# Patient Record
Sex: Male | Born: 1935 | Race: White | Hispanic: No | Smoking: Former smoker
Health system: Southern US, Community
[De-identification: ages and names within clinical notes are randomized; demographics above are authoritative.]

## PROBLEM LIST (undated history)

## (undated) DIAGNOSIS — I429 Cardiomyopathy, unspecified: Secondary | ICD-10-CM

## (undated) DIAGNOSIS — C3490 Malignant neoplasm of unspecified part of unspecified bronchus or lung: Secondary | ICD-10-CM

## (undated) DIAGNOSIS — E7800 Pure hypercholesterolemia, unspecified: Secondary | ICD-10-CM

## (undated) DIAGNOSIS — Z95810 Presence of automatic (implantable) cardiac defibrillator: Secondary | ICD-10-CM

## (undated) DIAGNOSIS — K219 Gastro-esophageal reflux disease without esophagitis: Secondary | ICD-10-CM

## (undated) DIAGNOSIS — N400 Enlarged prostate without lower urinary tract symptoms: Secondary | ICD-10-CM

## (undated) DIAGNOSIS — I509 Heart failure, unspecified: Secondary | ICD-10-CM

## (undated) DIAGNOSIS — J449 Chronic obstructive pulmonary disease, unspecified: Secondary | ICD-10-CM

## (undated) DIAGNOSIS — I38 Endocarditis, valve unspecified: Secondary | ICD-10-CM

## (undated) DIAGNOSIS — Z8679 Personal history of other diseases of the circulatory system: Secondary | ICD-10-CM

## (undated) DIAGNOSIS — G629 Polyneuropathy, unspecified: Secondary | ICD-10-CM

## (undated) DIAGNOSIS — E039 Hypothyroidism, unspecified: Secondary | ICD-10-CM

## (undated) DIAGNOSIS — I5032 Chronic diastolic (congestive) heart failure: Secondary | ICD-10-CM

## (undated) DIAGNOSIS — Z859 Personal history of malignant neoplasm, unspecified: Secondary | ICD-10-CM

## (undated) DIAGNOSIS — I951 Orthostatic hypotension: Secondary | ICD-10-CM

## (undated) HISTORY — DX: Chronic diastolic (congestive) heart failure: I50.32

## (undated) HISTORY — DX: Hypothyroidism, unspecified: E03.9

## (undated) HISTORY — DX: Heart failure, unspecified: I50.9

## (undated) HISTORY — DX: Cardiomyopathy, unspecified: I42.9

## (undated) HISTORY — DX: Endocarditis, valve unspecified: I38

## (undated) HISTORY — DX: Malignant neoplasm of unspecified part of unspecified bronchus or lung: C34.90

## (undated) HISTORY — DX: Benign prostatic hyperplasia without lower urinary tract symptoms: N40.0

## (undated) HISTORY — DX: Orthostatic hypotension: I95.1

## (undated) HISTORY — DX: Personal history of malignant neoplasm, unspecified: Z85.9

## (undated) HISTORY — DX: Pure hypercholesterolemia, unspecified: E78.00

## (undated) HISTORY — DX: Polyneuropathy, unspecified: G62.9

## (undated) HISTORY — DX: Presence of automatic (implantable) cardiac defibrillator: Z95.810

## (undated) HISTORY — PX: APPENDECTOMY: SHX54

## (undated) HISTORY — DX: Gastro-esophageal reflux disease without esophagitis: K21.9

## (undated) HISTORY — DX: Personal history of other diseases of the circulatory system: Z86.79

## (undated) HISTORY — PX: CARDIAC VALVE REPLACEMENT: SHX585

## (undated) HISTORY — PX: OTHER SURGICAL HISTORY: SHX169

## (undated) HISTORY — PX: PORT-A-CATH REMOVAL: SHX5289

---

## 2014-11-02 DIAGNOSIS — K219 Gastro-esophageal reflux disease without esophagitis: Secondary | ICD-10-CM

## 2014-11-02 DIAGNOSIS — Z95810 Presence of automatic (implantable) cardiac defibrillator: Secondary | ICD-10-CM

## 2014-11-02 DIAGNOSIS — I951 Orthostatic hypotension: Secondary | ICD-10-CM | POA: Insufficient documentation

## 2014-11-02 DIAGNOSIS — I5033 Acute on chronic diastolic (congestive) heart failure: Secondary | ICD-10-CM | POA: Insufficient documentation

## 2014-11-02 DIAGNOSIS — I38 Endocarditis, valve unspecified: Secondary | ICD-10-CM | POA: Insufficient documentation

## 2014-11-02 DIAGNOSIS — E7800 Pure hypercholesterolemia, unspecified: Secondary | ICD-10-CM | POA: Insufficient documentation

## 2014-11-02 DIAGNOSIS — I429 Cardiomyopathy, unspecified: Secondary | ICD-10-CM | POA: Insufficient documentation

## 2014-11-02 DIAGNOSIS — G629 Polyneuropathy, unspecified: Secondary | ICD-10-CM | POA: Insufficient documentation

## 2014-11-02 DIAGNOSIS — I10 Essential (primary) hypertension: Secondary | ICD-10-CM | POA: Insufficient documentation

## 2014-11-02 DIAGNOSIS — Z8679 Personal history of other diseases of the circulatory system: Secondary | ICD-10-CM

## 2014-11-02 DIAGNOSIS — E039 Hypothyroidism, unspecified: Secondary | ICD-10-CM

## 2014-11-02 DIAGNOSIS — E119 Type 2 diabetes mellitus without complications: Secondary | ICD-10-CM | POA: Insufficient documentation

## 2014-11-02 DIAGNOSIS — I5032 Chronic diastolic (congestive) heart failure: Secondary | ICD-10-CM

## 2014-11-02 HISTORY — DX: Pure hypercholesterolemia, unspecified: E78.00

## 2014-11-02 HISTORY — DX: Presence of automatic (implantable) cardiac defibrillator: Z95.810

## 2014-11-02 HISTORY — DX: Chronic diastolic (congestive) heart failure: I50.32

## 2014-11-02 HISTORY — DX: Orthostatic hypotension: I95.1

## 2014-11-02 HISTORY — DX: Personal history of other diseases of the circulatory system: Z86.79

## 2014-11-02 HISTORY — DX: Gastro-esophageal reflux disease without esophagitis: K21.9

## 2014-11-02 HISTORY — DX: Polyneuropathy, unspecified: G62.9

## 2014-11-02 HISTORY — DX: Hypothyroidism, unspecified: E03.9

## 2014-11-02 HISTORY — DX: Endocarditis, valve unspecified: I38

## 2014-11-09 ENCOUNTER — Other Ambulatory Visit: Payer: Self-pay | Admitting: Internal Medicine

## 2014-11-09 DIAGNOSIS — Z859 Personal history of malignant neoplasm, unspecified: Secondary | ICD-10-CM

## 2014-11-09 DIAGNOSIS — Z85118 Personal history of other malignant neoplasm of bronchus and lung: Secondary | ICD-10-CM

## 2014-11-09 DIAGNOSIS — R9389 Abnormal findings on diagnostic imaging of other specified body structures: Secondary | ICD-10-CM

## 2014-11-09 HISTORY — DX: Personal history of malignant neoplasm, unspecified: Z85.9

## 2014-11-14 ENCOUNTER — Ambulatory Visit: Admission: RE | Admit: 2014-11-14 | Payer: Medicare Other | Source: Ambulatory Visit

## 2014-11-22 ENCOUNTER — Encounter: Payer: Self-pay | Admitting: *Deleted

## 2014-11-22 ENCOUNTER — Ambulatory Visit: Payer: Self-pay

## 2014-11-22 ENCOUNTER — Ambulatory Visit (INDEPENDENT_AMBULATORY_CARE_PROVIDER_SITE_OTHER): Payer: Medicare Other | Admitting: Obstetrics and Gynecology

## 2014-11-22 VITALS — BP 112/68 | HR 70 | Temp 97.4°F | Resp 16 | Ht 70.0 in | Wt 154.0 lb

## 2014-11-22 DIAGNOSIS — N400 Enlarged prostate without lower urinary tract symptoms: Secondary | ICD-10-CM | POA: Diagnosis not present

## 2014-11-22 DIAGNOSIS — N35119 Postinfective urethral stricture, not elsewhere classified, male, unspecified: Secondary | ICD-10-CM | POA: Diagnosis not present

## 2014-11-22 LAB — URINALYSIS, COMPLETE
BILIRUBIN UA: NEGATIVE
Glucose, UA: NEGATIVE
KETONES UA: NEGATIVE
LEUKOCYTES UA: NEGATIVE
Nitrite, UA: NEGATIVE
PROTEIN UA: NEGATIVE
RBC UA: NEGATIVE
Specific Gravity, UA: 1.015 (ref 1.005–1.030)
Urobilinogen, Ur: 0.2 mg/dL (ref 0.2–1.0)
pH, UA: 5.5 (ref 5.0–7.5)

## 2014-11-22 LAB — MICROSCOPIC EXAMINATION
Bacteria, UA: NONE SEEN
RBC, UA: NONE SEEN /hpf (ref 0–?)
RENAL EPITHEL UA: NONE SEEN /HPF
WBC, UA: NONE SEEN /hpf (ref 0–?)

## 2014-11-22 LAB — BLADDER SCAN AMB NON-IMAGING

## 2014-11-22 MED ORDER — LIDOCAINE HCL 2 % EX GEL
1.0000 "application " | Freq: Once | CUTANEOUS | Status: AC
  Administered 2014-11-22: 1 via URETHRAL

## 2014-11-22 MED ORDER — CIPROFLOXACIN HCL 500 MG PO TABS
500.0000 mg | ORAL_TABLET | Freq: Once | ORAL | Status: AC
  Administered 2014-11-22: 500 mg via ORAL

## 2014-11-22 MED ORDER — SILODOSIN 8 MG PO CAPS: 8.0000 mg | ORAL_CAPSULE | Freq: Every day | ORAL | Status: DC

## 2014-11-22 MED ORDER — CIPROFLOXACIN HCL 500 MG PO TABS: 500.0000 mg | ORAL_TABLET | Freq: Two times a day (BID) | ORAL | Status: DC

## 2014-11-22 NOTE — Progress Notes (Signed)
    Cystoscopy Procedure Note  Patient identification was confirmed, informed consent was obtained, and patient was prepped using Betadine solution.  Lidocaine jelly was administered per urethral meatus.    Preoperative abx where received prior to procedure.     Pre-Procedure: - Inspection reveals a normal caliber ureteral meatus.  Procedure: The flexible cystoscope was introduced without difficulty - Multiple large caliber urethral strictures are present with 1 very small caliber bulbous urethral stricture./lesions are present. I could not reach the prostate because of the stricture. Utilizing filiforms and followers I dilate the stricture to 20 almost 16 [place] and put a 50 [place] coud catheter and with 10 mL in the balloon. We drain well over thousand  milliliters of-  urine -  bladder neck was not seen - : - Patient tolerated the procedure well we sent the patient home with a Foley catheter and will remove it tomorrow and do a fill and spill. Discussed this with her nurse practitioner.

## 2014-11-22 NOTE — Progress Notes (Signed)
11/22/2014 8:42 PM   Russell Howard 18-Apr-1934 [id number]  Referring provider: No referring provider defined for this encounter.  Chief Complaint  Patient presents with  . Benign Prostatic Hypertrophy  . Establish Care    HPI: Patient is a 80 year old male with a history of CHF, lung cancer, diabetes, BPH and urethra stricture. Since today as a referral from his primary care provider to establish care.  He reports significant LUTS symptoms his IPSS score is 24 QOL-mostly dissatisfied. He has a history of recurrent UTIs. His daughter states that they have been becoming increasingly more frequent and he was hospitalized while in [place] for severe urinary tract infections.   PMH: Past Medical History  Diagnosis Date  . CHF (congestive heart failure) ([place])   . Lung cancer ([place])   . Valvular heart disease   . Cardiomyopathy ([place])   . Acquired hypothyroidism 11/02/2014  . Chronic diastolic heart failure ([place]) 11/02/2014  . Diabetes mellitus without complication ([place]) 0/17/5102  . Cardiac defibrillator in place 11/02/2014  . Gastro-esophageal reflux disease without esophagitis 11/02/2014  . History of atrial fibrillation 11/02/2014  . H/O malignant neoplasm 11/09/2014  . Neuropathy ([place]) 11/02/2014  . Orthostasis 11/02/2014  . Pure hypercholesterolemia 11/02/2014  . Heart valve disease 11/02/2014    Surgical History: Past Surgical History  Procedure Laterality Date  . Appendectomy    . Dual icd implant      implantable cardiac defibrillator    Home Medications:    Medication List       This list is accurate as of: 11/22/14 11:59 PM.  Always use your most recent med list.               aspirin EC 81 MG tablet  Take by mouth.     azelastine 0.1 % nasal spray  Commonly known as:  ASTELIN  Place into the nose.     benzonatate 200 MG capsule  Commonly known as:  TESSALON  Take by mouth.     ciprofloxacin 500 MG tablet  Commonly known as:  CIPRO  Take 1 tablet (500 mg  total) by mouth every 12 (twelve) hours.     digoxin 0.125 MG tablet  Commonly known as:  LANOXIN  Take by mouth.     furosemide 20 MG tablet  Commonly known as:  LASIX  Take by mouth.     gabapentin 100 MG capsule  Commonly known as:  NEURONTIN  Take by mouth.     Glucosamine-Chondroitin Caps  Take by mouth.     hydrocortisone 10 MG tablet  Commonly known as:  CORTEF  Take by mouth.     levothyroxine 50 MCG tablet  Commonly known as:  SYNTHROID, LEVOTHROID  Take by mouth.     Lutein 6 MG Caps  Take by mouth.     pantoprazole 40 MG tablet  Commonly known as:  PROTONIX  Take by mouth.     potassium chloride 10 MEQ CR capsule  Commonly known as:  MICRO-K  Take by mouth.     pyridostigmine 60 MG tablet  Commonly known as:  MESTINON  Take by mouth.     SAW PALMETTO-PUMPKIN SEED OIL PO  Take by mouth.     silodosin 8 MG Caps capsule  Commonly known as:  RAPAFLO  Take 1 capsule (8 mg total) by mouth daily with breakfast.     simvastatin 20 MG tablet  Commonly known as:  ZOCOR  Take by mouth.     SM CALCIUM  500/VITAMIN D3 500-400 MG-UNIT tablet  Generic drug:  calcium-vitamin D  Take by mouth.     TH CO Q-10 100 MG capsule  Generic drug:  Coenzyme Q10  Take by mouth.     tiotropium 18 MCG inhalation capsule  Commonly known as:  [place] into inhaler and inhale.     vitamin B-12 500 MCG tablet  Commonly known as:  CYANOCOBALAMIN  Take by mouth.     vitamin C 1000 MG tablet  Take by mouth.     vitamin E 400 UNIT capsule  Take by mouth.        Allergies:  Allergies  Allergen Reactions  . Penicillin G Hives  . Sulfa Antibiotics Rash    Family History: No family history on file.  Social History:  reports that he quit smoking about 33 years ago. His smoking use included Cigarettes. He does not have any smokeless tobacco history on file. He reports that he does not drink alcohol or use illicit drugs.  ROS: UROLOGY Frequent Urination?:  Yes Hard to postpone urination?: Yes Burning/pain with urination?: Yes Get up at night to urinate?: Yes Leakage of urine?: No Urine stream starts and stops?: Yes Trouble starting stream?: Yes Do you have to strain to urinate?: Yes Blood in urine?: No Urinary tract infection?: Yes Sexually transmitted disease?: No Injury to kidneys or bladder?: No Painful intercourse?: No Weak stream?: Yes Erection problems?: No Penile pain?: No  Gastrointestinal Nausea?: No Vomiting?: No Indigestion/heartburn?: Yes Diarrhea?: No Constipation?: No  Constitutional Fever: No Night sweats?: No Weight loss?: No Fatigue?: No  Skin Skin rash/lesions?: No Itching?: No  Eyes Blurred vision?: No Double vision?: No  Ears/Nose/Throat Sore throat?: No Sinus problems?: Yes  Hematologic/Lymphatic Swollen glands?: No Easy bruising?: No  Cardiovascular Leg swelling?: Yes Chest pain?: Yes  Respiratory Cough?: No Shortness of breath?: No  Endocrine Excessive thirst?: No  Musculoskeletal Back pain?: No Joint pain?: Yes  Neurological Headaches?: No Dizziness?: No  Psychologic Depression?: No Anxiety?: No  Physical Exam: BP 112/68 mmHg  Pulse 70  Temp(Src) 97.4 F (36.3 C)  Resp 16  Ht '5\' 10"'$  (1.778 m)  Wt 154 lb (69.854 kg)  BMI 22.10 kg/m2  Constitutional:  Alert and oriented, No acute distress. HEENT:  AT, moist mucus membranes.  Trachea midline, no masses. Cardiovascular: No clubbing, cyanosis, or edema. Respiratory: Normal respiratory effort, no increased work of breathing. GI: Abdomen is soft, nontender, nondistended, no abdominal masses GU: No CVA tenderness.  DRE: +2-3 prostate, smooth, no nodules Skin: No rashes, bruises or suspicious lesions. Lymph: No cervical or inguinal adenopathy. Neurologic: Grossly intact, no focal deficits, moving all 4 extremities. Psychiatric: Normal mood and affect.  Laboratory Data:   Urinalysis    Component Value  Date/Time   GLUCOSEU Negative 11/22/2014 1334   BILIRUBINUR Negative 11/22/2014 1334   NITRITE Negative 11/22/2014 1334   LEUKOCYTESUR Negative 11/22/2014 1334    Pertinent Imaging:  Assessment & Plan:    1. BPH (benign prostatic hyperplasia)- Prostate enlarged on DRE today. We will hold off on prescribing any medications for his BPH until his urethral stricture is further evaluated and resolved. - Urinalysis, Complete - BLADDER SCAN AMB NON-IMAGING  2. Urinary retention- Patient was noted to have a PVR of 800 mL on bladder scan. Foley catheter placement was attempted and ultimately unsuccessful by myself and Dr. Elnoria Howard. Patient was taken to the procedure room by Dr. Elnoria Howard for dilation of stricture and Foley catheter placement. He was placed  on antibiotics and instructed to follow up in 24 hours for 14 trial.  Return for f/u 24hrs for voiding trial nurse visit; f/u 1 week with me for PVR.  These notes generated with voice recognition software. I apologize for typographical errors.  Herbert Moors, [facility] [address] [place], [place] [address] [phone]

## 2014-11-23 ENCOUNTER — Ambulatory Visit (INDEPENDENT_AMBULATORY_CARE_PROVIDER_SITE_OTHER): Payer: Medicare Other

## 2014-11-23 DIAGNOSIS — N35119 Postinfective urethral stricture, not elsewhere classified, male, unspecified: Secondary | ICD-10-CM

## 2014-11-23 DIAGNOSIS — R339 Retention of urine, unspecified: Secondary | ICD-10-CM

## 2014-11-23 NOTE — Progress Notes (Signed)
Fill and Pull Catheter Removal  Patient is present today for a catheter removal.  Patient was cleaned and prepped in a sterile fashion 369m of sterile water/ saline was instilled into the bladder when the patient felt the urge to urinate. 162mof water was then drained from the balloon.  A 16FR coude foley cath was removed from the bladder no complications were noted .  Patient as then given some time to void on their own.  Patient cannot void.  Preformed by: ChToniann FailLPN   Simple Catheter Placement  Due to failing voiding trial patient is present today for a foley cath placement.  Patient was cleaned and prepped in a sterile fashion with betadine and lidocaine jelly 2% was instilled into the urethra.  A 14 FR foley catheter was inserted, urine return was noted  105murine was clear in color.  The balloon was filled with 10cc of sterile water.  A leg bag was attached for drainage. Patient was also given a night bag to take home and was given instruction on how to change from one bag to another.  Patient was given instruction on proper catheter care.  Patient tolerated well, no complications were noted   Preformed by: CheToniann FailPN  Additional notes/ Follow up: Per Dr. BudPilar Jarvis will f/u in 2 weeks with him.

## 2014-11-28 ENCOUNTER — Telehealth: Payer: Self-pay

## 2014-11-28 NOTE — Telephone Encounter (Signed)
Pt daughter in-law, [name], called the after hours triage nurse stating the pt is having extreme lower abd pain and the foley is not draining. Per [name] triage nurse advised them to go to the ER. [name] stated she took pt to [facility] ER where they were seen rather quickly and pt foley was flushed. [name] stated ER physicians showed her how to flush foley incase pain and foley not draining occurred again. Nurse inquired about pt current well being and [name] stated pt is doing much better and comfortable since episode. Nurse reinforced with [name] to give [place] a call should pt develop pain again prior to next office visit or any other concerns. [name] voiced understanding.

## 2014-11-30 ENCOUNTER — Telehealth: Payer: Self-pay | Admitting: Obstetrics and Gynecology

## 2014-11-30 NOTE — Telephone Encounter (Signed)
Pt's daughter-in-law, [name], returned Chelsea's call.  Stated that the urine culture Mr. Bun had a negative growth.  Please call the medical records office if you need to request a copy of the report at [phone].

## 2014-11-30 NOTE — Telephone Encounter (Signed)
Spoke [name] in reference to pt Russell Howard. Nurse advised [name] to call [facility] ER and requested Russell Howard results and then have them fax them to [facility]. [name] voiced understanding. Nurse also made [name] aware if she is not able to get the results, nurse would attempt to get in touch with [facility].

## 2014-11-30 NOTE — Telephone Encounter (Signed)
Mr. Marchio was seen in the ER Saturday morning & they said he had the startings of a UTI, since he is already on antibiotics to continue those.  Chelsea told daughter-in-law, [name], to call if they have not heard from [facility] with results of the culture which was done Saturday morning & they have not.  Please call [name] at [phone] or [phone].

## 2014-12-07 ENCOUNTER — Ambulatory Visit (INDEPENDENT_AMBULATORY_CARE_PROVIDER_SITE_OTHER): Payer: Medicare Other | Admitting: Urology

## 2014-12-07 VITALS — BP 115/66 | HR 70 | Ht 70.0 in | Wt 149.6 lb

## 2014-12-07 DIAGNOSIS — R339 Retention of urine, unspecified: Secondary | ICD-10-CM

## 2014-12-07 DIAGNOSIS — N359 Urethral stricture, unspecified: Secondary | ICD-10-CM

## 2014-12-07 DIAGNOSIS — N390 Urinary tract infection, site not specified: Secondary | ICD-10-CM

## 2014-12-07 NOTE — Progress Notes (Signed)
Fill and Pull Catheter Removal  Patient is present today for a catheter removal.  Patient was cleaned and prepped in a sterile fashion 310m of sterile water/ saline was instilled into the bladder when the patient felt the urge to urinate. 123mof water was then drained from the balloon.  A 14FR foley cath was removed from the bladder no complications were noted .  Patient as then given some time to void on their own.  Patient can void  12536mn their own after some time.  Patient tolerated well.  Preformed by: SarFonnie JarvisMA  Follow up/ Additional notes: Per Dr. BudPilar Jarvistient to be taught CIC  Continuous Intermittent Catheterization  Due to urethral stricture patient is present today for a teaching of self I & O Catheterization. Patient was given detailed verbal and printed instructions of self catheterization. Patient was cleaned and prepped in a sterile fashion.  With instruction and assistance patient inserted a 14FR and urine return was noted 200 ml, urine was clear yellow in color. Patient tolerated well, no complications were noted Patient was given a sample bag with supplies to take home.  Instructions were given per Dr. BudPilar Jarvisr patient to cath 2 times daily. Patient is to keep a bladder diary and record residuals.  An order was placed with [facility] for catheters to be sent to the patient's home. Patient is to follow up in one month.  Preformed by: SarFonnie JarvisMA  Additional Notes: patient's daughter was also present and instructed on how to cath

## 2014-12-07 NOTE — Progress Notes (Signed)
12/07/2014 10:47 AM   Russell Howard 07-12-1934 [id number]  Referring provider: [name], MD [facility] [place], [place] [address]  Chief Complaint  Patient presents with  . Urinary Retention    voiding trial    HPI:  He reports significant LUTS symptoms his IPSS score is 24 QOL-mostly dissatisfied. He has a history of recurrent UTIs. His daughter states that they have been becoming increasingly more frequent and he was hospitalized while in [place] for severe urinary tract infections.  Interval history: The patient at his last appointment was dilated with filiforms and followers and 18 French Foley catheter placed. He returns today for trial of void.   PMH: Past Medical History  Diagnosis Date  . CHF (congestive heart failure) ([place])   . Lung cancer ([place])   . Valvular heart disease   . Cardiomyopathy ([place])   . Acquired hypothyroidism 11/02/2014  . Chronic diastolic heart failure ([place]) 11/02/2014  . Diabetes mellitus without complication ([place]) 0/73/7106  . Cardiac defibrillator in place 11/02/2014  . Gastro-esophageal reflux disease without esophagitis 11/02/2014  . History of atrial fibrillation 11/02/2014  . H/O malignant neoplasm 11/09/2014  . Neuropathy ([place]) 11/02/2014  . Orthostasis 11/02/2014  . Pure hypercholesterolemia 11/02/2014  . Heart valve disease 11/02/2014    Surgical History: Past Surgical History  Procedure Laterality Date  . Appendectomy    . Dual icd implant      implantable cardiac defibrillator    Home Medications:    Medication List       This list is accurate as of: 12/07/14 10:47 AM.  Always use your most recent med list.               aspirin EC 81 MG tablet  Take by mouth.     azelastine 0.1 % nasal spray  Commonly known as:  ASTELIN  Place into the nose.     benzonatate 200 MG capsule  Commonly known as:  TESSALON  Take by mouth.     cephALEXin 500 MG capsule  Commonly known as:  KEFLEX    Take by mouth.     digoxin 0.125 MG tablet  Commonly known as:  LANOXIN  Take by mouth.     furosemide 20 MG tablet  Commonly known as:  LASIX  Take by mouth.     gabapentin 100 MG capsule  Commonly known as:  NEURONTIN  Take by mouth.     Glucosamine-Chondroitin Caps  Take by mouth.     hydrocortisone 10 MG tablet  Commonly known as:  CORTEF  Take by mouth.     levothyroxine 50 MCG tablet  Commonly known as:  SYNTHROID, LEVOTHROID  Take by mouth.     Lutein 6 MG Caps  Take by mouth.     pantoprazole 40 MG tablet  Commonly known as:  PROTONIX  Take by mouth.     potassium chloride 10 MEQ CR capsule  Commonly known as:  MICRO-K  Take by mouth.     pyridostigmine 60 MG tablet  Commonly known as:  MESTINON  Take by mouth.     SAW PALMETTO-PUMPKIN SEED OIL PO  Take by mouth.     silodosin 8 MG Caps capsule  Commonly known as:  RAPAFLO  Take 1 capsule (8 mg total) by mouth daily with breakfast.     simvastatin 20 MG tablet  Commonly known as:  ZOCOR  Take by mouth.     SM CALCIUM 500/VITAMIN D3  500-400 MG-UNIT tablet  Generic drug:  calcium-vitamin D  Take by mouth.     TH CO Q-10 100 MG capsule  Generic drug:  Coenzyme Q10  Take by mouth.     tiotropium 18 MCG inhalation capsule  Commonly known as:  [place] into inhaler and inhale.     vitamin B-12 500 MCG tablet  Commonly known as:  CYANOCOBALAMIN  Take by mouth.     vitamin C 1000 MG tablet  Take by mouth.     vitamin E 400 UNIT capsule  Take by mouth.        Allergies:  Allergies  Allergen Reactions  . Penicillin G Hives  . Sulfa Antibiotics Rash    Family History: No family history on file.  Social History:  reports that he quit smoking about 33 years ago. His smoking use included Cigarettes. He does not have any smokeless tobacco history on file. He reports that he does not drink alcohol or use illicit drugs.  ROS:                                         Physical Exam: BP 115/66 mmHg  Pulse 70  Ht '5\' 10"'$  (1.778 m)  Wt 149 lb 9.6 oz (67.858 kg)  BMI 21.47 kg/m2  Constitutional:  Alert and oriented, No acute distress. HEENT: [place] AT, moist mucus membranes.  Trachea midline, no masses. Cardiovascular: No clubbing, cyanosis, or edema. Respiratory: Normal respiratory effort, no increased work of breathing. GI: Abdomen is soft, nontender, nondistended, no abdominal masses GU: No CVA tenderness.  Skin: No rashes, bruises or suspicious lesions. Lymph: No cervical or inguinal adenopathy. Neurologic: Grossly intact, no focal deficits, moving all 4 extremities. Psychiatric: Normal mood and affect.  Laboratory Data: No results found for: WBC, HGB, HCT, MCV, PLT  No results found for: CREATININE  No results found for: PSA  No results found for: TESTOSTERONE  No results found for: HGBA1C  Urinalysis    Component Value Date/Time   GLUCOSEU Negative 11/22/2014 1334   BILIRUBINUR Negative 11/22/2014 1334   NITRITE Negative 11/22/2014 1334   LEUKOCYTESUR Negative 11/22/2014 1334      Assessment & Plan:   1. Urinary retention The patient continues to fail his trial of void. He will be taught clean intermittent catheterization. He was instructed to start at twice a day and void in between. He will also try to avoid prior to catheterization so that the numbers and a voiding diary will be PVRs. He was instructed to increase the frequency of catheterization if the vomiting get a greater than 500 cc. He'll follow-up in one month's time to assess his progress. He'll keep a voiding diary.   2. Recurrent UTI As above  3. BPH Continue Rapaflo. As above.  There are no diagnoses linked to this encounter.  Return in about 4 weeks (around 01/04/2015).  Nickie Retort, MD  [facility] [address], [place], [place] [address] [phone]

## 2015-01-04 ENCOUNTER — Ambulatory Visit (INDEPENDENT_AMBULATORY_CARE_PROVIDER_SITE_OTHER): Payer: Medicare Other | Admitting: Urology

## 2015-01-04 ENCOUNTER — Encounter: Payer: Self-pay | Admitting: Urology

## 2015-01-04 VITALS — BP 114/73 | HR 71 | Ht 70.0 in | Wt 151.0 lb

## 2015-01-04 DIAGNOSIS — R339 Retention of urine, unspecified: Secondary | ICD-10-CM

## 2015-01-04 NOTE — Progress Notes (Signed)
Catheter Form filled out and faxed to [facility] [phone] w/ necessary changes.

## 2015-01-04 NOTE — Progress Notes (Signed)
01/04/2015 12:03 PM   Russell Howard [birth year] [id number]  Referring provider: [name], MD [facility] [place], [place] [address]  Chief Complaint  Patient presents with  . Follow-up    urethral stricture,     HPI: He reports significant LUTS symptoms his IPSS score is 24 QOL-mostly dissatisfied. He has a history of recurrent UTIs. His daughter states that they have been becoming increasingly more frequent and he was hospitalized while in [place] for severe urinary tract infections.  The patient ended up having urethral dilations with filiforms and followers, and an 14 French Foley catheter was placed. He failed his trial of void. He was taught how to perform CIC.  He returns today so we can check his progress with CSC as well as go over the volumes that he was draining from his bladder.  The patient has been performing CIC twice a day. He voids prior to CIC and is able to evacuate 100 to 300 cc. However he is constantly getting post void catheterizations ranging between 300 to 400 cc. He is not having difficulty with the CIC.  He has not had any further urinary tract infections since he began CIC.  PMH: Past Medical History  Diagnosis Date  . CHF (congestive heart failure) ([place])   . Lung cancer ([place])   . Valvular heart disease   . Cardiomyopathy ([place])   . Acquired hypothyroidism 11/02/2014  . Chronic diastolic heart failure ([place]) 11/02/2014  . Diabetes mellitus without complication ([place]) 08/06/5007  . Cardiac defibrillator in place 11/02/2014  . Gastro-esophageal reflux disease without esophagitis 11/02/2014  . History of atrial fibrillation 11/02/2014  . H/O malignant neoplasm 11/09/2014  . Neuropathy ([place]) 11/02/2014  . Orthostasis 11/02/2014  . Pure hypercholesterolemia 11/02/2014  . Heart valve disease 11/02/2014    Surgical History: Past Surgical History  Procedure Laterality Date  . Appendectomy    . Dual icd implant      implantable  cardiac defibrillator    Home Medications:    Medication List       This list is accurate as of: 01/04/15 12:03 PM.  Always use your most recent med list.               aspirin EC 81 MG tablet  Take by mouth.     azelastine 0.1 % nasal spray  Commonly known as:  ASTELIN  Place into the nose.     benzonatate 200 MG capsule  Commonly known as:  TESSALON  Take by mouth.     digoxin 0.125 MG tablet  Commonly known as:  LANOXIN  Take by mouth.     furosemide 20 MG tablet  Commonly known as:  LASIX  Take by mouth.     gabapentin 100 MG capsule  Commonly known as:  NEURONTIN     gabapentin 300 MG capsule  Commonly known as:  NEURONTIN     Glucosamine-Chondroitin Caps  Take by mouth.     hydrocortisone 10 MG tablet  Commonly known as:  CORTEF  Take by mouth.     levothyroxine 50 MCG tablet  Commonly known as:  SYNTHROID, LEVOTHROID  Take by mouth.     Lutein 6 MG Caps  Take by mouth.     pantoprazole 40 MG tablet  Commonly known as:  PROTONIX  Take by mouth.     potassium chloride 10 MEQ CR capsule  Commonly known as:  MICRO-K  Take by mouth.  pyridostigmine 60 MG tablet  Commonly known as:  MESTINON  Take by mouth.     SAW PALMETTO-PUMPKIN SEED OIL PO  Take by mouth.     silodosin 8 MG Caps capsule  Commonly known as:  RAPAFLO  Take 1 capsule (8 mg total) by mouth daily with breakfast.     simvastatin 20 MG tablet  Commonly known as:  ZOCOR  Take by mouth.     SM CALCIUM 500/VITAMIN D3 500-400 MG-UNIT tablet  Generic drug:  calcium-vitamin D  Take by mouth.     TH CO Q-10 100 MG capsule  Generic drug:  Coenzyme Q10  Take by mouth.     tiotropium 18 MCG inhalation capsule  Commonly known as:  Ione into inhaler and inhale.     vitamin B-12 500 MCG tablet  Commonly known as:  CYANOCOBALAMIN  Take by mouth.     vitamin C 1000 MG tablet  Take by mouth.     vitamin E 400 UNIT capsule  Take by mouth.        Allergies:    Allergies  Allergen Reactions  . Penicillin G Hives  . Sulfa Antibiotics Rash    Family History: No family history on file.  Social History:  reports that he quit smoking about 33 years ago. His smoking use included Cigarettes. He does not have any smokeless tobacco history on file. He reports that he does not drink alcohol or use illicit drugs.  ROS: UROLOGY Frequent Urination?: No Hard to postpone urination?: No Burning/pain with urination?: No Get up at night to urinate?: No Leakage of urine?: No Urine stream starts and stops?: No Trouble starting stream?: No Do you have to strain to urinate?: No Blood in urine?: No Urinary tract infection?: No Sexually transmitted disease?: No Injury to kidneys or bladder?: No Painful intercourse?: No Weak stream?: No Erection problems?: No Penile pain?: No  Gastrointestinal Nausea?: No Vomiting?: No Indigestion/heartburn?: No Diarrhea?: No Constipation?: No  Constitutional Fever: No Night sweats?: No Weight loss?: No Fatigue?: Yes  Skin Skin rash/lesions?: No Itching?: No  Eyes Blurred vision?: No Double vision?: No  Ears/Nose/Throat Sore throat?: No Sinus problems?: No  Hematologic/Lymphatic Swollen glands?: No Easy bruising?: No  Cardiovascular Leg swelling?: No Chest pain?: No  Respiratory Cough?: Yes Shortness of breath?: Yes  Endocrine Excessive thirst?: No  Musculoskeletal Back pain?: No Joint pain?: No  Neurological Headaches?: No Dizziness?: No  Psychologic Depression?: No Anxiety?: No  Physical Exam: BP 114/73 mmHg  Pulse 71  Ht '5\' 10"'$  (1.778 m)  Wt 151 lb (68.493 kg)  BMI 21.67 kg/m2  Constitutional:  Alert and oriented, No acute distress. HEENT: [place] AT, moist mucus membranes.  Trachea midline, no masses. Cardiovascular: No clubbing, cyanosis, or edema. Respiratory: Normal respiratory effort, no increased work of breathing. GI: Abdomen is soft, nontender, nondistended, no  abdominal masses GU: No CVA tenderness.  Skin: No rashes, bruises or suspicious lesions. Lymph: No cervical or inguinal adenopathy. Neurologic: Grossly intact, no focal deficits, moving all 4 extremities. Psychiatric: Normal mood and affect.  Laboratory Data: No results found for: WBC, HGB, HCT, MCV, PLT  No results found for: CREATININE  No results found for: PSA  No results found for: TESTOSTERONE  No results found for: HGBA1C  Urinalysis    Component Value Date/Time   GLUCOSEU Negative 11/22/2014 1334   BILIRUBINUR Negative 11/22/2014 1334   NITRITE Negative 11/22/2014 1334   LEUKOCYTESUR Negative 11/22/2014 1334     Assessment &  Plan:    1. Urinary retention The patient will continue clean intermittent catheterization twice per day for lifetime. He no longer needs to keep a voiding log as we have a months worth the data. He will follow-up in 6 months to check on his progress.  2. Recurrent UTI As above. He will alert the office and he gets a symptomatic urinary tract infection prior to his follow-up.   Return in about 6 months (around 07/05/2015).  Nickie Retort, MD  [facility] [address] [place], [place] [address] [phone]

## 2015-01-19 ENCOUNTER — Ambulatory Visit
Admission: RE | Admit: 2015-01-19 | Discharge: 2015-01-19 | Disposition: A | Discharge status: Home / Self Care | Payer: Medicare Other | Source: Ambulatory Visit | Attending: Internal Medicine | Admitting: Internal Medicine

## 2015-01-19 DIAGNOSIS — J90 Pleural effusion, not elsewhere classified: Secondary | ICD-10-CM | POA: Diagnosis not present

## 2015-01-19 DIAGNOSIS — R938 Abnormal findings on diagnostic imaging of other specified body structures: Secondary | ICD-10-CM | POA: Diagnosis present

## 2015-01-19 DIAGNOSIS — Z85118 Personal history of other malignant neoplasm of bronchus and lung: Secondary | ICD-10-CM | POA: Insufficient documentation

## 2015-01-19 DIAGNOSIS — R9389 Abnormal findings on diagnostic imaging of other specified body structures: Secondary | ICD-10-CM

## 2015-01-19 DIAGNOSIS — I2510 Atherosclerotic heart disease of native coronary artery without angina pectoris: Secondary | ICD-10-CM | POA: Diagnosis not present

## 2015-01-19 DIAGNOSIS — K8020 Calculus of gallbladder without cholecystitis without obstruction: Secondary | ICD-10-CM | POA: Insufficient documentation

## 2015-01-19 DIAGNOSIS — R918 Other nonspecific abnormal finding of lung field: Secondary | ICD-10-CM | POA: Diagnosis not present

## 2015-02-14 ENCOUNTER — Ambulatory Visit
Admission: RE | Admit: 2015-02-14 | Discharge: 2015-02-14 | Disposition: A | Discharge status: Home / Self Care | Payer: Medicare Other | Source: Ambulatory Visit | Attending: Internal Medicine | Admitting: Internal Medicine

## 2015-02-14 ENCOUNTER — Other Ambulatory Visit: Payer: Self-pay | Admitting: Oncology

## 2015-02-14 ENCOUNTER — Inpatient Hospital Stay: Payer: Medicare Other

## 2015-02-14 ENCOUNTER — Inpatient Hospital Stay: Payer: Medicare Other | Attending: Internal Medicine | Admitting: Internal Medicine

## 2015-02-14 ENCOUNTER — Encounter: Payer: Self-pay | Admitting: Internal Medicine

## 2015-02-14 ENCOUNTER — Other Ambulatory Visit: Payer: Self-pay | Admitting: *Deleted

## 2015-02-14 DIAGNOSIS — E119 Type 2 diabetes mellitus without complications: Secondary | ICD-10-CM | POA: Insufficient documentation

## 2015-02-14 DIAGNOSIS — J90 Pleural effusion, not elsewhere classified: Secondary | ICD-10-CM

## 2015-02-14 DIAGNOSIS — E039 Hypothyroidism, unspecified: Secondary | ICD-10-CM | POA: Diagnosis not present

## 2015-02-14 DIAGNOSIS — Z79899 Other long term (current) drug therapy: Secondary | ICD-10-CM | POA: Diagnosis not present

## 2015-02-14 DIAGNOSIS — Z87891 Personal history of nicotine dependence: Secondary | ICD-10-CM | POA: Diagnosis not present

## 2015-02-14 DIAGNOSIS — J940 Chylous effusion: Secondary | ICD-10-CM | POA: Diagnosis not present

## 2015-02-14 DIAGNOSIS — Z7982 Long term (current) use of aspirin: Secondary | ICD-10-CM | POA: Insufficient documentation

## 2015-02-14 DIAGNOSIS — I2510 Atherosclerotic heart disease of native coronary artery without angina pectoris: Secondary | ICD-10-CM | POA: Diagnosis not present

## 2015-02-14 DIAGNOSIS — I5032 Chronic diastolic (congestive) heart failure: Secondary | ICD-10-CM | POA: Insufficient documentation

## 2015-02-14 DIAGNOSIS — Z85118 Personal history of other malignant neoplasm of bronchus and lung: Secondary | ICD-10-CM | POA: Insufficient documentation

## 2015-02-14 DIAGNOSIS — Z923 Personal history of irradiation: Secondary | ICD-10-CM | POA: Insufficient documentation

## 2015-02-14 DIAGNOSIS — I4891 Unspecified atrial fibrillation: Secondary | ICD-10-CM | POA: Diagnosis not present

## 2015-02-14 DIAGNOSIS — N400 Enlarged prostate without lower urinary tract symptoms: Secondary | ICD-10-CM | POA: Insufficient documentation

## 2015-02-14 DIAGNOSIS — E7800 Pure hypercholesterolemia, unspecified: Secondary | ICD-10-CM | POA: Diagnosis not present

## 2015-02-14 DIAGNOSIS — K219 Gastro-esophageal reflux disease without esophagitis: Secondary | ICD-10-CM | POA: Insufficient documentation

## 2015-02-14 DIAGNOSIS — Z95810 Presence of automatic (implantable) cardiac defibrillator: Secondary | ICD-10-CM | POA: Diagnosis not present

## 2015-02-14 DIAGNOSIS — R0602 Shortness of breath: Secondary | ICD-10-CM | POA: Diagnosis not present

## 2015-02-14 DIAGNOSIS — Z9221 Personal history of antineoplastic chemotherapy: Secondary | ICD-10-CM | POA: Insufficient documentation

## 2015-02-14 DIAGNOSIS — I429 Cardiomyopathy, unspecified: Secondary | ICD-10-CM | POA: Diagnosis not present

## 2015-02-14 LAB — CBC WITH DIFFERENTIAL/PLATELET
Basophils Absolute: 0.1 10*3/uL (ref 0–0.1)
Basophils Relative: 1 %
EOS ABS: 0.4 10*3/uL (ref 0–0.7)
Eosinophils Relative: 3 %
HEMATOCRIT: 44.1 % (ref 40.0–52.0)
HEMOGLOBIN: 14.2 g/dL (ref 13.0–18.0)
LYMPHS ABS: 0.5 10*3/uL — AB (ref 1.0–3.6)
Lymphocytes Relative: 4 %
MCH: 27.8 pg (ref 26.0–34.0)
MCHC: 32.2 g/dL (ref 32.0–36.0)
MCV: 86.3 fL (ref 80.0–100.0)
MONOS PCT: 5 %
Monocytes Absolute: 0.6 10*3/uL (ref 0.2–1.0)
NEUTROS ABS: 11.8 10*3/uL — AB (ref 1.4–6.5)
NEUTROS PCT: 87 %
Platelets: 257 10*3/uL (ref 150–440)
RBC: 5.11 MIL/uL (ref 4.40–5.90)
RDW: 16.4 % — ABNORMAL HIGH (ref 11.5–14.5)
WBC: 13.4 10*3/uL — ABNORMAL HIGH (ref 3.8–10.6)

## 2015-02-14 LAB — LACTATE DEHYDROGENASE, PLEURAL OR PERITONEAL FLUID: LD FL: 146 U/L — AB (ref 3–23)

## 2015-02-14 LAB — BODY FLUID CELL COUNT WITH DIFFERENTIAL
EOS FL: 0 %
LYMPHS FL: 94 %
MONOCYTE-MACROPHAGE-SEROUS FLUID: 5 %
NEUTROPHIL FLUID: 1 %
OTHER CELLS FL: 0 %
Total Nucleated Cell Count, Fluid: 103 cu mm

## 2015-02-14 LAB — COMPREHENSIVE METABOLIC PANEL
ALT: 17 U/L (ref 17–63)
ANION GAP: 7 (ref 5–15)
AST: 22 U/L (ref 15–41)
Albumin: 4 g/dL (ref 3.5–5.0)
Alkaline Phosphatase: 100 U/L (ref 38–126)
BUN: 19 mg/dL (ref 6–20)
CALCIUM: 9.5 mg/dL (ref 8.9–10.3)
CHLORIDE: 101 mmol/L (ref 101–111)
CO2: 27 mmol/L (ref 22–32)
CREATININE: 0.86 mg/dL (ref 0.61–1.24)
Glucose, Bld: 100 mg/dL — ABNORMAL HIGH (ref 65–99)
Potassium: 3.8 mmol/L (ref 3.5–5.1)
SODIUM: 135 mmol/L (ref 135–145)
Total Bilirubin: 0.4 mg/dL (ref 0.3–1.2)
Total Protein: 8.6 g/dL — ABNORMAL HIGH (ref 6.5–8.1)

## 2015-02-14 LAB — PROTEIN, BODY FLUID: TOTAL PROTEIN, FLUID: 4.4 g/dL

## 2015-02-14 LAB — APTT: APTT: 29 s (ref 24–36)

## 2015-02-14 LAB — GLUCOSE, SEROUS FLUID: GLUCOSE FL: 81 mg/dL

## 2015-02-14 LAB — PROTIME-INR
INR: 1.03
PROTHROMBIN TIME: 13.7 s (ref 11.4–15.0)

## 2015-02-14 NOTE — Procedures (Signed)
Interventional Radiology Procedure Note  Procedure: US guided right thoracentesis .  Complications: None Recommendations:  - Ok to shower tomorrow - Do not submerge for 7 days - Routine care - Follow up studies for potential infection vs chylous effusion   Signed,  Dulcy Fanny. Earleen Newport, DO

## 2015-02-14 NOTE — Progress Notes (Signed)
[facility] NOTE  Patient Care Team: [name], MD as PCP - General (Internal Medicine)  CHIEF COMPLAINTS/PURPOSE OF CONSULTATION: recurrent Lung cancer  # MAY 2015 LUNG Cancer [[place], [place]; Dr.Gandhok]- awaiting records  # Right sided Partially loculated complex pleural effusion [? Chylous]  # Bronchiectasis; bil lung fibrosis [R>L]; Bioprosthetic valve CHF/CAD; s/p ppm   HISTORY OF PRESENTING ILLNESS:  Russell Howard 81 y.o.  male with prior history of lung cancer diagnosed in May 2015 in [place] has recently moved to [place] to live with his daughter-in-law/Son. He was noted to have worsening shortness of breath; hence had a CT scan December 2016- that unfortunately showed pleural effusion on the right side/ and also possible recurrence. He has been referred to [place] for further evaluation.  Patient is unaware of stage; or the histology of lung cancer. He states to have received chemoradiation- which made him sicker and hence stopped chemotherapy. He finished radiation September 2015. Patient was felt not to be a candidate for any further therapies; hence treatment were discontinued.  Patient more recently was having worsening shortness of breath especially with exertion; chronic cough again getting worse. No hemoptysis. Poor appetite. No headaches. Feeling weak all over. No nausea no vomiting. No pain.    ROS: A complete 10 point review of system is done which is negative except mentioned above in history of present illness  MEDICAL HISTORY:  Past Medical History  Diagnosis Date  . CHF (congestive heart failure) ([place])   . Lung cancer ([place])   . Valvular heart disease   . Cardiomyopathy ([place])   . Acquired hypothyroidism 11/02/2014  . Chronic diastolic heart failure ([place]) 11/02/2014  . Diabetes mellitus without complication ([place]) 5/42/7062  . Cardiac defibrillator in place 11/02/2014  . Gastro-esophageal reflux disease without esophagitis 11/02/2014  .  History of atrial fibrillation 11/02/2014  . H/O malignant neoplasm 11/09/2014  . Neuropathy ([place]) 11/02/2014  . Orthostasis 11/02/2014  . Pure hypercholesterolemia 11/02/2014  . Heart valve disease 11/02/2014  . BPH (benign prostatic hyperplasia)     SURGICAL HISTORY: Past Surgical History  Procedure Laterality Date  . Appendectomy    . Dual icd implant  2007/2009    implantable cardiac defibrillator  . Port-a-cath removal      SOCIAL HISTORY: Social History   Social History  . Marital Status: Widowed    Spouse Name: N/A  . Number of Children: N/A  . Years of Education: N/A   Occupational History  . Not on file.   Social History Main Topics  . Smoking status: Former Smoker    Types: Cigarettes    Quit date: 11/21/1981  . Smokeless tobacco: Never Used  . Alcohol Use: No  . Drug Use: No  . Sexual Activity: Not on file   Other Topics Concern  . Not on file   Social History Narrative    FAMILY HISTORY: Family History  Problem Relation Age of Onset  . Hypertension    . Heart disease      ALLERGIES:  is allergic to penicillin g and sulfa antibiotics.  MEDICATIONS:  Current Outpatient Prescriptions  Medication Sig Dispense Refill  . Ascorbic Acid (VITAMIN C) 1000 MG tablet Take by mouth.    Marland Kitchen aspirin EC 81 MG tablet Take by mouth.    . calcium-vitamin D (SM CALCIUM 500/VITAMIN D3) 500-400 MG-UNIT tablet Take by mouth.    . Coenzyme Q10 (TH CO Q-10) 100 MG capsule Take by mouth.    Marland Kitchen  digoxin (LANOXIN) 0.125 MG tablet Take 0.125 mg by mouth daily.     . furosemide (LASIX) 20 MG tablet Take 20 mg by mouth daily.     Marland Kitchen gabapentin (NEURONTIN) 300 MG capsule Take 300 mg by mouth 3 (three) times daily.     . Glucosamine-Chondroit-Vit C-Mn (GLUCOSAMINE-CHONDROITIN) CAPS Take 1 capsule by mouth 3 (three) times daily.     . hydrocortisone (CORTEF) 10 MG tablet Take 10 mg by mouth daily.     Marland Kitchen levothyroxine (SYNTHROID, LEVOTHROID) 50 MCG tablet Take 50 mcg by mouth daily  before breakfast.     . Lutein 6 MG CAPS Take 1 capsule by mouth daily.     . nitrofurantoin, macrocrystal-monohydrate, (MACROBID) 100 MG capsule Take 1 capsule by mouth daily.    . pantoprazole (PROTONIX) 40 MG tablet Take 40 mg by mouth daily.     . potassium chloride (MICRO-K) 10 MEQ CR capsule Take 10 mEq by mouth daily.     Marland Kitchen pyridostigmine (MESTINON) 60 MG tablet Take 60 mg by mouth 2 (two) times daily.     . ranitidine (ZANTAC) 300 MG tablet Take 1 tablet by mouth at bedtime.    . SAW PALMETTO-PUMPKIN SEED OIL PO Take 1 capsule by mouth daily.     . silodosin (RAPAFLO) 8 MG CAPS capsule Take 1 capsule (8 mg total) by mouth daily with breakfast. 30 capsule 3  . simvastatin (ZOCOR) 20 MG tablet Take 20 mg by mouth daily at 6 PM.     . vitamin B-12 (CYANOCOBALAMIN) 500 MCG tablet Take 500 mcg by mouth daily.     . vitamin E 400 UNIT capsule Take 400 Units by mouth daily.      No current facility-administered medications for this visit.      Marland Kitchen  PHYSICAL EXAMINATION: ECOG PERFORMANCE STATUS: 3 - Symptomatic, >50% confined to bed  There were no vitals filed for this visit. There were no vitals filed for this visit.  GENERAL: Well-nourished well-developed; Alert, no distress and comfortable.   Accompanied by his daughter-in-law. He is in a wheelchair.  EYES: no pallor or icterus OROPHARYNX: no thrush or ulceration; good dentition  NECK: supple, no masses felt LYMPH:  no palpable lymphadenopathy in the cervical, axillary or inguinal regions LUNGDecreased breath sounds on the right side. No wheeze or crackles HEART/CVS: regular rate & rhythm and no murmurs; No lower extremity edema ABDOMEN: abdomen soft, non-tender and normal bowel sounds Musculoskeletal:no cyanosis of digits and no clubbing  PSYCH: alert & oriented x 3 with fluent speech NEURO: no focal motor/sensory deficits SKIN:  no rashes or significant lesions  LABORATORY DATA:  I have reviewed the data as listed No  results found for: WBC, HGB, HCT, MCV, PLT No results for input(s): NA, K, CL, CO2, GLUCOSE, BUN, CREATININE, CALCIUM, GFRNONAA, GFRAA, PROT, ALBUMIN, AST, ALT, ALKPHOS, BILITOT, BILIDIR, IBILI in the last 8760 hours.  RADIOGRAPHIC STUDIES: I have personally reviewed the radiological images as listed and agreed with the findings in the report. Ct Chest Wo Contrast  01/19/2015  CLINICAL DATA:  81 year old male with shortness breath and severe weakness. History of lung cancer diagnosed in October 2016. EXAM: CT CHEST WITHOUT CONTRAST TECHNIQUE: Multidetector CT imaging of the chest was performed following the standard protocol without IV contrast. COMPARISON:  No priors. FINDINGS: Mediastinum/Lymph Nodes: Heart size is normal. There is no significant pericardial fluid, thickening or pericardial calcification. There is atherosclerosis of the thoracic aorta, the great vessels of the mediastinum and the  coronary arteries, including calcified atherosclerotic plaque in the left main, left anterior descending, left circumflex and right coronary arteries. Status post aortic valve replacement with what appears to be a stented bioprosthesis. Multiple borderline enlarged and mildly enlarged right hilar and mediastinal lymph nodes measuring up to 13 mm in short axis in the right paratracheal nodal station. Moderate-sized hiatal hernia. No axillary lymphadenopathy. Left-sided biventricular pacemaker/AICD with lead tips terminating in the right atrial appendage, right ventricular apex, and overlying the lateral wall the left ventricle via the coronary sinus and coronary veins. Lungs/Pleura: Complex right-sided pleural effusion which appears partially loculated. This pleural fluid is both near water density, and very low-attenuation (-57 HU), compatible with a chylous pleural effusion. No discrete pulmonary mass is confidently identified on today's noncontrast chest CT. There does appear to be potential pleural nodularity,  most evident in the inferior aspect of the right hemithorax where there are nodular and mass-like areas measuring up to 3.6 x 3.3 cm (image 38 of series 2). Extensive bronchiectasis is noted throughout the entire right lung, most severe in the right upper lobe where there are areas of moderate varicose bronchiectasis and there is extensive volume loss and what is likely some chronic areas of consolidation/scarring near the apex. Widespread septal thickening is also noted throughout the lungs bilaterally (right greater than left). In the left lung, findings are much less pronounced with predominantly areas of peripheral subpleural reticulation and septal thickening, with areas of architectural distortion and some peripheral areas of bronchiolectasis. No left pleural effusion. Upper Abdomen: 9 mm calcified gallstone in the gallbladder. Extensive atherosclerosis. Musculoskeletal/Soft Tissues: There are no aggressive appearing lytic or blastic lesions noted in the visualized portions of the skeleton. IMPRESSION: 1. 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 2. Areas of nodular and mass-like soft tissue in the inferior aspect of the right hemithorax may reflect malignant pleural involvement. No discrete pulmonary nodules or masses are identified on today's examination. Sampling of right-sided pleural fluid is recommended for further diagnostic purposes. 3. Extensive areas of fibrosis throughout the lungs bilaterally (right greater than left). The asymmetric appearance is highly unusual, but could be seen in the setting of prior right-sided radiation therapy. Clinical correlation is recommended. 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 Usual interstitial pneumonia (UIP) is less likely, but not excluded. Repeat high-resolution chest CT could be considered in 12 months  to assess for temporal changes in the appearance of the lung parenchyma if clinically appropriate. 4. Cholelithiasis. 5. Atherosclerosis, including left main and 3 vessel coronary artery disease. Assessment for potential risk factor modification, dietary therapy or pharmacologic therapy may be warranted, if clinically indicated. 6. Additional postoperative findings and support apparatus, as above. Electronically Signed   By: Vinnie Langton M.D.   On: 01/19/2015 13:55    ASSESSMENT & PLAN:   # LUNG CANCER- as per patient history/suspicious based on imaging. Patient is a poor candidate for any further therapy. He is agreeable to hold off any therapies.Will also await records.  # Pleural effusion right-sided- likely malignant; need to rule out other causes. Recommend thoracentesis. Spoke radiology- we'll also get labs to evaluate the etiology. Patient might need a Pleurx catheter if pleural effusion repeats.Check CBC CMP PT PTT.  # Discussed regarding hospice philosophy in detail- they have not made any decisions yet. Also discussed DNR/DNI- agreeable to DNR/DNI; patient/family Looking forward to quality of life.  All questions were answered. The patient knows to call the clinic with any problems, questions or concerns.I reviewed the images of the scans myself.  Patient will follow-up with me in approximately  10-14 days to review the above results/in the next plan of care.   # 60 minutes face-to-face with the patient/her daughter-in-law discussing the above plan of care; more than 50% of time spent on prognosis/ natural history; counseling and coordination.  Thank you Dr.Sparks for allowing me to participate in the care of your pleasant patient. Please do not hesitate to contact me with questions or concerns in the interim.       Cammie Sickle, MD 02/14/2015 12:59 PM

## 2015-02-14 NOTE — Progress Notes (Signed)
Patient is accompanied by daughter in law (POA), who is a key historian. Health Care POA did not bring documentation verification. Patient is alert and oriented x 4 and able to make his own health care decisions.  He wishes to be a DNR and a signed DNR paper for his home.  The patient does not have any outside records from [place] with him today. He has a "disc at home with some records or imaging."  Patient diagnosed with Lung cancer in May 2015.  Per POA, Larina Earthly (daughter in law of patient). Rutherford Guys stated, "He did radiation and chemotherapy treatments. He finished radiation therapy in August 2015. They had to quit the chemotherapy because he was too weak and too sick. He was in a nursing home for a while in rehab to build his strength.  He was treated in [place], [place] by Dr. Lenn Sink. In September 2015, his brother and I took turns going to [place] to take care of my father at his home there in [place]. We stopped all of his treatments so that he could have quality of life. His cancer was at a standstill. It never went away. He had a ultrasound guided pleural tapping over a year ago with 2 liters drawn.  I believe he needs another one today. I really wanted him to have one today while the patient is at the cancer center.  We moved my father in October 2016 so that he could be here with [place]. We wanted to take care of him in [place]." Patient states that he "has symptomatic shortness of breath and a productive cough" with "thick to moderate clear/white sputum". He is no longer on the [place]; He feels that a humidifier at home helps with the . He does not use oxygen at home. His baseline is at 97-98% when sitting down per patient. Patient states, "I feel better and my shortness of breath improves when I sit up at a 45 degree angle in a reclining position." Sats today are at 98% sitting down. Patient's last aspirin 81 mg taken this morning. He ate a light breakfast this morning before coming to the  clinic. Daughter states that she is supposed to "go out of town on Saturday. I would like the pleural tapping to be done asap if possible."  Misc. Patient History: Patient self caths daily. He was recently dx with a UTI and started on macrobid 100 mg capsule daily. He started back on lasix 20 mg every day since Friday of this week. He reports neuropathy in hands/ feet, which affects this ambulation. The patient states that he "can not feel the floor very well due to the neuropathy." He has a h/o diabetic neuropathy.  He no longer has a port a cath. The patient's port had to be removed by surgeon due to "displacement." patient denies any history of infections at the port a cath site.  He has a clinical history of a right lower extremity DVT.  In planning his u/s guided thoracentesis, Patient and his daughter in law were instructed to stop his aspirin and be NPO.

## 2015-02-15 LAB — CYTOLOGY - NON PAP

## 2015-02-19 ENCOUNTER — Telehealth: Payer: Self-pay | Admitting: *Deleted

## 2015-02-19 LAB — TRIGLYCERIDES, BODY FLUIDS: TRIGLYCERIDES FL: 1519 mg/dL

## 2015-02-19 LAB — CHOLESTEROL, BODY FLUID: CHOL FL: 70 mg/dL

## 2015-02-19 NOTE — Telephone Encounter (Signed)
Spoke with patient's daughter in law [place] on Thursday 02/15/15. Pt has a u/s guided thoracentesis on 02/14/15. Radiology removed 400 mls was removed at that time. Pt still c/o shortness of breath although mild improvement has been noted. An apt was given to see Dr. Rogue Bussing on 02/26/15 at 215pm. Prelim results provided to daughter but Rn explained that many cytology testing are still pending at this time.

## 2015-02-20 LAB — BODY FLUID CULTURE: CULTURE: NO GROWTH

## 2015-02-26 ENCOUNTER — Inpatient Hospital Stay (HOSPITAL_BASED_OUTPATIENT_CLINIC_OR_DEPARTMENT_OTHER): Payer: Medicare Other | Admitting: Internal Medicine

## 2015-02-26 VITALS — BP 96/62 | HR 70 | Temp 96.6°F | Ht 70.0 in | Wt 151.5 lb

## 2015-02-26 DIAGNOSIS — Z923 Personal history of irradiation: Secondary | ICD-10-CM | POA: Diagnosis not present

## 2015-02-26 DIAGNOSIS — Z79899 Other long term (current) drug therapy: Secondary | ICD-10-CM

## 2015-02-26 DIAGNOSIS — Z85118 Personal history of other malignant neoplasm of bronchus and lung: Secondary | ICD-10-CM

## 2015-02-26 DIAGNOSIS — Z87891 Personal history of nicotine dependence: Secondary | ICD-10-CM

## 2015-02-26 DIAGNOSIS — J940 Chylous effusion: Secondary | ICD-10-CM

## 2015-02-26 DIAGNOSIS — C3491 Malignant neoplasm of unspecified part of right bronchus or lung: Secondary | ICD-10-CM

## 2015-02-26 DIAGNOSIS — R0602 Shortness of breath: Secondary | ICD-10-CM | POA: Diagnosis not present

## 2015-02-26 DIAGNOSIS — Z9221 Personal history of antineoplastic chemotherapy: Secondary | ICD-10-CM

## 2015-02-26 LAB — PH, BODY FLUID: pH, Body Fluid: 7.8

## 2015-02-26 NOTE — Progress Notes (Signed)
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  male with prior history of lung cancer diagnosed in May 2015- also currently noted to have a right-sided loculated effusion is here status post thoracentesis. Patient is accompanied by his daughter-in-law to review the results of his thoracentesis.  Patient feels his shortness of breath is improved since having the thoracentesis done. He had only about 400 mL of fluid taken out. Patient continues to have chronic cough again getting worse. No hemoptysis. Poor appetite.   No headaches. Feeling weak all over. No nausea no vomiting. No pain.    ROS: A complete 10 point review of system is done which is negative except mentioned above in history of present illness  MEDICAL HISTORY:  Past Medical History  Diagnosis Date  . CHF (congestive heart failure) ([place])   . Lung cancer ([place])   . Valvular heart disease   . Cardiomyopathy ([place])   . Acquired hypothyroidism 11/02/2014  . Chronic diastolic heart failure ([place]) 11/02/2014  . Diabetes mellitus without complication ([place]) 08/02/4763  . Cardiac defibrillator in place 11/02/2014  . Gastro-esophageal reflux disease without esophagitis 11/02/2014  . History of atrial fibrillation 11/02/2014  . H/O malignant neoplasm 11/09/2014  . Neuropathy ([place]) 11/02/2014  . Orthostasis 11/02/2014  . Pure hypercholesterolemia 11/02/2014  . Heart valve disease 11/02/2014  . BPH (benign prostatic hyperplasia)     SURGICAL  HISTORY: Past Surgical History  Procedure Laterality Date  . Appendectomy    . Dual icd implant  2007/2009    implantable cardiac defibrillator  . Port-a-cath removal      SOCIAL HISTORY: Social History   Social History  . Marital Status: Widowed    Spouse Name: N/A  . Number of Children: N/A  . Years of Education: N/A   Occupational History  . Not on file.   Social History Main Topics  . Smoking status: Former Smoker    Types: Cigarettes    Quit date: 11/21/1981  . Smokeless tobacco: Never Used  . Alcohol Use: No  . Drug Use: No  . Sexual Activity: Not on file   Other Topics Concern  . Not on file   Social History Narrative    FAMILY HISTORY: Family History  Problem Relation Age of Onset  . Hypertension    . Heart disease      ALLERGIES:  is allergic to penicillin g and sulfa antibiotics.  MEDICATIONS:  Current Outpatient Prescriptions  Medication Sig Dispense Refill  . Ascorbic Acid (VITAMIN C) 1000 MG tablet Take by mouth.    Marland Kitchen aspirin EC 81 MG tablet Take by mouth.    . calcium-vitamin D (SM CALCIUM 500/VITAMIN D3) 500-400 MG-UNIT tablet Take by mouth.    . Coenzyme Q10 (TH CO Q-10) 100 MG capsule Take by mouth.    . digoxin (LANOXIN) 0.125 MG tablet Take 0.125 mg by mouth daily.     . furosemide (LASIX) 20 MG tablet Take 20 mg by mouth daily.     Marland Kitchen gabapentin (NEURONTIN) 300 MG capsule Take 300 mg by mouth  3 (three) times daily.     . Glucosamine-Chondroit-Vit C-Mn (GLUCOSAMINE-CHONDROITIN) CAPS Take 1 capsule by mouth 3 (three) times daily.     . hydrocortisone (CORTEF) 10 MG tablet Take 10 mg by mouth daily.     Marland Kitchen levothyroxine (SYNTHROID, LEVOTHROID) 50 MCG tablet Take 50 mcg by mouth daily before breakfast.     . Lutein 6 MG CAPS Take 1 capsule by mouth daily.     . pantoprazole (PROTONIX) 40 MG tablet Take 40 mg by mouth daily.     . potassium chloride (MICRO-K) 10 MEQ CR capsule Take 10 mEq by mouth daily.     Marland Kitchen pyridostigmine (MESTINON) 60 MG  tablet Take 60 mg by mouth 2 (two) times daily.     . ranitidine (ZANTAC) 300 MG tablet Take 1 tablet by mouth at bedtime.    . SAW PALMETTO-PUMPKIN SEED OIL PO Take 1 capsule by mouth daily.     . silodosin (RAPAFLO) 8 MG CAPS capsule Take 1 capsule (8 mg total) by mouth daily with breakfast. 30 capsule 3  . simvastatin (ZOCOR) 20 MG tablet Take 20 mg by mouth daily at 6 PM.     . vitamin B-12 (CYANOCOBALAMIN) 500 MCG tablet Take 500 mcg by mouth daily.     . vitamin E 400 UNIT capsule Take 400 Units by mouth daily.      No current facility-administered medications for this visit.      Marland Kitchen  PHYSICAL EXAMINATION: ECOG PERFORMANCE STATUS: 3 - Symptomatic, >50% confined to bed  Filed Vitals:   02/26/15 1451  BP: 96/62  Pulse: 70  Temp: 96.6 F (35.9 C)   Filed Weights   02/26/15 1451  Weight: 151 lb 8 oz (68.72 kg)    GENERAL: Well-nourished well-developed; Alert, no distress and comfortable.   Accompanied by his daughter-in-law. He is in a wheelchair.  EYES: no pallor or icterus OROPHARYNX: no thrush or ulceration; good dentition  NECK: supple, no masses felt LYMPH:  no palpable lymphadenopathy in the cervical, axillary or inguinal regions LUNG: Decreased breath sounds on the right side. No wheeze or crackles HEART/CVS: regular rate & rhythm and no murmurs; No lower extremity edema ABDOMEN: abdomen soft, non-tender and normal bowel sounds Musculoskeletal:no cyanosis of digits and no clubbing  PSYCH: alert & oriented x 3 with fluent speech NEURO: no focal motor/sensory deficits SKIN:  no rashes or significant lesions  LABORATORY DATA:  I have reviewed the data as listed Lab Results  Component Value Date   WBC 13.4* 02/14/2015   HGB 14.2 02/14/2015   HCT 44.1 02/14/2015   MCV 86.3 02/14/2015   PLT 257 02/14/2015    Recent Labs  02/14/15 1249  NA 135  K 3.8  CL 101  CO2 27  GLUCOSE 100*  BUN 19  CREATININE 0.86  CALCIUM 9.5  GFRNONAA >60  GFRAA >60  PROT  8.6*  ALBUMIN 4.0  AST 22  ALT 17  ALKPHOS 100  BILITOT 0.4    RADIOGRAPHIC STUDIES: I have personally reviewed the radiological images as listed and agreed with the findings in the report. Dg Chest 2 View  02/14/2015  CLINICAL DATA:  81 year old male with a history of right-sided pleural effusion diagnosed on CT 01/19/2015. Concern for chylous effusion. Patient has a history of right-sided lung carcinoma EXAM: CHEST - 2 VIEW COMPARISON:  CT 01/19/2015 FINDINGS: Cardiomediastinal silhouette projects within normal limits in size and contour. Dense opacity of the right chest, with left to right shift of  mediastinum. Minimal aeration of the mid and upper lung. Overall, opacity appears worse than the comparison. No evidence of right pneumothorax. Right-sided heart borders obscured. AICD on the left chest wall with 3 leads in place. Left lung relatively well aerated. No displaced fracture. IMPRESSION: Persisting opacity on the right, likely a combination of pleural fluid, atelectasis, and/ or consolidation. No hydro pneumothorax status post right thoracentesis. Unchanged position of left-sided cardiac AICD. Signed, Dulcy Fanny. Earleen Newport, DO [facility] Radiology Electronically Signed   By: Corrie Mckusick D.O.   On: 02/14/2015 14:59   US Thoracentesis Asp Pleural Space W/img Guide  02/14/2015  CLINICAL DATA:  81 year old male with a history of right-sided lung carcinoma and CT imaging diagnosis of right-sided pleural effusion, potentially chylous, referred for thoracentesis. EXAM: ULTRASOUND GUIDED RIGHT THORACENTESIS COMPARISON:  None. PROCEDURE: An ultrasound guided thoracentesis was thoroughly discussed with the patient and questions answered. The benefits, risks, alternatives and complications were also discussed. The patient understands and wishes to proceed with the procedure. Written consent was obtained. Ultrasound was performed to localize and mark an  adequate pocket of fluid in the right chest. The area was then prepped and draped in the normal sterile fashion. 1% Lidocaine was used for local anesthesia. Under ultrasound guidance Safe-T-Centesis catheter was introduced. Thoracentesis was performed. The catheter was removed when the patient experienced some discomfort and a dressing applied. Patient remained hemodynamically stable throughout. COMPLICATIONS: None FINDINGS: A total of approximately 400 cc of milky fluid was removed. A fluid sample wassent for laboratory and pathology analysis. IMPRESSION: Status post ultrasound-guided right-sided thoracentesis with 400 cc of milky fluid removed, concerning for chylous effusion. Sample was sent for pathology and laboratory analysis. Signed, Dulcy Fanny. Earleen Newport, DO [facility] Radiology Electronically Signed   By: Corrie Mckusick D.O.   On: 02/14/2015 14:55    ASSESSMENT & PLAN:   # LUNG CANCER- as per patient history/suspicious based on imaging. Patient is a poor candidate for any further therapy. He is agreeable to hold off any therapies. Still awaiting to get records.   # At this time I would recommend evaluation with a PET scan to review the status of his disease/if any extent of his disease. This is mainly for prognostication/ especially if patient wants to enroll in hospice. For now patient has not made decisions.  # With regards to chylous effusion as noted on the thoracentesis which is again partially loculated. I would recommend evaluation with thoracic surgery for recommendations. Cytology negative for malignancy.  # 25 minutes face-to-face with the patient/her daughter-in-law discussing the above plan of care; more than 50% of time spent on prognosis/ natural history; counseling and coordination.  # Patient follow-up with me in approximately few days after his PET scan.   Cammie Sickle, MD 02/27/2015 3:27 PM

## 2015-03-02 ENCOUNTER — Ambulatory Visit
Admission: RE | Admit: 2015-03-02 | Discharge: 2015-03-02 | Disposition: A | Discharge status: Home / Self Care | Payer: Medicare Other | Source: Ambulatory Visit | Attending: Internal Medicine | Admitting: Internal Medicine

## 2015-03-02 ENCOUNTER — Encounter: Payer: Self-pay | Admitting: Internal Medicine

## 2015-03-02 DIAGNOSIS — N3289 Other specified disorders of bladder: Secondary | ICD-10-CM | POA: Diagnosis not present

## 2015-03-02 DIAGNOSIS — C3491 Malignant neoplasm of unspecified part of right bronchus or lung: Secondary | ICD-10-CM

## 2015-03-02 DIAGNOSIS — K8020 Calculus of gallbladder without cholecystitis without obstruction: Secondary | ICD-10-CM | POA: Diagnosis not present

## 2015-03-02 DIAGNOSIS — J940 Chylous effusion: Secondary | ICD-10-CM | POA: Insufficient documentation

## 2015-03-02 DIAGNOSIS — J479 Bronchiectasis, uncomplicated: Secondary | ICD-10-CM | POA: Diagnosis not present

## 2015-03-02 DIAGNOSIS — Z0189 Encounter for other specified special examinations: Secondary | ICD-10-CM | POA: Insufficient documentation

## 2015-03-02 LAB — GLUCOSE, CAPILLARY: Glucose-Capillary: 79 mg/dL (ref 65–99)

## 2015-03-02 MED ORDER — FLUDEOXYGLUCOSE F - 18 (FDG) INJECTION
12.0400 | Freq: Once | INTRAVENOUS | Status: AC | PRN
  Administered 2015-03-02: 12.04 via INTRAVENOUS

## 2015-03-02 NOTE — Progress Notes (Signed)
#   June 2015- CT guided Bx- Right lung Bx- squamous cell carcinoma of the lung  # February 2016- prolonged hospitalization/orthostatic hypotension- ? Adrenal insuff vs? Amitriptyline/pneumomediastinum-conservative management  # history of MRSA wound [left nose abscess-April 2016 s/p drainage]  # NICMP/ EF ~30%; biVCD; s/p AV ablation

## 2015-03-08 ENCOUNTER — Inpatient Hospital Stay: Payer: Medicare Other | Admitting: Cardiothoracic Surgery

## 2015-03-08 ENCOUNTER — Inpatient Hospital Stay: Payer: Medicare Other | Admitting: Internal Medicine

## 2015-03-09 ENCOUNTER — Encounter: Payer: Self-pay | Admitting: Internal Medicine

## 2015-03-09 ENCOUNTER — Inpatient Hospital Stay: Payer: Medicare Other | Attending: Cardiothoracic Surgery | Admitting: Cardiothoracic Surgery

## 2015-03-09 ENCOUNTER — Inpatient Hospital Stay (HOSPITAL_BASED_OUTPATIENT_CLINIC_OR_DEPARTMENT_OTHER): Payer: Medicare Other | Admitting: Internal Medicine

## 2015-03-09 VITALS — BP 104/57 | HR 70 | Temp 98.9°F | Resp 18 | Ht 70.0 in | Wt 151.0 lb

## 2015-03-09 DIAGNOSIS — Z87891 Personal history of nicotine dependence: Secondary | ICD-10-CM | POA: Diagnosis not present

## 2015-03-09 DIAGNOSIS — Z95810 Presence of automatic (implantable) cardiac defibrillator: Secondary | ICD-10-CM | POA: Diagnosis not present

## 2015-03-09 DIAGNOSIS — C3491 Malignant neoplasm of unspecified part of right bronchus or lung: Secondary | ICD-10-CM

## 2015-03-09 DIAGNOSIS — J940 Chylous effusion: Secondary | ICD-10-CM | POA: Diagnosis present

## 2015-03-09 DIAGNOSIS — E7800 Pure hypercholesterolemia, unspecified: Secondary | ICD-10-CM | POA: Insufficient documentation

## 2015-03-09 DIAGNOSIS — Z9221 Personal history of antineoplastic chemotherapy: Secondary | ICD-10-CM | POA: Diagnosis not present

## 2015-03-09 DIAGNOSIS — Z923 Personal history of irradiation: Secondary | ICD-10-CM | POA: Insufficient documentation

## 2015-03-09 DIAGNOSIS — E119 Type 2 diabetes mellitus without complications: Secondary | ICD-10-CM | POA: Insufficient documentation

## 2015-03-09 DIAGNOSIS — Z85118 Personal history of other malignant neoplasm of bronchus and lung: Secondary | ICD-10-CM | POA: Insufficient documentation

## 2015-03-09 DIAGNOSIS — E039 Hypothyroidism, unspecified: Secondary | ICD-10-CM | POA: Insufficient documentation

## 2015-03-09 DIAGNOSIS — I5032 Chronic diastolic (congestive) heart failure: Secondary | ICD-10-CM | POA: Insufficient documentation

## 2015-03-09 DIAGNOSIS — I429 Cardiomyopathy, unspecified: Secondary | ICD-10-CM | POA: Diagnosis not present

## 2015-03-09 DIAGNOSIS — Z79899 Other long term (current) drug therapy: Secondary | ICD-10-CM | POA: Diagnosis not present

## 2015-03-09 DIAGNOSIS — I4891 Unspecified atrial fibrillation: Secondary | ICD-10-CM | POA: Insufficient documentation

## 2015-03-09 DIAGNOSIS — Z7982 Long term (current) use of aspirin: Secondary | ICD-10-CM | POA: Insufficient documentation

## 2015-03-09 DIAGNOSIS — N400 Enlarged prostate without lower urinary tract symptoms: Secondary | ICD-10-CM | POA: Insufficient documentation

## 2015-03-09 DIAGNOSIS — K219 Gastro-esophageal reflux disease without esophagitis: Secondary | ICD-10-CM | POA: Diagnosis not present

## 2015-03-09 DIAGNOSIS — J90 Pleural effusion, not elsewhere classified: Secondary | ICD-10-CM

## 2015-03-09 NOTE — Progress Notes (Signed)
Patient ID: Russell Howard, male   DOB: Sep 05, 1934, 81 y.o.   MRN: [id number]  Chief Complaint  Patient presents with  . Lung Cancer    surgical consult with Dr. Genevive Bi for recommendations for pleural effusion    Referred By Dr. Chevis Pretty Reason for Referral right pleural effusion  HPI Location, Quality, Duration, Severity, Timing, Context, Modifying Factors, Associated Signs and Symptoms.  Russell Howard is a 81 y.o. male.  His problems began several years ago when he was diagnosed with right-sided lung cancer in [place]. He was treated with a combination of radiation therapy and chemotherapy which completed about a year to a year and a half ago. He moved back to the [place] area to be closer to family. Over the last several months he's noticed an increasing shortness of breath and diminished exercise tolerance. He had a CT scan done and was found to have a right pleural effusion. A thoracentesis was performed which revealed a chylous effusion. He is referred for further discussion regarding that. Of note is that he's had an aortic valve replacement and a subsequent redo aortic valve replacement. It sounds as if this may have been a valve in valve type of procedure with than transaortic valve replacement.  In addition he has a history of pneumomediastinum about a year and a half ago while in [place]. In any event he's had increasing shortness of breath and after his thoracentesis his daughter believes that he did have some improvement. The patient states that his improvement was somewhat minimal. He is unable to do most of the activities of daily living. He's unable to take a shower because of shortness of breath. This did not change after his thoracentesis. He is also unable to get dressed by himself although he is able to move in a limited capacity on his own. He does not smoke. He quit smoking many years ago. He does have a history of an AICD. The battery has been changed once. I was asked to see him  today to comment regarding the potential for a Pleurx catheter or thoracoscopy.   Past Medical History  Diagnosis Date  . CHF (congestive heart failure) ([place])   . Lung cancer ([place])   . Valvular heart disease   . Cardiomyopathy ([place])   . Acquired hypothyroidism 11/02/2014  . Chronic diastolic heart failure ([place]) 11/02/2014  . Diabetes mellitus without complication ([place]) 7/82/9562  . Cardiac defibrillator in place 11/02/2014  . Gastro-esophageal reflux disease without esophagitis 11/02/2014  . History of atrial fibrillation 11/02/2014  . H/O malignant neoplasm 11/09/2014  . Neuropathy ([place]) 11/02/2014  . Orthostasis 11/02/2014  . Pure hypercholesterolemia 11/02/2014  . Heart valve disease 11/02/2014  . BPH (benign prostatic hyperplasia)     Past Surgical History  Procedure Laterality Date  . Appendectomy    . Dual icd implant  2007/2009    implantable cardiac defibrillator  . Port-a-cath removal      Family History  Problem Relation Age of Onset  . Hypertension    . Heart disease      Social History Social History  Substance Use Topics  . Smoking status: Former Smoker    Types: Cigarettes    Quit date: 11/21/1981  . Smokeless tobacco: Never Used  . Alcohol Use: No    Allergies  Allergen Reactions  . Penicillin G Hives  . Sulfa Antibiotics Rash    Current Outpatient Prescriptions  Medication Sig Dispense Refill  . Ascorbic Acid (VITAMIN C) 1000 MG tablet Take by mouth.    Marland Kitchen  aspirin EC 81 MG tablet Take by mouth.    . calcium-vitamin D (SM CALCIUM 500/VITAMIN D3) 500-400 MG-UNIT tablet Take by mouth.    . Coenzyme Q10 (TH CO Q-10) 100 MG capsule Take by mouth.    . digoxin (LANOXIN) 0.125 MG tablet Take 0.125 mg by mouth daily.     . furosemide (LASIX) 20 MG tablet Take 20 mg by mouth daily.     Marland Kitchen gabapentin (NEURONTIN) 300 MG capsule Take 300 mg by mouth 3 (three) times daily.     . Glucosamine-Chondroit-Vit C-Mn (GLUCOSAMINE-CHONDROITIN) CAPS Take 1 capsule by mouth  3 (three) times daily.     . hydrocortisone (CORTEF) 10 MG tablet Take 10 mg by mouth daily.     Marland Kitchen levothyroxine (SYNTHROID, LEVOTHROID) 50 MCG tablet Take 50 mcg by mouth daily before breakfast.     . Lutein 6 MG CAPS Take 1 capsule by mouth daily.     . pantoprazole (PROTONIX) 40 MG tablet Take 40 mg by mouth daily.     . potassium chloride (MICRO-K) 10 MEQ CR capsule Take 10 mEq by mouth daily.     Marland Kitchen pyridostigmine (MESTINON) 60 MG tablet Take 60 mg by mouth 2 (two) times daily.     . ranitidine (ZANTAC) 300 MG tablet Take 1 tablet by mouth at bedtime.    . SAW PALMETTO-PUMPKIN SEED OIL PO Take 1 capsule by mouth daily.     . silodosin (RAPAFLO) 8 MG CAPS capsule Take 1 capsule (8 mg total) by mouth daily with breakfast. 30 capsule 3  . simvastatin (ZOCOR) 20 MG tablet Take 20 mg by mouth daily at 6 PM.     . vitamin B-12 (CYANOCOBALAMIN) 500 MCG tablet Take 500 mcg by mouth daily.     . vitamin E 400 UNIT capsule Take 400 Units by mouth daily.      No current facility-administered medications for this visit.      Review of Systems A complete review of systems was asked and was negative except for the following positive findings loss of appetite, shortness of breath, occasional heartburn, occasional trouble swallowing, joint pain,  There were no vitals taken for this visit.  Physical Exam CONSTITUTIONAL:  Pleasant, well-developed, well-nourished, and in no acute distress. EYES: Pupils equal and reactive to light, Sclera non-icteric EARS, NOSE, MOUTH AND THROAT:  The oropharynx was clear.  Dentition is good repair.  Oral mucosa pink and moist. LYMPH NODES:  Lymph nodes in the neck and axillae were normal RESPIRATORY:  Lungs were clear on the left and markedly diminished with tubular breath sounds on the right.  Normal respiratory effort without pathologic use of accessory muscles of respiration CARDIOVASCULAR: Heart was regular with a systolic murmur.  There were no carotid  bruits. GI: The abdomen was soft, nontender, and nondistended. There were no palpable masses. There was no hepatosplenomegaly. There were normal bowel sounds in all quadrants. GU:  Rectal deferred.   MUSCULOSKELETAL:  Normal muscle strength and tone.  No clubbing or cyanosis.   SKIN:  There were no pathologic skin lesions.  There were no nodules on palpation. NEUROLOGIC:  Sensation is normal.  Cranial nerves are grossly intact. PSYCH:  Oriented to person, place and time.  Mood and affect are normal.  Data Reviewed  I have personally reviewed the patient's imaging, laboratory findings and medical records.    Assessment  I did review his films with the family today. I told him that the fluid has recurred based upon  the CT and PET scan images. I do not believe that he is a candidate for thoracoscopy and therefore I devoted my time to discussions regarding Pleurx catheter. He was adamant that he did not want that. Therefore I only suggested to him to have a thoracentesis performed on a symptomatic basis. He understood. He did not wish to pursue any surgical intervention.        Plan    he will follow-up with Dr. Ephraim Hamburger as needed.    Nestor Lewandowsky, MD 03/09/2015, 11:36 AM

## 2015-03-09 NOTE — Progress Notes (Signed)
Patient seen in clinic by Dr. Rogue Bussing and Dr. Genevive Bi. Nursing notes entered under Dr. Aletha Halim encounter visit.

## 2015-03-09 NOTE — Progress Notes (Signed)
[facility] CONSULT NOTE  Patient Care Team: Jeffrey D Sparks, MD as PCP - General (Internal Medicine)  CHIEF COMPLAINTS/PURPOSE OF CONSULTATION: recurrent Lung cancer  # MAY 2015 SQUAMOUS CELL CA ? STAGE III s/p Chemo-RT  [sep 2015]; [place], [place]; Dr.Gandhok]; Poor tol to Chemo; Jan 2017- PET- right sided consolidation/ effusion; no uptake s/o recurrence.   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  male with prior history of lung cancer diagnosed in May 2015- also currently noted to have a right-sided loculated chylous effusion is here to review the results of his PET scan.  Patient feels his shortness of breath is improved since having the thoracentesis done. He had only about 400 mL of fluid taken out-procedure was interrupted because of pain. He has intermittent cough which is not any worse. No hemoptysis. Poor appetite. No headaches. Feeling weak all over. No nausea no vomiting. No pain.  He continues to be in a wheelchair most of the time.  He continues to deny any pain.  ROS: A complete 10 point review of system is done which is negative except mentioned above in history of present illness  MEDICAL HISTORY:  Past Medical History  Diagnosis Date  . CHF (congestive heart failure) (HCC)   . Lung cancer (HCC)   . Valvular heart disease   . Cardiomyopathy (HCC)   . Acquired hypothyroidism 11/02/2014  . Chronic diastolic heart failure (HCC) 11/02/2014  . Diabetes mellitus without complication (HCC) 11/02/2014  . Cardiac defibrillator in place 11/02/2014  . Gastro-esophageal reflux disease without esophagitis 11/02/2014  . History of atrial fibrillation 11/02/2014  . H/O malignant neoplasm 11/09/2014  . Neuropathy (HCC) 11/02/2014  .  Orthostasis 11/02/2014  . Pure hypercholesterolemia 11/02/2014  . Heart valve disease 11/02/2014  . BPH (benign prostatic hyperplasia)     SURGICAL HISTORY: Past Surgical History  Procedure Laterality Date  . Appendectomy    . Dual icd implant  2007/2009    implantable cardiac defibrillator  . Port-a-cath removal      SOCIAL HISTORY: Social History   Social History  . Marital Status: Widowed    Spouse Name: N/A  . Number of Children: N/A  . Years of Education: N/A   Occupational History  . Not on file.   Social History Main Topics  . Smoking status: Former Smoker    Types: Cigarettes    Quit date: 11/21/1981  . Smokeless tobacco: Never Used  . Alcohol Use: No  . Drug Use: No  . Sexual Activity: Not on file   Other Topics Concern  . Not on file   Social History Narrative    FAMILY HISTORY: Family History  Problem Relation Age of Onset  . Hypertension    . Heart disease      ALLERGIES:  is allergic to penicillin g and sulfa antibiotics.  MEDICATIONS:  Current Outpatient Prescriptions  Medication Sig Dispense Refill  . Ascorbic Acid (VITAMIN C) 1000 MG tablet Take by mouth.    . aspirin EC 81 MG tablet Take by mouth.    . calcium-vitamin D (SM CALCIUM 500/VITAMIN D3) 500-400 MG-UNIT tablet Take by mouth.    . Coenzyme Q10 (TH CO Q-10) 100 MG capsule Take by mouth.    . digoxin (LANOXIN) 0.125 MG tablet Take 0.125 mg by mouth daily.     .   furosemide (LASIX) 20 MG tablet Take 20 mg by mouth daily.     Marland Kitchen gabapentin (NEURONTIN) 300 MG capsule Take 300 mg by mouth 3 (three) times daily.     . Glucosamine-Chondroit-Vit C-Mn (GLUCOSAMINE-CHONDROITIN) CAPS Take 1 capsule by mouth 3 (three) times daily.     . hydrocortisone (CORTEF) 10 MG tablet Take 10 mg by mouth daily.     Marland Kitchen levothyroxine (SYNTHROID, LEVOTHROID) 50 MCG tablet Take 50 mcg by mouth daily before breakfast.     . Lutein 6 MG CAPS Take 1 capsule by mouth daily.     . pantoprazole (PROTONIX) 40 MG  tablet Take 40 mg by mouth daily.     . potassium chloride (MICRO-K) 10 MEQ CR capsule Take 10 mEq by mouth daily.     Marland Kitchen pyridostigmine (MESTINON) 60 MG tablet Take 60 mg by mouth 2 (two) times daily.     . ranitidine (ZANTAC) 300 MG tablet Take 1 tablet by mouth at bedtime.    . SAW PALMETTO-PUMPKIN SEED OIL PO Take 1 capsule by mouth daily.     . silodosin (RAPAFLO) 8 MG CAPS capsule Take 1 capsule (8 mg total) by mouth daily with breakfast. 30 capsule 3  . simvastatin (ZOCOR) 20 MG tablet Take 20 mg by mouth daily at 6 PM.     . vitamin B-12 (CYANOCOBALAMIN) 500 MCG tablet Take 500 mcg by mouth daily.     . vitamin E 400 UNIT capsule Take 400 Units by mouth daily.      No current facility-administered medications for this visit.      Marland Kitchen  PHYSICAL EXAMINATION: ECOG PERFORMANCE STATUS: 3 - Symptomatic, >50% confined to bed  Filed Vitals:   03/09/15 1102  BP: 104/57  Pulse: 70  Temp: 98.9 F (37.2 C)  Resp: 18   Filed Weights   03/09/15 1102  Weight: 151 lb (68.493 kg)    GENERAL: Well-nourished well-developed; Alert, no distress and comfortable.   Accompanied by his daughter-in-law. He is in a wheelchair.    LABORATORY DATA:  I have reviewed the data as listed Lab Results  Component Value Date   WBC 13.4* 02/14/2015   HGB 14.2 02/14/2015   HCT 44.1 02/14/2015   MCV 86.3 02/14/2015   PLT 257 02/14/2015    Recent Labs  02/14/15 1249  NA 135  K 3.8  CL 101  CO2 27  GLUCOSE 100*  BUN 19  CREATININE 0.86  CALCIUM 9.5  GFRNONAA >60  GFRAA >60  PROT 8.6*  ALBUMIN 4.0  AST 22  ALT 17  ALKPHOS 100  BILITOT 0.4    RADIOGRAPHIC STUDIES: I have personally reviewed the radiological images as listed and agreed with the findings in the report. Dg Chest 2 View  02/14/2015  CLINICAL DATA:  81 year old male with a history of right-sided pleural effusion diagnosed on CT 01/19/2015. Concern for chylous effusion. Patient has a history of right-sided lung carcinoma  EXAM: CHEST - 2 VIEW COMPARISON:  CT 01/19/2015 FINDINGS: Cardiomediastinal silhouette projects within normal limits in size and contour. Dense opacity of the right chest, with left to right shift of mediastinum. Minimal aeration of the mid and upper lung. Overall, opacity appears worse than the comparison. No evidence of right pneumothorax. Right-sided heart borders obscured. AICD on the left chest wall with 3 leads in place. Left lung relatively well aerated. No displaced fracture. IMPRESSION: Persisting opacity on the right, likely a combination of pleural fluid, atelectasis, and/ or consolidation. No hydro pneumothorax  status post right thoracentesis. Unchanged position of left-sided cardiac AICD. Signed, Dulcy Fanny. Earleen Newport, DO [facility] Electronically Signed   By: Corrie Mckusick D.O.   On: 02/14/2015 14:59   Nm Pet Image Restag (ps) Skull Base To Thigh  03/02/2015  CLINICAL DATA:  Subsequent Treatment strategy for lung cancer. Last chemotherapy and radiation therapy administered in 2015. EXAM: NUCLEAR MEDICINE PET SKULL BASE TO THIGH TECHNIQUE: 12.04 mCi F-18 FDG was injected intravenously. Full-ring PET imaging was performed from the skull base to thigh after the radiotracer. CT data was obtained and used for attenuation correction and anatomic localization. FASTING BLOOD GLUCOSE:  Value: 79 mg/dl COMPARISON:  Chest CT 01/19/2015. FINDINGS: NECK No hypermetabolic cervical lymph nodes are identified.There are no lesions of the pharyngeal mucosal space. There is physiologic activity associated with the muscles of phonation and within the upper posterior neck musculature. CHEST There are no hypermetabolic mediastinal, hilar or axillary lymph nodes. Multiple prominent right hilar and paratracheal lymph nodes are stable, with metabolic activity similar to blood pool. The complex right pleural effusion appears unchanged without associated hypermetabolic  activity. Again, this has very low-density components consistent with a chylous effusion. There is no suspicious pulmonary activity. Extensive bronchiectasis throughout the right lung and extensive subpleural reticulation and septal thickening throughout both lungs appears unchanged. There is no evidence of focal lung mass. Diffuse atherosclerosis and a pacemaker are again noted. ABDOMEN/PELVIS There is no hypermetabolic activity within the liver, spleen, adrenal glands or pancreas. There is no hypermetabolic nodal activity. Incidental findings include the presence of a calcified gallstone, diffuse atherosclerosis and diffuse trabeculation of the urinary bladder. SKELETON There are no highly suspicious osseous findings. There is a small focus of mildly increased metabolic activity in the right aspect of the T2 vertebral body. This has no corresponding abnormality on the CT images and has an SUV max of 5.4. There is scattered low level degenerative activity. IMPRESSION: 1. No findings suspicious for local recurrence or definite metastatic disease. 2. Indeterminate activity in the right aspect of the T2 vertebral body without clear corresponding abnormality on the CT images. 3. Stable post therapy changes in the chest with chronic complex right chylous effusion, diffuse right lung bronchiectasis and extensive subpleural reticulation and septal thickening in both lungs. 4. Incidental findings including diffuse atherosclerosis, cholelithiasis and bladder trabeculation. Electronically Signed   By: Richardean Sale M.D.   On: 03/02/2015 12:05   US Thoracentesis Asp Pleural Space W/img Guide  02/14/2015  CLINICAL DATA:  81 year old male with a history of right-sided lung carcinoma and CT imaging diagnosis of right-sided pleural effusion, potentially chylous, referred for thoracentesis. EXAM: ULTRASOUND GUIDED RIGHT THORACENTESIS COMPARISON:  None. PROCEDURE: An ultrasound guided thoracentesis was thoroughly discussed  with the patient and questions answered. The benefits, risks, alternatives and complications were also discussed. The patient understands and wishes to proceed with the procedure. Written consent was obtained. Ultrasound was performed to localize and mark an adequate pocket of fluid in the right chest. The area was then prepped and draped in the normal sterile fashion. 1% Lidocaine was used for local anesthesia. Under ultrasound guidance Safe-T-Centesis catheter was introduced. Thoracentesis was performed. The catheter was removed when the patient experienced some discomfort and a dressing applied. Patient remained hemodynamically stable throughout. COMPLICATIONS: None FINDINGS: A total of approximately 400 cc of milky fluid was removed. A fluid sample wassent for laboratory and pathology analysis. IMPRESSION: Status post ultrasound-guided right-sided thoracentesis with 400 cc of milky fluid  removed, concerning for chylous effusion. Sample was sent for pathology and laboratory analysis. Signed, Jaime S. Wagner, DO [facility] Electronically Signed   By: Jaime  Wagner D.O.   On: 02/14/2015 14:55    ASSESSMENT & PLAN:   # SQUAMOUS CELL LUNG CANCER- at least stage III/locally advanced status post chemoradiation [finished June 2015 [place]]. I reviewed the PET scan today- no obvious evidence of recurrence noted on the PET scan. I reviewed the images myself. Discussed the possibility of further evaluation- with a biopsy of the mass in the right lower lobe/pleural. This would need invasive procedure; patient declines. I think is reasonable.  # chylous effusion of the right lung/complex- the etiology is unclear. But I suspect a combination of previous pneumomediastinum [2016 unclear etiology]; prior open-heart surgery; also previous radiation. Patient met with thoracic surgery this morning. No good options available. For now I recommend checking a chest  x-ray in 4 weeks/or sooner if needed patient gets symptomatic with shortness of breath. Patient will need thoracentesis again based on symptoms/chest x-ray. I also spoke to Dr. Oakes.  # patient will follow-up with me in 3 months with labs.   # 15  minutes face-to-face with the patient/her daughter-in-law discussing the above plan of care; more than 50% of time spent on prognosis/ natural history; counseling and coordination.   Govinda R Brahmanday, MD 03/09/2015 11:21 AM   

## 2015-03-30 ENCOUNTER — Encounter: Payer: Self-pay | Admitting: Emergency Medicine

## 2015-03-30 ENCOUNTER — Emergency Department
Admission: EM | Admit: 2015-03-30 | Discharge: 2015-03-30 | Disposition: A | Discharge status: Home / Self Care | Payer: Medicare Other | Attending: Emergency Medicine | Admitting: Emergency Medicine

## 2015-03-30 DIAGNOSIS — R309 Painful micturition, unspecified: Secondary | ICD-10-CM | POA: Diagnosis present

## 2015-03-30 DIAGNOSIS — E119 Type 2 diabetes mellitus without complications: Secondary | ICD-10-CM | POA: Insufficient documentation

## 2015-03-30 DIAGNOSIS — N390 Urinary tract infection, site not specified: Secondary | ICD-10-CM

## 2015-03-30 DIAGNOSIS — Z7982 Long term (current) use of aspirin: Secondary | ICD-10-CM | POA: Insufficient documentation

## 2015-03-30 DIAGNOSIS — Z87891 Personal history of nicotine dependence: Secondary | ICD-10-CM | POA: Diagnosis not present

## 2015-03-30 DIAGNOSIS — Z79899 Other long term (current) drug therapy: Secondary | ICD-10-CM | POA: Diagnosis not present

## 2015-03-30 DIAGNOSIS — Z880 Allergy status to penicillin: Secondary | ICD-10-CM | POA: Insufficient documentation

## 2015-03-30 LAB — URINALYSIS COMPLETE WITH MICROSCOPIC (ARMC ONLY)
BILIRUBIN URINE: NEGATIVE
Bacteria, UA: NONE SEEN
GLUCOSE, UA: NEGATIVE mg/dL
KETONES UR: NEGATIVE mg/dL
NITRITE: NEGATIVE
PROTEIN: 100 mg/dL — AB
SPECIFIC GRAVITY, URINE: 1.011 (ref 1.005–1.030)
Squamous Epithelial / LPF: NONE SEEN
pH: 6 (ref 5.0–8.0)

## 2015-03-30 MED ORDER — LEVOFLOXACIN 500 MG PO TABS: 500.0000 mg | ORAL_TABLET | Freq: Every day | ORAL | Status: AC

## 2015-03-30 MED ORDER — LEVOFLOXACIN 500 MG PO TABS
500.0000 mg | ORAL_TABLET | Freq: Once | ORAL | Status: AC
  Administered 2015-03-30: 500 mg via ORAL
  Filled 2015-03-30: qty 1

## 2015-03-30 NOTE — ED Provider Notes (Signed)
CSN: [id number]     Arrival date & time 03/30/15  2139 History   First MD Initiated Contact with Patient 03/30/15 2251     Chief Complaint  Patient presents with  . Urinary Tract Infection     (Consider location/radiation/quality/duration/timing/severity/associated sxs/prior Treatment) HPI  81 -year-old male presents to the emergency department for evaluation of urinary tract infection. Patient has lateral reflux and prostate issues, has to self catheter himself twice daily. Over the last 2 days patient has had increased burning with urination and increase in frequency. He denies any pain, pressure, lower back pain, fevers. Patient has had frequent urinary tract infections, last infection was 7 weeks ago and responded well to Levaquin.  Past Medical History  Diagnosis Date  . CHF (congestive heart failure) ([place])   . Lung cancer ([place])   . Valvular heart disease   . Cardiomyopathy ([place])   . Acquired hypothyroidism 11/02/2014  . Chronic diastolic heart failure ([place]) 11/02/2014  . Diabetes mellitus without complication ([place]) 09/27/537  . Cardiac defibrillator in place 11/02/2014  . Gastro-esophageal reflux disease without esophagitis 11/02/2014  . History of atrial fibrillation 11/02/2014  . H/O malignant neoplasm 11/09/2014  . Neuropathy ([place]) 11/02/2014  . Orthostasis 11/02/2014  . Pure hypercholesterolemia 11/02/2014  . Heart valve disease 11/02/2014  . BPH (benign prostatic hyperplasia)    Past Surgical History  Procedure Laterality Date  . Appendectomy    . Dual icd implant  2007/2009    implantable cardiac defibrillator  . Port-a-cath removal     Family History  Problem Relation Age of Onset  . Hypertension    . Heart disease     Social History  Substance Use Topics  . Smoking status: Former Smoker    Types: Cigarettes    Quit date: 11/21/1981  . Smokeless tobacco: Never Used  . Alcohol Use: No    Review of Systems  Constitutional: Negative.  Negative for fever, chills,  activity change and appetite change.  HENT: Negative for congestion, ear pain, mouth sores, rhinorrhea, sinus pressure, sore throat and trouble swallowing.   Eyes: Negative for photophobia, pain and discharge.  Respiratory: Negative for cough, chest tightness and shortness of breath.   Cardiovascular: Negative for chest pain and leg swelling.  Gastrointestinal: Negative for nausea, vomiting, abdominal pain, diarrhea and abdominal distention.  Genitourinary: Positive for dysuria, frequency and difficulty urinating. Negative for penile swelling, penile pain and testicular pain.  Musculoskeletal: Negative for back pain, arthralgias and gait problem.  Skin: Negative for color change and rash.  Neurological: Negative for dizziness and headaches.  Hematological: Negative for adenopathy.  Psychiatric/Behavioral: Negative for behavioral problems and agitation.      Allergies  Penicillin g and Sulfa antibiotics  Home Medications   Prior to Admission medications   Medication Sig Start Date End Date Taking? Authorizing Provider  Ascorbic Acid (VITAMIN C) 1000 MG tablet Take by mouth.    Historical Provider, MD  aspirin EC 81 MG tablet Take by mouth.    Historical Provider, MD  calcium-vitamin D (SM CALCIUM 500/VITAMIN D3) 500-400 MG-UNIT tablet Take by mouth.    Historical Provider, MD  Coenzyme Q10 (TH CO Q-10) 100 MG capsule Take by mouth.    Historical Provider, MD  digoxin (LANOXIN) 0.125 MG tablet Take 0.125 mg by mouth daily.     Historical Provider, MD  furosemide (LASIX) 20 MG tablet Take 20 mg by mouth daily.     Historical Provider, MD  gabapentin (NEURONTIN) 300 MG capsule Take 300  mg by mouth 3 (three) times daily.  12/20/14   Historical Provider, MD  Glucosamine-Chondroit-Vit C-Mn (GLUCOSAMINE-CHONDROITIN) CAPS Take 1 capsule by mouth 3 (three) times daily.     Historical Provider, MD  hydrocortisone (CORTEF) 10 MG tablet Take 10 mg by mouth daily.  11/02/14   Historical Provider, MD   levofloxacin (LEVAQUIN) 500 MG tablet Take 1 tablet (500 mg total) by mouth daily. X 7 days 03/30/15 04/09/15  Duanne Guess, PA-C  levothyroxine (SYNTHROID, LEVOTHROID) 50 MCG tablet Take 50 mcg by mouth daily before breakfast.  11/03/14 05/02/15  Historical Provider, MD  Lutein 6 MG CAPS Take 1 capsule by mouth daily.     Historical Provider, MD  pantoprazole (PROTONIX) 40 MG tablet Take 40 mg by mouth daily.     Historical Provider, MD  potassium chloride (MICRO-K) 10 MEQ CR capsule Take 10 mEq by mouth daily.     Historical Provider, MD  pyridostigmine (MESTINON) 60 MG tablet Take 60 mg by mouth 2 (two) times daily.     Historical Provider, MD  ranitidine (ZANTAC) 300 MG tablet Take 1 tablet by mouth at bedtime. 02/08/15   Historical Provider, MD  SAW PALMETTO-PUMPKIN SEED OIL PO Take 1 capsule by mouth daily.     Historical Provider, MD  silodosin (RAPAFLO) 8 MG CAPS capsule Take 1 capsule (8 mg total) by mouth daily with breakfast. 11/22/14   Roda Shutters, FNP  simvastatin (ZOCOR) 20 MG tablet Take 20 mg by mouth daily at 6 PM.     Historical Provider, MD  vitamin B-12 (CYANOCOBALAMIN) 500 MCG tablet Take 500 mcg by mouth daily.     Historical Provider, MD  vitamin E 400 UNIT capsule Take 400 Units by mouth daily.     Historical Provider, MD   BP 121/62 mmHg  Pulse 71  Temp(Src) 97.9 F (36.6 C) (Oral)  Resp 18  Ht '5\' 10"'$  (1.778 m)  Wt 68.947 kg  BMI 21.81 kg/m2  SpO2 93% Physical Exam  Constitutional: He is oriented to person, place, and time. He appears well-developed and well-nourished.  HENT:  Head: Normocephalic and atraumatic.  Eyes: Conjunctivae and EOM are normal. Pupils are equal, round, and reactive to light.  Neck: Normal range of motion. Neck supple.  Cardiovascular: Normal rate, regular rhythm and intact distal pulses.   Pulmonary/Chest: Effort normal and breath sounds normal. No respiratory distress. He exhibits no tenderness.  Abdominal: Soft. Bowel sounds are  normal. He exhibits no distension and no mass. There is no tenderness. There is no rebound and no guarding.  Musculoskeletal: Normal range of motion. He exhibits no edema or tenderness.  Neurological: He is alert and oriented to person, place, and time.  Skin: Skin is warm and dry.  Psychiatric: He has a normal mood and affect. His behavior is normal. Judgment and thought content normal.    ED Course  Procedures (including critical care time) Labs Review Labs Reviewed  URINALYSIS COMPLETEWITH MICROSCOPIC ([place]) - Abnormal; Notable for the following:    Color, Urine YELLOW (*)    APPearance TURBID (*)    Hgb urine dipstick 2+ (*)    Protein, ur 100 (*)    Leukocytes, UA 3+ (*)    All other components within normal limits  URINE CULTURE    Imaging Review No results found. I have personally reviewed and evaluated these images and lab results as part of my medical decision-making.   EKG Interpretation None      MDM  Final diagnoses:  UTI (lower urinary tract infection)    81 year old male with bladder reflux, presents with dysuria. Has had frequent urinary tract infections. Urinalysis today shows significant infection. Patient started on Levaquin 500 milligrams daily for 7 days. Cultures were ordered. Patient is able to actively make urine on his own. At baseline he self caths twice daily. She'll follow-up with urologist in 2-3 days if no improvement. Return to the ER for any worsening symptoms urgent changes in health.    Duanne Guess, PA-C 03/30/15 2304  Carrie Mew, MD 03/30/15 [phone]

## 2015-03-30 NOTE — ED Notes (Signed)
Patient with complaint of pain with urination and urinary frequency that started this morning. Patient reports he has frequent urinary tract infections.

## 2015-03-30 NOTE — Discharge Instructions (Signed)

## 2015-04-01 LAB — URINE CULTURE

## 2015-04-02 ENCOUNTER — Other Ambulatory Visit: Payer: Self-pay | Admitting: Obstetrics and Gynecology

## 2015-04-04 ENCOUNTER — Ambulatory Visit
Admission: RE | Admit: 2015-04-04 | Discharge: 2015-04-04 | Disposition: A | Discharge status: Home / Self Care | Payer: Medicare Other | Source: Ambulatory Visit | Attending: Internal Medicine | Admitting: Internal Medicine

## 2015-04-04 ENCOUNTER — Ambulatory Visit
Admission: RE | Admit: 2015-04-04 | Discharge: 2015-04-04 | Disposition: A | Discharge status: Home / Self Care | Payer: Medicare Other | Source: Ambulatory Visit | Attending: Cardiothoracic Surgery | Admitting: Cardiothoracic Surgery

## 2015-04-04 DIAGNOSIS — Z950 Presence of cardiac pacemaker: Secondary | ICD-10-CM | POA: Diagnosis not present

## 2015-04-04 DIAGNOSIS — J90 Pleural effusion, not elsewhere classified: Secondary | ICD-10-CM | POA: Diagnosis not present

## 2015-04-04 DIAGNOSIS — J479 Bronchiectasis, uncomplicated: Secondary | ICD-10-CM | POA: Diagnosis not present

## 2015-04-04 DIAGNOSIS — Z85118 Personal history of other malignant neoplasm of bronchus and lung: Secondary | ICD-10-CM

## 2015-04-05 ENCOUNTER — Inpatient Hospital Stay: Payer: Medicare Other | Attending: Cardiothoracic Surgery | Admitting: Cardiothoracic Surgery

## 2015-04-05 ENCOUNTER — Inpatient Hospital Stay: Payer: Medicare Other | Admitting: Cardiothoracic Surgery

## 2015-04-05 VITALS — BP 105/66 | HR 69 | Temp 96.0°F | Resp 16

## 2015-04-05 DIAGNOSIS — C3491 Malignant neoplasm of unspecified part of right bronchus or lung: Secondary | ICD-10-CM

## 2015-04-05 DIAGNOSIS — J90 Pleural effusion, not elsewhere classified: Secondary | ICD-10-CM | POA: Insufficient documentation

## 2015-04-05 NOTE — Progress Notes (Signed)
Patient comes alone today due to children being restricted in facility at this time.  He is currently being treated for UTI and unsure by whom.  Had an increase in SOBr this morning so he took 1 Furosemide '20mg'$  and he feels like that was much help.

## 2015-04-05 NOTE — Progress Notes (Signed)
[facility] Inpatient Post-Op Note  Patient ID: Russell Howard, male   DOB: Dec 16, 1934, 81 y.o.   MRN: [id number]  HISTORY: This patient is a 81 year old gentleman who presents today in follow-up. He was seen about a month ago where he had a chest x-ray and CT scan suggesting a possible right-sided pleural effusion. He did have a chest x-ray made yesterday and he comes in for those results. He states he has continued shortness of breath with minimal activity. He spends almost all of his day sitting in a chair.   Filed Vitals:   04/05/15 1354  BP: 105/66  Pulse: 69  Temp: 96 F (35.6 C)  Resp: 16     EXAM: Resp: Lungs are clear On the left and markedly diminished on the right.  No respiratory distress, normal effort. Heart:  Regular without murmurs Neurological: Alert and oriented to person, place, and time. Coordination normal.  Skin: Skin is warm and dry. No rash noted. No diaphoretic. No erythema. No pallor.  Psychiatric: Normal mood and affect. Normal behavior. Judgment and thought content normal.    ASSESSMENT: I have independently reviewed his chest x-ray. I do not see any significant change in the last several weeks. There is no real significant pleural effusion present.   PLAN:   At the present time I did not have anything further to offer this patient. I did not make a return visit form. He will follow-up with Dr. Ephraim Hamburger as per his routine.    Nestor Lewandowsky, MD

## 2015-04-06 ENCOUNTER — Telehealth: Payer: Self-pay | Admitting: *Deleted

## 2015-04-06 NOTE — Telephone Encounter (Signed)
Called and spoke to pt and DIL to tell them that Dr. B said that cxr is stable.  He would like to repeat the xray in 6 weeks.  He also has appt to see dr B o n 4/7 so I spoke to md and he states that the pt can come a little earlier than his appt time and go to hosp. And have chest xray.  I have printed pt schedule of appt and wrote note to remind him to have chest xray since he does not need appt to have it done.  Family and pt agreeable and I put the appt with special note in mail

## 2015-05-07 ENCOUNTER — Other Ambulatory Visit: Payer: Self-pay | Admitting: Urology

## 2015-05-11 ENCOUNTER — Ambulatory Visit
Admission: RE | Admit: 2015-05-11 | Discharge: 2015-05-11 | Disposition: A | Discharge status: Home / Self Care | Payer: Medicare Other | Source: Ambulatory Visit | Attending: Internal Medicine | Admitting: Internal Medicine

## 2015-05-11 ENCOUNTER — Inpatient Hospital Stay (HOSPITAL_BASED_OUTPATIENT_CLINIC_OR_DEPARTMENT_OTHER): Payer: Medicare Other | Admitting: Internal Medicine

## 2015-05-11 ENCOUNTER — Inpatient Hospital Stay: Payer: Medicare Other | Admitting: Internal Medicine

## 2015-05-11 ENCOUNTER — Inpatient Hospital Stay: Payer: Medicare Other | Attending: Internal Medicine

## 2015-05-11 ENCOUNTER — Other Ambulatory Visit: Payer: Self-pay | Admitting: *Deleted

## 2015-05-11 ENCOUNTER — Other Ambulatory Visit: Payer: Medicare Other

## 2015-05-11 VITALS — BP 107/69 | HR 67 | Temp 97.2°F | Wt 154.0 lb

## 2015-05-11 DIAGNOSIS — E7800 Pure hypercholesterolemia, unspecified: Secondary | ICD-10-CM | POA: Diagnosis not present

## 2015-05-11 DIAGNOSIS — G629 Polyneuropathy, unspecified: Secondary | ICD-10-CM

## 2015-05-11 DIAGNOSIS — J90 Pleural effusion, not elsewhere classified: Secondary | ICD-10-CM

## 2015-05-11 DIAGNOSIS — K219 Gastro-esophageal reflux disease without esophagitis: Secondary | ICD-10-CM

## 2015-05-11 DIAGNOSIS — Z7982 Long term (current) use of aspirin: Secondary | ICD-10-CM | POA: Insufficient documentation

## 2015-05-11 DIAGNOSIS — I38 Endocarditis, valve unspecified: Secondary | ICD-10-CM | POA: Insufficient documentation

## 2015-05-11 DIAGNOSIS — E119 Type 2 diabetes mellitus without complications: Secondary | ICD-10-CM

## 2015-05-11 DIAGNOSIS — E039 Hypothyroidism, unspecified: Secondary | ICD-10-CM | POA: Diagnosis not present

## 2015-05-11 DIAGNOSIS — C3491 Malignant neoplasm of unspecified part of right bronchus or lung: Secondary | ICD-10-CM | POA: Diagnosis present

## 2015-05-11 DIAGNOSIS — Z79899 Other long term (current) drug therapy: Secondary | ICD-10-CM | POA: Diagnosis not present

## 2015-05-11 DIAGNOSIS — I4891 Unspecified atrial fibrillation: Secondary | ICD-10-CM

## 2015-05-11 DIAGNOSIS — R0602 Shortness of breath: Secondary | ICD-10-CM | POA: Diagnosis not present

## 2015-05-11 DIAGNOSIS — I429 Cardiomyopathy, unspecified: Secondary | ICD-10-CM | POA: Insufficient documentation

## 2015-05-11 DIAGNOSIS — R918 Other nonspecific abnormal finding of lung field: Secondary | ICD-10-CM | POA: Insufficient documentation

## 2015-05-11 DIAGNOSIS — L03113 Cellulitis of right upper limb: Secondary | ICD-10-CM

## 2015-05-11 DIAGNOSIS — Z87891 Personal history of nicotine dependence: Secondary | ICD-10-CM | POA: Diagnosis not present

## 2015-05-11 DIAGNOSIS — J940 Chylous effusion: Secondary | ICD-10-CM | POA: Diagnosis not present

## 2015-05-11 DIAGNOSIS — Z95810 Presence of automatic (implantable) cardiac defibrillator: Secondary | ICD-10-CM | POA: Diagnosis not present

## 2015-05-11 DIAGNOSIS — N400 Enlarged prostate without lower urinary tract symptoms: Secondary | ICD-10-CM

## 2015-05-11 DIAGNOSIS — I509 Heart failure, unspecified: Secondary | ICD-10-CM

## 2015-05-11 DIAGNOSIS — Z923 Personal history of irradiation: Secondary | ICD-10-CM | POA: Insufficient documentation

## 2015-05-11 DIAGNOSIS — I5032 Chronic diastolic (congestive) heart failure: Secondary | ICD-10-CM

## 2015-05-11 DIAGNOSIS — Z9221 Personal history of antineoplastic chemotherapy: Secondary | ICD-10-CM | POA: Insufficient documentation

## 2015-05-11 DIAGNOSIS — Z85118 Personal history of other malignant neoplasm of bronchus and lung: Secondary | ICD-10-CM

## 2015-05-11 LAB — CBC WITH DIFFERENTIAL/PLATELET
BASOS PCT: 0 %
Basophils Absolute: 0 10*3/uL (ref 0–0.1)
Eosinophils Absolute: 0.2 10*3/uL (ref 0–0.7)
Eosinophils Relative: 2 %
HCT: 44.3 % (ref 40.0–52.0)
Hemoglobin: 14.8 g/dL (ref 13.0–18.0)
Lymphocytes Relative: 5 %
Lymphs Abs: 0.6 10*3/uL — ABNORMAL LOW (ref 1.0–3.6)
MCH: 29.2 pg (ref 26.0–34.0)
MCHC: 33.5 g/dL (ref 32.0–36.0)
MCV: 87.4 fL (ref 80.0–100.0)
MONO ABS: 0.9 10*3/uL (ref 0.2–1.0)
MONOS PCT: 8 %
Neutro Abs: 10.6 10*3/uL — ABNORMAL HIGH (ref 1.4–6.5)
Neutrophils Relative %: 85 %
Platelets: 217 10*3/uL (ref 150–440)
RBC: 5.07 MIL/uL (ref 4.40–5.90)
RDW: 17.6 % — AB (ref 11.5–14.5)
WBC: 12.3 10*3/uL — ABNORMAL HIGH (ref 3.8–10.6)

## 2015-05-11 LAB — COMPREHENSIVE METABOLIC PANEL
ALBUMIN: 3.8 g/dL (ref 3.5–5.0)
ALT: 14 U/L — ABNORMAL LOW (ref 17–63)
ANION GAP: 6 (ref 5–15)
AST: 20 U/L (ref 15–41)
Alkaline Phosphatase: 92 U/L (ref 38–126)
BILIRUBIN TOTAL: 0.6 mg/dL (ref 0.3–1.2)
BUN: 23 mg/dL — ABNORMAL HIGH (ref 6–20)
CO2: 27 mmol/L (ref 22–32)
Calcium: 9.4 mg/dL (ref 8.9–10.3)
Chloride: 104 mmol/L (ref 101–111)
Creatinine, Ser: 1.01 mg/dL (ref 0.61–1.24)
GFR calc Af Amer: >60 mL/min (ref >60)
GFR calc non Af Amer: >60 mL/min (ref >60)
Glucose, Bld: 87 mg/dL (ref 65–99)
POTASSIUM: 4.2 mmol/L (ref 3.5–5.1)
SODIUM: 137 mmol/L (ref 135–145)
TOTAL PROTEIN: 8.6 g/dL — AB (ref 6.5–8.1)

## 2015-05-11 MED ORDER — CEPHALEXIN 500 MG PO CAPS: 500.0000 mg | ORAL_CAPSULE | Freq: Three times a day (TID) | ORAL | Status: DC

## 2015-05-11 NOTE — Progress Notes (Signed)
RN rcvd call from Admitting/Reg. Desk. Pt at medical mall. Needing orders for CXR. Orders placed. Md will sign off.

## 2015-05-11 NOTE — Progress Notes (Signed)
[facility] CONSULT NOTE  Patient Care Team: [name], MD as PCP - General (Internal Medicine)  CHIEF COMPLAINTS/PURPOSE OF CONSULTATION: recurrent Lung cancer  # MAY 2015 SQUAMOUS CELL CA ? STAGE III s/p Chemo-RT  [sep 2015]; [place], [place]; [place]; Poor tol to Chemo; Jan 2017- PET- right sided consolidation/ effusion; no uptake s/o recurrence.   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  male with prior history of lung cancer diagnosed in May 2015- also currently noted to have a right-sided loculated chylous effusion is here for a follow-up/to review the results of the chest x-ray.  Patient denies any worsening shortness of breath. He has not needed any thoracentesis. Denies any worsening cough or hemoptysis. He continues to be in wheelchair because of general debility/ multiple medical problems.  He continues to deny any pain. Appetite is good. Not losing any weight. No swelling in the legs. Notes to have a dog scratch his right fore-arm appx 10 days ago.   ROS: A complete 10 point review of system is done which is negative except mentioned above in history of present illness  MEDICAL HISTORY:  Past Medical History  Diagnosis Date  . CHF (congestive heart failure) ([place])   . Lung cancer ([place])   . Valvular heart disease   . Cardiomyopathy ([place])   . Acquired hypothyroidism 11/02/2014  . Chronic diastolic heart failure ([place]) 11/02/2014  . Diabetes mellitus without complication ([place]) 05/12/8117  . Cardiac defibrillator in place 11/02/2014  . Gastro-esophageal reflux disease without esophagitis 11/02/2014  . History of atrial fibrillation 11/02/2014  . H/O malignant neoplasm 11/09/2014  . Neuropathy ([place]) 11/02/2014  . Orthostasis  11/02/2014  . Pure hypercholesterolemia 11/02/2014  . Heart valve disease 11/02/2014  . BPH (benign prostatic hyperplasia)     SURGICAL HISTORY: Past Surgical History  Procedure Laterality Date  . Appendectomy    . Dual icd implant  2007/2009    implantable cardiac defibrillator  . Port-a-cath removal      SOCIAL HISTORY: Social History   Social History  . Marital Status: Widowed    Spouse Name: N/A  . Number of Children: N/A  . Years of Education: N/A   Occupational History  . Not on file.   Social History Main Topics  . Smoking status: Former Smoker    Types: Cigarettes    Quit date: 11/21/1981  . Smokeless tobacco: Never Used  . Alcohol Use: No  . Drug Use: No  . Sexual Activity: Not on file   Other Topics Concern  . Not on file   Social History Narrative    FAMILY HISTORY: Family History  Problem Relation Age of Onset  . Hypertension    . Heart disease      ALLERGIES:  is allergic to penicillin g and sulfa antibiotics.  MEDICATIONS:  Current Outpatient Prescriptions  Medication Sig Dispense Refill  . Ascorbic Acid (VITAMIN C) 1000 MG tablet Take by mouth.    Marland Kitchen aspirin EC 81 MG tablet Take by mouth.    . calcium-vitamin D (SM CALCIUM 500/VITAMIN D3) 500-400 MG-UNIT tablet Take by mouth.    . Coenzyme Q10 (TH CO Q-10) 100 MG capsule Take by mouth.    . digoxin (LANOXIN) 0.125 MG tablet Take by mouth.    . furosemide (LASIX) 20 MG  tablet Take by mouth.    . gabapentin (NEURONTIN) 300 MG capsule Take 300 mg by mouth 3 (three) times daily.     . Glucosamine-Chondroit-Vit C-Mn (GLUCOSAMINE-CHONDROITIN) CAPS Take 1 capsule by mouth 3 (three) times daily.     . hydrocortisone (CORTEF) 10 MG tablet Take by mouth 2 (two) times daily.     Marland Kitchen levothyroxine (SYNTHROID, LEVOTHROID) 50 MCG tablet Take by mouth.    . Lutein 6 MG CAPS Take 1 capsule by mouth daily.     Marland Kitchen pyridostigmine (MESTINON) 60 MG tablet     . ranitidine (ZANTAC) 300 MG tablet Take 1 tablet by  mouth at bedtime.    Marland Kitchen RAPAFLO 8 MG CAPS capsule TAKE ONE CAPSULE BY MOUTH ONCE DAILY WITH BREAKFAST 30 capsule 0  . SAW PALMETTO-PUMPKIN SEED OIL PO Take 1 capsule by mouth daily.     . simvastatin (ZOCOR) 20 MG tablet Take 20 mg by mouth daily at 6 PM.     . vitamin B-12 (CYANOCOBALAMIN) 500 MCG tablet Take 500 mcg by mouth daily.     . vitamin E 400 UNIT capsule Take 400 Units by mouth daily.     . cephALEXin (KEFLEX) 500 MG capsule Take 1 capsule (500 mg total) by mouth 3 (three) times daily. 21 capsule 0  . levothyroxine (SYNTHROID, LEVOTHROID) 50 MCG tablet Take 50 mcg by mouth daily before breakfast.     . pantoprazole (PROTONIX) 40 MG tablet Take 40 mg by mouth daily.     . potassium chloride (MICRO-K) 10 MEQ CR capsule Take 10 mEq by mouth daily.      No current facility-administered medications for this visit.      Marland Kitchen  PHYSICAL EXAMINATION: ECOG PERFORMANCE STATUS: 3 - Symptomatic, >50% confined to bed  Filed Vitals:   05/11/15 1132  BP: 107/69  Pulse: 67  Temp: 97.2 F (36.2 C)   Filed Weights   05/11/15 1132  Weight: 154 lb (69.854 kg)     GENERAL: Well-nourished well-developed; Alert, no distress and comfortable. Accompanied by his daughter-in-law. He is in a wheelchair.  EYES: no pallor or icterus OROPHARYNX: no thrush or ulceration; good dentition  NECK: supple, no masses felt LYMPH: no palpable lymphadenopathy in the cervical, axillary or inguinal regions LUNGDecreased breath sounds on the right side. No wheeze or crackles HEART/CVS: regular rate & rhythm and no murmurs; No lower extremity edema ABDOMEN: abdomen soft, non-tender and normal bowel sounds Musculoskeletal:no cyanosis of digits and no clubbing  PSYCH: alert & oriented x 3 with fluent speech NEURO: no focal motor/sensory deficits SKIN: Mild erythema of the right forearm. 2-3 cm in size.   LABORATORY DATA:  I have reviewed the data as listed Lab Results  Component Value Date   WBC  12.3* 05/11/2015   HGB 14.8 05/11/2015   HCT 44.3 05/11/2015   MCV 87.4 05/11/2015   PLT 217 05/11/2015    Recent Labs  02/14/15 1249 05/11/15 1045  NA 135 137  K 3.8 4.2  CL 101 104  CO2 27 27  GLUCOSE 100* 87  BUN 19 23*  CREATININE 0.86 1.01  CALCIUM 9.5 9.4  GFRNONAA >60 >60  GFRAA >60 >60  PROT 8.6* 8.6*  ALBUMIN 4.0 3.8  AST 22 20  ALT 17 14*  ALKPHOS 100 92  BILITOT 0.4 0.6    RADIOGRAPHIC STUDIES: I have personally reviewed the radiological images as listed and agreed with the findings in the report. Dg Chest 2 View  05/11/2015  CLINICAL DATA:  Severe shortness of breath. History of lung cancer. History of CHF. EXAM: CHEST  2 VIEW COMPARISON:  04/04/2015, 02/14/2015. PET-CT 03/02/2015. Chest CT 01/19/2015 FINDINGS: Cardiac pacer stable position. Prior cardiac valve replacement. Heart size stable. Stable bilateral chronic interstitial changes particularly on the right with stable changes of pleural parenchymal scarring and retraction on the right. No acute infiltrate noted. No pneumothorax. Degenerative changes thoracic spine. IMPRESSION: 1. Stable chronic interstitial changes particularly on the right with stable right sided dense pleural parenchymal thickening and retraction consistent with scarring. 2. Cardiac pacer stable position. Prior cardiac valve replacement. Heart size stable . Electronically Signed   By: Marcello Moores  Register   On: 05/11/2015 10:52    ASSESSMENT & PLAN:   # SQUAMOUS CELL LUNG CANCER- at least stage III/locally advanced status post chemoradiation [finished June 2015 [place]]. PET- January 2017- no obvious evidence of recurrence; however patient declined biopsy of the mass in the right lower lobe. Clinically I suspect that radiation fibrosis changes rather than active malignancy.  # Chylous effusion of the right lung/complex- the etiology is unclear. But I suspect a combination of previous pneumomediastinum [2016 unclear etiology]; prior open-heart  surgery; also previous radiation. Stable/ chest x-ray from today reviewed- no worsening. Clinically patient is stable. Monitor for now.  # Question cellulitis of the right upper extremity- patient given a prescription for Keflex in case he needs to take antibiotics at the [facility] this has not improved.  # patient will follow-up with me in 6 months with labs/chest x-ray. Discussed with the patient that if he is symptomatic with worsening shortness of breath he needs to call us/ will likely need further evaluation with a chest x-ray and possible thoracentesis if the fluid builds up again.    Cammie Sickle, MD 05/11/2015 2:32 PM

## 2015-05-18 ENCOUNTER — Emergency Department: Payer: Medicare Other

## 2015-05-18 ENCOUNTER — Emergency Department
Admission: EM | Admit: 2015-05-18 | Discharge: 2015-05-18 | Disposition: A | Discharge status: Home / Self Care | Payer: Medicare Other | Attending: Emergency Medicine | Admitting: Emergency Medicine

## 2015-05-18 ENCOUNTER — Encounter: Payer: Self-pay | Admitting: Emergency Medicine

## 2015-05-18 DIAGNOSIS — Z880 Allergy status to penicillin: Secondary | ICD-10-CM | POA: Insufficient documentation

## 2015-05-18 DIAGNOSIS — Z881 Allergy status to other antibiotic agents status: Secondary | ICD-10-CM | POA: Insufficient documentation

## 2015-05-18 DIAGNOSIS — N390 Urinary tract infection, site not specified: Secondary | ICD-10-CM | POA: Insufficient documentation

## 2015-05-18 DIAGNOSIS — E7800 Pure hypercholesterolemia, unspecified: Secondary | ICD-10-CM | POA: Insufficient documentation

## 2015-05-18 DIAGNOSIS — Z79899 Other long term (current) drug therapy: Secondary | ICD-10-CM | POA: Diagnosis not present

## 2015-05-18 DIAGNOSIS — Z87891 Personal history of nicotine dependence: Secondary | ICD-10-CM | POA: Insufficient documentation

## 2015-05-18 DIAGNOSIS — I5032 Chronic diastolic (congestive) heart failure: Secondary | ICD-10-CM | POA: Diagnosis not present

## 2015-05-18 DIAGNOSIS — Z7982 Long term (current) use of aspirin: Secondary | ICD-10-CM | POA: Diagnosis not present

## 2015-05-18 DIAGNOSIS — Z95810 Presence of automatic (implantable) cardiac defibrillator: Secondary | ICD-10-CM | POA: Insufficient documentation

## 2015-05-18 DIAGNOSIS — E1140 Type 2 diabetes mellitus with diabetic neuropathy, unspecified: Secondary | ICD-10-CM | POA: Diagnosis not present

## 2015-05-18 DIAGNOSIS — Z85118 Personal history of other malignant neoplasm of bronchus and lung: Secondary | ICD-10-CM | POA: Diagnosis not present

## 2015-05-18 DIAGNOSIS — E039 Hypothyroidism, unspecified: Secondary | ICD-10-CM | POA: Insufficient documentation

## 2015-05-18 DIAGNOSIS — R509 Fever, unspecified: Secondary | ICD-10-CM | POA: Diagnosis present

## 2015-05-18 LAB — COMPREHENSIVE METABOLIC PANEL
ALT: 13 U/L — ABNORMAL LOW (ref 17–63)
ANION GAP: 8 (ref 5–15)
AST: 26 U/L (ref 15–41)
Albumin: 3.5 g/dL (ref 3.5–5.0)
Alkaline Phosphatase: 77 U/L (ref 38–126)
BUN: 24 mg/dL — ABNORMAL HIGH (ref 6–20)
CHLORIDE: 100 mmol/L — AB (ref 101–111)
CO2: 25 mmol/L (ref 22–32)
Calcium: 9.1 mg/dL (ref 8.9–10.3)
Creatinine, Ser: 0.91 mg/dL (ref 0.61–1.24)
GFR calc non Af Amer: >60 mL/min (ref >60)
Glucose, Bld: 94 mg/dL (ref 65–99)
POTASSIUM: 4.2 mmol/L (ref 3.5–5.1)
SODIUM: 133 mmol/L — AB (ref 135–145)
Total Bilirubin: 0.7 mg/dL (ref 0.3–1.2)
Total Protein: 7.9 g/dL (ref 6.5–8.1)

## 2015-05-18 LAB — CBC
HEMATOCRIT: 41 % (ref 40.0–52.0)
HEMOGLOBIN: 13.9 g/dL (ref 13.0–18.0)
MCH: 29.9 pg (ref 26.0–34.0)
MCHC: 33.9 g/dL (ref 32.0–36.0)
MCV: 88.1 fL (ref 80.0–100.0)
Platelets: 163 10*3/uL (ref 150–440)
RBC: 4.66 MIL/uL (ref 4.40–5.90)
RDW: 17.1 % — ABNORMAL HIGH (ref 11.5–14.5)
WBC: 5.9 10*3/uL (ref 3.8–10.6)

## 2015-05-18 LAB — URINALYSIS COMPLETE WITH MICROSCOPIC (ARMC ONLY)
Bilirubin Urine: NEGATIVE
Glucose, UA: NEGATIVE mg/dL
KETONES UR: NEGATIVE mg/dL
Nitrite: POSITIVE — AB
PH: 6 (ref 5.0–8.0)
PROTEIN: NEGATIVE mg/dL
Specific Gravity, Urine: 1.01 (ref 1.005–1.030)

## 2015-05-18 LAB — LIPASE, BLOOD: LIPASE: 15 U/L (ref 11–51)

## 2015-05-18 LAB — TROPONIN I: Troponin I: <0.03 ng/mL (ref <0.031)

## 2015-05-18 MED ORDER — ONDANSETRON HCL 4 MG/2ML IJ SOLN
INTRAMUSCULAR | Status: AC
  Administered 2015-05-18: 4 mg via INTRAVENOUS
  Filled 2015-05-18: qty 2

## 2015-05-18 MED ORDER — CEPHALEXIN 500 MG PO CAPS: 500.0000 mg | ORAL_CAPSULE | Freq: Three times a day (TID) | ORAL | Status: AC

## 2015-05-18 MED ORDER — DEXTROSE 5 % IV SOLN
1.0000 g | Freq: Once | INTRAVENOUS | Status: AC
  Administered 2015-05-18: 1 g via INTRAVENOUS
  Filled 2015-05-18: qty 10

## 2015-05-18 MED ORDER — ONDANSETRON HCL 4 MG/2ML IJ SOLN
4.0000 mg | Freq: Once | INTRAMUSCULAR | Status: AC
  Administered 2015-05-18: 4 mg via INTRAVENOUS

## 2015-05-18 NOTE — ED Provider Notes (Addendum)
[facility] Emergency Department Provider Note  ____________________________________________  Time seen: Approximately 277 PM  I have reviewed the triage vital signs and the nursing notes.   HISTORY  Chief Complaint Fever and Nausea    HPI Russell Howard is a 81 y.o. male with a history of urethral stricture and multiple UTIs who is presenting today with lower abdominal cramping as well as nausea. He says he is also experienced an increased cough over the past week and recently had yellow sputum. He has a history of lung cancer and says that he has multiple pneumonias in the past. He also says that he has had fever at home. He says that this feels exactly like past urinary tract infections and has had multiple urinary tract infections over the past several months. He says that Levaquin and Keflex have resolved as urinary tract infections in the past.Patient says "I think it's a UTI."   Past Medical History  Diagnosis Date  . CHF (congestive heart failure) ([place])   . Lung cancer ([place])   . Valvular heart disease   . Cardiomyopathy ([place])   . Acquired hypothyroidism 11/02/2014  . Chronic diastolic heart failure ([place]) 11/02/2014  . Diabetes mellitus without complication ([place]) 05/15/8784  . Cardiac defibrillator in place 11/02/2014  . Gastro-esophageal reflux disease without esophagitis 11/02/2014  . History of atrial fibrillation 11/02/2014  . H/O malignant neoplasm 11/09/2014  . Neuropathy ([place]) 11/02/2014  . Orthostasis 11/02/2014  . Pure hypercholesterolemia 11/02/2014  . Heart valve disease 11/02/2014  . BPH (benign prostatic hyperplasia)     Patient Active Problem List   Diagnosis Date Noted  . H/O malignant neoplasm 11/09/2014  . Acquired hypothyroidism 11/02/2014  . Cardiomyopathy ([place]) 11/02/2014  . Chronic diastolic heart failure ([place]) 11/02/2014  . Diabetes mellitus without complication ([place]) 76/72/0947  . Cardiac defibrillator in place 11/02/2014  .  Gastro-esophageal reflux disease without esophagitis 11/02/2014  . History of atrial fibrillation 11/02/2014  . BP (high blood pressure) 11/02/2014  . Neuropathy ([place]) 11/02/2014  . Orthostasis 11/02/2014  . Pure hypercholesterolemia 11/02/2014  . Heart valve disease 11/02/2014    Past Surgical History  Procedure Laterality Date  . Appendectomy    . Dual icd implant  2007/2009    implantable cardiac defibrillator  . Port-a-cath removal      Current Outpatient Rx  Name  Route  Sig  Dispense  Refill  . Ascorbic Acid (VITAMIN C) 1000 MG tablet   Oral   Take by mouth.         Marland Kitchen aspirin EC 81 MG tablet   Oral   Take by mouth.         . calcium-vitamin D (SM CALCIUM 500/VITAMIN D3) 500-400 MG-UNIT tablet   Oral   Take by mouth.         . cephALEXin (KEFLEX) 500 MG capsule   Oral   Take 1 capsule (500 mg total) by mouth 3 (three) times daily.   21 capsule   0   . Coenzyme Q10 (TH CO Q-10) 100 MG capsule   Oral   Take by mouth.         . digoxin (LANOXIN) 0.125 MG tablet   Oral   Take by mouth.         . furosemide (LASIX) 20 MG tablet   Oral   Take by mouth.         . gabapentin (NEURONTIN) 300 MG capsule   Oral   Take 300 mg by  mouth 3 (three) times daily.          . Glucosamine-Chondroit-Vit C-Mn (GLUCOSAMINE-CHONDROITIN) CAPS   Oral   Take 1 capsule by mouth 3 (three) times daily.          . hydrocortisone (CORTEF) 10 MG tablet   Oral   Take by mouth 2 (two) times daily.          Marland Kitchen EXPIRED: levothyroxine (SYNTHROID, LEVOTHROID) 50 MCG tablet   Oral   Take 50 mcg by mouth daily before breakfast.          . levothyroxine (SYNTHROID, LEVOTHROID) 50 MCG tablet   Oral   Take by mouth.         . Lutein 6 MG CAPS   Oral   Take 1 capsule by mouth daily.          . pantoprazole (PROTONIX) 40 MG tablet   Oral   Take 40 mg by mouth daily.          . potassium chloride (MICRO-K) 10 MEQ CR capsule   Oral   Take 10 mEq by mouth  daily.          Marland Kitchen pyridostigmine (MESTINON) 60 MG tablet               . ranitidine (ZANTAC) 300 MG tablet   Oral   Take 1 tablet by mouth at bedtime.         Marland Kitchen RAPAFLO 8 MG CAPS capsule      TAKE ONE CAPSULE BY MOUTH ONCE DAILY WITH BREAKFAST   30 capsule   0   . SAW PALMETTO-PUMPKIN SEED OIL PO   Oral   Take 1 capsule by mouth daily.          . simvastatin (ZOCOR) 20 MG tablet   Oral   Take 20 mg by mouth daily at 6 PM.          . vitamin B-12 (CYANOCOBALAMIN) 500 MCG tablet   Oral   Take 500 mcg by mouth daily.          . vitamin E 400 UNIT capsule   Oral   Take 400 Units by mouth daily.            Allergies Penicillin g and Sulfa antibiotics  Family History  Problem Relation Age of Onset  . Hypertension    . Heart disease      Social History Social History  Substance Use Topics  . Smoking status: Former Smoker    Types: Cigarettes    Quit date: 11/21/1981  . Smokeless tobacco: Never Used  . Alcohol Use: No    Review of Systems Constitutional: The objective fever at home Eyes: No visual changes. ENT: No sore throat. Cardiovascular: Denies chest pain. Respiratory: Denies shortness of breath. Gastrointestinal: N no vomiting.  No diarrhea.  No constipation. Genitourinary: Negative for dysuria.  Denies burning with urination. Musculoskeletal: Negative for back pain. Skin: Negative for rash. Neurological: Negative for headaches, focal weakness or numbness.  10-point ROS otherwise negative.  ____________________________________________   PHYSICAL EXAM:  VITAL SIGNS: ED Triage Vitals  Enc Vitals Group     BP 05/18/15 1810 114/60 mmHg     Pulse Rate 05/18/15 1810 70     Resp 05/18/15 1810 18     Temp 05/18/15 1810 99.6 F (37.6 C)     Temp Source 05/18/15 1810 Oral     SpO2 05/18/15 1810 98 %     Weight 05/18/15 1810  154 lb (69.854 kg)     Height 05/18/15 1810 '5\' 10"'$  (1.778 m)     Head Cir --      Peak Flow --      Pain  Score 05/18/15 1812 0     Pain Loc --      Pain Edu? --      Excl. in [place]? --     Constitutional: Alert and oriented. Well appearing and in no acute distress. Eyes: Conjunctivae are normal. PERRL. EOMI. Head: Atraumatic. Nose: Clear rhinorrhea to the bilateral nares Mouth/Throat: Mucous membranes are moist.   Neck: No stridor.   Cardiovascular: Normal rate, regular rhythm. Grossly normal heart sounds.  Good peripheral circulation. Respiratory: Normal respiratory effort.  No retractions. Decreased lung sounds to the right lung field with rales to the bases. The patient says that he always has abnormal lung sounds on the side secondary to cancer, radiation multiple pneumonias. Gastrointestinal: Soft and nontender. No distention. No CVA tenderness. Musculoskeletal: No lower extremity tenderness nor edema.  No joint effusions. Neurologic:  Normal speech and language. No gross focal neurologic deficits are appreciated.  Skin:  Skin is warm, dry and intact. No rash noted. Psychiatric: Mood and affect are normal. Speech and behavior are normal.  ____________________________________________   LABS (all labs ordered are listed, but only abnormal results are displayed)  Labs Reviewed  COMPREHENSIVE METABOLIC PANEL - Abnormal; Notable for the following:    Sodium 133 (*)    Chloride 100 (*)    BUN 24 (*)    ALT 13 (*)    All other components within normal limits  CBC - Abnormal; Notable for the following:    RDW 17.1 (*)    All other components within normal limits  URINALYSIS COMPLETEWITH MICROSCOPIC ([facility] ONLY) - Abnormal; Notable for the following:    Color, Urine YELLOW (*)    APPearance HAZY (*)    Hgb urine dipstick 1+ (*)    Nitrite POSITIVE (*)    Leukocytes, UA 3+ (*)    Bacteria, UA FEW (*)    Squamous Epithelial / LPF 0-5 (*)    All other components within normal limits  LIPASE, BLOOD  TROPONIN I   ____________________________________________  EKG  ED ECG REPORT I,  Doran Stabler, the attending physician, personally viewed and interpreted this ECG.   Date: 05/18/2015  EKG Time: 1818  Rate: 70  Rhythm: Biventricular pacing  Axis: Normal axis  Intervals:Wide-complex QRS consistent with ventricular pacing  ST&T Change: No ST elevations or depressions. T-wave inversions in aVL as well as V2.  ____________________________________________  RADIOLOGY   IMPRESSION: Stable chronic changes RIGHT lung with volume loss, pleural thickening, interstitial prominence and underlying consolidation.   Electronically Signed By: Elon Alas M.D. On: 05/18/2015 22:54 ____________________________________________   PROCEDURES    ____________________________________________   INITIAL IMPRESSION / ASSESSMENT AND PLAN / ED COURSE  Pertinent labs & imaging results that were available during my care of the patient were reviewed by me and considered in my medical decision making (see chart for details).  ----------------------------------------- 11:25 PM on 05/18/2015 -----------------------------------------  Patient is resting comfortably at this time. Discussed the lab as well as imaging results with the patient and his family who is his daughter-in-law at the bedside and who is also power of attorney. I'll be discharging the patient on Keflex. He has chronic changes on his lungs with no acute findings. Will be treated for UTI. We'll send culture. ____________________________________________   FINAL CLINICAL IMPRESSION(S) /  ED DIAGNOSES  Urinary tract infection.    Orbie Pyo, MD 05/18/15 2326  Patient nontoxic in appearance. Normal blood cell count and afebrile here in the emergency department. Given a dose of ceftriaxone prior to discharge.  Orbie Pyo, MD 05/18/15 [phone]

## 2015-05-18 NOTE — ED Notes (Signed)
C/O body aches, nausea, fever x 1 day.

## 2015-05-18 NOTE — ED Notes (Signed)
Pt to xray

## 2015-05-18 NOTE — ED Notes (Signed)
MD at bedside. 

## 2015-05-23 NOTE — Progress Notes (Signed)
Emergency Department Discharge Culture Review  81 yo male with ED visit on 05/18/15 for c/o urinary symptoms. Hx recurrent UTIs. Patient self-catheterizes twice daily. Last UTI ~ 7weeks ago- given Levaquin.  05/18/15 Urine cx: -80,000 Staph Aureus(MRSA)-resistant to                                           Oxacillin, cipro       -10,000 Enterococcus faecalis-resistant to                                           Levaquin Patient discharged on Cephalexin 05/18/15.  Paged Dr. Ola Spurr, [place] MD for recommendation. Per MD will change to Doxycycline '100mg'$  po bid x 10 days. MD suggested f/u with urologist.  Called patient concerning pharmacy preference [phone]). Patient was actually at Primary MD office when I called for f/u b/c patient stated he was not getting better. Told patient Dr. Ola Spurr suggested f/u with urologist.  Prescription for Doxycyline as above called into [facility] on [address] [phone]). ~1600 05/23/15.  Chinita Greenland PharmD Clinical Pharmacist 05/23/2015 4:18 PM

## 2015-05-31 ENCOUNTER — Encounter: Payer: Self-pay | Admitting: Urology

## 2015-05-31 ENCOUNTER — Other Ambulatory Visit: Payer: Self-pay | Admitting: Internal Medicine

## 2015-05-31 ENCOUNTER — Ambulatory Visit
Admission: RE | Admit: 2015-05-31 | Discharge: 2015-05-31 | Disposition: A | Discharge status: Home / Self Care | Payer: Medicare Other | Source: Ambulatory Visit | Attending: Urology | Admitting: Urology

## 2015-05-31 ENCOUNTER — Ambulatory Visit (INDEPENDENT_AMBULATORY_CARE_PROVIDER_SITE_OTHER): Payer: Medicare Other | Admitting: Urology

## 2015-05-31 ENCOUNTER — Ambulatory Visit
Admission: RE | Admit: 2015-05-31 | Discharge: 2015-05-31 | Disposition: A | Discharge status: Home / Self Care | Payer: Medicare Other | Source: Ambulatory Visit | Attending: Internal Medicine | Admitting: Internal Medicine

## 2015-05-31 VITALS — BP 100/61 | HR 71 | Temp 97.3°F | Resp 16

## 2015-05-31 DIAGNOSIS — Z87448 Personal history of other diseases of urinary system: Secondary | ICD-10-CM

## 2015-05-31 DIAGNOSIS — N390 Urinary tract infection, site not specified: Secondary | ICD-10-CM | POA: Insufficient documentation

## 2015-05-31 DIAGNOSIS — C3491 Malignant neoplasm of unspecified part of right bronchus or lung: Secondary | ICD-10-CM

## 2015-05-31 DIAGNOSIS — J90 Pleural effusion, not elsewhere classified: Secondary | ICD-10-CM

## 2015-05-31 DIAGNOSIS — R339 Retention of urine, unspecified: Secondary | ICD-10-CM

## 2015-05-31 MED ORDER — METHENAMINE HIPPURATE 1 G PO TABS: 1.0000 g | ORAL_TABLET | Freq: Two times a day (BID) | ORAL | Status: DC

## 2015-05-31 NOTE — Progress Notes (Signed)
05/31/2015 4:36 PM   Tytus Dorman 1934/12/11 [id number]  Referring provider: [name], MD [facility] [place], [place] [address]  Chief Complaint  Patient presents with  . Recurrent UTI    HPI: 81 year old male with stage III lung cancer, urinary retention on clean intermittent catheterization, and recurrent urinary tract infections.  Urinary retention Patient has been successful clean intermittent catheterization since his failed void trial He has been successful at catheterizing himself twice daily with excellent technique Void intermittently throughout the day but has fairly significant post void residual with each catheterization Overall, no significant urinary complaints today. Nocturia 1.  History of urethral stricture S/p in office urethral dilation by Dr. Elnoria Howard 11/2014 with filiform and followers  Recurrent UTIs Reports being treated proximally once per month over the past 6 months for urinary tract infection. With each infection, he has generalized malaise, fatigue, and feels just "crappy".  Generally, he is had no significant associated urinary symptoms with the infection other than on 1 single occasion (urinary frequency/lower abdominal pressure). Review of  most recent urine culture obtained in the ER on 05/18/2015 shows MRSA and enterococcus.  On this occasion, he did also have an associated fever.  No other culture data for review  His feel that his symptoms do improve with antibiotics.  Today, he denies any UTI symptoms.   PMH: Past Medical History  Diagnosis Date  . CHF (congestive heart failure) ([place])   . Lung cancer ([place])   . Valvular heart disease   . Cardiomyopathy ([place])   . Acquired hypothyroidism 11/02/2014  . Chronic diastolic heart failure ([place]) 11/02/2014  . Diabetes mellitus without complication ([place]) 5/63/1497  . Cardiac defibrillator in place 11/02/2014  . Gastro-esophageal reflux disease without esophagitis  11/02/2014  . History of atrial fibrillation 11/02/2014  . H/O malignant neoplasm 11/09/2014  . Neuropathy ([place]) 11/02/2014  . Orthostasis 11/02/2014  . Pure hypercholesterolemia 11/02/2014  . Heart valve disease 11/02/2014  . BPH (benign prostatic hyperplasia)     Surgical History: Past Surgical History  Procedure Laterality Date  . Appendectomy    . Dual icd implant  2007/2009    implantable cardiac defibrillator  . Port-a-cath removal      Home Medications:    Medication List       This list is accurate as of: 05/31/15  4:36 PM.  Always use your most recent med list.               aspirin EC 81 MG tablet  Take by mouth.     digoxin 0.125 MG tablet  Commonly known as:  LANOXIN  Take by mouth.     furosemide 20 MG tablet  Commonly known as:  LASIX  Take by mouth every other day.     gabapentin 300 MG capsule  Commonly known as:  NEURONTIN  Take 300 mg by mouth 3 (three) times daily.     Glucosamine-Chondroitin Caps  Take 1 capsule by mouth 3 (three) times daily.     hydrocortisone 10 MG tablet  Commonly known as:  CORTEF  Take by mouth 2 (two) times daily.     levothyroxine 50 MCG tablet  Commonly known as:  SYNTHROID, LEVOTHROID  Take 50 mcg by mouth daily before breakfast.     Lutein 6 MG Caps  Take 1 capsule by mouth daily.     methenamine 1 g tablet  Commonly known as:  HIPREX  Take 1 tablet (1 g total) by  mouth 2 (two) times daily with a meal.     pantoprazole 40 MG tablet  Commonly known as:  PROTONIX  Take 40 mg by mouth daily.     potassium chloride 10 MEQ CR capsule  Commonly known as:  MICRO-K  Take 10 mEq by mouth daily.     pyridostigmine 60 MG tablet  Commonly known as:  MESTINON     ranitidine 300 MG tablet  Commonly known as:  ZANTAC  Take 1 tablet by mouth at bedtime.     RAPAFLO 8 MG Caps capsule  Generic drug:  silodosin  TAKE ONE CAPSULE BY MOUTH ONCE DAILY WITH BREAKFAST     SAW PALMETTO-PUMPKIN SEED OIL PO  Take 1  capsule by mouth daily.     simvastatin 20 MG tablet  Commonly known as:  ZOCOR  Take 20 mg by mouth daily at 6 PM.     SM CALCIUM 500/VITAMIN D3 500-400 MG-UNIT tablet  Generic drug:  calcium-vitamin D  Take by mouth.     TH CO Q-10 100 MG capsule  Generic drug:  Coenzyme Q10  Take by mouth.     vitamin B-12 500 MCG tablet  Commonly known as:  CYANOCOBALAMIN  Take 500 mcg by mouth daily.     vitamin C 1000 MG tablet  Take by mouth.     vitamin E 400 UNIT capsule  Take 400 Units by mouth daily.        Allergies:  Allergies  Allergen Reactions  . Penicillin G Hives  . Sulfa Antibiotics Rash    Family History: Family History  Problem Relation Age of Onset  . Hypertension    . Heart disease      Social History:  reports that he quit smoking about 33 years ago. His smoking use included Cigarettes. He has never used smokeless tobacco. He reports that he does not drink alcohol or use illicit drugs.  ROS: UROLOGY Frequent Urination?: No Hard to postpone urination?: No Burning/pain with urination?: No Get up at night to urinate?: No Leakage of urine?: No Urine stream starts and stops?: No Trouble starting stream?: Yes Do you have to strain to urinate?: Yes Blood in urine?: No Urinary tract infection?: Yes Sexually transmitted disease?: No Injury to kidneys or bladder?: No Painful intercourse?: No Weak stream?: No Erection problems?: No Penile pain?: No  Gastrointestinal Nausea?: No Vomiting?: No Indigestion/heartburn?: No Diarrhea?: No Constipation?: No  Constitutional Fever: No Night sweats?: No Weight loss?: No Fatigue?: Yes  Skin Skin rash/lesions?: No Itching?: No  Eyes Blurred vision?: No Double vision?: No  Ears/Nose/Throat Sore throat?: No Sinus problems?: No  Hematologic/Lymphatic Swollen glands?: No Easy bruising?: No  Cardiovascular Leg swelling?: No Chest pain?: No  Respiratory Cough?: No Shortness of breath?:  No  Endocrine Excessive thirst?: No  Musculoskeletal Back pain?: No Joint pain?: No  Neurological Headaches?: No Dizziness?: No  Psychologic Depression?: No Anxiety?: No  Physical Exam: BP 100/61 mmHg  Pulse 71  Temp(Src) 97.3 F (36.3 C) (Oral)  Resp 16  Constitutional:  Alert and oriented, No acute distress.  Presents with daughter.  In wheelchair. HEENT: [place] AT, moist mucus membranes.  Trachea midline, no masses. Cardiovascular: No clubbing, cyanosis, or edema. Respiratory: Normal respiratory effort, no increased work of breathing.  Skin: No rashes, bruises or suspicious lesions. Neurologic: Grossly intact, no focal deficits, moving all 4 extremities. Psychiatric: Normal mood and affect.  Laboratory Data: Lab Results  Component Value Date   WBC 5.9 05/18/2015  HGB 13.9 05/18/2015   HCT 41.0 05/18/2015   MCV 88.1 05/18/2015   PLT 163 05/18/2015    Lab Results  Component Value Date   CREATININE 0.91 05/18/2015   Urinalysis Results for orders placed or performed during the hospital encounter of 05/18/15  Urine culture  Result Value Ref Range   Specimen Description URINE, RANDOM    Special Requests NONE    Culture (A)     80,000 COLONIES/mL STAPHYLOCOCCUS AUREUS 10,000 COLONIES/mL ENTEROCOCCUS FAECALIS    Report Status 05/22/2015 FINAL    Organism ID, Bacteria STAPHYLOCOCCUS AUREUS (A)    Organism ID, Bacteria ENTEROCOCCUS FAECALIS (A)       Susceptibility   Staphylococcus aureus - MIC*    CIPROFLOXACIN >=8 RESISTANT Resistant     GENTAMICIN <=0.5 SENSITIVE Sensitive     NITROFURANTOIN <=16 SENSITIVE Sensitive     OXACILLIN >=4 RESISTANT Resistant     TETRACYCLINE <=1 SENSITIVE Sensitive     VANCOMYCIN 1 SENSITIVE Sensitive     TRIMETH/SULFA <=10 SENSITIVE Sensitive     CLINDAMYCIN <=0.25 SENSITIVE Sensitive     RIFAMPIN <=0.5 SENSITIVE Sensitive     Inducible Clindamycin NEGATIVE Sensitive     * 80,000 COLONIES/mL STAPHYLOCOCCUS AUREUS    Enterococcus faecalis - MIC*    AMPICILLIN <=2 SENSITIVE Sensitive     LEVOFLOXACIN >=8 RESISTANT Resistant     NITROFURANTOIN <=16 SENSITIVE Sensitive     VANCOMYCIN 1 SENSITIVE Sensitive     LINEZOLID 2 SENSITIVE Sensitive     * 10,000 COLONIES/mL ENTEROCOCCUS FAECALIS  Lipase, blood  Result Value Ref Range   Lipase 15 11 - 51 U/L  Comprehensive metabolic panel  Result Value Ref Range   Sodium 133 (L) 135 - 145 mmol/L   Potassium 4.2 3.5 - 5.1 mmol/L   Chloride 100 (L) 101 - 111 mmol/L   CO2 25 22 - 32 mmol/L   Glucose, Bld 94 65 - 99 mg/dL   BUN 24 (H) 6 - 20 mg/dL   Creatinine, Ser 0.91 0.61 - 1.24 mg/dL   Calcium 9.1 8.9 - 10.3 mg/dL   Total Protein 7.9 6.5 - 8.1 g/dL   Albumin 3.5 3.5 - 5.0 g/dL   AST 26 15 - 41 U/L   ALT 13 (L) 17 - 63 U/L   Alkaline Phosphatase 77 38 - 126 U/L   Total Bilirubin 0.7 0.3 - 1.2 mg/dL   GFR calc non Af Amer >60 >60 mL/min   GFR calc Af Amer >60 >60 mL/min   Anion gap 8 5 - 15  CBC  Result Value Ref Range   WBC 5.9 3.8 - 10.6 K/uL   RBC 4.66 4.40 - 5.90 MIL/uL   Hemoglobin 13.9 13.0 - 18.0 g/dL   HCT 41.0 40.0 - 52.0 %   MCV 88.1 80.0 - 100.0 fL   MCH 29.9 26.0 - 34.0 pg   MCHC 33.9 32.0 - 36.0 g/dL   RDW 17.1 (H) 11.5 - 14.5 %   Platelets 163 150 - 440 K/uL  Urinalysis complete, with microscopic ([facility] only)  Result Value Ref Range   Color, Urine YELLOW (A) YELLOW   APPearance HAZY (A) CLEAR   Glucose, UA NEGATIVE NEGATIVE mg/dL   Bilirubin Urine NEGATIVE NEGATIVE   Ketones, ur NEGATIVE NEGATIVE mg/dL   Specific Gravity, Urine 1.010 1.005 - 1.030   Hgb urine dipstick 1+ (A) NEGATIVE   pH 6.0 5.0 - 8.0   Protein, ur NEGATIVE NEGATIVE mg/dL   Nitrite POSITIVE (A) NEGATIVE  Leukocytes, UA 3+ (A) NEGATIVE   RBC / HPF 6-30 0 - 5 RBC/hpf   WBC, UA TOO NUMEROUS TO COUNT 0 - 5 WBC/hpf   Bacteria, UA FEW (A) NONE SEEN   Squamous Epithelial / LPF 0-5 (A) NONE SEEN   Mucous PRESENT   Troponin I  Result Value Ref Range   Troponin I  <0.03 <0.031 ng/mL    Pertinent Imaging: KUB ordered today  Assessment & Plan:    1. Recurrent UTI Lengthy discussion today reviewing clean intermittent catheterization technique  Discussed with the patient and his daughter about bacterial colonization in the setting of CIC, advised that each time we check a urine, he will likely have what appears to be a urinary tract infection which is in reality simply colonization.  As such, it will be very difficult to assess whether or not he is a true infection and should only be treated if symptomatic. Additionally, other sources of infections/ etiology of nonspecific symptoms should be investigated.  Agreed to pursue KUB today to rule out kidney stones and bladder stone especially given his history of BPH  Past antibioticstewardship, concern for developing antibiotic resistance (histoy of MRSA), development of bacterial overgrowth/C. difficile, and other complications from long term antibiotics. As such, I would like to defer this as a last resort.  Plan for for initiation of methenamine for urinary acidification. In addition, recommend continuation of probiotic.  - Urinalysis, Complete - methenamine (HIPREX) 1 g tablet; Take 1 tablet (1 g total) by mouth 2 (two) times daily with a meal.  Dispense: 60 tablet; Refill: 3 - DG Abd 1 View  2. Urinary retention Continue CIC bid  3. History of urethral stricture As above, CICI to keep urethral patient   Return in about 3 months (around 08/30/2015) for recheck UTIs.  Hollice Espy, MD  [facility] [address] [place], [place] [address] [phone]

## 2015-06-04 ENCOUNTER — Telehealth: Payer: Self-pay

## 2015-06-04 NOTE — Telephone Encounter (Signed)
Spoke with pt in reference to no kidney stones or bladder stones. Pt voiced understanding.

## 2015-06-04 NOTE — Telephone Encounter (Signed)
-----   Message from Hollice Espy, MD sent at 05/31/2015  4:09 PM EDT ----- Please let this patient know there is no evidence of kidney or bladder stones contributing to his infections.  This is good news.

## 2015-06-14 ENCOUNTER — Encounter: Payer: Self-pay | Admitting: Emergency Medicine

## 2015-06-14 ENCOUNTER — Inpatient Hospital Stay
Admission: EM | Admit: 2015-06-14 | Discharge: 2015-06-18 | DRG: 871 | Disposition: A | Discharge status: Home Health | Payer: Medicare Other | Attending: Internal Medicine | Admitting: Internal Medicine

## 2015-06-14 ENCOUNTER — Inpatient Hospital Stay: Payer: Medicare Other

## 2015-06-14 ENCOUNTER — Emergency Department: Payer: Medicare Other

## 2015-06-14 DIAGNOSIS — I2699 Other pulmonary embolism without acute cor pulmonale: Secondary | ICD-10-CM

## 2015-06-14 DIAGNOSIS — Z880 Allergy status to penicillin: Secondary | ICD-10-CM

## 2015-06-14 DIAGNOSIS — Z8614 Personal history of Methicillin resistant Staphylococcus aureus infection: Secondary | ICD-10-CM

## 2015-06-14 DIAGNOSIS — J189 Pneumonia, unspecified organism: Secondary | ICD-10-CM | POA: Diagnosis present

## 2015-06-14 DIAGNOSIS — Z923 Personal history of irradiation: Secondary | ICD-10-CM

## 2015-06-14 DIAGNOSIS — R339 Retention of urine, unspecified: Secondary | ICD-10-CM | POA: Diagnosis present

## 2015-06-14 DIAGNOSIS — K219 Gastro-esophageal reflux disease without esophagitis: Secondary | ICD-10-CM | POA: Diagnosis present

## 2015-06-14 DIAGNOSIS — R197 Diarrhea, unspecified: Secondary | ICD-10-CM | POA: Diagnosis present

## 2015-06-14 DIAGNOSIS — Z9221 Personal history of antineoplastic chemotherapy: Secondary | ICD-10-CM | POA: Diagnosis not present

## 2015-06-14 DIAGNOSIS — Z882 Allergy status to sulfonamides status: Secondary | ICD-10-CM

## 2015-06-14 DIAGNOSIS — I482 Chronic atrial fibrillation: Secondary | ICD-10-CM | POA: Diagnosis present

## 2015-06-14 DIAGNOSIS — A419 Sepsis, unspecified organism: Secondary | ICD-10-CM | POA: Diagnosis not present

## 2015-06-14 DIAGNOSIS — Z7982 Long term (current) use of aspirin: Secondary | ICD-10-CM

## 2015-06-14 DIAGNOSIS — Z8249 Family history of ischemic heart disease and other diseases of the circulatory system: Secondary | ICD-10-CM

## 2015-06-14 DIAGNOSIS — Z85118 Personal history of other malignant neoplasm of bronchus and lung: Secondary | ICD-10-CM | POA: Diagnosis not present

## 2015-06-14 DIAGNOSIS — Z79899 Other long term (current) drug therapy: Secondary | ICD-10-CM

## 2015-06-14 DIAGNOSIS — N400 Enlarged prostate without lower urinary tract symptoms: Secondary | ICD-10-CM | POA: Diagnosis present

## 2015-06-14 DIAGNOSIS — I110 Hypertensive heart disease with heart failure: Secondary | ICD-10-CM | POA: Diagnosis present

## 2015-06-14 DIAGNOSIS — E860 Dehydration: Secondary | ICD-10-CM | POA: Diagnosis present

## 2015-06-14 DIAGNOSIS — N390 Urinary tract infection, site not specified: Secondary | ICD-10-CM | POA: Diagnosis present

## 2015-06-14 DIAGNOSIS — Z87440 Personal history of urinary (tract) infections: Secondary | ICD-10-CM | POA: Diagnosis not present

## 2015-06-14 DIAGNOSIS — R9389 Abnormal findings on diagnostic imaging of other specified body structures: Secondary | ICD-10-CM

## 2015-06-14 DIAGNOSIS — E7800 Pure hypercholesterolemia, unspecified: Secondary | ICD-10-CM | POA: Diagnosis present

## 2015-06-14 DIAGNOSIS — I429 Cardiomyopathy, unspecified: Secondary | ICD-10-CM | POA: Diagnosis present

## 2015-06-14 DIAGNOSIS — E039 Hypothyroidism, unspecified: Secondary | ICD-10-CM | POA: Diagnosis present

## 2015-06-14 DIAGNOSIS — Z87891 Personal history of nicotine dependence: Secondary | ICD-10-CM | POA: Diagnosis not present

## 2015-06-14 DIAGNOSIS — Z95810 Presence of automatic (implantable) cardiac defibrillator: Secondary | ICD-10-CM | POA: Diagnosis not present

## 2015-06-14 DIAGNOSIS — I959 Hypotension, unspecified: Secondary | ICD-10-CM | POA: Diagnosis present

## 2015-06-14 DIAGNOSIS — I5022 Chronic systolic (congestive) heart failure: Secondary | ICD-10-CM | POA: Diagnosis present

## 2015-06-14 DIAGNOSIS — E1142 Type 2 diabetes mellitus with diabetic polyneuropathy: Secondary | ICD-10-CM | POA: Diagnosis present

## 2015-06-14 LAB — CBC
HEMATOCRIT: 39.7 % — AB (ref 40.0–52.0)
HEMOGLOBIN: 13.1 g/dL (ref 13.0–18.0)
MCH: 28.8 pg (ref 26.0–34.0)
MCHC: 32.9 g/dL (ref 32.0–36.0)
MCV: 87.4 fL (ref 80.0–100.0)
Platelets: 182 10*3/uL (ref 150–440)
RBC: 4.54 MIL/uL (ref 4.40–5.90)
RDW: 17.3 % — ABNORMAL HIGH (ref 11.5–14.5)
WBC: 17.2 10*3/uL — ABNORMAL HIGH (ref 3.8–10.6)

## 2015-06-14 LAB — URINALYSIS COMPLETE WITH MICROSCOPIC (ARMC ONLY)
BACTERIA UA: NONE SEEN
Bilirubin Urine: NEGATIVE
Glucose, UA: NEGATIVE mg/dL
Ketones, ur: NEGATIVE mg/dL
Nitrite: NEGATIVE
PH: 5 (ref 5.0–8.0)
PROTEIN: 30 mg/dL — AB
Specific Gravity, Urine: 1.014 (ref 1.005–1.030)

## 2015-06-14 LAB — COMPREHENSIVE METABOLIC PANEL
ALBUMIN: 3.3 g/dL — AB (ref 3.5–5.0)
ALK PHOS: 75 U/L (ref 38–126)
ALT: 15 U/L — AB (ref 17–63)
ANION GAP: 10 (ref 5–15)
AST: 27 U/L (ref 15–41)
BUN: 32 mg/dL — ABNORMAL HIGH (ref 6–20)
CALCIUM: 9.3 mg/dL (ref 8.9–10.3)
CO2: 22 mmol/L (ref 22–32)
Chloride: 103 mmol/L (ref 101–111)
Creatinine, Ser: 0.96 mg/dL (ref 0.61–1.24)
GFR calc Af Amer: >60 mL/min (ref >60)
GFR calc non Af Amer: >60 mL/min (ref >60)
GLUCOSE: 115 mg/dL — AB (ref 65–99)
Potassium: 4.2 mmol/L (ref 3.5–5.1)
SODIUM: 135 mmol/L (ref 135–145)
Total Bilirubin: 1.5 mg/dL — ABNORMAL HIGH (ref 0.3–1.2)
Total Protein: 8.1 g/dL (ref 6.5–8.1)

## 2015-06-14 LAB — GLUCOSE, CAPILLARY
GLUCOSE-CAPILLARY: 85 mg/dL (ref 65–99)
Glucose-Capillary: 84 mg/dL (ref 65–99)

## 2015-06-14 LAB — PROCALCITONIN: PROCALCITONIN: 0.12 ng/mL

## 2015-06-14 LAB — MAGNESIUM: MAGNESIUM: 1.7 mg/dL (ref 1.7–2.4)

## 2015-06-14 LAB — LIPASE, BLOOD: LIPASE: 15 U/L (ref 11–51)

## 2015-06-14 LAB — LACTIC ACID, PLASMA: LACTIC ACID, VENOUS: 1.2 mmol/L (ref 0.5–2.0)

## 2015-06-14 MED ORDER — SIMVASTATIN 20 MG PO TABS
20.0000 mg | ORAL_TABLET | Freq: Every day | ORAL | Status: DC
  Administered 2015-06-14 – 2015-06-17 (×4): 20 mg via ORAL
  Filled 2015-06-14 (×4): qty 1

## 2015-06-14 MED ORDER — INSULIN ASPART 100 UNIT/ML ~~LOC~~ SOLN: 0.0000 [IU] | Freq: Every day | SUBCUTANEOUS | Status: DC

## 2015-06-14 MED ORDER — DEXTROSE 5 % IV SOLN
2.0000 g | INTRAVENOUS | Status: DC
  Administered 2015-06-15 – 2015-06-17 (×3): 2 g via INTRAVENOUS
  Filled 2015-06-14 (×4): qty 2

## 2015-06-14 MED ORDER — VITAMIN C 500 MG PO TABS
500.0000 mg | ORAL_TABLET | Freq: Three times a day (TID) | ORAL | Status: DC
  Administered 2015-06-14 – 2015-06-18 (×12): 500 mg via ORAL
  Filled 2015-06-14 (×12): qty 1

## 2015-06-14 MED ORDER — DIATRIZOATE MEGLUMINE & SODIUM 66-10 % PO SOLN
15.0000 mL | Freq: Once | ORAL | Status: DC
Start: 2015-06-14 — End: 2015-06-18

## 2015-06-14 MED ORDER — HYDROCORTISONE 10 MG PO TABS
10.0000 mg | ORAL_TABLET | Freq: Two times a day (BID) | ORAL | Status: DC
  Administered 2015-06-14 – 2015-06-18 (×8): 10 mg via ORAL
  Filled 2015-06-14 (×8): qty 1

## 2015-06-14 MED ORDER — LEVOFLOXACIN IN D5W 750 MG/150ML IV SOLN
750.0000 mg | INTRAVENOUS | Status: DC
  Filled 2015-06-14: qty 150

## 2015-06-14 MED ORDER — IOPAMIDOL (ISOVUE-300) INJECTION 61%
100.0000 mL | Freq: Once | INTRAVENOUS | Status: AC | PRN
  Administered 2015-06-14: 100 mL via INTRAVENOUS

## 2015-06-14 MED ORDER — DIGOXIN 125 MCG PO TABS
0.1250 mg | ORAL_TABLET | Freq: Every morning | ORAL | Status: DC
  Administered 2015-06-15 – 2015-06-18 (×4): 0.125 mg via ORAL
  Filled 2015-06-14 (×4): qty 1

## 2015-06-14 MED ORDER — ONDANSETRON HCL 4 MG/2ML IJ SOLN: 4.0000 mg | Freq: Four times a day (QID) | INTRAMUSCULAR | Status: DC | PRN

## 2015-06-14 MED ORDER — VANCOMYCIN HCL IN DEXTROSE 1-5 GM/200ML-% IV SOLN
1000.0000 mg | Freq: Once | INTRAVENOUS | Status: AC
  Administered 2015-06-14: 1000 mg via INTRAVENOUS
  Filled 2015-06-14: qty 200

## 2015-06-14 MED ORDER — PYRIDOSTIGMINE BROMIDE 60 MG PO TABS
60.0000 mg | ORAL_TABLET | Freq: Two times a day (BID) | ORAL | Status: DC
  Administered 2015-06-14 – 2015-06-18 (×8): 60 mg via ORAL
  Filled 2015-06-14 (×9): qty 1

## 2015-06-14 MED ORDER — ONDANSETRON HCL 4 MG PO TABS: 4.0000 mg | ORAL_TABLET | Freq: Four times a day (QID) | ORAL | Status: DC | PRN

## 2015-06-14 MED ORDER — GABAPENTIN 300 MG PO CAPS
300.0000 mg | ORAL_CAPSULE | Freq: Three times a day (TID) | ORAL | Status: DC
  Administered 2015-06-14 – 2015-06-18 (×12): 300 mg via ORAL
  Filled 2015-06-14 (×12): qty 1

## 2015-06-14 MED ORDER — LEVOTHYROXINE SODIUM 50 MCG PO TABS
50.0000 ug | ORAL_TABLET | Freq: Every day | ORAL | Status: DC
  Administered 2015-06-15 – 2015-06-18 (×4): 50 ug via ORAL
  Filled 2015-06-14 (×4): qty 1

## 2015-06-14 MED ORDER — SODIUM CHLORIDE 0.9 % IV BOLUS (SEPSIS)
500.0000 mL | Freq: Once | INTRAVENOUS | Status: AC
  Administered 2015-06-14: 500 mL via INTRAVENOUS

## 2015-06-14 MED ORDER — ACETAMINOPHEN 325 MG PO TABS: 650.0000 mg | ORAL_TABLET | Freq: Four times a day (QID) | ORAL | Status: DC | PRN

## 2015-06-14 MED ORDER — SODIUM CHLORIDE 0.9 % IV SOLN
INTRAVENOUS | Status: DC
  Administered 2015-06-14 – 2015-06-15 (×2): via INTRAVENOUS

## 2015-06-14 MED ORDER — DIPHENHYDRAMINE HCL 50 MG/ML IJ SOLN
INTRAMUSCULAR | Status: AC
  Administered 2015-06-14: 25 mg via INTRAVENOUS
  Filled 2015-06-14: qty 1

## 2015-06-14 MED ORDER — ENOXAPARIN SODIUM 40 MG/0.4ML ~~LOC~~ SOLN
40.0000 mg | SUBCUTANEOUS | Status: DC
  Administered 2015-06-14: 40 mg via SUBCUTANEOUS
  Filled 2015-06-14 (×2): qty 0.4

## 2015-06-14 MED ORDER — ASPIRIN EC 81 MG PO TBEC
81.0000 mg | DELAYED_RELEASE_TABLET | Freq: Every day | ORAL | Status: DC
  Administered 2015-06-14 – 2015-06-18 (×5): 81 mg via ORAL
  Filled 2015-06-14 (×5): qty 1

## 2015-06-14 MED ORDER — RISAQUAD PO CAPS
1.0000 | ORAL_CAPSULE | Freq: Every day | ORAL | Status: DC
  Administered 2015-06-14 – 2015-06-18 (×5): 1 via ORAL
  Filled 2015-06-14 (×5): qty 1

## 2015-06-14 MED ORDER — PANTOPRAZOLE SODIUM 40 MG PO TBEC
40.0000 mg | DELAYED_RELEASE_TABLET | Freq: Every day | ORAL | Status: DC
  Administered 2015-06-15 – 2015-06-18 (×4): 40 mg via ORAL
  Filled 2015-06-14 (×4): qty 1

## 2015-06-14 MED ORDER — SODIUM CHLORIDE 0.9 % IV SOLN
Freq: Once | INTRAVENOUS | Status: AC
  Administered 2015-06-14: 09:00:00 via INTRAVENOUS

## 2015-06-14 MED ORDER — DIPHENHYDRAMINE HCL 50 MG/ML IJ SOLN
25.0000 mg | Freq: Once | INTRAMUSCULAR | Status: AC
  Administered 2015-06-14: 25 mg via INTRAVENOUS

## 2015-06-14 MED ORDER — ALBUTEROL SULFATE (2.5 MG/3ML) 0.083% IN NEBU: 2.5000 mg | INHALATION_SOLUTION | RESPIRATORY_TRACT | Status: DC | PRN

## 2015-06-14 MED ORDER — METHENAMINE HIPPURATE 1 G PO TABS
1.0000 g | ORAL_TABLET | Freq: Two times a day (BID) | ORAL | Status: DC
  Administered 2015-06-14 – 2015-06-16 (×5): 1 g via ORAL
  Filled 2015-06-14 (×8): qty 1

## 2015-06-14 MED ORDER — DEXTROSE 5 % IV SOLN
2.0000 g | Freq: Once | INTRAVENOUS | Status: AC
  Administered 2015-06-14: 21:00:00 2 g via INTRAVENOUS
  Filled 2015-06-14: qty 2

## 2015-06-14 MED ORDER — INSULIN ASPART 100 UNIT/ML ~~LOC~~ SOLN
0.0000 [IU] | Freq: Three times a day (TID) | SUBCUTANEOUS | Status: DC
  Filled 2015-06-14: qty 2

## 2015-06-14 MED ORDER — ACETAMINOPHEN 650 MG RE SUPP: 650.0000 mg | Freq: Four times a day (QID) | RECTAL | Status: DC | PRN

## 2015-06-14 MED ORDER — ONDANSETRON HCL 4 MG/2ML IJ SOLN
4.0000 mg | Freq: Once | INTRAMUSCULAR | Status: AC | PRN
  Administered 2015-06-14: 4 mg via INTRAVENOUS
  Filled 2015-06-14: qty 2

## 2015-06-14 MED ORDER — GUAIFENESIN-CODEINE 100-10 MG/5ML PO SOLN
5.0000 mL | Freq: Four times a day (QID) | ORAL | Status: DC | PRN
  Administered 2015-06-14 – 2015-06-15 (×2): 5 mL via ORAL
  Filled 2015-06-14 (×2): qty 5

## 2015-06-14 MED ORDER — CEFEPIME HCL 2 G IJ SOLR
2.0000 g | Freq: Once | INTRAMUSCULAR | Status: AC
  Administered 2015-06-14: 2 g via INTRAVENOUS
  Filled 2015-06-14: qty 2

## 2015-06-14 NOTE — ED Notes (Signed)
Pt repositioned in bed by tech and pt daughter.

## 2015-06-14 NOTE — H&P (Signed)
[facility] NAME: Russell Howard    MR#:  [id number]  DATE OF BIRTH:  1934-02-23  DATE OF ADMISSION:  06/14/2015  PRIMARY CARE PHYSICIAN: [name], MD   REQUESTING/REFERRING PHYSICIAN: Lisa Roca, MD  CHIEF COMPLAINT:   Chief Complaint  Patient presents with  . Nausea  . Emesis  . Diarrhea   Nausea, vomiting and diarrhea for 2 days. HISTORY OF PRESENT ILLNESS:  Russell Howard  is a 81 y.o. male with a known history ofChronic systolic CHF, lung cancer, and chronic A. fib. The patient to present to the ED visits nausea, vomiting, diarrhea and generalized weakness for the past 2 days. The patient also complains of fever yesterday. But he denies any melena or bloody stool. He was found low side blood pressure, leukocytosis and UTI, possible early sepsis. He was treated with IV fluid, vancomycin and Zosyn in the ED.  PAST MEDICAL HISTORY:   Past Medical History  Diagnosis Date  . CHF (congestive heart failure) ([place])   . Lung cancer ([place])   . Valvular heart disease   . Cardiomyopathy ([place])   . Acquired hypothyroidism 11/02/2014  . Chronic diastolic heart failure ([place]) 11/02/2014  . Diabetes mellitus without complication ([place]) 03/15/4707  . Cardiac defibrillator in place 11/02/2014  . Gastro-esophageal reflux disease without esophagitis 11/02/2014  . History of atrial fibrillation 11/02/2014  . H/O malignant neoplasm 11/09/2014  . Neuropathy ([place]) 11/02/2014  . Orthostasis 11/02/2014  . Pure hypercholesterolemia 11/02/2014  . Heart valve disease 11/02/2014  . BPH (benign prostatic hyperplasia)     PAST SURGICAL HISTORY:   Past Surgical History  Procedure Laterality Date  . Appendectomy    . Dual icd implant  2007/2009    implantable cardiac defibrillator  . Port-a-cath removal      SOCIAL HISTORY:   Social History  Substance Use Topics  . Smoking status: Former Smoker    Types: Cigarettes    Quit date:  11/21/1981  . Smokeless tobacco: Never Used  . Alcohol Use: No    FAMILY HISTORY:   Family History  Problem Relation Age of Onset  . Hypertension    . Heart disease      DRUG ALLERGIES:   Allergies  Allergen Reactions  . Penicillin G Hives  . Sulfa Antibiotics Rash    REVIEW OF SYSTEMS:  CONSTITUTIONAL: Has fever and chills, has generalized weakness.  EYES: No blurred or double vision.  EARS, NOSE, AND THROAT: No tinnitus or ear pain.  RESPIRATORY: No cough, shortness of breath, wheezing or hemoptysis.  CARDIOVASCULAR: No chest pain, orthopnea, edema.  GASTROINTESTINAL: Has nausea, vomiting, diarrhea and lower abdominal cramps. GENITOURINARY: No dysuria, hematuria.  ENDOCRINE: No polyuria, nocturia,  HEMATOLOGY: No anemia, easy bruising or bleeding SKIN: No rash or lesion. MUSCULOSKELETAL: No joint pain or arthritis.   NEUROLOGIC: No tingling, numbness, weakness.  PSYCHIATRY: No anxiety or depression.   MEDICATIONS AT HOME:   Prior to Admission medications   Medication Sig Start Date End Date Taking? Authorizing Provider  acidophilus (RISAQUAD) CAPS capsule Take 1 capsule by mouth daily.   Yes Historical Provider, MD  Ascorbic Acid (VITAMIN C) 1000 MG tablet Take 500 mg by mouth 3 (three) times daily. Reported on 06/14/2015   Yes Historical Provider, MD  aspirin EC 81 MG tablet Take 81 mg by mouth daily.    Yes Historical Provider, MD  Cholecalciferol 5000 units TABS Take 5,000 Units by mouth every morning.  Yes Historical Provider, MD  Coenzyme Q10 (TH CO Q-10) 100 MG capsule Take 100 mg by mouth daily.    Yes Historical Provider, MD  digoxin (LANOXIN) 0.125 MG tablet Take 0.125 mg by mouth every morning.  05/10/15  Yes Historical Provider, MD  furosemide (LASIX) 20 MG tablet Take 20 mg by mouth every other day as needed (as needed for water retention).  05/10/15  Yes Historical Provider, MD  gabapentin (NEURONTIN) 300 MG capsule Take 300 mg by mouth 3 (three) times daily.   12/20/14  Yes Historical Provider, MD  Glucosamine-Chondroit-Vit C-Mn (GLUCOSAMINE-CHONDROITIN) CAPS Take 1 capsule by mouth 2 (two) times daily.    Yes Historical Provider, MD  hydrocortisone (CORTEF) 10 MG tablet Take by mouth 2 (two) times daily.  04/10/15  Yes Historical Provider, MD  levothyroxine (SYNTHROID, LEVOTHROID) 50 MCG tablet Take 50 mcg by mouth daily before breakfast.   Yes Historical Provider, MD  Lutein 6 MG CAPS Take 1 capsule by mouth daily.    Yes Historical Provider, MD  methenamine (HIPREX) 1 g tablet Take 1 tablet (1 g total) by mouth 2 (two) times daily with a meal. 05/31/15  Yes Hollice Espy, MD  pantoprazole (PROTONIX) 40 MG tablet Take 40 mg by mouth daily before breakfast.    Yes Historical Provider, MD  potassium chloride (MICRO-K) 10 MEQ CR capsule Take 10 mEq by mouth every other day.    Yes Historical Provider, MD  pyridostigmine (MESTINON) 60 MG tablet Take 60 mg by mouth 2 (two) times daily.  05/07/15  Yes Historical Provider, MD  SAW PALMETTO-PUMPKIN SEED OIL PO Take 1 capsule by mouth daily.    Yes Historical Provider, MD  silodosin (RAPAFLO) 8 MG CAPS capsule Take 8 mg by mouth daily with breakfast.   Yes Historical Provider, MD  simvastatin (ZOCOR) 20 MG tablet Take 20 mg by mouth daily at 6 PM.    Yes Historical Provider, MD  vitamin B-12 (CYANOCOBALAMIN) 500 MCG tablet Take 500 mcg by mouth daily.    Yes Historical Provider, MD  vitamin E 400 UNIT capsule Take 400 Units by mouth daily.    Yes Historical Provider, MD  levothyroxine (SYNTHROID, LEVOTHROID) 50 MCG tablet Take 50 mcg by mouth daily before breakfast.  11/03/14 05/02/15  Historical Provider, MD      VITAL SIGNS:  Blood pressure 115/50, pulse 70, temperature 97.8 F (36.6 C), temperature source Oral, resp. rate 26, height '5\' 10"'$  (1.778 m), weight 73.029 kg (161 lb), SpO2 97 %.  PHYSICAL EXAMINATION:  GENERAL:  81 y.o.-year-old patient lying in the bed with no acute distress.  EYES: Pupils equal,  round, reactive to light and accommodation. No scleral icterus. Extraocular muscles intact.  HEENT: Head atraumatic, normocephalic. Oropharynx and nasopharynx clear. Moist oral mucosa. NECK:  Supple, no jugular venous distention. No thyroid enlargement, no tenderness.  LUNGS: Normal breath sounds bilaterally, no wheezing, rales,rhonchi or crepitation. No use of accessory muscles of respiration.  CARDIOVASCULAR: S1, S2 normal. No murmurs, rubs, or gallops.  ABDOMEN: Soft, nontender, nondistended. Bowel sounds present. No organomegaly or mass.  EXTREMITIES: No pedal edema, cyanosis, or clubbing.  NEUROLOGIC: Cranial nerves II through XII are intact. Muscle strength 4/5 in all extremities. Sensation intact. Gait not checked.  PSYCHIATRIC: The patient is alert and oriented x 3.  SKIN: No obvious rash, lesion, or ulcer.   LABORATORY PANEL:   CBC  Recent Labs Lab 06/14/15 0854  WBC 17.2*  HGB 13.1  HCT 39.7*  PLT 182   ------------------------------------------------------------------------------------------------------------------  Chemistries   Recent Labs Lab 06/14/15 0854  NA 135  K 4.2  CL 103  CO2 22  GLUCOSE 115*  BUN 32*  CREATININE 0.96  CALCIUM 9.3  AST 27  ALT 15*  ALKPHOS 75  BILITOT 1.5*   ------------------------------------------------------------------------------------------------------------------  Cardiac Enzymes No results for input(s): TROPONINI in the last 168 hours. ------------------------------------------------------------------------------------------------------------------  RADIOLOGY:  Ct Abdomen Pelvis W Contrast  06/14/2015  CLINICAL DATA:  Nausea and vomiting for 2 days with abdominal pain, history of lung carcinoma EXAM: CT ABDOMEN AND PELVIS WITH CONTRAST TECHNIQUE: Multidetector CT imaging of the abdomen and pelvis was performed using the standard protocol following bolus administration of intravenous contrast. CONTRAST:  133m  ISOVUE-300 IOPAMIDOL (ISOVUE-300) INJECTION 61% COMPARISON:  03/02/2015 FINDINGS: Lung bases demonstrate bilateral scarring without focal confluent infiltrate. A large chronic pleural effusion on the right is noted stable from the previous exam. Liver is homogeneous in attenuation without focal infiltrate. The gallbladder demonstrates a single dependent gallstone without complicating factors. The spleen, adrenal glands and pancreas are within normal limits. The kidneys are well visualized bilaterally. No renal calculi or obstructive changes are seen. A few tiny hypodensities are noted within the kidneys consistent with small cysts. Prostate is enlarged calcifications. The bladder is well distended without filling defect. The appendix has been surgically removed. Scattered diverticular change is noted without evidence of diverticulitis. Mild ectasia of the abdominal aorta is noted to 2.5 cm. Heavy calcifications are noted. A small hiatal hernia is noted. Degenerative changes of lumbar spine are seen. IMPRESSION: Cholelithiasis without complicating factors. Chronic right-sided pleural effusion. No acute abnormality noted. Electronically Signed   By: MInez CatalinaM.D.   On: 06/14/2015 12:40    EKG:   Orders placed or performed during the hospital encounter of 06/14/15  . EKG 12-Lead  . EKG 12-Lead    IMPRESSION AND PLAN:   Sepsis with UTI The patient will be admitted to medical floor. Start sepsis protocol, continue Rocephin, follow-up CBC and blood culture and urine culture.  Dehydration. Start IV normal saline 50 cc per hour and follow-up BMP.  Chronic systolic CHF with ejection fraction 20%. Stable. Hold lasix at this time. Chronic A. Fib. Rate controlled. Continue aspirin and digoxin. Hypertension. Hold lasix due to low side blood pressure. Diabetes. Start sliding scale.   All the records are reviewed and case discussed with ED provider. Management plans discussed with the patient, family  and they are in agreement.  CODE STATUS: DNR  TOTAL TIME TAKING CARE OF THIS PATIENT: 56 minutes.    CDemetrios LollM.D on 06/14/2015 at 4:50 PM  Between 7am to 6pm - Pager - [phone]  After 6pm go to [email or web address] - password [email or web address]  Office  [phone] CC: Primary care physician; SIdelle Crouch MD

## 2015-06-14 NOTE — Progress Notes (Signed)
Pharmacy Antibiotic Note  Russell Howard is a 81 y.o. male admitted on 06/14/2015 with sepsis and UTI.  Pharmacy has been consulted for Levaquin dosing. Patient has PCN allergy but apparently tolerated cefepime.   Plan:  After discussion with Dr. Bridgett Larsson, patient tolerated cefepime in ED so will change abx to ceftriaxone.  Patient received cefepime x 1 in ED. Will order ceftriaxone 2 g iv once at 2200 then q 24 hours from tomorrow at 1800.   Height: '5\' 10"'$  (177.8 cm) Weight: 161 lb (73.029 kg) IBW/kg (Calculated) : 73  Temp (24hrs), Avg:98.4 F (36.9 C), Min:97.8 F (36.6 C), Max:98.9 F (37.2 C)   Recent Labs Lab 06/14/15 0854 06/14/15 1120  WBC 17.2*  --   CREATININE 0.96  --   LATICACIDVEN  --  1.2    Estimated Creatinine Clearance: 65.5 mL/min (by C-G formula based on Cr of 0.96).    Allergies  Allergen Reactions  . Penicillin G Hives  . Sulfa Antibiotics Rash    Antimicrobials this admission: cefepime 5/11 >> x 1 vancomycin 5/11 >> x 1 Ceftriaxone 5/11 >>  Dose adjustments this admission:   Microbiology results: 5/11 BCx: pending   Thank you for allowing pharmacy to be a part of this patient's care.  Napoleon Form 06/14/2015 4:48 PM

## 2015-06-14 NOTE — ED Notes (Signed)
Resumed care from tricia rn now. Pt alert.  Pt waiting to be admitted  No family with pt.

## 2015-06-14 NOTE — ED Notes (Signed)
Pt to ed from home with reports of nausea, vomiting and diarrhea for two days. Also c/o abd pain in umbilical region that is constant 6/10. Ems reports all vss. Pt a/ox3 on arrival to ed.

## 2015-06-14 NOTE — ED Provider Notes (Signed)
[facility] Emergency Department Provider Note   ____________________________________________  Time seen: Approximately 9:15 AM I have reviewed the triage vital signs and the triage nursing note.  HISTORY  Chief Complaint Nausea; Emesis; and Diarrhea   Historian Patient, and daughter-in-law  HPI Russell Howard is a 81 y.o. male presents for nausea, vomiting, diarrhea, and fatigue. Symptoms started 2 days ago, and the last food he had was Tuesday morning. He did have a fever yesterday of which resolved today. No black or bloody stools. Numerous episodes of watery diarrhea. No bloody or bilious emesis. Mild to moderate diffuse abdominal cramping. History of diverticulitis in the remote past. He does have what sounds like urinary stricture that causes him to need to self catheter. He has a history of UTIs, but states this does not feel similar to prior UTIs.  No chest pain or coughing or trouble breathing.    Past Medical History  Diagnosis Date  . CHF (congestive heart failure) ([place])   . Lung cancer ([place])   . Valvular heart disease   . Cardiomyopathy ([place])   . Acquired hypothyroidism 11/02/2014  . Chronic diastolic heart failure ([place]) 11/02/2014  . Diabetes mellitus without complication ([place]) 1/61/0960  . Cardiac defibrillator in place 11/02/2014  . Gastro-esophageal reflux disease without esophagitis 11/02/2014  . History of atrial fibrillation 11/02/2014  . H/O malignant neoplasm 11/09/2014  . Neuropathy ([place]) 11/02/2014  . Orthostasis 11/02/2014  . Pure hypercholesterolemia 11/02/2014  . Heart valve disease 11/02/2014  . BPH (benign prostatic hyperplasia)     Patient Active Problem List   Diagnosis Date Noted  . H/O malignant neoplasm 11/09/2014  . Acquired hypothyroidism 11/02/2014  . Cardiomyopathy ([place]) 11/02/2014  . Chronic diastolic heart failure ([place]) 11/02/2014  . Diabetes mellitus without complication ([place]) 45/40/9811  . Cardiac defibrillator in  place 11/02/2014  . Gastro-esophageal reflux disease without esophagitis 11/02/2014  . History of atrial fibrillation 11/02/2014  . BP (high blood pressure) 11/02/2014  . Neuropathy ([place]) 11/02/2014  . Orthostasis 11/02/2014  . Pure hypercholesterolemia 11/02/2014  . Heart valve disease 11/02/2014    Past Surgical History  Procedure Laterality Date  . Appendectomy    . Dual icd implant  2007/2009    implantable cardiac defibrillator  . Port-a-cath removal      Current Outpatient Rx  Name  Route  Sig  Dispense  Refill  . acidophilus (RISAQUAD) CAPS capsule   Oral   Take 1 capsule by mouth daily.         . Ascorbic Acid (VITAMIN C) 1000 MG tablet   Oral   Take 500 mg by mouth 3 (three) times daily. Reported on 06/14/2015         . aspirin EC 81 MG tablet   Oral   Take 81 mg by mouth daily.          . Cholecalciferol 5000 units TABS   Oral   Take 5,000 Units by mouth every morning.         . Coenzyme Q10 (TH CO Q-10) 100 MG capsule   Oral   Take 100 mg by mouth daily.          . digoxin (LANOXIN) 0.125 MG tablet   Oral   Take 0.125 mg by mouth every morning.          . furosemide (LASIX) 20 MG tablet   Oral   Take 20 mg by mouth every other day as needed (as needed for water retention).          Marland Kitchen  gabapentin (NEURONTIN) 300 MG capsule   Oral   Take 300 mg by mouth 3 (three) times daily.          . Glucosamine-Chondroit-Vit C-Mn (GLUCOSAMINE-CHONDROITIN) CAPS   Oral   Take 1 capsule by mouth 2 (two) times daily.          . hydrocortisone (CORTEF) 10 MG tablet   Oral   Take by mouth 2 (two) times daily.          Marland Kitchen levothyroxine (SYNTHROID, LEVOTHROID) 50 MCG tablet   Oral   Take 50 mcg by mouth daily before breakfast.         . Lutein 6 MG CAPS   Oral   Take 1 capsule by mouth daily.          . methenamine (HIPREX) 1 g tablet   Oral   Take 1 tablet (1 g total) by mouth 2 (two) times daily with a meal.   60 tablet   3   .  pantoprazole (PROTONIX) 40 MG tablet   Oral   Take 40 mg by mouth daily before breakfast.          . potassium chloride (MICRO-K) 10 MEQ CR capsule   Oral   Take 10 mEq by mouth every other day.          . pyridostigmine (MESTINON) 60 MG tablet   Oral   Take 60 mg by mouth 2 (two) times daily.          . SAW PALMETTO-PUMPKIN SEED OIL PO   Oral   Take 1 capsule by mouth daily.          . silodosin (RAPAFLO) 8 MG CAPS capsule   Oral   Take 8 mg by mouth daily with breakfast.         . simvastatin (ZOCOR) 20 MG tablet   Oral   Take 20 mg by mouth daily at 6 PM.          . vitamin B-12 (CYANOCOBALAMIN) 500 MCG tablet   Oral   Take 500 mcg by mouth daily.          . vitamin E 400 UNIT capsule   Oral   Take 400 Units by mouth daily.          Marland Kitchen EXPIRED: levothyroxine (SYNTHROID, LEVOTHROID) 50 MCG tablet   Oral   Take 50 mcg by mouth daily before breakfast.            Allergies Penicillin g and Sulfa antibiotics  Family History  Problem Relation Age of Onset  . Hypertension    . Heart disease      Social History Social History  Substance Use Topics  . Smoking status: Former Smoker    Types: Cigarettes    Quit date: 11/21/1981  . Smokeless tobacco: Never Used  . Alcohol Use: No    Review of Systems  Constitutional: Negative for fever. Eyes: Negative for visual changes. ENT: Negative for sore throat. Cardiovascular: Negative for chest pain. Respiratory: Negative for shortness of breath. Gastrointestinal: As per history of present illness Genitourinary: Negative for dysuria, but chronically catheterizes for urine twice daily. Musculoskeletal: Negative for back pain. Skin: Negative for rash. Neurological: Negative for headache. 10 point Review of Systems otherwise negative ____________________________________________   PHYSICAL EXAM:  VITAL SIGNS: ED Triage Vitals  Enc Vitals Group     BP 06/14/15 0851 117/63 mmHg     Pulse Rate  06/14/15 0851 70  Resp 06/14/15 0851 32     Temp 06/14/15 0851 98.9 F (37.2 C)     Temp Source 06/14/15 0851 Oral     SpO2 06/14/15 0851 97 %     Weight 06/14/15 0851 161 lb (73.029 kg)     Height 06/14/15 0851 '5\' 10"'$  (1.778 m)     Head Cir --      Peak Flow --      Pain Score 06/14/15 0852 6     Pain Loc --      Pain Edu? --      Excl. in [place]? --      Constitutional: Alert and oriented. Well appearing and in no distress. HEENT   Head: Normocephalic and atraumatic.      Eyes: Conjunctivae are normal. PERRL. Normal extraocular movements.      Ears:         Nose: No congestion/rhinnorhea.   Mouth/Throat: Mucous membranes are Moderately dry.   Neck: No stridor. Cardiovascular/Chest: Normal rate, regular rhythm.  No murmurs, rubs, or gallops. Respiratory: Normal respiratory effort without tachypnea nor retractions. Breath sounds are clear and equal bilaterally. No wheezes/rales/rhonchi. Gastrointestinal: Soft. No distention, no guarding, no rebound. Mild diffuse discomfort across the abdomen.  Genitourinary/rectal:Deferred Musculoskeletal: Nontender with normal range of motion in all extremities. No joint effusions.  No lower extremity tenderness.  No edema. Neurologic:  Normal speech and language. No gross or focal neurologic deficits are appreciated. Skin:  Skin is warm, dry and intact. No rash noted. Psychiatric: Mood and affect are normal. Speech and behavior are normal. Patient exhibits appropriate insight and judgment.  ____________________________________________   EKG I, Lisa Roca, MD, the attending physician have personally viewed and interpreted all ECGs.  70 bpm. Ventricularly paced. ____________________________________________  LABS (pertinent positives/negatives)  White blood count 17.2, hemoglobin 13.1 and platelet count 118 Comprehensive metabolic panel significant for BUN 32 otherwise without significant abnormality Urinalysis 6-30 red blood  cells, too numerous to count white blood cells and 3+ leukocytes with negative bacteria and nitrites Lactate 1.2  ____________________________________________  RADIOLOGY All Xrays were viewed by me. Imaging interpreted by Radiologist.  CT abdomen pelvis with contrast:  IMPRESSION: Cholelithiasis without complicating factors.  Chronic right-sided pleural effusion.  No acute abnormality noted.  Chest one view portable: Pending __________________________________________  PROCEDURES  Procedure(s) performed: None  Critical Care performed: CRITICAL CARE Performed by: Lisa Roca   Total critical care time: 30 minutes  Critical care time was exclusive of separately billable procedures and treating other patients.  Critical care was necessary to treat or prevent imminent or life-threatening deterioration.  Critical care was time spent personally by me on the following activities: development of treatment plan with patient and/or surrogate as well as nursing, discussions with consultants, evaluation of patient's response to treatment, examination of patient, obtaining history from patient or surrogate, ordering and performing treatments and interventions, ordering and review of laboratory studies, ordering and review of radiographic studies, pulse oximetry and re-evaluation of patient's condition.   ____________________________________________   ED COURSE / ASSESSMENT AND PLAN  Pertinent labs & imaging results that were available during my care of the patient were reviewed by me and considered in my medical decision making (see chart for details).   Although his symptoms seem like they could be due to gastroenteritis which is common and the continued right now, with his abdominal discomfort, history of fever, and feeling of fatigue, with elevated white blood cell count I am going to CT his abdomen. He is  given IV fluid bolus as well as symptomatic nausea medication.   Patient  continued to feel very poorly. When he returned from CT scan he was having a lot of coughing. The CT tech was questioning whether or not this could've been a reaction to the CT dye I did give the patient does appear Benadryl, however on additional questioning by the patient and his daughter they think that this is similar to his chronic coughing, and he does not feel that this was initiated by the CT scan or the dye injection. He does not have any wheezing. He does not have any hives.  He's had poor light blood pressures systolic in the 48N after 1 L fluid. He has this chronic pleural effusion, and sign a little hesitant to over hydrate. I am however concerned for possible early sepsis and I did initiate the code sepsis protocol. I am not however going to give protocol 30 cc/kg out of concern for volume overload/CHF exacerbation.  I'm unclear what is source I be, whether or not it could in fact be viral gastroenteritis, versus cough with pleural effusion being superinfected, versus some urinary symptoms with a history of UTIs, I am a cover with vancomycin and Zosyn because he still feels extremely poorly.     CONSULTATIONS:   Dr. Margaretmary Eddy, hospitalist for admission.   Patient / Family / Caregiver informed of clinical course, medical decision-making process, and agree with plan.    ___________________________________________   FINAL CLINICAL IMPRESSION(S) / ED DIAGNOSES   Final diagnoses:  Diarrhea, unspecified type  Hypotension, unspecified hypotension type  Sepsis, due to unspecified organism [facility])              Note: This dictation was prepared with Dragon dictation. Any transcriptional errors that result from this process are unintentional   Lisa Roca, MD 06/14/15 1428

## 2015-06-15 ENCOUNTER — Encounter: Payer: Self-pay | Admitting: Radiology

## 2015-06-15 ENCOUNTER — Inpatient Hospital Stay: Payer: Medicare Other

## 2015-06-15 LAB — GLUCOSE, CAPILLARY
GLUCOSE-CAPILLARY: 123 mg/dL — AB (ref 65–99)
Glucose-Capillary: 68 mg/dL (ref 65–99)
Glucose-Capillary: 120 mg/dL — ABNORMAL HIGH (ref 65–99)
Glucose-Capillary: 124 mg/dL — ABNORMAL HIGH (ref 65–99)

## 2015-06-15 LAB — URINE CULTURE

## 2015-06-15 LAB — CBC
HCT: 36.6 % — ABNORMAL LOW (ref 40.0–52.0)
Hemoglobin: 12.2 g/dL — ABNORMAL LOW (ref 13.0–18.0)
MCH: 29.4 pg (ref 26.0–34.0)
MCHC: 33.4 g/dL (ref 32.0–36.0)
MCV: 88 fL (ref 80.0–100.0)
PLATELETS: 150 10*3/uL (ref 150–440)
RBC: 4.16 MIL/uL — ABNORMAL LOW (ref 4.40–5.90)
RDW: 17.1 % — AB (ref 11.5–14.5)
WBC: 10.3 10*3/uL (ref 3.8–10.6)

## 2015-06-15 LAB — BASIC METABOLIC PANEL
Anion gap: 6 (ref 5–15)
BUN: 27 mg/dL — AB (ref 6–20)
CHLORIDE: 105 mmol/L (ref 101–111)
CO2: 25 mmol/L (ref 22–32)
Calcium: 8.9 mg/dL (ref 8.9–10.3)
Creatinine, Ser: 0.81 mg/dL (ref 0.61–1.24)
GFR calc Af Amer: >60 mL/min (ref >60)
GFR calc non Af Amer: >60 mL/min (ref >60)
GLUCOSE: 78 mg/dL (ref 65–99)
Potassium: 3.8 mmol/L (ref 3.5–5.1)
SODIUM: 136 mmol/L (ref 135–145)

## 2015-06-15 LAB — MRSA PCR SCREENING: MRSA by PCR: POSITIVE — AB

## 2015-06-15 MED ORDER — IOPAMIDOL (ISOVUE-300) INJECTION 61%
75.0000 mL | Freq: Once | INTRAVENOUS | Status: AC | PRN
  Administered 2015-06-15: 75 mL via INTRAVENOUS

## 2015-06-15 MED ORDER — CHLORHEXIDINE GLUCONATE CLOTH 2 % EX PADS
6.0000 | MEDICATED_PAD | Freq: Every day | CUTANEOUS | Status: DC
  Administered 2015-06-16 – 2015-06-18 (×3): 6 via TOPICAL

## 2015-06-15 MED ORDER — MUPIROCIN 2 % EX OINT
1.0000 "application " | TOPICAL_OINTMENT | Freq: Two times a day (BID) | CUTANEOUS | Status: DC
  Administered 2015-06-15 – 2015-06-18 (×6): 1 via NASAL
  Filled 2015-06-15: qty 22

## 2015-06-15 MED ORDER — ENOXAPARIN SODIUM 120 MG/0.8ML ~~LOC~~ SOLN
1.5000 mg/kg | SUBCUTANEOUS | Status: DC
  Administered 2015-06-15 – 2015-06-16 (×2): 110 mg via SUBCUTANEOUS
  Filled 2015-06-15 (×3): qty 0.8

## 2015-06-15 NOTE — Progress Notes (Signed)
Urine culture of 4/14 was previously reported as MSSA, but was actually MRSA.  Pt needs MRSA pcr screen this stay based on history of MRSA as well as to be on contact precautions.  If MRSA positive, please implement MRSA positive order set.  If MRSA negative, patient should remain on contact precautions for this stay due to the timing of recent MRSA in urine.  If decolonization is indicated/done, it could help reduce likelihood of need for contact precautions in future hospitalizations.  Hubert Azure, MSN, RN, CIC  06/15/15 10:43AM

## 2015-06-15 NOTE — Progress Notes (Signed)
[place] at [facility]                                                                                                                                                                                            Patient Demographics   Russell Howard, is a 81 y.o. male, DOB - October 31, 1934, [id number]  Admit date - 06/14/2015   Admitting Physician Demetrios Loll, MD  Outpatient Primary MD for the patient is SPARKS,JEFFREY D, MD   LOS - 1  Subjective: pt feels very weak, c/o cough, His diarrhea has resolved. No further nausea or vomiting.     Review of Systems:   CONSTITUTIONAL: No documented fever. Positive fatigue, and weakness. No weight gain, no weight loss.  EYES: No blurry or double vision.  ENT: No tinnitus. No postnasal drip. No redness of the oropharynx.  RESPIRATORY: Positive cough, no wheeze, no hemoptysis. No dyspnea.  CARDIOVASCULAR: No chest pain. No orthopnea. No palpitations. No syncope.  GASTROINTESTINAL: Resolved nausea, no vomiting or diarrhea. No abdominal pain. No melena or hematochezia.  GENITOURINARY: Chronic urinary difficulties does in and out catheter  ENDOCRINE: No polyuria or nocturia. No heat or cold intolerance.  HEMATOLOGY: No anemia. No bruising. No bleeding.  INTEGUMENTARY: No rashes. No lesions.  MUSCULOSKELETAL: No arthritis. No swelling. No gout.  NEUROLOGIC: No numbness, tingling, or ataxia. No seizure-type activity.  PSYCHIATRIC: No anxiety. No insomnia. No ADD.    Vitals:   Filed Vitals:   06/14/15 1640 06/14/15 2055 06/15/15 0440 06/15/15 1042  BP: 115/50 104/60 100/52   Pulse: 70 71 70 70  Temp: 97.8 F (36.6 C) 98 F (36.7 C) 97.7 F (36.5 C)   TempSrc: Oral Oral Oral   Resp: '21 20 18   '$ Height: '5\' 10"'$  (1.778 m)     Weight: 72.122 kg (159 lb)     SpO2: 97% 98% 96%     Wt Readings from Last 3 Encounters:  06/14/15 72.122 kg (159 lb)  05/18/15 69.854 kg (154 lb)  05/11/15 69.854 kg (154 lb)      Intake/Output Summary (Last 24 hours) at 06/15/15 1106 Last data filed at 06/15/15 0250  Gross per 24 hour  Intake    250 ml  Output    850 ml  Net   -600 ml    Physical Exam:   GENERAL: Pleasant-appearing in no apparent distress.  HEAD, EYES, EARS, NOSE AND THROAT: Atraumatic, normocephalic. Extraocular muscles are intact. Pupils equal and reactive to light. Sclerae anicteric. No conjunctival injection. No oro-pharyngeal erythema.  NECK: Supple. There is no jugular venous distention. No bruits, no lymphadenopathy,  no thyromegaly.  HEART: Regular rate and rhythm,. No murmurs, no rubs, no clicks.  LUNGS:  Diminished breath sounds at the bases bilaterally no sensory muscle usage  ABDOMEN: Soft, flat, nontender, nondistended. Has good bowel sounds. No hepatosplenomegaly appreciated.  EXTREMITIES: No evidence of any cyanosis, clubbing, or peripheral edema.  +2 pedal and radial pulses bilaterally.  NEUROLOGIC: The patient is alert, awake, and oriented x3 with no focal motor or sensory deficits appreciated bilaterally.  SKIN: Moist and warm with no rashes appreciated.  Psych: Not anxious, depressed LN: No inguinal LN enlargement    Antibiotics   Anti-infectives    Start     Dose/Rate Route Frequency Ordered Stop   06/15/15 1800  cefTRIAXone (ROCEPHIN) 2 g in dextrose 5 % 50 mL IVPB     2 g 100 mL/hr over 30 Minutes Intravenous Every 24 hours 06/14/15 1656     06/14/15 2200  cefTRIAXone (ROCEPHIN) 2 g in dextrose 5 % 50 mL IVPB     2 g 100 mL/hr over 30 Minutes Intravenous  Once 06/14/15 1656 06/14/15 2149   06/14/15 1800  levofloxacin (LEVAQUIN) IVPB 750 mg  Status:  Discontinued     750 mg 100 mL/hr over 90 Minutes Intravenous Every 24 hours 06/14/15 1647 06/14/15 1654   06/14/15 1415  vancomycin (VANCOCIN) IVPB 1000 mg/200 mL premix     1,000 mg 200 mL/hr over 60 Minutes Intravenous  Once 06/14/15 1405 06/14/15 1636   06/14/15 1415  ceFEPIme (MAXIPIME) 2 g in dextrose 5 %  50 mL IVPB    Comments:  Concern for sepsis   2 g 100 mL/hr over 30 Minutes Intravenous  Once 06/14/15 1405 06/14/15 1612      Medications   Scheduled Meds: . acidophilus  1 capsule Oral Daily  . aspirin EC  81 mg Oral Daily  . cefTRIAXone (ROCEPHIN)  IV  2 g Intravenous Q24H  . diatrizoate meglumine-sodium  15 mL Oral Once  . digoxin  0.125 mg Oral q morning - 10a  . enoxaparin (LOVENOX) injection  40 mg Subcutaneous Q24H  . gabapentin  300 mg Oral TID  . hydrocortisone  10 mg Oral BID  . insulin aspart  0-5 Units Subcutaneous QHS  . insulin aspart  0-9 Units Subcutaneous TID WC  . levothyroxine  50 mcg Oral QAC breakfast  . methenamine  1 g Oral BID WC  . pantoprazole  40 mg Oral QAC breakfast  . pyridostigmine  60 mg Oral BID  . simvastatin  20 mg Oral q1800  . vitamin C  500 mg Oral TID   Continuous Infusions: . sodium chloride 50 mL/hr at 06/14/15 1650   PRN Meds:.acetaminophen **OR** acetaminophen, albuterol, guaiFENesin-codeine, ondansetron **OR** ondansetron (ZOFRAN) IV   Data Review:   Micro Results No results found for this or any previous visit (from the past 240 hour(s)).  Radiology Reports Dg Chest 2 View  05/18/2015  CLINICAL DATA:  Body ache, nausea and fever for 1 day. Chronic cough. History of lung cancer and atrial fibrillation. EXAM: CHEST  2 VIEW COMPARISON:  Chest radiograph May 11, 2015 and PET-CT March 02, 2015 FINDINGS: Stable chronic changes RIGHT lung with volume loss, pleural thickening, prominent interstitial densities and central consolidation. Hyperaerated LEFT lung with chronic interstitial changes. LEFT cardiac defibrillator in situ. Status post cardiac valve replacement. No pneumothorax. Osteopenia. Moderate degenerative change of the thoracic spine with broad dextroscoliosis. IMPRESSION: Stable chronic changes RIGHT lung with volume loss, pleural thickening, interstitial prominence and underlying  consolidation. Electronically Signed   By:  Elon Alas M.D.   On: 05/18/2015 22:54   Dg Abd 1 View  05/31/2015  CLINICAL DATA:  Recurrent UTIs EXAM: ABDOMEN - 1 VIEW COMPARISON:  None. FINDINGS: Scattered large and small bowel gas is noted. Scoliosis concave to the right is noted in the lumbar spine. No compression deformities are seen. No acute bony abnormality is noted. Diffuse vascular calcifications are seen IMPRESSION: No acute abnormality noted. Electronically Signed   By: Inez Catalina M.D.   On: 05/31/2015 14:35   Ct Abdomen Pelvis W Contrast  06/14/2015  CLINICAL DATA:  Nausea and vomiting for 2 days with abdominal pain, history of lung carcinoma EXAM: CT ABDOMEN AND PELVIS WITH CONTRAST TECHNIQUE: Multidetector CT imaging of the abdomen and pelvis was performed using the standard protocol following bolus administration of intravenous contrast. CONTRAST:  172m ISOVUE-300 IOPAMIDOL (ISOVUE-300) INJECTION 61% COMPARISON:  03/02/2015 FINDINGS: Lung bases demonstrate bilateral scarring without focal confluent infiltrate. A large chronic pleural effusion on the right is noted stable from the previous exam. Liver is homogeneous in attenuation without focal infiltrate. The gallbladder demonstrates a single dependent gallstone without complicating factors. The spleen, adrenal glands and pancreas are within normal limits. The kidneys are well visualized bilaterally. No renal calculi or obstructive changes are seen. A few tiny hypodensities are noted within the kidneys consistent with small cysts. Prostate is enlarged calcifications. The bladder is well distended without filling defect. The appendix has been surgically removed. Scattered diverticular change is noted without evidence of diverticulitis. Mild ectasia of the abdominal aorta is noted to 2.5 cm. Heavy calcifications are noted. A small hiatal hernia is noted. Degenerative changes of lumbar spine are seen. IMPRESSION: Cholelithiasis without complicating factors. Chronic right-sided  pleural effusion. No acute abnormality noted. Electronically Signed   By: MInez CatalinaM.D.   On: 06/14/2015 12:40   Dg Chest Port 1 View  06/14/2015  CLINICAL DATA:  Generalized weakness for 2 days. Fever for 1 day. History of lung carcinoma EXAM: PORTABLE CHEST 1 VIEW COMPARISON:  May 11, 2015 FINDINGS: There is extensive volume loss on the right. There are areas of loculated effusion as well as reticulonodular interstitial disease throughout the right lung. On the left, there is fine reticular interstitial disease diffusely. There is no volume loss left. The heart size is within normal limits. Pacemaker leads are attached to the right atrium, right ventricle, and left ventricle. No pneumothorax. IMPRESSION: Diffuse interstitial disease with superimposed effusion and atelectasis on the right with extensive volume loss the right. Superimposed pneumonia on the right cannot be entirely excluded in this circumstance. The left lung is mildly hyperexpanded with fine reticular interstitial disease diffusely throughout the left lung. Stable cardiac silhouette. Electronically Signed   By: WLowella GripIII M.D.   On: 06/14/2015 17:15     CBC  Recent Labs Lab 06/14/15 0854 06/15/15 0401  WBC 17.2* 10.3  HGB 13.1 12.2*  HCT 39.7* 36.6*  PLT 182 150  MCV 87.4 88.0  MCH 28.8 29.4  MCHC 32.9 33.4  RDW 17.3* 17.1*    Chemistries   Recent Labs Lab 06/14/15 0854 06/14/15 1659 06/15/15 0401  NA 135  --  136  K 4.2  --  3.8  CL 103  --  105  CO2 22  --  25  GLUCOSE 115*  --  78  BUN 32*  --  27*  CREATININE 0.96  --  0.81  CALCIUM 9.3  --  8.9  MG  --  1.7  --   AST 27  --   --   ALT 15*  --   --   ALKPHOS 75  --   --   BILITOT 1.5*  --   --    ------------------------------------------------------------------------------------------------------------------ estimated creatinine clearance is 76.6 mL/min (by C-G formula based on Cr of  0.81). ------------------------------------------------------------------------------------------------------------------ No results for input(s): HGBA1C in the last 72 hours. ------------------------------------------------------------------------------------------------------------------ No results for input(s): CHOL, HDL, LDLCALC, TRIG, CHOLHDL, LDLDIRECT in the last 72 hours. ------------------------------------------------------------------------------------------------------------------ No results for input(s): TSH, T4TOTAL, T3FREE, THYROIDAB in the last 72 hours.  Invalid input(s): FREET3 ------------------------------------------------------------------------------------------------------------------ No results for input(s): VITAMINB12, FOLATE, FERRITIN, TIBC, IRON, RETICCTPCT in the last 72 hours.  Coagulation profile No results for input(s): INR, PROTIME in the last 168 hours.  No results for input(s): DDIMER in the last 72 hours.  Cardiac Enzymes No results for input(s): CKMB, TROPONINI, MYOGLOBIN in the last 168 hours.  Invalid input(s): CK ------------------------------------------------------------------------------------------------------------------ Invalid input(s): [place]  Patient is a 81 year old white male admitted with sepsis  1. Sepsis with UTI  Urine cultures currently pending Continue ceftriaxone  2.Dehydration. Continue IV fluids for now  3. Nausea vomiting diarrhea now resolved. C. difficile precautions  4. Abnormal chest x-ray will obtain a CT scan of the lungs  5.Chronic systolic CHF with ejection fraction 20%. Stable. Lasix on hold monitor respiratory status currently CHF compensated.  6. Chronic A. Fib. Rate controlled. Continue aspirin and digoxin.  7. Hypertension. Lasix on hold due to low blood pressure blood pressure improved but still little low.  8. Diabetes type 2 continue sliding scale insulin blood sugars  low  9. Miscellaneous Lovenox for DVT prophylaxis      Code Status Orders        Start     Ordered   06/14/15 1640  Do not attempt resuscitation (DNR)   Continuous    Question Answer Comment  In the event of cardiac or respiratory ARREST Do not call a "code blue"   In the event of cardiac or respiratory ARREST Do not perform Intubation, CPR, defibrillation or ACLS   In the event of cardiac or respiratory ARREST Use medication by any route, position, wound care, and other measures to relive pain and suffering. May use oxygen, suction and manual treatment of airway obstruction as needed for comfort.      06/14/15 1639    Code Status History    Date Active Date Inactive Code Status Order ID Comments User Context   This patient has a current code status but no historical code status.    Advance Directive Documentation        Most Recent Value   Type of Advance Directive  Living will   Pre-existing out of facility DNR order (yellow form or pink MOST form)     "MOST" Form in Place?             Consults  None DVT Prophylaxis  Lovenox Lab Results  Component Value Date   PLT 150 06/15/2015     Time Spent in minutes   28mn  Greater than 50% of time spent in care coordination and counseling patient regarding the condition and plan of care.   PDustin FlockM.D on 06/15/2015 at 11:06 AM  Between 7am to 6pm - Pager - [phone]  After 6pm go to [email or web address] - password [email or web address]   Office  [phone]

## 2015-06-15 NOTE — Progress Notes (Signed)
Called by radiology patient with right main pulm a. Emboli, I notified pt daughter and patient, he will be started on full dose lovenox

## 2015-06-15 NOTE — Progress Notes (Signed)
Pharmacy Antibiotic Note  Russell Howard is a 81 y.o. male admitted on 06/14/2015 with sepsis and UTI.  Pharmacy has been consulted for Levaquin dosing. Patient has PCN allergy but apparently tolerated cefepime.   Plan:  Continue Rocephin 1 g IV q24 hours.   Height: '5\' 10"'$  (177.8 cm) Weight: 159 lb (72.122 kg) IBW/kg (Calculated) : 73  Temp (24hrs), Avg:97.8 F (36.6 C), Min:97.7 F (36.5 C), Max:98 F (36.7 C)   Recent Labs Lab 06/14/15 0854 06/14/15 1120 06/15/15 0401  WBC 17.2*  --  10.3  CREATININE 0.96  --  0.81  LATICACIDVEN  --  1.2  --     Estimated Creatinine Clearance: 76.6 mL/min (by C-G formula based on Cr of 0.81).    Allergies  Allergen Reactions  . Penicillin G Hives  . Sulfa Antibiotics Rash    Antimicrobials this admission: cefepime 5/11 >> x 1 vancomycin 5/11 >> x 1 Ceftriaxone 5/11 >>  Dose adjustments this admission:   Microbiology results: 5/11 BCx: pending   Thank you for allowing pharmacy to be a part of this patient's care.  Levina Boyack D 06/15/2015 9:57 AM

## 2015-06-16 ENCOUNTER — Inpatient Hospital Stay: Payer: Medicare Other

## 2015-06-16 LAB — C DIFFICILE QUICK SCREEN W PCR REFLEX
C DIFFICLE (CDIFF) ANTIGEN: NEGATIVE
C Diff interpretation: NEGATIVE
C Diff toxin: NEGATIVE

## 2015-06-16 LAB — BASIC METABOLIC PANEL
Anion gap: 6 (ref 5–15)
BUN: 21 mg/dL — AB (ref 6–20)
CALCIUM: 8.7 mg/dL — AB (ref 8.9–10.3)
CHLORIDE: 106 mmol/L (ref 101–111)
CO2: 26 mmol/L (ref 22–32)
CREATININE: 0.79 mg/dL (ref 0.61–1.24)
Glucose, Bld: 112 mg/dL — ABNORMAL HIGH (ref 65–99)
Potassium: 4.1 mmol/L (ref 3.5–5.1)
Sodium: 138 mmol/L (ref 135–145)

## 2015-06-16 LAB — GLUCOSE, CAPILLARY
GLUCOSE-CAPILLARY: 115 mg/dL — AB (ref 65–99)
GLUCOSE-CAPILLARY: 96 mg/dL (ref 65–99)
Glucose-Capillary: 101 mg/dL — ABNORMAL HIGH (ref 65–99)
Glucose-Capillary: 120 mg/dL — ABNORMAL HIGH (ref 65–99)

## 2015-06-16 LAB — CBC
HCT: 35.6 % — ABNORMAL LOW (ref 40.0–52.0)
Hemoglobin: 11.6 g/dL — ABNORMAL LOW (ref 13.0–18.0)
MCH: 29.6 pg (ref 26.0–34.0)
MCHC: 32.7 g/dL (ref 32.0–36.0)
MCV: 90.6 fL (ref 80.0–100.0)
PLATELETS: 171 10*3/uL (ref 150–440)
RBC: 3.93 MIL/uL — AB (ref 4.40–5.90)
RDW: 17.2 % — AB (ref 11.5–14.5)
WBC: 9.2 10*3/uL (ref 3.8–10.6)

## 2015-06-16 MED ORDER — LEVOTHYROXINE SODIUM 50 MCG PO TABS: 50.0000 ug | ORAL_TABLET | Freq: Every day | ORAL | Status: DC

## 2015-06-16 MED ORDER — FUROSEMIDE 20 MG PO TABS: 20.0000 mg | ORAL_TABLET | ORAL | Status: DC | PRN

## 2015-06-16 NOTE — Progress Notes (Signed)
Patient has been alert and oriented. No complaints of pain. IV antibiotics continued. Urine culture sent today, pending results. Patient has off and on had diarrhea with liquid to runny stools. Cdiff was never tested. Dr. Vianne Bulls notified after patient had two today, so cdiff sample was sent. Test came back negative. Patient still on contact for MRSA in the nares.

## 2015-06-16 NOTE — Progress Notes (Signed)
[place] [facility]                                                                                                                                                                                            Patient Demographics   Russell Howard, is a 81 y.o. male, DOB - 01-Oct-1934, ACZ:[id number]  Admit date - 06/14/2015   Admitting Physician Demetrios Loll, MD  Outpatient Primary MD for the patient is SPARKS,JEFFREY D, MD   LOS - 2  Subjective: pt feels very weak, No cough, no shortness of breath. No diarrhea. Tolerating the diet.   Review of systems ; EYES: No blurry or double vision.  ENT: No tinnitus. No postnasal drip. No redness of the oropharynx.  RESPIRATORY: Positive cough, no wheeze, no hemoptysis. No dyspnea.  CARDIOVASCULAR: No chest pain. No orthopnea. No palpitations. No syncope.  GASTROINTESTINAL: Resolved nausea, no vomiting or diarrhea. No abdominal pain. No melena or hematochezia.  GENITOURINARY: Chronic urinary difficulties does in and out catheter  ENDOCRINE: No polyuria or nocturia. No heat or cold intolerance.  HEMATOLOGY: No anemia. No bruising. No bleeding.  INTEGUMENTARY: No rashes. No lesions.  MUSCULOSKELETAL: No arthritis. No swelling. No gout.  NEUROLOGIC: No numbness, tingling, or ataxia. No seizure-type activity.  PSYCHIATRIC: No anxiety. No insomnia. No ADD.    Vitals:   Filed Vitals:   06/15/15 1042 06/15/15 1343 06/15/15 2134 06/16/15 0523  BP:  103/46 115/53 101/37  Pulse: 70 69 70 70  Temp:  98.5 F (36.9 C) 98.7 F (37.1 C) 97.9 F (36.6 C)  TempSrc:  Oral Oral Oral  Resp:   20 20  Height:      Weight:      SpO2:  98% 93% 99%    Wt Readings from Last 3 Encounters:  06/14/15 72.122 kg (159 lb)  05/18/15 69.854 kg (154 lb)  05/11/15 69.854 kg (154 lb)     Intake/Output Summary (Last 24 hours) at 06/16/15 1023 Last data filed at 06/16/15 0932  Gross per 24 hour  Intake      0 ml  Output   1403  ml  Net  -1403 ml    Physical Exam:   GENERAL: Pleasant-appearing in no apparent distress.  HEAD, EYES, EARS, NOSE AND THROAT: Atraumatic, normocephalic. Extraocular muscles are intact. Pupils equal and reactive to light. Sclerae anicteric. No conjunctival injection. No oro-pharyngeal erythema.  NECK: Supple. There is no jugular venous distention. No bruits, no lymphadenopathy, no thyromegaly.  HEART: Regular rate and rhythm,. No murmurs, no rubs, no clicks.  LUNGS: clear  auscultation bilaterally bilaterally , not using  accessory muscles of respiration.  ABDOMEN: Soft, flat, nontender, nondistended. Has good bowel sounds. No hepatosplenomegaly appreciated.  EXTREMITIES: No evidence of any cyanosis, clubbing, or peripheral edema.  +2 pedal and radial pulses bilaterally.  NEUROLOGIC: The patient is alert, awake, and oriented x3 with no focal motor or sensory deficits appreciated bilaterally.  SKIN: Moist and warm with no rashes appreciated.  Psych: Not anxious, depressed LN: No inguinal LN enlargement    Antibiotics   Anti-infectives    Start     Dose/Rate Route Frequency Ordered Stop   06/15/15 1800  cefTRIAXone (ROCEPHIN) 2 g in dextrose 5 % 50 mL IVPB     2 g 100 mL/hr over 30 Minutes Intravenous Every 24 hours 06/14/15 1656     06/14/15 2200  cefTRIAXone (ROCEPHIN) 2 g in dextrose 5 % 50 mL IVPB     2 g 100 mL/hr over 30 Minutes Intravenous  Once 06/14/15 1656 06/14/15 2149   06/14/15 1800  levofloxacin (LEVAQUIN) IVPB 750 mg  Status:  Discontinued     750 mg 100 mL/hr over 90 Minutes Intravenous Every 24 hours 06/14/15 1647 06/14/15 1654   06/14/15 1415  vancomycin (VANCOCIN) IVPB 1000 mg/200 mL premix     1,000 mg 200 mL/hr over 60 Minutes Intravenous  Once 06/14/15 1405 06/14/15 1636   06/14/15 1415  ceFEPIme (MAXIPIME) 2 g in dextrose 5 % 50 mL IVPB    Comments:  Concern for sepsis   2 g 100 mL/hr over 30 Minutes Intravenous  Once 06/14/15 1405 06/14/15 1612       Medications   Scheduled Meds: . acidophilus  1 capsule Oral Daily  . aspirin EC  81 mg Oral Daily  . cefTRIAXone (ROCEPHIN)  IV  2 g Intravenous Q24H  . Chlorhexidine Gluconate Cloth  6 each Topical Q0600  . diatrizoate meglumine-sodium  15 mL Oral Once  . digoxin  0.125 mg Oral q morning - 10a  . enoxaparin (LOVENOX) injection  1.5 mg/kg Subcutaneous Q24H  . gabapentin  300 mg Oral TID  . hydrocortisone  10 mg Oral BID  . insulin aspart  0-5 Units Subcutaneous QHS  . insulin aspart  0-9 Units Subcutaneous TID WC  . levothyroxine  50 mcg Oral QAC breakfast  . [START ON 06/17/2015] levothyroxine  50 mcg Oral QAC breakfast  . methenamine  1 g Oral BID WC  . mupirocin ointment  1 application Nasal BID  . pantoprazole  40 mg Oral QAC breakfast  . pyridostigmine  60 mg Oral BID  . simvastatin  20 mg Oral q1800  . vitamin C  500 mg Oral TID   Continuous Infusions:   PRN Meds:.acetaminophen **OR** acetaminophen, albuterol, furosemide, guaiFENesin-codeine, ondansetron **OR** ondansetron (ZOFRAN) IV   Data Review:   Micro Results Recent Results (from the past 240 hour(s))  Culture, blood (routine x 2)     Status: None (Preliminary result)   Collection Time: 06/14/15  8:54 AM  Result Value Ref Range Status   Specimen Description BLOOD LEFT WRIST  Final   Special Requests BOTTLES DRAWN AEROBIC AND ANAEROBIC [place]  Final   Culture NO GROWTH 2 DAYS  Final   Report Status PENDING  Incomplete  Culture, blood (routine x 2)     Status: None (Preliminary result)   Collection Time: 06/14/15 10:34 AM  Result Value Ref Range Status   Specimen Description BLOOD LEFT ASSIST CONTROL  Final   Special Requests BOTTLES DRAWN AEROBIC AND ANAEROBIC [place]  Final  Culture NO GROWTH 2 DAYS  Final   Report Status PENDING  Incomplete  MRSA PCR Screening     Status: Abnormal   Collection Time: 06/15/15 11:02 AM  Result Value Ref Range Status   MRSA by PCR POSITIVE (A) NEGATIVE  Final    Comment:        The GeneXpert MRSA Assay (FDA approved for NASAL specimens only), is one component of a comprehensive MRSA colonization surveillance program. It is not intended to diagnose MRSA infection nor to guide or monitor treatment for MRSA infections. CRITICAL RESULT CALLED TO, READ BACK BY AND VERIFIED WITH: Isaiah Serge RN AT [id number] 06/15/15 MSS.     Radiology Reports Dg Chest 2 View  05/18/2015  CLINICAL DATA:  Body ache, nausea and fever for 1 day. Chronic cough. History of lung cancer and atrial fibrillation. EXAM: CHEST  2 VIEW COMPARISON:  Chest radiograph May 11, 2015 and PET-CT March 02, 2015 FINDINGS: Stable chronic changes RIGHT lung with volume loss, pleural thickening, prominent interstitial densities and central consolidation. Hyperaerated LEFT lung with chronic interstitial changes. LEFT cardiac defibrillator in situ. Status post cardiac valve replacement. No pneumothorax. Osteopenia. Moderate degenerative change of the thoracic spine with broad dextroscoliosis. IMPRESSION: Stable chronic changes RIGHT lung with volume loss, pleural thickening, interstitial prominence and underlying consolidation. Electronically Signed   By: Elon Alas M.D.   On: 05/18/2015 22:54   Dg Abd 1 View  05/31/2015  CLINICAL DATA:  Recurrent UTIs EXAM: ABDOMEN - 1 VIEW COMPARISON:  None. FINDINGS: Scattered large and small bowel gas is noted. Scoliosis concave to the right is noted in the lumbar spine. No compression deformities are seen. No acute bony abnormality is noted. Diffuse vascular calcifications are seen IMPRESSION: No acute abnormality noted. Electronically Signed   By: Inez Catalina M.D.   On: 05/31/2015 14:35   Ct Chest W Contrast  06/15/2015  CLINICAL DATA:  Sepsis. Dehydration. Nausea, vomiting. Abnormal chest x-ray. Increased volume loss on the right. History of lung cancer. EXAM: CT CHEST WITH CONTRAST TECHNIQUE: Multidetector CT imaging of the chest was  performed during intravenous contrast administration. CONTRAST:  39m ISOVUE-300 IOPAMIDOL (ISOVUE-300) INJECTION 61% COMPARISON:  Chest x-ray 06/14/2015, PET-CT 03/02/2015 FINDINGS: Heart: Left-sided AICD with leads to the coronary sinus, right atrium, right ventricle. Heart is mildly enlarged. Extensive coronary artery calcification. Normal RV: LV configuration. Vascular structures: Large amount of thrombus identified within the right main pulmonary artery. Thrombus extends into the right hilar region, attenuating the right upper lobe pulmonary branch. As the low density within the vessel is contiguous with the surrounding hilar mass, tumor thrombus or contiguous invasion of the pulmonary artery is a consideration. Study was not performed for dedicated evaluation of the smaller pulmonary arteries. There is extensive atherosclerotic calcification of the thoracic aorta, not associated with aneurysm. Aorta is tortuous. Mediastinum/thyroid: Mediastinal adenopathy similar to prior study. Right paratracheal lymph node is 1.6 cm on image 22 of series 2. Right hilar lymphadenopathy is contiguous with pulmonary parenchymal opacity. Left hilar adenopathy measures up to 1.3 cm in diameter. Smaller more confluent bilateral hilar lymph nodes also noted. The esophagus is filled with fluid.  Hiatal hernia is present. Lungs/Airways: Large right pleural effusion is identified, next in attenuation and including chylous components. Effusion is similar in size to the previous exam in January. There is enhancement of the visceral and parietal pleura, consistent with inflammation/ empyema. No fluid identified within the pleural effusion. Extensive bronchiectasis noted within the right lung. Subpleural reticular thickening  noted bilaterally, similar in appearance to the previous exam accounting for technique differences. Small cystic spaces is identified at the right lung apex, stable in appearance and measuring 1.8 cm. Upper abdomen:  Gallstone. Suspect gallbladder wall thickening. Tablets identified within the stomach. Visualized portions of the adrenal glands are normal. Chest wall/osseous structures: Unremarkable. IMPRESSION: 1. Thrombus within the right main and right upper lobe pulmonary artery. This could represent direct extension of tumor into the right pulmonary artery vs embolized venous thrombus. Correlation with lower extremity Doppler study is recommended. 2. No CT evidence for right heart strain. 3. Moderate mixed attenuation right pleural effusion trauma with chylous components. 4. Pleural enhancement on the right consistent with inflammation/empyema. 5. Persistent significant bronchiectasis and reticular changes in the lungs bilaterally, right greater than left. Overall appearance is unchanged since January. 6. Cholelithiasis. 7. Coronary artery disease. Critical Value/emergent results were called by telephone at the time of interpretation on 06/15/2015 at 12:41 pm to Dr. Dustin Flock , who verbally acknowledged these results. Electronically Signed   By: Nolon Nations M.D.   On: 06/15/2015 12:42   Ct Abdomen Pelvis W Contrast  06/14/2015  CLINICAL DATA:  Nausea and vomiting for 2 days with abdominal pain, history of lung carcinoma EXAM: CT ABDOMEN AND PELVIS WITH CONTRAST TECHNIQUE: Multidetector CT imaging of the abdomen and pelvis was performed using the standard protocol following bolus administration of intravenous contrast. CONTRAST:  126m ISOVUE-300 IOPAMIDOL (ISOVUE-300) INJECTION 61% COMPARISON:  03/02/2015 FINDINGS: Lung bases demonstrate bilateral scarring without focal confluent infiltrate. A large chronic pleural effusion on the right is noted stable from the previous exam. Liver is homogeneous in attenuation without focal infiltrate. The gallbladder demonstrates a single dependent gallstone without complicating factors. The spleen, adrenal glands and pancreas are within normal limits. The kidneys are well  visualized bilaterally. No renal calculi or obstructive changes are seen. A few tiny hypodensities are noted within the kidneys consistent with small cysts. Prostate is enlarged calcifications. The bladder is well distended without filling defect. The appendix has been surgically removed. Scattered diverticular change is noted without evidence of diverticulitis. Mild ectasia of the abdominal aorta is noted to 2.5 cm. Heavy calcifications are noted. A small hiatal hernia is noted. Degenerative changes of lumbar spine are seen. IMPRESSION: Cholelithiasis without complicating factors. Chronic right-sided pleural effusion. No acute abnormality noted. Electronically Signed   By: MInez CatalinaM.D.   On: 06/14/2015 12:40   Dg Chest Port 1 View  06/14/2015  CLINICAL DATA:  Generalized weakness for 2 days. Fever for 1 day. History of lung carcinoma EXAM: PORTABLE CHEST 1 VIEW COMPARISON:  May 11, 2015 FINDINGS: There is extensive volume loss on the right. There are areas of loculated effusion as well as reticulonodular interstitial disease throughout the right lung. On the left, there is fine reticular interstitial disease diffusely. There is no volume loss left. The heart size is within normal limits. Pacemaker leads are attached to the right atrium, right ventricle, and left ventricle. No pneumothorax. IMPRESSION: Diffuse interstitial disease with superimposed effusion and atelectasis on the right with extensive volume loss the right. Superimposed pneumonia on the right cannot be entirely excluded in this circumstance. The left lung is mildly hyperexpanded with fine reticular interstitial disease diffusely throughout the left lung. Stable cardiac silhouette. Electronically Signed   By: WLowella GripIII M.D.   On: 06/14/2015 17:15     CBC  Recent Labs Lab 06/14/15 0854 06/15/15 0401 06/16/15 0413  WBC 17.2* 10.3 9.2  HGB 13.1 12.2* 11.6*  HCT 39.7* 36.6* 35.6*  PLT 182 150 171  MCV 87.4 88.0 90.6   MCH 28.8 29.4 29.6  MCHC 32.9 33.4 32.7  RDW 17.3* 17.1* 17.2*    Chemistries   Recent Labs Lab 06/14/15 0854 06/14/15 1659 06/15/15 0401 06/16/15 0413  NA 135  --  136 138  K 4.2  --  3.8 4.1  CL 103  --  105 106  CO2 22  --  25 26  GLUCOSE 115*  --  78 112*  BUN 32*  --  27* 21*  CREATININE 0.96  --  0.81 0.79  CALCIUM 9.3  --  8.9 8.7*  MG  --  1.7  --   --   AST 27  --   --   --   ALT 15*  --   --   --   ALKPHOS 75  --   --   --   BILITOT 1.5*  --   --   --    ------------------------------------------------------------------------------------------------------------------ estimated creatinine clearance is 77.6 mL/min (by C-G formula based on Cr of 0.79). ------------------------------------------------------------------------------------------------------------------ No results for input(s): HGBA1C in the last 72 hours. ------------------------------------------------------------------------------------------------------------------ No results for input(s): CHOL, HDL, LDLCALC, TRIG, CHOLHDL, LDLDIRECT in the last 72 hours. ------------------------------------------------------------------------------------------------------------------ No results for input(s): TSH, T4TOTAL, T3FREE, THYROIDAB in the last 72 hours.  Invalid input(s): FREET3 ------------------------------------------------------------------------------------------------------------------ No results for input(s): VITAMINB12, FOLATE, FERRITIN, TIBC, IRON, RETICCTPCT in the last 72 hours.  Coagulation profile No results for input(s): INR, PROTIME in the last 168 hours.  No results for input(s): DDIMER in the last 72 hours.  Cardiac Enzymes No results for input(s): CKMB, TROPONINI, MYOGLOBIN in the last 168 hours.  Invalid input(s): CK ------------------------------------------------------------------------------------------------------------------ Invalid input(s): [place]   Patient is a 81 year old white male admitted with sepsis  1. Sepsis with UTI  Urine  cultures are not sent to the emergency room, reordered them patient had history of MRSA, Escherichia coli UTI  Continue ceftriaxone  2.Dehydration. Improved;d/c iv fluids  3. Nausea vomiting diarrhea now resolved.   4. Abnormal chest x-ray;. Thrombus within the right main and right upper lobe pulmonary artery. This could represent direct extension of tumor into the right pulmonary artery vs embolized venous thrombus started on Lovenox. ; echo ultrasound of the legs, start the patient on eliquis or   Xarelto at the time of discharge   Vision has history of lung cancer before. 5.Chronic systolic CHF with ejection fraction 20%. Stablehe started the home medication. Has history of ICD placement before; continue new Lasix, digoxin. 6. Chronic A. Fib. Rate controlled. Continue aspirin and digoxin.  7. Hypertension. Started lasix,bp better   9. Miscellaneous Lovenox for DVT prophylaxis  at 10 .history of recurrent UTIs: Patient the has urologist as an outpatient also does self catheter. Urine cultures are not done unfortunately during this admission. Ordered them.  D/w daughter      Code Status Orders        Start     Ordered   06/14/15 1640  Do not attempt resuscitation (DNR)   Continuous    Question Answer Comment  In the event of cardiac or respiratory ARREST Do not call a "code blue"   In the event of cardiac or respiratory ARREST Do not perform Intubation, CPR, defibrillation or ACLS   In the event of cardiac or respiratory ARREST Use medication by any route, position, wound care, and other measures to relive pain  and suffering. May use oxygen, suction and manual treatment of airway obstruction as needed for comfort.      06/14/15 1639    Code Status History    Date Active Date Inactive Code Status Order ID Comments User Context   This patient has a current code status but no historical code  status.    Advance Directive Documentation        Most Recent Value   Type of Advance Directive  Living will   Pre-existing out of facility DNR order (yellow form or pink MOST form)     "MOST" Form in Place?             Consults  None DVT Prophylaxis  Lovenox Lab Results  Component Value Date   PLT 171 06/16/2015     Time Spent in minutes   31mn  Greater than 50% of time spent in care coordination and counseling patient regarding the condition and plan of care.   KEpifanio LeschesM.D on 06/16/2015 at 10:23 AM  Between 7am to 6pm - Pager - [phone]  After 6pm go to [email or web address] - password [email or web address]   Office  [phone]

## 2015-06-17 LAB — GLUCOSE, CAPILLARY
GLUCOSE-CAPILLARY: 88 mg/dL (ref 65–99)
Glucose-Capillary: 169 mg/dL — ABNORMAL HIGH (ref 65–99)
Glucose-Capillary: 118 mg/dL — ABNORMAL HIGH (ref 65–99)
Glucose-Capillary: 139 mg/dL — ABNORMAL HIGH (ref 65–99)

## 2015-06-17 MED ORDER — APIXABAN 5 MG PO TABS
10.0000 mg | ORAL_TABLET | Freq: Two times a day (BID) | ORAL | Status: DC
  Administered 2015-06-17 – 2015-06-18 (×3): 10 mg via ORAL
  Filled 2015-06-17 (×3): qty 2

## 2015-06-17 MED ORDER — APIXABAN 5 MG PO TABS: 5.0000 mg | ORAL_TABLET | Freq: Two times a day (BID) | ORAL | Status: DC

## 2015-06-17 NOTE — Evaluation (Addendum)
Physical Therapy Evaluation Patient Details Name: Russell Howard MRN: [id number] DOB: [birth year] Today's Date: 06/17/2015   History of Present Illness  81yo white male comes [facility] after N/V/D x2 days, with weakness. Pt found to be septic. PMH: CHF, afib, Lung CA, platypnea, and EF20%. At baseline pt lives wth Son and DIL, performin gmostly room-to-room AMB with RW, and using a WC for mobilty outside of the home (doe snot self propel much). Pt requires 50% assistance for all ADL, except for feeding.   Clinical Impression  At evaluation, pt is received semirecumbent in chair (platypnea) upon entry, alert and oriented, and agreeable to participate with PT evaluation. No loss of balance noted in session, pt reports no falls history, and generally good balance. Pt's global strength as screened during functional mobility assessment presents with mild impairment in BLE noted during transfers, however the patient performs near or at baseline. Pt reports he feels as though his overall ambulation is impaired by ~10%. Most limiting is DOE with gait trial, which is a chronic problem which patient relates to his low EF. Patient presenting with impairment of strength, balance, and activity tolerance, with mild acute impairment. No additional PT services are needed at this time. PT signing off. Pt is motivated to ambulate the room, and I have encouraged him to do so ad lib with the supervision of nursing. Recommend HHPT at DC to recover deconditioned state and to progress balance training.          Follow Up Recommendations Home health PT    Equipment Recommendations       Recommendations for Other Services Other (comment) (Continue to mobilize with nursing ad lib. )     Precautions / Restrictions Precautions Precautions: Fall      Mobility  Bed Mobility               General bed mobility comments: received up in chair.   Transfers Overall transfer level: Needs assistance Equipment used:  Rolling walker (2 wheeled) Transfers: Sit to/from Stand Sit to Stand: Supervision            Ambulation/Gait Ambulation/Gait assistance: Supervision Ambulation Distance (Feet): 80 Feet Assistive device: Rolling walker (2 wheeled) Gait Pattern/deviations: Step-through pattern   Gait velocity interpretation: <1.8 ft/sec, indicative of risk for recurrent falls General Gait Details: some difficulty controlling the walker during turns. walks on slightly bent knees which he says is a chronic issue, and c/o arthitic knee pain, which he says is worse since he has limited his mobility this week.   Stairs            Wheelchair Mobility    Modified Rankin (Stroke Patients Only)       Balance Overall balance assessment: No apparent balance deficits (not formally assessed)                                           Pertinent Vitals/Pain Pain Assessment: No/denies pain    Home Living Family/patient expects to be discharged to:: Private residence Living Arrangements: Children Available Help at Discharge: Family Type of Home: House Home Access: Ramped entrance     [place]: One [place]: Environmental consultant - 2 wheels;Wheelchair - manual      Prior Function Level of Independence: Needs assistance   Gait / Transfers Assistance Needed: room to room AMB with RW only due to chronic CM.  ADL's /  Homemaking Assistance Needed: Req 50% assistance with bathing, dressing, and toiletting.         Hand Dominance        Extremity/Trunk Assessment   Upper Extremity Assessment: Overall WFL for tasks assessed           Lower Extremity Assessment: Generalized weakness         Communication   Communication: No difficulties  Cognition Arousal/Alertness: Awake/alert Behavior During Therapy: WFL for tasks assessed/performed Overall Cognitive Status: Within Functional Limits for tasks assessed                      General Comments       Exercises General Exercises - Lower Extremity Long Arc Quad: AROM;Seated;Both;20 reps      Assessment/Plan    PT Assessment All further PT needs can be met in the next venue of care  PT Diagnosis Difficulty walking;Generalized weakness   PT Problem List Decreased activity tolerance;Decreased strength;Decreased balance;Decreased coordination;Decreased cognition;Cardiopulmonary status limiting activity  PT Treatment Interventions     PT Goals (Current goals can be found in the Care Plan section) Acute Rehab PT Goals PT Goal Formulation: All assessment and education complete, DC therapy    Frequency     Barriers to discharge        Co-evaluation               End of Session Equipment Utilized During Treatment: Gait belt Activity Tolerance: Patient tolerated treatment well;Patient limited by fatigue;Patient limited by pain Patient left: in chair Nurse Communication: Mobility status;Other (comment);Precautions         Time: 1450-1513 PT Time Calculation (min) (ACUTE ONLY): 23 min   Charges:   PT Evaluation $PT Eval Moderate Complexity: 1 Procedure PT Treatments $Therapeutic Activity: 8-22 mins   PT G Codes:        3:31 PM, 24-Jun-2015 Etta Grandchild, PT, DPT PRN Physical Therapist - [place] License # [id number] [phone] (mobile)

## 2015-06-17 NOTE — Progress Notes (Signed)
[place] at [facility]                                                                                                                                                                                            Patient Demographics   Russell Howard, is a 81 y.o. male, DOB - 03-30-1934, WNU:[id number]  Admit date - 06/14/2015   Admitting Physician Demetrios Loll, MD  Outpatient Primary MD for the patient is SPARKS,JEFFREY D, MD   LOS - 3  Subjective:still feels very weak and wants physical therapy evaluation. Does have some cough. No further diarrhea.   Review of systems ; EYES: No blurry or double vision.  ENT: No tinnitus. No postnasal drip. No redness of the oropharynx.  RESPIRATORY: Positive cough, no wheeze, no hemoptysis. No dyspnea.  CARDIOVASCULAR: No chest pain. No orthopnea. No palpitations. No syncope.  GASTROINTESTINAL: Resolved nausea, no vomiting or diarrhea. No abdominal pain. No melena or hematochezia.  GENITOURINARY: Chronic urinary difficulties does in and out catheter  ENDOCRINE: No polyuria or nocturia. No heat or cold intolerance.  HEMATOLOGY: No anemia. No bruising. No bleeding.  INTEGUMENTARY: No rashes. No lesions.  MUSCULOSKELETAL: No arthritis. No swelling. No gout.  NEUROLOGIC: No numbness, tingling, or ataxia. No seizure-type activity.  PSYCHIATRIC: No anxiety. No insomnia. No ADD.    Vitals:   Filed Vitals:   06/16/15 1306 06/16/15 1611 06/16/15 2048 06/17/15 0528  BP: 126/68  104/54 122/61  Pulse: 70  70 70  Temp:  98.1 F (36.7 C) 97.7 F (36.5 C) 98.1 F (36.7 C)  TempSrc:  Oral Oral Oral  Resp: 20  18   Height:      Weight:      SpO2: 98%  95% 96%    Wt Readings from Last 3 Encounters:  06/14/15 72.122 kg (159 lb)  05/18/15 69.854 kg (154 lb)  05/11/15 69.854 kg (154 lb)     Intake/Output Summary (Last 24 hours) at 06/17/15 0926 Last data filed at 06/17/15 0347  Gross per 24 hour  Intake     50 ml   Output   2025 ml  Net  -1975 ml    Physical Exam:   GENERAL: Pleasant-appearing in no apparent distress.  HEAD, EYES, EARS, NOSE AND THROAT: Atraumatic, normocephalic. Extraocular muscles are intact. Pupils equal and reactive to light. Sclerae anicteric. No conjunctival injection. No oro-pharyngeal erythema.  NECK: Supple. There is no jugular venous distention. No bruits, no lymphadenopathy, no thyromegaly.  HEART: Regular rate and rhythm,. No murmurs, no rubs, no clicks.  LUNGS: clear  auscultation bilaterally bilaterally , not using accessory  muscles of respiration.  ABDOMEN: Soft, flat, nontender, nondistended. Has good bowel sounds. No hepatosplenomegaly appreciated.  EXTREMITIES: No evidence of any cyanosis, clubbing, or peripheral edema.  +2 pedal and radial pulses bilaterally.  NEUROLOGIC: The patient is alert, awake, and oriented x3 with no focal motor or sensory deficits appreciated bilaterally.  SKIN: Moist and warm with no rashes appreciated.  Psych: Not anxious, depressed LN: No inguinal LN enlargement    Antibiotics   Anti-infectives    Start     Dose/Rate Route Frequency Ordered Stop   06/15/15 1800  cefTRIAXone (ROCEPHIN) 2 g in dextrose 5 % 50 mL IVPB     2 g 100 mL/hr over 30 Minutes Intravenous Every 24 hours 06/14/15 1656     06/14/15 2200  cefTRIAXone (ROCEPHIN) 2 g in dextrose 5 % 50 mL IVPB     2 g 100 mL/hr over 30 Minutes Intravenous  Once 06/14/15 1656 06/14/15 2149   06/14/15 1800  levofloxacin (LEVAQUIN) IVPB 750 mg  Status:  Discontinued     750 mg 100 mL/hr over 90 Minutes Intravenous Every 24 hours 06/14/15 1647 06/14/15 1654   06/14/15 1415  vancomycin (VANCOCIN) IVPB 1000 mg/200 mL premix     1,000 mg 200 mL/hr over 60 Minutes Intravenous  Once 06/14/15 1405 06/14/15 1636   06/14/15 1415  ceFEPIme (MAXIPIME) 2 g in dextrose 5 % 50 mL IVPB    Comments:  Concern for sepsis   2 g 100 mL/hr over 30 Minutes Intravenous  Once 06/14/15 1405 06/14/15  1612      Medications   Scheduled Meds: . acidophilus  1 capsule Oral Daily  . aspirin EC  81 mg Oral Daily  . cefTRIAXone (ROCEPHIN)  IV  2 g Intravenous Q24H  . Chlorhexidine Gluconate Cloth  6 each Topical Q0600  . diatrizoate meglumine-sodium  15 mL Oral Once  . digoxin  0.125 mg Oral q morning - 10a  . gabapentin  300 mg Oral TID  . hydrocortisone  10 mg Oral BID  . insulin aspart  0-5 Units Subcutaneous QHS  . insulin aspart  0-9 Units Subcutaneous TID WC  . levothyroxine  50 mcg Oral QAC breakfast  . mupirocin ointment  1 application Nasal BID  . pantoprazole  40 mg Oral QAC breakfast  . pyridostigmine  60 mg Oral BID  . simvastatin  20 mg Oral q1800  . vitamin C  500 mg Oral TID   Continuous Infusions:   PRN Meds:.acetaminophen **OR** acetaminophen, albuterol, furosemide, guaiFENesin-codeine, ondansetron **OR** ondansetron (ZOFRAN) IV   Data Review:   Micro Results Recent Results (from the past 240 hour(s))  Culture, blood (routine x 2)     Status: None (Preliminary result)   Collection Time: 06/14/15  8:54 AM  Result Value Ref Range Status   Specimen Description BLOOD LEFT WRIST  Final   Special Requests BOTTLES DRAWN AEROBIC AND ANAEROBIC [place]  Final   Culture NO GROWTH 2 DAYS  Final   Report Status PENDING  Incomplete  Culture, blood (routine x 2)     Status: None (Preliminary result)   Collection Time: 06/14/15 10:34 AM  Result Value Ref Range Status   Specimen Description BLOOD LEFT ASSIST CONTROL  Final   Special Requests BOTTLES DRAWN AEROBIC AND ANAEROBIC [place]  Final   Culture NO GROWTH 2 DAYS  Final   Report Status PENDING  Incomplete  MRSA PCR Screening     Status: Abnormal   Collection Time: 06/15/15 11:02 AM  Result Value Ref Range Status   MRSA by PCR POSITIVE (A) NEGATIVE Final    Comment:        The GeneXpert MRSA Assay (FDA approved for NASAL specimens only), is one component of a comprehensive MRSA  colonization surveillance program. It is not intended to diagnose MRSA infection nor to guide or monitor treatment for MRSA infections. CRITICAL RESULT CALLED TO, READ BACK BY AND VERIFIED WITH: Isaiah Serge RN AT [id number] 06/15/15 MSS.   Urine culture     Status: None (Preliminary result)   Collection Time: 06/16/15  1:06 PM  Result Value Ref Range Status   Specimen Description URINE, RANDOM  Final   Special Requests Normal  Final   Culture NO GROWTH < 24 HOURS  Final   Report Status PENDING  Incomplete  C difficile quick scan w PCR reflex     Status: None   Collection Time: 06/16/15  5:26 PM  Result Value Ref Range Status   C Diff antigen NEGATIVE NEGATIVE Final   C Diff toxin NEGATIVE NEGATIVE Final   C Diff interpretation Negative for C. difficile  Final    Radiology Reports Dg Chest 2 View  05/18/2015  CLINICAL DATA:  Body ache, nausea and fever for 1 day. Chronic cough. History of lung cancer and atrial fibrillation. EXAM: CHEST  2 VIEW COMPARISON:  Chest radiograph May 11, 2015 and PET-CT March 02, 2015 FINDINGS: Stable chronic changes RIGHT lung with volume loss, pleural thickening, prominent interstitial densities and central consolidation. Hyperaerated LEFT lung with chronic interstitial changes. LEFT cardiac defibrillator in situ. Status post cardiac valve replacement. No pneumothorax. Osteopenia. Moderate degenerative change of the thoracic spine with broad dextroscoliosis. IMPRESSION: Stable chronic changes RIGHT lung with volume loss, pleural thickening, interstitial prominence and underlying consolidation. Electronically Signed   By: Elon Alas M.D.   On: 05/18/2015 22:54   Dg Abd 1 View  05/31/2015  CLINICAL DATA:  Recurrent UTIs EXAM: ABDOMEN - 1 VIEW COMPARISON:  None. FINDINGS: Scattered large and small bowel gas is noted. Scoliosis concave to the right is noted in the lumbar spine. No compression deformities are seen. No acute bony abnormality is noted. Diffuse  vascular calcifications are seen IMPRESSION: No acute abnormality noted. Electronically Signed   By: Inez Catalina M.D.   On: 05/31/2015 14:35   Ct Chest W Contrast  06/15/2015  CLINICAL DATA:  Sepsis. Dehydration. Nausea, vomiting. Abnormal chest x-ray. Increased volume loss on the right. History of lung cancer. EXAM: CT CHEST WITH CONTRAST TECHNIQUE: Multidetector CT imaging of the chest was performed during intravenous contrast administration. CONTRAST:  91m ISOVUE-300 IOPAMIDOL (ISOVUE-300) INJECTION 61% COMPARISON:  Chest x-ray 06/14/2015, PET-CT 03/02/2015 FINDINGS: Heart: Left-sided AICD with leads to the coronary sinus, right atrium, right ventricle. Heart is mildly enlarged. Extensive coronary artery calcification. Normal RV: LV configuration. Vascular structures: Large amount of thrombus identified within the right main pulmonary artery. Thrombus extends into the right hilar region, attenuating the right upper lobe pulmonary branch. As the low density within the vessel is contiguous with the surrounding hilar mass, tumor thrombus or contiguous invasion of the pulmonary artery is a consideration. Study was not performed for dedicated evaluation of the smaller pulmonary arteries. There is extensive atherosclerotic calcification of the thoracic aorta, not associated with aneurysm. Aorta is tortuous. Mediastinum/thyroid: Mediastinal adenopathy similar to prior study. Right paratracheal lymph node is 1.6 cm on image 22 of series 2. Right hilar lymphadenopathy is contiguous with pulmonary parenchymal opacity. Left hilar adenopathy measures up  to 1.3 cm in diameter. Smaller more confluent bilateral hilar lymph nodes also noted. The esophagus is filled with fluid.  Hiatal hernia is present. Lungs/Airways: Large right pleural effusion is identified, next in attenuation and including chylous components. Effusion is similar in size to the previous exam in January. There is enhancement of the visceral and parietal  pleura, consistent with inflammation/ empyema. No fluid identified within the pleural effusion. Extensive bronchiectasis noted within the right lung. Subpleural reticular thickening noted bilaterally, similar in appearance to the previous exam accounting for technique differences. Small cystic spaces is identified at the right lung apex, stable in appearance and measuring 1.8 cm. Upper abdomen: Gallstone. Suspect gallbladder wall thickening. Tablets identified within the stomach. Visualized portions of the adrenal glands are normal. Chest wall/osseous structures: Unremarkable. IMPRESSION: 1. Thrombus within the right main and right upper lobe pulmonary artery. This could represent direct extension of tumor into the right pulmonary artery vs embolized venous thrombus. Correlation with lower extremity Doppler study is recommended. 2. No CT evidence for right heart strain. 3. Moderate mixed attenuation right pleural effusion trauma with chylous components. 4. Pleural enhancement on the right consistent with inflammation/empyema. 5. Persistent significant bronchiectasis and reticular changes in the lungs bilaterally, right greater than left. Overall appearance is unchanged since January. 6. Cholelithiasis. 7. Coronary artery disease. Critical Value/emergent results were called by telephone at the time of interpretation on 06/15/2015 at 12:41 pm to Dr. Dustin Flock , who verbally acknowledged these results. Electronically Signed   By: Nolon Nations M.D.   On: 06/15/2015 12:42   Ct Abdomen Pelvis W Contrast  06/14/2015  CLINICAL DATA:  Nausea and vomiting for 2 days with abdominal pain, history of lung carcinoma EXAM: CT ABDOMEN AND PELVIS WITH CONTRAST TECHNIQUE: Multidetector CT imaging of the abdomen and pelvis was performed using the standard protocol following bolus administration of intravenous contrast. CONTRAST:  135m ISOVUE-300 IOPAMIDOL (ISOVUE-300) INJECTION 61% COMPARISON:  03/02/2015 FINDINGS: Lung  bases demonstrate bilateral scarring without focal confluent infiltrate. A large chronic pleural effusion on the right is noted stable from the previous exam. Liver is homogeneous in attenuation without focal infiltrate. The gallbladder demonstrates a single dependent gallstone without complicating factors. The spleen, adrenal glands and pancreas are within normal limits. The kidneys are well visualized bilaterally. No renal calculi or obstructive changes are seen. A few tiny hypodensities are noted within the kidneys consistent with small cysts. Prostate is enlarged calcifications. The bladder is well distended without filling defect. The appendix has been surgically removed. Scattered diverticular change is noted without evidence of diverticulitis. Mild ectasia of the abdominal aorta is noted to 2.5 cm. Heavy calcifications are noted. A small hiatal hernia is noted. Degenerative changes of lumbar spine are seen. IMPRESSION: Cholelithiasis without complicating factors. Chronic right-sided pleural effusion. No acute abnormality noted. Electronically Signed   By: MInez CatalinaM.D.   On: 06/14/2015 12:40   [place]Venous Img Lower Bilateral  06/16/2015  CLINICAL DATA:  Right sided PE in the setting of lung carcinoma EXAM: BILATERAL LOWER EXTREMITY VENOUS DOPPLER ULTRASOUND TECHNIQUE: Gray-scale sonography with compression, as well as color and duplex ultrasound, were performed to evaluate the deep venous system from the level of the common femoral vein through the popliteal and proximal calf veins. COMPARISON:  None FINDINGS: Normal compressibility of the common femoral, superficial femoral, and popliteal veins, as well as the proximal calf veins. No filling defects to suggest DVT on grayscale or color Doppler imaging. Doppler waveforms show normal direction of  venous flow, normal respiratory phasicity and response to augmentation. IMPRESSION: 1. No evidence of lower extremity deep vein thrombosis, bilaterally.  Electronically Signed   By: Lucrezia Europe M.D.   On: 06/16/2015 12:41   Dg Chest Port 1 View  06/14/2015  CLINICAL DATA:  Generalized weakness for 2 days. Fever for 1 day. History of lung carcinoma EXAM: PORTABLE CHEST 1 VIEW COMPARISON:  May 11, 2015 FINDINGS: There is extensive volume loss on the right. There are areas of loculated effusion as well as reticulonodular interstitial disease throughout the right lung. On the left, there is fine reticular interstitial disease diffusely. There is no volume loss left. The heart size is within normal limits. Pacemaker leads are attached to the right atrium, right ventricle, and left ventricle. No pneumothorax. IMPRESSION: Diffuse interstitial disease with superimposed effusion and atelectasis on the right with extensive volume loss the right. Superimposed pneumonia on the right cannot be entirely excluded in this circumstance. The left lung is mildly hyperexpanded with fine reticular interstitial disease diffusely throughout the left lung. Stable cardiac silhouette. Electronically Signed   By: Lowella Grip III M.D.   On: 06/14/2015 17:15     CBC  Recent Labs Lab 06/14/15 0854 06/15/15 0401 06/16/15 0413  WBC 17.2* 10.3 9.2  HGB 13.1 12.2* 11.6*  HCT 39.7* 36.6* 35.6*  PLT 182 150 171  MCV 87.4 88.0 90.6  MCH 28.8 29.4 29.6  MCHC 32.9 33.4 32.7  RDW 17.3* 17.1* 17.2*    Chemistries   Recent Labs Lab 06/14/15 0854 06/14/15 1659 06/15/15 0401 06/16/15 0413  NA 135  --  136 138  K 4.2  --  3.8 4.1  CL 103  --  105 106  CO2 22  --  25 26  GLUCOSE 115*  --  78 112*  BUN 32*  --  27* 21*  CREATININE 0.96  --  0.81 0.79  CALCIUM 9.3  --  8.9 8.7*  MG  --  1.7  --   --   AST 27  --   --   --   ALT 15*  --   --   --   ALKPHOS 75  --   --   --   BILITOT 1.5*  --   --   --    ------------------------------------------------------------------------------------------------------------------ estimated creatinine clearance is 77.6 mL/min  (by C-G formula based on Cr of 0.79). ------------------------------------------------------------------------------------------------------------------ No results for input(s): HGBA1C in the last 72 hours. ------------------------------------------------------------------------------------------------------------------ No results for input(s): CHOL, HDL, LDLCALC, TRIG, CHOLHDL, LDLDIRECT in the last 72 hours. ------------------------------------------------------------------------------------------------------------------ No results for input(s): TSH, T4TOTAL, T3FREE, THYROIDAB in the last 72 hours.  Invalid input(s): FREET3 ------------------------------------------------------------------------------------------------------------------ No results for input(s): VITAMINB12, FOLATE, FERRITIN, TIBC, IRON, RETICCTPCT in the last 72 hours.  Coagulation profile No results for input(s): INR, PROTIME in the last 168 hours.  No results for input(s): DDIMER in the last 72 hours.  Cardiac Enzymes No results for input(s): CKMB, TROPONINI, MYOGLOBIN in the last 168 hours.  Invalid input(s): CK ------------------------------------------------------------------------------------------------------------------ Invalid input(s): [place]  Patient is a 81 year old white male admitted with sepsis  1. Sepsis with UTI  Urine  cultures are not sent to the emergency room, reordered them patient had history of MRSA, Escherichia coli UTI  Continue ceftriaxone The urine cultures from 5/13, no growth.  2.Dehydration. Improved;d/c iv fluids  3. Nausea vomiting diarrhea now resolved.   4. Abnormal chest x-ray;. Thrombus within the right main and right upper lobe  pulmonary artery. This could represent direct extension of tumor into theright pulmonary artery vs embolized venous thrombus started on Lovenox. ;  ultra sound of the legs did not show any DVT. Discussed the risks and benefits  of anticoagulations with the patient. Start the patient on [place].  has history of lung cancer before. 5.Chronic systolic CHF with ejection fraction 20%. Stablehe started the home medication. Has history of ICD placement before; continue new Lasix, digoxin. 6. Chronic A. Fib. Rate controlled. Continue aspirin and digoxin.  7. Hypertension. Started lasix,bp better   9. Miscellaneous Lovenox for DVT prophylaxis   10 .history of recurrent UTIs: Patient the has urologist as an outpatient also does self catheter. Urine cultures are not done unfortunately during this admission. Ordered them.  #11. deconditioning physical therapy  Consult today. Likely discharge tomorrow.     Code Status Orders        Start     Ordered   06/14/15 1640  Do not attempt resuscitation (DNR)   Continuous    Question Answer Comment  In the event of cardiac or respiratory ARREST Do not call a "code blue"   In the event of cardiac or respiratory ARREST Do not perform Intubation, CPR, defibrillation or ACLS   In the event of cardiac or respiratory ARREST Use medication by any route, position, wound care, and other measures to relive pain and suffering. May use oxygen, suction and manual treatment of airway obstruction as needed for comfort.      06/14/15 1639    Code Status History    Date Active Date Inactive Code Status Order ID Comments User Context   This patient has a current code status but no historical code status.    Advance Directive Documentation        Most Recent Value   Type of Advance Directive  Living will   Pre-existing out of facility DNR order (yellow form or pink MOST form)     "MOST" Form in Place?             Consults  None DVT Prophylaxis  Lovenox Lab Results  Component Value Date   PLT 171 06/16/2015     Time Spent in minutes   10mn  Greater than 50% of time spent in care coordination and counseling patient regarding the condition and plan of  care.   KEpifanio LeschesM.D on 06/17/2015 at 9:26 AM  Between 7am to 6pm - Pager - [phone]  After 6pm go to [email or web address] - password [email or web address]   Office  [phone]

## 2015-06-17 NOTE — Consult Note (Signed)
ANTICOAGULATION CONSULT NOTE - Initial Consult  Pharmacy Consult for apixaban Indication: pulmonary embolus  Allergies  Allergen Reactions  . Penicillin G Hives  . Sulfa Antibiotics Rash    Patient Measurements: Height: '5\' 10"'$  (177.8 cm) Weight: 159 lb (72.122 kg) IBW/kg (Calculated) : 73 Heparin Dosing Weight:   Vital Signs: Temp: 98.1 F (36.7 C) (05/14 0528) Temp Source: Oral (05/14 0528) BP: 122/61 mmHg (05/14 0528) Pulse Rate: 70 (05/14 0528)  Labs:  Recent Labs  06/15/15 0401 06/16/15 0413  HGB 12.2* 11.6*  HCT 36.6* 35.6*  PLT 150 171  CREATININE 0.81 0.79    Estimated Creatinine Clearance: 77.6 mL/min (by C-G formula based on Cr of 0.79).   Medical History: Past Medical History  Diagnosis Date  . CHF (congestive heart failure) ([place])   . Lung cancer ([place])   . Valvular heart disease   . Cardiomyopathy ([place])   . Acquired hypothyroidism 11/02/2014  . Chronic diastolic heart failure ([place]) 11/02/2014  . Cardiac defibrillator in place 11/02/2014  . Gastro-esophageal reflux disease without esophagitis 11/02/2014  . History of atrial fibrillation 11/02/2014  . H/O malignant neoplasm 11/09/2014  . Neuropathy ([place]) 11/02/2014  . Orthostasis 11/02/2014  . Pure hypercholesterolemia 11/02/2014  . Heart valve disease 11/02/2014  . BPH (benign prostatic hyperplasia)     Medications:  Scheduled:  . acidophilus  1 capsule Oral Daily  . apixaban  10 mg Oral BID   Followed by  . [START ON 06/24/2015] apixaban  5 mg Oral BID  . aspirin EC  81 mg Oral Daily  . cefTRIAXone (ROCEPHIN)  IV  2 g Intravenous Q24H  . Chlorhexidine Gluconate Cloth  6 each Topical Q0600  . diatrizoate meglumine-sodium  15 mL Oral Once  . digoxin  0.125 mg Oral q morning - 10a  . gabapentin  300 mg Oral TID  . hydrocortisone  10 mg Oral BID  . insulin aspart  0-5 Units Subcutaneous QHS  . insulin aspart  0-9 Units Subcutaneous TID WC  . levothyroxine  50 mcg Oral QAC breakfast  . mupirocin  ointment  1 application Nasal BID  . pantoprazole  40 mg Oral QAC breakfast  . pyridostigmine  60 mg Oral BID  . simvastatin  20 mg Oral q1800  . vitamin C  500 mg Oral TID    Assessment: Pt is a 81 year old male with a thrombus in the right main and right upper pulmonary lobe. Patient is currently on 1.'5mg'$ /kg q 24 hours of enoxaparin. Last dose at 1329 yesterday.   Goal of Therapy:   Monitor platelets by anticoagulation protocol: Yes   Plan:  apixaban '10mg'$  BID for 7 days then '5mg'$  BID. Will start first dose around 1100 today.  Ramond Dial, Pharm.D Clinical Pharmacist   06/17/2015,9:53 AM

## 2015-06-18 ENCOUNTER — Telehealth: Payer: Self-pay | Admitting: Urology

## 2015-06-18 LAB — URINE CULTURE
CULTURE: NO GROWTH
Special Requests: NORMAL

## 2015-06-18 LAB — GLUCOSE, CAPILLARY: Glucose-Capillary: 89 mg/dL (ref 65–99)

## 2015-06-18 MED ORDER — APIXABAN 5 MG PO TABS: 5.0000 mg | ORAL_TABLET | Freq: Two times a day (BID) | ORAL | Status: DC

## 2015-06-18 NOTE — Telephone Encounter (Signed)
Pt was discharged from [facility] 5/15.  Family took him for diarrhea, vomitting, and fever.  He was dehydrated, septic and had UTI.  They found blood clot to his lung.  His POA, [name], called to let us know.  She wants to know if he needs to be seen before his return date with you in August.  Please advise.  [phone]

## 2015-06-18 NOTE — Care Management Important Message (Signed)
Important Message  Patient Details  Name: Russell Howard MRN: [id number] Date of Birth: 08-11-1934   Medicare Important Message Given:  Yes    Marshell Garfinkel, RN 06/18/2015, 11:22 AM

## 2015-06-18 NOTE — Progress Notes (Signed)
Pt d/c home via wheelchair escorted by staff and auxillary

## 2015-06-18 NOTE — Discharge Summary (Signed)
Russell Howard, is a 81 y.o. male  DOB 12/14/1934  MRN [id number].  Admission date:  06/14/2015  Admitting Physician  Demetrios Loll, MD  Discharge Date:  06/18/2015   Primary MD  [name], MD  Recommendations for primary care physician for things to follow:   Follow-up with primary doctor in 1 week   Admission Diagnosis  Sepsis, due to unspecified organism ([place]) [A41.9] Hypotension, unspecified hypotension type [I95.9] Diarrhea, unspecified type [R19.7]   Discharge Diagnosis  Sepsis, due to unspecified organism ([place]) [A41.9] Hypotension, unspecified hypotension type [I95.9] Diarrhea, unspecified type [R19.7]    Principal Problem:   Sepsis ([place]) Active Problems:   UTI (lower urinary tract infection)      Past Medical History  Diagnosis Date  . CHF (congestive heart failure) ([place])   . Lung cancer ([place])   . Valvular heart disease   . Cardiomyopathy ([place])   . Acquired hypothyroidism 11/02/2014  . Chronic diastolic heart failure ([place]) 11/02/2014  . Cardiac defibrillator in place 11/02/2014  . Gastro-esophageal reflux disease without esophagitis 11/02/2014  . History of atrial fibrillation 11/02/2014  . H/O malignant neoplasm 11/09/2014  . Neuropathy ([place]) 11/02/2014  . Orthostasis 11/02/2014  . Pure hypercholesterolemia 11/02/2014  . Heart valve disease 11/02/2014  . BPH (benign prostatic hyperplasia)     Past Surgical History  Procedure Laterality Date  . Appendectomy    . Dual icd implant  2007/2009    implantable cardiac defibrillator  . Port-a-cath removal         History of present illness and  Hospital Course:     Kindly see H&P for history of present illness and admission details, please review complete Labs, Consult reports and Test reports for all details in brief  HPI  from the history and  physical done on the day of admission Admitted on May 11 for nausea vomiting diarrhea. Admitted for sepsis present on admission likely due to UTI. Started on IV Rocephin. Patient urinalysis on admission showed hazy urine with too numerous WBCs. But urine cultures were not sent from emergency room. Urine has no bacteria on admission.   Hospital Course  Sepsis present on admission; likely secondary to pneumonia: started on IV hydration, IV Rocephin, patient lactic acid 1.2 on admission. White count improved from 17.2 to 9.2.. Patient blood cultures did not show any growth. Urine cultures are not obtained in the emergency room. Patient follows up with Dr. Hollice Espy. From urology.and the has history of urinary retention and does self-catheterization twice a day. And patient has history of chronic colonization of bacteria without evidence of active infection. So patient has no bacteria on urinalysis I discontinued Rocephin and patient does not need any antibiotics at discharge, he can follow up with urology Dr. Erlene Quan as an out pt.utpatient. #2. Pulmonary emboli: Patient had shortness of breath, had h/o of lung cancer with chemotherapy and radiation. Chest x-ray on admission showed volume loss on the right side with possible superimposed pneumonia. CT chest showed thrombus in the right main and right upper lobe pulmonary arteries: Started on the Lovenox initially full dose. And ultrasound of the legs did not show any DVT. Patient CT chest was concerning for some pleural effusion or empyema but he had no evidence of persistent fevers or hypoxia. And WBC also is normal. Patient can follow up with Dr. Doy Hutching as an outpatient. He is in remission for history of lung cancer. Discussed the risk and benefits of anticoagulations with the patient. And he will be  discharged home with Eliquis 10 mg twice a day for 7 days after that continue 5 mg by mouth twice a day for at least 6 months and follow-up with primary  doctor in 1 week. #3 deconditioning: Physical therapy recommended home health. #4 history of chronic urinary retention: Patient does self-catheterization twice a day, follows up with Dr. Hollice Espy, takes hiprex,, saw palmetto, #5 chronic systolic heart failure with EF 20%: Status post ICD, Lasix, digoxin. #6. Diarrhea: Stool for C. difficile has been negative.  diarrhea resolved. #7 CODE STATUS is DO NOT RESUSCITATE.  Discharge Condition:stable   Follow UP      Discharge Instructions  and  Discharge Medications        Medication List    STOP taking these medications        Lutein 6 MG Caps      TAKE these medications        acidophilus Caps capsule  Take 1 capsule by mouth daily.     apixaban 5 MG Tabs tablet  Commonly known as:  ELIQUIS  Take 1 tablet (5 mg total) by mouth 2 (two) times daily.  Start taking on:  06/24/2015     aspirin EC 81 MG tablet  Take 81 mg by mouth daily.     Cholecalciferol 5000 units Tabs  Take 5,000 Units by mouth every morning.     digoxin 0.125 MG tablet  Commonly known as:  LANOXIN  Take 0.125 mg by mouth every morning.     furosemide 20 MG tablet  Commonly known as:  LASIX  Take 20 mg by mouth every other day as needed (as needed for water retention).     gabapentin 300 MG capsule  Commonly known as:  NEURONTIN  Take 300 mg by mouth 3 (three) times daily.     Glucosamine-Chondroitin Caps  Take 1 capsule by mouth 2 (two) times daily.     hydrocortisone 10 MG tablet  Commonly known as:  CORTEF  Take by mouth 2 (two) times daily.     levothyroxine 50 MCG tablet  Commonly known as:  SYNTHROID, LEVOTHROID  Take 50 mcg by mouth daily before breakfast.     methenamine 1 g tablet  Commonly known as:  HIPREX  Take 1 tablet (1 g total) by mouth 2 (two) times daily with a meal.     pantoprazole 40 MG tablet  Commonly known as:  PROTONIX  Take 40 mg by mouth daily before breakfast.     potassium chloride 10 MEQ CR  capsule  Commonly known as:  MICRO-K  Take 10 mEq by mouth every other day.     pyridostigmine 60 MG tablet  Commonly known as:  MESTINON  Take 60 mg by mouth 2 (two) times daily.     SAW PALMETTO-PUMPKIN SEED OIL PO  Take 1 capsule by mouth daily.     silodosin 8 MG Caps capsule  Commonly known as:  RAPAFLO  Take 8 mg by mouth daily with breakfast.     simvastatin 20 MG tablet  Commonly known as:  ZOCOR  Take 20 mg by mouth daily at 6 PM.     TH CO Q-10 100 MG capsule  Generic drug:  Coenzyme Q10  Take 100 mg by mouth daily.     vitamin B-12 500 MCG tablet  Commonly known as:  CYANOCOBALAMIN  Take 500 mcg by mouth daily.     vitamin C 1000 MG tablet  Take 500 mg by mouth 3 (  three) times daily. Reported on 06/14/2015     vitamin E 400 UNIT capsule  Take 400 Units by mouth daily.          Diet and Activity recommendation: See Discharge Instructions above   Consults obtained - PT   Major procedures and Radiology Reports - PLEASE review detailed and final reports for all details, in brief -      Dg Abd 1 View  05/31/2015  CLINICAL DATA:  Recurrent UTIs EXAM: ABDOMEN - 1 VIEW COMPARISON:  None. FINDINGS: Scattered large and small bowel gas is noted. Scoliosis concave to the right is noted in the lumbar spine. No compression deformities are seen. No acute bony abnormality is noted. Diffuse vascular calcifications are seen IMPRESSION: No acute abnormality noted. Electronically Signed   By: Inez Catalina M.D.   On: 05/31/2015 14:35   Ct Chest W Contrast  06/15/2015  CLINICAL DATA:  Sepsis. Dehydration. Nausea, vomiting. Abnormal chest x-ray. Increased volume loss on the right. History of lung cancer. EXAM: CT CHEST WITH CONTRAST TECHNIQUE: Multidetector CT imaging of the chest was performed during intravenous contrast administration. CONTRAST:  1m ISOVUE-300 IOPAMIDOL (ISOVUE-300) INJECTION 61% COMPARISON:  Chest x-ray 06/14/2015, PET-CT 03/02/2015 FINDINGS: Heart:  Left-sided AICD with leads to the coronary sinus, right atrium, right ventricle. Heart is mildly enlarged. Extensive coronary artery calcification. Normal RV: LV configuration. Vascular structures: Large amount of thrombus identified within the right main pulmonary artery. Thrombus extends into the right hilar region, attenuating the right upper lobe pulmonary branch. As the low density within the vessel is contiguous with the surrounding hilar mass, tumor thrombus or contiguous invasion of the pulmonary artery is a consideration. Study was not performed for dedicated evaluation of the smaller pulmonary arteries. There is extensive atherosclerotic calcification of the thoracic aorta, not associated with aneurysm. Aorta is tortuous. Mediastinum/thyroid: Mediastinal adenopathy similar to prior study. Right paratracheal lymph node is 1.6 cm on image 22 of series 2. Right hilar lymphadenopathy is contiguous with pulmonary parenchymal opacity. Left hilar adenopathy measures up to 1.3 cm in diameter. Smaller more confluent bilateral hilar lymph nodes also noted. The esophagus is filled with fluid.  Hiatal hernia is present. Lungs/Airways: Large right pleural effusion is identified, next in attenuation and including chylous components. Effusion is similar in size to the previous exam in January. There is enhancement of the visceral and parietal pleura, consistent with inflammation/ empyema. No fluid identified within the pleural effusion. Extensive bronchiectasis noted within the right lung. Subpleural reticular thickening noted bilaterally, similar in appearance to the previous exam accounting for technique differences. Small cystic spaces is identified at the right lung apex, stable in appearance and measuring 1.8 cm. Upper abdomen: Gallstone. Suspect gallbladder wall thickening. Tablets identified within the stomach. Visualized portions of the adrenal glands are normal. Chest wall/osseous structures: Unremarkable.  IMPRESSION: 1. Thrombus within the right main and right upper lobe pulmonary artery. This could represent direct extension of tumor into the right pulmonary artery vs embolized venous thrombus. Correlation with lower extremity Doppler study is recommended. 2. No CT evidence for right heart strain. 3. Moderate mixed attenuation right pleural effusion trauma with chylous components. 4. Pleural enhancement on the right consistent with inflammation/empyema. 5. Persistent significant bronchiectasis and reticular changes in the lungs bilaterally, right greater than left. Overall appearance is unchanged since January. 6. Cholelithiasis. 7. Coronary artery disease. Critical Value/emergent results were called by telephone at the time of interpretation on 06/15/2015 at 12:41 pm to Dr. SDustin Flock, who verbally  acknowledged these results. Electronically Signed   By: Nolon Nations M.D.   On: 06/15/2015 12:42   Ct Abdomen Pelvis W Contrast  06/14/2015  CLINICAL DATA:  Nausea and vomiting for 2 days with abdominal pain, history of lung carcinoma EXAM: CT ABDOMEN AND PELVIS WITH CONTRAST TECHNIQUE: Multidetector CT imaging of the abdomen and pelvis was performed using the standard protocol following bolus administration of intravenous contrast. CONTRAST:  122m ISOVUE-300 IOPAMIDOL (ISOVUE-300) INJECTION 61% COMPARISON:  03/02/2015 FINDINGS: Lung bases demonstrate bilateral scarring without focal confluent infiltrate. A large chronic pleural effusion on the right is noted stable from the previous exam. Liver is homogeneous in attenuation without focal infiltrate. The gallbladder demonstrates a single dependent gallstone without complicating factors. The spleen, adrenal glands and pancreas are within normal limits. The kidneys are well visualized bilaterally. No renal calculi or obstructive changes are seen. A few tiny hypodensities are noted within the kidneys consistent with small cysts. Prostate is enlarged  calcifications. The bladder is well distended without filling defect. The appendix has been surgically removed. Scattered diverticular change is noted without evidence of diverticulitis. Mild ectasia of the abdominal aorta is noted to 2.5 cm. Heavy calcifications are noted. A small hiatal hernia is noted. Degenerative changes of lumbar spine are seen. IMPRESSION: Cholelithiasis without complicating factors. Chronic right-sided pleural effusion. No acute abnormality noted. Electronically Signed   By: MInez CatalinaM.D.   On: 06/14/2015 12:40   UKoreaVenous Img Lower Bilateral  06/16/2015  CLINICAL DATA:  Right sided PE in the setting of lung carcinoma EXAM: BILATERAL LOWER EXTREMITY VENOUS DOPPLER ULTRASOUND TECHNIQUE: Gray-scale sonography with compression, as well as color and duplex ultrasound, were performed to evaluate the deep venous system from the level of the common femoral vein through the popliteal and proximal calf veins. COMPARISON:  None FINDINGS: Normal compressibility of the common femoral, superficial femoral, and popliteal veins, as well as the proximal calf veins. No filling defects to suggest DVT on grayscale or color Doppler imaging. Doppler waveforms show normal direction of venous flow, normal respiratory phasicity and response to augmentation. IMPRESSION: 1. No evidence of lower extremity deep vein thrombosis, bilaterally. Electronically Signed   By: DLucrezia EuropeM.D.   On: 06/16/2015 12:41   Dg Chest Port 1 View  06/14/2015  CLINICAL DATA:  Generalized weakness for 2 days. Fever for 1 day. History of lung carcinoma EXAM: PORTABLE CHEST 1 VIEW COMPARISON:  May 11, 2015 FINDINGS: There is extensive volume loss on the right. There are areas of loculated effusion as well as reticulonodular interstitial disease throughout the right lung. On the left, there is fine reticular interstitial disease diffusely. There is no volume loss left. The heart size is within normal limits. Pacemaker leads are  attached to the right atrium, right ventricle, and left ventricle. No pneumothorax. IMPRESSION: Diffuse interstitial disease with superimposed effusion and atelectasis on the right with extensive volume loss the right. Superimposed pneumonia on the right cannot be entirely excluded in this circumstance. The left lung is mildly hyperexpanded with fine reticular interstitial disease diffusely throughout the left lung. Stable cardiac silhouette. Electronically Signed   By: WLowella GripIII M.D.   On: 06/14/2015 17:15    Micro Results     Recent Results (from the past 240 hour(s))  Culture, blood (routine x 2)     Status: None (Preliminary result)   Collection Time: 06/14/15  8:54 AM  Result Value Ref Range Status   Specimen Description BLOOD LEFT WRIST  Final  Special Requests BOTTLES DRAWN AEROBIC AND ANAEROBIC [place]  Final   Culture NO GROWTH 4 DAYS  Final   Report Status PENDING  Incomplete  Culture, blood (routine x 2)     Status: None (Preliminary result)   Collection Time: 06/14/15 10:34 AM  Result Value Ref Range Status   Specimen Description BLOOD LEFT ASSIST CONTROL  Final   Special Requests BOTTLES DRAWN AEROBIC AND ANAEROBIC [place]  Final   Culture NO GROWTH 4 DAYS  Final   Report Status PENDING  Incomplete  MRSA PCR Screening     Status: Abnormal   Collection Time: 06/15/15 11:02 AM  Result Value Ref Range Status   MRSA by PCR POSITIVE (A) NEGATIVE Final    Comment:        The GeneXpert MRSA Assay (FDA approved for NASAL specimens only), is one component of a comprehensive MRSA colonization surveillance program. It is not intended to diagnose MRSA infection nor to guide or monitor treatment for MRSA infections. CRITICAL RESULT CALLED TO, READ BACK BY AND VERIFIED WITH: Isaiah Serge RN AT [id number] 06/15/15 MSS.   Urine culture     Status: None (Preliminary result)   Collection Time: 06/16/15  1:06 PM  Result Value Ref Range Status   Specimen  Description URINE, RANDOM  Final   Special Requests Normal  Final   Culture NO GROWTH < 24 HOURS  Final   Report Status PENDING  Incomplete  C difficile quick scan w PCR reflex     Status: None   Collection Time: 06/16/15  5:26 PM  Result Value Ref Range Status   C Diff antigen NEGATIVE NEGATIVE Final   C Diff toxin NEGATIVE NEGATIVE Final   C Diff interpretation Negative for C. difficile  Final       Today   Subjective:   Russell Howard today has no headache,no chest abdominal pain,no new weakness tingling or numbness, feels much better wants to go home today.   Objective:   Blood pressure 119/62, pulse 71, temperature 98.2 F (36.8 C), temperature source Oral, resp. rate 20, height '5\' 10"'$  (1.778 m), weight 72.122 kg (159 lb), SpO2 96 %.   Intake/Output Summary (Last 24 hours) at 06/18/15 0842 Last data filed at 06/18/15 0330  Gross per 24 hour  Intake      0 ml  Output   1850 ml  Net  -1850 ml    Exam Awake Alert, Oriented x 3, No new F.N deficits, Normal affect [place].AT,PERRAL Supple Neck,No JVD, No cervical lymphadenopathy appriciated.  Symmetrical Chest wall movement, Good air movement bilaterally, CTAB RRR,No Gallops,Rubs or new Murmurs, No Parasternal Heave +ve B.Sounds, Abd Soft, Non tender, No organomegaly appriciated, No rebound -guarding or rigidity. No Cyanosis, Clubbing or edema, No new Rash or bruise  Data Review   CBC w Diff: Lab Results  Component Value Date   WBC 9.2 06/16/2015   HGB 11.6* 06/16/2015   HCT 35.6* 06/16/2015   PLT 171 06/16/2015   LYMPHOPCT 5 05/11/2015   MONOPCT 8 05/11/2015   EOSPCT 2 05/11/2015   BASOPCT 0 05/11/2015    CMP: Lab Results  Component Value Date   NA 138 06/16/2015   K 4.1 06/16/2015   CL 106 06/16/2015   CO2 26 06/16/2015   BUN 21* 06/16/2015   CREATININE 0.79 06/16/2015   PROT 8.1 06/14/2015   ALBUMIN 3.3* 06/14/2015   BILITOT 1.5* 06/14/2015   ALKPHOS 75 06/14/2015   AST 27 06/14/2015   ALT 15*  06/14/2015  .   Total Time in preparing paper work, data evaluation and todays exam - 59 minutes  Russell Howard M.D on 06/18/2015 at 8:42 AM    Note: This dictation was prepared with Dragon dictation along with smaller phrase technology. Any transcriptional errors that result from this process are unintentional.

## 2015-06-18 NOTE — Progress Notes (Signed)
[facility] referral received from [place], requesting physical therapy and nursing services. Russell Howard is a 81 year old man admitted to [facility] on 5/11 with abdominal pain, he was found to have a UTI. He has been treated with IV antibiotics. Of note patient had a Chest Ct on 5/12 revealed a thrombus within the right main and right upper lobe pulmonary artery, he is being discharged on Eliquis. Writer met in the room with patient and his daughter in law [name], [place] services outlined,  Information and contact number given to [place]. Patient to discharge today via [place]. Patient information faxed to referral. Thank you for the opportunity to be involved in the care of this patient and his family. Flo Shanks RN, BSN, [facility] Liaison [phone] c

## 2015-06-18 NOTE — Care Management (Addendum)
Spoke with DIL regarding home health and self cath company stating that patient cannot have home health and receive cath supplies. This doesn't make sense. DIL considering hospice however she wants to talk to [facility] first. Eliquis is pending certification [phone] ID [id number]. RNCM working on [employer] and home health with Santiago Glad through [facility]. Patient may discharge when ready as I can take care of these things and follow up with DIL by phone. I understand he has an appointment around 1030AM.  [facility]  : [phone] has Rx on file waiting for prior authorization. Approved by [facility]- [id number] Exp. 12/20/2015.[facility] updated about authorization; $35. DIL updated

## 2015-06-18 NOTE — Plan of Care (Signed)
Did discharge education for nurse.  Reviewed f/u appts w/pt and daughter.  Also reviewed new med, apixaban.  Reinforced education re: not to lift excessive weight or perform any activity which causes SOB, chest pain, dizziness.  Pt stated he doesn't do anything.  Also suggested that he weigh 1st thing in am to monitor any gain and inform dr of peripheral swelling and SOB.  Pt and daughter currently following protocol.  Care mgmt f/u re: Home PT.  Pt left w/volunteer for home.

## 2015-06-18 NOTE — Progress Notes (Signed)
Pharmacy Antibiotic Note  Russell Howard is a 81 y.o. male admitted on 06/14/2015 with sepsis and UTI. Patient has PCN allergy but apparently tolerated cefepime.   Plan:  Continue Rocephin 1 g IV q24 hours.   Height: '5\' 10"'$  (177.8 cm) Weight: 159 lb (72.122 kg) IBW/kg (Calculated) : 73  Temp (24hrs), Avg:98.1 F (36.7 C), Min:97.9 F (36.6 C), Max:98.2 F (36.8 C)   Recent Labs Lab 06/14/15 0854 06/14/15 1120 06/15/15 0401 06/16/15 0413  WBC 17.2*  --  10.3 9.2  CREATININE 0.96  --  0.81 0.79  LATICACIDVEN  --  1.2  --   --     Estimated Creatinine Clearance: 77.6 mL/min (by C-G formula based on Cr of 0.79).    Allergies  Allergen Reactions  . Penicillin G Hives  . Sulfa Antibiotics Rash    Antimicrobials this admission: cefepime 5/11 >> x 1 vancomycin 5/11 >> x 1 Ceftriaxone 5/11 >>  Dose adjustments this admission:   Microbiology results: 5/11 BCx: NGTD x 4 days Ucx: pending    Thank you for allowing pharmacy to be a part of this patient's care.  Loranzo Desha D 06/18/2015 8:32 AM

## 2015-06-18 NOTE — Care Management (Addendum)
[employer] [phone] and they are stating that patient cannot have home health PT and bill Medicare Part B for urinary cath supplies. Consolidated billing under Medicare-  [facility] is accepting patient under Part A.  No further RNCM needs.

## 2015-06-19 LAB — CULTURE, BLOOD (ROUTINE X 2)
CULTURE: NO GROWTH
Culture: NO GROWTH

## 2015-06-19 NOTE — Telephone Encounter (Signed)
Spoke with [name] in reference to f/u appt in August and [facility]. [name] voiced understanding and requested to call [facility] back.

## 2015-06-19 NOTE — Telephone Encounter (Signed)
They can decide based on how he's doing in August.  I would recommended continuing to straight cath.  Hollice Espy, MD

## 2015-06-25 ENCOUNTER — Other Ambulatory Visit: Payer: Self-pay

## 2015-06-25 DIAGNOSIS — R339 Retention of urine, unspecified: Secondary | ICD-10-CM

## 2015-06-25 MED ORDER — SILODOSIN 8 MG PO CAPS: 8.0000 mg | ORAL_CAPSULE | Freq: Every day | ORAL | Status: DC

## 2015-07-05 ENCOUNTER — Ambulatory Visit: Payer: Medicare Other

## 2015-09-07 ENCOUNTER — Ambulatory Visit (INDEPENDENT_AMBULATORY_CARE_PROVIDER_SITE_OTHER): Payer: Medicare Other | Admitting: Urology

## 2015-09-07 ENCOUNTER — Emergency Department
Admission: EM | Admit: 2015-09-07 | Discharge: 2015-09-07 | Disposition: A | Discharge status: Home / Self Care | Payer: Medicare Other | Attending: Emergency Medicine | Admitting: Emergency Medicine

## 2015-09-07 ENCOUNTER — Encounter: Payer: Self-pay | Admitting: Emergency Medicine

## 2015-09-07 ENCOUNTER — Emergency Department: Payer: Medicare Other

## 2015-09-07 VITALS — BP 106/63 | HR 46 | Ht 70.0 in | Wt 167.0 lb

## 2015-09-07 DIAGNOSIS — N390 Urinary tract infection, site not specified: Secondary | ICD-10-CM | POA: Diagnosis not present

## 2015-09-07 DIAGNOSIS — R000 Tachycardia, unspecified: Secondary | ICD-10-CM

## 2015-09-07 DIAGNOSIS — I5032 Chronic diastolic (congestive) heart failure: Secondary | ICD-10-CM | POA: Insufficient documentation

## 2015-09-07 DIAGNOSIS — E119 Type 2 diabetes mellitus without complications: Secondary | ICD-10-CM | POA: Insufficient documentation

## 2015-09-07 DIAGNOSIS — E039 Hypothyroidism, unspecified: Secondary | ICD-10-CM | POA: Diagnosis not present

## 2015-09-07 DIAGNOSIS — Z7982 Long term (current) use of aspirin: Secondary | ICD-10-CM | POA: Insufficient documentation

## 2015-09-07 DIAGNOSIS — Z85118 Personal history of other malignant neoplasm of bronchus and lung: Secondary | ICD-10-CM | POA: Insufficient documentation

## 2015-09-07 DIAGNOSIS — Z95810 Presence of automatic (implantable) cardiac defibrillator: Secondary | ICD-10-CM | POA: Diagnosis not present

## 2015-09-07 DIAGNOSIS — Z87891 Personal history of nicotine dependence: Secondary | ICD-10-CM | POA: Insufficient documentation

## 2015-09-07 DIAGNOSIS — R0602 Shortness of breath: Secondary | ICD-10-CM | POA: Diagnosis present

## 2015-09-07 LAB — BRAIN NATRIURETIC PEPTIDE: B NATRIURETIC PEPTIDE 5: 91 pg/mL (ref 0.0–100.0)

## 2015-09-07 LAB — BASIC METABOLIC PANEL
ANION GAP: 12 (ref 5–15)
BUN: 30 mg/dL — AB (ref 6–20)
CALCIUM: 9.7 mg/dL (ref 8.9–10.3)
CO2: 25 mmol/L (ref 22–32)
CREATININE: 1.02 mg/dL (ref 0.61–1.24)
Chloride: 103 mmol/L (ref 101–111)
GFR calc Af Amer: >60 mL/min (ref >60)
GFR calc non Af Amer: >60 mL/min (ref >60)
GLUCOSE: 144 mg/dL — AB (ref 65–99)
Potassium: 4.1 mmol/L (ref 3.5–5.1)
Sodium: 140 mmol/L (ref 135–145)

## 2015-09-07 LAB — URINALYSIS COMPLETE WITH MICROSCOPIC (ARMC ONLY)
BILIRUBIN URINE: NEGATIVE
GLUCOSE, UA: NEGATIVE mg/dL
KETONES UR: NEGATIVE mg/dL
NITRITE: NEGATIVE
Protein, ur: NEGATIVE mg/dL
SPECIFIC GRAVITY, URINE: 1.014 (ref 1.005–1.030)
pH: 5 (ref 5.0–8.0)

## 2015-09-07 LAB — TROPONIN I

## 2015-09-07 LAB — CBC
HCT: 43.4 % (ref 40.0–52.0)
HEMOGLOBIN: 14.4 g/dL (ref 13.0–18.0)
MCH: 30.2 pg (ref 26.0–34.0)
MCHC: 33.1 g/dL (ref 32.0–36.0)
MCV: 91.4 fL (ref 80.0–100.0)
Platelets: 203 10*3/uL (ref 150–440)
RBC: 4.75 MIL/uL (ref 4.40–5.90)
RDW: 17 % — AB (ref 11.5–14.5)
WBC: 12.4 10*3/uL — ABNORMAL HIGH (ref 3.8–10.6)

## 2015-09-07 LAB — MAGNESIUM: Magnesium: 1.6 mg/dL — ABNORMAL LOW (ref 1.7–2.4)

## 2015-09-07 MED ORDER — NITROFURANTOIN MONOHYD MACRO 100 MG PO CAPS: 100.0000 mg | ORAL_CAPSULE | Freq: Two times a day (BID) | ORAL | 0 refills | Status: AC

## 2015-09-07 MED ORDER — IOPAMIDOL (ISOVUE-370) INJECTION 76%
75.0000 mL | Freq: Once | INTRAVENOUS | Status: AC | PRN
  Administered 2015-09-07: 75 mL via INTRAVENOUS

## 2015-09-07 MED ORDER — NITROFURANTOIN MONOHYD MACRO 100 MG PO CAPS
100.0000 mg | ORAL_CAPSULE | Freq: Once | ORAL | Status: AC
Start: 2015-09-07 — End: 2015-09-07
  Administered 2015-09-07: 100 mg via ORAL
  Filled 2015-09-07: qty 1

## 2015-09-07 MED ORDER — METOPROLOL SUCCINATE ER 25 MG PO TB24: 25.0000 mg | ORAL_TABLET | Freq: Every day | ORAL | 1 refills | Status: DC

## 2015-09-07 NOTE — ED Notes (Signed)
Pt transported to CT ?

## 2015-09-07 NOTE — ED Triage Notes (Signed)
Pt comes into the ED via POV with family c/o possible atrial fibrillation.  Patient has a Investment banker, corporate.  Patient's HR earlier was jumping around from 40-120's.  Patient was sent her per request of Dr. Ubaldo Glassing.  Denies any N/V, chest pain.  Patient explains that he has more shortness of breath than his normal.  Patient able to communicate effectively.

## 2015-09-07 NOTE — ED Provider Notes (Signed)
[facility] Emergency Department Provider Note  ____________________________________________  Time seen: Approximately 3:58 PM  I have reviewed the triage vital signs and the nursing notes.   HISTORY  Chief Complaint Atrial Fibrillation   HPI Russell Howard is a 81 y.o. male with a history of sick sinus syndrome status post AICD, CHF, PE on ELiquis lung cancer, prostatic cardiac valve, and BPH who presents for evaluation of abnormal heart rhythm. Patient was here seeing his urologist and he was noted to be tachycardic with an abnormal pacing and was sent to the emergency department for evaluation. Patient reports chronic shortness of breath however fused for the last week has been more short of breath and normal. He denies chest pain. He endorses compliance with his blood thinners. He denies palpitations, chest pain, syncope, abdominal pain, nausea, vomiting, changes in his appetite, leg swelling. He is no longer on chemo or radiation for his lung cancer. Pacer was last interrogated 2 months ago when he was working properly. Patient has a possible scientific pacer and batteries were changed in 2014.  Past Medical History:  Diagnosis Date  . Acquired hypothyroidism 11/02/2014  . BPH (benign prostatic hyperplasia)   . Cardiac defibrillator in place 11/02/2014  . Cardiomyopathy ([place])   . CHF (congestive heart failure) ([place])   . Chronic diastolic heart failure ([place]) 11/02/2014  . Gastro-esophageal reflux disease without esophagitis 11/02/2014  . H/O malignant neoplasm 11/09/2014  . Heart valve disease 11/02/2014  . History of atrial fibrillation 11/02/2014  . Lung cancer ([place])   . Neuropathy ([place]) 11/02/2014  . Orthostasis 11/02/2014  . Pure hypercholesterolemia 11/02/2014  . Valvular heart disease     Patient Active Problem List   Diagnosis Date Noted  . Sepsis ([place]) 06/14/2015  . UTI (lower urinary tract infection) 06/14/2015    Class: Acute  . H/O malignant  neoplasm 11/09/2014  . Acquired hypothyroidism 11/02/2014  . Cardiomyopathy ([place]) 11/02/2014  . Chronic diastolic heart failure ([place]) 11/02/2014  . Diabetes mellitus without complication ([place]) 30/16/0109  . Cardiac defibrillator in place 11/02/2014  . Gastro-esophageal reflux disease without esophagitis 11/02/2014  . History of atrial fibrillation 11/02/2014  . BP (high blood pressure) 11/02/2014  . Neuropathy ([place]) 11/02/2014  . Orthostasis 11/02/2014  . Pure hypercholesterolemia 11/02/2014  . Heart valve disease 11/02/2014    Past Surgical History:  Procedure Laterality Date  . APPENDECTOMY    . DUAL ICD IMPLANT  2007/2009   implantable cardiac defibrillator  . PORT-A-CATH REMOVAL      Prior to Admission medications   Medication Sig Start Date End Date Taking? Authorizing Provider  acidophilus (RISAQUAD) CAPS capsule Take 1 capsule by mouth daily.    Historical Provider, MD  apixaban (ELIQUIS) 5 MG TABS tablet Take 1 tablet (5 mg total) by mouth 2 (two) times daily. 06/24/15   Epifanio Lesches, MD  Ascorbic Acid (VITAMIN C) 1000 MG tablet Take 500 mg by mouth 3 (three) times daily. Reported on 06/14/2015    Historical Provider, MD  aspirin EC 81 MG tablet Take 81 mg by mouth daily.     Historical Provider, MD  Cholecalciferol 5000 units TABS Take 5,000 Units by mouth every morning.    Historical Provider, MD  Coenzyme Q10 (TH CO Q-10) 100 MG capsule Take 100 mg by mouth daily.     Historical Provider, MD  digoxin (LANOXIN) 0.125 MG tablet Take 0.125 mg by mouth every morning.  05/10/15   Historical Provider, MD  furosemide (LASIX) 20 MG  tablet Take 20 mg by mouth every other day as needed (as needed for water retention).  05/10/15   Historical Provider, MD  gabapentin (NEURONTIN) 300 MG capsule Take 300 mg by mouth 3 (three) times daily.  12/20/14   Historical Provider, MD  Glucosamine-Chondroit-Vit C-Mn (GLUCOSAMINE-CHONDROITIN) CAPS Take 1 capsule by mouth 2 (two) times daily.      Historical Provider, MD  hydrocortisone (CORTEF) 10 MG tablet Take by mouth 2 (two) times daily.  04/10/15   Historical Provider, MD  levothyroxine (SYNTHROID, LEVOTHROID) 50 MCG tablet Take 50 mcg by mouth daily before breakfast.    Historical Provider, MD  methenamine (HIPREX) 1 g tablet Take 1 tablet (1 g total) by mouth 2 (two) times daily with a meal. 05/31/15   Hollice Espy, MD  pantoprazole (PROTONIX) 40 MG tablet Take 40 mg by mouth daily before breakfast.     Historical Provider, MD  potassium chloride (MICRO-K) 10 MEQ CR capsule Take 10 mEq by mouth every other day.     Historical Provider, MD  pyridostigmine (MESTINON) 60 MG tablet Take 60 mg by mouth 2 (two) times daily.  05/07/15   Historical Provider, MD  SAW PALMETTO-PUMPKIN SEED OIL PO Take 1 capsule by mouth daily.     Historical Provider, MD  silodosin (RAPAFLO) 8 MG CAPS capsule Take 1 capsule (8 mg total) by mouth daily with breakfast. 06/25/15   Nori Riis, PA-C  simvastatin (ZOCOR) 20 MG tablet Take 20 mg by mouth daily at 6 PM.     Historical Provider, MD  vitamin B-12 (CYANOCOBALAMIN) 500 MCG tablet Take 500 mcg by mouth daily.     Historical Provider, MD  vitamin E 400 UNIT capsule Take 400 Units by mouth daily.     Historical Provider, MD    Allergies Penicillin g and Sulfa antibiotics  Family History  Problem Relation Age of Onset  . Hypertension    . Heart disease      Social History Social History  Substance Use Topics  . Smoking status: Former Smoker    Types: Cigarettes    Quit date: 11/21/1981  . Smokeless tobacco: Never Used  . Alcohol use No    Review of Systems Constitutional: Negative for fever. Eyes: Negative for visual changes. ENT: Negative for sore throat. Cardiovascular: Negative for chest pain. Respiratory: + shortness of breath. Gastrointestinal: Negative for abdominal pain, vomiting or diarrhea. Genitourinary: Negative for dysuria. Musculoskeletal: Negative for back pain. Skin:  Negative for rash. Neurological: Negative for headaches, weakness or numbness.  ____________________________________________   PHYSICAL EXAM:  VITAL SIGNS: ED Triage Vitals [09/07/15 1512]  Enc Vitals Group     BP (!) 128/50     Pulse Rate 82     Resp (!) 24     Temp 97.6 F (36.4 C)     Temp Source Oral     SpO2 99 %     Weight 158 lb (71.7 kg)     Height '5\' 10"'$  (1.778 m)     Head Circumference      Peak Flow      Pain Score      Pain Loc      Pain Edu?      Excl. in [place]?     Constitutional: Alert and oriented. Well appearing and in no apparent distress. HEENT:      Head: Normocephalic and atraumatic.         Eyes: Conjunctivae are normal. Sclera is non-icteric. EOMI. PERRL  Mouth/Throat: Mucous membranes are moist.       Neck: Supple with no signs of meningismus. Cardiovascular: Regular rate and rhythm. No murmurs, gallops, or rubs. 2+ symmetrical distal pulses are present in all extremities. No JVD. Respiratory: Normal respiratory effort. Lungs are clear to auscultation bilaterally. Severely diminished breath sounds on the right  Gastrointestinal: Soft, non tender, and non distended with positive bowel sounds. No rebound or guarding. Genitourinary: No CVA tenderness. Musculoskeletal: Nontender with normal range of motion in all extremities. No edema, cyanosis, or erythema of extremities. Neurologic: Normal speech and language. Face is symmetric. Moving all extremities. No gross focal neurologic deficits are appreciated. Skin: Skin is warm, dry and intact. No rash noted. Psychiatric: Mood and affect are normal. Speech and behavior are normal.  ____________________________________________   LABS (all labs ordered are listed, but only abnormal results are displayed)  Labs Reviewed  BASIC METABOLIC PANEL - Abnormal; Notable for the following:       Result Value   Glucose, Bld 144 (*)    BUN 30 (*)    All other components within normal limits  CBC - Abnormal;  Notable for the following:    WBC 12.4 (*)    RDW 17.0 (*)    All other components within normal limits  MAGNESIUM - Abnormal; Notable for the following:    Magnesium 1.6 (*)    All other components within normal limits  URINALYSIS COMPLETEWITH MICROSCOPIC ([facility] ONLY) - Abnormal; Notable for the following:    Color, Urine YELLOW (*)    APPearance HAZY (*)    Hgb urine dipstick 1+ (*)    Leukocytes, UA 3+ (*)    Bacteria, UA RARE (*)    Squamous Epithelial / LPF 0-5 (*)    All other components within normal limits  URINE CULTURE  BRAIN NATRIURETIC PEPTIDE  TROPONIN I  TROPONIN I   ____________________________________________  EKG  ED ECG REPORT I, Rudene Re, the attending physician, personally viewed and interpreted this ECG.  Paced rhythm, with multiple pacer spikes not being transmitted, rate of 99, prolonged QTC, right bundle branch block, no ST elevations or depressions.  ____________________________________________  RADIOLOGY  CXR: Stable extensive airspace consolidation on the right pleural effusion and volume loss on the right, stable. Left lung clear. Stable cardiac silhouette. Pacemaker leads are unchanged and appear grossly intact. The overall appearance is stable compared to most recent prior studies.  CT chest:  ____________________________________________   PROCEDURES  Procedure(s) performed: None Procedures Critical Care performed:  Yes  CRITICAL CARE Performed by: Rudene Re  ?  Total critical care time: 45 min  Critical care time was exclusive of separately billable procedures and treating other patients.  Critical care was necessary to treat or prevent imminent or life-threatening deterioration.  Critical care was time spent personally by me on the following activities: development of treatment plan with patient and/or surrogate as well as nursing, discussions with consultants, evaluation of patient's response to treatment,  examination of patient, obtaining history from patient or surrogate, ordering and performing treatments and interventions, ordering and review of laboratory studies, ordering and review of radiographic studies, pulse oximetry and re-evaluation of patient's condition.  ____________________________________________   INITIAL IMPRESSION / ASSESSMENT AND PLAN / ED COURSE  81 y.o. male with a history of sick sinus syndrome status post AICD, CHF, PE on ELiquis lung cancer, prostatic cardiac valve, and BPH who presents for evaluation of abnormal heart rhythm. Patient is being paced at 120 bpm, normal blood pressure and  mentation. Will interrogate pacer, get labs. I will hold off on repeating a CT of the chest as we have an alternative diagnosis for patient's shortness of breath. Patient does report that his shortness of breath which is slightly more over the course of the last week but nothing that he would have come to the ED for evaluation. He reports that he is only here because his doctor today saw the abnormal HR in the monitor in the office and sent him here. Chest x-ray shows intact pacer leads. Will call [facility] for interrogation.  Clinical Course  Comment By Time  Spoke with Dr. Nehemiah Massed who recommended observing patient as long as his blood pressure mental status are within normal limits into the pacer is interrogated. We have a tach on his way to do that. Pacer pads were placed on the patient. Rudene Re, MD 08/04 [phone]  Pacer working properly. Atrial sensed ventricular paced. Patient is currently on atrial tachycardia. Does have leukocytosis. Troponins are pending. UA urine is pending. BNP is pending. We'll get a CTA to rule out PE. All these tests are being done to try to figure out the cause of patient being in tachycardia at this time. Rudene Re, MD 08/04 1817    _________________________ 7:53 PM on 09/07/2015 -----------------------------------------  Discuss  patient again with Dr. Nehemiah Massed who recommended pursuing other etiologies of sinus tachycardia. CT showing improvement of clot burden with no new clots and no new findings. UA concerning for UTI with mild leukocytosis, no fever, no symptoms. Patient self caths and has no symptoms of UTI and history of colonization. Prior UA grew MRSA and Enterococcus both sensitive to macrobid. Patient's kidney function is normal, will start on macrobid. His HR has normalized with no intervention and is currently between 80s and mid 90s. Patient remains completely asymptomatic and tells me that the only reason why he is here is because his doctor saw his rhythm on the cardiac monitor earlier today and sent him for evaluation. Troponin 2 is negative. I offered admission for IVF, antibiotics for UTI and repeat labs in the morning however patient wishes to go home. His son who lives with patient is comfortable taking him home. He reports that he will bring patient back if he develops any symptoms. I encouraged increase by mouth hydration due to the load of contrast and a UTI for the next few days. They will call patient's primary care doctor on Monday for close follow-up. Son will bring patient back if he develops fever, nausea, vomiting, abdominal pain, chest pain, dizziness, or any other symptoms.  Discussed patient again with Dr. Nehemiah Massed who will provide patient close f/u Monday with Dr. Ubaldo Glassing. He also recommended starting patient on toprol '25mg'$  daily as patient is on no beta blockade. Will provide prescription for that.   Pertinent labs & imaging results that were available during my care of the patient were reviewed by me and considered in my medical decision making (see chart for details).    ____________________________________________   FINAL CLINICAL IMPRESSION(S) / ED DIAGNOSES  Final diagnoses:  Sinus tachycardia ([place])  UTI (lower urinary tract infection)      NEW MEDICATIONS STARTED DURING THIS  VISIT:  New Prescriptions   No medications on file     Note:  This document was prepared using Dragon voice recognition software and may include unintentional dictation errors.    Rudene Re, MD 09/07/15 2004

## 2015-09-07 NOTE — Progress Notes (Signed)
While getting patient's vitals patient's pulse was repeated multiple times, pulse was not consistent an indication for possible a-Fib. Patient states he does not feel well today and is dizzy/ hazy.  Patient states he has a pacemaker in place that should keep his pulse around 70 per the rep that did an adjustment in June and he should contact Cardio if pulse is abnormal. Contacted Dr. Bethanne Ginger office and spoke with his nurse and notified her of the patient's situation and she stated that Dr. Ubaldo Glassing was not in the office and to send the patient to the ER for further evaluation. Patient was given this instruction and left the office to go to the ER.  Plan to reschedule Urology visit for later date.    Hollice Espy, MD

## 2015-09-07 NOTE — ED Notes (Signed)
X-ray at bedside

## 2015-09-07 NOTE — ED Notes (Addendum)
Joey, from [employer] at [employer].

## 2015-09-07 NOTE — ED Notes (Signed)
Called [employer] to send rep to interrogate pts pacemaker.

## 2015-09-07 NOTE — Discharge Instructions (Signed)
Take your antibiotics as prescribed. Start taking metoprolol as prescribed. Call Dr. Bethanne Ginger office on Monday morning for close follow-up. Also call your primary care doctor Monday for follow-up. Return to the emergency department if you have chest pain, if you're like her going to pass out, shortness of breath, abdominal, fever, or any other symptoms concerning to you.

## 2015-09-10 ENCOUNTER — Other Ambulatory Visit: Payer: Self-pay | Admitting: Urology

## 2015-09-10 DIAGNOSIS — N390 Urinary tract infection, site not specified: Secondary | ICD-10-CM

## 2015-09-10 LAB — URINE CULTURE: Culture: >=100000 — AB

## 2015-09-12 ENCOUNTER — Ambulatory Visit (INDEPENDENT_AMBULATORY_CARE_PROVIDER_SITE_OTHER): Payer: Medicare Other | Admitting: Urology

## 2015-09-12 ENCOUNTER — Encounter: Payer: Self-pay | Admitting: Urology

## 2015-09-12 VITALS — BP 99/63 | HR 93 | Ht 70.0 in | Wt 168.0 lb

## 2015-09-12 DIAGNOSIS — N390 Urinary tract infection, site not specified: Secondary | ICD-10-CM | POA: Diagnosis not present

## 2015-09-12 DIAGNOSIS — Z87448 Personal history of other diseases of urinary system: Secondary | ICD-10-CM | POA: Diagnosis not present

## 2015-09-12 DIAGNOSIS — R339 Retention of urine, unspecified: Secondary | ICD-10-CM | POA: Diagnosis not present

## 2015-09-12 NOTE — Progress Notes (Signed)
09/12/2015 2:27 PM   Russell Howard Jun 30, 1934 [id number]  Referring provider: [name], MD [facility] [place], [place] [address]  Chief Complaint  Patient presents with  . Recurrent UTI    HPI: 81 year old male with stage III lung cancer, urinary retention on clean intermittent catheterization, and recurrent urinary tract infections.  Urinary retention Patient has been successful clean intermittent catheterization since his failed void trial He has been successful at catheterizing himself twice daily with excellent technique Void intermittently throughout the day but has fairly significant post void residual with each catheterization Overall, no significant urinary complaints today. Nocturia 1.  History of urethral stricture S/p in office urethral dilation by Dr. Elnoria Howard 11/2014 with filiform and followers  Recurrent UTIs Reports being treated proximally once per month over the past 6 months for urinary tract infection. With each infection, he has generalized malaise, fatigue, and feels just "crappy".  Generally, he is had no significant associated urinary symptoms with the infection other than on 1 single occasion (urinary frequency/lower abdominal pressure).    He was recently he was In the emergency room with palpitations and an irregular heartbeat. At that time, for unclear reasons this urine was checked which grew coag negative staph. Despite being asymptomatic, he was treated for urinary tract infection as they felt that this possibly could be contributing to his cardiac symptoms.  He was started on Macrobid and continues this antibiotic today.  Currently on probiotic and hipprex for UTI prevention..    Today, he denies any UTI symptoms.   PMH: Past Medical History:  Diagnosis Date  . Acquired hypothyroidism 11/02/2014  . BPH (benign prostatic hyperplasia)   . Cardiac defibrillator in place 11/02/2014  . Cardiomyopathy ([place])   . CHF  (congestive heart failure) ([place])   . Chronic diastolic heart failure ([place]) 11/02/2014  . Gastro-esophageal reflux disease without esophagitis 11/02/2014  . H/O malignant neoplasm 11/09/2014  . Heart valve disease 11/02/2014  . History of atrial fibrillation 11/02/2014  . Lung cancer ([place])   . Neuropathy ([place]) 11/02/2014  . Orthostasis 11/02/2014  . Pure hypercholesterolemia 11/02/2014  . Valvular heart disease     Surgical History: Past Surgical History:  Procedure Laterality Date  . APPENDECTOMY    . DUAL ICD IMPLANT  2007/2009   implantable cardiac defibrillator  . PORT-A-CATH REMOVAL      Home Medications:    Medication List       Accurate as of 09/12/15 11:59 PM. Always use your most recent med list.          acidophilus Caps capsule Take 1 capsule by mouth daily.   apixaban 5 MG Tabs tablet Commonly known as:  ELIQUIS Take 1 tablet (5 mg total) by mouth 2 (two) times daily.   aspirin EC 81 MG tablet Take 81 mg by mouth daily.   Cholecalciferol 5000 units Tabs Take 5,000 Units by mouth every morning.   digoxin 0.125 MG tablet Commonly known as:  LANOXIN Take 0.125 mg by mouth every morning.   furosemide 20 MG tablet Commonly known as:  LASIX Take 20 mg by mouth every other day as needed (as needed for water retention).   gabapentin 300 MG capsule Commonly known as:  NEURONTIN Take 300 mg by mouth 3 (three) times daily.   Glucosamine-Chondroitin Caps Take 1 capsule by mouth 2 (two) times daily.   hydrocortisone 10 MG tablet Commonly known as:  CORTEF Take by mouth 2 (two) times daily.   levothyroxine  50 MCG tablet Commonly known as:  SYNTHROID, LEVOTHROID Take 50 mcg by mouth daily before breakfast.   methenamine 1 g tablet Commonly known as:  HIPREX TAKE 1 TABLET(1 GRAM) BY MOUTH TWICE DAILY WITH A MEAL   metoprolol succinate 25 MG 24 hr tablet Commonly known as:  TOPROL XL Take 1 tablet (25 mg total) by mouth daily.   nitrofurantoin  (macrocrystal-monohydrate) 100 MG capsule Commonly known as:  MACROBID Take 1 capsule (100 mg total) by mouth 2 (two) times daily.   pantoprazole 40 MG tablet Commonly known as:  PROTONIX Take 40 mg by mouth daily before breakfast.   potassium chloride 10 MEQ CR capsule Commonly known as:  MICRO-K Take 10 mEq by mouth every other day.   pyridostigmine 60 MG tablet Commonly known as:  MESTINON Take 60 mg by mouth 2 (two) times daily.   SAW PALMETTO-PUMPKIN SEED OIL PO Take 1 capsule by mouth daily.   silodosin 8 MG Caps capsule Commonly known as:  RAPAFLO Take 1 capsule (8 mg total) by mouth daily with breakfast.   simvastatin 20 MG tablet Commonly known as:  ZOCOR Take 20 mg by mouth daily at 6 PM.   TH CO Q-10 100 MG capsule Generic drug:  Coenzyme Q10 Take 100 mg by mouth daily.   vitamin B-12 500 MCG tablet Commonly known as:  CYANOCOBALAMIN Take 500 mcg by mouth daily.   vitamin C 1000 MG tablet Take 500 mg by mouth 3 (three) times daily. Reported on 06/14/2015   vitamin E 400 UNIT capsule Take 400 Units by mouth daily.       Allergies:  Allergies  Allergen Reactions  . Penicillin G Hives  . Sulfa Antibiotics Rash    Family History: Family History  Problem Relation Age of Onset  . Hypertension    . Heart disease      Social History:  reports that he quit smoking about 33 years ago. His smoking use included Cigarettes. He has never used smokeless tobacco. He reports that he does not drink alcohol or use drugs.  ROS: UROLOGY Frequent Urination?: No Hard to postpone urination?: No Burning/pain with urination?: No Get up at night to urinate?: No Leakage of urine?: No Urine stream starts and stops?: No Trouble starting stream?: No Do you have to strain to urinate?: No Blood in urine?: No Urinary tract infection?: No Sexually transmitted disease?: No Injury to kidneys or bladder?: No Painful intercourse?: No Weak stream?: No Erection problems?:  No Penile pain?: No  Gastrointestinal Nausea?: No Vomiting?: No Indigestion/heartburn?: No Diarrhea?: No Constipation?: No  Constitutional Fever: No Night sweats?: No Weight loss?: No Fatigue?: No  Skin Skin rash/lesions?: No Itching?: No  Eyes Blurred vision?: No Double vision?: No  Ears/Nose/Throat Sore throat?: No Sinus problems?: No  Hematologic/Lymphatic Swollen glands?: No Easy bruising?: No  Cardiovascular Leg swelling?: No Chest pain?: No  Respiratory Cough?: No Shortness of breath?: No  Endocrine Excessive thirst?: No  Musculoskeletal Back pain?: No Joint pain?: No  Neurological Headaches?: No Dizziness?: Yes  Psychologic Depression?: No Anxiety?: No  Physical Exam: BP 99/63   Pulse 93   Ht '5\' 10"'$  (1.778 m)   Wt 168 lb (76.2 kg)   BMI 24.11 kg/m   Constitutional:  Alert and oriented, No acute distress.  Presents with daughter.  In wheelchair. HEENT: [place] AT, moist mucus membranes.  Trachea midline, no masses. Cardiovascular: No clubbing, cyanosis, or edema. Respiratory: Normal respiratory effort, no increased work of breathing.  Skin:  No rashes, bruises or suspicious lesions. Neurologic: Grossly intact, no focal deficits, moving all 4 extremities. Psychiatric: Normal mood and affect.  Laboratory Data: Lab Results  Component Value Date   WBC 12.4 (H) 09/07/2015   HGB 14.4 09/07/2015   HCT 43.4 09/07/2015   MCV 91.4 09/07/2015   PLT 203 09/07/2015    Lab Results  Component Value Date   CREATININE 1.02 09/07/2015   Urinalysis Results for orders placed or performed during the hospital encounter of 09/07/15  Urine culture  Result Value Ref Range   Specimen Description URINE, RANDOM    Special Requests NONE    Culture (A)     >=100,000 COLONIES/mL STAPHYLOCOCCUS SPECIES (COAGULASE NEGATIVE)   Report Status 09/10/2015 FINAL    Organism ID, Bacteria STAPHYLOCOCCUS SPECIES (COAGULASE NEGATIVE) (A)       Susceptibility    Staphylococcus species (coagulase negative) - MIC*    CIPROFLOXACIN >=8 RESISTANT Resistant     GENTAMICIN 4 SENSITIVE Sensitive     NITROFURANTOIN <=16 SENSITIVE Sensitive     OXACILLIN Value in next row Resistant      >=4 RESISTANTWARNING: For oxacillin-resistant S.aureus and coagulase-negative staphylococci (MRS), other beta-lactam agents, ie, penicillins, beta-lactam/beta-lactamase inhibitor combinations, cephems (with the exception of the cephalosporins with anti-MRSA activity), and carbapenems, may appear active in vitro, but are not effective clinically.  --CLSI, Vol.32 No.3, January 2012, pg 70.    TETRACYCLINE Value in next row Sensitive      >=4 RESISTANTWARNING: For oxacillin-resistant S.aureus and coagulase-negative staphylococci (MRS), other beta-lactam agents, ie, penicillins, beta-lactam/beta-lactamase inhibitor combinations, cephems (with the exception of the cephalosporins with anti-MRSA activity), and carbapenems, may appear active in vitro, but are not effective clinically.  --CLSI, Vol.32 No.3, January 2012, pg 70.    VANCOMYCIN Value in next row Sensitive      >=4 RESISTANTWARNING: For oxacillin-resistant S.aureus and coagulase-negative staphylococci (MRS), other beta-lactam agents, ie, penicillins, beta-lactam/beta-lactamase inhibitor combinations, cephems (with the exception of the cephalosporins with anti-MRSA activity), and carbapenems, may appear active in vitro, but are not effective clinically.  --CLSI, Vol.32 No.3, January 2012, pg 70.    TRIMETH/SULFA Value in next row Resistant      >=4 RESISTANTWARNING: For oxacillin-resistant S.aureus and coagulase-negative staphylococci (MRS), other beta-lactam agents, ie, penicillins, beta-lactam/beta-lactamase inhibitor combinations, cephems (with the exception of the cephalosporins with anti-MRSA activity), and carbapenems, may appear active in vitro, but are not effective clinically.  --CLSI, Vol.32 No.3, January 2012, pg 70.     CLINDAMYCIN Value in next row Resistant      >=4 RESISTANTWARNING: For oxacillin-resistant S.aureus and coagulase-negative staphylococci (MRS), other beta-lactam agents, ie, penicillins, beta-lactam/beta-lactamase inhibitor combinations, cephems (with the exception of the cephalosporins with anti-MRSA activity), and carbapenems, may appear active in vitro, but are not effective clinically.  --CLSI, Vol.32 No.3, January 2012, pg 70.    RIFAMPIN Value in next row Sensitive      >=4 RESISTANTWARNING: For oxacillin-resistant S.aureus and coagulase-negative staphylococci (MRS), other beta-lactam agents, ie, penicillins, beta-lactam/beta-lactamase inhibitor combinations, cephems (with the exception of the cephalosporins with anti-MRSA activity), and carbapenems, may appear active in vitro, but are not effective clinically.  --CLSI, Vol.32 No.3, January 2012, pg 70.    * >=100,000 COLONIES/mL STAPHYLOCOCCUS SPECIES (COAGULASE NEGATIVE)  Basic metabolic panel  Result Value Ref Range   Sodium 140 135 - 145 mmol/L   Potassium 4.1 3.5 - 5.1 mmol/L   Chloride 103 101 - 111 mmol/L   CO2 25 22 - 32 mmol/L   Glucose, Bld 144 (  H) 65 - 99 mg/dL   BUN 30 (H) 6 - 20 mg/dL   Creatinine, Ser 1.02 0.61 - 1.24 mg/dL   Calcium 9.7 8.9 - 10.3 mg/dL   GFR calc non Af Amer >60 >60 mL/min   GFR calc Af Amer >60 >60 mL/min   Anion gap 12 5 - 15  CBC  Result Value Ref Range   WBC 12.4 (H) 3.8 - 10.6 K/uL   RBC 4.75 4.40 - 5.90 MIL/uL   Hemoglobin 14.4 13.0 - 18.0 g/dL   HCT 43.4 40.0 - 52.0 %   MCV 91.4 80.0 - 100.0 fL   MCH 30.2 26.0 - 34.0 pg   MCHC 33.1 32.0 - 36.0 g/dL   RDW 17.0 (H) 11.5 - 14.5 %   Platelets 203 150 - 440 K/uL  Magnesium  Result Value Ref Range   Magnesium 1.6 (L) 1.7 - 2.4 mg/dL  Brain natriuretic peptide  Result Value Ref Range   B Natriuretic Peptide 91.0 0.0 - 100.0 pg/mL  Troponin I  Result Value Ref Range   Troponin I <0.03 <0.03 ng/mL  Troponin I  Result Value Ref Range    Troponin I <0.03 <0.03 ng/mL  Urinalysis complete, with microscopic ([facility] only)  Result Value Ref Range   Color, Urine YELLOW (A) YELLOW   APPearance HAZY (A) CLEAR   Glucose, UA NEGATIVE NEGATIVE mg/dL   Bilirubin Urine NEGATIVE NEGATIVE   Ketones, ur NEGATIVE NEGATIVE mg/dL   Specific Gravity, Urine 1.014 1.005 - 1.030   Hgb urine dipstick 1+ (A) NEGATIVE   pH 5.0 5.0 - 8.0   Protein, ur NEGATIVE NEGATIVE mg/dL   Nitrite NEGATIVE NEGATIVE   Leukocytes, UA 3+ (A) NEGATIVE   RBC / HPF 0-5 0 - 5 RBC/hpf   WBC, UA TOO NUMEROUS TO COUNT 0 - 5 WBC/hpf   Bacteria, UA RARE (A) NONE SEEN   Squamous Epithelial / LPF 0-5 (A) NONE SEEN    Assessment & Plan:    1. Recurrent UTI Continue methenamine for urinary acidification and probiotic Reviewed importance of antibiotic stewardship and only treating ONLY if symptomatic. Return to our office if has signs or symptoms of infection for UA/ UCx (catheterized)  2. Urinary retention Continue CIC bid  3. History of urethral stricture As above, CICI to keep urethral patient   Return in about 6 months (around 03/14/2016) for recheck recurrent UTIs.  Hollice Espy, MD  [facility] [address], [place], [place] [address] [phone]

## 2015-10-04 ENCOUNTER — Telehealth: Payer: Self-pay

## 2015-10-04 NOTE — Telephone Encounter (Signed)
Totally fine to stop medication.    Please note that he may have more trouble emptying his bladder and need to cath three times daily rather than two if this happens.    Hollice Espy, MD

## 2015-10-04 NOTE — Telephone Encounter (Signed)
Pt daughter inlaw, [name], called stating pt went to see PCP today and pt BP was low. [name] stated PCP requested rapaflo be d/c. Reinforced with [place] Blood can cause low BP. Made [name] aware would get advise from Dr. Erlene Quan if medication can be d/c. Please advise.

## 2015-10-05 NOTE — Telephone Encounter (Signed)
Spoke with [name], pt daughter in law, in reference to rapaflo. Made aware Dr. Erlene Quan stated medication could be stopped but pt may have trouble emptying his bladder. At which point he would need to cath 3x daily instead of 2x. [name] voiced understanding of whole conversation.

## 2015-10-29 ENCOUNTER — Other Ambulatory Visit: Payer: Self-pay | Admitting: Urology

## 2015-10-29 DIAGNOSIS — N390 Urinary tract infection, site not specified: Secondary | ICD-10-CM

## 2015-11-08 ENCOUNTER — Emergency Department: Payer: Medicare Other

## 2015-11-08 ENCOUNTER — Inpatient Hospital Stay
Admission: EM | Admit: 2015-11-08 | Discharge: 2015-11-12 | DRG: 871 | Disposition: A | Discharge status: Home / Self Care | Payer: Medicare Other | Attending: Internal Medicine | Admitting: Internal Medicine

## 2015-11-08 ENCOUNTER — Encounter: Payer: Self-pay | Admitting: Emergency Medicine

## 2015-11-08 DIAGNOSIS — J90 Pleural effusion, not elsewhere classified: Secondary | ICD-10-CM

## 2015-11-08 DIAGNOSIS — Z87891 Personal history of nicotine dependence: Secondary | ICD-10-CM

## 2015-11-08 DIAGNOSIS — N390 Urinary tract infection, site not specified: Secondary | ICD-10-CM | POA: Diagnosis present

## 2015-11-08 DIAGNOSIS — E1140 Type 2 diabetes mellitus with diabetic neuropathy, unspecified: Secondary | ICD-10-CM | POA: Diagnosis present

## 2015-11-08 DIAGNOSIS — A412 Sepsis due to unspecified staphylococcus: Secondary | ICD-10-CM | POA: Diagnosis not present

## 2015-11-08 DIAGNOSIS — I482 Chronic atrial fibrillation: Secondary | ICD-10-CM | POA: Diagnosis present

## 2015-11-08 DIAGNOSIS — I5032 Chronic diastolic (congestive) heart failure: Secondary | ICD-10-CM | POA: Diagnosis present

## 2015-11-08 DIAGNOSIS — Z79899 Other long term (current) drug therapy: Secondary | ICD-10-CM

## 2015-11-08 DIAGNOSIS — K219 Gastro-esophageal reflux disease without esophagitis: Secondary | ICD-10-CM | POA: Diagnosis present

## 2015-11-08 DIAGNOSIS — E7800 Pure hypercholesterolemia, unspecified: Secondary | ICD-10-CM | POA: Diagnosis present

## 2015-11-08 DIAGNOSIS — I110 Hypertensive heart disease with heart failure: Secondary | ICD-10-CM | POA: Diagnosis present

## 2015-11-08 DIAGNOSIS — Z66 Do not resuscitate: Secondary | ICD-10-CM | POA: Diagnosis not present

## 2015-11-08 DIAGNOSIS — E871 Hypo-osmolality and hyponatremia: Secondary | ICD-10-CM | POA: Diagnosis present

## 2015-11-08 DIAGNOSIS — Z95810 Presence of automatic (implantable) cardiac defibrillator: Secondary | ICD-10-CM

## 2015-11-08 DIAGNOSIS — Z85118 Personal history of other malignant neoplasm of bronchus and lung: Secondary | ICD-10-CM

## 2015-11-08 DIAGNOSIS — Z7982 Long term (current) use of aspirin: Secondary | ICD-10-CM

## 2015-11-08 DIAGNOSIS — Z7901 Long term (current) use of anticoagulants: Secondary | ICD-10-CM

## 2015-11-08 DIAGNOSIS — Z8249 Family history of ischemic heart disease and other diseases of the circulatory system: Secondary | ICD-10-CM

## 2015-11-08 DIAGNOSIS — Z23 Encounter for immunization: Secondary | ICD-10-CM

## 2015-11-08 DIAGNOSIS — J189 Pneumonia, unspecified organism: Secondary | ICD-10-CM | POA: Diagnosis present

## 2015-11-08 DIAGNOSIS — G7000 Myasthenia gravis without (acute) exacerbation: Secondary | ICD-10-CM | POA: Diagnosis present

## 2015-11-08 DIAGNOSIS — A419 Sepsis, unspecified organism: Secondary | ICD-10-CM | POA: Diagnosis not present

## 2015-11-08 DIAGNOSIS — R0609 Other forms of dyspnea: Secondary | ICD-10-CM | POA: Diagnosis present

## 2015-11-08 DIAGNOSIS — N400 Enlarged prostate without lower urinary tract symptoms: Secondary | ICD-10-CM | POA: Diagnosis present

## 2015-11-08 DIAGNOSIS — Z882 Allergy status to sulfonamides status: Secondary | ICD-10-CM

## 2015-11-08 DIAGNOSIS — I429 Cardiomyopathy, unspecified: Secondary | ICD-10-CM | POA: Diagnosis present

## 2015-11-08 DIAGNOSIS — E039 Hypothyroidism, unspecified: Secondary | ICD-10-CM | POA: Diagnosis present

## 2015-11-08 DIAGNOSIS — Z880 Allergy status to penicillin: Secondary | ICD-10-CM

## 2015-11-08 LAB — CBC
HEMATOCRIT: 44.7 % (ref 40.0–52.0)
HEMOGLOBIN: 14.4 g/dL (ref 13.0–18.0)
MCH: 29.3 pg (ref 26.0–34.0)
MCHC: 32.2 g/dL (ref 32.0–36.0)
MCV: 91.2 fL (ref 80.0–100.0)
Platelets: 211 10*3/uL (ref 150–440)
RBC: 4.91 MIL/uL (ref 4.40–5.90)
RDW: 15.9 % — ABNORMAL HIGH (ref 11.5–14.5)
WBC: 12.3 10*3/uL — AB (ref 3.8–10.6)

## 2015-11-08 LAB — BASIC METABOLIC PANEL
Anion gap: 7 (ref 5–15)
BUN: 20 mg/dL (ref 6–20)
CHLORIDE: 102 mmol/L (ref 101–111)
CO2: 28 mmol/L (ref 22–32)
Calcium: 9.5 mg/dL (ref 8.9–10.3)
Creatinine, Ser: 1.04 mg/dL (ref 0.61–1.24)
GFR calc Af Amer: >60 mL/min (ref >60)
GFR calc non Af Amer: >60 mL/min (ref >60)
Glucose, Bld: 111 mg/dL — ABNORMAL HIGH (ref 65–99)
POTASSIUM: 4.7 mmol/L (ref 3.5–5.1)
SODIUM: 137 mmol/L (ref 135–145)

## 2015-11-08 LAB — TROPONIN I: Troponin I: <0.03 ng/mL (ref <0.03)

## 2015-11-08 LAB — LACTIC ACID, PLASMA: LACTIC ACID, VENOUS: 2.2 mmol/L — AB (ref 0.5–1.9)

## 2015-11-08 MED ORDER — SODIUM CHLORIDE 0.9 % IV BOLUS (SEPSIS)
1000.0000 mL | Freq: Once | INTRAVENOUS | Status: AC
  Administered 2015-11-08: 1000 mL via INTRAVENOUS

## 2015-11-08 MED ORDER — VANCOMYCIN HCL IN DEXTROSE 1-5 GM/200ML-% IV SOLN
INTRAVENOUS | Status: AC
  Administered 2015-11-08: 1000 mg via INTRAVENOUS
  Filled 2015-11-08: qty 200

## 2015-11-08 MED ORDER — DEXTROSE 5 % IV SOLN
2.0000 g | Freq: Once | INTRAVENOUS | Status: AC
  Administered 2015-11-09: 2 g via INTRAVENOUS
  Filled 2015-11-08: qty 2

## 2015-11-08 MED ORDER — VANCOMYCIN HCL IN DEXTROSE 1-5 GM/200ML-% IV SOLN
1000.0000 mg | Freq: Once | INTRAVENOUS | Status: AC
  Administered 2015-11-08: 1000 mg via INTRAVENOUS
  Filled 2015-11-08: qty 200

## 2015-11-08 NOTE — ED Notes (Signed)
Delay in lactic acid and blood cultures due to difficult stick; third RN in pt room at this time to attempt ultrasound IV.

## 2015-11-08 NOTE — ED Notes (Signed)
Pt alert.  Waiting in wheelchair.  Family with pt.

## 2015-11-08 NOTE — ED Notes (Signed)
Pt in via triage; pt daughter reports being sent over from [facility] due to acute bronchitis and sinusitis, suggesting IV antibiotics and fluids.  Pt with head cold x approximately one week and hemoptysis x 1 day.  Pt w/ hx of lung cancer.  Pt A/Ox4, tachycardic and tachypneic upon arrival into room.  Pt denies any pain at this time.  MD notified of pt condition.

## 2015-11-08 NOTE — ED Triage Notes (Signed)
Had gone to walk in clinic today not feeling well with sinus congestion and coughing.  Today noticed blood when coughing.  At clinic WBC elevated and chest xray looked worse than before, so sent patient to ED to be seen.

## 2015-11-08 NOTE — ED Provider Notes (Signed)
[facility] Emergency Department Provider Note    ____________________________________________   I have reviewed the triage vital signs and the nursing notes.   HISTORY  Chief Complaint Cough and Hemoptysis   History limited by: Not Limited   HPI Russell Howard is a 81 y.o. male who presents to the emergency department today from [place] clinic because of concerns for need for IV antibiotics.The patient went to urgent care today because of concerns for sinusitis and cold with congestion. He has had cough with some blood in it. Patient has a history of lung cancer so is somewhat shortness of breath at baseline. They have not appreciated any recent fevers.   Past Medical History:  Diagnosis Date  . Acquired hypothyroidism 11/02/2014  . BPH (benign prostatic hyperplasia)   . Cardiac defibrillator in place 11/02/2014  . Cardiomyopathy ([place])   . CHF (congestive heart failure) ([place])   . Chronic diastolic heart failure ([place]) 11/02/2014  . Gastro-esophageal reflux disease without esophagitis 11/02/2014  . H/O malignant neoplasm 11/09/2014  . Heart valve disease 11/02/2014  . History of atrial fibrillation 11/02/2014  . Lung cancer ([place])   . Neuropathy ([place]) 11/02/2014  . Orthostasis 11/02/2014  . Pure hypercholesterolemia 11/02/2014  . Valvular heart disease     Patient Active Problem List   Diagnosis Date Noted  . Sepsis ([place]) 06/14/2015  . UTI (lower urinary tract infection) 06/14/2015    Class: Acute  . H/O malignant neoplasm 11/09/2014  . Acquired hypothyroidism 11/02/2014  . Cardiomyopathy ([place]) 11/02/2014  . Chronic diastolic heart failure ([place]) 11/02/2014  . Diabetes mellitus without complication ([place]) 25/95/6387  . Cardiac defibrillator in place 11/02/2014  . Gastro-esophageal reflux disease without esophagitis 11/02/2014  . History of atrial fibrillation 11/02/2014  . BP (high blood pressure) 11/02/2014  . Neuropathy ([place]) 11/02/2014  . Orthostasis  11/02/2014  . Pure hypercholesterolemia 11/02/2014  . Heart valve disease 11/02/2014    Past Surgical History:  Procedure Laterality Date  . APPENDECTOMY    . DUAL ICD IMPLANT  2007/2009   implantable cardiac defibrillator  . PORT-A-CATH REMOVAL      Prior to Admission medications   Medication Sig Start Date End Date Taking? Authorizing Provider  acidophilus (RISAQUAD) CAPS capsule Take 1 capsule by mouth daily.    Historical Provider, MD  apixaban (ELIQUIS) 5 MG TABS tablet Take 1 tablet (5 mg total) by mouth 2 (two) times daily. 06/24/15   Epifanio Lesches, MD  Ascorbic Acid (VITAMIN C) 1000 MG tablet Take 500 mg by mouth 3 (three) times daily. Reported on 06/14/2015    Historical Provider, MD  aspirin EC 81 MG tablet Take 81 mg by mouth daily.     Historical Provider, MD  Cholecalciferol 5000 units TABS Take 5,000 Units by mouth every morning.    Historical Provider, MD  Coenzyme Q10 (TH CO Q-10) 100 MG capsule Take 100 mg by mouth daily.     Historical Provider, MD  digoxin (LANOXIN) 0.125 MG tablet Take 0.125 mg by mouth every morning.  05/10/15   Historical Provider, MD  furosemide (LASIX) 20 MG tablet Take 20 mg by mouth every other day as needed (as needed for water retention).  05/10/15   Historical Provider, MD  gabapentin (NEURONTIN) 300 MG capsule Take 300 mg by mouth 3 (three) times daily.  12/20/14   Historical Provider, MD  Glucosamine-Chondroit-Vit C-Mn (GLUCOSAMINE-CHONDROITIN) CAPS Take 1 capsule by mouth 2 (two) times daily.     Historical Provider, MD  hydrocortisone (CORTEF)  10 MG tablet Take by mouth 2 (two) times daily.  04/10/15   Historical Provider, MD  levothyroxine (SYNTHROID, LEVOTHROID) 50 MCG tablet Take 50 mcg by mouth daily before breakfast.    Historical Provider, MD  methenamine (HIPREX) 1 g tablet TAKE 1 TABLET(1 GRAM) BY MOUTH TWICE DAILY WITH A MEAL 10/29/15   Larene Beach A McGowan, PA-C  metoprolol succinate (TOPROL XL) 25 MG 24 hr tablet Take 1 tablet (25 mg  total) by mouth daily. 09/07/15 09/06/16  Rudene Re, MD  pantoprazole (PROTONIX) 40 MG tablet Take 40 mg by mouth daily before breakfast.     Historical Provider, MD  potassium chloride (MICRO-K) 10 MEQ CR capsule Take 10 mEq by mouth every other day.     Historical Provider, MD  pyridostigmine (MESTINON) 60 MG tablet Take 60 mg by mouth 2 (two) times daily.  05/07/15   Historical Provider, MD  SAW PALMETTO-PUMPKIN SEED OIL PO Take 1 capsule by mouth daily.     Historical Provider, MD  silodosin (RAPAFLO) 8 MG CAPS capsule Take 1 capsule (8 mg total) by mouth daily with breakfast. 06/25/15   Nori Riis, PA-C  simvastatin (ZOCOR) 20 MG tablet Take 20 mg by mouth daily at 6 PM.     Historical Provider, MD  vitamin B-12 (CYANOCOBALAMIN) 500 MCG tablet Take 500 mcg by mouth daily.     Historical Provider, MD  vitamin E 400 UNIT capsule Take 400 Units by mouth daily.     Historical Provider, MD    Allergies Penicillin g and Sulfa antibiotics  Family History  Problem Relation Age of Onset  . Hypertension    . Heart disease      Social History Social History  Substance Use Topics  . Smoking status: Former Smoker    Types: Cigarettes    Quit date: 11/21/1981  . Smokeless tobacco: Never Used  . Alcohol use No    Review of Systems  Constitutional: Negative for fever. Cardiovascular: Negative for chest pain. Respiratory: Positive for shortness of breath. Gastrointestinal: Negative for abdominal pain, vomiting and diarrhea. Neurological: Negative for headaches, focal weakness or numbness.   10-point ROS otherwise negative.  ____________________________________________   PHYSICAL EXAM:  VITAL SIGNS: ED Triage Vitals  Enc Vitals Group     BP 11/08/15 1654 123/66     Pulse Rate 11/08/15 1654 (!) 110     Resp 11/08/15 1654 18     Temp 11/08/15 1654 98.3 F (36.8 C)     Temp Source 11/08/15 1654 Oral     SpO2 11/08/15 1654 95 %     Weight 11/08/15 1656 171 lb (77.6 kg)      Height 11/08/15 1656 '5\' 10"'$  (1.778 m)     Head Circumference --      Peak Flow --      Pain Score 11/08/15 1656 0   Constitutional: Alert and oriented. Well appearing and in no distress. Eyes: Conjunctivae are normal. Normal extraocular movements. ENT   Head: Normocephalic and atraumatic.   Nose: No congestion/rhinnorhea.   Mouth/Throat: Mucous membranes are moist.   Neck: No stridor. Hematological/Lymphatic/Immunilogical: No cervical lymphadenopathy. Cardiovascular: Tachycardic, regular rhythm.  No murmurs, rubs, or gallops. Respiratory: Slightly increased respiratory effort. Diminished breath sounds on the right side. Gastrointestinal: Soft and nontender. No distention.  Genitourinary: Deferred Musculoskeletal: Normal range of motion in all extremities. No lower extremity edema. Neurologic:  Normal speech and language. No gross focal neurologic deficits are appreciated.  Skin:  Skin is warm, dry  and intact. No rash noted. Psychiatric: Mood and affect are normal. Speech and behavior are normal. Patient exhibits appropriate insight and judgment.  ____________________________________________    LABS (pertinent positives/negatives)  Labs Reviewed  BASIC METABOLIC PANEL - Abnormal; Notable for the following:       Result Value   Glucose, Bld 111 (*)    All other components within normal limits  CBC - Abnormal; Notable for the following:    WBC 12.3 (*)    RDW 15.9 (*)    All other components within normal limits  LACTIC ACID, PLASMA - Abnormal; Notable for the following:    Lactic Acid, Venous 2.2 (*)    All other components within normal limits  CULTURE, BLOOD (ROUTINE X 2)  CULTURE, BLOOD (ROUTINE X 2)  TROPONIN I  LACTIC ACID, PLASMA     ____________________________________________   EKG  I, Nance Pear, attending physician, personally viewed and interpreted this EKG  EKG Time: 1710 Rate: 108 Rhythm: Tachycardia, AV dual paced Axis: extreme  rightward axis Intervals: qtc 503 QRS: paced rhythm, IVCD ST changes: no st elevation Impression: abnormal ekg  ____________________________________________    RADIOLOGY  CXR IMPRESSION:  Little interval change compared to 09/07/2015 chest radiograph with  persistent large consolidation within the right lung and  medium-sized a large right pleural effusion.      I, Ewel Lona, personally viewed and evaluated these images (plain radiographs) as part of my medical decision making. ____________________________________________   PROCEDURES  Procedures  ____________________________________________   INITIAL IMPRESSION / ASSESSMENT AND PLAN / ED COURSE  Pertinent labs & imaging results that were available during my care of the patient were reviewed by me and considered in my medical decision making (see chart for details).  Concerning for pneumonia with likely sepsis. Code sepsis was called. Patient will be given broad-spectrum antibiotics. Patient will be admitted to the hospital service.  ____________________________________________   FINAL CLINICAL IMPRESSION(S) / ED DIAGNOSES  Pneumonia Sepsis  Note: This dictation was prepared with Dragon dictation. Any transcriptional errors that result from this process are unintentional    Nance Pear, MD 11/09/15 0000

## 2015-11-08 NOTE — ED Notes (Signed)
Spoke with MD regarding typical fluid bolus in code sepsis patient. MD cites that patient as an EF of 20% and he does not wish for the patient to be given the full 30cc/kg bolus. MD with VORB for NS x 1 liter bolus. Order to be entered and carried by this RN.

## 2015-11-09 ENCOUNTER — Encounter: Payer: Self-pay | Admitting: Internal Medicine

## 2015-11-09 ENCOUNTER — Inpatient Hospital Stay: Payer: Medicare Other | Admitting: Internal Medicine

## 2015-11-09 ENCOUNTER — Inpatient Hospital Stay: Payer: Medicare Other

## 2015-11-09 DIAGNOSIS — I5032 Chronic diastolic (congestive) heart failure: Secondary | ICD-10-CM | POA: Diagnosis present

## 2015-11-09 DIAGNOSIS — E871 Hypo-osmolality and hyponatremia: Secondary | ICD-10-CM | POA: Diagnosis present

## 2015-11-09 DIAGNOSIS — Z66 Do not resuscitate: Secondary | ICD-10-CM | POA: Diagnosis not present

## 2015-11-09 DIAGNOSIS — I110 Hypertensive heart disease with heart failure: Secondary | ICD-10-CM | POA: Diagnosis present

## 2015-11-09 DIAGNOSIS — Z85118 Personal history of other malignant neoplasm of bronchus and lung: Secondary | ICD-10-CM | POA: Diagnosis not present

## 2015-11-09 DIAGNOSIS — I429 Cardiomyopathy, unspecified: Secondary | ICD-10-CM | POA: Diagnosis present

## 2015-11-09 DIAGNOSIS — A419 Sepsis, unspecified organism: Secondary | ICD-10-CM | POA: Diagnosis present

## 2015-11-09 DIAGNOSIS — E1140 Type 2 diabetes mellitus with diabetic neuropathy, unspecified: Secondary | ICD-10-CM | POA: Diagnosis present

## 2015-11-09 DIAGNOSIS — Z23 Encounter for immunization: Secondary | ICD-10-CM | POA: Diagnosis not present

## 2015-11-09 DIAGNOSIS — I482 Chronic atrial fibrillation: Secondary | ICD-10-CM | POA: Diagnosis present

## 2015-11-09 DIAGNOSIS — A412 Sepsis due to unspecified staphylococcus: Secondary | ICD-10-CM | POA: Diagnosis present

## 2015-11-09 DIAGNOSIS — Z882 Allergy status to sulfonamides status: Secondary | ICD-10-CM | POA: Diagnosis not present

## 2015-11-09 DIAGNOSIS — G7000 Myasthenia gravis without (acute) exacerbation: Secondary | ICD-10-CM | POA: Diagnosis present

## 2015-11-09 DIAGNOSIS — N390 Urinary tract infection, site not specified: Secondary | ICD-10-CM | POA: Diagnosis present

## 2015-11-09 DIAGNOSIS — N400 Enlarged prostate without lower urinary tract symptoms: Secondary | ICD-10-CM | POA: Diagnosis present

## 2015-11-09 DIAGNOSIS — E039 Hypothyroidism, unspecified: Secondary | ICD-10-CM | POA: Diagnosis present

## 2015-11-09 DIAGNOSIS — Z87891 Personal history of nicotine dependence: Secondary | ICD-10-CM | POA: Diagnosis not present

## 2015-11-09 DIAGNOSIS — Z95810 Presence of automatic (implantable) cardiac defibrillator: Secondary | ICD-10-CM | POA: Diagnosis not present

## 2015-11-09 DIAGNOSIS — E7800 Pure hypercholesterolemia, unspecified: Secondary | ICD-10-CM | POA: Diagnosis present

## 2015-11-09 DIAGNOSIS — Z880 Allergy status to penicillin: Secondary | ICD-10-CM | POA: Diagnosis not present

## 2015-11-09 DIAGNOSIS — R0609 Other forms of dyspnea: Secondary | ICD-10-CM | POA: Diagnosis present

## 2015-11-09 DIAGNOSIS — Z7982 Long term (current) use of aspirin: Secondary | ICD-10-CM | POA: Diagnosis not present

## 2015-11-09 DIAGNOSIS — J189 Pneumonia, unspecified organism: Secondary | ICD-10-CM | POA: Diagnosis present

## 2015-11-09 DIAGNOSIS — K219 Gastro-esophageal reflux disease without esophagitis: Secondary | ICD-10-CM | POA: Diagnosis present

## 2015-11-09 DIAGNOSIS — Z8249 Family history of ischemic heart disease and other diseases of the circulatory system: Secondary | ICD-10-CM | POA: Diagnosis not present

## 2015-11-09 LAB — LACTIC ACID, PLASMA: LACTIC ACID, VENOUS: 2 mmol/L — AB (ref 0.5–1.9)

## 2015-11-09 LAB — TSH: TSH: 6.005 u[IU]/mL — ABNORMAL HIGH (ref 0.350–4.500)

## 2015-11-09 LAB — MRSA PCR SCREENING: MRSA by PCR: POSITIVE — AB

## 2015-11-09 LAB — MAGNESIUM: Magnesium: 2 mg/dL (ref 1.7–2.4)

## 2015-11-09 MED ORDER — ASPIRIN EC 81 MG PO TBEC
81.0000 mg | DELAYED_RELEASE_TABLET | Freq: Every day | ORAL | Status: DC
  Administered 2015-11-09 – 2015-11-12 (×4): 81 mg via ORAL
  Filled 2015-11-09 (×4): qty 1

## 2015-11-09 MED ORDER — VANCOMYCIN HCL 10 G IV SOLR
1250.0000 mg | INTRAVENOUS | Status: DC
  Administered 2015-11-09 – 2015-11-12 (×5): 1250 mg via INTRAVENOUS
  Filled 2015-11-09 (×6): qty 1250

## 2015-11-09 MED ORDER — DEXTROSE 5 % IV SOLN
2.0000 g | Freq: Three times a day (TID) | INTRAVENOUS | Status: DC
  Administered 2015-11-09 – 2015-11-12 (×10): 2 g via INTRAVENOUS
  Filled 2015-11-09 (×13): qty 2

## 2015-11-09 MED ORDER — MUPIROCIN 2 % EX OINT
TOPICAL_OINTMENT | Freq: Two times a day (BID) | CUTANEOUS | Status: DC
  Administered 2015-11-09 – 2015-11-10 (×4): via NASAL
  Administered 2015-11-11: 1 via NASAL
  Administered 2015-11-12: 10:00:00 via NASAL
  Filled 2015-11-09: qty 22

## 2015-11-09 MED ORDER — ACETAMINOPHEN 325 MG PO TABS: 650.0000 mg | ORAL_TABLET | Freq: Four times a day (QID) | ORAL | Status: DC | PRN

## 2015-11-09 MED ORDER — VITAMIN D 1000 UNITS PO TABS
5000.0000 [IU] | ORAL_TABLET | Freq: Every morning | ORAL | Status: DC
  Administered 2015-11-09 – 2015-11-12 (×4): 5000 [IU] via ORAL
  Filled 2015-11-09 (×4): qty 5

## 2015-11-09 MED ORDER — DOCUSATE SODIUM 100 MG PO CAPS
100.0000 mg | ORAL_CAPSULE | Freq: Two times a day (BID) | ORAL | Status: DC
  Administered 2015-11-09 – 2015-11-11 (×6): 100 mg via ORAL
  Filled 2015-11-09 (×6): qty 1

## 2015-11-09 MED ORDER — DIGOXIN 125 MCG PO TABS
0.1250 mg | ORAL_TABLET | Freq: Every morning | ORAL | Status: DC
  Administered 2015-11-09 – 2015-11-12 (×4): 0.125 mg via ORAL
  Filled 2015-11-09 (×5): qty 1

## 2015-11-09 MED ORDER — POTASSIUM CHLORIDE CRYS ER 10 MEQ PO TBCR
10.0000 meq | EXTENDED_RELEASE_TABLET | Freq: Every day | ORAL | Status: DC
  Administered 2015-11-09 – 2015-11-12 (×4): 10 meq via ORAL
  Filled 2015-11-09 (×4): qty 1

## 2015-11-09 MED ORDER — GLUCOSAMINE-CHONDROITIN PO CAPS: 1.0000 | ORAL_CAPSULE | Freq: Two times a day (BID) | ORAL | Status: DC

## 2015-11-09 MED ORDER — TAMSULOSIN HCL 0.4 MG PO CAPS
0.4000 mg | ORAL_CAPSULE | Freq: Every day | ORAL | Status: DC
  Administered 2015-11-09 – 2015-11-11 (×3): 0.4 mg via ORAL
  Filled 2015-11-09 (×3): qty 1

## 2015-11-09 MED ORDER — COENZYME Q10 100 MG PO CAPS: 100.0000 mg | ORAL_CAPSULE | Freq: Every day | ORAL | Status: DC

## 2015-11-09 MED ORDER — ONDANSETRON HCL 4 MG/2ML IJ SOLN
4.0000 mg | Freq: Once | INTRAMUSCULAR | Status: AC
  Administered 2015-11-09: 4 mg via INTRAVENOUS
  Filled 2015-11-09: qty 2

## 2015-11-09 MED ORDER — PANTOPRAZOLE SODIUM 40 MG PO TBEC: 40.0000 mg | DELAYED_RELEASE_TABLET | Freq: Every day | ORAL | Status: DC

## 2015-11-09 MED ORDER — FUROSEMIDE 20 MG PO TABS
20.0000 mg | ORAL_TABLET | ORAL | Status: DC | PRN
  Administered 2015-11-09: 20 mg via ORAL
  Filled 2015-11-09: qty 1

## 2015-11-09 MED ORDER — ONDANSETRON HCL 4 MG/2ML IJ SOLN
4.0000 mg | Freq: Four times a day (QID) | INTRAMUSCULAR | Status: DC | PRN
  Administered 2015-11-09: 4 mg via INTRAVENOUS
  Filled 2015-11-09: qty 2

## 2015-11-09 MED ORDER — HYDROCORTISONE 10 MG PO TABS
10.0000 mg | ORAL_TABLET | Freq: Two times a day (BID) | ORAL | Status: DC
Start: 2015-11-09 — End: 2015-11-12
  Administered 2015-11-09 – 2015-11-12 (×8): 10 mg via ORAL
  Filled 2015-11-09 (×8): qty 1

## 2015-11-09 MED ORDER — INFLUENZA VAC SPLIT QUAD 0.5 ML IM SUSY
0.5000 mL | PREFILLED_SYRINGE | INTRAMUSCULAR | Status: AC
  Administered 2015-11-10: 11:00:00 0.5 mL via INTRAMUSCULAR
  Filled 2015-11-09: qty 0.5

## 2015-11-09 MED ORDER — GABAPENTIN 300 MG PO CAPS
300.0000 mg | ORAL_CAPSULE | Freq: Three times a day (TID) | ORAL | Status: DC
  Administered 2015-11-09 – 2015-11-12 (×9): 300 mg via ORAL
  Filled 2015-11-09 (×9): qty 1

## 2015-11-09 MED ORDER — HYDROCODONE-ACETAMINOPHEN 5-325 MG PO TABS: 1.0000 | ORAL_TABLET | ORAL | Status: DC | PRN

## 2015-11-09 MED ORDER — SIMVASTATIN 20 MG PO TABS
20.0000 mg | ORAL_TABLET | Freq: Every day | ORAL | Status: DC
  Administered 2015-11-09 – 2015-11-11 (×3): 20 mg via ORAL
  Filled 2015-11-09 (×3): qty 1

## 2015-11-09 MED ORDER — PYRIDOSTIGMINE BROMIDE 60 MG PO TABS
60.0000 mg | ORAL_TABLET | Freq: Two times a day (BID) | ORAL | Status: DC
Start: 2015-11-09 — End: 2015-11-12
  Administered 2015-11-09 – 2015-11-12 (×8): 60 mg via ORAL
  Filled 2015-11-09 (×9): qty 1

## 2015-11-09 MED ORDER — VITAMIN C 500 MG PO TABS
500.0000 mg | ORAL_TABLET | Freq: Three times a day (TID) | ORAL | Status: DC
Start: 2015-11-09 — End: 2015-11-12
  Administered 2015-11-09 – 2015-11-12 (×9): 500 mg via ORAL
  Filled 2015-11-09 (×9): qty 1

## 2015-11-09 MED ORDER — CHLORHEXIDINE GLUCONATE CLOTH 2 % EX PADS
6.0000 | MEDICATED_PAD | Freq: Every day | CUTANEOUS | Status: DC
  Administered 2015-11-09 – 2015-11-10 (×2): 6 via TOPICAL

## 2015-11-09 MED ORDER — METOPROLOL SUCCINATE ER 25 MG PO TB24
25.0000 mg | ORAL_TABLET | Freq: Every day | ORAL | Status: DC
  Administered 2015-11-10: 11:00:00 25 mg via ORAL
  Filled 2015-11-09 (×2): qty 1

## 2015-11-09 MED ORDER — MIDODRINE HCL 5 MG PO TABS
2.5000 mg | ORAL_TABLET | Freq: Three times a day (TID) | ORAL | Status: DC
  Administered 2015-11-09 – 2015-11-12 (×9): 2.5 mg via ORAL
  Filled 2015-11-09: qty 1
  Filled 2015-11-09: qty 0.5
  Filled 2015-11-09 (×4): qty 1
  Filled 2015-11-09 (×2): qty 0.5
  Filled 2015-11-09 (×4): qty 1

## 2015-11-09 MED ORDER — METHENAMINE HIPPURATE 1 G PO TABS: 1.0000 g | ORAL_TABLET | Freq: Two times a day (BID) | ORAL | Status: DC

## 2015-11-09 MED ORDER — LEVOTHYROXINE SODIUM 50 MCG PO TABS
50.0000 ug | ORAL_TABLET | Freq: Every day | ORAL | Status: DC
  Administered 2015-11-09 – 2015-11-10 (×2): 50 ug via ORAL
  Filled 2015-11-09 (×2): qty 1

## 2015-11-09 MED ORDER — SODIUM CHLORIDE 0.9 % IV SOLN
INTRAVENOUS | Status: DC
  Administered 2015-11-09: 04:00:00 via INTRAVENOUS

## 2015-11-09 MED ORDER — VITAMIN E 180 MG (400 UNIT) PO CAPS
400.0000 [IU] | ORAL_CAPSULE | Freq: Every day | ORAL | Status: DC
  Administered 2015-11-09 – 2015-11-12 (×4): 400 [IU] via ORAL
  Filled 2015-11-09 (×6): qty 1

## 2015-11-09 MED ORDER — VITAMIN B-12 1000 MCG PO TABS
500.0000 ug | ORAL_TABLET | Freq: Every day | ORAL | Status: DC
  Administered 2015-11-09 – 2015-11-12 (×4): 500 ug via ORAL
  Filled 2015-11-09 (×4): qty 1

## 2015-11-09 MED ORDER — RISAQUAD PO CAPS
1.0000 | ORAL_CAPSULE | Freq: Every day | ORAL | Status: DC
  Administered 2015-11-09 – 2015-11-12 (×4): 1 via ORAL
  Filled 2015-11-09 (×4): qty 1

## 2015-11-09 MED ORDER — ACETAMINOPHEN 650 MG RE SUPP
650.0000 mg | Freq: Four times a day (QID) | RECTAL | Status: DC | PRN
Start: 2015-11-09 — End: 2015-11-12

## 2015-11-09 MED ORDER — FAMOTIDINE 40 MG PO TABS
40.0000 mg | ORAL_TABLET | Freq: Every day | ORAL | Status: DC
  Administered 2015-11-09 – 2015-11-11 (×3): 40 mg via ORAL
  Filled 2015-11-09 (×3): qty 1

## 2015-11-09 MED ORDER — SAW PALMETTO-PUMPKIN SEED OIL 160-100 MG PO CAPS: 1.0000 | ORAL_CAPSULE | Freq: Every day | ORAL | Status: DC

## 2015-11-09 MED ORDER — APIXABAN 5 MG PO TABS
5.0000 mg | ORAL_TABLET | Freq: Two times a day (BID) | ORAL | Status: DC
  Administered 2015-11-09 – 2015-11-12 (×7): 5 mg via ORAL
  Filled 2015-11-09 (×7): qty 1

## 2015-11-09 MED ORDER — METHENAMINE MANDELATE 1 G PO TABS
1000.0000 mg | ORAL_TABLET | Freq: Two times a day (BID) | ORAL | Status: DC
  Administered 2015-11-09 – 2015-11-12 (×7): 1000 mg via ORAL
  Filled 2015-11-09 (×8): qty 1

## 2015-11-09 MED ORDER — SODIUM CHLORIDE 0.9% FLUSH
3.0000 mL | Freq: Two times a day (BID) | INTRAVENOUS | Status: DC
  Administered 2015-11-09 – 2015-11-12 (×9): 3 mL via INTRAVENOUS

## 2015-11-09 MED ORDER — ONDANSETRON HCL 4 MG PO TABS
4.0000 mg | ORAL_TABLET | Freq: Four times a day (QID) | ORAL | Status: DC | PRN
  Administered 2015-11-11: 12:00:00 4 mg via ORAL
  Filled 2015-11-09: qty 1

## 2015-11-09 NOTE — ED Notes (Signed)
Patient c/o significant nausea. MD aware. Protocol orders for Zofran '4mg'$  IVP utilized at this time.

## 2015-11-09 NOTE — H&P (Signed)
Russell Howard is an 81 y.o. male.   Chief Complaint: Cough HPI: The patient with past medical history of diastolic congestive heart failure as well as lung cancer presents emergency department complaining of cough. The patient states that he is always mildly short of breath at baseline but has become dramatically more so today. He has had a cough since Monday. He also complains of general malaise for the last 4 days. Denies fevers or vomiting but his cough is productive of thickened yellow sputum. On arrival to the emergency department the patient's oxygen saturations were normal on room air. Chest x-ray revealed moderately sized right sided effusion is well as lower lobe consolidation. This is virtually unchanged in appearance from x-ray 2 months ago. Notably, the patient has had a loculated effusion in the past that was serous inflammatory fluid as well as chylous on thoracentesis performed in January 2017. The patient is afebrile and his heart rate is increased and laboratory evaluation revealed leukocytosis which prompted the emergency department staff to admit for further evaluation.  Past Medical History:  Diagnosis Date  . Acquired hypothyroidism 11/02/2014  . BPH (benign prostatic hyperplasia)   . Cardiac defibrillator in place 11/02/2014  . Cardiomyopathy ([place])   . CHF (congestive heart failure) ([place])   . Chronic diastolic heart failure ([place]) 11/02/2014  . Gastro-esophageal reflux disease without esophagitis 11/02/2014  . H/O malignant neoplasm 11/09/2014  . Heart valve disease 11/02/2014  . History of atrial fibrillation 11/02/2014  . Lung cancer ([place])   . Neuropathy ([place]) 11/02/2014  . Orthostasis 11/02/2014  . Pure hypercholesterolemia 11/02/2014  . Valvular heart disease     Past Surgical History:  Procedure Laterality Date  . APPENDECTOMY    . DUAL ICD IMPLANT  2007/2009   implantable cardiac defibrillator  . PORT-A-CATH REMOVAL      Family History  Problem Relation Age of Onset  .  Hypertension    . Heart disease     Social History:  reports that he quit smoking about 33 years ago. His smoking use included Cigarettes. He has never used smokeless tobacco. He reports that he does not drink alcohol or use drugs.  Allergies:  Allergies  Allergen Reactions  . Penicillin G Hives    Has patient had a PCN reaction causing immediate rash, facial/tongue/throat swelling, SOB or lightheadedness with hypotension: Yes Has patient had a PCN reaction causing severe rash involving mucus membranes or skin necrosis: No Has patient had a PCN reaction that required hospitalization No Has patient had a PCN reaction occurring within the last 10 years: No If all of the above answers are "NO", then may proceed with Cephalosporin use.  . Sulfa Antibiotics Rash    Medications Prior to Admission  Medication Sig Dispense Refill  . acidophilus (RISAQUAD) CAPS capsule Take 1 capsule by mouth daily.    Marland Kitchen apixaban (ELIQUIS) 5 MG TABS tablet Take 1 tablet (5 mg total) by mouth 2 (two) times daily. 60 tablet 0  . Ascorbic Acid (VITAMIN C) 1000 MG tablet Take 500 mg by mouth 3 (three) times daily. Reported on 06/14/2015    . aspirin EC 81 MG tablet Take 81 mg by mouth daily.     . Cholecalciferol 5000 units TABS Take 5,000 Units by mouth every morning.    . Coenzyme Q10 (TH CO Q-10) 100 MG capsule Take 100 mg by mouth daily.     . digoxin (LANOXIN) 0.125 MG tablet Take 0.125 mg by mouth every morning.     Marland Kitchen  furosemide (LASIX) 20 MG tablet Take 20 mg by mouth every other day as needed (as needed for water retention).     . gabapentin (NEURONTIN) 300 MG capsule Take 300 mg by mouth 3 (three) times daily.     . Glucosamine-Chondroit-Vit C-Mn (GLUCOSAMINE-CHONDROITIN) CAPS Take 1 capsule by mouth 2 (two) times daily.     . hydrocortisone (CORTEF) 10 MG tablet Take by mouth 2 (two) times daily.     Marland Kitchen levothyroxine (SYNTHROID, LEVOTHROID) 50 MCG tablet Take 50 mcg by mouth daily before breakfast.    .  methenamine (HIPREX) 1 g tablet TAKE 1 TABLET(1 GRAM) BY MOUTH TWICE DAILY WITH A MEAL 60 tablet 0  . metoprolol succinate (TOPROL XL) 25 MG 24 hr tablet Take 1 tablet (25 mg total) by mouth daily. 30 tablet 1  . midodrine (PROAMATINE) 2.5 MG tablet Take 2.5 mg by mouth 3 (three) times daily.    . pantoprazole (PROTONIX) 40 MG tablet Take 40 mg by mouth daily before breakfast.     . potassium chloride (MICRO-K) 10 MEQ CR capsule Take 10 mEq by mouth every other day.     . pyridostigmine (MESTINON) 60 MG tablet Take 60 mg by mouth 2 (two) times daily.     . ranitidine (ZANTAC) 300 MG tablet Take 300 mg by mouth at bedtime.    . SAW PALMETTO-PUMPKIN SEED OIL PO Take 1 capsule by mouth daily.     . silodosin (RAPAFLO) 8 MG CAPS capsule Take 1 capsule (8 mg total) by mouth daily with breakfast. 30 capsule 3  . simvastatin (ZOCOR) 20 MG tablet Take 20 mg by mouth daily at 6 PM.     . vitamin B-12 (CYANOCOBALAMIN) 500 MCG tablet Take 500 mcg by mouth daily.     . vitamin E 400 UNIT capsule Take 400 Units by mouth daily.       Results for orders placed or performed during the hospital encounter of 11/08/15 (from the past 48 hour(s))  Basic metabolic panel     Status: Abnormal   Collection Time: 11/08/15  5:03 PM  Result Value Ref Range   Sodium 137 135 - 145 mmol/L   Potassium 4.7 3.5 - 5.1 mmol/L   Chloride 102 101 - 111 mmol/L   CO2 28 22 - 32 mmol/L   Glucose, Bld 111 (H) 65 - 99 mg/dL   BUN 20 6 - 20 mg/dL   Creatinine, Ser 1.04 0.61 - 1.24 mg/dL   Calcium 9.5 8.9 - 10.3 mg/dL   GFR calc non Af Amer >60 >60 mL/min   GFR calc Af Amer >60 >60 mL/min    Comment: (NOTE) The eGFR has been calculated using the CKD EPI equation. This calculation has not been validated in all clinical situations. eGFR's persistently <60 mL/min signify possible Chronic Kidney Disease.    Anion gap 7 5 - 15  CBC     Status: Abnormal   Collection Time: 11/08/15  5:03 PM  Result Value Ref Range   WBC 12.3 (H)  3.8 - 10.6 K/uL   RBC 4.91 4.40 - 5.90 MIL/uL   Hemoglobin 14.4 13.0 - 18.0 g/dL   HCT 44.7 40.0 - 52.0 %   MCV 91.2 80.0 - 100.0 fL   MCH 29.3 26.0 - 34.0 pg   MCHC 32.2 32.0 - 36.0 g/dL   RDW 15.9 (H) 11.5 - 14.5 %   Platelets 211 150 - 440 K/uL  Troponin I     Status: None  Collection Time: 11/08/15  5:03 PM  Result Value Ref Range   Troponin I <0.03 <0.03 ng/mL  Magnesium     Status: None   Collection Time: 11/08/15  5:03 PM  Result Value Ref Range   Magnesium 2.0 1.7 - 2.4 mg/dL  Lactic acid, plasma     Status: Abnormal   Collection Time: 11/08/15 10:49 PM  Result Value Ref Range   Lactic Acid, Venous 2.2 (HH) 0.5 - 1.9 mmol/L    Comment: CRITICAL RESULT CALLED TO, READ BACK BY AND VERIFIED WITH BRYAN GRAY ON 11/08/15 AT 2336 BY TLB   Blood culture (routine x 2)     Status: None (Preliminary result)   Collection Time: 11/08/15 10:49 PM  Result Value Ref Range   Specimen Description BLOOD LEFT WRIST    Special Requests BOTTLES DRAWN AEROBIC AND ANAEROBIC 3ML    Culture NO GROWTH < 12 HOURS    Report Status PENDING   Blood culture (routine x 2)     Status: None (Preliminary result)   Collection Time: 11/08/15 10:49 PM  Result Value Ref Range   Specimen Description BLOOD LEFT FOREARM    Special Requests BOTTLES DRAWN AEROBIC AND ANAEROBIC 2ML    Culture NO GROWTH < 12 HOURS    Report Status PENDING   Lactic acid, plasma     Status: Abnormal   Collection Time: 11/09/15  1:48 AM  Result Value Ref Range   Lactic Acid, Venous 2.0 (HH) 0.5 - 1.9 mmol/L    Comment: CRITICAL RESULT CALLED TO, READ BACK BY AND VERIFIED WITH BRIAN GRAY @ 0240 ON 11/09/2015 BY CAF    Dg Chest 2 View  Result Date: 11/08/2015 CLINICAL DATA:  Coughing.  Hemoptysis.  Leukocytosis. EXAM: CHEST  2 VIEW COMPARISON:  Chest CT 09/07/2015 and chest radiograph 09/07/2015 FINDINGS: Left chest wall 3 lead pacemaker/ AICD is in unchanged position with leads in the expected location. Prosthetic aortic valve  is unchanged. There is again extensive volume loss of the right hemi thorax with large consolidation and pleural effusion. The left lung remains relatively clear with mild within the inferior left parahilar opacity. No pneumothorax. There is little change compared to the chest radiograph of 09/07/2015. IMPRESSION: Little interval change compared to 09/07/2015 chest radiograph with persistent large consolidation within the right lung and medium-sized a large right pleural effusion. Electronically Signed   By: Ulyses Jarred M.D.   On: 11/08/2015 17:53    Review of Systems  Constitutional: Negative for chills and fever.  HENT: Negative for sore throat and tinnitus.   Eyes: Negative for blurred vision and redness.  Respiratory: Positive for cough, sputum production and shortness of breath (baseline).   Cardiovascular: Negative for chest pain, palpitations, orthopnea and PND.  Gastrointestinal: Negative for abdominal pain, diarrhea, nausea and vomiting.  Genitourinary: Negative for dysuria, frequency and urgency.  Musculoskeletal: Negative for joint pain and myalgias.  Skin: Negative for rash.       No lesions  Neurological: Negative for speech change, focal weakness and weakness.  Endo/Heme/Allergies: Does not bruise/bleed easily.       No temperature intolerance  Psychiatric/Behavioral: Negative for depression and suicidal ideas.    Blood pressure (!) 109/55, pulse (!) 112, temperature 99.3 F (37.4 C), temperature source Oral, resp. rate 17, height 5' 10" (1.778 m), weight 79.7 kg (175 lb 9.6 oz), SpO2 92 %. Physical Exam  Constitutional: He is oriented to person, place, and time. He appears well-developed and well-nourished. No distress.  HENT:  Head: Normocephalic and atraumatic.  Mouth/Throat: Oropharynx is clear and moist.  Eyes: Conjunctivae and EOM are normal. Pupils are equal, round, and reactive to light. No scleral icterus.  Neck: Normal range of motion. Neck supple. No JVD  present. No tracheal deviation present. No thyromegaly present.  Cardiovascular: Normal rate and regular rhythm.  Exam reveals distant heart sounds. Exam reveals no gallop and no friction rub.   No murmur heard. Respiratory: Effort normal and breath sounds normal. No respiratory distress.  GI: Soft. Bowel sounds are normal. He exhibits no distension. There is no tenderness.  Genitourinary:  Genitourinary Comments: Deferred  Musculoskeletal: Normal range of motion. He exhibits no edema.  Lymphadenopathy:    He has no cervical adenopathy.  Neurological: He is alert and oriented to person, place, and time. No cranial nerve deficit.  Skin: Skin is warm and dry. No rash noted. No erythema.  Psychiatric: He has a normal mood and affect. His behavior is normal. Judgment and thought content normal.     Assessment/Plan This is a 81 year old male admitted for sepsis secondary to pneumonia. 1. Sepsis: The patient criteria via tachycardia and leukocytosis. Blood cultures were obtained in the emergency department. He has received 1 L of normal saline limit his early fluid resuscitation due to his poor cardiac output. He is received vancomycin and cefepime. Follow blood cultures for growth and sensitivities. The patient is hemodynamically stable. 2. Pneumonia: Likely postobstructive. I placed a pulmonology consult to assess the need for bronchoscopy. Thoracentesis seems unnecessary at this time as oxygen saturations are normal. 3. Congestive heart failure: Diastolic; ventricular paced with AICD. Continue Lasix and digoxin per home regimen. 4. Hypertension: Controlled. Blood pressures actually fairly low although he is stable. Continue metoprolol for heart rate control. Continue midodrine for hyponatremia/decreased intravascular volume 4. Hypothyroidism: Continue Synthroid 5. Hyperlipidemia: Continue statin therapy 6. Recurrent urinary tract infection: Continue methenamine. I have held his oral antibiotics  as the patient is received antibiotics in the emergency department. 7. Myasthenia gravis: Continue pyridostigmine 8. BPH: Continue Flomax 9. DVT prophylaxis: Eliquis 10. GI prophylaxis: H2 blocker and PPI per home regimen The patient is a DO NOT RESUSCITATE. Time spent on admission orders and patient care approximately 45 minutes  Harrie Foreman, MD 11/09/2015, 7:34 AM

## 2015-11-09 NOTE — ED Notes (Signed)
MD out of room from seeing patient for admission. Discussed trending BPs; aware that SBP in the 80s. MD reports that patient advised that this is "good for him". Will attempt to obtain pressure with patient in high fowler's to see if this will improve; if not, Dr. Marcille Blanco is ok with him going to the assigned unit with the SBP being in the 80s.

## 2015-11-09 NOTE — Progress Notes (Signed)
Pharmacy Antibiotic Note  Russell Howard is a 81 y.o. male admitted on 11/08/2015 with pneumonia.  Pharmacy has been consulted for vancomycin and cefepime dosing.  Plan: DW 77.6kg  Vd 54L kei 0.054 hr-1  T1/2 13 hours Vancomycin 1250 mg q 18 hours ordered with stacked dosing. Level before 5th dose. Goal trough 15-20.  Cefepime 2 grams q 8 hours ordered.  Height: '5\' 10"'$  (177.8 cm) Weight: 171 lb (77.6 kg) IBW/kg (Calculated) : 73  Temp (24hrs), Avg:98.1 F (36.7 C), Min:97.8 F (36.6 C), Max:98.3 F (36.8 C)   Recent Labs Lab 11/08/15 1703 11/08/15 2249  WBC 12.3*  --   CREATININE 1.04  --   LATICACIDVEN  --  2.2*    Estimated Creatinine Clearance: 60.4 mL/min (by C-G formula based on SCr of 1.04 mg/dL).    Allergies  Allergen Reactions  . Penicillin G Hives    Has patient had a PCN reaction causing immediate rash, facial/tongue/throat swelling, SOB or lightheadedness with hypotension: Yes Has patient had a PCN reaction causing severe rash involving mucus membranes or skin necrosis: No Has patient had a PCN reaction that required hospitalization No Has patient had a PCN reaction occurring within the last 10 years: No If all of the above answers are "NO", then may proceed with Cephalosporin use.  . Sulfa Antibiotics Rash    Antimicrobials this admission: vancomycin  >>  cefepime  >>   Dose adjustments this admission:   Microbiology results: 10/4 BCx: pending 5/12 MRSA PCR: (+)    10.4 CXR:  Large right consolidation  Thank you for allowing pharmacy to be a part of this patient's care.  Russell Howard S 11/09/2015 1:46 AM

## 2015-11-09 NOTE — Progress Notes (Signed)
Pt admitted to unit with shortness of breath.  On telemetry with cotinuous pulse ox.  SAO2 running 85-88.  Asked pt if I could place nasal canula on him with oxygen and he states no.  Pt states he doesn't want to "depend on oxygen".  Explained to pt that if he doesn't have enough oxygen, his heart has to beat faster etc and this can be very tiring.  Pt still refuses oxygen.  Dtr in law at bedside and tried to talk to him but pt still refused. Sent text message to [facility] doc to inform. Dorna Bloom RN

## 2015-11-09 NOTE — Progress Notes (Signed)
[facility] [place] NAME: Hilman Kissling    MR#:  [id number]  DATE OF BIRTH:  Jul 02, 1934  SUBJECTIVE: Admitted for community-acquired pneumonia. Patient has history of right pleural effusion, lung cancer. Patient sayshe doesn't feel good and doesn't want to eat.   CHIEF COMPLAINT:   Chief Complaint  Patient presents with  . Cough  . Hemoptysis    REVIEW OF SYSTEMS:    Review of Systems  Constitutional: Positive for fever and malaise/fatigue. Negative for chills.  HENT: Negative for hearing loss.   Eyes: Negative for blurred vision, double vision and photophobia.  Respiratory: Positive for cough and shortness of breath. Negative for hemoptysis.   Cardiovascular: Negative for palpitations, orthopnea and leg swelling.  Gastrointestinal: Negative for abdominal pain, diarrhea and vomiting.  Genitourinary: Negative for dysuria and urgency.  Musculoskeletal: Negative for myalgias and neck pain.  Skin: Negative for rash.  Neurological: Positive for weakness. Negative for dizziness, focal weakness, seizures and headaches.  Psychiatric/Behavioral: Negative for memory loss. The patient does not have insomnia.     Nutrition:  Tolerating Diet:no Tolerating PT:      DRUG ALLERGIES:   Allergies  Allergen Reactions  . Penicillin G Hives    Has patient had a PCN reaction causing immediate rash, facial/tongue/throat swelling, SOB or lightheadedness with hypotension: Yes Has patient had a PCN reaction causing severe rash involving mucus membranes or skin necrosis: No Has patient had a PCN reaction that required hospitalization No Has patient had a PCN reaction occurring within the last 10 years: No If all of the above answers are "NO", then may proceed with Cephalosporin use.  . Sulfa Antibiotics Rash    VITALS:  Blood pressure (!) 109/55, pulse (!) 112, temperature 99.3 F (37.4 C), temperature source Oral, resp. rate 17, height 5'  10" (1.778 m), weight 79.7 kg (175 lb 9.6 oz), SpO2 92 %.  PHYSICAL EXAMINATION:   Physical Exam  GENERAL:  81 y.o.-year-old patient lying in the bed with no acute distress.  EYES: Pupils equal, round, reactive to light and accommodation. No scleral icterus. Extraocular muscles intact.  HEENT: Head atraumatic, normocephalic. Oropharynx and nasopharynx clear.  NECK:  Supple, no jugular venous distention. No thyroid enlargement, no tenderness.  LUNGS: Decreased breath sounds on the right side, no wheeze, no rales.no crepitation. No use of accessory muscles of respiration.  CARDIOVASCULAR: S1, S2 normal. No murmurs, rubs, or gallops.  ABDOMEN: Soft, nontender, nondistended. Bowel sounds present. No organomegaly or mass.  EXTREMITIES: No pedal edema, cyanosis, or clubbing.  NEUROLOGIC: Cranial nerves II through XII are intact. Muscle strength 5/5 in all extremities. Sensation intact. Gait not checked.  PSYCHIATRIC: The patient is alert and oriented x 3.  SKIN: No obvious rash, lesion, or ulcer.    LABORATORY PANEL:   CBC  Recent Labs Lab 11/08/15 1703  WBC 12.3*  HGB 14.4  HCT 44.7  PLT 211   ------------------------------------------------------------------------------------------------------------------  Chemistries   Recent Labs Lab 11/08/15 1703  NA 137  K 4.7  CL 102  CO2 28  GLUCOSE 111*  BUN 20  CREATININE 1.04  CALCIUM 9.5  MG 2.0   ------------------------------------------------------------------------------------------------------------------  Cardiac Enzymes  Recent Labs Lab 11/08/15 1703  TROPONINI <0.03   ------------------------------------------------------------------------------------------------------------------  RADIOLOGY:  Dg Chest 2 View  Result Date: 11/08/2015 CLINICAL DATA:  Coughing.  Hemoptysis.  Leukocytosis. EXAM: CHEST  2 VIEW COMPARISON:  Chest CT 09/07/2015 and chest radiograph 09/07/2015 FINDINGS: Left chest wall 3 lead  pacemaker/ AICD is in unchanged position with leads in the expected location. Prosthetic aortic valve is unchanged. There is again extensive volume loss of the right hemi thorax with large consolidation and pleural effusion. The left lung remains relatively clear with mild within the inferior left parahilar opacity. No pneumothorax. There is little change compared to the chest radiograph of 09/07/2015. IMPRESSION: Little interval change compared to 09/07/2015 chest radiograph with persistent large consolidation within the right lung and medium-sized a large right pleural effusion. Electronically Signed   By: Ulyses Jarred M.D.   On: 11/08/2015 17:53     ASSESSMENT AND PLAN:   Active Problems:   Sepsis ([place])   1.Sepsis secondary to community-acquired pneumonia: Continue antibiotics, continue IV hydration, elevated lactic acid up to 2 on admission. #2. Chronic atrial fibrillation: Rate controlled. Continue full dose anticoagulation. Continue metoprolol succinate 25 mg daily. Also digoxin 0.125 mg every morning. 3.History of lung cancer, right pleural effusion: Pulmonary consult requested  For the same. #4. Hypothyroidism continue Synthroid., Elevated TSH: Adjust the dose of thyroid. #4 history f r chronic diastolic heart failure.. #6 BPH continue Flomax  Chronic faigue,, weakness, dyspnea on exertion     All the records are reviewed and case discussed with Care Management/Social Workerr. Management plans discussed with the patient, family and they are in agreement.  CODE STATUS: dnr  TOTAL TIME TAKING CARE OF THIS PATIENT: 63mnutes.   POSSIBLE D/C IN 1-2 DAYS, DEPENDING ON CLINICAL CONDITION.   KEpifanio LeschesM.D on 11/09/2015 at 11:36 AM  Between 7am to 6pm - Pager - [phone]  After 6pm go to [email or web address] - password [email or web address]  Office  [phone] CC: Primary care physician; SIdelle Crouch MD

## 2015-11-09 NOTE — Progress Notes (Signed)
PHARMACIST - PHYSICIAN ORDER COMMUNICATION  CONCERNING: P&T Medication Policy on Herbal Medications  DESCRIPTION:  This patient's order for:  Coenzyme Q10, Glucosamine/chondroitin, and Saw palmetto/pumpkin seed oil  has been noted.  This product(s) is classified as an "herbal" or natural product. Due to a lack of definitive safety studies or FDA approval, nonstandard manufacturing practices, plus the potential risk of unknown drug-drug interactions while on inpatient medications, the Pharmacy and Therapeutics Committee does not permit the use of "herbal" or natural products of this type within [facility].   ACTION TAKEN: The pharmacy department is unable to verify this order at this time. Please reevaluate patient's clinical condition at discharge and address if the herbal or natural product(s) should be resumed at that time.

## 2015-11-09 NOTE — ED Notes (Signed)
Dr. Marcille Blanco in to see and assess patient for admission to inpatient hospital unit.

## 2015-11-10 ENCOUNTER — Inpatient Hospital Stay: Payer: Medicare Other

## 2015-11-10 LAB — CBC
HCT: 35.3 % — ABNORMAL LOW (ref 40.0–52.0)
HEMOGLOBIN: 12.1 g/dL — AB (ref 13.0–18.0)
MCH: 30.8 pg (ref 26.0–34.0)
MCHC: 34.3 g/dL (ref 32.0–36.0)
MCV: 89.6 fL (ref 80.0–100.0)
PLATELETS: 178 10*3/uL (ref 150–440)
RBC: 3.94 MIL/uL — AB (ref 4.40–5.90)
RDW: 16.1 % — ABNORMAL HIGH (ref 11.5–14.5)
WBC: 9.4 10*3/uL (ref 3.8–10.6)

## 2015-11-10 LAB — BASIC METABOLIC PANEL
ANION GAP: 5 (ref 5–15)
BUN: 15 mg/dL (ref 6–20)
CALCIUM: 8.7 mg/dL — AB (ref 8.9–10.3)
CO2: 26 mmol/L (ref 22–32)
Chloride: 106 mmol/L (ref 101–111)
Creatinine, Ser: 1 mg/dL (ref 0.61–1.24)
GFR calc non Af Amer: >60 mL/min (ref >60)
Glucose, Bld: 140 mg/dL — ABNORMAL HIGH (ref 65–99)
Potassium: 4.4 mmol/L (ref 3.5–5.1)
SODIUM: 137 mmol/L (ref 135–145)

## 2015-11-10 LAB — HEMOGLOBIN A1C
Hgb A1c MFr Bld: 6 % — ABNORMAL HIGH (ref 4.8–5.6)
Mean Plasma Glucose: 126 mg/dL

## 2015-11-10 MED ORDER — LEVOTHYROXINE SODIUM 50 MCG PO TABS
50.0000 ug | ORAL_TABLET | Freq: Once | ORAL | Status: AC
Start: 2015-11-10 — End: 2015-11-10
  Administered 2015-11-10: 50 ug via ORAL
  Filled 2015-11-10: qty 1

## 2015-11-10 MED ORDER — LEVOTHYROXINE SODIUM 100 MCG PO TABS
100.0000 ug | ORAL_TABLET | Freq: Every day | ORAL | Status: DC
  Administered 2015-11-11 – 2015-11-12 (×2): 100 ug via ORAL
  Filled 2015-11-10 (×2): qty 1

## 2015-11-10 NOTE — Plan of Care (Signed)
Problem: Education: Goal: Knowledge of [facility] information/materials will improve Outcome: Progressing Hypotensive during shift, manual BP recheck WDL.  Reports pain on inspiration, no PRN pain meds needed.  IVF infusing.  Bed in low position, bed alarm on.  Call bell within reach, [place].  Problem: Physical Regulation: Goal: Will remain free from infection Outcome: Not Progressing Known PNA

## 2015-11-10 NOTE — Progress Notes (Signed)
Pt did not feel well this morning, but improved during the day. Declined pain med for chronic back pain especially when sitting up. Ambulated in hall to Rm 124 with front wheel walker and tolerated well. Occasional cough producing clear, white with intermittent pink tinge sputum. On eliquis and aspirin therapy. TSH elevated with extra synthroid 50 mcg given. Continued IV antibiotics. Telemetry continued with ventricular pacer rhytmn with HR 90-100's with lanoxin and metoprolol continued. Dynanap does not record accurate HR. SOB on exetion without distress; resolves with rest.

## 2015-11-10 NOTE — Consult Note (Signed)
Pulmonary Critical Care  Initial Consult Note   Russell Howard EPP:[id number] DOB: 1934/06/29 DOA: 11/08/2015  Referring physician: Dr Marcille Blanco PCP: [name], MD   Chief Complaint: Pneumonia  HPI: Russell Howard is a 81 y.o. male with a prior history of Lung Cancer apparently diagnosed in [place]. Patient has received Chemo and RT which was in 2015-2016. In January he was noted to have a pleural effusion which was tapped and cytology was negative. He has noted a gradual deterioration with increased SOB and cough. He was admitted now with a diagnosis of obstructive pneumonia. On his CXR he has significant infiltrate of the right side with possible fluid. I actually reviewed his old films and these show a similar appearance since 2016. He has not had a CT of the chest at this time. Patient does not want aggressive measures at this time.   Review of Systems:  Constitutional:  +weight loss, night sweats, Fevers, chills, +fatigue.  HEENT:  No headaches, nasal congestion, post nasal drip,  Cardio-vascular:  No chest pain, Orthopnea, PND, swelling in lower extremities, anasarca, dizziness, palpitations  GI:  No heartburn, indigestion, abdominal pain, nausea, vomiting, diarrhea  Resp:  +shortness of breath with exertion and at rest. +productive cough, trace blood Skin:  no rash or lesions.  Musculoskeletal:  No joint pain or swelling.   Remainder ROS performed and is unremarkable other than noted in HPI  Past Medical History:  Diagnosis Date  . Acquired hypothyroidism 11/02/2014  . BPH (benign prostatic hyperplasia)   . Cardiac defibrillator in place 11/02/2014  . Cardiomyopathy ([place])   . CHF (congestive heart failure) ([place])   . Chronic diastolic heart failure ([place]) 11/02/2014  . Gastro-esophageal reflux disease without esophagitis 11/02/2014  . H/O malignant neoplasm 11/09/2014  . Heart valve disease 11/02/2014  . History of atrial fibrillation 11/02/2014  . Lung cancer ([place])   .  Neuropathy ([place]) 11/02/2014  . Orthostasis 11/02/2014  . Pure hypercholesterolemia 11/02/2014  . Valvular heart disease    Past Surgical History:  Procedure Laterality Date  . APPENDECTOMY    . DUAL ICD IMPLANT  2007/2009   implantable cardiac defibrillator  . PORT-A-CATH REMOVAL     Social History:  reports that he quit smoking about 33 years ago. His smoking use included Cigarettes. He has never used smokeless tobacco. He reports that he does not drink alcohol or use drugs.  Allergies  Allergen Reactions  . Penicillin G Hives    Has patient had a PCN reaction causing immediate rash, facial/tongue/throat swelling, SOB or lightheadedness with hypotension: Yes Has patient had a PCN reaction causing severe rash involving mucus membranes or skin necrosis: No Has patient had a PCN reaction that required hospitalization No Has patient had a PCN reaction occurring within the last 10 years: No If all of the above answers are "NO", then may proceed with Cephalosporin use.  . Sulfa Antibiotics Rash    Family History  Problem Relation Age of Onset  . Hypertension    . Heart disease      Prior to Admission medications   Medication Sig Start Date End Date Taking? Authorizing Provider  acidophilus (RISAQUAD) CAPS capsule Take 1 capsule by mouth daily.   Yes Historical Provider, MD  apixaban (ELIQUIS) 5 MG TABS tablet Take 1 tablet (5 mg total) by mouth 2 (two) times daily. 06/24/15  Yes Epifanio Lesches, MD  Ascorbic Acid (VITAMIN C) 1000 MG tablet Take 500 mg by mouth 3 (three) times daily. Reported on 06/14/2015  Yes Historical Provider, MD  aspirin EC 81 MG tablet Take 81 mg by mouth daily.    Yes Historical Provider, MD  Cholecalciferol 5000 units TABS Take 5,000 Units by mouth every morning.   Yes Historical Provider, MD  Coenzyme Q10 (TH CO Q-10) 100 MG capsule Take 100 mg by mouth daily.    Yes Historical Provider, MD  digoxin (LANOXIN) 0.125 MG tablet Take 0.125 mg by mouth every  morning.  05/10/15  Yes Historical Provider, MD  furosemide (LASIX) 20 MG tablet Take 20 mg by mouth every other day as needed (as needed for water retention).  05/10/15  Yes Historical Provider, MD  gabapentin (NEURONTIN) 300 MG capsule Take 300 mg by mouth 3 (three) times daily.  12/20/14  Yes Historical Provider, MD  Glucosamine-Chondroit-Vit C-Mn (GLUCOSAMINE-CHONDROITIN) CAPS Take 1 capsule by mouth 2 (two) times daily.    Yes Historical Provider, MD  hydrocortisone (CORTEF) 10 MG tablet Take by mouth 2 (two) times daily.  04/10/15  Yes Historical Provider, MD  levothyroxine (SYNTHROID, LEVOTHROID) 50 MCG tablet Take 50 mcg by mouth daily before breakfast.   Yes Historical Provider, MD  methenamine (HIPREX) 1 g tablet TAKE 1 TABLET(1 GRAM) BY MOUTH TWICE DAILY WITH A MEAL 10/29/15  Yes Shannon A McGowan, PA-C  metoprolol succinate (TOPROL XL) 25 MG 24 hr tablet Take 1 tablet (25 mg total) by mouth daily. 09/07/15 09/06/16 Yes Rudene Re, MD  midodrine (PROAMATINE) 2.5 MG tablet Take 2.5 mg by mouth 3 (three) times daily.   Yes Historical Provider, MD  pantoprazole (PROTONIX) 40 MG tablet Take 40 mg by mouth daily before breakfast.    Yes Historical Provider, MD  potassium chloride (MICRO-K) 10 MEQ CR capsule Take 10 mEq by mouth every other day.    Yes Historical Provider, MD  pyridostigmine (MESTINON) 60 MG tablet Take 60 mg by mouth 2 (two) times daily.  05/07/15  Yes Historical Provider, MD  ranitidine (ZANTAC) 300 MG tablet Take 300 mg by mouth at bedtime.   Yes Historical Provider, MD  SAW PALMETTO-PUMPKIN SEED OIL PO Take 1 capsule by mouth daily.    Yes Historical Provider, MD  silodosin (RAPAFLO) 8 MG CAPS capsule Take 1 capsule (8 mg total) by mouth daily with breakfast. 06/25/15  Yes Shannon A McGowan, PA-C  simvastatin (ZOCOR) 20 MG tablet Take 20 mg by mouth daily at 6 PM.    Yes Historical Provider, MD  vitamin B-12 (CYANOCOBALAMIN) 500 MCG tablet Take 500 mcg by mouth daily.    Yes  Historical Provider, MD  vitamin E 400 UNIT capsule Take 400 Units by mouth daily.    Yes Historical Provider, MD   Physical Exam: Vitals:   11/10/15 0424 11/10/15 1002 11/10/15 1101 11/10/15 1408  BP: 118/64 107/62  (!) 90/53  Pulse:   (!) 101 (!) 109  Resp:  20  18  Temp:  98.1 F (36.7 C)  98 F (36.7 C)  TempSrc:  Oral  Oral  SpO2:    94%  Weight:      Height:        Wt Readings from Last 3 Encounters:  11/09/15 79.7 kg (175 lb 9.6 oz)  09/12/15 76.2 kg (168 lb)  09/07/15 71.7 kg (158 lb)    General:  Appears calm and comfortable Eyes: PERRL, normal lids, irises & conjunctiva ENT: grossly normal hearing, lips & tongue Neck: no LAD, masses or thyromegaly Cardiovascular: RRR, no m/r/g. No LE edema. Respiratory: Normal respiratory effort diminished on  right side no rhonchi left side. Abdomen: soft, nontender Skin: no rash or induration seen on limited exam Musculoskeletal: grossly normal tone BUE/BLE Psychiatric: grossly flat affect Neurologic: grossly non-focal.          Labs on Admission:  Basic Metabolic Panel:  Recent Labs Lab 11/08/15 1703 11/10/15 0419  NA 137 137  K 4.7 4.4  CL 102 106  CO2 28 26  GLUCOSE 111* 140*  BUN 20 15  CREATININE 1.04 1.00  CALCIUM 9.5 8.7*  MG 2.0  --    Liver Function Tests: No results for input(s): AST, ALT, ALKPHOS, BILITOT, PROT, ALBUMIN in the last 168 hours. No results for input(s): LIPASE, AMYLASE in the last 168 hours. No results for input(s): AMMONIA in the last 168 hours. CBC:  Recent Labs Lab 11/08/15 1703 11/10/15 0419  WBC 12.3* 9.4  HGB 14.4 12.1*  HCT 44.7 35.3*  MCV 91.2 89.6  PLT 211 178   Cardiac Enzymes:  Recent Labs Lab 11/08/15 1703  TROPONINI <0.03    BNP (last 3 results)  Recent Labs  09/07/15 1517  BNP 91.0    ProBNP (last 3 results) No results for input(s): PROBNP in the last 8760 hours.  CBG: No results for input(s): GLUCAP in the last 168 hours.  Radiological Exams  on Admission: Dg Chest 2 View  Result Date: 11/08/2015 CLINICAL DATA:  Coughing.  Hemoptysis.  Leukocytosis. EXAM: CHEST  2 VIEW COMPARISON:  Chest CT 09/07/2015 and chest radiograph 09/07/2015 FINDINGS: Left chest wall 3 lead pacemaker/ AICD is in unchanged position with leads in the expected location. Prosthetic aortic valve is unchanged. There is again extensive volume loss of the right hemi thorax with large consolidation and pleural effusion. The left lung remains relatively clear with mild within the inferior left parahilar opacity. No pneumothorax. There is little change compared to the chest radiograph of 09/07/2015. IMPRESSION: Little interval change compared to 09/07/2015 chest radiograph with persistent large consolidation within the right lung and medium-sized a large right pleural effusion. Electronically Signed   By: Ulyses Jarred M.D.   On: 11/08/2015 17:53    EKG: Independently reviewed.  Assessment/Plan Active Problems:   Sepsis ([place])   1. Possible post obstructive pneumonia -on review of the older films there does not appear to be an overall worsening of the appearance -I suspect that there may be a bit of fluid which has recurred and a CT scan may be of help. If there is significant fluid would consider a thoracentesis -I do not think a bronchoscopy would be of advantage at this time. -currently on vancocin would continue -also on maxipeme  2. History of Lung Cancer -s/p treatment would refer to oncology for further suggestions -if there is fluid may need thoracentesis  3. Sepsis -clinically improving on antibiotics -currently hemodynamically stable    Code Status: DNR Family Communication: none Disposition Plan: home ??hospice  Time spent: 71mn    I have personally obtained a history, examined the patient, evaluated laboratory and imaging results, formulated the assessment and plan and placed orders.  The Patient requires high complexity decision making  for assessment and support.    SAllyne Gee MD FMau[facility] Memorial Medical CenterPulmonary Critical Care Medicine Sleep Medicine

## 2015-11-10 NOTE — Progress Notes (Signed)
[place] [facility] [place] NAME: Russell Howard    MR#:  [id number]  DATE OF BIRTH:  1934-12-17  SUBJECTIVE: Admitted for community-acquired pneumonia. Patient has history of right pleural effusion, lung cancer.  CHIEF COMPLAINT:   Chief Complaint  Patient presents with  . Cough  . Hemoptysis    REVIEW OF SYSTEMS:    Review of Systems  Constitutional: Positive for fever and malaise/fatigue. Negative for chills.  HENT: Negative for hearing loss.   Eyes: Negative for blurred vision, double vision and photophobia.  Respiratory: Positive for cough and shortness of breath. Negative for hemoptysis.   Cardiovascular: Negative for palpitations, orthopnea and leg swelling.  Gastrointestinal: Negative for abdominal pain, diarrhea and vomiting.  Genitourinary: Negative for dysuria and urgency.  Musculoskeletal: Negative for myalgias and neck pain.  Skin: Negative for rash.  Neurological: Positive for weakness. Negative for dizziness, focal weakness, seizures and headaches.  Psychiatric/Behavioral: Negative for memory loss. The patient does not have insomnia.     Nutrition:  Tolerating Diet:no Tolerating PT:      DRUG ALLERGIES:   Allergies  Allergen Reactions  . Penicillin G Hives    Has patient had a PCN reaction causing immediate rash, facial/tongue/throat swelling, SOB or lightheadedness with hypotension: Yes Has patient had a PCN reaction causing severe rash involving mucus membranes or skin necrosis: No Has patient had a PCN reaction that required hospitalization No Has patient had a PCN reaction occurring within the last 10 years: No If all of the above answers are "NO", then may proceed with Cephalosporin use.  . Sulfa Antibiotics Rash    VITALS:  Blood pressure 107/62, pulse (!) 101, temperature 98.1 F (36.7 C), temperature source Oral, resp. rate 20, height '5\' 10"'$  (1.778 m), weight 79.7 kg (175 lb 9.6 oz), SpO2 93  %.  PHYSICAL EXAMINATION:   Physical Exam  GENERAL:  81 y.o.-year-old patient lying in the bed with no acute distress.  EYES: Pupils equal, round, reactive to light and accommodation. No scleral icterus. Extraocular muscles intact.  HEENT: Head atraumatic, normocephalic. Oropharynx and nasopharynx clear.  NECK:  Supple, no jugular venous distention. No thyroid enlargement, no tenderness.  LUNGS: Decreased breath sounds on the right side, no wheeze, no rales.no crepitation. No use of accessory muscles of respiration.  CARDIOVASCULAR: S1, S2 normal. No murmurs, rubs, or gallops.  ABDOMEN: Soft, nontender, nondistended. Bowel sounds present. No organomegaly or mass.  EXTREMITIES: No pedal edema, cyanosis, or clubbing.  NEUROLOGIC: Cranial nerves II through XII are intact. Muscle strength 5/5 in all extremities. Sensation intact. Gait not checked.  PSYCHIATRIC: The patient is alert and oriented x 3.  SKIN: No obvious rash, lesion, or ulcer.    LABORATORY PANEL:   CBC  Recent Labs Lab 11/10/15 0419  WBC 9.4  HGB 12.1*  HCT 35.3*  PLT 178   ------------------------------------------------------------------------------------------------------------------  Chemistries   Recent Labs Lab 11/08/15 1703 11/10/15 0419  NA 137 137  K 4.7 4.4  CL 102 106  CO2 28 26  GLUCOSE 111* 140*  BUN 20 15  CREATININE 1.04 1.00  CALCIUM 9.5 8.7*  MG 2.0  --    ------------------------------------------------------------------------------------------------------------------  Cardiac Enzymes  Recent Labs Lab 11/08/15 1703  TROPONINI <0.03   ------------------------------------------------------------------------------------------------------------------  RADIOLOGY:  Dg Chest 2 View  Result Date: 11/08/2015 CLINICAL DATA:  Coughing.  Hemoptysis.  Leukocytosis. EXAM: CHEST  2 VIEW COMPARISON:  Chest CT 09/07/2015 and chest radiograph 09/07/2015 FINDINGS: Left chest wall 3  lead  pacemaker/ AICD is in unchanged position with leads in the expected location. Prosthetic aortic valve is unchanged. There is again extensive volume loss of the right hemi thorax with large consolidation and pleural effusion. The left lung remains relatively clear with mild within the inferior left parahilar opacity. No pneumothorax. There is little change compared to the chest radiograph of 09/07/2015. IMPRESSION: Little interval change compared to 09/07/2015 chest radiograph with persistent large consolidation within the right lung and medium-sized a large right pleural effusion. Electronically Signed   By: Ulyses Jarred M.D.   On: 11/08/2015 17:53     ASSESSMENT AND PLAN:   Active Problems:   Sepsis ([place])   1.Sepsis secondary to community-acquired pneumonia: /staphylococcal UTI:The second resistant UTI. He is also on vancomycin, cefepime. Continue vancomycin, patient says that he feels better than yesterday. No shortness of breath. Nausea.  #2. Chronic atrial fibrillation: Rate controlled. Continue full dose anticoagulation. Continue metoprolol succinate 25 mg daily. Also digoxin 0.125 mg every morning. 3.History of lung cancer, right pleural effusion: Pulmonary consult requested  For the same. #4. Hypothyroidism continue Synthroid., Elevated TSH: Adjust the dose of thyroid. #4 history  Of  chronic diastolic heart failure.. On furosemide 20 mg every 48 hours. #6 BPH continue Flomax neck and  #7 history of myasthenia gravis. Continue pyridostigmine.  Chronic faigue,, weakness, dyspnea on exertion     All the records are reviewed and case discussed with Care Management/Social Workerr. Management plans discussed with the patient, family and they are in agreement.  CODE STATUS: dnr  TOTAL TIME TAKING CARE OF THIS PATIENT: 67mnutes.   POSSIBLE D/C IN 1-2 DAYS, DEPENDING ON CLINICAL CONDITION.   KEpifanio LeschesM.D on 11/10/2015 at 10:19 AM  Between 7am to 6pm - Pager -  [phone]  After 6pm go to [email or web address] - password [email or web address]  Office  [phone] CC: Primary care physician; SIdelle Crouch MD

## 2015-11-10 NOTE — Progress Notes (Signed)
Dr. Marcille Blanco paged re: K 3.0.

## 2015-11-11 LAB — VANCOMYCIN, TROUGH: Vancomycin Tr: 14 ug/mL — ABNORMAL LOW (ref 15–20)

## 2015-11-11 NOTE — Progress Notes (Signed)
General malaise with nausea. Zofran given with decreased nausea. Less active today. MD advised pt he has right pleural effusion. Denies pain. IV antibiotics continued.

## 2015-11-11 NOTE — Progress Notes (Signed)
Pharmacy Antibiotic Note  Russell Howard is a 81 y.o. male admitted on 11/08/2015 with pneumonia and staph UTI.  Pharmacy has been consulted for vancomycin and cefepime dosing.  Plan: 10/8  1130 Vanc trough 14, prior to 4th regimen dose.  Will continue Vancomycin 1250 mg q 18 hours. Plan for trough repeat after 3 more doses.   Continue Cefepime 2 grams q 8 hours ordered.  Height: '5\' 10"'$  (177.8 cm) Weight: 176 lb 6 oz (80 kg) IBW/kg (Calculated) : 73  Temp (24hrs), Avg:97.9 F (36.6 C), Min:97.8 F (36.6 C), Max:98 F (36.7 C)   Recent Labs Lab 11/08/15 1703 11/08/15 2249 11/09/15 0148 11/10/15 0419 11/11/15 1118  WBC 12.3*  --   --  9.4  --   CREATININE 1.04  --   --  1.00  --   LATICACIDVEN  --  2.2* 2.0*  --   --   VANCOTROUGH  --   --   --   --  14*    Estimated Creatinine Clearance: 62.9 mL/min (by C-G formula based on SCr of 1 mg/dL).    Allergies  Allergen Reactions  . Penicillin G Hives    Has patient had a PCN reaction causing immediate rash, facial/tongue/throat swelling, SOB or lightheadedness with hypotension: Yes Has patient had a PCN reaction causing severe rash involving mucus membranes or skin necrosis: No Has patient had a PCN reaction that required hospitalization No Has patient had a PCN reaction occurring within the last 10 years: No If all of the above answers are "NO", then may proceed with Cephalosporin use.  . Sulfa Antibiotics Rash    Antimicrobials this admission: vancomycin  >>  cefepime  >>   Dose adjustments this admission:   Microbiology results: 10/4 BCx: pending 5/12 MRSA PCR: (+)    10.4 CXR:  Large right consolidation  Thank you for allowing pharmacy to be a part of this patient's care.  Amado Andal M Korver Graybeal 11/11/2015 12:20 PM

## 2015-11-11 NOTE — Progress Notes (Signed)
Pt reports " no energy"'; generalized weakness. Denies co's pain; ate well at breakfast. Orthostatic BP with sitting on side of bed. Metoprolol held with Dr. Vianne Bulls in and made aware. MD agreed with hold and will adjust med.

## 2015-11-11 NOTE — Care Management Important Message (Signed)
Important Message  Patient Details  Name: Russell Howard MRN: [id number] Date of Birth: [birth year]   Medicare Important Message Given:  Yes    Ayline Dingus A, RN 11/11/2015, 12:57 PM

## 2015-11-11 NOTE — Progress Notes (Signed)
[facility] [place] NAME: Russell Howard    MR#:  [id number]  DATE OF BIRTH:  15-Oct-1934  SUBJECTIVE: bp low today. Patient  Has fatigue. Chronic cough.   CHIEF COMPLAINT:   Chief Complaint  Patient presents with  . Cough  . Hemoptysis    REVIEW OF SYSTEMS:    Review of Systems  Constitutional: Positive for malaise/fatigue. Negative for chills and fever.  HENT: Negative for hearing loss.   Eyes: Negative for blurred vision, double vision and photophobia.  Respiratory: Positive for cough and shortness of breath. Negative for hemoptysis.   Cardiovascular: Negative for palpitations, orthopnea and leg swelling.  Gastrointestinal: Negative for abdominal pain, diarrhea and vomiting.  Genitourinary: Negative for dysuria and urgency.  Musculoskeletal: Negative for myalgias and neck pain.  Skin: Negative for rash.  Neurological: Positive for weakness. Negative for dizziness, focal weakness, seizures and headaches.  Psychiatric/Behavioral: Negative for memory loss. The patient does not have insomnia.     Nutrition:  Tolerating Diet:no Tolerating PT:      DRUG ALLERGIES:   Allergies  Allergen Reactions  . Penicillin G Hives    Has patient had a PCN reaction causing immediate rash, facial/tongue/throat swelling, SOB or lightheadedness with hypotension: Yes Has patient had a PCN reaction causing severe rash involving mucus membranes or skin necrosis: No Has patient had a PCN reaction that required hospitalization No Has patient had a PCN reaction occurring within the last 10 years: No If all of the above answers are "NO", then may proceed with Cephalosporin use.  . Sulfa Antibiotics Rash    VITALS:  Blood pressure (!) 81/49, pulse (!) 116, temperature 97.8 F (36.6 C), temperature source Oral, resp. rate 20, height '5\' 10"'$  (1.778 m), weight 80 kg (176 lb 6 oz), SpO2 96 %.  PHYSICAL EXAMINATION:   Physical Exam  GENERAL:  81  y.o.-year-old patient lying in the bed with no acute distress.  EYES: Pupils equal, round, reactive to light and accommodation. No scleral icterus. Extraocular muscles intact.  HEENT: Head atraumatic, normocephalic. Oropharynx and nasopharynx clear.  NECK:  Supple, no jugular venous distention. No thyroid enlargement, no tenderness.  LUNGS: Decreased breath sounds on the right side, no wheeze, no rales.no crepitation. No use of accessory muscles of respiration.  CARDIOVASCULAR: S1, S2 normal. No murmurs, rubs, or gallops.  ABDOMEN: Soft, nontender, nondistended. Bowel sounds present. No organomegaly or mass.  EXTREMITIES: No pedal edema, cyanosis, or clubbing.  NEUROLOGIC: Cranial nerves II through XII are intact. Muscle strength 5/5 in all extremities. Sensation intact. Gait not checked.  PSYCHIATRIC: The patient is alert and oriented x 3.  SKIN: No obvious rash, lesion, or ulcer.    LABORATORY PANEL:   CBC  Recent Labs Lab 11/10/15 0419  WBC 9.4  HGB 12.1*  HCT 35.3*  PLT 178   ------------------------------------------------------------------------------------------------------------------  Chemistries   Recent Labs Lab 11/08/15 1703 11/10/15 0419  NA 137 137  K 4.7 4.4  CL 102 106  CO2 28 26  GLUCOSE 111* 140*  BUN 20 15  CREATININE 1.04 1.00  CALCIUM 9.5 8.7*  MG 2.0  --    ------------------------------------------------------------------------------------------------------------------  Cardiac Enzymes  Recent Labs Lab 11/08/15 1703  TROPONINI <0.03   ------------------------------------------------------------------------------------------------------------------  RADIOLOGY:  Ct Chest Wo Contrast  Result Date: 11/10/2015 CLINICAL DATA:  History of lung cancer. Recently diagnosed and treated for pneumonia. EXAM: CT CHEST WITHOUT CONTRAST TECHNIQUE: Multidetector CT imaging of the chest was performed following the  standard protocol without IV contrast.  COMPARISON:  Chest CT 09/07/2015 FINDINGS: Cardiovascular: The heart is stably enlarged. There is no significant pericardial effusion. Dual lead cardiac pacemaker is seen. Calcific atherosclerotic disease of otherwise normal in caliber aorta seen. Mediastinum/Nodes: This are persistent abnormal mediastinal lymph nodes, most prominent in right pretracheal and precarinal stations, relatively stable from the prior study. Lungs/Pleura: Persistent dense peribronchial and interstitial consolidation in the right upper lobe with prominent bronchiectasis. The masslike consolidation in the right lung apex with associated small cystic changes is also stable. Increasing paraseptal and perifissural interstitial opacities in the left lung, particularly in the left upper lobe. Upper Abdomen: Moderate in size hiatal hernia.  Cholelithiasis. Musculoskeletal: No chest wall mass or suspicious bone lesions identified. IMPRESSION: Persistent volume loss, prominent fibrosis, bronchiectasis and interstitial infiltrates in the right upper lobe, with similar but less severe changes in the remaining of the right lung parenchyma. Increasing perifissural and peripleural nodular and linear interstitial thickening in the left lung. These findings may represent infectious consolidation superimposed on pre-existing interstitial fibrosis or advancing lymphangitic spread of malignancy. Persistent mediastinal lymphadenopathy. Persistent moderate in size water density right pleural effusion. Moderate in size hiatal hernia. Cholelithiasis. Electronically Signed   By: Fidela Salisbury M.D.   On: 11/10/2015 18:10     ASSESSMENT AND PLAN:   Active Problems:   Sepsis ([place])   1.Sepsis secondary to community-acquired pneumonia:  And UTI  /coagulase negative staph UTI resistant to all the antibiotics except gentamicin and Macrodantin ,continue vancomycin.   #2. Chronic atrial fibrillation: Rate controlled. Continue full dose  anticoagulation.hypotension today., hold the metoprolol today. Annual digoxin for control of heart rate. Likely discharge home with IV antibiotics for UTI I will discuss with ID to decide on what abx  To discharge him home with.  3.History of lung cancer, right pleural effusion: Seen by pulmonary. CT chest was done yesterday showed persistent mediastinal lymphadenopathy, right-sided pleural effusion, bronchiectasis on the right side, possibly advancing lymphangitic spread of malignancy.  #4. Hypothyroidism continue Synthroid., Elevated TSH: Adjust the dose of thyroid.  #4 history  Of  chronic diastolic heart failure.. On furosemide 20 mg every 48 hours.  #6 BPH continue Flomax   #7 history of myasthenia gravis. Continue pyridostigmine.  Chronic faigue,, weakness, dyspnea on exertion DNR D/w RN   All the records are reviewed and case discussed with Care Management/Social Workerr. Management plans discussed with the patient, family and they are in agreement.  CODE STATUS: dnr  TOTAL TIME TAKING CARE OF THIS PATIENT: 66mnutes.   POSSIBLE D/C IN 1-2 DAYS, DEPENDING ON CLINICAL CONDITION.   KEpifanio LeschesM.D on 11/11/2015 at 10:23 AM  Between 7am to 6pm - Pager - [phone]  After 6pm go to [email or web address] - password [email or web address]  Office  [phone] CC: Primary care physician; SIdelle Crouch MD

## 2015-11-12 MED ORDER — DEXTROSE 5 % IV SOLN
2.0000 g | Freq: Three times a day (TID) | INTRAVENOUS | Status: DC
  Filled 2015-11-12 (×3): qty 2

## 2015-11-12 MED ORDER — LEVOFLOXACIN 750 MG PO TABS: 750.0000 mg | ORAL_TABLET | Freq: Every day | ORAL | 0 refills | Status: DC

## 2015-11-12 NOTE — Discharge Instructions (Signed)
Resumed diet and activity as before

## 2015-11-12 NOTE — Progress Notes (Signed)
Pharmacy Antibiotic Note  Beth Cuffe is a 81 y.o. male admitted on 11/08/2015 with pneumonia and staph UTI.  Pharmacy has been consulted for vancomycin and cefepime dosing.  Plan: 10/8  1130 Vanc trough= 14, prior to 4th regimen dose.  Will continue Vancomycin 1250 mg q 18 hours. Plan for trough repeat after 3 more doses on 10/10.   Continue Cefepime 2 grams q 8 hours ordered.    Height: '5\' 10"'$  (177.8 cm) Weight: 174 lb 2 oz (79 kg) IBW/kg (Calculated) : 73  Temp (24hrs), Avg:97.9 F (36.6 C), Min:97.7 F (36.5 C), Max:98.2 F (36.8 C)   Recent Labs Lab 11/08/15 1703 11/08/15 2249 11/09/15 0148 11/10/15 0419 11/11/15 1118  WBC 12.3*  --   --  9.4  --   CREATININE 1.04  --   --  1.00  --   LATICACIDVEN  --  2.2* 2.0*  --   --   VANCOTROUGH  --   --   --   --  14*    Estimated Creatinine Clearance: 62.9 mL/min (by C-G formula based on SCr of 1 mg/dL).    Allergies  Allergen Reactions  . Penicillin G Hives    Has patient had a PCN reaction causing immediate rash, facial/tongue/throat swelling, SOB or lightheadedness with hypotension: Yes Has patient had a PCN reaction causing severe rash involving mucus membranes or skin necrosis: No Has patient had a PCN reaction that required hospitalization No Has patient had a PCN reaction occurring within the last 10 years: No If all of the above answers are "NO", then may proceed with Cephalosporin use.  . Sulfa Antibiotics Rash    Antimicrobials this admission: vancomycin 10/5 >>  cefepime  10/5 >>   Dose adjustments this admission:   Microbiology results: 10/4 BCx: pending 5/12 MRSA PCR: (+)  10.4 CXR:  Large right consolidation  Thank you for allowing pharmacy to be a part of this patient's care.  Garnetta Fedrick A 11/12/2015 8:23 AM

## 2015-11-13 LAB — CULTURE, BLOOD (ROUTINE X 2)
CULTURE: NO GROWTH
CULTURE: NO GROWTH

## 2015-11-13 NOTE — Discharge Summary (Signed)
[facility] [place] NAME: Russell Howard    MR#:  [id number]  DATE OF BIRTH:  [birth year]  DATE OF ADMISSION:  11/08/2015 ADMITTING PHYSICIAN: Harrie Foreman, MD  DATE OF DISCHARGE: 11/12/2015  2:06 PM  PRIMARY CARE PHYSICIAN: SPARKS,JEFFREY D, MD   ADMISSION DIAGNOSIS:  Sepsis, due to unspecified organism ([place]) [A41.9] Community acquired pneumonia of right lung, unspecified part of lung [J18.9]  DISCHARGE DIAGNOSIS:  Active Problems:   Sepsis ([place])   SECONDARY DIAGNOSIS:   Past Medical History:  Diagnosis Date  . Acquired hypothyroidism 11/02/2014  . BPH (benign prostatic hyperplasia)   . Cardiac defibrillator in place 11/02/2014  . Cardiomyopathy ([place])   . CHF (congestive heart failure) ([place])   . Chronic diastolic heart failure ([place]) 11/02/2014  . Gastro-esophageal reflux disease without esophagitis 11/02/2014  . H/O malignant neoplasm 11/09/2014  . Heart valve disease 11/02/2014  . History of atrial fibrillation 11/02/2014  . Lung cancer ([place])   . Neuropathy ([place]) 11/02/2014  . Orthostasis 11/02/2014  . Pure hypercholesterolemia 11/02/2014  . Valvular heart disease      ADMITTING HISTORY  Chief Complaint: Cough HPI: The patient with past medical history of diastolic congestive heart failure as well as lung cancer presents emergency department complaining of cough. The patient states that he is always mildly short of breath at baseline but has become dramatically more so today. He has had a cough since Monday. He also complains of general malaise for the last 4 days. Denies fevers or vomiting but his cough is productive of thickened yellow sputum. On arrival to the emergency department the patient's oxygen saturations were normal on room air. Chest x-ray revealed moderately sized right sided effusion is well as lower lobe consolidation. This is virtually unchanged in appearance from x-ray 2 months ago. Notably, the patient has  had a loculated effusion in the past that was serous inflammatory fluid as well as chylous on thoracentesis performed in January 2017. The patient is afebrile and his heart rate is increased and laboratory evaluation revealed leukocytosis which prompted the emergency department staff to admit for further evaluation.  HOSPITAL COURSE:   1.Sepsis secondary to community-acquired pneumonia Improved. SOB returned to baseline. Afebrile now. Changed to levaquin PO at discharge for 1 week with oncology and PCP f/u Seen by Dr. Freddie Breech) during hospital stay  #2. Chronic atrial fibrillation: Rate controlled. Continue meds  3.History of lung cancer, right pleural effusion: Seen by pulmonary. CT chest was done yesterday showed persistent mediastinal lymphadenopathy, right-sided pleural effusion, bronchiectasis on the right side, possibly advancing lymphangitic spread of malignancy. He has follow up with Dr. Rogue Bussing with oncology. Will need repeat CT chest for f/u  #4. Hypothyroidism continue Synthroid  #4 history  Of  chronic diastolic heart failure.. On furosemide  #6 BPH continue Flomax   #7 history of myasthenia gravis. Continue pyridostigmine.  Chronic faigue,, weakness, dyspnea on exertion  Stable for discharge home  CONSULTS OBTAINED:  Treatment Team:  Allyne Gee, MD  DRUG ALLERGIES:   Allergies  Allergen Reactions  . Penicillin G Hives    Has patient had a PCN reaction causing immediate rash, facial/tongue/throat swelling, SOB or lightheadedness with hypotension: Yes Has patient had a PCN reaction causing severe rash involving mucus membranes or skin necrosis: No Has patient had a PCN reaction that required hospitalization No Has patient had a PCN reaction occurring within the last 10 years: No If all of the above answers are "  NO", then may proceed with Cephalosporin use.  . Sulfa Antibiotics Rash    DISCHARGE MEDICATIONS:   Discharge Medication List as of  11/12/2015  1:16 PM    START taking these medications   Details  levofloxacin (LEVAQUIN) 750 MG tablet Take 1 tablet (750 mg total) by mouth daily., Starting Mon 11/12/2015, Normal      CONTINUE these medications which have NOT CHANGED   Details  acidophilus (RISAQUAD) CAPS capsule Take 1 capsule by mouth daily., Until Discontinued, Historical Med    apixaban (ELIQUIS) 5 MG TABS tablet Take 1 tablet (5 mg total) by mouth 2 (two) times daily., Starting 06/24/2015, Until Discontinued, Normal    Ascorbic Acid (VITAMIN C) 1000 MG tablet Take 500 mg by mouth 3 (three) times daily. Reported on 06/14/2015, Until Discontinued, Historical Med    aspirin EC 81 MG tablet Take 81 mg by mouth daily. , Until Discontinued, Historical Med    Cholecalciferol 5000 units TABS Take 5,000 Units by mouth every morning., Until Discontinued, Historical Med    Coenzyme Q10 (TH CO Q-10) 100 MG capsule Take 100 mg by mouth daily. , Until Discontinued, Historical Med    digoxin (LANOXIN) 0.125 MG tablet Take 0.125 mg by mouth every morning. , Starting 05/10/2015, Until Discontinued, Historical Med    furosemide (LASIX) 20 MG tablet Take 20 mg by mouth every other day as needed (as needed for water retention). , Starting 05/10/2015, Until Discontinued, Historical Med    gabapentin (NEURONTIN) 300 MG capsule Take 300 mg by mouth 3 (three) times daily. , Starting 12/20/2014, Until Discontinued, Historical Med    Glucosamine-Chondroit-Vit C-Mn (GLUCOSAMINE-CHONDROITIN) CAPS Take 1 capsule by mouth 2 (two) times daily. , Until Discontinued, Historical Med    hydrocortisone (CORTEF) 10 MG tablet Take by mouth 2 (two) times daily. , Starting 04/10/2015, Until Discontinued, Historical Med    levothyroxine (SYNTHROID, LEVOTHROID) 50 MCG tablet Take 50 mcg by mouth daily before breakfast., Until Discontinued, Historical Med    methenamine (HIPREX) 1 g tablet TAKE 1 TABLET(1 GRAM) BY MOUTH TWICE DAILY WITH A MEAL, Normal     metoprolol succinate (TOPROL XL) 25 MG 24 hr tablet Take 1 tablet (25 mg total) by mouth daily., Starting Fri 09/07/2015, Until Sat 09/06/2016, Print    midodrine (PROAMATINE) 2.5 MG tablet Take 2.5 mg by mouth 3 (three) times daily., Historical Med    pantoprazole (PROTONIX) 40 MG tablet Take 40 mg by mouth daily before breakfast. , Until Discontinued, Historical Med    potassium chloride (MICRO-K) 10 MEQ CR capsule Take 10 mEq by mouth every other day. , Until Discontinued, Historical Med    pyridostigmine (MESTINON) 60 MG tablet Take 60 mg by mouth 2 (two) times daily. , Starting 05/07/2015, Until Discontinued, Historical Med    ranitidine (ZANTAC) 300 MG tablet Take 300 mg by mouth at bedtime., Historical Med    SAW PALMETTO-PUMPKIN SEED OIL PO Take 1 capsule by mouth daily. , Until Discontinued, Historical Med    silodosin (RAPAFLO) 8 MG CAPS capsule Take 1 capsule (8 mg total) by mouth daily with breakfast., Starting Mon 06/25/2015, Fax    simvastatin (ZOCOR) 20 MG tablet Take 20 mg by mouth daily at 6 PM. , Until Discontinued, Historical Med    vitamin B-12 (CYANOCOBALAMIN) 500 MCG tablet Take 500 mcg by mouth daily. , Until Discontinued, Historical Med    vitamin E 400 UNIT capsule Take 400 Units by mouth daily. , Until Discontinued, Historical Med  Today   VITAL SIGNS:  Blood pressure (!) 114/48, pulse 95, temperature 97.7 F (36.5 C), temperature source Oral, resp. rate 16, height '5\' 10"'$  (1.778 m), weight 79 kg (174 lb 2 oz), SpO2 97 %.  I/O:  No intake or output data in the 24 hours ending 11/13/15 1611  PHYSICAL EXAMINATION:  Physical Exam  GENERAL:  81 y.o.-year-old patient lying in the bed with no acute distress.  LUNGS: Normal breath sounds bilaterally, no wheezing, rales,rhonchi or crepitation. No use of accessory muscles of respiration.  CARDIOVASCULAR: S1, S2 normal. No murmurs, rubs, or gallops.  ABDOMEN: Soft, non-tender, non-distended. Bowel sounds  present. No organomegaly or mass.  NEUROLOGIC: Moves all 4 extremities. PSYCHIATRIC: The patient is alert and oriented x 3.  SKIN: No obvious rash, lesion, or ulcer.   DATA REVIEW:   CBC  Recent Labs Lab 11/10/15 0419  WBC 9.4  HGB 12.1*  HCT 35.3*  PLT 178    Chemistries   Recent Labs Lab 11/08/15 1703 11/10/15 0419  NA 137 137  K 4.7 4.4  CL 102 106  CO2 28 26  GLUCOSE 111* 140*  BUN 20 15  CREATININE 1.04 1.00  CALCIUM 9.5 8.7*  MG 2.0  --     Cardiac Enzymes  Recent Labs Lab 11/08/15 1703  TROPONINI <0.03    Microbiology Results  Results for orders placed or performed during the hospital encounter of 11/08/15  Blood culture (routine x 2)     Status: None   Collection Time: 11/08/15 10:49 PM  Result Value Ref Range Status   Specimen Description BLOOD LEFT WRIST  Final   Special Requests BOTTLES DRAWN AEROBIC AND ANAEROBIC 3ML  Final   Culture NO GROWTH 5 DAYS  Final   Report Status 11/13/2015 FINAL  Final  Blood culture (routine x 2)     Status: None   Collection Time: 11/08/15 10:49 PM  Result Value Ref Range Status   Specimen Description BLOOD LEFT FOREARM  Final   Special Requests BOTTLES DRAWN AEROBIC AND ANAEROBIC 2ML  Final   Culture NO GROWTH 5 DAYS  Final   Report Status 11/13/2015 FINAL  Final  MRSA PCR Screening     Status: Abnormal   Collection Time: 11/09/15  3:30 AM  Result Value Ref Range Status   MRSA by PCR POSITIVE (A) NEGATIVE Final    Comment:        The GeneXpert MRSA Assay (FDA approved for NASAL specimens only), is one component of a comprehensive MRSA colonization surveillance program. It is not intended to diagnose MRSA infection nor to guide or monitor treatment for MRSA infections. RESULT CALLED TO, READ BACK BY AND VERIFIED WITH: Tomasa Hosteller @ 1191 11/09/15 by [place]:  No results found.  Follow up with PCP in 1 week.  Management plans discussed with the patient, family and they are in  agreement.  CODE STATUS:  Code Status History    Date Active Date Inactive Code Status Order ID Comments User Context   11/09/2015  3:18 AM 11/12/2015  5:13 PM DNR [id number]  Harrie Foreman, MD Inpatient   06/14/2015  4:39 PM 06/18/2015  3:13 PM DNR [id number]  Demetrios Loll, MD Inpatient    Questions for Most Recent Historical Code Status (Order [id number])    Question Answer Comment   In the event of cardiac or respiratory ARREST Do not call a "code blue"    In the event of cardiac or respiratory ARREST  Do not perform Intubation, CPR, defibrillation or ACLS    In the event of cardiac or respiratory ARREST Use medication by any route, position, wound care, and other measures to relive pain and suffering. May use oxygen, suction and manual treatment of airway obstruction as needed for comfort.       TOTAL TIME TAKING CARE OF THIS PATIENT ON DAY OF DISCHARGE: more than 30 minutes.   Hillary Bow R M.D on 11/13/2015 at 4:11 PM  Between 7am to 6pm - Pager - [phone]  After 6pm go to [email or web address] - password [email or web address] [facility]  Office  [phone]  CC: Primary care physician; [name], MD  Note: This dictation was prepared with Dragon dictation along with smaller phrase technology. Any transcriptional errors that result from this process are unintentional.

## 2015-11-19 ENCOUNTER — Inpatient Hospital Stay: Payer: Medicare Other | Admitting: Internal Medicine

## 2015-11-29 ENCOUNTER — Inpatient Hospital Stay: Payer: Medicare Other | Attending: Internal Medicine | Admitting: Internal Medicine

## 2015-11-29 DIAGNOSIS — E7800 Pure hypercholesterolemia, unspecified: Secondary | ICD-10-CM | POA: Insufficient documentation

## 2015-11-29 DIAGNOSIS — Z7982 Long term (current) use of aspirin: Secondary | ICD-10-CM | POA: Insufficient documentation

## 2015-11-29 DIAGNOSIS — E039 Hypothyroidism, unspecified: Secondary | ICD-10-CM | POA: Diagnosis not present

## 2015-11-29 DIAGNOSIS — Z87891 Personal history of nicotine dependence: Secondary | ICD-10-CM | POA: Diagnosis not present

## 2015-11-29 DIAGNOSIS — C3431 Malignant neoplasm of lower lobe, right bronchus or lung: Secondary | ICD-10-CM | POA: Insufficient documentation

## 2015-11-29 DIAGNOSIS — Z923 Personal history of irradiation: Secondary | ICD-10-CM | POA: Diagnosis not present

## 2015-11-29 DIAGNOSIS — I4891 Unspecified atrial fibrillation: Secondary | ICD-10-CM | POA: Diagnosis not present

## 2015-11-29 DIAGNOSIS — I429 Cardiomyopathy, unspecified: Secondary | ICD-10-CM | POA: Insufficient documentation

## 2015-11-29 DIAGNOSIS — N400 Enlarged prostate without lower urinary tract symptoms: Secondary | ICD-10-CM | POA: Insufficient documentation

## 2015-11-29 DIAGNOSIS — I2510 Atherosclerotic heart disease of native coronary artery without angina pectoris: Secondary | ICD-10-CM | POA: Insufficient documentation

## 2015-11-29 DIAGNOSIS — K219 Gastro-esophageal reflux disease without esophagitis: Secondary | ICD-10-CM | POA: Insufficient documentation

## 2015-11-29 DIAGNOSIS — Z7901 Long term (current) use of anticoagulants: Secondary | ICD-10-CM | POA: Insufficient documentation

## 2015-11-29 DIAGNOSIS — Z95810 Presence of automatic (implantable) cardiac defibrillator: Secondary | ICD-10-CM | POA: Diagnosis not present

## 2015-11-29 DIAGNOSIS — I5032 Chronic diastolic (congestive) heart failure: Secondary | ICD-10-CM | POA: Diagnosis not present

## 2015-11-29 DIAGNOSIS — Z9221 Personal history of antineoplastic chemotherapy: Secondary | ICD-10-CM | POA: Diagnosis not present

## 2015-11-29 NOTE — Assessment & Plan Note (Addendum)
#   SQUAMOUS CELL LUNG CANCER- at least stage III/locally advanced status post chemoradiation [finished June 2015 [place]]. PET- January 2017- no obvious evidence of recurrence; however patient declined biopsy of the mass in the right lower lobe. OCT 2017- CT scan stable.  # Long discussion with the patient and his daughter-in-law regarding the overall prognosis is poor in the event of recurrent cancer given his multiple co- morbidities/ poor functional status. Patient has declined further Workups/treatments.   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  # If the atient's clinical status worsens- hospice would be recommended. As such patient is not getting any active recommendations from oncology; he is willing to be discharged from the clinic. He'll follow up with me in the clinic as needed.  # The above plan was discussed with the patient and his daughter. They agree.

## 2015-11-29 NOTE — Progress Notes (Signed)
Patient is here for follow up, no complaints. Here with daughter in law

## 2015-11-29 NOTE — Progress Notes (Signed)
[facility] CONSULT NOTE  Patient Care Team: [name], MD as PCP - General (Internal Medicine)  CHIEF COMPLAINTS/PURPOSE OF CONSULTATION: recurrent Lung cancer  Oncology History   # MAY 2015 SQUAMOUS CELL CA ? STAGE III s/p Chemo-RT  [sep 2015]; [place], [place]; [place]; Poor tol to Chemo; Jan 2017- PET- right sided consolidation/ effusion; no uptake s/o recurrence.   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  male with prior history of lung cancer diagnosed in May 2015- also currently noted to have a right-sided loculated chylous effusion is here for follow-up. Patient was recently admitted the hospital for "pneumonia"; status post IV antibiotics.  Patient continues to chronic difficulty breathing especially on exertion. Denies any worsening cough or hemoptysis. He continues to be in wheelchair because of general debility/ multiple medical problems.  He continues to deny any pain. Appetite is good. Not losing any weight. No swelling in the legs.   ROS: A complete 10 point review of system is done which is negative except mentioned above in history of present illness  MEDICAL HISTORY:  Past Medical History:  Diagnosis Date  . Acquired hypothyroidism 11/02/2014  . BPH (benign prostatic hyperplasia)   . Cardiac defibrillator in place 11/02/2014  . Cardiomyopathy ([place])   . CHF (congestive heart failure) ([place])   . Chronic diastolic heart failure ([place]) 11/02/2014  . Gastro-esophageal reflux disease without esophagitis 11/02/2014  . H/O malignant neoplasm 11/09/2014  . Heart valve disease 11/02/2014  . History of atrial fibrillation 11/02/2014  . Lung cancer ([place])   . Neuropathy  ([place]) 11/02/2014  . Orthostasis 11/02/2014  . Pure hypercholesterolemia 11/02/2014  . Valvular heart disease     SURGICAL HISTORY: Past Surgical History:  Procedure Laterality Date  . APPENDECTOMY    . DUAL ICD IMPLANT  2007/2009   implantable cardiac defibrillator  . PORT-A-CATH REMOVAL      SOCIAL HISTORY: Social History   Social History  . Marital status: Widowed    Spouse name: N/A  . Number of children: N/A  . Years of education: N/A   Occupational History  . Not on file.   Social History Main Topics  . Smoking status: Former Smoker    Types: Cigarettes    Quit date: 11/21/1981  . Smokeless tobacco: Never Used  . Alcohol use No  . Drug use: No  . Sexual activity: Not Currently   Other Topics Concern  . Not on file   Social History Narrative  . No narrative on file    FAMILY HISTORY: Family History  Problem Relation Age of Onset  . Hypertension    . Heart disease      ALLERGIES:  is allergic to penicillin g and sulfa antibiotics.  MEDICATIONS:  Current Outpatient Prescriptions  Medication Sig Dispense Refill  . acidophilus (RISAQUAD) CAPS capsule Take 1 capsule by mouth daily.    Marland Kitchen apixaban (ELIQUIS) 5 MG TABS tablet Take 1 tablet (5 mg total) by mouth 2 (two) times daily. 60 tablet 0  . Ascorbic Acid (VITAMIN C) 1000 MG tablet Take 500 mg by mouth 3 (three) times daily. Reported on 06/14/2015    . aspirin EC 81 MG tablet Take 81 mg by mouth daily.     Marland Kitchen  Cholecalciferol 5000 units TABS Take 5,000 Units by mouth every morning.    . Coenzyme Q10 (TH CO Q-10) 100 MG capsule Take 100 mg by mouth daily.     . digoxin (LANOXIN) 0.125 MG tablet Take 0.125 mg by mouth every morning.     . furosemide (LASIX) 20 MG tablet Take 20 mg by mouth every other day as needed (as needed for water retention).     . gabapentin (NEURONTIN) 300 MG capsule Take 300 mg by mouth 3 (three) times daily.     . Glucosamine-Chondroit-Vit C-Mn (GLUCOSAMINE-CHONDROITIN) CAPS Take 1  capsule by mouth 2 (two) times daily.     . hydrocortisone (CORTEF) 10 MG tablet Take by mouth 3 (three) times daily.     Marland Kitchen levothyroxine (SYNTHROID, LEVOTHROID) 50 MCG tablet Take 50 mcg by mouth daily before breakfast.    . methenamine (HIPREX) 1 g tablet TAKE 1 TABLET(1 GRAM) BY MOUTH TWICE DAILY WITH A MEAL 60 tablet 0  . metoprolol succinate (TOPROL XL) 25 MG 24 hr tablet Take 1 tablet (25 mg total) by mouth daily. 30 tablet 1  . midodrine (PROAMATINE) 2.5 MG tablet Take 2.5 mg by mouth 3 (three) times daily.    . pantoprazole (PROTONIX) 40 MG tablet Take 40 mg by mouth daily before breakfast.     . potassium chloride (MICRO-K) 10 MEQ CR capsule Take 10 mEq by mouth every other day.     . pyridostigmine (MESTINON) 60 MG tablet Take 60 mg by mouth 2 (two) times daily.     . ranitidine (ZANTAC) 300 MG tablet Take 300 mg by mouth at bedtime.    . SAW PALMETTO-PUMPKIN SEED OIL PO Take 1 capsule by mouth daily.     . simvastatin (ZOCOR) 20 MG tablet Take 20 mg by mouth daily at 6 PM.     . vitamin B-12 (CYANOCOBALAMIN) 500 MCG tablet Take 500 mcg by mouth daily.     . vitamin E 400 UNIT capsule Take 400 Units by mouth daily.     Marland Kitchen levofloxacin (LEVAQUIN) 750 MG tablet Take 1 tablet (750 mg total) by mouth daily. (Patient not taking: Reported on 11/29/2015) 7 tablet 0   No current facility-administered medications for this visit.       Marland Kitchen  PHYSICAL EXAMINATION: ECOG PERFORMANCE STATUS: 3 - Symptomatic, >50% confined to bed  Vitals:   11/29/15 1555  BP: 106/68  Pulse: 90  Resp: 18  Temp: 97.6 F (36.4 C)   Filed Weights   11/29/15 1555  Weight: 168 lb (76.2 kg)     GENERAL: Well-nourished well-developed; Alert, no distress and comfortable. Accompanied by his daughter-in-law. He is in a wheelchair.  EYES: no pallor or icterus OROPHARYNX: no thrush or ulceration; good dentition  NECK: supple, no masses felt LYMPH: no palpable lymphadenopathy in the cervical, axillary or  inguinal regions LUNGDecreased breath sounds on the right side. No wheeze or crackles HEART/CVS: regular rate & rhythm and no murmurs; No lower extremity edema ABDOMEN: abdomen soft, non-tender and normal bowel sounds Musculoskeletal:no cyanosis of digits and no clubbing  PSYCH: alert & oriented x 3 with fluent speech NEURO: no focal motor/sensory deficits SKIN: Mild erythema of the right forearm. 2-3 cm in size.   LABORATORY DATA:  I have reviewed the data as listed Lab Results  Component Value Date   WBC 9.4 11/10/2015   HGB 12.1 (L) 11/10/2015   HCT 35.3 (L) 11/10/2015   MCV 89.6 11/10/2015   PLT 178 11/10/2015  Recent Labs  05/11/15 1045 05/18/15 1815 06/14/15 0854  09/07/15 1517 11/08/15 1703 11/10/15 0419  NA 137 133* 135  < > 140 137 137  K 4.2 4.2 4.2  < > 4.1 4.7 4.4  CL 104 100* 103  < > 103 102 106  CO2 '27 25 22  '$ < > '25 28 26  '$ GLUCOSE 87 94 115*  < > 144* 111* 140*  BUN 23* 24* 32*  < > 30* 20 15  CREATININE 1.01 0.91 0.96  < > 1.02 1.04 1.00  CALCIUM 9.4 9.1 9.3  < > 9.7 9.5 8.7*  GFRNONAA >60 >60 >60  < > >60 >60 >60  GFRAA >60 >60 >60  < > >60 >60 >60  PROT 8.6* 7.9 8.1  --   --   --   --   ALBUMIN 3.8 3.5 3.3*  --   --   --   --   AST '20 26 27  '$ --   --   --   --   ALT 14* 13* 15*  --   --   --   --   ALKPHOS 92 77 75  --   --   --   --   BILITOT 0.6 0.7 1.5*  --   --   --   --   < > = values in this interval not displayed.  RADIOGRAPHIC STUDIES: I have personally reviewed the radiological images as listed and agreed with the findings in the report. Dg Chest 2 View  Result Date: 11/08/2015 CLINICAL DATA:  Coughing.  Hemoptysis.  Leukocytosis. EXAM: CHEST  2 VIEW COMPARISON:  Chest CT 09/07/2015 and chest radiograph 09/07/2015 FINDINGS: Left chest wall 3 lead pacemaker/ AICD is in unchanged position with leads in the expected location. Prosthetic aortic valve is unchanged. There is again extensive volume loss of the right hemi thorax with large  consolidation and pleural effusion. The left lung remains relatively clear with mild within the inferior left parahilar opacity. No pneumothorax. There is little change compared to the chest radiograph of 09/07/2015. IMPRESSION: Little interval change compared to 09/07/2015 chest radiograph with persistent large consolidation within the right lung and medium-sized a large right pleural effusion. Electronically Signed   By: Ulyses Jarred M.D.   On: 11/08/2015 17:53   Ct Chest Wo Contrast  Result Date: 11/10/2015 CLINICAL DATA:  History of lung cancer. Recently diagnosed and treated for pneumonia. EXAM: CT CHEST WITHOUT CONTRAST TECHNIQUE: Multidetector CT imaging of the chest was performed following the standard protocol without IV contrast. COMPARISON:  Chest CT 09/07/2015 FINDINGS: Cardiovascular: The heart is stably enlarged. There is no significant pericardial effusion. Dual lead cardiac pacemaker is seen. Calcific atherosclerotic disease of otherwise normal in caliber aorta seen. Mediastinum/Nodes: This are persistent abnormal mediastinal lymph nodes, most prominent in right pretracheal and precarinal stations, relatively stable from the prior study. Lungs/Pleura: Persistent dense peribronchial and interstitial consolidation in the right upper lobe with prominent bronchiectasis. The masslike consolidation in the right lung apex with associated small cystic changes is also stable. Increasing paraseptal and perifissural interstitial opacities in the left lung, particularly in the left upper lobe. Upper Abdomen: Moderate in size hiatal hernia.  Cholelithiasis. Musculoskeletal: No chest wall mass or suspicious bone lesions identified. IMPRESSION: Persistent volume loss, prominent fibrosis, bronchiectasis and interstitial infiltrates in the right upper lobe, with similar but less severe changes in the remaining of the right lung parenchyma. Increasing perifissural and peripleural nodular and linear interstitial  thickening in  the left lung. These findings may represent infectious consolidation superimposed on pre-existing interstitial fibrosis or advancing lymphangitic spread of malignancy. Persistent mediastinal lymphadenopathy. Persistent moderate in size water density right pleural effusion. Moderate in size hiatal hernia. Cholelithiasis. Electronically Signed   By: Fidela Salisbury M.D.   On: 11/10/2015 18:10    ASSESSMENT & PLAN:   Primary cancer of bronchus of right lower lobe ([facility]) # SQUAMOUS CELL LUNG CANCER- at least stage III/locally advanced status post chemoradiation [finished June 2015 [place]]. PET- January 2017- no obvious evidence of recurrence; however patient declined biopsy of the mass in the right lower lobe. OCT 2017- CT scan stable.  # Long discussion with the patient and his daughter-in-law regarding the overall prognosis is poor in the event of recurrent cancer given his multiple co- morbidities/ poor functional status. Patient has declined further Workups/treatments.   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  # If the atient's clinical status worsens- hospice would be recommended. As such patient is not getting any active recommendations from oncology; he is willing to be discharged from the clinic. He'll follow up with me in the clinic as needed.  # The above plan was discussed with the patient and his daughter. They agree.   Cammie Sickle, MD 11/29/2015 6:02 PM

## 2015-12-08 ENCOUNTER — Other Ambulatory Visit: Payer: Self-pay | Admitting: Urology

## 2015-12-08 DIAGNOSIS — N390 Urinary tract infection, site not specified: Secondary | ICD-10-CM

## 2016-01-09 ENCOUNTER — Emergency Department: Payer: Medicare Other

## 2016-01-09 ENCOUNTER — Inpatient Hospital Stay
Admission: EM | Admit: 2016-01-09 | Discharge: 2016-01-14 | DRG: 872 | Disposition: A | Discharge status: Home / Self Care | Payer: Medicare Other | Attending: Internal Medicine | Admitting: Internal Medicine

## 2016-01-09 DIAGNOSIS — E039 Hypothyroidism, unspecified: Secondary | ICD-10-CM | POA: Diagnosis present

## 2016-01-09 DIAGNOSIS — Z87891 Personal history of nicotine dependence: Secondary | ICD-10-CM

## 2016-01-09 DIAGNOSIS — Z515 Encounter for palliative care: Secondary | ICD-10-CM

## 2016-01-09 DIAGNOSIS — R338 Other retention of urine: Secondary | ICD-10-CM | POA: Diagnosis not present

## 2016-01-09 DIAGNOSIS — G7000 Myasthenia gravis without (acute) exacerbation: Secondary | ICD-10-CM | POA: Diagnosis present

## 2016-01-09 DIAGNOSIS — Z7901 Long term (current) use of anticoagulants: Secondary | ICD-10-CM | POA: Diagnosis not present

## 2016-01-09 DIAGNOSIS — R531 Weakness: Secondary | ICD-10-CM

## 2016-01-09 DIAGNOSIS — R05 Cough: Secondary | ICD-10-CM

## 2016-01-09 DIAGNOSIS — J90 Pleural effusion, not elsewhere classified: Secondary | ICD-10-CM | POA: Diagnosis present

## 2016-01-09 DIAGNOSIS — Z882 Allergy status to sulfonamides status: Secondary | ICD-10-CM

## 2016-01-09 DIAGNOSIS — R0682 Tachypnea, not elsewhere classified: Secondary | ICD-10-CM | POA: Diagnosis present

## 2016-01-09 DIAGNOSIS — M6281 Muscle weakness (generalized): Secondary | ICD-10-CM

## 2016-01-09 DIAGNOSIS — G629 Polyneuropathy, unspecified: Secondary | ICD-10-CM | POA: Diagnosis present

## 2016-01-09 DIAGNOSIS — I429 Cardiomyopathy, unspecified: Secondary | ICD-10-CM | POA: Diagnosis present

## 2016-01-09 DIAGNOSIS — B957 Other staphylococcus as the cause of diseases classified elsewhere: Secondary | ICD-10-CM | POA: Diagnosis present

## 2016-01-09 DIAGNOSIS — N131 Hydronephrosis with ureteral stricture, not elsewhere classified: Secondary | ICD-10-CM | POA: Diagnosis not present

## 2016-01-09 DIAGNOSIS — N390 Urinary tract infection, site not specified: Secondary | ICD-10-CM | POA: Diagnosis present

## 2016-01-09 DIAGNOSIS — Z880 Allergy status to penicillin: Secondary | ICD-10-CM

## 2016-01-09 DIAGNOSIS — I110 Hypertensive heart disease with heart failure: Secondary | ICD-10-CM | POA: Diagnosis present

## 2016-01-09 DIAGNOSIS — A419 Sepsis, unspecified organism: Secondary | ICD-10-CM | POA: Diagnosis not present

## 2016-01-09 DIAGNOSIS — Z79899 Other long term (current) drug therapy: Secondary | ICD-10-CM

## 2016-01-09 DIAGNOSIS — R0902 Hypoxemia: Secondary | ICD-10-CM | POA: Diagnosis present

## 2016-01-09 DIAGNOSIS — R000 Tachycardia, unspecified: Secondary | ICD-10-CM | POA: Diagnosis present

## 2016-01-09 DIAGNOSIS — R001 Bradycardia, unspecified: Secondary | ICD-10-CM | POA: Diagnosis present

## 2016-01-09 DIAGNOSIS — Z8249 Family history of ischemic heart disease and other diseases of the circulatory system: Secondary | ICD-10-CM

## 2016-01-09 DIAGNOSIS — Z8679 Personal history of other diseases of the circulatory system: Secondary | ICD-10-CM

## 2016-01-09 DIAGNOSIS — E785 Hyperlipidemia, unspecified: Secondary | ICD-10-CM | POA: Diagnosis present

## 2016-01-09 DIAGNOSIS — Z923 Personal history of irradiation: Secondary | ICD-10-CM

## 2016-01-09 DIAGNOSIS — Z7189 Other specified counseling: Secondary | ICD-10-CM

## 2016-01-09 DIAGNOSIS — R339 Retention of urine, unspecified: Secondary | ICD-10-CM

## 2016-01-09 DIAGNOSIS — I959 Hypotension, unspecified: Secondary | ICD-10-CM | POA: Diagnosis present

## 2016-01-09 DIAGNOSIS — C3431 Malignant neoplasm of lower lobe, right bronchus or lung: Secondary | ICD-10-CM | POA: Diagnosis not present

## 2016-01-09 DIAGNOSIS — I447 Left bundle-branch block, unspecified: Secondary | ICD-10-CM | POA: Diagnosis present

## 2016-01-09 DIAGNOSIS — N1330 Unspecified hydronephrosis: Secondary | ICD-10-CM | POA: Diagnosis present

## 2016-01-09 DIAGNOSIS — Z66 Do not resuscitate: Secondary | ICD-10-CM | POA: Diagnosis present

## 2016-01-09 DIAGNOSIS — Z9981 Dependence on supplemental oxygen: Secondary | ICD-10-CM

## 2016-01-09 DIAGNOSIS — R262 Difficulty in walking, not elsewhere classified: Secondary | ICD-10-CM

## 2016-01-09 DIAGNOSIS — N3289 Other specified disorders of bladder: Secondary | ICD-10-CM | POA: Diagnosis present

## 2016-01-09 DIAGNOSIS — R7881 Bacteremia: Secondary | ICD-10-CM

## 2016-01-09 DIAGNOSIS — R110 Nausea: Secondary | ICD-10-CM

## 2016-01-09 DIAGNOSIS — R1030 Lower abdominal pain, unspecified: Secondary | ICD-10-CM

## 2016-01-09 DIAGNOSIS — K219 Gastro-esophageal reflux disease without esophagitis: Secondary | ICD-10-CM | POA: Diagnosis present

## 2016-01-09 DIAGNOSIS — N359 Urethral stricture, unspecified: Secondary | ICD-10-CM | POA: Diagnosis present

## 2016-01-09 DIAGNOSIS — K449 Diaphragmatic hernia without obstruction or gangrene: Secondary | ICD-10-CM | POA: Diagnosis present

## 2016-01-09 DIAGNOSIS — I5032 Chronic diastolic (congestive) heart failure: Secondary | ICD-10-CM | POA: Diagnosis present

## 2016-01-09 DIAGNOSIS — N401 Enlarged prostate with lower urinary tract symptoms: Secondary | ICD-10-CM | POA: Diagnosis present

## 2016-01-09 DIAGNOSIS — Z85118 Personal history of other malignant neoplasm of bronchus and lung: Secondary | ICD-10-CM

## 2016-01-09 DIAGNOSIS — I4891 Unspecified atrial fibrillation: Secondary | ICD-10-CM | POA: Diagnosis present

## 2016-01-09 DIAGNOSIS — R059 Cough, unspecified: Secondary | ICD-10-CM

## 2016-01-09 DIAGNOSIS — Z7982 Long term (current) use of aspirin: Secondary | ICD-10-CM | POA: Diagnosis not present

## 2016-01-09 DIAGNOSIS — E860 Dehydration: Secondary | ICD-10-CM | POA: Diagnosis present

## 2016-01-09 DIAGNOSIS — Z95810 Presence of automatic (implantable) cardiac defibrillator: Secondary | ICD-10-CM

## 2016-01-09 DIAGNOSIS — Z87440 Personal history of urinary (tract) infections: Secondary | ICD-10-CM | POA: Diagnosis not present

## 2016-01-09 DIAGNOSIS — Z9221 Personal history of antineoplastic chemotherapy: Secondary | ICD-10-CM

## 2016-01-09 DIAGNOSIS — K5900 Constipation, unspecified: Secondary | ICD-10-CM

## 2016-01-09 LAB — URINALYSIS, COMPLETE (UACMP) WITH MICROSCOPIC
BACTERIA UA: NONE SEEN
BILIRUBIN URINE: NEGATIVE
GLUCOSE, UA: NEGATIVE mg/dL
HGB URINE DIPSTICK: NEGATIVE
Ketones, ur: 5 mg/dL — AB
NITRITE: NEGATIVE
PROTEIN: 30 mg/dL — AB
Specific Gravity, Urine: 1.015 (ref 1.005–1.030)
pH: 5 (ref 5.0–8.0)

## 2016-01-09 LAB — COMPREHENSIVE METABOLIC PANEL
ALBUMIN: 3.9 g/dL (ref 3.5–5.0)
ALT: 13 U/L — ABNORMAL LOW (ref 17–63)
ANION GAP: 12 (ref 5–15)
AST: 25 U/L (ref 15–41)
Alkaline Phosphatase: 79 U/L (ref 38–126)
BUN: 38 mg/dL — AB (ref 6–20)
CHLORIDE: 100 mmol/L — AB (ref 101–111)
CO2: 23 mmol/L (ref 22–32)
Calcium: 9.2 mg/dL (ref 8.9–10.3)
Creatinine, Ser: 1.01 mg/dL (ref 0.61–1.24)
GFR calc Af Amer: >60 mL/min (ref >60)
GFR calc non Af Amer: >60 mL/min (ref >60)
GLUCOSE: 166 mg/dL — AB (ref 65–99)
POTASSIUM: 4.1 mmol/L (ref 3.5–5.1)
SODIUM: 135 mmol/L (ref 135–145)
Total Bilirubin: 1.3 mg/dL — ABNORMAL HIGH (ref 0.3–1.2)
Total Protein: 8.3 g/dL — ABNORMAL HIGH (ref 6.5–8.1)

## 2016-01-09 LAB — LACTIC ACID, PLASMA
Lactic Acid, Venous: 1.3 mmol/L (ref 0.5–1.9)
Lactic Acid, Venous: 2.3 mmol/L (ref 0.5–1.9)
Lactic Acid, Venous: 1 mmol/L (ref 0.5–1.9)

## 2016-01-09 LAB — CBC
HEMATOCRIT: 42.4 % (ref 40.0–52.0)
Hemoglobin: 14 g/dL (ref 13.0–18.0)
MCH: 29.3 pg (ref 26.0–34.0)
MCHC: 33.1 g/dL (ref 32.0–36.0)
MCV: 88.4 fL (ref 80.0–100.0)
PLATELETS: 175 10*3/uL (ref 150–440)
RBC: 4.79 MIL/uL (ref 4.40–5.90)
RDW: 16.4 % — AB (ref 11.5–14.5)
WBC: 12.9 10*3/uL — AB (ref 3.8–10.6)

## 2016-01-09 LAB — TSH: TSH: 9.708 u[IU]/mL — ABNORMAL HIGH (ref 0.350–4.500)

## 2016-01-09 LAB — MRSA PCR SCREENING: MRSA by PCR: NEGATIVE

## 2016-01-09 LAB — TROPONIN I: Troponin I: <0.03 ng/mL (ref <0.03)

## 2016-01-09 MED ORDER — LEVOTHYROXINE SODIUM 50 MCG PO TABS
50.0000 ug | ORAL_TABLET | Freq: Every day | ORAL | Status: DC
  Administered 2016-01-11 – 2016-01-14 (×4): 50 ug via ORAL
  Filled 2016-01-09 (×4): qty 1

## 2016-01-09 MED ORDER — SIMVASTATIN 20 MG PO TABS: 20.0000 mg | ORAL_TABLET | Freq: Every day | ORAL | Status: DC

## 2016-01-09 MED ORDER — PYRIDOSTIGMINE BROMIDE 60 MG PO TABS
60.0000 mg | ORAL_TABLET | Freq: Two times a day (BID) | ORAL | Status: DC
  Administered 2016-01-10 – 2016-01-14 (×8): 60 mg via ORAL
  Filled 2016-01-09 (×10): qty 1

## 2016-01-09 MED ORDER — VITAMIN D 1000 UNITS PO TABS
5000.0000 [IU] | ORAL_TABLET | Freq: Every morning | ORAL | Status: DC
  Filled 2016-01-09: qty 5

## 2016-01-09 MED ORDER — LEVOFLOXACIN IN D5W 750 MG/150ML IV SOLN
750.0000 mg | Freq: Once | INTRAVENOUS | Status: AC
  Administered 2016-01-09: 750 mg via INTRAVENOUS
  Filled 2016-01-09: qty 150

## 2016-01-09 MED ORDER — FAMOTIDINE IN NACL 20-0.9 MG/50ML-% IV SOLN
20.0000 mg | INTRAVENOUS | Status: DC
  Administered 2016-01-09 – 2016-01-10 (×2): 20 mg via INTRAVENOUS
  Filled 2016-01-09 (×3): qty 50

## 2016-01-09 MED ORDER — GABAPENTIN 300 MG PO CAPS
300.0000 mg | ORAL_CAPSULE | Freq: Three times a day (TID) | ORAL | Status: DC
  Administered 2016-01-10 – 2016-01-14 (×11): 300 mg via ORAL
  Filled 2016-01-09 (×11): qty 1

## 2016-01-09 MED ORDER — CYANOCOBALAMIN 500 MCG PO TABS
500.0000 ug | ORAL_TABLET | Freq: Every day | ORAL | Status: DC
  Filled 2016-01-09: qty 1

## 2016-01-09 MED ORDER — PROMETHAZINE HCL 25 MG/ML IJ SOLN
12.5000 mg | Freq: Four times a day (QID) | INTRAMUSCULAR | Status: DC | PRN
  Administered 2016-01-09 (×2): 12.5 mg via INTRAVENOUS
  Filled 2016-01-09 (×2): qty 1

## 2016-01-09 MED ORDER — DOCUSATE SODIUM 100 MG PO CAPS
100.0000 mg | ORAL_CAPSULE | Freq: Two times a day (BID) | ORAL | Status: DC
  Filled 2016-01-09 (×2): qty 1

## 2016-01-09 MED ORDER — ASPIRIN EC 81 MG PO TBEC
81.0000 mg | DELAYED_RELEASE_TABLET | Freq: Every day | ORAL | Status: DC
  Administered 2016-01-11 – 2016-01-14 (×4): 81 mg via ORAL
  Filled 2016-01-09 (×4): qty 1

## 2016-01-09 MED ORDER — VANCOMYCIN HCL IN DEXTROSE 1-5 GM/200ML-% IV SOLN
1000.0000 mg | Freq: Once | INTRAVENOUS | Status: AC
  Administered 2016-01-09: 1000 mg via INTRAVENOUS
  Filled 2016-01-09: qty 200

## 2016-01-09 MED ORDER — MAGNESIUM OXIDE 400 (241.3 MG) MG PO TABS
400.0000 mg | ORAL_TABLET | Freq: Every day | ORAL | Status: DC
  Administered 2016-01-11: 09:00:00 400 mg via ORAL
  Filled 2016-01-09: qty 1

## 2016-01-09 MED ORDER — DIGOXIN 125 MCG PO TABS: 250.0000 ug | ORAL_TABLET | Freq: Every day | ORAL | Status: DC

## 2016-01-09 MED ORDER — METHENAMINE HIPPURATE 1 G PO TABS
1.0000 g | ORAL_TABLET | Freq: Every day | ORAL | Status: DC
  Administered 2016-01-11: 1 g via ORAL
  Filled 2016-01-09 (×3): qty 1

## 2016-01-09 MED ORDER — LEVOFLOXACIN IN D5W 750 MG/150ML IV SOLN
750.0000 mg | Freq: Every day | INTRAVENOUS | Status: DC
  Administered 2016-01-10 – 2016-01-12 (×4): 750 mg via INTRAVENOUS
  Filled 2016-01-09 (×4): qty 150

## 2016-01-09 MED ORDER — SODIUM CHLORIDE 0.9 % IV SOLN
INTRAVENOUS | Status: DC
  Administered 2016-01-09 – 2016-01-10 (×3): via INTRAVENOUS

## 2016-01-09 MED ORDER — POTASSIUM CHLORIDE CRYS ER 10 MEQ PO TBCR
10.0000 meq | EXTENDED_RELEASE_TABLET | Freq: Every day | ORAL | Status: DC
  Administered 2016-01-12 – 2016-01-13 (×2): 10 meq via ORAL
  Filled 2016-01-09 (×3): qty 1

## 2016-01-09 MED ORDER — VITAMIN C 500 MG PO TABS
500.0000 mg | ORAL_TABLET | Freq: Three times a day (TID) | ORAL | Status: DC
  Administered 2016-01-10 – 2016-01-11 (×2): 500 mg via ORAL
  Filled 2016-01-09 (×2): qty 1

## 2016-01-09 MED ORDER — MIDODRINE HCL 5 MG PO TABS
5.0000 mg | ORAL_TABLET | Freq: Three times a day (TID) | ORAL | Status: DC
  Administered 2016-01-11 – 2016-01-14 (×11): 5 mg via ORAL
  Filled 2016-01-09 (×11): qty 1

## 2016-01-09 MED ORDER — PANTOPRAZOLE SODIUM 40 MG PO TBEC
40.0000 mg | DELAYED_RELEASE_TABLET | Freq: Every day | ORAL | Status: DC
  Administered 2016-01-11 – 2016-01-14 (×4): 40 mg via ORAL
  Filled 2016-01-09 (×4): qty 1

## 2016-01-09 MED ORDER — ONDANSETRON HCL 4 MG PO TABS: 4.0000 mg | ORAL_TABLET | Freq: Four times a day (QID) | ORAL | Status: DC | PRN

## 2016-01-09 MED ORDER — COENZYME Q10 100 MG PO CAPS: 100.0000 mg | ORAL_CAPSULE | Freq: Every day | ORAL | Status: DC

## 2016-01-09 MED ORDER — ONDANSETRON HCL 4 MG/2ML IJ SOLN
4.0000 mg | Freq: Once | INTRAMUSCULAR | Status: AC
  Administered 2016-01-09: 4 mg via INTRAVENOUS
  Filled 2016-01-09: qty 2

## 2016-01-09 MED ORDER — VITAMIN E 180 MG (400 UNIT) PO CAPS
400.0000 [IU] | ORAL_CAPSULE | Freq: Every day | ORAL | Status: DC
  Filled 2016-01-09 (×2): qty 1

## 2016-01-09 MED ORDER — RISAQUAD PO CAPS
1.0000 | ORAL_CAPSULE | Freq: Every day | ORAL | Status: DC
  Administered 2016-01-11 – 2016-01-14 (×4): 1 via ORAL
  Filled 2016-01-09 (×4): qty 1

## 2016-01-09 MED ORDER — VANCOMYCIN HCL IN DEXTROSE 750-5 MG/150ML-% IV SOLN
750.0000 mg | Freq: Two times a day (BID) | INTRAVENOUS | Status: AC
  Administered 2016-01-10 – 2016-01-11 (×5): 750 mg via INTRAVENOUS
  Filled 2016-01-09 (×6): qty 150

## 2016-01-09 MED ORDER — ADULT MULTIVITAMIN W/MINERALS CH
1.0000 | ORAL_TABLET | Freq: Every day | ORAL | Status: DC
  Filled 2016-01-09: qty 1

## 2016-01-09 MED ORDER — SODIUM CHLORIDE 0.9 % IV BOLUS (SEPSIS)
500.0000 mL | Freq: Once | INTRAVENOUS | Status: AC
  Administered 2016-01-09: 500 mL via INTRAVENOUS

## 2016-01-09 MED ORDER — VANCOMYCIN HCL IN DEXTROSE 750-5 MG/150ML-% IV SOLN
750.0000 mg | Freq: Two times a day (BID) | INTRAVENOUS | Status: DC
  Filled 2016-01-09 (×2): qty 150

## 2016-01-09 MED ORDER — ACETAMINOPHEN 650 MG RE SUPP
650.0000 mg | Freq: Four times a day (QID) | RECTAL | Status: DC | PRN
  Administered 2016-01-09: 650 mg via RECTAL
  Filled 2016-01-09: qty 1

## 2016-01-09 MED ORDER — MONTELUKAST SODIUM 10 MG PO TABS
10.0000 mg | ORAL_TABLET | Freq: Every evening | ORAL | Status: DC
  Administered 2016-01-11 – 2016-01-13 (×3): 10 mg via ORAL
  Filled 2016-01-09 (×3): qty 1

## 2016-01-09 MED ORDER — APIXABAN 5 MG PO TABS
5.0000 mg | ORAL_TABLET | Freq: Two times a day (BID) | ORAL | Status: DC
  Administered 2016-01-10 – 2016-01-13 (×6): 5 mg via ORAL
  Filled 2016-01-09 (×6): qty 1

## 2016-01-09 MED ORDER — HYDROCORTISONE 10 MG PO TABS
10.0000 mg | ORAL_TABLET | Freq: Two times a day (BID) | ORAL | Status: DC
  Administered 2016-01-10 – 2016-01-14 (×8): 10 mg via ORAL
  Filled 2016-01-09 (×10): qty 1

## 2016-01-09 MED ORDER — FAMOTIDINE 20 MG PO TABS: 40.0000 mg | ORAL_TABLET | Freq: Every day | ORAL | Status: DC

## 2016-01-09 MED ORDER — FUROSEMIDE 20 MG PO TABS: 20.0000 mg | ORAL_TABLET | ORAL | Status: DC | PRN

## 2016-01-09 MED ORDER — ACETAMINOPHEN 325 MG PO TABS: 650.0000 mg | ORAL_TABLET | Freq: Four times a day (QID) | ORAL | Status: DC | PRN

## 2016-01-09 MED ORDER — ONDANSETRON HCL 4 MG/2ML IJ SOLN
4.0000 mg | Freq: Four times a day (QID) | INTRAMUSCULAR | Status: DC | PRN
  Administered 2016-01-09 (×2): 4 mg via INTRAVENOUS
  Filled 2016-01-09 (×2): qty 2

## 2016-01-09 NOTE — Progress Notes (Signed)
ID E note Russell Howard is a 81 y.o. male with lung cancer loculated R pleural effusions, prior admission in Oct for obstructive PNA, now with recurrent SOB, hypoxia, fevers and leukocytosis. Allergic to PCN and sulfa.  UA TNTC WBC.  Recommendations COntinue vanco and levo If decompensates add aztreonam

## 2016-01-09 NOTE — Care Management (Signed)
Admitted to this facility with the diagnosis of nausea/vomitting. Relative is listed as [name] [phone]). Last seen Dr. Doy Hutching 01/03/16.  Followed by [facility] when discharged from facility 06/18/15. Goes to Urology office for services. Shelbie Ammons RN MSN CCM Care Management

## 2016-01-09 NOTE — Progress Notes (Signed)
Pt refusing all PR meds.  Cannot tolerate due to vomiting, and nausea.

## 2016-01-09 NOTE — Progress Notes (Signed)
Advanced Care Plan.  Purpose of Encounter: Hospice care. Parties in Attendance: The patient, his daughter-in-law (POA), RN and me. Patient's Decisional Capacity: No. Subjective/Patient Story: The patient with past medical history of lung cancer as well as multiple thoracenteses for pleural effusion presents emergency department complaining of nausea and shortness of breath.  Objective/Medical Story: He is found to have sepsis with UTI, treated with antibiotics. He is hypotensive, given normal saline bolus. In addition he has nausea, vomiting and dehydration. His general condition is declining. I discussed with his daughter-in-law about his critical condition and poor prognosis. The daughter-in-law agreed to get palliative care consult to discuss hospice care.  Goals of Care Determinations: Hospice care or hospice home.  Plan:  Code Status: DO NOT RESUSCITATE.  Time spent discussing advance care planning: 20 minutes.

## 2016-01-09 NOTE — Progress Notes (Signed)
Pharmacy Antibiotic Note  Russell Howard is a 81 y.o. male admitted on 01/09/2016 with sepsis.  Pharmacy has been consulted for vancomycin dosing.  Plan: Vancomycin 1000 mg IV x 1 followed in 6 hours (stacked dosing) by vancomycin 750 mg IV Q12H, predicted trough 15 mcg/mL. Pharmacy will continue to follow and adjust as needed to maintain trough 15 to 20 mcg/mL.   Vd 53.3 L, Ke 0.058, T1/2 11.9 hr  Weight: 168 lb (76.2 kg)  Temp (24hrs), Avg:98.2 F (36.8 C), Min:98 F (36.7 C), Max:98.4 F (36.9 C)   Recent Labs Lab 01/09/16 0038  WBC 12.9*  CREATININE 1.01  LATICACIDVEN 1.3    Estimated Creatinine Clearance: 62.2 mL/min (by C-G formula based on SCr of 1.01 mg/dL).    Allergies  Allergen Reactions  . Penicillin G Hives    Has patient had a PCN reaction causing immediate rash, facial/tongue/throat swelling, SOB or lightheadedness with hypotension: Yes Has patient had a PCN reaction causing severe rash involving mucus membranes or skin necrosis: No Has patient had a PCN reaction that required hospitalization No Has patient had a PCN reaction occurring within the last 10 years: No If all of the above answers are "NO", then may proceed with Cephalosporin use.  . Sulfa Antibiotics Rash    Thank you for allowing pharmacy to be a part of this patient's care.  Laural Benes, Pharm.D., BCPS Clinical Pharmacist 01/09/2016 7:10 AM

## 2016-01-09 NOTE — ED Triage Notes (Signed)
Pt bib EMS from home w/ c/o n/v x 3 days. Denies diarrhea.  Pt alert and oriented to self, place and situation.  Pt denies blood in emesis.  Pt denies CP or incr SOB.  Pt sts that he has been able to keep down PO fluids.  Pt c/o incr weakness, denies pain.

## 2016-01-09 NOTE — Progress Notes (Signed)
Pharmacy Antibiotic Note  Russell Howard is a 81 y.o. male admitted on 01/09/2016 with sepsis.  Pharmacy has been consulted for levofloxacin/vancomycin dosing.  Plan: 1. Levofloxacin 750 mg IV Q24H 2. Vancomycin 750 mg IV Q12H, predicted trough 15 mcg/mL.  ** Levofloxacin was ordered as "once" in ED. Notes from hospitalist and ID were to continue but no additional order was placed. **  ** Vancomycin was discontinued by hospitalist for negative MRSA nasal on 01/09/16. ID then wrote note to continue vanc/levo - need gram positive coverage but multiple drug allergies.**  Height: '5\' 10"'$  (177.8 cm) Weight: 177 lb (80.3 kg) IBW/kg (Calculated) : 73  Temp (24hrs), Avg:99.3 F (37.4 C), Min:98 F (36.7 C), Max:100.7 F (38.2 C)   Recent Labs Lab 01/09/16 0038 01/09/16 1910  WBC 12.9*  --   CREATININE 1.01  --   LATICACIDVEN 1.3 2.3*    Estimated Creatinine Clearance: 62.2 mL/min (by C-G formula based on SCr of 1.01 mg/dL).    Allergies  Allergen Reactions  . Penicillin G Hives    Has patient had a PCN reaction causing immediate rash, facial/tongue/throat swelling, SOB or lightheadedness with hypotension: Yes Has patient had a PCN reaction causing severe rash involving mucus membranes or skin necrosis: No Has patient had a PCN reaction that required hospitalization No Has patient had a PCN reaction occurring within the last 10 years: No If all of the above answers are "NO", then may proceed with Cephalosporin use.  . Sulfa Antibiotics Rash   Thank you for allowing pharmacy to be a part of this patient's care.  Laural Benes, Pharm.D., BCPS Clinical Pharmacist 01/09/2016 11:02 PM

## 2016-01-09 NOTE — Consult Note (Signed)
[facility]     Reason for Consult: Sepsis   Referring Physician: Estanislado Spire Date of Admission:  01/09/2016   Active Problems:   Hypoxia   HPI: Basem Jeanty is a 81 y.o. male admitted with nausea and SOB.  He had elevated wbc and temp to 101, as well as hypoxia. He has known prior lung cancer as well as recurrent pleural effusions and recent admission in Oct for obstructive PNA.  CXR this admission shows continued loculated pleural collections in R hemithorax and consolidation but minimal change.   Treated with vanco and levo and improving with no fevers since 12.6.    Past Medical History:  Diagnosis Date  . Acquired hypothyroidism 11/02/2014  . BPH (benign prostatic hyperplasia)   . Cardiac defibrillator in place 11/02/2014  . Cardiomyopathy ([place])   . CHF (congestive heart failure) ([place])   . Chronic diastolic heart failure ([place]) 11/02/2014  . Gastro-esophageal reflux disease without esophagitis 11/02/2014  . H/O malignant neoplasm 11/09/2014  . Heart valve disease 11/02/2014  . History of atrial fibrillation 11/02/2014  . Lung cancer ([place])   . Neuropathy ([place]) 11/02/2014  . Orthostasis 11/02/2014  . Pure hypercholesterolemia 11/02/2014  . Valvular heart disease    Past Surgical History:  Procedure Laterality Date  . APPENDECTOMY    . DUAL ICD IMPLANT  2007/2009   implantable cardiac defibrillator  . PORT-A-CATH REMOVAL     Social History  Substance Use Topics  . Smoking status: Former Smoker    Types: Cigarettes    Quit date: 11/21/1981  . Smokeless tobacco: Never Used  . Alcohol use No   Family History  Problem Relation Age of Onset  . Hypertension    . Heart disease      Allergies:  Allergies  Allergen Reactions  . Penicillin G Hives    Has patient had a PCN reaction causing immediate rash, facial/tongue/throat swelling, SOB or lightheadedness with hypotension: Yes Has patient had a PCN reaction causing severe rash involving mucus membranes or skin  necrosis: No Has patient had a PCN reaction that required hospitalization No Has patient had a PCN reaction occurring within the last 10 years: No If all of the above answers are "NO", then may proceed with Cephalosporin use.  . Sulfa Antibiotics Rash    Current antibiotics: Antibiotics Given (last 72 hours)    Date/Time Action Medication Dose Rate   01/09/16 0650 Given   vancomycin (VANCOCIN) IVPB 1000 mg/200 mL premix 1,000 mg 200 mL/hr      MEDICATIONS: . acidophilus  1 capsule Oral Daily  . apixaban  5 mg Oral BID  . aspirin EC  81 mg Oral Daily  . cholecalciferol  5,000 Units Oral q morning - 10a  . cyanocobalamin  500 mcg Oral Daily  . docusate sodium  100 mg Oral BID  . famotidine (PEPCID) IV  20 mg Intravenous Q24H  . gabapentin  300 mg Oral TID  . hydrocortisone  10 mg Oral BID  . levothyroxine  50 mcg Oral QAC breakfast  . magnesium oxide  400 mg Oral Daily  . methenamine  1 g Oral Daily  . midodrine  5 mg Oral TID WC  . montelukast  10 mg Oral QPM  . multivitamin with minerals  1 tablet Oral Daily  . pantoprazole  40 mg Oral QAC breakfast  . potassium chloride  10 mEq Oral Daily  . pyridostigmine  60 mg Oral BID  . simvastatin  20 mg Oral q1800  .  sodium chloride  500 mL Intravenous Once  . vitamin C  500 mg Oral TID  . vitamin E  400 Units Oral Daily    Review of Systems - 11 systems reviewed and negative per HPI   OBJECTIVE: Temp:  [98 F (36.7 C)-100.7 F (38.2 C)] 100.7 F (38.2 C) (12/06 1547) Pulse Rate:  [43-118] 46 (12/06 1637) Resp:  [18-44] 18 (12/06 1547) BP: (87-151)/(37-111) 96/37 (12/06 1637) SpO2:  [85 %-100 %] 90 % (12/06 1637) Weight:  [76.2 kg (168 lb)-80.3 kg (177 lb)] 80.3 kg (177 lb) (12/06 8101) Physical Exam  Constitutional: frail, ill appearing, coughing  HENT: anicteric  Mouth/Throat: Oropharynx is clear and dry. No oropharyngeal exudate.  Cardiovascular: tachy Pulmonary/Chest: poor air movement,  Abdominal: Soft. Bowel  sounds are normal. He exhibits no distension. There is no tenderness.  Lymphadenopathy:  He has no cervical adenopathy.  Neurological: He is alert and oriented to person, place, and time.  Skin: Skin is warm and dry. No rash noted. No erythema.  Psychiatric: He has a normal mood and affect. His behavior is normal.     LABS: Results for orders placed or performed during the hospital encounter of 01/09/16 (from the past 48 hour(s))  CBC     Status: Abnormal   Collection Time: 01/09/16 12:38 AM  Result Value Ref Range   WBC 12.9 (H) 3.8 - 10.6 K/uL   RBC 4.79 4.40 - 5.90 MIL/uL   Hemoglobin 14.0 13.0 - 18.0 g/dL   HCT 42.4 40.0 - 52.0 %   MCV 88.4 80.0 - 100.0 fL   MCH 29.3 26.0 - 34.0 pg   MCHC 33.1 32.0 - 36.0 g/dL   RDW 16.4 (H) 11.5 - 14.5 %   Platelets 175 150 - 440 K/uL  Comprehensive metabolic panel     Status: Abnormal   Collection Time: 01/09/16 12:38 AM  Result Value Ref Range   Sodium 135 135 - 145 mmol/L   Potassium 4.1 3.5 - 5.1 mmol/L   Chloride 100 (L) 101 - 111 mmol/L   CO2 23 22 - 32 mmol/L   Glucose, Bld 166 (H) 65 - 99 mg/dL   BUN 38 (H) 6 - 20 mg/dL   Creatinine, Ser 1.01 0.61 - 1.24 mg/dL   Calcium 9.2 8.9 - 10.3 mg/dL   Total Protein 8.3 (H) 6.5 - 8.1 g/dL   Albumin 3.9 3.5 - 5.0 g/dL   AST 25 15 - 41 U/L   ALT 13 (L) 17 - 63 U/L   Alkaline Phosphatase 79 38 - 126 U/L   Total Bilirubin 1.3 (H) 0.3 - 1.2 mg/dL   GFR calc non Af Amer >60 >60 mL/min   GFR calc Af Amer >60 >60 mL/min    Comment: (NOTE) The eGFR has been calculated using the CKD EPI equation. This calculation has not been validated in all clinical situations. eGFR's persistently <60 mL/min signify possible Chronic Kidney Disease.    Anion gap 12 5 - 15  Troponin I     Status: None   Collection Time: 01/09/16 12:38 AM  Result Value Ref Range   Troponin I <0.03 <0.03 ng/mL  Lactic acid, plasma     Status: None   Collection Time: 01/09/16 12:38 AM  Result Value Ref Range   Lactic  Acid, Venous 1.3 0.5 - 1.9 mmol/L  TSH     Status: Abnormal   Collection Time: 01/09/16 12:38 AM  Result Value Ref Range   TSH 9.708 (H) 0.350 - 4.500  uIU/mL    Comment: Performed by a 3rd Generation assay with a functional sensitivity of <=0.01 uIU/mL.  Urinalysis, Complete w Microscopic     Status: Abnormal   Collection Time: 01/09/16  1:41 AM  Result Value Ref Range   Color, Urine YELLOW (A) YELLOW   APPearance HAZY (A) CLEAR   Specific Gravity, Urine 1.015 1.005 - 1.030   pH 5.0 5.0 - 8.0   Glucose, UA NEGATIVE NEGATIVE mg/dL   Hgb urine dipstick NEGATIVE NEGATIVE   Bilirubin Urine NEGATIVE NEGATIVE   Ketones, ur 5 (A) NEGATIVE mg/dL   Protein, ur 30 (A) NEGATIVE mg/dL   Nitrite NEGATIVE NEGATIVE   Leukocytes, UA MODERATE (A) NEGATIVE   RBC / HPF 6-30 0 - 5 RBC/hpf   WBC, UA TOO NUMEROUS TO COUNT 0 - 5 WBC/hpf   Bacteria, UA NONE SEEN NONE SEEN   Squamous Epithelial / LPF 0-5 (A) NONE SEEN   Mucous PRESENT   Blood culture (routine x 2)     Status: None (Preliminary result)   Collection Time: 01/09/16  4:32 AM  Result Value Ref Range   Specimen Description BLOOD  R AC    Special Requests      BOTTLES DRAWN AEROBIC AND ANAEROBIC  AER 14 ML ANA 12 ML    Culture NO GROWTH < 12 HOURS    Report Status PENDING   Blood culture (routine x 2)     Status: None (Preliminary result)   Collection Time: 01/09/16  4:32 AM  Result Value Ref Range   Specimen Description BLOOD  L WRIST    Special Requests      BOTTLES DRAWN AEROBIC AND ANAEROBIC  AER 11 ML ANA 10 ML   Culture NO GROWTH < 12 HOURS    Report Status PENDING   MRSA PCR Screening     Status: None   Collection Time: 01/09/16  8:57 AM  Result Value Ref Range   MRSA by PCR NEGATIVE NEGATIVE    Comment:        The GeneXpert MRSA Assay (FDA approved for NASAL specimens only), is one component of a comprehensive MRSA colonization surveillance program. It is not intended to diagnose MRSA infection nor to guide or monitor  treatment for MRSA infections.    No components found for: ESR, C REACTIVE PROTEIN MICRO: Recent Results (from the past 720 hour(s))  Blood culture (routine x 2)     Status: None (Preliminary result)   Collection Time: 01/09/16  4:32 AM  Result Value Ref Range Status   Specimen Description BLOOD  R AC  Final   Special Requests   Final    BOTTLES DRAWN AEROBIC AND ANAEROBIC  AER 14 ML ANA 12 ML    Culture NO GROWTH < 12 HOURS  Final   Report Status PENDING  Incomplete  Blood culture (routine x 2)     Status: None (Preliminary result)   Collection Time: 01/09/16  4:32 AM  Result Value Ref Range Status   Specimen Description BLOOD  L WRIST  Final   Special Requests   Final    BOTTLES DRAWN AEROBIC AND ANAEROBIC  AER 11 ML ANA 10 ML   Culture NO GROWTH < 12 HOURS  Final   Report Status PENDING  Incomplete  MRSA PCR Screening     Status: None   Collection Time: 01/09/16  8:57 AM  Result Value Ref Range Status   MRSA by PCR NEGATIVE NEGATIVE Final    Comment:  The GeneXpert MRSA Assay (FDA approved for NASAL specimens only), is one component of a comprehensive MRSA colonization surveillance program. It is not intended to diagnose MRSA infection nor to guide or monitor treatment for MRSA infections.     IMAGING: Dg Chest 2 View  Result Date: 01/09/2016 CLINICAL DATA:  Hypoxia and dyspnea. EXAM: CHEST  2 VIEW COMPARISON:  11/08/2015 FINDINGS: Intact appearances of the transvenous cardiac leads. Prosthetic aortic valve. Unchanged loculated right pleural collection and right lung consolidation/volume loss. Left lung is clear. Hiatal hernia. IMPRESSION: Loculated pleural collections in the right hemi thorax, as well as parenchymal lung consolidation and volume loss. This is unchanged. Hiatal hernia. Left lung is clear. Electronically Signed   By: Andreas Newport M.D.   On: 01/09/2016 01:34   Ct Head Wo Contrast  Result Date: 01/09/2016 CLINICAL DATA:  Weakness. Nausea and  vomiting. Hypoxia and shortness of breath. EXAM: CT HEAD WITHOUT CONTRAST TECHNIQUE: Contiguous axial images were obtained from the base of the skull through the vertex without intravenous contrast. COMPARISON:  None. FINDINGS: Brain: Moderate generalized cerebral and cerebellar atrophy. Moderate chronic small vessel ischemia. No CT findings of territorial or acute ischemia. No mass effect or midline shift. No hydrocephalus. Vascular: Atherosclerosis of skullbase vasculature. No hyperdense vessel or abnormal calcification. Skull: Normal. Negative for fracture or focal lesion. Sinuses/Orbits: Mucosal thickening of the maxillary sinuses with chronic cortical thickening on the right. Mucosal thickening of scattered ethmoid air cells. No sinus fluid levels. Probable bilateral cataract resection. Orbits are otherwise unremarkable. Other: None. IMPRESSION: Atrophy and chronic small vessel ischemia. No CT findings of acute intracranial abnormality. Electronically Signed   By: Jeb Levering M.D.   On: 01/09/2016 01:43    Assessment:   Santiel Arshad is a 81 y.o. male with lung cancer loculated R pleural effusions, prior admission in Oct for obstructive PNA, now with recurrent SOB, hypoxia, fevers and leukocytosis. Allergic to PCN and sulfa.  UA TNTC WBC. 1/2 BCX + Coag neg staph  Recommendations COntinue vanco and levo If decompensates add aztreonam  Can hopefully change to oral agent and dc tomorrow if improvign.   Thank you very much for allowing me to participate in the care of this patient. Please call with questions.   Cheral Marker. Ola Spurr, MD

## 2016-01-09 NOTE — H&P (Signed)
Russell Howard is an 81 y.o. male.   Chief Complaint: Nausea HPI: The patient with past medical history of lung cancer as well as multiple thoracenteses for pleural effusion presents emergency department complaining of nausea and shortness of breath. The patient states that he's been feeling unwell since yesterday but became acutely more fatigued and nauseous this afternoon. He admits to feeling short of breath after a few episodes of vomiting. Notably he is unable to bring up much except mucous. He is seen no blood in his emesis or stool. In the emergency department the patient was found to be hypoxic to 85% on room air. He was placed on nasal cannula which improved oxygen saturations but not his air hunger. Chest x-ray showed unchanged loculated pleural effusion on the right. The patient's vital signs were consistent with sepsis which prompted the emergency department staff to initiate fluid resuscitation as well as blood cultures prior to dosing Levaquin for possible pneumonia or UTI. Then hospitalist service was called for further evaluation.  Past Medical History:  Diagnosis Date  . Acquired hypothyroidism 11/02/2014  . BPH (benign prostatic hyperplasia)   . Cardiac defibrillator in place 11/02/2014  . Cardiomyopathy ([place])   . CHF (congestive heart failure) ([place])   . Chronic diastolic heart failure ([place]) 11/02/2014  . Gastro-esophageal reflux disease without esophagitis 11/02/2014  . H/O malignant neoplasm 11/09/2014  . Heart valve disease 11/02/2014  . History of atrial fibrillation 11/02/2014  . Lung cancer ([place])   . Neuropathy ([place]) 11/02/2014  . Orthostasis 11/02/2014  . Pure hypercholesterolemia 11/02/2014  . Valvular heart disease     Past Surgical History:  Procedure Laterality Date  . APPENDECTOMY    . DUAL ICD IMPLANT  2007/2009   implantable cardiac defibrillator  . PORT-A-CATH REMOVAL      Family History  Problem Relation Age of Onset  . Hypertension    . Heart disease      Social History:  reports that he quit smoking about 34 years ago. His smoking use included Cigarettes. He has never used smokeless tobacco. He reports that he does not drink alcohol or use drugs.  Allergies:  Allergies  Allergen Reactions  . Penicillin G Hives    Has patient had a PCN reaction causing immediate rash, facial/tongue/throat swelling, SOB or lightheadedness with hypotension: Yes Has patient had a PCN reaction causing severe rash involving mucus membranes or skin necrosis: No Has patient had a PCN reaction that required hospitalization No Has patient had a PCN reaction occurring within the last 10 years: No If all of the above answers are "NO", then may proceed with Cephalosporin use.  . Sulfa Antibiotics Rash    Medications Prior to Admission  Medication Sig Dispense Refill  . acidophilus (RISAQUAD) CAPS capsule Take 1 capsule by mouth daily.    Marland Kitchen apixaban (ELIQUIS) 5 MG TABS tablet Take 1 tablet (5 mg total) by mouth 2 (two) times daily. 60 tablet 0  . Ascorbic Acid (VITAMIN C) 1000 MG tablet Take 500 mg by mouth 3 (three) times daily. Reported on 06/14/2015    . aspirin EC 81 MG tablet Take 81 mg by mouth daily.     . Cholecalciferol 5000 units TABS Take 5,000 Units by mouth every morning.    . Coenzyme Q10 (TH CO Q-10) 100 MG capsule Take 100 mg by mouth daily.     Marland Kitchen DIGOX 250 MCG tablet Take 250 mcg by mouth daily.    . furosemide (LASIX) 20 MG tablet Take 20  mg by mouth every other day as needed (as needed for water retention).     . gabapentin (NEURONTIN) 300 MG capsule Take 300 mg by mouth 3 (three) times daily.     . hydrocortisone (CORTEF) 10 MG tablet Take 10 mg by mouth 2 (two) times daily.     Marland Kitchen levothyroxine (SYNTHROID, LEVOTHROID) 50 MCG tablet Take 50 mcg by mouth daily before breakfast.    . MAGNESIUM-OXIDE 400 (241.3 Mg) MG tablet Take 400 mg by mouth daily.    . methenamine (HIPREX) 1 g tablet TAKE 1 TABLET(1 GRAM) BY MOUTH TWICE DAILY WITH A MEAL 60  tablet 3  . midodrine (PROAMATINE) 5 MG tablet Take 5 mg by mouth 3 (three) times daily with meals.    . montelukast (SINGULAIR) 10 MG tablet Take 10 mg by mouth every evening.    . Multiple Vitamin (MULTIVITAMIN WITH MINERALS) TABS tablet Take 1 tablet by mouth daily.    . pantoprazole (PROTONIX) 40 MG tablet Take 40 mg by mouth daily before breakfast.     . potassium chloride (MICRO-K) 10 MEQ CR capsule Take 10 mEq by mouth daily.     Marland Kitchen pyridostigmine (MESTINON) 60 MG tablet Take 60 mg by mouth 2 (two) times daily.     . ranitidine (ZANTAC) 300 MG tablet Take 300 mg by mouth at bedtime.    . SAW PALMETTO-PUMPKIN SEED OIL PO Take 1 capsule by mouth daily.     . simvastatin (ZOCOR) 20 MG tablet Take 20 mg by mouth daily at 6 PM.     . vitamin B-12 (CYANOCOBALAMIN) 500 MCG tablet Take 500 mcg by mouth daily.     . vitamin E 400 UNIT capsule Take 400 Units by mouth daily.     Marland Kitchen levofloxacin (LEVAQUIN) 750 MG tablet Take 1 tablet (750 mg total) by mouth daily. (Patient not taking: Reported on 01/09/2016) 7 tablet 0  . metoprolol succinate (TOPROL XL) 25 MG 24 hr tablet Take 1 tablet (25 mg total) by mouth daily. (Patient not taking: Reported on 01/09/2016) 30 tablet 1    Results for orders placed or performed during the hospital encounter of 01/09/16 (from the past 48 hour(s))  CBC     Status: Abnormal   Collection Time: 01/09/16 12:38 AM  Result Value Ref Range   WBC 12.9 (H) 3.8 - 10.6 K/uL   RBC 4.79 4.40 - 5.90 MIL/uL   Hemoglobin 14.0 13.0 - 18.0 g/dL   HCT 42.4 40.0 - 52.0 %   MCV 88.4 80.0 - 100.0 fL   MCH 29.3 26.0 - 34.0 pg   MCHC 33.1 32.0 - 36.0 g/dL   RDW 16.4 (H) 11.5 - 14.5 %   Platelets 175 150 - 440 K/uL  Comprehensive metabolic panel     Status: Abnormal   Collection Time: 01/09/16 12:38 AM  Result Value Ref Range   Sodium 135 135 - 145 mmol/L   Potassium 4.1 3.5 - 5.1 mmol/L   Chloride 100 (L) 101 - 111 mmol/L   CO2 23 22 - 32 mmol/L   Glucose, Bld 166 (H) 65 - 99  mg/dL   BUN 38 (H) 6 - 20 mg/dL   Creatinine, Ser 1.01 0.61 - 1.24 mg/dL   Calcium 9.2 8.9 - 10.3 mg/dL   Total Protein 8.3 (H) 6.5 - 8.1 g/dL   Albumin 3.9 3.5 - 5.0 g/dL   AST 25 15 - 41 U/L   ALT 13 (L) 17 - 63 U/L   Alkaline  Phosphatase 79 38 - 126 U/L   Total Bilirubin 1.3 (H) 0.3 - 1.2 mg/dL   GFR calc non Af Amer >60 >60 mL/min   GFR calc Af Amer >60 >60 mL/min    Comment: (NOTE) The eGFR has been calculated using the CKD EPI equation. This calculation has not been validated in all clinical situations. eGFR's persistently <60 mL/min signify possible Chronic Kidney Disease.    Anion gap 12 5 - 15  Troponin I     Status: None   Collection Time: 01/09/16 12:38 AM  Result Value Ref Range   Troponin I <0.03 <0.03 ng/mL  Lactic acid, plasma     Status: None   Collection Time: 01/09/16 12:38 AM  Result Value Ref Range   Lactic Acid, Venous 1.3 0.5 - 1.9 mmol/L  Urinalysis, Complete w Microscopic     Status: Abnormal   Collection Time: 01/09/16  1:41 AM  Result Value Ref Range   Color, Urine YELLOW (A) YELLOW   APPearance HAZY (A) CLEAR   Specific Gravity, Urine 1.015 1.005 - 1.030   pH 5.0 5.0 - 8.0   Glucose, UA NEGATIVE NEGATIVE mg/dL   Hgb urine dipstick NEGATIVE NEGATIVE   Bilirubin Urine NEGATIVE NEGATIVE   Ketones, ur 5 (A) NEGATIVE mg/dL   Protein, ur 30 (A) NEGATIVE mg/dL   Nitrite NEGATIVE NEGATIVE   Leukocytes, UA MODERATE (A) NEGATIVE   RBC / HPF 6-30 0 - 5 RBC/hpf   WBC, UA TOO NUMEROUS TO COUNT 0 - 5 WBC/hpf   Bacteria, UA NONE SEEN NONE SEEN   Squamous Epithelial / LPF 0-5 (A) NONE SEEN   Mucous PRESENT    Dg Chest 2 View  Result Date: 01/09/2016 CLINICAL DATA:  Hypoxia and dyspnea. EXAM: CHEST  2 VIEW COMPARISON:  11/08/2015 FINDINGS: Intact appearances of the transvenous cardiac leads. Prosthetic aortic valve. Unchanged loculated right pleural collection and right lung consolidation/volume loss. Left lung is clear. Hiatal hernia. IMPRESSION: Loculated  pleural collections in the right hemi thorax, as well as parenchymal lung consolidation and volume loss. This is unchanged. Hiatal hernia. Left lung is clear. Electronically Signed   By: Andreas Newport M.D.   On: 01/09/2016 01:34   Ct Head Wo Contrast  Result Date: 01/09/2016 CLINICAL DATA:  Weakness. Nausea and vomiting. Hypoxia and shortness of breath. EXAM: CT HEAD WITHOUT CONTRAST TECHNIQUE: Contiguous axial images were obtained from the base of the skull through the vertex without intravenous contrast. COMPARISON:  None. FINDINGS: Brain: Moderate generalized cerebral and cerebellar atrophy. Moderate chronic small vessel ischemia. No CT findings of territorial or acute ischemia. No mass effect or midline shift. No hydrocephalus. Vascular: Atherosclerosis of skullbase vasculature. No hyperdense vessel or abnormal calcification. Skull: Normal. Negative for fracture or focal lesion. Sinuses/Orbits: Mucosal thickening of the maxillary sinuses with chronic cortical thickening on the right. Mucosal thickening of scattered ethmoid air cells. No sinus fluid levels. Probable bilateral cataract resection. Orbits are otherwise unremarkable. Other: None. IMPRESSION: Atrophy and chronic small vessel ischemia. No CT findings of acute intracranial abnormality. Electronically Signed   By: Jeb Levering M.D.   On: 01/09/2016 01:43    Review of Systems  Constitutional: Positive for fever (subjective). Negative for chills.  HENT: Negative for sore throat and tinnitus.   Eyes: Negative for blurred vision and redness.  Respiratory: Positive for shortness of breath. Negative for cough.   Cardiovascular: Negative for chest pain, palpitations, orthopnea and PND.  Gastrointestinal: Positive for nausea and vomiting. Negative for abdominal pain  and diarrhea.  Genitourinary: Negative for dysuria, frequency and urgency.  Musculoskeletal: Negative for joint pain and myalgias.  Skin: Negative for rash.       No lesions   Neurological: Positive for weakness. Negative for speech change and focal weakness.  Endo/Heme/Allergies: Does not bruise/bleed easily.       No temperature intolerance  Psychiatric/Behavioral: Negative for depression and suicidal ideas.    Blood pressure (!) 151/111, pulse (!) 113, temperature 98 F (36.7 C), temperature source Oral, resp. rate 18, weight 76.2 kg (168 lb), SpO2 96 %. Physical Exam  Constitutional: He is oriented to person, place, and time. He appears well-developed and well-nourished. No distress.  HENT:  Head: Normocephalic and atraumatic.  Mouth/Throat: Oropharynx is clear and moist.  Eyes: Conjunctivae and EOM are normal. Pupils are equal, round, and reactive to light. No scleral icterus.  Neck: Normal range of motion. Neck supple. No JVD present. No tracheal deviation present. No thyromegaly present.  Cardiovascular: Regular rhythm.  Tachycardia present.  Exam reveals no gallop and no friction rub.   Murmur heard. Respiratory: Breath sounds normal. Tachypnea noted.  GI: Soft. Bowel sounds are normal. He exhibits no distension. There is no tenderness.  Genitourinary:  Genitourinary Comments: Deferred  Musculoskeletal: Normal range of motion. He exhibits no edema.  Lymphadenopathy:    He has no cervical adenopathy.  Neurological: He is alert and oriented to person, place, and time. No cranial nerve deficit.  Skin: Skin is warm and dry. No rash noted. No erythema.  Psychiatric: He has a normal mood and affect. His behavior is normal. Judgment and thought content normal.     Assessment/Plan This is a 81 year old male admitted for sepsis. 1. Sepsis: The patient's criteria via tachycardia, leukocytosis and tachypnea. He is hemodynamically stable at this time although he has episodes of hypotension even when he is relatively well (note Midodrine is part of home regimen). Follow blood cultures for growth and sensitivities. The patient has been given Levaquin in the  emergency department. I have added vancomycin as well as a MRSA screen. If negative may discontinue Vanc. The patient consistently has pyuria. Urinalysis does not show nitrites or bacteria. 2. Hypoxia: Likely secondary to loculated effusions. Unchanged in appearance but the patient's clinical condition has worsened. Supply supplemental oxygen as needed. Consult CT service for possible drainage and/or decortication if necessary. 3. Congestive heart failure: Diastolic; ventricular paced with AICD. Continue Lasix and digoxin per home regimen. 4. Hypertension: Controlled. Continue metoprolol. (Also continue hydrocortisone as the patient has episodes of hypotension). 5. Hypothyroidism: Continue Synthroid 6. Hyperlipidemia: Continue statin therapy 7. Recurrent urinary tract infection: Continue methenamine. 8. 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 BPH: Continue Flomax 10. DVT prophylaxis: Eliquis 11. GI prophylaxis: H2 blocker and PPI per home regimen The patient is a DO NOT RESUSCITATE. Time spent on admission orders and patient care approximately 45 minutes  Harrie Foreman, MD 01/09/2016, 7:24 AM

## 2016-01-09 NOTE — ED Provider Notes (Signed)
[facility] Emergency Department Provider Note   ____________________________________________   First MD Initiated Contact with Patient 01/09/16 1230     (approximate)  I have reviewed the triage vital signs and the nursing notes.   HISTORY  Chief Complaint Nausea    HPI Russell Howard is a 81 y.o. male who comes into the hospital today feeling unwell. The patient states that he feels weak and he cannot do anything. He reports it is been slowly creeping up on him for the last couple of days. He reports that his daughter-in-law knows better and keep track of what going on. The patient denies any chest pain but has had some shortness of breath headache dizziness and nausea and vomiting. He reports that he only vomited once but he is unsure what look like. His abdomen was uncomfortable earlier but he states it is currently fine. He's had no diarrhea and constipation. He denies unilateral weakness and states that he feels weak all over. He's had a cough but denies runny nose and fever. The patient was unsure what to do so he decided to come into the hospital for evaluation today.   Past Medical History:  Diagnosis Date  . Acquired hypothyroidism 11/02/2014  . BPH (benign prostatic hyperplasia)   . Cardiac defibrillator in place 11/02/2014  . Cardiomyopathy ([place])   . CHF (congestive heart failure) ([place])   . Chronic diastolic heart failure ([place]) 11/02/2014  . Gastro-esophageal reflux disease without esophagitis 11/02/2014  . H/O malignant neoplasm 11/09/2014  . Heart valve disease 11/02/2014  . History of atrial fibrillation 11/02/2014  . Lung cancer ([place])   . Neuropathy ([place]) 11/02/2014  . Orthostasis 11/02/2014  . Pure hypercholesterolemia 11/02/2014  . Valvular heart disease     Patient Active Problem List   Diagnosis Date Noted  . Hypoxia 01/09/2016  . Primary cancer of bronchus of right lower lobe ([place]) 11/29/2015  . Sepsis ([place]) 06/14/2015  . UTI (lower  urinary tract infection) 06/14/2015    Class: Acute  . H/O malignant neoplasm 11/09/2014  . Acquired hypothyroidism 11/02/2014  . Cardiomyopathy ([place]) 11/02/2014  . Chronic diastolic heart failure ([place]) 11/02/2014  . Diabetes mellitus without complication ([place]) 99/83/3825  . Cardiac defibrillator in place 11/02/2014  . Gastro-esophageal reflux disease without esophagitis 11/02/2014  . History of atrial fibrillation 11/02/2014  . BP (high blood pressure) 11/02/2014  . Neuropathy ([place]) 11/02/2014  . Orthostasis 11/02/2014  . Pure hypercholesterolemia 11/02/2014  . Heart valve disease 11/02/2014    Past Surgical History:  Procedure Laterality Date  . APPENDECTOMY    . DUAL ICD IMPLANT  2007/2009   implantable cardiac defibrillator  . PORT-A-CATH REMOVAL      Prior to Admission medications   Medication Sig Start Date End Date Taking? Authorizing Provider  acidophilus (RISAQUAD) CAPS capsule Take 1 capsule by mouth daily.   Yes Historical Provider, MD  apixaban (ELIQUIS) 5 MG TABS tablet Take 1 tablet (5 mg total) by mouth 2 (two) times daily. 06/24/15  Yes Epifanio Lesches, MD  Ascorbic Acid (VITAMIN C) 1000 MG tablet Take 500 mg by mouth 3 (three) times daily. Reported on 06/14/2015   Yes Historical Provider, MD  aspirin EC 81 MG tablet Take 81 mg by mouth daily.    Yes Historical Provider, MD  Cholecalciferol 5000 units TABS Take 5,000 Units by mouth every morning.   Yes Historical Provider, MD  Coenzyme Q10 (TH CO Q-10) 100 MG capsule Take 100 mg by mouth daily.  Yes Historical Provider, MD  [place] 250 MCG tablet Take 250 mcg by mouth daily.   Yes Historical Provider, MD  furosemide (LASIX) 20 MG tablet Take 20 mg by mouth every other day as needed (as needed for water retention).  05/10/15  Yes Historical Provider, MD  gabapentin (NEURONTIN) 300 MG capsule Take 300 mg by mouth 3 (three) times daily.  12/20/14  Yes Historical Provider, MD  hydrocortisone (CORTEF) 10 MG tablet Take  10 mg by mouth 2 (two) times daily.    Yes Historical Provider, MD  levothyroxine (SYNTHROID, LEVOTHROID) 50 MCG tablet Take 50 mcg by mouth daily before breakfast.   Yes Historical Provider, MD  MAGNESIUM-OXIDE 400 (241.3 Mg) MG tablet Take 400 mg by mouth daily.   Yes Historical Provider, MD  methenamine (HIPREX) 1 g tablet TAKE 1 TABLET(1 GRAM) BY MOUTH TWICE DAILY WITH A MEAL 12/11/15  Yes Hollice Espy, MD  midodrine (PROAMATINE) 5 MG tablet Take 5 mg by mouth 3 (three) times daily with meals.   Yes Historical Provider, MD  montelukast (SINGULAIR) 10 MG tablet Take 10 mg by mouth every evening.   Yes Historical Provider, MD  Multiple Vitamin (MULTIVITAMIN WITH MINERALS) TABS tablet Take 1 tablet by mouth daily.   Yes Historical Provider, MD  pantoprazole (PROTONIX) 40 MG tablet Take 40 mg by mouth daily before breakfast.    Yes Historical Provider, MD  potassium chloride (MICRO-K) 10 MEQ CR capsule Take 10 mEq by mouth daily.    Yes Historical Provider, MD  pyridostigmine (MESTINON) 60 MG tablet Take 60 mg by mouth 2 (two) times daily.  05/07/15  Yes Historical Provider, MD  ranitidine (ZANTAC) 300 MG tablet Take 300 mg by mouth at bedtime.   Yes Historical Provider, MD  SAW PALMETTO-PUMPKIN SEED OIL PO Take 1 capsule by mouth daily.    Yes Historical Provider, MD  simvastatin (ZOCOR) 20 MG tablet Take 20 mg by mouth daily at 6 PM.    Yes Historical Provider, MD  vitamin B-12 (CYANOCOBALAMIN) 500 MCG tablet Take 500 mcg by mouth daily.    Yes Historical Provider, MD  vitamin E 400 UNIT capsule Take 400 Units by mouth daily.    Yes Historical Provider, MD  levofloxacin (LEVAQUIN) 750 MG tablet Take 1 tablet (750 mg total) by mouth daily. Patient not taking: Reported on 01/09/2016 11/12/15   Hillary Bow, MD  metoprolol succinate (TOPROL XL) 25 MG 24 hr tablet Take 1 tablet (25 mg total) by mouth daily. Patient not taking: Reported on 01/09/2016 09/07/15 09/06/16  Rudene Re, MD     Allergies Penicillin g and Sulfa antibiotics  Family History  Problem Relation Age of Onset  . Hypertension    . Heart disease      Social History Social History  Substance Use Topics  . Smoking status: Former Smoker    Types: Cigarettes    Quit date: 11/21/1981  . Smokeless tobacco: Never Used  . Alcohol use No    Review of Systems Constitutional: No fever/chills Eyes: No visual changes. ENT: No sore throat. Cardiovascular: Denies chest pain. Respiratory: Denies shortness of breath. Gastrointestinal: Nausea and vomiting No abdominal pain. No diarrhea.  No constipation. Genitourinary: Negative for dysuria. Musculoskeletal: Negative for back pain. Skin: Negative for rash. Neurological: Weakness, headache and dizziness  10-point ROS otherwise negative.  ____________________________________________   PHYSICAL EXAM:  VITAL SIGNS: ED Triage Vitals  Enc Vitals Group     BP 01/09/16 0031 109/69     Pulse Rate  01/09/16 0031 (!) 118     Resp 01/09/16 0031 (!) 44     Temp 01/09/16 0031 98.4 F (36.9 C)     Temp Source 01/09/16 0031 Oral     SpO2 01/09/16 0031 (!) 85 %     Weight 01/09/16 0035 168 lb (76.2 kg)     Height --      Head Circumference --      Peak Flow --      Pain Score 01/09/16 0035 0     Pain Loc --      Pain Edu? --      Excl. in [place]? --     Constitutional: Alert and oriented. Well appearing and in moderate distress. Eyes: Conjunctivae are normal. PERRL. EOMI. Head: Atraumatic. Nose: No congestion/rhinnorhea. Mouth/Throat: Mucous membranes are moist.  Oropharynx non-erythematous. Cardiovascular: Tachycardia, regular rhythm. Grossly normal heart sounds.  Good peripheral circulation. Respiratory: Increased respiratory effort.  No retractions. Diminished breath sounds on the right  Gastrointestinal: Soft and nontender. No distention. Positive bowel sounds Musculoskeletal: No lower extremity tenderness nor edema.   Neurologic:  Normal speech  and language.  Skin:  Skin is warm, dry and intact.  Psychiatric: Mood and affect are normal.   ____________________________________________   LABS (all labs ordered are listed, but only abnormal results are displayed)  Labs Reviewed  CBC - Abnormal; Notable for the following:       Result Value   WBC 12.9 (*)    RDW 16.4 (*)    All other components within normal limits  COMPREHENSIVE METABOLIC PANEL - Abnormal; Notable for the following:    Chloride 100 (*)    Glucose, Bld 166 (*)    BUN 38 (*)    Total Protein 8.3 (*)    ALT 13 (*)    Total Bilirubin 1.3 (*)    All other components within normal limits  URINALYSIS, COMPLETE (UACMP) WITH MICROSCOPIC - Abnormal; Notable for the following:    Color, Urine YELLOW (*)    APPearance HAZY (*)    Ketones, ur 5 (*)    Protein, ur 30 (*)    Leukocytes, UA MODERATE (*)    Squamous Epithelial / LPF 0-5 (*)    All other components within normal limits  URINE CULTURE  CULTURE, BLOOD (ROUTINE X 2)  CULTURE, BLOOD (ROUTINE X 2)  TROPONIN I  LACTIC ACID, PLASMA   ____________________________________________  EKG  ED ECG REPORT I, Loney Hering, the attending physician, personally viewed and interpreted this ECG.   Date: 01/09/2016  EKG Time: 0029  Rate: 117  Rhythm: normal sinus rhythm, paced rhythm  Axis: right axis deviation  Intervals:left bundle branch block  ST&T Change: LBBB  ____________________________________________  RADIOLOGY  CT head CXR ____________________________________________   PROCEDURES  Procedure(s) performed: None  Procedures  Critical Care performed: No  ____________________________________________   INITIAL IMPRESSION / ASSESSMENT AND PLAN / ED COURSE  Pertinent labs & imaging results that were available during my care of the patient were reviewed by me and considered in my medical decision making (see chart for details).  This is a 81 year old male who comes into the  hospital today with some weakness. When the patient initially arrived his oxygen saturations were low in the mid 80s. He was placed on some oxygen with some improvement in his oxygenation. The patient does not wear O2 at home. I did a chest x-ray and appears as though he has a loculated effusion. The patient has had an  effusion before and while they state it hasn't changed his previous reads do not describe it as loculated. The patient also has some white blood cells in his urine but his daughter reports that he self catheters and that is not uncommon. Given the patient's weakness, hypoxia and this chest x-ray read. The patient's daughter reports that he has also had some problems with tachycardia and hypotension which his doctors been treated in the past 2 weeks. She reports that he seemed to take the worst decline in the past week and worse today. I will admit the patient to the hospitalist given the symptoms. The patient does have a white blood cell count of 12.9 so in the event that he does have them infection I will treat the patient with levofloxacin. I discussed this with the patient and his daughter and they understand and agree with the plan as stated. He will be admitted to the hospitalist service for hypoxia, loculated pleural effusion and weakness.  Clinical Course as of Jan 08 506  Wed Jan 09, 2016  0144 Loculated pleural collections in the right hemi thorax, as well as parenchymal lung consolidation and volume loss. This is unchanged. Hiatal hernia. Left lung is clear.   DG Chest 2 View [AW]  0253 Atrophy and chronic small vessel ischemia. No CT findings of acute intracranial abnormality.   CT Head Wo Contrast [AW]    Clinical Course User Index [AW] Loney Hering, MD     ____________________________________________   FINAL CLINICAL IMPRESSION(S) / ED DIAGNOSES  Final diagnoses:  Hypoxia  Loculated pleural effusion  Weakness  Nausea      NEW MEDICATIONS STARTED  DURING THIS VISIT:  New Prescriptions   No medications on file     Note:  This document was prepared using Dragon voice recognition software and may include unintentional dictation errors.    Loney Hering, MD 01/09/16 [phone]

## 2016-01-09 NOTE — Progress Notes (Signed)
Pts lactic acid is 2.3.  Manuella Ghazi wants to keep pt on this floor at present time.  BP 102/36, HR 42, T 99.3.  Fluids increased to 189m/hr

## 2016-01-09 NOTE — Care Management Important Message (Signed)
Important Message  Patient Details  Name: Russell Howard MRN: [id number] Date of Birth: 1934/11/30   Medicare Important Message Given:  Yes    Shelbie Ammons, RN 01/09/2016, 9:14 AM

## 2016-01-09 NOTE — Progress Notes (Signed)
[place] [facility] [place] NAME: Damion Kant    MR#:  [id number]  DATE OF BIRTH:  12/13/1934  SUBJECTIVE:  CHIEF COMPLAINT:   Chief Complaint  Patient presents with  . Nausea   The patient complained of nausea and vomiting but no diarrhea. REVIEW OF SYSTEMS:  Review of Systems  Constitutional: Positive for malaise/fatigue. Negative for chills and fever.  Respiratory: Negative for cough and shortness of breath.   Cardiovascular: Negative for chest pain and leg swelling.  Gastrointestinal: Positive for nausea and vomiting. Negative for abdominal pain, blood in stool, diarrhea and melena.  Genitourinary: Negative for dysuria, frequency and hematuria.  Musculoskeletal:       Body pain, but the patient could not say where.  Neurological: Positive for weakness.   limited review of system due to patient's medical condition.  DRUG ALLERGIES:   Allergies  Allergen Reactions  . Penicillin G Hives    Has patient had a PCN reaction causing immediate rash, facial/tongue/throat swelling, SOB or lightheadedness with hypotension: Yes Has patient had a PCN reaction causing severe rash involving mucus membranes or skin necrosis: No Has patient had a PCN reaction that required hospitalization No Has patient had a PCN reaction occurring within the last 10 years: No If all of the above answers are "NO", then may proceed with Cephalosporin use.  . Sulfa Antibiotics Rash   VITALS:  Blood pressure (!) 96/37, pulse (!) 46, temperature (!) 100.7 F (38.2 C), temperature source Oral, resp. rate 18, height '5\' 10"'$  (1.778 m), weight 177 lb (80.3 kg), SpO2 90 %. PHYSICAL EXAMINATION:  Physical Exam  Constitutional: He is oriented to person, place, and time.  Lethargy and drowsy.  HENT:  Dry oral mucosa  Eyes: Conjunctivae and EOM are normal.  Neck: Neck supple. No JVD present. No tracheal deviation present.  Cardiovascular: Normal rate, regular rhythm and  normal heart sounds.  Exam reveals no gallop.   No murmur heard. Pulmonary/Chest: Effort normal. No respiratory distress. He has no wheezes. He has no rales.  Almost no lung sounds and crackles on the right side  Abdominal: Soft. Bowel sounds are normal. He exhibits no distension. There is no tenderness.  Musculoskeletal: Normal range of motion. He exhibits no edema or tenderness.  Neurological: He is oriented to person, place, and time. No cranial nerve deficit.  Skin: No rash noted. No erythema.   LABORATORY PANEL:   CBC  Recent Labs Lab 01/09/16 0038  WBC 12.9*  HGB 14.0  HCT 42.4  PLT 175   ------------------------------------------------------------------------------------------------------------------ Chemistries   Recent Labs Lab 01/09/16 0038  NA 135  K 4.1  CL 100*  CO2 23  GLUCOSE 166*  BUN 38*  CREATININE 1.01  CALCIUM 9.2  AST 25  ALT 13*  ALKPHOS 79  BILITOT 1.3*   RADIOLOGY:  Dg Chest 2 View  Result Date: 01/09/2016 CLINICAL DATA:  Hypoxia and dyspnea. EXAM: CHEST  2 VIEW COMPARISON:  11/08/2015 FINDINGS: Intact appearances of the transvenous cardiac leads. Prosthetic aortic valve. Unchanged loculated right pleural collection and right lung consolidation/volume loss. Left lung is clear. Hiatal hernia. IMPRESSION: Loculated pleural collections in the right hemi thorax, as well as parenchymal lung consolidation and volume loss. This is unchanged. Hiatal hernia. Left lung is clear. Electronically Signed   By: Andreas Newport M.D.   On: 01/09/2016 01:34   Ct Head Wo Contrast  Result Date: 01/09/2016 CLINICAL DATA:  Weakness. Nausea and vomiting. Hypoxia and shortness  of breath. EXAM: CT HEAD WITHOUT CONTRAST TECHNIQUE: Contiguous axial images were obtained from the base of the skull through the vertex without intravenous contrast. COMPARISON:  None. FINDINGS: Brain: Moderate generalized cerebral and cerebellar atrophy. Moderate chronic small vessel ischemia.  No CT findings of territorial or acute ischemia. No mass effect or midline shift. No hydrocephalus. Vascular: Atherosclerosis of skullbase vasculature. No hyperdense vessel or abnormal calcification. Skull: Normal. Negative for fracture or focal lesion. Sinuses/Orbits: Mucosal thickening of the maxillary sinuses with chronic cortical thickening on the right. Mucosal thickening of scattered ethmoid air cells. No sinus fluid levels. Probable bilateral cataract resection. Orbits are otherwise unremarkable. Other: None. IMPRESSION: Atrophy and chronic small vessel ischemia. No CT findings of acute intracranial abnormality. Electronically Signed   By: Jeb Levering M.D.   On: 01/09/2016 01:43   ASSESSMENT AND PLAN:   This is a 81 year old male admitted for sepsis. 1. Sepsis with UTI. I will get ID consult for antibiotics due to limited option since the patient has allergy to many antibiotics. Follow blood cultures for growth and sensitivities. The patient was given Levaquin and vancomycin in the emergency department. Negative MRSA screen. discontinue Vanc.  Urinalysis does not show nitrites or bacteria but too numerous WBC.  Hypotension. BP 96/37. Give normal saline bolus and monitor vital signs closely. Dehydration. Continue IV fluid support and follow-up BMP.  2. Hypoxia: Likely secondary to loculated effusions. Unchanged in appearance but the patient's clinical condition has worsened. Supply supplemental oxygen as needed. Follow up CT service for possible drainage and/or decortication if necessary.  3. Congestive heart failure: Diastolic; ventricular paced with AICD. discontinue Lasix and digoxin due to hypotension and bradycardia.  4. Hypertension: disontinue metoprolol. Continue hydrocortisone.  5. Hypothyroidism: Continue Synthroid 6. Hyperlipidemia: Continue statin therapy 7. Recurrent urinary tract infection: Continue methenamine. 8. 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 BPH: Continue Flomax  The patient has critical condition and the prognosis. Palliative care consult All the records are reviewed and case discussed with RN, Care Management/Social Worker. Management plans discussed with the patient, his daughter-in-law (POA) and they are in agreement.  CODE STATUS: DO NOT RESUSCITATE  TOTAL CRITICAL TIME TAKING CARE OF THIS PATIENT: 38 minutes.   More than 50% of the time was spent in counseling/coordination of care: YES  POSSIBLE D/C IN 3 DAYS, DEPENDING ON CLINICAL CONDITION.   Demetrios Loll M.D on 01/09/2016 at 5:18 PM  Between 7am to 6pm - Pager - [phone]  After 6pm go to [email or web address] - Proofreader  [facility]  Office  [phone]  CC: Primary care physician; [name], MD  Note: This dictation was prepared with Dragon dictation along with smaller phrase technology. Any transcriptional errors that result from this process are unintentional.

## 2016-01-10 ENCOUNTER — Encounter: Payer: Self-pay | Admitting: Family Medicine

## 2016-01-10 DIAGNOSIS — R05 Cough: Secondary | ICD-10-CM

## 2016-01-10 DIAGNOSIS — R0902 Hypoxemia: Secondary | ICD-10-CM

## 2016-01-10 DIAGNOSIS — J90 Pleural effusion, not elsewhere classified: Secondary | ICD-10-CM

## 2016-01-10 DIAGNOSIS — C3431 Malignant neoplasm of lower lobe, right bronchus or lung: Secondary | ICD-10-CM

## 2016-01-10 DIAGNOSIS — Z7189 Other specified counseling: Secondary | ICD-10-CM

## 2016-01-10 DIAGNOSIS — Z515 Encounter for palliative care: Secondary | ICD-10-CM

## 2016-01-10 LAB — BASIC METABOLIC PANEL
Anion gap: 9 (ref 5–15)
BUN: 33 mg/dL — AB (ref 6–20)
CHLORIDE: 100 mmol/L — AB (ref 101–111)
CO2: 26 mmol/L (ref 22–32)
CREATININE: 1.09 mg/dL (ref 0.61–1.24)
Calcium: 8.7 mg/dL — ABNORMAL LOW (ref 8.9–10.3)
GFR calc Af Amer: >60 mL/min (ref >60)
GFR calc non Af Amer: >60 mL/min (ref >60)
GLUCOSE: 92 mg/dL (ref 65–99)
Potassium: 3.9 mmol/L (ref 3.5–5.1)
SODIUM: 135 mmol/L (ref 135–145)

## 2016-01-10 LAB — BLOOD CULTURE ID PANEL (REFLEXED)
Acinetobacter baumannii: NOT DETECTED
CANDIDA GLABRATA: NOT DETECTED
Candida albicans: NOT DETECTED
Candida krusei: NOT DETECTED
Candida parapsilosis: NOT DETECTED
Candida tropicalis: NOT DETECTED
ENTEROBACTER CLOACAE COMPLEX: NOT DETECTED
ENTEROBACTERIACEAE SPECIES: NOT DETECTED
ENTEROCOCCUS SPECIES: NOT DETECTED
Escherichia coli: NOT DETECTED
HAEMOPHILUS INFLUENZAE: NOT DETECTED
Klebsiella oxytoca: NOT DETECTED
Klebsiella pneumoniae: NOT DETECTED
LISTERIA MONOCYTOGENES: NOT DETECTED
METHICILLIN RESISTANCE: DETECTED — AB
NEISSERIA MENINGITIDIS: NOT DETECTED
PROTEUS SPECIES: NOT DETECTED
Pseudomonas aeruginosa: NOT DETECTED
STAPHYLOCOCCUS AUREUS BCID: NOT DETECTED
STAPHYLOCOCCUS SPECIES: DETECTED — AB
STREPTOCOCCUS AGALACTIAE: NOT DETECTED
STREPTOCOCCUS SPECIES: NOT DETECTED
Serratia marcescens: NOT DETECTED
Streptococcus pneumoniae: NOT DETECTED
Streptococcus pyogenes: NOT DETECTED

## 2016-01-10 LAB — CBC
HEMATOCRIT: 39.5 % — AB (ref 40.0–52.0)
HEMOGLOBIN: 13.3 g/dL (ref 13.0–18.0)
MCH: 29.8 pg (ref 26.0–34.0)
MCHC: 33.7 g/dL (ref 32.0–36.0)
MCV: 88.5 fL (ref 80.0–100.0)
Platelets: 152 10*3/uL (ref 150–440)
RBC: 4.46 MIL/uL (ref 4.40–5.90)
RDW: 16.9 % — ABNORMAL HIGH (ref 11.5–14.5)
WBC: 12 10*3/uL — ABNORMAL HIGH (ref 3.8–10.6)

## 2016-01-10 LAB — URINE CULTURE

## 2016-01-10 MED ORDER — SODIUM CHLORIDE 0.9 % IV BOLUS (SEPSIS): 500.0000 mL | Freq: Once | INTRAVENOUS | Status: DC

## 2016-01-10 MED ORDER — HYDROCOD POLST-CPM POLST ER 10-8 MG/5ML PO SUER
5.0000 mL | ORAL | Status: DC | PRN
  Administered 2016-01-10 – 2016-01-14 (×10): 5 mL via ORAL
  Filled 2016-01-10 (×10): qty 5

## 2016-01-10 MED ORDER — GUAIFENESIN ER 600 MG PO TB12
600.0000 mg | ORAL_TABLET | Freq: Two times a day (BID) | ORAL | Status: DC
  Administered 2016-01-10 – 2016-01-14 (×9): 600 mg via ORAL
  Filled 2016-01-10 (×9): qty 1

## 2016-01-10 MED ORDER — SODIUM CHLORIDE 0.9 % IV BOLUS (SEPSIS)
500.0000 mL | Freq: Once | INTRAVENOUS | Status: AC
  Administered 2016-01-10: 500 mL via INTRAVENOUS

## 2016-01-10 MED ORDER — GLYCOPYRROLATE 0.2 MG/ML IJ SOLN
0.2000 mg | INTRAMUSCULAR | Status: DC | PRN
  Administered 2016-01-10 – 2016-01-14 (×2): 0.2 mg via INTRAVENOUS
  Filled 2016-01-10 (×2): qty 1

## 2016-01-10 NOTE — Progress Notes (Addendum)
[facility] NAME: Meziah Blasingame    MR#:  [id number]  DATE OF BIRTH:  03-11-1934  SUBJECTIVE:  CHIEF COMPLAINT:   Chief Complaint  Patient presents with  . Nausea   The patient Has better nausea, no vomiting or diarrhea. He has severe cough and poor oral intake. Blood pressure is 86/45 this am, given NS bolus. Blood pressure is a little better later but patient developed severe cough and hypoxia. On oxygen [place] 2 L. REVIEW OF SYSTEMS:  Review of Systems  Constitutional: Positive for malaise/fatigue. Negative for chills and fever.  Respiratory: Positive for cough. Negative for hemoptysis and shortness of breath.   Cardiovascular: Negative for chest pain and leg swelling.  Gastrointestinal: Positive for nausea. Negative for abdominal pain, blood in stool, diarrhea, melena and vomiting.  Genitourinary: Negative for dysuria, frequency and hematuria.  Musculoskeletal:       Body pain, but the patient could not say where.  Neurological: Positive for weakness.   limited review of system due to patient's medical condition.  DRUG ALLERGIES:   Allergies  Allergen Reactions  . Penicillin G Hives    Has patient had a PCN reaction causing immediate rash, facial/tongue/throat swelling, SOB or lightheadedness with hypotension: Yes Has patient had a PCN reaction causing severe rash involving mucus membranes or skin necrosis: No Has patient had a PCN reaction that required hospitalization No Has patient had a PCN reaction occurring within the last 10 years: No If all of the above answers are "NO", then may proceed with Cephalosporin use.  . Sulfa Antibiotics Rash   VITALS:  Blood pressure 120/62, pulse 85, temperature 100.3 F (37.9 C), temperature source Oral, resp. rate 20, height '5\' 10"'$  (1.778 m), weight 172 lb 12.8 oz (78.4 kg), SpO2 90 %. PHYSICAL EXAMINATION:  Physical Exam  Constitutional: He is oriented to person, place, and time.    Lethargy and drowsy.  HENT:  Dry oral mucosa  Eyes: Conjunctivae and EOM are normal.  Neck: Neck supple. No JVD present. No tracheal deviation present.  Cardiovascular: Normal rate, regular rhythm and normal heart sounds.  Exam reveals no gallop.   No murmur heard. Pulmonary/Chest: Effort normal. No respiratory distress. He has no wheezes. He has no rales.  Almost no lung sounds and crackles on the right side  Abdominal: Soft. Bowel sounds are normal. He exhibits no distension. There is no tenderness.  Musculoskeletal: Normal range of motion. He exhibits no edema or tenderness.  Neurological: He is oriented to person, place, and time. No cranial nerve deficit.  Skin: No rash noted. No erythema.   LABORATORY PANEL:   CBC  Recent Labs Lab 01/10/16 0458  WBC 12.0*  HGB 13.3  HCT 39.5*  PLT 152   ------------------------------------------------------------------------------------------------------------------ Chemistries   Recent Labs Lab 01/09/16 0038 01/10/16 0458  NA 135 135  K 4.1 3.9  CL 100* 100*  CO2 23 26  GLUCOSE 166* 92  BUN 38* 33*  CREATININE 1.01 1.09  CALCIUM 9.2 8.7*  AST 25  --   ALT 13*  --   ALKPHOS 79  --   BILITOT 1.3*  --    RADIOLOGY:  No results found. ASSESSMENT AND PLAN:   This is a 81 year old male admitted for sepsis. 1. Sepsis with UTI and bacteremia with staph species. Continue vancomycin and Levaquin per Dr. Ola Spurr. Follow blood cultures and urine culture for growth and sensitivities.   Hypotension. BP 96/37. Given normal saline bolus  and monitor vital signs closely. Dehydration. Better with IV fluid support and follow-up BMP. Discontinue normal saline IV due to hypoxia and concern of history of CHF..  2. Hypoxia: Likely secondary to loculated effusions. Unchanged in appearance but the patient's clinical condition has worsened. Supply supplemental oxygen as needed. Per Dr. Genevive Bi,  [place] surgeon, no aggressive treatment after  discussed in this patient's POA.  3. Congestive heart failure: Diastolic; ventricular paced with AICD. discontinue Lasix and digoxin due to hypotension and bradycardia.  4. Hypertension: disontinued metoprolol. Continue hydrocortisone.  5. Hypothyroidism: Continue Synthroid 6. Hyperlipidemia: Continue statin therapy 7. Recurrent urinary tract infection: Continue methenamine. 8. 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 BPH: Continue Flomax  I discussed with Dr. Ola Spurr and palliative care team [facility] PT evaluation suggests skilled nursing facility placement. The patient's POA, daughter-in-law wants patient to be discharged to home after the patient is stable. The patient has critical condition and the prognosis. All the records are reviewed and case discussed with RN, Care Management/Social Worker. Management plans discussed with the patient, his daughter-in-law (POA) and they are in agreement.  CODE STATUS: DO NOT RESUSCITATE  TOTAL CRITICAL TIME TAKING CARE OF THIS PATIENT: 46 minutes.   More than 50% of the time was spent in counseling/coordination of care: YES  POSSIBLE D/C IN 3 DAYS, DEPENDING ON CLINICAL CONDITION.   Demetrios Loll M.D on 01/10/2016 at 3:24 PM  Between 7am to 6pm - Pager - [phone]  After 6pm go to [email or web address] - Proofreader  [facility]  Office  [phone]  CC: Primary care physician; [name], MD  Note: This dictation was prepared with Dragon dictation along with smaller phrase technology. Any transcriptional errors that result from this process are unintentional.

## 2016-01-10 NOTE — Evaluation (Signed)
Physical Therapy Evaluation Patient Details Name: Russell Howard MRN: [id number] DOB: 1934-11-01 Today's Date: 01/10/2016   History of Present Illness  81 y/o male with history of lung cancer as well as multiple thoracenteses for pleural effusion who came to ED complaining of nausea and shortness of breath. He's been getting weaker and weaker recently, but the last few days has been feeling particularly weak, fatigued and nauseous - with a few episodes of vomiting. He was hypoxic on room air, sats have been up and down.  Clinical Impression  Pt is very weak and not overly interested in participating with PT.  He did agree to do a little bit, but after sitting just a few minutes (and needing regular assist just to stay upright) he needed to lay back down and ultimately was very limited with what he could do.  His O2 was in the mid 80s on room air at rest and with light activity, pt feeling very poorly.  Daughter present and very much wanting to take him back home (not short term rehab) but does acknowledge that he is very weak and that it may be difficult to care for him.      Follow Up Recommendations SNF (pt and daughter are only interested in going home)    Equipment Recommendations       Recommendations for Other Services       Precautions / Restrictions Precautions Precautions: Fall Restrictions Weight Bearing Restrictions: No      Mobility  Bed Mobility Overal bed mobility: Needs Assistance Bed Mobility: Supine to Sit;Sit to Supine     Supine to sit: Mod assist;Max assist Sit to supine: Mod assist   General bed mobility comments: Pt needing a lot of assist to get himself to EOB and initially is unable to even sit upright w/o assist. He does show some short term abiltiy to maintain balance, but overall was very weak and showed limited tolerance.   Transfers                 General transfer comment: Pt initially seems willing to try standing with encouragement, but  after brief bout of sitting he fatigues and tries to lay back down before attempting to stand.  Ambulation/Gait                Stairs            Wheelchair Mobility    Modified Rankin (Stroke Patients Only)       Balance Overall balance assessment: Needs assistance Sitting-balance support: Bilateral upper extremity supported Sitting balance-Leahy Scale: Poor Sitting balance - Comments: Pt leaning back and to the side nearly the entire time, shows poor awareness and safety.                                     Pertinent Vitals/Pain Pain Assessment:  (complains of severe weakness more than pain)    Home Living Family/patient expects to be discharged to:: Private residence Living Arrangements: Children;Other relatives Available Help at Discharge: Family Type of Home: House Home Access: Ramped entrance     Home Layout: One level Home Equipment: [place] - 2 wheels;Wheelchair - manual      Prior Function Level of Independence: Needs assistance   Gait / Transfers Assistance Needed: room to room AMB with RW only     Comments: daughter does a lot regarding bathing, dressing, other ADLs  Hand Dominance        Extremity/Trunk Assessment   Upper Extremity Assessment: Generalized weakness (Pt shows little interest in participating with MMT)           Lower Extremity Assessment: Generalized weakness (Pt shows little interest in participating with MMT)         Communication   Communication: No difficulties  Cognition Arousal/Alertness: Lethargic Behavior During Therapy:  (uninterested in much participation) Overall Cognitive Status: Within Functional Limits for tasks assessed                      General Comments      Exercises     Assessment/Plan    PT Assessment Patient needs continued PT services  PT Problem List Decreased strength;Decreased activity tolerance;Decreased balance;Decreased range of motion;Decreased  mobility;Decreased safety awareness;Decreased knowledge of use of DME;Cardiopulmonary status limiting activity          PT Treatment Interventions DME instruction;Gait training;Functional mobility training;Therapeutic activities;Therapeutic exercise;Balance training;Patient/family education;Neuromuscular re-education    PT Goals (Current goals can be found in the Care Plan section)  Acute Rehab PT Goals Patient Stated Goal: go home PT Goal Formulation: With patient/family Time For Goal Achievement: 01/24/16 Potential to Achieve Goals: Fair    Frequency Min 2X/week   Barriers to discharge        Co-evaluation               End of Session   Activity Tolerance: Patient limited by fatigue Patient left: with family/visitor present;in bed;with call bell/phone within reach           Time: 1051-1118 PT Time Calculation (min) (ACUTE ONLY): 27 min   Charges:   PT Evaluation $PT Eval Low Complexity: 1 Procedure     PT G CodesKreg Shropshire , DPT 01/10/2016, 12:44 PM

## 2016-01-10 NOTE — Progress Notes (Signed)
Dr. Bridgett Larsson notified patient remains hypotensive (86/45, HR 108, Temp 98.8, 90% RA, 18 Resp). Patient c/o cough. Verbal order read back and verified for Tussinex and NS 525m bolus.

## 2016-01-10 NOTE — Progress Notes (Signed)
Previous note should state PO meds not PR meds.

## 2016-01-10 NOTE — Progress Notes (Signed)
Blood cultures +MRSA and +Staph. Contact isolation placed per protocol. Dr. Bridgett Larsson aware.

## 2016-01-10 NOTE — Progress Notes (Signed)
This patient is a 81 year old gentleman who has a long-standing history of right sided pleural effusion and parenchymal changes on the right secondary to a treated lung cancer. The patient was admitted to the hospital several days ago with increasing shortness of breath and cough. His family states that he's been quite debilitated at home and has been steadily losing functional status. Upon admission to the hospital he was adequately hydrated and treated with intravenous antibiotics. He did have a chest x-ray performed. This does reveal a right-sided loculated pleural effusion with some airspace opacities on the right and on the left. I was asked see the patient for consideration of decortication.  I had previously seen the patient about a year or so ago. At that time he declined any further intervention. I have reviewed his records from that point and there has been extensive consolidation of the right lung. I do not believe that he is a candidate for any surgical debridement.  On physical exam he is awake and alert. His lungs show markedly diminished breath sounds on the right. His heart regular. His left lung is clear.  I had a long discussion with his family and the patient. They do not want to be aggressive in their management. I did offer them a percutaneous chest tube to be placed in an declined that. They do not want to pursue any aggressive surgical intervention. I also discussed the possibility of asking the hospitalist services to come by again to review with him what may be available. They're in agreement for that.  Thank you very much for allowing me to participate in his care

## 2016-01-10 NOTE — Progress Notes (Signed)
PHARMACY - PHYSICIAN COMMUNICATION CRITICAL VALUE ALERT - BLOOD CULTURE IDENTIFICATION (BCID)  Results for orders placed or performed during the hospital encounter of 01/09/16  Blood Culture ID Panel (Reflexed) (Collected: 01/09/2016  4:32 AM)  Result Value Ref Range   Enterococcus species NOT DETECTED NOT DETECTED   Listeria monocytogenes NOT DETECTED NOT DETECTED   Staphylococcus species DETECTED (A) NOT DETECTED   Staphylococcus aureus NOT DETECTED NOT DETECTED   Methicillin resistance DETECTED (A) NOT DETECTED   Streptococcus species NOT DETECTED NOT DETECTED   Streptococcus agalactiae NOT DETECTED NOT DETECTED   Streptococcus pneumoniae NOT DETECTED NOT DETECTED   Streptococcus pyogenes NOT DETECTED NOT DETECTED   Acinetobacter baumannii NOT DETECTED NOT DETECTED   Enterobacteriaceae species NOT DETECTED NOT DETECTED   Enterobacter cloacae complex NOT DETECTED NOT DETECTED   Escherichia coli NOT DETECTED NOT DETECTED   Klebsiella oxytoca NOT DETECTED NOT DETECTED   Klebsiella pneumoniae NOT DETECTED NOT DETECTED   Proteus species NOT DETECTED NOT DETECTED   Serratia marcescens NOT DETECTED NOT DETECTED   Haemophilus influenzae NOT DETECTED NOT DETECTED   Neisseria meningitidis NOT DETECTED NOT DETECTED   Pseudomonas aeruginosa NOT DETECTED NOT DETECTED   Candida albicans NOT DETECTED NOT DETECTED   Candida glabrata NOT DETECTED NOT DETECTED   Candida krusei NOT DETECTED NOT DETECTED   Candida parapsilosis NOT DETECTED NOT DETECTED   Candida tropicalis NOT DETECTED NOT DETECTED    Name of physician (or Provider) Contacted: [place]  Changes to prescribed antibiotics required: None  [employer], Pharm.D., BCPS Clinical Pharmacist 01/10/2016  6:32 AM

## 2016-01-10 NOTE — Progress Notes (Addendum)
-  Patient is more alert today.  -Poor appetite continues.  -Refused all PO medications today except Mucinex.   -Persistent, productive cough throughout the day temporarily relieved by PRN Tussionex.  -IVF's discontinued.  -2L nasal cannula applied this afternoon due to oxygen saturations in low 80's on room air.  -Patient unable to tolerate sitting upright for long periods of time.  -C/o generalized aching pain but refused pain medication intervention- emotional support and frequent repositioning provided.

## 2016-01-11 DIAGNOSIS — R059 Cough, unspecified: Secondary | ICD-10-CM

## 2016-01-11 DIAGNOSIS — Z7189 Other specified counseling: Secondary | ICD-10-CM

## 2016-01-11 DIAGNOSIS — R05 Cough: Secondary | ICD-10-CM

## 2016-01-11 DIAGNOSIS — Z515 Encounter for palliative care: Secondary | ICD-10-CM

## 2016-01-11 LAB — CBC
HEMATOCRIT: 37 % — AB (ref 40.0–52.0)
Hemoglobin: 12.5 g/dL — ABNORMAL LOW (ref 13.0–18.0)
MCH: 29.7 pg (ref 26.0–34.0)
MCHC: 33.8 g/dL (ref 32.0–36.0)
MCV: 87.8 fL (ref 80.0–100.0)
Platelets: 137 10*3/uL — ABNORMAL LOW (ref 150–440)
RBC: 4.21 MIL/uL — ABNORMAL LOW (ref 4.40–5.90)
RDW: 16.7 % — AB (ref 11.5–14.5)
WBC: 9.4 10*3/uL (ref 3.8–10.6)

## 2016-01-11 LAB — BASIC METABOLIC PANEL
Anion gap: 6 (ref 5–15)
BUN: 19 mg/dL (ref 6–20)
CHLORIDE: 102 mmol/L (ref 101–111)
CO2: 26 mmol/L (ref 22–32)
CREATININE: 0.86 mg/dL (ref 0.61–1.24)
Calcium: 8.5 mg/dL — ABNORMAL LOW (ref 8.9–10.3)
GFR calc Af Amer: >60 mL/min (ref >60)
GFR calc non Af Amer: >60 mL/min (ref >60)
Glucose, Bld: 91 mg/dL (ref 65–99)
POTASSIUM: 3.9 mmol/L (ref 3.5–5.1)
SODIUM: 134 mmol/L — AB (ref 135–145)

## 2016-01-11 LAB — VANCOMYCIN, TROUGH: Vancomycin Tr: 12 ug/mL — ABNORMAL LOW (ref 15–20)

## 2016-01-11 MED ORDER — ORAL CARE MOUTH RINSE
15.0000 mL | Freq: Two times a day (BID) | OROMUCOSAL | Status: DC
  Administered 2016-01-11 – 2016-01-14 (×4): 15 mL via OROMUCOSAL

## 2016-01-11 MED ORDER — DOCUSATE SODIUM 100 MG PO CAPS: 100.0000 mg | ORAL_CAPSULE | Freq: Two times a day (BID) | ORAL | Status: DC | PRN

## 2016-01-11 MED ORDER — FUROSEMIDE 10 MG/ML IJ SOLN
20.0000 mg | Freq: Once | INTRAMUSCULAR | Status: AC
  Administered 2016-01-11: 15:00:00 20 mg via INTRAVENOUS
  Filled 2016-01-11: qty 2

## 2016-01-11 MED ORDER — VANCOMYCIN HCL IN DEXTROSE 1-5 GM/200ML-% IV SOLN
1000.0000 mg | Freq: Two times a day (BID) | INTRAVENOUS | Status: DC
  Administered 2016-01-12 – 2016-01-13 (×3): 1000 mg via INTRAVENOUS
  Filled 2016-01-11 (×5): qty 200

## 2016-01-11 NOTE — Progress Notes (Signed)
Physical Therapy Treatment Patient Details Name: Russell Howard MRN: [id number] DOB: 07-19-1934 Today's Date: 01/11/2016    History of Present Illness 81 y/o male with history of lung cancer as well as multiple thoracenteses for pleural effusion who came to ED complaining of nausea and shortness of breath. He's been getting weaker and weaker recently, but the last few days has been feeling particularly weak, fatigued and nauseous - with a few episodes of vomiting. He was hypoxic on room air, sats have been up and down.    PT Comments    Pt did considerably better with PT today and was willing and able to participate quite a bit more.  He is still very weak and guarded with most acts, but was able to do some exercises (even with light resistance), showed greatly improved mobility and sitting balance and was able to walk ~35 ft with walker and 2 liters O2.  He did need frequent rest breaks and considerable encouragement, but ultimately was able to show enough mobility that he, daughter and PT agreed that going home would at least be a safe option.  Pt's O2 was variable t/o session on 1 liter (and 2 for ambulation) but generally did stay in the 90s much of the time with post ambulation sats dropping to mid 80s, but able to recover with focused breathing.   Follow Up Recommendations  Home health PT (Pt still very weak, but improved enough HHPT is reasonable)     Equipment Recommendations       Recommendations for Other Services       Precautions / Restrictions Precautions Precautions: Fall Restrictions Weight Bearing Restrictions: No    Mobility  Bed Mobility Overal bed mobility: Needs Assistance Bed Mobility: Supine to Sit;Sit to Supine     Supine to sit: Min assist     General bed mobility comments: Pt with considerably more strength/energy today and was able to get himself up to sitting with only very light assist and maintained sitting balance with much more confidence and  tolerance today  Transfers Overall transfer level: Needs assistance Equipment used: Rolling walker (2 wheeled) Transfers: Sit to/from Stand Sit to Stand: Min guard;Min assist         General transfer comment: Pt needed light cuing for hand placement and set up, but actually did quite well getting up to standing and maintaining balance with walker  Ambulation/Gait Ambulation/Gait assistance: Min guard;Min assist Ambulation Distance (Feet): 35 Feet Assistive device: Rolling walker (2 wheeled)       General Gait Details: Pt showed ability to ambulate much more than he or his daughter expected and though he was slow and guarded and dis fatigue he showed good safety and good relative confidence.     Stairs            Wheelchair Mobility    Modified Rankin (Stroke Patients Only)       Balance   Sitting-balance support: Bilateral upper extremity supported Sitting balance-Leahy Scale: Good Sitting balance - Comments: Pt able to tolerate sitting at side of bed much better than yesterday.  Pt able to remain sitting at side of bed w/o LOBs, w/o excessive fatigue and generally was safe though cautious.   Standing balance support: Bilateral upper extremity supported Standing balance-Leahy Scale: Fair                      Cognition Arousal/Alertness: Awake/alert Behavior During Therapy: WFL for tasks assessed/performed Overall Cognitive Status: Within Functional Limits for  tasks assessed                      Exercises General Exercises - Lower Extremity Ankle Circles/Pumps: AROM;10 reps Short Arc Quad: Strengthening;10 reps Heel Slides: Strengthening;AROM;5 reps Hip ABduction/ADduction: AROM;Strengthening;5 reps    General Comments        Pertinent Vitals/Pain Pain Assessment:  ("very little," describes minimal pain with LE exercises)    Home Living                      Prior Function            PT Goals (current goals can now be  found in the care plan section) Progress towards PT goals: Progressing toward goals    Frequency    Min 2X/week      PT Plan Discharge plan needs to be updated    Co-evaluation             End of Session Equipment Utilized During Treatment: Gait belt Activity Tolerance: Patient limited by fatigue Patient left: in chair;with nursing/sitter in room;with call bell/phone within reach;with family/visitor present     Time: 2712-9290 PT Time Calculation (min) (ACUTE ONLY): 41 min  Charges:  $Gait Training: 8-22 mins $Therapeutic Exercise: 8-22 mins $Therapeutic Activity: 8-22 mins                    G Codes:      Kreg Shropshire, DPT 01/11/2016, 1:42 PM

## 2016-01-11 NOTE — Progress Notes (Signed)
Daily Progress Note   Patient Name: Russell Howard       Date: 01/12/2016 DOB: Nov 30, 1934  Age: 81 y.o. MRN#: [id number] Attending Physician: Theodoro Grist, MD Primary Care Physician: [name], MD Admit Date: 01/09/2016  Reason for Consultation/Follow-up: Establishing goals of care  Subjective: Patient awake, alert, and comfortably sitting in the chair this afternoon. No complaints of pain or discomfort. Cough is better. Daughter-in-law at bedside. She tells me he worked well with physical therapy and was able to walk to the door and back. Again discussed palliative services vs. Hospice services at home. She is hopeful he will continue to show improvements and have home physical therapy. She is open to having palliative services at home. Not ready to agree to hospice yet.   Length of Stay: 3  Current Medications: Scheduled Meds:  . acidophilus  1 capsule Oral Daily  . apixaban  5 mg Oral BID  . aspirin EC  81 mg Oral Daily  . gabapentin  300 mg Oral TID  . guaiFENesin  600 mg Oral BID  . hydrocortisone  10 mg Oral BID  . levofloxacin (LEVAQUIN) IV  750 mg Intravenous QHS  . levothyroxine  50 mcg Oral QAC breakfast  . mouth rinse  15 mL Mouth Rinse BID  . midodrine  5 mg Oral TID WC  . montelukast  10 mg Oral QPM  . pantoprazole  40 mg Oral QAC breakfast  . potassium chloride  10 mEq Oral Daily  . pyridostigmine  60 mg Oral BID  . senna  1 tablet Oral Daily  . vancomycin  1,000 mg Intravenous Q12H    Continuous Infusions:  PRN Meds: acetaminophen **OR** acetaminophen, chlorpheniramine-HYDROcodone, docusate sodium, glycopyrrolate, ondansetron **OR** ondansetron (ZOFRAN) IV, promethazine, senna  Physical Exam  Constitutional: He is oriented to person, place, and time. He is  cooperative.  Cardiovascular: Regular rhythm.  Exam reveals distant heart sounds.   Pulmonary/Chest: He has decreased breath sounds.  Abdominal: Soft. Bowel sounds are normal.  Neurological: He is alert and oriented to person, place, and time.  Skin: Skin is dry. There is pallor.  Psychiatric: He has a normal mood and affect. His speech is normal and behavior is normal. Cognition and memory are normal.  Nursing note and vitals reviewed.  Vital Signs: BP 110/66 (BP Location: Right Arm)   Pulse 84   Temp 97.9 F (36.6 C) (Oral)   Resp 18   Ht '5\' 10"'$  (1.778 m)   Wt 78.5 kg (173 lb)   SpO2 99%   BMI 24.82 kg/m  SpO2: SpO2: 99 % O2 Device: O2 Device: Nasal Cannula O2 Flow Rate: O2 Flow Rate (L/min): 2 L/min  Intake/output summary:   Intake/Output Summary (Last 24 hours) at 01/12/16 7846 Last data filed at 01/12/16 9629  Gross per 24 hour  Intake              480 ml  Output             1475 ml  Net             -995 ml   LBM: Last BM Date: 01/08/16 Baseline Weight: Weight: 76.2 kg (168 lb) Most recent weight: Weight: 78.5 kg (173 lb)       Palliative Assessment/Data: PPS 40%   Flowsheet Rows   Flowsheet Row Most Recent Value  Intake Tab  Referral Department  Hospitalist  Unit at Time of Referral  Oncology Unit  Palliative Care Primary Diagnosis  Cancer  Date Notified  01/09/16  Palliative Care Type  New Palliative care  Reason for referral  Clarify Goals of Care, Counsel Regarding Hospice  Date of Admission  01/09/16  # of days IP prior to Palliative referral  0  Clinical Assessment  Palliative Performance Scale Score  40%  Psychosocial & Spiritual Assessment  Palliative Care Outcomes  Patient/Family meeting held?  Yes  Who was at the meeting?  patient and daughter-in-law  Palliative Care Outcomes  Clarified goals of care, Counseled regarding hospice, Provided psychosocial or spiritual support, Improved non-pain symptom therapy      Patient Active  Problem List   Diagnosis Date Noted  . Palliative care encounter   . Goals of care, counseling/discussion   . Cough   . Encounter for hospice care discussion   . Hypoxia 01/09/2016  . Primary cancer of bronchus of right lower lobe ([place]) 11/29/2015  . Sepsis ([place]) 06/14/2015  . UTI (lower urinary tract infection) 06/14/2015    Class: Acute  . H/O malignant neoplasm 11/09/2014  . Acquired hypothyroidism 11/02/2014  . Cardiomyopathy ([place]) 11/02/2014  . Chronic diastolic heart failure ([place]) 11/02/2014  . Diabetes mellitus without complication ([place]) 52/84/1324  . Cardiac defibrillator in place 11/02/2014  . Gastro-esophageal reflux disease without esophagitis 11/02/2014  . History of atrial fibrillation 11/02/2014  . BP (high blood pressure) 11/02/2014  . Neuropathy ([place]) 11/02/2014  . Orthostasis 11/02/2014  . Pure hypercholesterolemia 11/02/2014  . Heart valve disease 11/02/2014    Palliative Care Assessment & Plan   Patient Profile: 81 y.o. male  with past medical history of valvular heart disease, chronic diastolic heart failure, cardiomyopathy, cardiac defibrillator, atrial fibrillation, GERD, BPH, hypothyroidism, and lung cancer admitted on 01/09/2016 with nausea and shortness of breath. Patient has had lung cancer since 2015, when he received chemo and radiation in [place]. Seen by Dr. Rogue Bussing. Patient has declined a biopsy for mass or further treatment in right lower lobe in January 2017. Also with long-standing loculated right pleural effusion with multiple past thoracenteses. In ED, hypoxic at 85% on room air. Patient was septic with UTI and bacteremia. ID consulted-patient currently receiving vanc and zosyn. CT surgery consult-patient and family do not want aggressive management or chest tube for pleural effusion. Patient also with hypotension and congestive heart  failure. Overall poor prognosis per attending. Palliative medicine consultation for goals of care.    Assessment: Sepsis with UTI and bacteremia Hypotension Hypoxia secondary to loculated effusions Chronic congestive diastolic heart failure AICD Hx of lung cancer   Recommendations/Plan:  DNR/DNI  Continue current medical interventions.  Discussed palliative services vs. Hospice services at home.   Care management consult for palliative services at home if discharged over the weekend.   PMT not at [facility] over the weekend but will f/u next week if still hospitalized.   Goals of Care and Additional Recommendations:  Limitations on Scope of Treatment: No Artificial Feeding, No Chemotherapy and No Radiation  Code Status: DNR   Code Status Orders        Start     Ordered   01/09/16 0659  Do not attempt resuscitation (DNR)  Continuous    Question Answer Comment  In the event of cardiac or respiratory ARREST Do not call a "code blue"   In the event of cardiac or respiratory ARREST Do not perform Intubation, CPR, defibrillation or ACLS   In the event of cardiac or respiratory ARREST Use medication by any route, position, wound care, and other measures to relive pain and suffering. May use oxygen, suction and manual treatment of airway obstruction as needed for comfort.      01/09/16 0658    Code Status History    Date Active Date Inactive Code Status Order ID Comments User Context   11/09/2015  3:18 AM 11/12/2015  5:13 PM DNR [id number]  Harrie Foreman, MD Inpatient   06/14/2015  4:39 PM 06/18/2015  3:13 PM DNR [id number]  Demetrios Loll, MD Inpatient    Advance Directive Documentation   Flowsheet Row Most Recent Value  Type of Advance Directive  Healthcare Power of Attorney, Living will  Pre-existing out of facility DNR order (yellow form or pink MOST form)  No data  "MOST" Form in Place?  No data       Prognosis:   Unable to determine   Discharge Planning:  Home with [facility] was discussed with patient, daughter-in-law, RN, and Dr. Bridgett Larsson  Thank  you for allowing the Palliative Medicine Team to assist in the care of this patient.   Time In: 1315 Time Out: 1350 Total Time 21mn Prolonged Time Billed  no       Greater than 50%  of this time was spent counseling and coordinating care related to the above assessment and plan.  MIhor Dow FNP-C Palliative Medicine Team  Phone: [phone]: [phone] Please contact Palliative Medicine Team phone at [phone] questions and concerns.

## 2016-01-11 NOTE — Clinical Social Work Note (Deleted)
MSW received referral for SNF.  Case discussed with case manager and plan is to discharge home with hospice.  MSW to sign off please re-consult if social work needs arise.  Russell Howard. Jazmina Muhlenkamp, MSW Mon-Fri 8a-4:30p [phone]

## 2016-01-11 NOTE — Progress Notes (Signed)
[employer] INFECTIOUS DISEASE PROGRESS NOTE Date of Admission:  01/09/2016     ID: Russell Howard is a 81 y.o. male with PNA Active Problems:   Hypoxia   Palliative care encounter   Goals of care, counseling/discussion   Cough   Encounter for hospice care discussion   Subjective: No fevers, slightly better cough   ROS  Eleven systems are reviewed and negative except per hpi  Medications:  Antibiotics Given (last 72 hours)    Date/Time Action Medication Dose Rate   01/09/16 0650 Given   vancomycin (VANCOCIN) IVPB 1000 mg/200 mL premix 1,000 mg 200 mL/hr   01/10/16 0011 Given   levofloxacin (LEVAQUIN) IVPB 750 mg 750 mg 100 mL/hr   01/10/16 0011 Given   vancomycin (VANCOCIN) IVPB 750 mg/150 ml premix 750 mg 150 mL/hr   01/10/16 0946 Given   vancomycin (VANCOCIN) IVPB 750 mg/150 ml premix 750 mg 150 mL/hr   01/10/16 2037 Given  [pt request]   levofloxacin (LEVAQUIN) IVPB 750 mg 750 mg 100 mL/hr   01/10/16 2037 Given  [pt request]   vancomycin (VANCOCIN) IVPB 750 mg/150 ml premix 750 mg 150 mL/hr   01/11/16 7341 Given   methenamine (HIPREX) tablet 1 g 1 g    01/11/16 1220 Given   vancomycin (VANCOCIN) IVPB 750 mg/150 ml premix 750 mg 150 mL/hr     . acidophilus  1 capsule Oral Daily  . apixaban  5 mg Oral BID  . aspirin EC  81 mg Oral Daily  . gabapentin  300 mg Oral TID  . guaiFENesin  600 mg Oral BID  . hydrocortisone  10 mg Oral BID  . levofloxacin (LEVAQUIN) IV  750 mg Intravenous QHS  . levothyroxine  50 mcg Oral QAC breakfast  . mouth rinse  15 mL Mouth Rinse BID  . midodrine  5 mg Oral TID WC  . montelukast  10 mg Oral QPM  . pantoprazole  40 mg Oral QAC breakfast  . potassium chloride  10 mEq Oral Daily  . pyridostigmine  60 mg Oral BID  . vancomycin  750 mg Intravenous BID    Objective: Vital signs in last 24 hours: Temp:  [97.5 F (36.4 C)-99.6 F (37.6 C)] 97.5 F (36.4 C) (12/08 1233) Pulse Rate:  [77-119] 109 (12/08 1233) Resp:  [16-18] 18  (12/08 0854) BP: (85-123)/(47-62) 123/62 (12/08 1233) SpO2:  [94 %-99 %] 94 % (12/08 1233) Weight:  [78.1 kg (172 lb 3.2 oz)] 78.1 kg (172 lb 3.2 oz) (12/08 0500) Constitutional: frail, ill appearing, coughing  HENT: anicteric  Mouth/Throat: Oropharynx is clear and dry. No oropharyngeal exudate.  Cardiovascular: tachy Pulmonary/Chest: poor air movement,  Abdominal: Soft. Bowel sounds are normal. He exhibits no distension. There is no tenderness.  Lymphadenopathy:  He has no cervical adenopathy.  Neurological: He is alert and oriented to person, place, and time.  Skin: Skin is warm and dry. No rash noted. No erythema.  Psychiatric: He has a normal mood and affect. His behavior is normal.  Lab Results  Recent Labs  01/10/16 0458 01/11/16 0429  WBC 12.0* 9.4  HGB 13.3 12.5*  HCT 39.5* 37.0*  NA 135 134*  K 3.9 3.9  CL 100* 102  CO2 26 26  BUN 33* 19  CREATININE 1.09 0.86    Microbiology: Results for orders placed or performed during the hospital encounter of 01/09/16  Urine culture     Status: Abnormal   Collection Time: 01/09/16  1:41 AM  Result Value  Ref Range Status   Specimen Description URINE, RANDOM  Final   Special Requests NONE  Final   Culture (A)  Final    <10,000 COLONIES/mL INSIGNIFICANT GROWTH Performed at [facility]    Report Status 01/10/2016 FINAL  Final  Blood culture (routine x 2)     Status: None (Preliminary result)   Collection Time: 01/09/16  4:32 AM  Result Value Ref Range Status   Specimen Description BLOOD  R AC  Final   Special Requests   Final    BOTTLES DRAWN AEROBIC AND ANAEROBIC  AER 14 ML ANA 12 ML    Culture NO GROWTH 2 DAYS  Final   Report Status PENDING  Incomplete  Blood culture (routine x 2)     Status: Abnormal (Preliminary result)   Collection Time: 01/09/16  4:32 AM  Result Value Ref Range Status   Specimen Description BLOOD BLOOD LEFT WRIST  Final   Special Requests   Final    BOTTLES DRAWN AEROBIC AND ANAEROBIC   AER 11 ML ANA 10 ML   Culture  Setup Time   Final    Organism ID to follow GRAM POSITIVE COCCI IN BOTH AEROBIC AND ANAEROBIC BOTTLES CRITICAL RESULT CALLED TO, READ BACK BY AND VERIFIED WITH: [place] AT 7371 01/10/16 SDR    Culture (A)  Final    STAPHYLOCOCCUS SPECIES (COAGULASE NEGATIVE) THE SIGNIFICANCE OF ISOLATING THIS ORGANISM FROM A SINGLE SET OF BLOOD CULTURES WHEN MULTIPLE SETS ARE DRAWN IS UNCERTAIN. PLEASE NOTIFY THE MICROBIOLOGY DEPARTMENT WITHIN ONE WEEK IF SPECIATION AND SENSITIVITIES ARE REQUIRED. Performed at [facility]    Report Status PENDING  Incomplete  Blood Culture ID Panel (Reflexed)     Status: Abnormal   Collection Time: 01/09/16  4:32 AM  Result Value Ref Range Status   Enterococcus species NOT DETECTED NOT DETECTED Final   Listeria monocytogenes NOT DETECTED NOT DETECTED Final   Staphylococcus species DETECTED (A) NOT DETECTED Final    Comment: CRITICAL RESULT CALLED TO, READ BACK BY AND VERIFIED WITH:  NATE COOKSON AT 0547 01/10/16 SDR    Staphylococcus aureus NOT DETECTED NOT DETECTED Final   Methicillin resistance DETECTED (A) NOT DETECTED Final    Comment: CRITICAL RESULT CALLED TO, READ BACK BY AND VERIFIED WITH:  NATE COOKSON AT 0547 01/10/16 SDR    Streptococcus species NOT DETECTED NOT DETECTED Final   Streptococcus agalactiae NOT DETECTED NOT DETECTED Final   Streptococcus pneumoniae NOT DETECTED NOT DETECTED Final   Streptococcus pyogenes NOT DETECTED NOT DETECTED Final   Acinetobacter baumannii NOT DETECTED NOT DETECTED Final   Enterobacteriaceae species NOT DETECTED NOT DETECTED Final   Enterobacter cloacae complex NOT DETECTED NOT DETECTED Final   Escherichia coli NOT DETECTED NOT DETECTED Final   Klebsiella oxytoca NOT DETECTED NOT DETECTED Final   Klebsiella pneumoniae NOT DETECTED NOT DETECTED Final   Proteus species NOT DETECTED NOT DETECTED Final   Serratia marcescens NOT DETECTED NOT DETECTED Final   Haemophilus  influenzae NOT DETECTED NOT DETECTED Final   Neisseria meningitidis NOT DETECTED NOT DETECTED Final   Pseudomonas aeruginosa NOT DETECTED NOT DETECTED Final   Candida albicans NOT DETECTED NOT DETECTED Final   Candida glabrata NOT DETECTED NOT DETECTED Final   Candida krusei NOT DETECTED NOT DETECTED Final   Candida parapsilosis NOT DETECTED NOT DETECTED Final   Candida tropicalis NOT DETECTED NOT DETECTED Final  MRSA PCR Screening     Status: None   Collection Time: 01/09/16  8:57 AM  Result Value Ref Range Status   MRSA by PCR NEGATIVE NEGATIVE Final    Comment:        The GeneXpert MRSA Assay (FDA approved for NASAL specimens only), is one component of a comprehensive MRSA colonization surveillance program. It is not intended to diagnose MRSA infection nor to guide or monitor treatment for MRSA infections.     Studies/Results: No results found.  Assessment/Plan: Russell Howard is a 81 y.o. male with lung cancer loculated R pleural effusions, prior admission in Oct for obstructive PNA, now with recurrent SOB, hypoxia, fevers and leukocytosis. Allergic to PCN and sulfa.  UA TNTC WBC. 1/2 BCX + Coag neg staph Feels a little better, cough improving   Recommendations COntinue vanco and levo while inpatient. Unable to produce sputum If decompensates add aztreonam   When ready for dc would send home on levofloxacin and doxycycline for a total of 7-10 days of total abx from admission  Thank you very much for the consult. Will follow with you.  Russell Howard P   01/11/2016, 4:00 PM

## 2016-01-11 NOTE — Progress Notes (Signed)
[facility] [place] NAME: Mays Paino    MR#:  [id number]  DATE OF BIRTH:  November 20, 1934  SUBJECTIVE:  CHIEF COMPLAINT:   Chief Complaint  Patient presents with  . Nausea   The patient no nausea, no vomiting or diarrhea. He has better cough and poor oral intake. Blood pressure is better. On oxygen [place] 2 L. REVIEW OF SYSTEMS:  Review of Systems  Constitutional: Positive for malaise/fatigue. Negative for chills and fever.  Respiratory: Positive for cough. Negative for hemoptysis and shortness of breath.   Cardiovascular: Negative for chest pain and leg swelling.  Gastrointestinal: Negative for abdominal pain, blood in stool, diarrhea, melena, nausea and vomiting.  Genitourinary: Negative for dysuria, frequency and hematuria.  Neurological: Positive for weakness.   limited review of system due to patient's medical condition.  DRUG ALLERGIES:   Allergies  Allergen Reactions  . Penicillin G Hives    Has patient had a PCN reaction causing immediate rash, facial/tongue/throat swelling, SOB or lightheadedness with hypotension: Yes Has patient had a PCN reaction causing severe rash involving mucus membranes or skin necrosis: No Has patient had a PCN reaction that required hospitalization No Has patient had a PCN reaction occurring within the last 10 years: No If all of the above answers are "NO", then may proceed with Cephalosporin use.  . Sulfa Antibiotics Rash   VITALS:  Blood pressure 123/62, pulse (!) 109, temperature 97.5 F (36.4 C), temperature source Oral, resp. rate 18, height '5\' 10"'$  (1.778 m), weight 172 lb 3.2 oz (78.1 kg), SpO2 94 %. PHYSICAL EXAMINATION:  Physical Exam  Constitutional: He is oriented to person, place, and time.  HENT:  Dry oral mucosa  Eyes: Conjunctivae and EOM are normal.  Neck: Neck supple. No JVD present. No tracheal deviation present.  Cardiovascular: Normal rate, regular rhythm and normal heart sounds.   Exam reveals no gallop.   No murmur heard. Pulmonary/Chest: Effort normal. No respiratory distress. He has no wheezes. He has rales.  Almost no lung sounds and crackles on the right side. Rales on the left side.  Abdominal: Soft. Bowel sounds are normal. He exhibits no distension. There is no tenderness.  Musculoskeletal: Normal range of motion. He exhibits no edema or tenderness.  Neurological: He is oriented to person, place, and time. No cranial nerve deficit.  Skin: No rash noted. No erythema.   LABORATORY PANEL:   CBC  Recent Labs Lab 01/11/16 0429  WBC 9.4  HGB 12.5*  HCT 37.0*  PLT 137*   ------------------------------------------------------------------------------------------------------------------ Chemistries   Recent Labs Lab 01/09/16 0038  01/11/16 0429  NA 135  < > 134*  K 4.1  < > 3.9  CL 100*  < > 102  CO2 23  < > 26  GLUCOSE 166*  < > 91  BUN 38*  < > 19  CREATININE 1.01  < > 0.86  CALCIUM 9.2  < > 8.5*  AST 25  --   --   ALT 13*  --   --   ALKPHOS 79  --   --   BILITOT 1.3*  --   --   < > = values in this interval not displayed. RADIOLOGY:  No results found. ASSESSMENT AND PLAN:   This is a 81 year old male admitted for sepsis. 1. Sepsis with UTI and bacteremia with staph species. Continue vancomycin and Levaquin per Dr. Ola Spurr. Follow blood cultures and urine culture for growth and sensitivities.   Hypotension.improved  with normal saline bolus and monitor vital signs closely. Dehydration. Improved with with IV fluid support  Discontinued normal saline IV due to hypoxia and concern of history of CHF..  2. Hypoxia: Likely secondary to loculated effusions.   Supply supplemental oxygen as needed. Per Dr. Genevive Bi,  [facility] [place] surgeon, no aggressive treatment after discussed in this patient's POA.  3. Chronic congestive heart failure: Diastolic; ventricular paced with AICD. discontinue Lasix and digoxin due to hypotension and bradycardia. Resume digoxin  and give one dose lasix iv today.  4. Hypertension: disontinued metoprolol. Continue hydrocortisone.  5. Hypothyroidism: Continue Synthroid 6. Hyperlipidemia: Continue statin therapy 7. Recurrent urinary tract infection: Continue methenamine. 8. 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 BPH: Continue Flomax  I discussed with palliative care team Megan. PT evaluation suggests HHPT. The patient's POA, daughter-in-law wants patient to be discharged to home after the patient is stable. The patient has critical condition and the prognosis. All the records are reviewed and case discussed with RN, Care Management/Social Worker. Management plans discussed with the patient, his daughter-in-law (POA) and they are in agreement.  CODE STATUS: DO NOT RESUSCITATE  TOTAL TIME TAKING CARE OF THIS PATIENT: 37 minutes.   More than 50% of the time was spent in counseling/coordination of care: YES  POSSIBLE D/C IN 2-3 DAYS, DEPENDING ON CLINICAL CONDITION.   Demetrios Loll M.D on 01/11/2016 at 2:27 PM  Between 7am to 6pm - Pager - [phone]  After 6pm go to [email or web address] - Proofreader  [facility] [place] [place]  Office  [phone]  CC: Primary care physician; [name], MD  Note: This dictation was prepared with Dragon dictation along with smaller phrase technology. Any transcriptional errors that result from this process are unintentional.

## 2016-01-11 NOTE — Progress Notes (Addendum)
Advanced Care Plan.  Purpose of Encounter: Hospice care. Parties in Attendance: The patient, his daughter-in-law (POA), RN and me. Patient's Decisional Capacity: Yes. Subjective/Patient Story: The patient with past medical history of lung cancer as well as multiple thoracenteses for pleural effusion presents emergency department complaining of nausea and shortness of breath.  Objective/Medical Story: He was found to have sepsis with UTI, treated with antibiotics. He was hypotensive, given normal saline bolus. In addition, he had nausea, vomiting and dehydration. his blood pressure is better but  he developed hypoxia, put on oxygen [place]. His general condition is declining. I discussed with his daughter-in-law about poor prognosis and hospice care at home. The daughter-in-law wants the patient to be discharged to home with home health and PT. She said she will consider hospice care at home. I discussed with palliative care team Megan.  Goals of Care Determinations: Hospice care at home.  Plan:  Code Status: DO NOT RESUSCITATE. POA will consider hospice care at home.  Time spent discussing advance care planning: 18 minutes.

## 2016-01-11 NOTE — Progress Notes (Signed)
Pharmacy Antibiotic Note  Russell Howard is a 81 y.o. male admitted on 01/09/2016 with sepsis.  Pharmacy has been consulted for levofloxacin/vancomycin dosing.  Plan: 1. Levofloxacin 750 mg IV Q24H 2. VT 12/8 2123 12 mcg/mL. Increase to 1 gm IV Q12H starting tomorrow at 0800. Predicted trough 17 mcg/ml. Pharmacy will continue to follow and adjust as needed to maintain trough 15 to 20 mcg/mL.  Height: '5\' 10"'$  (177.8 cm) Weight: 172 lb 3.2 oz (78.1 kg) IBW/kg (Calculated) : 73  Temp (24hrs), Avg:98.1 F (36.7 C), Min:97.5 F (36.4 C), Max:98.8 F (37.1 C)   Recent Labs Lab 01/09/16 0038 01/09/16 1910 01/09/16 2233 01/10/16 0458 01/11/16 0429 01/11/16 2123  WBC 12.9*  --   --  12.0* 9.4  --   CREATININE 1.01  --   --  1.09 0.86  --   LATICACIDVEN 1.3 2.3* 1.0  --   --   --   VANCOTROUGH  --   --   --   --   --  12*    Estimated Creatinine Clearance: 73.1 mL/min (by C-G formula based on SCr of 0.86 mg/dL).    Allergies  Allergen Reactions  . Penicillin G Hives    Has patient had a PCN reaction causing immediate rash, facial/tongue/throat swelling, SOB or lightheadedness with hypotension: Yes Has patient had a PCN reaction causing severe rash involving mucus membranes or skin necrosis: No Has patient had a PCN reaction that required hospitalization No Has patient had a PCN reaction occurring within the last 10 years: No If all of the above answers are "NO", then may proceed with Cephalosporin use.  . Sulfa Antibiotics Rash   Thank you for allowing pharmacy to be a part of this patient's care.  Laural Benes, Pharm.D., BCPS Clinical Pharmacist 01/11/2016 10:24 PM

## 2016-01-11 NOTE — Care Management Important Message (Signed)
Important Message  Patient Details  Name: Russell Howard MRN: [id number] Date of Birth: Aug 26, 1934   Medicare Important Message Given:  Yes    Shelbie Ammons, RN 01/11/2016, 10:03 AM

## 2016-01-11 NOTE — Consult Note (Signed)
Consultation Note Date: 01/10/16   Patient Name: Russell Howard  DOB: September 30, 1934  MRN: [id number]  Age / Sex: 81 y.o., male  PCP: [name], MD Referring Physician: Demetrios Loll, MD  Reason for Consultation: Establishing goals of care  HPI/Patient Profile: 81 y.o. male  with past medical history of valvular heart disease, chronic diastolic heart failure, cardiomyopathy, cardiac defibrillator, atrial fibrillation, GERD, BPH, hypothyroidism, and lung cancer admitted on 01/09/2016 with nausea and shortness of breath. Patient has had lung cancer since 2015, when he received chemo and radiation in [place]. Seen by Dr. Rogue Bussing. Patient has declined a biopsy for mass or further treatment in right lower lobe in January 2017. Also with long-standing loculated right pleural effusion with multiple past thoracenteses. In ED, hypoxic at 85% on room air. Patient was septic with UTI and bacteremia. ID consulted-patient currently receiving vanc and zosyn. CT surgery consult-patient and family do not want aggressive management or chest tube for pleural effusion. Patient also with hypotension and congestive heart failure. Overall poor prognosis per attending. Palliative medicine consultation for goals of care.   Clinical Assessment and Goals of Care: Met with patient and daughter-in-law Russell Howard) at bedside. Introduced palliative care. Patient has lived with son and daughter-in-law for one year. Prior to that, he was back and forth from [place] every 6 months. Family is very supportive. He sleeps till noon some days, eats lunch, and they go out to eat every night. He has a good appetite. Very religous individual. Attends church every Wednesday and twice on sundays. He has been able to ambulate with minimal assist. Russell Howard tells me has greatly declined in the past week.    Discussed in detail current medical status and interventions along  with co-morbidities that are contributing. Mr. Butt and Russell Howard adamantly tell me they do not want aggressive interventions such as resuscitation, intubation, chemo, radiation, or surgical procedures for pleural effusion. They do still want to continue antibiotics for UTI and bacteremia. Discussed my concern with co-morbidities, especially CHF, that has made it challenging to treat hypotension and sepsis.   When asked what Mr. Pickard is hopeful for, he states "for the Lord to take me if its my time." He becomes very tearful and hopes he will go to heaven. He seems realistic that he may be near the end-of-life but not ready to stop current medical interventions to treat infection.   Physical therapy recommends SNF for rehab. Russell Howard and patient tell me he will not go to a facility and will only go home with his family. Discussed palliative services to follow vs. Hospice at home. Russell Howard is concerned with him receiving morphine to hasten his death. She does not 'want to just give up on him" and  "give him morphine to die." Discussed that the hospice focus is comfort, quality, and dignity for each day he may have left and focus on symptom management, but only giving medications if necessary. Russell Howard is still hesitant to accept hospice at this time.   Answered questions. Emotional and spiritual support  given.   HCPOA-daughter-in-law Russell Howard   SUMMARY OF RECOMMENDATIONS    DNR/DNI.  Continue current medical interventions.   Discussed support from palliative or hospice services at home. No decisions have been made.   PMT will continue to follow patient during hospitalization and further discuss GOC, poor prognosis, and hospice.   Code Status/Advance Care Planning:  DNR   Symptom Management:   Tussionex 104m PO q4h prn cough  Robinul 0.'2mg'$  IV q2h prn secretions  Palliative Prophylaxis:   Aspiration, Delirium Protocol, Frequent Pain Assessment, Oral Care and Turn Reposition  Additional Recommendations  (Limitations, Scope, Preferences):  Full Scope Treatment  Psycho-social/Spiritual:   Desire for further Chaplaincy support:yes  Additional Recommendations: Caregiving  Support/Resources and Education on Hospice  Prognosis:   Unable to determine  Discharge Planning: To Be Determined      Primary Diagnoses: Present on Admission: . Hypoxia   I have reviewed the medical record, interviewed the patient and family, and examined the patient. The following aspects are pertinent.  Past Medical History:  Diagnosis Date  . Acquired hypothyroidism 11/02/2014  . BPH (benign prostatic hyperplasia)   . Cardiac defibrillator in place 11/02/2014  . Cardiomyopathy ([facility]   . CHF (congestive heart failure) ([facility]   . Chronic diastolic heart failure ([facility] 11/02/2014  . Gastro-esophageal reflux disease without esophagitis 11/02/2014  . H/O malignant neoplasm 11/09/2014  . Heart valve disease 11/02/2014  . History of atrial fibrillation 11/02/2014  . Lung cancer ([facility]   . Neuropathy ([facility] 11/02/2014  . Orthostasis 11/02/2014  . Pure hypercholesterolemia 11/02/2014  . Valvular heart disease    Social History   Social History  . Marital status: Widowed    Spouse name: N/A  . Number of children: N/A  . Years of education: N/A   Social History Main Topics  . Smoking status: Former Smoker    Types: Cigarettes    Quit date: 11/21/1981  . Smokeless tobacco: Never Used  . Alcohol use No  . Drug use: No  . Sexual activity: Not Currently   Other Topics Concern  . None   Social History Narrative  . None   Family History  Problem Relation Age of Onset  . Hypertension    . Heart disease     Scheduled Meds: . acidophilus  1 capsule Oral Daily  . apixaban  5 mg Oral BID  . aspirin EC  81 mg Oral Daily  . cholecalciferol  5,000 Units Oral q morning - 10a  . cyanocobalamin  500 mcg Oral Daily  . docusate sodium  100 mg Oral BID  . famotidine (PEPCID) IV  20 mg Intravenous Q24H  .  gabapentin  300 mg Oral TID  . guaiFENesin  600 mg Oral BID  . hydrocortisone  10 mg Oral BID  . levofloxacin (LEVAQUIN) IV  750 mg Intravenous QHS  . levothyroxine  50 mcg Oral QAC breakfast  . magnesium oxide  400 mg Oral Daily  . methenamine  1 g Oral Daily  . midodrine  5 mg Oral TID WC  . montelukast  10 mg Oral QPM  . multivitamin with minerals  1 tablet Oral Daily  . pantoprazole  40 mg Oral QAC breakfast  . potassium chloride  10 mEq Oral Daily  . pyridostigmine  60 mg Oral BID  . simvastatin  20 mg Oral q1800  . vancomycin  750 mg Intravenous BID  . vitamin C  500 mg Oral TID  . vitamin E  400 Units Oral Daily  Continuous Infusions: PRN Meds:.acetaminophen **OR** acetaminophen, chlorpheniramine-HYDROcodone, glycopyrrolate, ondansetron **OR** ondansetron (ZOFRAN) IV, promethazine Medications Prior to Admission:  Prior to Admission medications   Medication Sig Start Date End Date Taking? Authorizing Provider  acidophilus (RISAQUAD) CAPS capsule Take 1 capsule by mouth daily.   Yes Historical Provider, MD  apixaban (ELIQUIS) 5 MG TABS tablet Take 1 tablet (5 mg total) by mouth 2 (two) times daily. 06/24/15  Yes Epifanio Lesches, MD  Ascorbic Acid (VITAMIN C) 1000 MG tablet Take 500 mg by mouth 3 (three) times daily. Reported on 06/14/2015   Yes Historical Provider, MD  aspirin EC 81 MG tablet Take 81 mg by mouth daily.    Yes Historical Provider, MD  Cholecalciferol 5000 units TABS Take 5,000 Units by mouth every morning.   Yes Historical Provider, MD  Coenzyme Q10 (TH CO Q-10) 100 MG capsule Take 100 mg by mouth daily.    Yes Historical Provider, MD  [place] 250 MCG tablet Take 250 mcg by mouth daily.   Yes Historical Provider, MD  furosemide (LASIX) 20 MG tablet Take 20 mg by mouth every other day as needed (as needed for water retention).  05/10/15  Yes Historical Provider, MD  gabapentin (NEURONTIN) 300 MG capsule Take 300 mg by mouth 3 (three) times daily.  12/20/14  Yes  Historical Provider, MD  hydrocortisone (CORTEF) 10 MG tablet Take 10 mg by mouth 2 (two) times daily.    Yes Historical Provider, MD  levothyroxine (SYNTHROID, LEVOTHROID) 50 MCG tablet Take 50 mcg by mouth daily before breakfast.   Yes Historical Provider, MD  MAGNESIUM-OXIDE 400 (241.3 Mg) MG tablet Take 400 mg by mouth daily.   Yes Historical Provider, MD  methenamine (HIPREX) 1 g tablet TAKE 1 TABLET(1 GRAM) BY MOUTH TWICE DAILY WITH A MEAL 12/11/15  Yes Hollice Espy, MD  midodrine (PROAMATINE) 5 MG tablet Take 5 mg by mouth 3 (three) times daily with meals.   Yes Historical Provider, MD  montelukast (SINGULAIR) 10 MG tablet Take 10 mg by mouth every evening.   Yes Historical Provider, MD  Multiple Vitamin (MULTIVITAMIN WITH MINERALS) TABS tablet Take 1 tablet by mouth daily.   Yes Historical Provider, MD  pantoprazole (PROTONIX) 40 MG tablet Take 40 mg by mouth daily before breakfast.    Yes Historical Provider, MD  potassium chloride (MICRO-K) 10 MEQ CR capsule Take 10 mEq by mouth daily.    Yes Historical Provider, MD  pyridostigmine (MESTINON) 60 MG tablet Take 60 mg by mouth 2 (two) times daily.  05/07/15  Yes Historical Provider, MD  ranitidine (ZANTAC) 300 MG tablet Take 300 mg by mouth at bedtime.   Yes Historical Provider, MD  SAW PALMETTO-PUMPKIN SEED OIL PO Take 1 capsule by mouth daily.    Yes Historical Provider, MD  simvastatin (ZOCOR) 20 MG tablet Take 20 mg by mouth daily at 6 PM.    Yes Historical Provider, MD  vitamin B-12 (CYANOCOBALAMIN) 500 MCG tablet Take 500 mcg by mouth daily.    Yes Historical Provider, MD  vitamin E 400 UNIT capsule Take 400 Units by mouth daily.    Yes Historical Provider, MD  levofloxacin (LEVAQUIN) 750 MG tablet Take 1 tablet (750 mg total) by mouth daily. Patient not taking: Reported on 01/09/2016 11/12/15   Hillary Bow, MD  metoprolol succinate (TOPROL XL) 25 MG 24 hr tablet Take 1 tablet (25 mg total) by mouth daily. Patient not taking: Reported  on 01/09/2016 09/07/15 09/06/16  Rudene Re, MD  Allergies  Allergen Reactions  . Penicillin G Hives    Has patient had a PCN reaction causing immediate rash, facial/tongue/throat swelling, SOB or lightheadedness with hypotension: Yes Has patient had a PCN reaction causing severe rash involving mucus membranes or skin necrosis: No Has patient had a PCN reaction that required hospitalization No Has patient had a PCN reaction occurring within the last 10 years: No If all of the above answers are "NO", then may proceed with Cephalosporin use.  . Sulfa Antibiotics Rash   Review of Systems  Constitutional: Positive for activity change, appetite change, fatigue and fever.  Respiratory: Positive for cough and shortness of breath.   Neurological: Positive for weakness.    Physical Exam  Constitutional: He is oriented to person, place, and time. He is cooperative. He appears ill. He appears distressed.  Cardiovascular: Regular rhythm.  Exam reveals distant heart sounds.   Pulmonary/Chest: He is in respiratory distress. He has rhonchi.  Crackles. Constant cough throughout meeting  Abdominal: Soft. Bowel sounds are normal.  Neurological: He is alert and oriented to person, place, and time.  Skin: Skin is warm and dry. There is pallor.  Psychiatric: He has a normal mood and affect. His speech is normal and behavior is normal. Cognition and memory are normal.  Nursing note and vitals reviewed.  Vital Signs: BP (!) 85/47 (BP Location: Right Arm)   Pulse 82   Temp 98.1 F (36.7 C)   Resp 16   Ht '5\' 10"'$  (1.778 m)   Wt 78.1 kg (172 lb 3.2 oz)   SpO2 95%   BMI 24.71 kg/m  Pain Assessment: No/denies pain   Pain Score: 0-No pain  SpO2: SpO2: 95 % O2 Device:SpO2: 95 % O2 Flow Rate: .O2 Flow Rate (L/min): 2 L/min  IO: Intake/output summary:   Intake/Output Summary (Last 24 hours) at 01/11/16 0803 Last data filed at 01/11/16 0754  Gross per 24 hour  Intake              350 ml  Output              1500 ml  Net            -1150 ml    LBM: Last BM Date: 01/08/16 Baseline Weight: Weight: 76.2 kg (168 lb) Most recent weight: Weight: 78.1 kg (172 lb 3.2 oz)     Palliative Assessment/Data: PPS 30%   Flowsheet Rows   Flowsheet Row Most Recent Value  Intake Tab  Referral Department  Hospitalist  Unit at Time of Referral  Oncology Unit  Palliative Care Primary Diagnosis  Cancer  Date Notified  01/09/16  Palliative Care Type  New Palliative care  Reason for referral  Clarify Goals of Care, Counsel Regarding Hospice  Date of Admission  01/09/16  # of days IP prior to Palliative referral  0  Clinical Assessment  Palliative Performance Scale Score  30%  Psychosocial & Spiritual Assessment  Palliative Care Outcomes  Patient/Family meeting held?  Yes  Who was at the meeting?  patient and daughter-in-law  Palliative Care Outcomes  Clarified goals of care, Counseled regarding hospice, Provided psychosocial or spiritual support, Improved non-pain symptom therapy     Time In: 1300 Time Out: 1430 Time Total: 53mn Greater than 50%  of this time was spent counseling and coordinating care related to the above assessment and plan.  Signed by:  MIhor Dow FNP-C Palliative Medicine Team  Phone: [phone]: [phone]  Please contact Palliative Medicine Team  phone at [phone] for questions and concerns.  For individual provider: See Shea Evans

## 2016-01-12 LAB — CULTURE, BLOOD (ROUTINE X 2)

## 2016-01-12 MED ORDER — BISACODYL 10 MG RE SUPP
10.0000 mg | Freq: Every day | RECTAL | Status: DC | PRN
  Administered 2016-01-12: 10 mg via RECTAL
  Filled 2016-01-12: qty 1

## 2016-01-12 MED ORDER — SENNA 8.6 MG PO TABS
1.0000 | ORAL_TABLET | Freq: Every day | ORAL | Status: DC
  Administered 2016-01-13 – 2016-01-14 (×2): 8.6 mg via ORAL

## 2016-01-12 MED ORDER — SENNA 8.6 MG PO TABS
1.0000 | ORAL_TABLET | Freq: Every day | ORAL | Status: DC | PRN
  Administered 2016-01-12: 13:00:00 8.6 mg via ORAL
  Filled 2016-01-12 (×4): qty 1

## 2016-01-12 NOTE — Progress Notes (Signed)
[facility] NAME: Russell Howard    MR#:  [id number]  DATE OF BIRTH:  1934/06/25  SUBJECTIVE:  CHIEF COMPLAINT:   Chief Complaint  Patient presents with  . Nausea   The patient no nausea, no vomiting or diarrhea. He has better cough and poor oral intake. Blood pressure is better. On oxygen [place] 2 L. REVIEW OF SYSTEMS:  Review of Systems  Constitutional: Positive for malaise/fatigue. Negative for chills and fever.  Respiratory: Positive for cough. Negative for hemoptysis and shortness of breath.   Cardiovascular: Negative for chest pain and leg swelling.  Gastrointestinal: Negative for abdominal pain, blood in stool, diarrhea, melena, nausea and vomiting.  Genitourinary: Negative for dysuria, frequency and hematuria.  Neurological: Positive for weakness.   limited review of system due to patient's medical condition.  DRUG ALLERGIES:   Allergies  Allergen Reactions  . Penicillin G Hives    Has patient had a PCN reaction causing immediate rash, facial/tongue/throat swelling, SOB or lightheadedness with hypotension: Yes Has patient had a PCN reaction causing severe rash involving mucus membranes or skin necrosis: No Has patient had a PCN reaction that required hospitalization No Has patient had a PCN reaction occurring within the last 10 years: No If all of the above answers are "NO", then may proceed with Cephalosporin use.  . Sulfa Antibiotics Rash   VITALS:  Blood pressure 110/66, pulse 84, temperature 97.9 F (36.6 C), temperature source Oral, resp. rate 18, height '5\' 10"'$  (1.778 m), weight 78.5 kg (173 lb), SpO2 99 %. PHYSICAL EXAMINATION:  Physical Exam  Constitutional: He is oriented to person, place, and time.  HENT:  Dry oral mucosa  Eyes: Conjunctivae and EOM are normal.  Neck: Neck supple. No JVD present. No tracheal deviation present.  Cardiovascular: Normal rate, regular rhythm and normal heart sounds.  Exam  reveals no gallop.   No murmur heard. Pulmonary/Chest: Effort normal. No respiratory distress. He has no wheezes. He has rales.  Almost no lung sounds and crackles on the right side. Rales on the left side.  Abdominal: Soft. Bowel sounds are normal. He exhibits no distension. There is no tenderness.  Musculoskeletal: Normal range of motion. He exhibits no edema or tenderness.  Neurological: He is oriented to person, place, and time. No cranial nerve deficit.  Skin: No rash noted. No erythema.   LABORATORY PANEL:   CBC  Recent Labs Lab 01/11/16 0429  WBC 9.4  HGB 12.5*  HCT 37.0*  PLT 137*   ------------------------------------------------------------------------------------------------------------------ Chemistries   Recent Labs Lab 01/09/16 0038  01/11/16 0429  NA 135  < > 134*  K 4.1  < > 3.9  CL 100*  < > 102  CO2 23  < > 26  GLUCOSE 166*  < > 91  BUN 38*  < > 19  CREATININE 1.01  < > 0.86  CALCIUM 9.2  < > 8.5*  AST 25  --   --   ALT 13*  --   --   ALKPHOS 79  --   --   BILITOT 1.3*  --   --   < > = values in this interval not displayed. RADIOLOGY:  No results found. ASSESSMENT AND PLAN:   This is a 81 year old male admitted for sepsis. 1. Sepsis with UTI and bacteremia with CNS, ? Contamination Continue vancomycin and Levaquin per Dr. Ola Spurr. Urine culture revealed less than 10,000 colony-forming units, insignificant growth, blood culture, CNS  Hypotension.improved  with normal saline bolus and monitor vital signs closely. Dehydration. Improved with with IV fluid support  Discontinued normal saline IV due to hypoxia and concern of history of CHF..  2. Hypoxia: Likely secondary to loculated effusions.   Supply supplemental oxygen as needed. Per Dr. Genevive Bi,  [facility] surgeon, no aggressive treatment after discussed in this patient's POA.  3. Chronic congestive heart failure: Diastolic; ventricular paced with AICD. Off Lasix and digoxin due to hypotension and  bradycardia. Resumed digoxin and given one dose lasix iv yesterday, oxygenation is stable.  4. Hypertension: disontinued metoprolol. Continue hydrocortisone.  5. Hypothyroidism: Continue Synthroid 6. Hyperlipidemia: Continue statin therapy 7. Recurrent urinary tract infection: Continue methenamine. 8. 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 BPH: Continue Flomax 10. History of urinary retention, postvoid bladder scan was about 400 cc, no in and out catheterization was performed, order and out catheterization as needed . 11. 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 The patient's POA, daughter-in-law wants patient to be discharged to home after the patient is stable. The patient has critical condition and the prognosis. All the records are reviewed and case discussed with RN, Care Management/Social Worker. Management plans discussed with the patient, his daughter-in-law (POA) and they are in agreement.  CODE STATUS: DO NOT RESUSCITATE  TOTAL TIME TAKING CARE OF THIS PATIENT: 35 minutes.   More than 50% of the time was spent in counseling/coordination of care: YES  POSSIBLE D/C IN 2-3 DAYS, DEPENDING ON CLINICAL CONDITION.   Theodoro Grist M.D on 01/12/2016 at 12:13 PM  Between 7am to 6pm - Pager - [phone]  After 6pm go to [email or web address] - Proofreader  [facility]  Office  [phone]  CC: Primary care physician; [name], MD  Note: This dictation was prepared with Dragon dictation along with smaller phrase technology. Any transcriptional errors that result from this process are unintentional.

## 2016-01-12 NOTE — Progress Notes (Signed)
Notified Dr Ether Griffins that pt voided 322m. Bladder scanned 384m post void 4094mPt denies bladder discomfort. No new orders at this time.

## 2016-01-13 ENCOUNTER — Encounter: Payer: Self-pay | Admitting: Radiology

## 2016-01-13 ENCOUNTER — Inpatient Hospital Stay: Payer: Medicare Other

## 2016-01-13 DIAGNOSIS — R7881 Bacteremia: Secondary | ICD-10-CM

## 2016-01-13 DIAGNOSIS — I959 Hypotension, unspecified: Secondary | ICD-10-CM

## 2016-01-13 DIAGNOSIS — K5900 Constipation, unspecified: Secondary | ICD-10-CM

## 2016-01-13 DIAGNOSIS — R339 Retention of urine, unspecified: Secondary | ICD-10-CM

## 2016-01-13 DIAGNOSIS — R1030 Lower abdominal pain, unspecified: Secondary | ICD-10-CM

## 2016-01-13 LAB — CBC
HCT: 35.9 % — ABNORMAL LOW (ref 40.0–52.0)
Hemoglobin: 12 g/dL — ABNORMAL LOW (ref 13.0–18.0)
MCH: 29.9 pg (ref 26.0–34.0)
MCHC: 33.6 g/dL (ref 32.0–36.0)
MCV: 89 fL (ref 80.0–100.0)
PLATELETS: 184 10*3/uL (ref 150–440)
RBC: 4.03 MIL/uL — ABNORMAL LOW (ref 4.40–5.90)
RDW: 16.2 % — AB (ref 11.5–14.5)
WBC: 7.7 10*3/uL (ref 3.8–10.6)

## 2016-01-13 LAB — SODIUM: Sodium: 136 mmol/L (ref 135–145)

## 2016-01-13 MED ORDER — IOPAMIDOL (ISOVUE-300) INJECTION 61%
100.0000 mL | Freq: Once | INTRAVENOUS | Status: AC | PRN
  Administered 2016-01-13: 100 mL via INTRAVENOUS

## 2016-01-13 MED ORDER — DOCUSATE SODIUM 100 MG PO CAPS: 100.0000 mg | ORAL_CAPSULE | Freq: Two times a day (BID) | ORAL | 0 refills | Status: DC | PRN

## 2016-01-13 MED ORDER — LEVOFLOXACIN 750 MG PO TABS
750.0000 mg | ORAL_TABLET | ORAL | Status: DC
  Administered 2016-01-13: 750 mg via ORAL
  Filled 2016-01-13: qty 1

## 2016-01-13 MED ORDER — DOXYCYCLINE HYCLATE 100 MG PO TABS
100.0000 mg | ORAL_TABLET | Freq: Two times a day (BID) | ORAL | Status: DC
  Administered 2016-01-13 – 2016-01-14 (×2): 100 mg via ORAL
  Filled 2016-01-13 (×2): qty 1

## 2016-01-13 MED ORDER — LEVOFLOXACIN 750 MG PO TABS: 750.0000 mg | ORAL_TABLET | ORAL | 0 refills | Status: DC

## 2016-01-13 MED ORDER — CODEINE SULFATE 30 MG PO TABS
15.0000 mg | ORAL_TABLET | ORAL | Status: DC | PRN
  Administered 2016-01-13 – 2016-01-14 (×2): 15 mg via ORAL
  Filled 2016-01-13 (×2): qty 1

## 2016-01-13 MED ORDER — SENNA 8.6 MG PO TABS: 1.0000 | ORAL_TABLET | Freq: Every day | ORAL | 0 refills | Status: DC

## 2016-01-13 MED ORDER — POLYETHYLENE GLYCOL 3350 17 G PO PACK
17.0000 g | PACK | Freq: Every day | ORAL | Status: DC
  Administered 2016-01-13 – 2016-01-14 (×2): 17 g via ORAL
  Filled 2016-01-13 (×2): qty 1

## 2016-01-13 MED ORDER — IOPAMIDOL (ISOVUE-300) INJECTION 61%
15.0000 mL | INTRAVENOUS | Status: AC
  Administered 2016-01-13 (×2): 15 mL via ORAL

## 2016-01-13 MED ORDER — GUAIFENESIN ER 600 MG PO TB12
600.0000 mg | ORAL_TABLET | Freq: Two times a day (BID) | ORAL | 4 refills | Status: DC
Start: 2016-01-13 — End: 2016-01-23

## 2016-01-13 MED ORDER — DOXYCYCLINE HYCLATE 100 MG PO CAPS: 100.0000 mg | ORAL_CAPSULE | Freq: Two times a day (BID) | ORAL | 0 refills | Status: DC

## 2016-01-13 NOTE — Care Management Note (Signed)
Case Management Note  Patient Details  Name: Russell Howard MRN: [id number] Date of Birth: [birth year]  Subjective/Objective:       Russell Howard is off the unit. Still open to [facility]? . Has Palliative Consult. Has home health orders for PT, RN, Aide, but no discharge orders. Discussed with Malka RN and asked her to please call the weekend CM if Russell Woolum is discharged home Sunday.              Action/Plan:   Expected Discharge Date:  01/12/16               Expected Discharge Plan:     In-House Referral:     Discharge planning Services     Post Acute Care Choice:    Choice offered to:     DME Arranged:    DME Agency:     HH Arranged:    HH Agency:     Status of Service:     If discussed at [employer], dates discussed:    Additional Comments:  Jalyne Brodzinski A, RN 01/13/2016, 4:05 PM

## 2016-01-13 NOTE — Plan of Care (Signed)
Problem: Pain Managment: Goal: General experience of comfort will improve Outcome: Progressing Pt reported in am tenderness in R lower abd. In&out cath done once post void-630m removed. Soap sud enema done.Pt had a large BM. Pt still with tenderness in lower abd post interventions.MD notified, CT of the abd ordered.  Problem: Nutrition: Goal: Adequate nutrition will be maintained Outcome: Progressing Appetite improving. Pt tolerates meals.  Problem: Bowel/Gastric: Goal: Will not experience complications related to bowel motility Outcome: Progressing As above.

## 2016-01-13 NOTE — Progress Notes (Signed)
[facility] [place] NAME: Russell Howard    MR#:  [id number]  DATE OF BIRTH:  [birth year]  SUBJECTIVE:  CHIEF COMPLAINT:   Chief Complaint  Patient presents with  . Nausea   The patient no nausea, no vomiting or diarrhea. Oral intake has improved . Blood pressure is relatively low in the 90s to 110s. On oxygen [place] 1 L, weaning noted in the room air as tolerated. REVIEW OF SYSTEMS:  Review of Systems  Constitutional: Positive for malaise/fatigue. Negative for chills and fever.  Respiratory: Positive for cough. Negative for hemoptysis and shortness of breath.   Cardiovascular: Negative for chest pain and leg swelling.  Gastrointestinal: Negative for abdominal pain, blood in stool, diarrhea, melena, nausea and vomiting.  Genitourinary: Negative for dysuria, frequency and hematuria.  Neurological: Positive for weakness.   limited review of system due to patient's medical condition.  DRUG ALLERGIES:   Allergies  Allergen Reactions  . Penicillin G Hives    Has patient had a PCN reaction causing immediate rash, facial/tongue/throat swelling, SOB or lightheadedness with hypotension: Yes Has patient had a PCN reaction causing severe rash involving mucus membranes or skin necrosis: No Has patient had a PCN reaction that required hospitalization No Has patient had a PCN reaction occurring within the last 10 years: No If all of the above answers are "NO", then may proceed with Cephalosporin use.  . Sulfa Antibiotics Rash   VITALS:  Blood pressure (!) 91/58, pulse 94, temperature 97.7 F (36.5 C), temperature source Oral, resp. rate 18, height '5\' 10"'$  (1.778 m), weight 78.7 kg (173 lb 8 oz), SpO2 100 %. PHYSICAL EXAMINATION:  Physical Exam  Constitutional: He is oriented to person, place, and time.  HENT:  Dry oral mucosa  Eyes: Conjunctivae and EOM are normal.  Neck: Neck supple. No JVD present. No tracheal deviation present.  Cardiovascular:  Normal rate, regular rhythm and normal heart sounds.  Exam reveals no gallop.   No murmur heard. Pulmonary/Chest: Effort normal. No respiratory distress. He has no wheezes. He has no rales.  Almost no lung sounds and crackles on the right side. Rales on the left side.  Abdominal: Soft. Bowel sounds are normal. He exhibits no distension. There is tenderness in the suprapubic area.  Musculoskeletal: Normal range of motion. He exhibits no edema or tenderness.  Neurological: He is oriented to person, place, and time. No cranial nerve deficit.  Skin: No rash noted. No erythema.   LABORATORY PANEL:   CBC  Recent Labs Lab 01/13/16 0441  WBC 7.7  HGB 12.0*  HCT 35.9*  PLT 184   ------------------------------------------------------------------------------------------------------------------ Chemistries   Recent Labs Lab 01/09/16 0038  01/11/16 0429 01/13/16 0441  NA 135  < > 134* 136  K 4.1  < > 3.9  --   CL 100*  < > 102  --   CO2 23  < > 26  --   GLUCOSE 166*  < > 91  --   BUN 38*  < > 19  --   CREATININE 1.01  < > 0.86  --   CALCIUM 9.2  < > 8.5*  --   AST 25  --   --   --   ALT 13*  --   --   --   ALKPHOS 79  --   --   --   BILITOT 1.3*  --   --   --   < > = values in  this interval not displayed. RADIOLOGY:  No results found. ASSESSMENT AND PLAN:   This is a 81 year old male admitted for sepsis. 1. Sepsis with UTI and bacteremia with CNS, ? Contamination Continue doxycycline and Levaquin total 10 days, per Dr. Ola Spurr. Urine culture revealed less than 10,000 colony-forming units, insignificant growth, blood culture, CNS  #2 Hypotension.improved with normal saline bolus and monitor vital signs closely. Remains relatively stable  #3 Dehydration. Improved with with IV fluid support  Discontinued normal saline IV due to hypoxia and concern of history of CHF..  #4. Hypoxia: Likely secondary to loculated effusions.   Supply supplemental oxygen as needed. Per Dr. Genevive Bi,   [place] surgeon, no aggressive treatment after discussed in this patient's POA. Repeating O2 sats on room air on exertion, may need oxygen therapy at home  #5. Chronic congestive heart failure: Diastolic; ventricular paced with AICD. Off Lasix and digoxin due to hypotension and bradycardia. Resumed digoxin and given one dose lasix daily before yesterday, oxygenation is stable, improved, weaning off oxygen.  #6. Hypertension: disontinued metoprolol due to hypotension. Continue hydrocortisone.  #7. Hypothyroidism: Continue Synthroid  #8. Hyperlipidemia: Continue statin therapy  #9. Recurrent urinary tract infection: Continue methenamine.  #10. 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 BPH: Continue Flomax. The patient has been doing intermittent catheterization's twice a day at home  #12. History of urinary retention, postvoid bladder scan was about 480 today , patient had catheterization, however, abdominal discomfort was not relieved.  . #13. 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  #14 lower abdominal pain, etiology is unclear, patient did have bladder catheterization earlier today, his constipation was relieved of his enema, however, abdominal pain continues to bother him, getting CT scan of abdomen and pelvis to rule out pelvic abnormalities, continue broad-spectrum antibiotic therapy for now. Oral intake has improved.     The patient's POA, daughter-in-law wants patient to be discharged to home after the patient is stable. The patient has critical condition and the prognosis. All the records are reviewed and case discussed with RN, Care Management/Social Worker. Management plans discussed with the patient, his daughter-in-law (POA) and they are in agreement.  CODE STATUS: DO NOT RESUSCITATE  TOTAL TIME TAKING CARE OF THIS PATIENT: 40 minutes.   More than 50% of the time was spent in counseling/coordination of care: YES  POSSIBLE D/C IN 2-3  DAYS, DEPENDING ON CLINICAL CONDITION.   Theodoro Grist M.D on 01/13/2016 at 1:57 PM  Between 7am to 6pm - Pager - [phone]  After 6pm go to [email or web address] - Proofreader  [facility]  Office  [phone]  CC: Primary care physician; [name], MD  Note: This dictation was prepared with Dragon dictation along with smaller phrase technology. Any transcriptional errors that result from this process are unintentional.

## 2016-01-13 NOTE — Care Management Important Message (Signed)
Important Message  Patient Details  Name: Russell Howard MRN: [id number] Date of Birth: [birth year]   Medicare Important Message Given:  Yes    Sara Selvidge A, RN 01/13/2016, 9:59 AM

## 2016-01-13 NOTE — Progress Notes (Signed)
Notes, labs, imaging reviewed. Full consult to follow. H/o retention on CIC. CT done for abd pain, specifically SP tenderness. CT shows distended bladder, mild right hydro present. The hydro does not need intervention apart from ensuring he gets CIC when needed. I placed an order for CIC as I didn't see one.

## 2016-01-14 ENCOUNTER — Telehealth: Payer: Self-pay

## 2016-01-14 DIAGNOSIS — N1330 Unspecified hydronephrosis: Secondary | ICD-10-CM

## 2016-01-14 DIAGNOSIS — R338 Other retention of urine: Secondary | ICD-10-CM

## 2016-01-14 DIAGNOSIS — N131 Hydronephrosis with ureteral stricture, not elsewhere classified: Secondary | ICD-10-CM

## 2016-01-14 LAB — CULTURE, BLOOD (ROUTINE X 2): Culture: NO GROWTH

## 2016-01-14 LAB — CREATININE, SERUM: Creatinine, Ser: 0.75 mg/dL (ref 0.61–1.24)

## 2016-01-14 MED ORDER — HYDROCOD POLST-CPM POLST ER 10-8 MG/5ML PO SUER: 5.0000 mL | ORAL | 0 refills | Status: DC | PRN

## 2016-01-14 MED ORDER — POLYETHYLENE GLYCOL 3350 17 G PO PACK: 17.0000 g | PACK | Freq: Every day | ORAL | 0 refills | Status: DC

## 2016-01-14 MED ORDER — SENNA 8.6 MG PO TABS: 1.0000 | ORAL_TABLET | Freq: Every day | ORAL | 0 refills | Status: DC | PRN

## 2016-01-14 NOTE — Consult Note (Addendum)
Consult: Right hydronephrosis Requested by: Dr. Ether Griffins  History of Present Illness: 81 year old white male admitted with shortness of breath, hypoxia and nausea. He had a septic-like picture. He has loculated pleural effusions. On initial presentation his UA showed too numerous to count white cells with no bacteria. He has a history of urethral stricture, urinary retention and does CIC. He stabilized with medical treatment. Plan was for discharge yesterday but he developed some right lower quadrant pain. CT scan was done which showed mild right hydroureteronephrosis to a distended bladder. The kidneys enhance symmetrically and there is prompt excretion although slightly less on the right. The ureter was widely patent to the bladder.  They've been having trouble with CIC because they are using a soft red rubber catheter and the patient uses a stiff plastic one.   Pt was seen in our office Aug 2017 and due for f/u Feb 2017.   Past Medical History:  Diagnosis Date  . Acquired hypothyroidism 11/02/2014  . BPH (benign prostatic hyperplasia)   . Cardiac defibrillator in place 11/02/2014  . Cardiomyopathy ([place])   . CHF (congestive heart failure) ([place])   . Chronic diastolic heart failure ([place]) 11/02/2014  . Gastro-esophageal reflux disease without esophagitis 11/02/2014  . H/O malignant neoplasm 11/09/2014  . Heart valve disease 11/02/2014  . History of atrial fibrillation 11/02/2014  . Lung cancer ([place])   . Neuropathy ([place]) 11/02/2014  . Orthostasis 11/02/2014  . Pure hypercholesterolemia 11/02/2014  . Valvular heart disease    Past Surgical History:  Procedure Laterality Date  . APPENDECTOMY    . DUAL ICD IMPLANT  2007/2009   implantable cardiac defibrillator  . PORT-A-CATH REMOVAL      Home Medications:  Prescriptions Prior to Admission  Medication Sig Dispense Refill Last Dose  . acidophilus (RISAQUAD) CAPS capsule Take 1 capsule by mouth daily.   01/08/2016 at Unknown time  . apixaban  (ELIQUIS) 5 MG TABS tablet Take 1 tablet (5 mg total) by mouth 2 (two) times daily. 60 tablet 0 01/08/2016 at Unknown time  . Ascorbic Acid (VITAMIN C) 1000 MG tablet Take 500 mg by mouth 3 (three) times daily. Reported on 06/14/2015   01/08/2016 at Unknown time  . aspirin EC 81 MG tablet Take 81 mg by mouth daily.    01/08/2016 at Unknown time  . Cholecalciferol 5000 units TABS Take 5,000 Units by mouth every morning.   01/08/2016 at Unknown time  . Coenzyme Q10 (TH CO Q-10) 100 MG capsule Take 100 mg by mouth daily.    01/08/2016 at Unknown time  . DIGOX 250 MCG tablet Take 250 mcg by mouth daily.   01/08/2016 at Unknown time  . furosemide (LASIX) 20 MG tablet Take 20 mg by mouth every other day as needed (as needed for water retention).    Taking  . gabapentin (NEURONTIN) 300 MG capsule Take 300 mg by mouth 3 (three) times daily.    01/08/2016 at Unknown time  . hydrocortisone (CORTEF) 10 MG tablet Take 10 mg by mouth 2 (two) times daily.    01/08/2016 at Unknown time  . levothyroxine (SYNTHROID, LEVOTHROID) 50 MCG tablet Take 50 mcg by mouth daily before breakfast.   01/08/2016 at Unknown time  . MAGNESIUM-OXIDE 400 (241.3 Mg) MG tablet Take 400 mg by mouth daily.   01/08/2016 at Unknown time  . methenamine (HIPREX) 1 g tablet TAKE 1 TABLET(1 GRAM) BY MOUTH TWICE DAILY WITH A MEAL 60 tablet 3 01/08/2016 at Unknown time  . midodrine (PROAMATINE)  5 MG tablet Take 5 mg by mouth 3 (three) times daily with meals.   01/08/2016 at Unknown time  . montelukast (SINGULAIR) 10 MG tablet Take 10 mg by mouth every evening.   01/08/2016 at Unknown time  . Multiple Vitamin (MULTIVITAMIN WITH MINERALS) TABS tablet Take 1 tablet by mouth daily.   01/08/2016 at Unknown time  . pantoprazole (PROTONIX) 40 MG tablet Take 40 mg by mouth daily before breakfast.    01/08/2016 at Unknown time  . potassium chloride (MICRO-K) 10 MEQ CR capsule Take 10 mEq by mouth daily.    01/08/2016 at Unknown time  . pyridostigmine (MESTINON) 60 MG  tablet Take 60 mg by mouth 2 (two) times daily.    01/08/2016 at Unknown time  . ranitidine (ZANTAC) 300 MG tablet Take 300 mg by mouth at bedtime.   01/08/2016 at Unknown time  . SAW PALMETTO-PUMPKIN SEED OIL PO Take 1 capsule by mouth daily.    01/08/2016 at Unknown time  . simvastatin (ZOCOR) 20 MG tablet Take 20 mg by mouth daily at 6 PM.    01/08/2016 at Unknown time  . vitamin B-12 (CYANOCOBALAMIN) 500 MCG tablet Take 500 mcg by mouth daily.    01/08/2016 at Unknown time  . vitamin E 400 UNIT capsule Take 400 Units by mouth daily.    01/08/2016 at Unknown time  . levofloxacin (LEVAQUIN) 750 MG tablet Take 1 tablet (750 mg total) by mouth daily. (Patient not taking: Reported on 01/09/2016) 7 tablet 0 Completed Course at Unknown time  . metoprolol succinate (TOPROL XL) 25 MG 24 hr tablet Take 1 tablet (25 mg total) by mouth daily. (Patient not taking: Reported on 01/09/2016) 30 tablet 1 Not Taking at Unknown time   Allergies:  Allergies  Allergen Reactions  . Penicillin G Hives    Has patient had a PCN reaction causing immediate rash, facial/tongue/throat swelling, SOB or lightheadedness with hypotension: Yes Has patient had a PCN reaction causing severe rash involving mucus membranes or skin necrosis: No Has patient had a PCN reaction that required hospitalization No Has patient had a PCN reaction occurring within the last 10 years: No If all of the above answers are "NO", then may proceed with Cephalosporin use.  . Sulfa Antibiotics Rash    Family History  Problem Relation Age of Onset  . Hypertension    . Heart disease     Social History:  reports that he quit smoking about 34 years ago. His smoking use included Cigarettes. He has never used smokeless tobacco. He reports that he does not drink alcohol or use drugs.  ROS: A complete review of systems was performed.  All systems are negative except for pertinent findings as noted. Review of Systems  All other systems reviewed and are  negative.    Physical Exam:  Vital signs in last 24 hours: Temp:  [97.6 F (36.4 C)-97.7 F (36.5 C)] 97.6 F (36.4 C) (12/11 0357) Pulse Rate:  [83-98] 83 (12/11 0357) Resp:  [18-20] 20 (12/11 0357) BP: (91-111)/(47-61) 98/47 (12/11 0357) SpO2:  [94 %-100 %] 94 % (12/11 0357) Weight:  [79.1 kg (174 lb 6.4 oz)] 79.1 kg (174 lb 6.4 oz) (12/11 0459) General:  Alert and oriented, No acute distress HEENT: Normocephalic, atraumatic Neck: No JVD or lymphadenopathy Cardiovascular: Regular rate and rhythm Lungs: Regular rate and effort Abdomen: Soft, nontender, nondistended, no abdominal masses, no hernia  Back: No CVA tenderness Extremities: No edema Neurologic: Grossly intact  Laboratory Data:  Results for  orders placed or performed during the hospital encounter of 01/09/16 (from the past 24 hour(s))  Creatinine, serum     Status: None   Collection Time: 01/14/16  4:35 AM  Result Value Ref Range   Creatinine, Ser 0.75 0.61 - 1.24 mg/dL   GFR calc non Af Amer >60 >60 mL/min   GFR calc Af Amer >60 >60 mL/min   Recent Results (from the past 240 hour(s))  Urine culture     Status: Abnormal   Collection Time: 01/09/16  1:41 AM  Result Value Ref Range Status   Specimen Description URINE, RANDOM  Final   Special Requests NONE  Final   Culture (A)  Final    <10,000 COLONIES/mL INSIGNIFICANT GROWTH Performed at [facility]    Report Status 01/10/2016 FINAL  Final  Blood culture (routine x 2)     Status: None   Collection Time: 01/09/16  4:32 AM  Result Value Ref Range Status   Specimen Description BLOOD  R AC  Final   Special Requests   Final    BOTTLES DRAWN AEROBIC AND ANAEROBIC  AER 14 ML ANA 12 ML    Culture NO GROWTH 5 DAYS  Final   Report Status 01/14/2016 FINAL  Final  Blood culture (routine x 2)     Status: Abnormal   Collection Time: 01/09/16  4:32 AM  Result Value Ref Range Status   Specimen Description BLOOD BLOOD LEFT WRIST  Final   Special Requests    Final    BOTTLES DRAWN AEROBIC AND ANAEROBIC  AER 11 ML ANA 10 ML   Culture  Setup Time   Final    Organism ID to follow [place] AND ANAEROBIC BOTTLES CRITICAL RESULT CALLED TO, READ BACK BY AND VERIFIED WITH: [place] AT [id number] 01/10/16 SDR    Culture (A)  Final    STAPHYLOCOCCUS SPECIES (COAGULASE NEGATIVE) THE SIGNIFICANCE OF ISOLATING THIS ORGANISM FROM A SINGLE SET OF BLOOD CULTURES WHEN MULTIPLE SETS ARE DRAWN IS UNCERTAIN. PLEASE NOTIFY THE MICROBIOLOGY DEPARTMENT WITHIN ONE WEEK IF SPECIATION AND SENSITIVITIES ARE REQUIRED. Performed at [facility]    Report Status 01/12/2016 FINAL  Final  Blood Culture ID Panel (Reflexed)     Status: Abnormal   Collection Time: 01/09/16  4:32 AM  Result Value Ref Range Status   Enterococcus species NOT DETECTED NOT DETECTED Final   Listeria monocytogenes NOT DETECTED NOT DETECTED Final   Staphylococcus species DETECTED (A) NOT DETECTED Final    Comment: CRITICAL RESULT CALLED TO, READ BACK BY AND VERIFIED WITH:  NATE COOKSON AT 0547 01/10/16 SDR    Staphylococcus aureus NOT DETECTED NOT DETECTED Final   Methicillin resistance DETECTED (A) NOT DETECTED Final    Comment: CRITICAL RESULT CALLED TO, READ BACK BY AND VERIFIED WITH:  NATE COOKSON AT 0547 01/10/16 SDR    Streptococcus species NOT DETECTED NOT DETECTED Final   Streptococcus agalactiae NOT DETECTED NOT DETECTED Final   Streptococcus pneumoniae NOT DETECTED NOT DETECTED Final   Streptococcus pyogenes NOT DETECTED NOT DETECTED Final   Acinetobacter baumannii NOT DETECTED NOT DETECTED Final   Enterobacteriaceae species NOT DETECTED NOT DETECTED Final   Enterobacter cloacae complex NOT DETECTED NOT DETECTED Final   Escherichia coli NOT DETECTED NOT DETECTED Final   Klebsiella oxytoca NOT DETECTED NOT DETECTED Final   Klebsiella pneumoniae NOT DETECTED NOT DETECTED Final   Proteus species NOT DETECTED NOT DETECTED Final   Serratia marcescens NOT  DETECTED NOT  DETECTED Final   Haemophilus influenzae NOT DETECTED NOT DETECTED Final   Neisseria meningitidis NOT DETECTED NOT DETECTED Final   Pseudomonas aeruginosa NOT DETECTED NOT DETECTED Final   Candida albicans NOT DETECTED NOT DETECTED Final   Candida glabrata NOT DETECTED NOT DETECTED Final   Candida krusei NOT DETECTED NOT DETECTED Final   Candida parapsilosis NOT DETECTED NOT DETECTED Final   Candida tropicalis NOT DETECTED NOT DETECTED Final  MRSA PCR Screening     Status: None   Collection Time: 01/09/16  8:57 AM  Result Value Ref Range Status   MRSA by PCR NEGATIVE NEGATIVE Final    Comment:        The GeneXpert MRSA Assay (FDA approved for NASAL specimens only), is one component of a comprehensive MRSA colonization surveillance program. It is not intended to diagnose MRSA infection nor to guide or monitor treatment for MRSA infections.    Creatinine:  Recent Labs  01/09/16 0038 01/10/16 0458 01/11/16 0429 01/14/16 0435  CREATININE 1.01 1.09 0.86 0.75    Impression/Assessment:  Right hydronephrosis-this is likely from reflux uropathy. It's of no clinical significance. Kidney function is normal.  Urinary retention, urethral stricture-continue CIC. He may need a stiff plastic catheter. Discussed with the nurse.  Plan:  No new GU recommendations. He is stable for discharge from a GU pt of view. I'll get him back in the office for a recheck in next week or two.    Kinney Sackmann 01/14/2016, 9:02 AM

## 2016-01-14 NOTE — Progress Notes (Signed)
Pt is being discharged today, IV x1 removed. Discharge instructions given to patient and his daughter, they both verified understanding. 1 paper prescription given to patient. He is being discharged on oxygen, tank was delivered and family has called the company.

## 2016-01-14 NOTE — Progress Notes (Signed)
New referral for the [facility] home [facility] (former [facility] patient)  on 81 yo gentleman who presented to the ED with complaints of nausea and shortness of breath.  He was admitted to the hospital on 12.6.2017 with dx:  Right pleural effusion and sepsis from UTI.  He has past medical history of lung cancer, heart disease win internal defibrillator, cardiomyopathy afib, neuropathy, hypercholesterolemia, and hypothyrodism. This hospital course he has been treated with IV doxycycline and levaquin for 10 days.  Patient is requiring new oxygen on discharge that will be supplied by [facility]. Ordered disciplines:  Skilled nursing, Physical Therapy and HHA.  Patient with plans to discharge home today.  Patient's primary caregiver and POA is his daughter in law Russell Howard [phone].  Current DME in the home is walker and wheelchair.  Information faxed to referral intake. Thank you for allowing participation in this patient's care.  Dimas Aguas,  MA, BSN, RN Clinical Nurse Liaison [facility] [phone]

## 2016-01-14 NOTE — Progress Notes (Signed)
SATURATION QUALIFICATIONS: (This note is used to comply with regulatory documentation for home oxygen)  Patient Saturations on Room Air at Rest = 86%  Patient Saturations on Room Air while Ambulating = 86%  Patient Saturations on 2 Liters of oxygen while Ambulating = 96%  Please briefly explain why patient needs home oxygen:

## 2016-01-14 NOTE — Care Management (Addendum)
Spoke with Mr. Habeeb and daughter (?) at the bedside. Would like to be followed by [facility] again in the home. Also, will need home oxygen.  Would like [facility] for home oxygen.  Larina Earthly at the bedside. States that she doesn't feel like Russell Howard needs to go home today. Dr. Ether Griffins paged to come and talk about discharge plans. Shelbie Ammons RN MSN CCM Care Management

## 2016-01-14 NOTE — Discharge Summary (Addendum)
Russell Howard    MR#:  [id number]  DATE OF BIRTH:  Jun 25, 1934  DATE OF ADMISSION:  01/09/2016 ADMITTING PHYSICIAN: Harrie Foreman, MD  DATE OF DISCHARGE: No discharge date for patient encounter.  PRIMARY CARE PHYSICIAN: SPARKS,JEFFREY D, MD     ADMISSION DIAGNOSIS:  Nausea [R11.0] Weakness [R53.1] Hypoxia [R09.02] Loculated pleural effusion [J90]  DISCHARGE DIAGNOSIS:  Principal Problem:   Sepsis ([place]) Active Problems:   Lower urinary tract infectious disease   Hypotension   Hypoxia   Cough   Constipation   Urinary retention   Lower abdominal pain   Bacteremia   Palliative care encounter   Goals of care, counseling/discussion   Encounter for hospice care discussion   Hydronephrosis, right   SECONDARY DIAGNOSIS:   Past Medical History:  Diagnosis Date  . Acquired hypothyroidism 11/02/2014  . BPH (benign prostatic hyperplasia)   . Cardiac defibrillator in place 11/02/2014  . Cardiomyopathy ([place])   . CHF (congestive heart failure) ([place])   . Chronic diastolic heart failure ([place]) 11/02/2014  . Gastro-esophageal reflux disease without esophagitis 11/02/2014  . H/O malignant neoplasm 11/09/2014  . Heart valve disease 11/02/2014  . History of atrial fibrillation 11/02/2014  . Lung cancer ([place])   . Neuropathy ([place]) 11/02/2014  . Orthostasis 11/02/2014  . Pure hypercholesterolemia 11/02/2014  . Valvular heart disease     .pro HOSPITAL COURSE:   The patient is a 81 year old Caucasian male with past medical history significant for history of lung cancer, paracentesis, who presents to the hospital with nausea, shortness of breath, vomiting. On arrival to emergency room he was found to be hypoxic with O2 sats of 85% on room air. Chest x-ray revealed loculated pleural effusion on the right. The patient was tachycardic, not hypotensive, his labs revealed leukocytosis with white blood cell count of 12.9,  too numerous to count white blood cells on urine analysis, he was admitted to the hospital, initiated on broad-spectrum antibiotic therapy, IV fluids due to concerns of sepsis. His blood culture was positive for Staphylococcus, coagulase-negative, 1 out of 2 cultures. Urine culture showed less than 10,000 colony-forming units, no further identification was performed, patient was seen by infectious disease specialist, Dr. Ola Spurr, who recommended to continue antibiotic therapy with levofloxacin and vancomycin while in the hospital, change to levofloxacin and doxycycline when patient is ready to be discharged home TO COMPLETE 10 DAY COURSE. Patient's condition with conservative therapy improved. He was seen by physical therapist and recommended home health services. Because of abdominal discomfort, he underwent CT scan of his abdomen, revealing right hydronephrosis and hydroureter, which was felt by urologist to be due to reflux from his distended bladder, more frequent bladder catheterizations were recommended up to 3-4 times a day by urologist. Patient was felt to be stable to be discharged home with [facility] services. He qualified for oxygen therapy for home, which will be prescribed for him 24/ 7. Discussion by problem: . #1 Sepsis with UTI and bacteremia with CNS, ? Contamination Continue doxycycline and Levaquin for total 10 days, per Dr. Ola Spurr. Urine culture revealed less than 10,000 colony-forming units, insignificant growth, blood culture was - CNS 1 out of 2  #2 Hypotension.improved with normal saline bolus and monitor vital signs closely. Remains relatively stable, continue midodrine, hydrocortisone at home  #3 Dehydration. Improved with with IV fluid support  Discontinued normal saline IV due to hypoxia and concern of  CHF..  #4. Hypoxia:  secondary to loculated right pleural effusion.   Supply supplemental oxygen as needed. Per Dr. Genevive Bi,  [place] surgeon, no aggressive treatment after  discussed in this patient's POA. The patient qualified for oxygen therapy at home, prescribed upon discharge  #5. Chronic congestive heart failure: Diastolic; ventricular paced with AICD. Resumed digoxin and  lasix as needed ,  oxygenation is stable, now on chronic oxygen therapy at 2 L, O2 sats were 95-97%  #6. Hypothyroidism: Continue Synthroid, follow TSH as outpatient  #7. Hyperlipidemia: Continue statin therapy  #8. Recurrent urinary tract infection: Continue methenamine.  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 BPH: Continue Flomax. The patient was advised to continue diabetic catheterizations 3-4 times a day  #11. History of urinary retention, patient was seen by urologist, who felt that patient's right-sided hydronephrosis and hydroureter with due to urinary reflux, he recommended to continue more frequent bladder catheterizations, patient is to follow up with urology as outpatient . #12. Generalized weakness, PT evaluation suggests HHPT, patient was agreeable, will be followed by [facility].  #13 lower abdominal pain, etiology was likely bladder distention, relieved with bladder catheterization. CT of abdomen and pelvis revealed right hydronephrosis and hydroureter but no obstruction, likely reflux mediated, per urology DISCHARGE CONDITIONS:   Stable  CONSULTS OBTAINED:  Treatment Team:  Leonel Ramsay, MD Festus Aloe, MD  DRUG ALLERGIES:   Allergies  Allergen Reactions  . Penicillin G Hives    Has patient had a PCN reaction causing immediate rash, facial/tongue/throat swelling, SOB or lightheadedness with hypotension: Yes Has patient had a PCN reaction causing severe rash involving mucus membranes or skin necrosis: No Has patient had a PCN reaction that required hospitalization No Has patient had a PCN reaction occurring within the last 10 years: No If all of the above answers are "NO", then may proceed with  Cephalosporin use.  . Sulfa Antibiotics Rash    DISCHARGE MEDICATIONS:   Current Discharge Medication List    START taking these medications   Details  chlorpheniramine-HYDROcodone (TUSSIONEX) 10-8 MG/5ML SUER Take 5 mLs by mouth every 4 (four) hours as needed for cough. Qty: 140 mL, Refills: 0    docusate sodium (COLACE) 100 MG capsule Take 1 capsule (100 mg total) by mouth 2 (two) times daily as needed for mild constipation. Qty: 10 capsule, Refills: 0    doxycycline (VIBRAMYCIN) 100 MG capsule Take 1 capsule (100 mg total) by mouth 2 (two) times daily. Qty: 12 capsule, Refills: 0    guaiFENesin (MUCINEX) 600 MG 12 hr tablet Take 1 tablet (600 mg total) by mouth 2 (two) times daily. Qty: 20 tablet, Refills: 4    polyethylene glycol (MIRALAX / GLYCOLAX) packet Take 17 g by mouth daily. Qty: 14 each, Refills: 0    !! senna (SENOKOT) 8.6 MG TABS tablet Take 1 tablet (8.6 mg total) by mouth daily. Qty: 120 each, Refills: 0    !! senna (SENOKOT) 8.6 MG TABS tablet Take 1 tablet (8.6 mg total) by mouth daily as needed for mild constipation or moderate constipation. Qty: 120 each, Refills: 0     !! - Potential duplicate medications found. Please discuss with provider.    CONTINUE these medications which have CHANGED   Details  levofloxacin (LEVAQUIN) 750 MG tablet Take 1 tablet (750 mg total) by mouth daily. Qty: 6 tablet, Refills: 0      CONTINUE these medications which have NOT CHANGED   Details  acidophilus (RISAQUAD) CAPS capsule Take 1 capsule by mouth  daily.    apixaban (ELIQUIS) 5 MG TABS tablet Take 1 tablet (5 mg total) by mouth 2 (two) times daily. Qty: 60 tablet, Refills: 0    Ascorbic Acid (VITAMIN C) 1000 MG tablet Take 500 mg by mouth 3 (three) times daily. Reported on 06/14/2015    aspirin EC 81 MG tablet Take 81 mg by mouth daily.     Cholecalciferol 5000 units TABS Take 5,000 Units by mouth every morning.    Coenzyme Q10 (TH CO Q-10) 100 MG capsule Take  100 mg by mouth daily.     DIGOX 250 MCG tablet Take 250 mcg by mouth daily.    furosemide (LASIX) 20 MG tablet Take 20 mg by mouth every other day as needed (as needed for water retention).    Associated Diagnoses: Cellulitis of right upper extremity    gabapentin (NEURONTIN) 300 MG capsule Take 300 mg by mouth 3 (three) times daily.     hydrocortisone (CORTEF) 10 MG tablet Take 10 mg by mouth 2 (two) times daily.    Associated Diagnoses: Cellulitis of right upper extremity    levothyroxine (SYNTHROID, LEVOTHROID) 50 MCG tablet Take 50 mcg by mouth daily before breakfast.    MAGNESIUM-OXIDE 400 (241.3 Mg) MG tablet Take 400 mg by mouth daily.    methenamine (HIPREX) 1 g tablet TAKE 1 TABLET(1 GRAM) BY MOUTH TWICE DAILY WITH A MEAL Qty: 60 tablet, Refills: 3   Associated Diagnoses: Recurrent UTI    midodrine (PROAMATINE) 5 MG tablet Take 5 mg by mouth 3 (three) times daily with meals.    montelukast (SINGULAIR) 10 MG tablet Take 10 mg by mouth every evening.    Multiple Vitamin (MULTIVITAMIN WITH MINERALS) TABS tablet Take 1 tablet by mouth daily.    pantoprazole (PROTONIX) 40 MG tablet Take 40 mg by mouth daily before breakfast.     potassium chloride (MICRO-K) 10 MEQ CR capsule Take 10 mEq by mouth daily.     pyridostigmine (MESTINON) 60 MG tablet Take 60 mg by mouth 2 (two) times daily.    Associated Diagnoses: Cellulitis of right upper extremity    ranitidine (ZANTAC) 300 MG tablet Take 300 mg by mouth at bedtime.    SAW PALMETTO-PUMPKIN SEED OIL PO Take 1 capsule by mouth daily.     simvastatin (ZOCOR) 20 MG tablet Take 20 mg by mouth daily at 6 PM.     vitamin B-12 (CYANOCOBALAMIN) 500 MCG tablet Take 500 mcg by mouth daily.     vitamin E 400 UNIT capsule Take 400 Units by mouth daily.       STOP taking these medications     metoprolol succinate (TOPROL XL) 25 MG 24 hr tablet          DISCHARGE INSTRUCTIONS:    Patient is to follow-up with his primary care  physician as outpatient  If you experience worsening of your admission symptoms, develop shortness of breath, life threatening emergency, suicidal or homicidal thoughts you must seek medical attention immediately by calling 911 or calling your MD immediately  if symptoms less severe.  You Must read complete instructions/literature along with all the possible adverse reactions/side effects for all the Medicines you take and that have been prescribed to you. Take any new Medicines after you have completely understood and accept all the possible adverse reactions/side effects.   Please note  You were cared for by a hospitalist during your hospital stay. If you have any questions about your discharge medications or the care you  received while you were in the hospital after you are discharged, you can call the unit and asked to speak with the hospitalist on call if the hospitalist that took care of you is not available. Once you are discharged, your primary care physician will handle any further medical issues. Please note that NO REFILLS for any discharge medications will be authorized once you are discharged, as it is imperative that you return to your primary care physician (or establish a relationship with a primary care physician if you do not have one) for your aftercare needs so that they can reassess your need for medications and monitor your lab values.    Today   CHIEF COMPLAINT:   Chief Complaint  Patient presents with  . Nausea    HISTORY OF PRESENT ILLNESS:  Russell Howard  is a 81 y.o. male with a known history of lung cancer, paracentesis, who presents to the hospital with nausea, shortness of breath, vomiting. On arrival to emergency room he was found to be hypoxic with O2 sats of 85% on room air. Chest x-ray revealed loculated pleural effusion on the right. The patient was tachycardic, not hypotensive, his labs revealed leukocytosis with white blood cell count of 12.9, too numerous to  count white blood cells on urine analysis, he was admitted to the hospital, initiated on broad-spectrum antibiotic therapy, IV fluids due to concerns of sepsis. His blood culture was positive for Staphylococcus, coagulase-negative, 1 out of 2 cultures. Urine culture showed less than 10,000 colony-forming units, no further identification was performed, patient was seen by infectious disease specialist, Dr. Ola Spurr, who recommended to continue antibiotic therapy with levofloxacin and vancomycin while in the hospital, change to levofloxacin and doxycycline when patient is ready to be discharged home TO COMPLETE 10 DAY COURSE. Patient's condition with conservative therapy improved. He was seen by physical therapist and recommended home health services. Because of abdominal discomfort, he underwent CT scan of his abdomen, revealing right hydronephrosis and hydroureter, which was felt by urologist to be due to reflux from his distended bladder, more frequent bladder catheterizations were recommended up to 3-4 times a day by urologist. Patient was felt to be stable to be discharged home with [facility] services. He qualified for oxygen therapy for home, which will be prescribed for him 24/ 7. Discussion by problem: . #1 Sepsis with UTI and bacteremia with CNS, ? Contamination Continue doxycycline and Levaquin for total 10 days, per Dr. Ola Spurr. Urine culture revealed less than 10,000 colony-forming units, insignificant growth, blood culture was - CNS 1 out of 2  #2 Hypotension.improved with normal saline bolus and monitor vital signs closely. Remains relatively stable, continue midodrine, hydrocortisone at home  #3 Dehydration. Improved with with IV fluid support  Discontinued normal saline IV due to hypoxia and concern of  CHF..  #4. Hypoxia: secondary to loculated right pleural effusion.   Supply supplemental oxygen as needed. Per Dr. Genevive Bi,  [place] surgeon, no aggressive treatment after discussed in this  patient's POA. The patient qualified for oxygen therapy at home, prescribed upon discharge  #5. Chronic congestive heart failure: Diastolic; ventricular paced with AICD. Resumed digoxin and  lasix as needed ,  oxygenation is stable, now on chronic oxygen therapy at 2 L, O2 sats were 95-97%  #6. Hypothyroidism: Continue Synthroid, follow TSH as outpatient  #7. Hyperlipidemia: Continue statin therapy  #8. Recurrent urinary tract infection: Continue methenamine.  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 BPH: Continue Flomax. The patient was advised  to continue diabetic catheterizations 3-4 times a day  #11. History of urinary retention, patient was seen by urologist, who felt that patient's right-sided hydronephrosis and hydroureter with due to urinary reflux, he recommended to continue more frequent bladder catheterizations, patient is to follow up with urology as outpatient . #12. Generalized weakness, PT evaluation suggests HHPT, patient was agreeable, will be followed by [facility].  #13 lower abdominal pain, etiology was likely bladder distention, relieved with bladder catheterization. CT of abdomen and pelvis revealed right hydronephrosis and hydroureter but no obstruction, likely reflux mediated, per urology    VITAL SIGNS:  Blood pressure 118/68, pulse 100, temperature 97.9 F (36.6 C), temperature source Oral, resp. rate 16, height '5\' 10"'$  (1.778 m), weight 79.1 kg (174 lb 6.4 oz), SpO2 95 %.  I/O:    Intake/Output Summary (Last 24 hours) at 01/14/16 1541 Last data filed at 01/14/16 1343  Gross per 24 hour  Intake             1200 ml  Output             1750 ml  Net             -550 ml    PHYSICAL EXAMINATION:  GENERAL:  81 y.o.-year-old patient lying in the bed with no acute distress.  EYES: Pupils equal, round, reactive to light and accommodation. No scleral icterus. Extraocular muscles intact.  HEENT: Head atraumatic,  normocephalic. Oropharynx and nasopharynx clear.  NECK:  Supple, no jugular venous distention. No thyroid enlargement, no tenderness.  LUNGS: Normal breath sounds bilaterally, no wheezing, rales,rhonchi or crepitation. No use of accessory muscles of respiration.  CARDIOVASCULAR: S1, S2 normal. No murmurs, rubs, or gallops.  ABDOMEN: Soft, non-tender, non-distended. Bowel sounds present. No organomegaly or mass.  EXTREMITIES: No pedal edema, cyanosis, or clubbing.  NEUROLOGIC: Cranial nerves II through XII are intact. Muscle strength 5/5 in all extremities. Sensation intact. Gait not checked.  PSYCHIATRIC: The patient is alert and oriented x 3.  SKIN: No obvious rash, lesion, or ulcer.   DATA REVIEW:   CBC  Recent Labs Lab 01/13/16 0441  WBC 7.7  HGB 12.0*  HCT 35.9*  PLT 184    Chemistries   Recent Labs Lab 01/09/16 0038  01/11/16 0429 01/13/16 0441 01/14/16 0435  NA 135  < > 134* 136  --   K 4.1  < > 3.9  --   --   CL 100*  < > 102  --   --   CO2 23  < > 26  --   --   GLUCOSE 166*  < > 91  --   --   BUN 38*  < > 19  --   --   CREATININE 1.01  < > 0.86  --  0.75  CALCIUM 9.2  < > 8.5*  --   --   AST 25  --   --   --   --   ALT 13*  --   --   --   --   ALKPHOS 79  --   --   --   --   BILITOT 1.3*  --   --   --   --   < > = values in this interval not displayed.  Cardiac Enzymes  Recent Labs Lab 01/09/16 0038  TROPONINI <0.03    Microbiology Results  Results for orders placed or performed during the hospital encounter of 01/09/16  Urine culture  Status: Abnormal   Collection Time: 01/09/16  1:41 AM  Result Value Ref Range Status   Specimen Description URINE, RANDOM  Final   Special Requests NONE  Final   Culture (A)  Final    <10,000 COLONIES/mL INSIGNIFICANT GROWTH Performed at [facility]    Report Status 01/10/2016 FINAL  Final  Blood culture (routine x 2)     Status: None   Collection Time: 01/09/16  4:32 AM  Result Value Ref Range  Status   Specimen Description BLOOD  R AC  Final   Special Requests   Final    BOTTLES DRAWN AEROBIC AND ANAEROBIC  AER 14 ML ANA 12 ML    Culture NO GROWTH 5 DAYS  Final   Report Status 01/14/2016 FINAL  Final  Blood culture (routine x 2)     Status: Abnormal   Collection Time: 01/09/16  4:32 AM  Result Value Ref Range Status   Specimen Description BLOOD BLOOD LEFT WRIST  Final   Special Requests   Final    BOTTLES DRAWN AEROBIC AND ANAEROBIC  AER 11 ML ANA 10 ML   Culture  Setup Time   Final    Organism ID to follow GRAM POSITIVE COCCI IN BOTH AEROBIC AND ANAEROBIC BOTTLES CRITICAL RESULT CALLED TO, READ BACK BY AND VERIFIED WITH: [place] AT 6948 01/10/16 SDR    Culture (A)  Final    STAPHYLOCOCCUS SPECIES (COAGULASE NEGATIVE) THE SIGNIFICANCE OF ISOLATING THIS ORGANISM FROM A SINGLE SET OF BLOOD CULTURES WHEN MULTIPLE SETS ARE DRAWN IS UNCERTAIN. PLEASE NOTIFY THE MICROBIOLOGY DEPARTMENT WITHIN ONE WEEK IF SPECIATION AND SENSITIVITIES ARE REQUIRED. Performed at [facility]    Report Status 01/12/2016 FINAL  Final  Blood Culture ID Panel (Reflexed)     Status: Abnormal   Collection Time: 01/09/16  4:32 AM  Result Value Ref Range Status   Enterococcus species NOT DETECTED NOT DETECTED Final   Listeria monocytogenes NOT DETECTED NOT DETECTED Final   Staphylococcus species DETECTED (A) NOT DETECTED Final    Comment: CRITICAL RESULT CALLED TO, READ BACK BY AND VERIFIED WITH:  NATE COOKSON AT 0547 01/10/16 SDR    Staphylococcus aureus NOT DETECTED NOT DETECTED Final   Methicillin resistance DETECTED (A) NOT DETECTED Final    Comment: CRITICAL RESULT CALLED TO, READ BACK BY AND VERIFIED WITH:  NATE COOKSON AT 0547 01/10/16 SDR    Streptococcus species NOT DETECTED NOT DETECTED Final   Streptococcus agalactiae NOT DETECTED NOT DETECTED Final   Streptococcus pneumoniae NOT DETECTED NOT DETECTED Final   Streptococcus pyogenes NOT DETECTED NOT DETECTED Final    Acinetobacter baumannii NOT DETECTED NOT DETECTED Final   Enterobacteriaceae species NOT DETECTED NOT DETECTED Final   Enterobacter cloacae complex NOT DETECTED NOT DETECTED Final   Escherichia coli NOT DETECTED NOT DETECTED Final   Klebsiella oxytoca NOT DETECTED NOT DETECTED Final   Klebsiella pneumoniae NOT DETECTED NOT DETECTED Final   Proteus species NOT DETECTED NOT DETECTED Final   Serratia marcescens NOT DETECTED NOT DETECTED Final   Haemophilus influenzae NOT DETECTED NOT DETECTED Final   Neisseria meningitidis NOT DETECTED NOT DETECTED Final   Pseudomonas aeruginosa NOT DETECTED NOT DETECTED Final   Candida albicans NOT DETECTED NOT DETECTED Final   Candida glabrata NOT DETECTED NOT DETECTED Final   Candida krusei NOT DETECTED NOT DETECTED Final   Candida parapsilosis NOT DETECTED NOT DETECTED Final   Candida tropicalis NOT DETECTED NOT DETECTED Final  MRSA PCR Screening  Status: None   Collection Time: 01/09/16  8:57 AM  Result Value Ref Range Status   MRSA by PCR NEGATIVE NEGATIVE Final    Comment:        The GeneXpert MRSA Assay (FDA approved for NASAL specimens only), is one component of a comprehensive MRSA colonization surveillance program. It is not intended to diagnose MRSA infection nor to guide or monitor treatment for MRSA infections.     RADIOLOGY:  Ct Abdomen Pelvis W Contrast  Result Date: 01/13/2016 CLINICAL DATA:  UTI. Suprapubic pain. Hypotension, nausea and fatigue. EXAM: CT ABDOMEN AND PELVIS WITH CONTRAST TECHNIQUE: Multidetector CT imaging of the abdomen and pelvis was performed using the standard protocol following bolus administration of intravenous contrast. CONTRAST:  166m ISOVUE-300 IOPAMIDOL (ISOVUE-300) INJECTION 61% COMPARISON:  06/14/2015 FINDINGS: Lower chest: There is an enhancing right pleural effusion. Interstitial thickening is seen in the visualized portion of the right lower lobe. Areas of increasing interstitial thickening in  predominantly subpleural distribution is seen throughout the visualized portion of the left lung. There is a large hiatal hernia. Cardiac pacemaker is partially visualized. Hepatobiliary: No focal liver abnormality is seen. No gallstones, gallbladder wall thickening, or biliary dilatation. The gallbladder however is collapsed and therefore poorly evaluated. Pancreas: Unremarkable. No pancreatic ductal dilatation or surrounding inflammatory changes. Spleen: Normal in size without focal abnormality. Adrenals/Urinary Tract: There are 2 less than a cm indeterminate in appearance right renal masses. There is mild right hydronephrosis and hydroureter. The left kidney and bilateral adrenal glands have normal appearance. Distension and diffuse urinary bladder wall thickening are seen, persistent finding from the prior PET-CT dated 03/02/2015. Stomach/Bowel: Stomach is within normal limits. Appendix appears normal. No evidence of bowel wall thickening, distention, or inflammatory changes. Vascular/Lymphatic: Aortic atherosclerosis. No enlarged abdominal or pelvic lymph nodes. Reproductive: The prostate gland is enlarged and contains coarse calcifications. Other:  None. Musculoskeletal: Osteoarthritic changes of the lumbosacral spine. IMPRESSION: Enhancing moderate in size right pleural effusion. Bilateral lower lobe interstitial thickening. This may represent lymphangitic spread of malignancy or chronic interstitial lung changes. Two less than a cm, too small to be actually characterize right renal masses. Right hydronephrosis and hydroureter to the level of the vesicoureteral junction. No obstructing ureteral calculus is seen. Distension and diffuse nodular and linear urinary bladder wall thickening, which may be the cause of the right hydronephrosis and hydroureter. Electronically Signed   By: DFidela SalisburyM.D.   On: 01/13/2016 16:28    EKG:   Orders placed or performed during the hospital encounter of 01/09/16   . EKG 12-Lead  . EKG 12-Lead  . ED EKG  . ED EKG      Management plans discussed with the patient, family and they are in agreement.  CODE STATUS:     Code Status Orders        Start     Ordered   01/09/16 [id number] Do not attempt resuscitation (DNR)  Continuous    Question Answer Comment  In the event of cardiac or respiratory ARREST Do not call a "code blue"   In the event of cardiac or respiratory ARREST Do not perform Intubation, CPR, defibrillation or ACLS   In the event of cardiac or respiratory ARREST Use medication by any route, position, wound care, and other measures to relive pain and suffering. May use oxygen, suction and manual treatment of airway obstruction as needed for comfort.      01/09/16 08921   Code Status History  Date Active Date Inactive Code Status Order ID Comments User Context   11/09/2015  3:18 AM 11/12/2015  5:13 PM DNR [id number]  Harrie Foreman, MD Inpatient   06/14/2015  4:39 PM 06/18/2015  3:13 PM DNR [id number]  Demetrios Loll, MD Inpatient    Advance Directive Documentation   Flowsheet Row Most Recent Value  Type of Advance Directive  Healthcare Power of Attorney, Living will  Pre-existing out of facility DNR order (yellow form or pink MOST form)  No data  "MOST" Form in Place?  No data      TOTAL TIME TAKING CARE OF THIS PATIENT: 40 minutes.    Theodoro Grist M.D on 01/14/2016 at 3:41 PM  Between 7am to 6pm - Pager - [phone]  After 6pm go to [email or web address] - password [email or web address] [facility]  Office  [phone]  CC: Primary care physician; [name], MD

## 2016-01-14 NOTE — Progress Notes (Signed)
Pharmacy Antibiotic Note  Russell Howard is a 81 y.o. male admitted on 01/09/2016 with sepsis.  Pharmacy has been consulted for levofloxacin dosing. Plan is for a total of 10 days of antibiotics with doxycycline and levofloxacin per ID.   Plan: Continue levofloxacin 750 mg oral daily.    Height: '5\' 10"'$  (177.8 cm) Weight: 174 lb 6.4 oz (79.1 kg) IBW/kg (Calculated) : 73  Temp (24hrs), Avg:97.7 F (36.5 C), Min:97.6 F (36.4 C), Max:97.7 F (36.5 C)   Recent Labs Lab 01/09/16 0038 01/09/16 1910 01/09/16 2233 01/10/16 0458 01/11/16 0429 01/11/16 2123 01/13/16 0441 01/14/16 0435  WBC 12.9*  --   --  12.0* 9.4  --  7.7  --   CREATININE 1.01  --   --  1.09 0.86  --   --  0.75  LATICACIDVEN 1.3 2.3* 1.0  --   --   --   --   --   VANCOTROUGH  --   --   --   --   --  12*  --   --     Estimated Creatinine Clearance: 78.6 mL/min (by C-G formula based on SCr of 0.75 mg/dL).    Allergies  Allergen Reactions  . Penicillin G Hives    Has patient had a PCN reaction causing immediate rash, facial/tongue/throat swelling, SOB or lightheadedness with hypotension: Yes Has patient had a PCN reaction causing severe rash involving mucus membranes or skin necrosis: No Has patient had a PCN reaction that required hospitalization No Has patient had a PCN reaction occurring within the last 10 years: No If all of the above answers are "NO", then may proceed with Cephalosporin use.  . Sulfa Antibiotics Rash   Anti-infectives    Start     Dose/Rate Route Frequency Ordered Stop   01/13/16 2200  doxycycline (VIBRA-TABS) tablet 100 mg     100 mg Oral Every 12 hours 01/13/16 1730     01/13/16 1800  levofloxacin (LEVAQUIN) tablet 750 mg     750 mg Oral Every 24 hours 01/13/16 0723     01/13/16 0000  levofloxacin (LEVAQUIN) 750 MG tablet     750 mg Oral Every 24 hours 01/13/16 1110     01/13/16 0000  doxycycline (VIBRAMYCIN) 100 MG capsule     100 mg Oral 2 times daily 01/13/16 1110     01/12/16 0800   vancomycin (VANCOCIN) IVPB 1000 mg/200 mL premix  Status:  Discontinued     1,000 mg 200 mL/hr over 60 Minutes Intravenous Every 12 hours 01/11/16 2227 01/13/16 1730   01/10/16 0000  levofloxacin (LEVAQUIN) IVPB 750 mg  Status:  Discontinued     750 mg 100 mL/hr over 90 Minutes Intravenous Daily at bedtime 01/09/16 2300 01/13/16 0723   01/10/16 0000  vancomycin (VANCOCIN) IVPB 750 mg/150 ml premix     750 mg 150 mL/hr over 60 Minutes Intravenous 2 times daily 01/09/16 2300 01/11/16 2223   01/09/16 1400  vancomycin (VANCOCIN) IVPB 750 mg/150 ml premix  Status:  Discontinued     750 mg 150 mL/hr over 60 Minutes Intravenous Every 12 hours 01/09/16 0709 01/09/16 1207   01/09/16 0645  vancomycin (VANCOCIN) IVPB 1000 mg/200 mL premix     1,000 mg 200 mL/hr over 60 Minutes Intravenous  Once 01/09/16 0631 01/09/16 0750   01/09/16 0415  levofloxacin (LEVAQUIN) IVPB 750 mg     750 mg 100 mL/hr over 90 Minutes Intravenous  Once 01/09/16 0404 01/09/16 9702  Results for orders placed or performed during the hospital encounter of 01/09/16  Urine culture     Status: Abnormal   Collection Time: 01/09/16  1:41 AM  Result Value Ref Range Status   Specimen Description URINE, RANDOM  Final   Special Requests NONE  Final   Culture (A)  Final    <10,000 COLONIES/mL INSIGNIFICANT GROWTH Performed at [facility]    Report Status 01/10/2016 FINAL  Final  Blood culture (routine x 2)     Status: None   Collection Time: 01/09/16  4:32 AM  Result Value Ref Range Status   Specimen Description BLOOD  R AC  Final   Special Requests   Final    BOTTLES DRAWN AEROBIC AND ANAEROBIC  AER 14 ML ANA 12 ML    Culture NO GROWTH 5 DAYS  Final   Report Status 01/14/2016 FINAL  Final  Blood culture (routine x 2)     Status: Abnormal   Collection Time: 01/09/16  4:32 AM  Result Value Ref Range Status   Specimen Description BLOOD BLOOD LEFT WRIST  Final   Special Requests   Final    BOTTLES DRAWN AEROBIC  AND ANAEROBIC  AER 11 ML ANA 10 ML   Culture  Setup Time   Final    Organism ID to follow [place] AND ANAEROBIC BOTTLES CRITICAL RESULT CALLED TO, READ BACK BY AND VERIFIED WITH: Metzger AT [id number] 01/10/16 SDR    Culture (A)  Final    STAPHYLOCOCCUS SPECIES (COAGULASE NEGATIVE) THE SIGNIFICANCE OF ISOLATING THIS ORGANISM FROM A SINGLE SET OF BLOOD CULTURES WHEN MULTIPLE SETS ARE DRAWN IS UNCERTAIN. PLEASE NOTIFY THE MICROBIOLOGY DEPARTMENT WITHIN ONE WEEK IF SPECIATION AND SENSITIVITIES ARE REQUIRED. Performed at [facility]    Report Status 01/12/2016 FINAL  Final  Blood Culture ID Panel (Reflexed)     Status: Abnormal   Collection Time: 01/09/16  4:32 AM  Result Value Ref Range Status   Enterococcus species NOT DETECTED NOT DETECTED Final   Listeria monocytogenes NOT DETECTED NOT DETECTED Final   Staphylococcus species DETECTED (A) NOT DETECTED Final    Comment: CRITICAL RESULT CALLED TO, READ BACK BY AND VERIFIED WITH:  NATE COOKSON AT 0547 01/10/16 SDR    Staphylococcus aureus NOT DETECTED NOT DETECTED Final   Methicillin resistance DETECTED (A) NOT DETECTED Final    Comment: CRITICAL RESULT CALLED TO, READ BACK BY AND VERIFIED WITH:  NATE COOKSON AT 0547 01/10/16 SDR    Streptococcus species NOT DETECTED NOT DETECTED Final   Streptococcus agalactiae NOT DETECTED NOT DETECTED Final   Streptococcus pneumoniae NOT DETECTED NOT DETECTED Final   Streptococcus pyogenes NOT DETECTED NOT DETECTED Final   Acinetobacter baumannii NOT DETECTED NOT DETECTED Final   Enterobacteriaceae species NOT DETECTED NOT DETECTED Final   Enterobacter cloacae complex NOT DETECTED NOT DETECTED Final   Escherichia coli NOT DETECTED NOT DETECTED Final   Klebsiella oxytoca NOT DETECTED NOT DETECTED Final   Klebsiella pneumoniae NOT DETECTED NOT DETECTED Final   Proteus species NOT DETECTED NOT DETECTED Final   Serratia marcescens NOT DETECTED NOT DETECTED Final    Haemophilus influenzae NOT DETECTED NOT DETECTED Final   Neisseria meningitidis NOT DETECTED NOT DETECTED Final   Pseudomonas aeruginosa NOT DETECTED NOT DETECTED Final   Candida albicans NOT DETECTED NOT DETECTED Final   Candida glabrata NOT DETECTED NOT DETECTED Final   Candida krusei NOT DETECTED NOT DETECTED Final   Candida parapsilosis NOT DETECTED  NOT DETECTED Final   Candida tropicalis NOT DETECTED NOT DETECTED Final  MRSA PCR Screening     Status: None   Collection Time: 01/09/16  8:57 AM  Result Value Ref Range Status   MRSA by PCR NEGATIVE NEGATIVE Final    Comment:        The GeneXpert MRSA Assay (FDA approved for NASAL specimens only), is one component of a comprehensive MRSA colonization surveillance program. It is not intended to diagnose MRSA infection nor to guide or monitor treatment for MRSA infections.    Thank you for allowing pharmacy to be a part of this patient's care.  [name] D, PharmD Clinical Pharmacist 01/14/2016 9:15 AM

## 2016-01-14 NOTE — Telephone Encounter (Signed)
-----   Message from Festus Aloe, MD sent at 01/14/2016  9:15 AM EST ----- See Dr. Erlene Quan or Larene Beach in 1-2 weeks. Thanks. Hospital f/u.

## 2016-01-14 NOTE — Progress Notes (Signed)
Physical Therapy Treatment Patient Details Name: Russell Howard MRN: [id number] DOB: August 07, 1934 Today's Date: 01/14/2016    History of Present Illness 81 y/o male with history of lung cancer as well as multiple thoracenteses for pleural effusion who came to ED complaining of nausea and shortness of breath. He's been getting weaker and weaker recently, but the last few days has been feeling particularly weak, fatigued and nauseous - with a few episodes of vomiting. He was hypoxic on room air, sats have been up and down.    PT Comments    Pt agreeable to PT today; however, complains of shortness of breath and abdominal pain that increases with movement. Pt does not wish up/out of bed at this time. Pt participates in supine bed exercises without requiring physical assist, but cues for proper breathing techniques and rest between exercises for O2 saturation management. Initial O2 saturation reading 95% on 1 liter; decreases to 90-92% with minimal activity. Pt lying fairly flat and states that it is more difficult for him to breathe with sitting more upright. Pt has achieved goals set on previous session; although inconsistent function today. At the time of this document pt has a discharge order to home where pt would benefit from home health PT to continue to progress strength and activity tolerance with save O2 saturation levels to improve function to baseline.   Follow Up Recommendations  Home health PT     Equipment Recommendations       Recommendations for Other Services       Precautions / Restrictions Precautions Precautions: Fall Restrictions Weight Bearing Restrictions: No    Mobility  Bed Mobility               General bed mobility comments: Not tested; pt feeling too fatigued and short of breath with minimal actiivity  Transfers                    Ambulation/Gait                 Stairs            Wheelchair Mobility    Modified Rankin (Stroke  Patients Only)       Balance                                    Cognition Arousal/Alertness: Awake/alert Behavior During Therapy: WFL for tasks assessed/performed Overall Cognitive Status: Within Functional Limits for tasks assessed                 General Comments: Pt fatigued/SOB and gets more so causing dizziness with supine exercises    Exercises General Exercises - Lower Extremity Ankle Circles/Pumps: AROM;Both;20 reps;Supine Quad Sets: Strengthening;Both;Supine;20 reps Gluteal Sets: Strengthening;Both;20 reps;Supine Short Arc Quad: AROM;Both;20 reps;Supine Heel Slides: AROM;Both;10 reps;Supine Hip ABduction/ADduction: AROM;Both;10 reps;Supine Straight Leg Raises: Both;10 reps;Supine;AROM Other Exercises Other Exercises: Requires rest periods between exercises to manage SOB. O2 saturation decreases to 90-92% with minimal exertion. Takes couple of min to recover to 95-96%     General Comments        Pertinent Vitals/Pain Pain Assessment: 0-10 Pain Score: 3  (hurts more with movement) Pain Location: low abdomen Pain Descriptors / Indicators: Aching Pain Intervention(s): Limited activity within patient's tolerance;Monitored during session    Home Living  Prior Function            PT Goals (current goals can now be found in the care plan section) Progress towards PT goals: Progressing toward goals    Frequency    Min 2X/week      PT Plan Current plan remains appropriate    Co-evaluation             End of Session Equipment Utilized During Treatment: Oxygen Activity Tolerance: Patient limited by fatigue;Patient limited by pain Patient left: in bed;with call bell/phone within reach;with bed alarm set     Time: 2003-7944 PT Time Calculation (min) (ACUTE ONLY): 31 min  Charges:  $Therapeutic Exercise: 23-37 mins                    G CodesLarae Grooms, PTA 01/14/2016, 12:31 PM

## 2016-01-15 ENCOUNTER — Telehealth: Payer: Self-pay | Admitting: *Deleted

## 2016-01-15 NOTE — Progress Notes (Signed)
Chaplain was making his rounds and visited with pt in room 115. Provided the ministry of prayer.    01/14/16 1745  Clinical Encounter Type  Visited With Patient;Patient and family together  Visit Type Initial;Spiritual support  Referral From Nurse  Spiritual Encounters  Spiritual Needs Prayer

## 2016-01-15 NOTE — Telephone Encounter (Signed)
Patient discharging from hospital and states she faxed orders over for Dr B to sign for Home Health and Palliative care. Informed that patient was discharged from clinic as he did not want anything done and she should contact Dr Doy Hutching his PCP

## 2016-01-23 ENCOUNTER — Ambulatory Visit: Payer: Medicare Other | Admitting: Urology

## 2016-01-23 ENCOUNTER — Encounter: Payer: Self-pay | Admitting: Urology

## 2016-01-23 ENCOUNTER — Ambulatory Visit (INDEPENDENT_AMBULATORY_CARE_PROVIDER_SITE_OTHER): Payer: Medicare Other | Admitting: Urology

## 2016-01-23 VITALS — BP 105/71 | HR 108 | Ht 70.0 in | Wt 163.0 lb

## 2016-01-23 DIAGNOSIS — R339 Retention of urine, unspecified: Secondary | ICD-10-CM | POA: Diagnosis not present

## 2016-01-23 NOTE — Progress Notes (Signed)
01/23/2016 1:18 PM   Russell Howard [birth year] [id number]  Referring provider: [name], MD Russell Howard [facility], [place] [id number]  Chief Complaint  Patient presents with  . Post-op Problem    1 wk post sepsis    HPI: Patient is a 81 year old Caucasian male with stage III lung cancer with urinary retention on clean intermittent catheterization and a history of recurrent urinary tract infections who presents today after being hospitalized for sepsis due to Staphylococcus, coagulase-negative.  Urine culture had insignificant growth.  Daughter, Russell Howard, is present for this visit.    During his hospitalization, he was found to have mild right hydroureteronephrosis to a distended bladder. The kidneys enhance symmetrically and there is prompt excretion although slightly less on the right. The ureter was widely patent to the bladder on a CT that was performed due to the patient's complaint of right lower quadrant pain.  Urology consult was obtained and no new recommendations were given.  (see Dr. Lyndal Howard note from 01/14/2016)  Patient's daughter states that he was not being cathed while he was in the hospital twice daily.   They would cath him sparingly during his admission which may explain the CT findings.    Patient and his daughter states that since he has been released from the hospital he has not been able to void on his own.   He continues to CIC twice daily with residuals around 600 cc.    History of urethral stricture S/p in office urethral dilation by Dr. Elnoria Howard 11/2014 with filiform and followers  Recurrent UTIs Reports being treated proximally once per month over the past 6 months for urinary tract infection. With each infection, he has generalized malaise, fatigue, and feels just "crappy".  Generally, he is had no significant associated urinary symptoms with the infection other than on 1 single occasion (urinary frequency/lower abdominal pressure).      Currently on probiotic and hipprex for UTI prevention..    Today, he denies any UTI symptoms.   PMH: Past Medical History:  Diagnosis Date  . Acquired hypothyroidism 11/02/2014  . BPH (benign prostatic hyperplasia)   . Cardiac defibrillator in place 11/02/2014  . Cardiomyopathy ([place])   . CHF (congestive heart failure) ([place])   . Chronic diastolic heart failure ([place]) 11/02/2014  . Gastro-esophageal reflux disease without esophagitis 11/02/2014  . H/O malignant neoplasm 11/09/2014  . Heart valve disease 11/02/2014  . History of atrial fibrillation 11/02/2014  . Lung cancer ([place])   . Neuropathy ([place]) 11/02/2014  . Orthostasis 11/02/2014  . Pure hypercholesterolemia 11/02/2014  . Valvular heart disease     Surgical History: Past Surgical History:  Procedure Laterality Date  . APPENDECTOMY    . DUAL ICD IMPLANT  2007/2009   implantable cardiac defibrillator  . PORT-A-CATH REMOVAL      Home Medications:  Allergies as of 01/23/2016      Reactions   Penicillin G Hives   Has patient had a PCN reaction causing immediate rash, facial/tongue/throat swelling, SOB or lightheadedness with hypotension: Yes Has patient had a PCN reaction causing severe rash involving mucus membranes or skin necrosis: No Has patient had a PCN reaction that required hospitalization No Has patient had a PCN reaction occurring within the last 10 years: No If all of the above answers are "NO", then may proceed with Cephalosporin use.   Sulfa Antibiotics Rash      Medication List       Accurate as of 01/23/16  1:18 PM. Always use your most recent med list.          acidophilus Caps capsule Take 1 capsule by mouth daily.   aspirin EC 81 MG tablet Take 81 mg by mouth daily.   chlorpheniramine-HYDROcodone 10-8 MG/5ML Suer Commonly known as:  TUSSIONEX Take 5 mLs by mouth every 4 (four) hours as needed for cough.   Cholecalciferol 5000 units Tabs Take 5,000 Units by mouth every morning.   DIGOX 0.25  MG tablet Generic drug:  digoxin Take 250 mcg by mouth daily.   furosemide 20 MG tablet Commonly known as:  LASIX Take 20 mg by mouth every other day as needed (as needed for water retention).   gabapentin 300 MG capsule Commonly known as:  NEURONTIN Take 300 mg by mouth 3 (three) times daily.   hydrocortisone 10 MG tablet Commonly known as:  CORTEF Take 10 mg by mouth 2 (two) times daily.   levothyroxine 50 MCG tablet Commonly known as:  SYNTHROID, LEVOTHROID Take 50 mcg by mouth daily before breakfast.   MAGNESIUM-OXIDE 400 (241.3 Mg) MG tablet Generic drug:  magnesium oxide Take 400 mg by mouth daily.   methenamine 1 g tablet Commonly known as:  HIPREX TAKE 1 TABLET(1 GRAM) BY MOUTH TWICE DAILY WITH A MEAL   midodrine 5 MG tablet Commonly known as:  PROAMATINE Take 5 mg by mouth 3 (three) times daily with meals.   multivitamin with minerals Tabs tablet Take 1 tablet by mouth daily.   pantoprazole 40 MG tablet Commonly known as:  PROTONIX Take 40 mg by mouth daily before breakfast.   potassium chloride 10 MEQ CR capsule Commonly known as:  MICRO-K Take 10 mEq by mouth daily.   pyridostigmine 60 MG tablet Commonly known as:  MESTINON Take 60 mg by mouth 2 (two) times daily.   ranitidine 300 MG tablet Commonly known as:  ZANTAC Take 300 mg by mouth at bedtime.   SAW PALMETTO-PUMPKIN SEED OIL PO Take 1 capsule by mouth daily.   simvastatin 20 MG tablet Commonly known as:  ZOCOR Take 20 mg by mouth daily at 6 PM.   TH CO Q-10 100 MG capsule Generic drug:  Coenzyme Q10 Take 100 mg by mouth daily.   vitamin B-12 500 MCG tablet Commonly known as:  CYANOCOBALAMIN Take 500 mcg by mouth daily.   vitamin C 1000 MG tablet Take 500 mg by mouth 3 (three) times daily. Reported on 06/14/2015   vitamin E 400 UNIT capsule Take 400 Units by mouth daily.       Allergies:  Allergies  Allergen Reactions  . Penicillin G Hives    Has patient had a PCN reaction  causing immediate rash, facial/tongue/throat swelling, SOB or lightheadedness with hypotension: Yes Has patient had a PCN reaction causing severe rash involving mucus membranes or skin necrosis: No Has patient had a PCN reaction that required hospitalization No Has patient had a PCN reaction occurring within the last 10 years: No If all of the above answers are "NO", then may proceed with Cephalosporin use.  . Sulfa Antibiotics Rash    Family History: Family History  Problem Relation Age of Onset  . Hypertension    . Heart disease      Social History:  reports that he quit smoking about 34 years ago. His smoking use included Cigarettes. He has never used smokeless tobacco. He reports that he does not drink alcohol or use drugs.  ROS: UROLOGY Frequent Urination?: No Hard to postpone  urination?: No Burning/pain with urination?: No Get up at night to urinate?: No Leakage of urine?: No Urine stream starts and stops?: No Trouble starting stream?: Yes Do you have to strain to urinate?: Yes Blood in urine?: No Urinary tract infection?: Yes Sexually transmitted disease?: No Injury to kidneys or bladder?: No Painful intercourse?: No Weak stream?: No Erection problems?: No Penile pain?: No  Gastrointestinal Nausea?: No Vomiting?: No Indigestion/heartburn?: No Diarrhea?: No Constipation?: No  Constitutional Fever: No Night sweats?: No Weight loss?: No Fatigue?: Yes  Skin Skin rash/lesions?: No Itching?: No  Eyes Blurred vision?: No Double vision?: No  Ears/Nose/Throat Sore throat?: No Sinus problems?: Yes  Hematologic/Lymphatic Swollen glands?: No Easy bruising?: No  Cardiovascular Leg swelling?: No Chest pain?: No  Respiratory Cough?: Yes Shortness of breath?: Yes  Endocrine Excessive thirst?: No  Musculoskeletal Back pain?: No Joint pain?: No  Neurological Headaches?: No Dizziness?: Yes  Psychologic Depression?: No Anxiety?: No  Physical  Exam: BP 105/71   Pulse (!) 108   Ht '5\' 10"'$  (1.778 m)   Wt 163 lb (73.9 kg)   BMI 23.39 kg/m   Constitutional:  Alert and oriented, No acute distress.  Presents with daughter.  In wheelchair. HEENT: [place] AT, moist mucus membranes.  Trachea midline, no masses. Cardiovascular: No clubbing, cyanosis, or edema. Respiratory: Normal respiratory effort, no increased work of breathing.  Skin: No rashes, bruises or suspicious lesions. Neurologic: Grossly intact, no focal deficits, moving all 4 extremities. Psychiatric: Normal mood and affect.  Laboratory Data: Lab Results  Component Value Date   WBC 7.7 01/13/2016   HGB 12.0 (L) 01/13/2016   HCT 35.9 (L) 01/13/2016   MCV 89.0 01/13/2016   PLT 184 01/13/2016    Lab Results  Component Value Date   CREATININE 0.75 01/14/2016   Urinalysis Results for orders placed or performed during the hospital encounter of 01/09/16  Urine culture  Result Value Ref Range   Specimen Description URINE, RANDOM    Special Requests NONE    Culture (A)     <10,000 COLONIES/mL INSIGNIFICANT GROWTH Performed at [facility]    Report Status 01/10/2016 FINAL   Blood culture (routine x 2)  Result Value Ref Range   Specimen Description BLOOD  R AC    Special Requests      BOTTLES DRAWN AEROBIC AND ANAEROBIC  AER 14 ML ANA 12 ML    Culture NO GROWTH 5 DAYS    Report Status 01/14/2016 FINAL   Blood culture (routine x 2)  Result Value Ref Range   Specimen Description BLOOD BLOOD LEFT WRIST    Special Requests      BOTTLES DRAWN AEROBIC AND ANAEROBIC  AER 11 ML ANA 10 ML   Culture  Setup Time      Organism ID to follow GRAM POSITIVE COCCI IN BOTH AEROBIC AND ANAEROBIC BOTTLES CRITICAL RESULT CALLED TO, READ BACK BY AND VERIFIED WITH: [place] [facility] [place] 01/10/16 SDR    Culture (A)     STAPHYLOCOCCUS SPECIES (COAGULASE NEGATIVE) THE SIGNIFICANCE OF ISOLATING THIS ORGANISM FROM A SINGLE SET OF BLOOD CULTURES WHEN MULTIPLE SETS ARE DRAWN IS  UNCERTAIN. PLEASE NOTIFY THE MICROBIOLOGY DEPARTMENT WITHIN ONE WEEK IF SPECIATION AND SENSITIVITIES ARE REQUIRED. Performed at [facility]    Report Status 01/12/2016 FINAL   MRSA PCR Screening  Result Value Ref Range   MRSA by PCR NEGATIVE NEGATIVE  Blood Culture ID Panel (Reflexed)  Result Value Ref Range   Enterococcus species NOT DETECTED  NOT DETECTED   Listeria monocytogenes NOT DETECTED NOT DETECTED   Staphylococcus species DETECTED (A) NOT DETECTED   Staphylococcus aureus NOT DETECTED NOT DETECTED   Methicillin resistance DETECTED (A) NOT DETECTED   Streptococcus species NOT DETECTED NOT DETECTED   Streptococcus agalactiae NOT DETECTED NOT DETECTED   Streptococcus pneumoniae NOT DETECTED NOT DETECTED   Streptococcus pyogenes NOT DETECTED NOT DETECTED   Acinetobacter baumannii NOT DETECTED NOT DETECTED   Enterobacteriaceae species NOT DETECTED NOT DETECTED   Enterobacter cloacae complex NOT DETECTED NOT DETECTED   Escherichia coli NOT DETECTED NOT DETECTED   Klebsiella oxytoca NOT DETECTED NOT DETECTED   Klebsiella pneumoniae NOT DETECTED NOT DETECTED   Proteus species NOT DETECTED NOT DETECTED   Serratia marcescens NOT DETECTED NOT DETECTED   Haemophilus influenzae NOT DETECTED NOT DETECTED   Neisseria meningitidis NOT DETECTED NOT DETECTED   Pseudomonas aeruginosa NOT DETECTED NOT DETECTED   Candida albicans NOT DETECTED NOT DETECTED   Candida glabrata NOT DETECTED NOT DETECTED   Candida krusei NOT DETECTED NOT DETECTED   Candida parapsilosis NOT DETECTED NOT DETECTED   Candida tropicalis NOT DETECTED NOT DETECTED  CBC  Result Value Ref Range   WBC 12.9 (H) 3.8 - 10.6 K/uL   RBC 4.79 4.40 - 5.90 MIL/uL   Hemoglobin 14.0 13.0 - 18.0 g/dL   HCT 42.4 40.0 - 52.0 %   MCV 88.4 80.0 - 100.0 fL   MCH 29.3 26.0 - 34.0 pg   MCHC 33.1 32.0 - 36.0 g/dL   RDW 16.4 (H) 11.5 - 14.5 %   Platelets 175 150 - 440 K/uL  Comprehensive metabolic panel  Result Value Ref Range    Sodium 135 135 - 145 mmol/L   Potassium 4.1 3.5 - 5.1 mmol/L   Chloride 100 (L) 101 - 111 mmol/L   CO2 23 22 - 32 mmol/L   Glucose, Bld 166 (H) 65 - 99 mg/dL   BUN 38 (H) 6 - 20 mg/dL   Creatinine, Ser 1.01 0.61 - 1.24 mg/dL   Calcium 9.2 8.9 - 10.3 mg/dL   Total Protein 8.3 (H) 6.5 - 8.1 g/dL   Albumin 3.9 3.5 - 5.0 g/dL   AST 25 15 - 41 U/L   ALT 13 (L) 17 - 63 U/L   Alkaline Phosphatase 79 38 - 126 U/L   Total Bilirubin 1.3 (H) 0.3 - 1.2 mg/dL   GFR calc non Af Amer >60 >60 mL/min   GFR calc Af Amer >60 >60 mL/min   Anion gap 12 5 - 15  Troponin I  Result Value Ref Range   Troponin I <0.03 <0.03 ng/mL  Lactic acid, plasma  Result Value Ref Range   Lactic Acid, Venous 1.3 0.5 - 1.9 mmol/L  Urinalysis, Complete w Microscopic  Result Value Ref Range   Color, Urine YELLOW (A) YELLOW   APPearance HAZY (A) CLEAR   Specific Gravity, Urine 1.015 1.005 - 1.030   pH 5.0 5.0 - 8.0   Glucose, UA NEGATIVE NEGATIVE mg/dL   Hgb urine dipstick NEGATIVE NEGATIVE   Bilirubin Urine NEGATIVE NEGATIVE   Ketones, ur 5 (A) NEGATIVE mg/dL   Protein, ur 30 (A) NEGATIVE mg/dL   Nitrite NEGATIVE NEGATIVE   Leukocytes, UA MODERATE (A) NEGATIVE   RBC / HPF 6-30 0 - 5 RBC/hpf   WBC, UA TOO NUMEROUS TO COUNT 0 - 5 WBC/hpf   Bacteria, UA NONE SEEN NONE SEEN   Squamous Epithelial / LPF 0-5 (A) NONE SEEN   Mucous  PRESENT   TSH  Result Value Ref Range   TSH 9.708 (H) 0.350 - 4.500 uIU/mL  CBC  Result Value Ref Range   WBC 12.0 (H) 3.8 - 10.6 K/uL   RBC 4.46 4.40 - 5.90 MIL/uL   Hemoglobin 13.3 13.0 - 18.0 g/dL   HCT 39.5 (L) 40.0 - 52.0 %   MCV 88.5 80.0 - 100.0 fL   MCH 29.8 26.0 - 34.0 pg   MCHC 33.7 32.0 - 36.0 g/dL   RDW 16.9 (H) 11.5 - 14.5 %   Platelets 152 150 - 440 K/uL  Basic metabolic panel  Result Value Ref Range   Sodium 135 135 - 145 mmol/L   Potassium 3.9 3.5 - 5.1 mmol/L   Chloride 100 (L) 101 - 111 mmol/L   CO2 26 22 - 32 mmol/L   Glucose, Bld 92 65 - 99 mg/dL   BUN  33 (H) 6 - 20 mg/dL   Creatinine, Ser 1.09 0.61 - 1.24 mg/dL   Calcium 8.7 (L) 8.9 - 10.3 mg/dL   GFR calc non Af Amer >60 >60 mL/min   GFR calc Af Amer >60 >60 mL/min   Anion gap 9 5 - 15  Lactic acid, plasma  Result Value Ref Range   Lactic Acid, Venous 2.3 (HH) 0.5 - 1.9 mmol/L  Lactic acid, plasma  Result Value Ref Range   Lactic Acid, Venous 1.0 0.5 - 1.9 mmol/L  Basic metabolic panel  Result Value Ref Range   Sodium 134 (L) 135 - 145 mmol/L   Potassium 3.9 3.5 - 5.1 mmol/L   Chloride 102 101 - 111 mmol/L   CO2 26 22 - 32 mmol/L   Glucose, Bld 91 65 - 99 mg/dL   BUN 19 6 - 20 mg/dL   Creatinine, Ser 0.86 0.61 - 1.24 mg/dL   Calcium 8.5 (L) 8.9 - 10.3 mg/dL   GFR calc non Af Amer >60 >60 mL/min   GFR calc Af Amer >60 >60 mL/min   Anion gap 6 5 - 15  CBC  Result Value Ref Range   WBC 9.4 3.8 - 10.6 K/uL   RBC 4.21 (L) 4.40 - 5.90 MIL/uL   Hemoglobin 12.5 (L) 13.0 - 18.0 g/dL   HCT 37.0 (L) 40.0 - 52.0 %   MCV 87.8 80.0 - 100.0 fL   MCH 29.7 26.0 - 34.0 pg   MCHC 33.8 32.0 - 36.0 g/dL   RDW 16.7 (H) 11.5 - 14.5 %   Platelets 137 (L) 150 - 440 K/uL  Vancomycin, trough  Result Value Ref Range   Vancomycin Tr 12 (L) 15 - 20 ug/mL  Sodium  Result Value Ref Range   Sodium 136 135 - 145 mmol/L  CBC  Result Value Ref Range   WBC 7.7 3.8 - 10.6 K/uL   RBC 4.03 (L) 4.40 - 5.90 MIL/uL   Hemoglobin 12.0 (L) 13.0 - 18.0 g/dL   HCT 35.9 (L) 40.0 - 52.0 %   MCV 89.0 80.0 - 100.0 fL   MCH 29.9 26.0 - 34.0 pg   MCHC 33.6 32.0 - 36.0 g/dL   RDW 16.2 (H) 11.5 - 14.5 %   Platelets 184 150 - 440 K/uL  Creatinine, serum  Result Value Ref Range   Creatinine, Ser 0.75 0.61 - 1.24 mg/dL   GFR calc non Af Amer >60 >60 mL/min   GFR calc Af Amer >60 >60 mL/min    Assessment & Plan:    1. Recurrent UTI Explained to the  patient and daughter that his most recent urine culture did not have significant growth Continue methenamine for urinary acidification and probiotic Reviewed  importance of antibiotic stewardship and only treating ONLY if symptomatic. Return to our office if has signs or symptoms of infection for UA/ UCx (catheterized)  2. Urinary retention Increase CIC to tid   3. History of urethral stricture As above, CIC to keep urethral patient  Patient caths himself three times daily, indefinitely due to urinary retention.    Return for keep appointment on 03/26/2016.  Zara Council, [facility] [address] [place], [place] [address] [phone]

## 2016-01-24 ENCOUNTER — Ambulatory Visit: Payer: Medicare Other | Admitting: Urology

## 2016-01-25 ENCOUNTER — Telehealth: Payer: Self-pay

## 2016-01-25 NOTE — Telephone Encounter (Signed)
Pt daughter in law called stating pt is having right very mild testicular pain. Reinforced with DIL to monitor s/s and can take tylenol/ibuprofen. Pt denied n/v, f/c, dysuria, blood upon CIC, or discomfort more than normal with CIC. Made DIL aware someone will be in the office on Tuesday if s/s do not improve. DIL voiced understanding.

## 2016-03-02 ENCOUNTER — Encounter: Payer: Self-pay | Admitting: Emergency Medicine

## 2016-03-02 ENCOUNTER — Emergency Department: Payer: Medicare Other

## 2016-03-02 ENCOUNTER — Inpatient Hospital Stay
Admission: EM | Admit: 2016-03-02 | Discharge: 2016-03-05 | DRG: 871 | Disposition: A | Discharge status: Home Health | Payer: Medicare Other | Attending: Internal Medicine | Admitting: Internal Medicine

## 2016-03-02 DIAGNOSIS — Z9981 Dependence on supplemental oxygen: Secondary | ICD-10-CM

## 2016-03-02 DIAGNOSIS — I110 Hypertensive heart disease with heart failure: Secondary | ICD-10-CM | POA: Diagnosis present

## 2016-03-02 DIAGNOSIS — R627 Adult failure to thrive: Secondary | ICD-10-CM | POA: Diagnosis present

## 2016-03-02 DIAGNOSIS — R6521 Severe sepsis with septic shock: Secondary | ICD-10-CM | POA: Diagnosis present

## 2016-03-02 DIAGNOSIS — N3000 Acute cystitis without hematuria: Secondary | ICD-10-CM | POA: Diagnosis present

## 2016-03-02 DIAGNOSIS — I429 Cardiomyopathy, unspecified: Secondary | ICD-10-CM | POA: Diagnosis present

## 2016-03-02 DIAGNOSIS — Z993 Dependence on wheelchair: Secondary | ICD-10-CM

## 2016-03-02 DIAGNOSIS — I255 Ischemic cardiomyopathy: Secondary | ICD-10-CM | POA: Diagnosis present

## 2016-03-02 DIAGNOSIS — I5032 Chronic diastolic (congestive) heart failure: Secondary | ICD-10-CM | POA: Diagnosis present

## 2016-03-02 DIAGNOSIS — E039 Hypothyroidism, unspecified: Secondary | ICD-10-CM | POA: Diagnosis present

## 2016-03-02 DIAGNOSIS — I951 Orthostatic hypotension: Secondary | ICD-10-CM | POA: Diagnosis present

## 2016-03-02 DIAGNOSIS — R262 Difficulty in walking, not elsewhere classified: Secondary | ICD-10-CM

## 2016-03-02 DIAGNOSIS — I959 Hypotension, unspecified: Secondary | ICD-10-CM | POA: Diagnosis not present

## 2016-03-02 DIAGNOSIS — J8410 Pulmonary fibrosis, unspecified: Secondary | ICD-10-CM | POA: Diagnosis present

## 2016-03-02 DIAGNOSIS — D638 Anemia in other chronic diseases classified elsewhere: Secondary | ICD-10-CM | POA: Diagnosis present

## 2016-03-02 DIAGNOSIS — E46 Unspecified protein-calorie malnutrition: Secondary | ICD-10-CM | POA: Diagnosis not present

## 2016-03-02 DIAGNOSIS — E872 Acidosis: Secondary | ICD-10-CM | POA: Diagnosis present

## 2016-03-02 DIAGNOSIS — E785 Hyperlipidemia, unspecified: Secondary | ICD-10-CM | POA: Diagnosis present

## 2016-03-02 DIAGNOSIS — E7800 Pure hypercholesterolemia, unspecified: Secondary | ICD-10-CM | POA: Diagnosis present

## 2016-03-02 DIAGNOSIS — J9621 Acute and chronic respiratory failure with hypoxia: Secondary | ICD-10-CM | POA: Diagnosis present

## 2016-03-02 DIAGNOSIS — M6281 Muscle weakness (generalized): Secondary | ICD-10-CM

## 2016-03-02 DIAGNOSIS — Z66 Do not resuscitate: Secondary | ICD-10-CM | POA: Diagnosis present

## 2016-03-02 DIAGNOSIS — I482 Chronic atrial fibrillation: Secondary | ICD-10-CM | POA: Diagnosis present

## 2016-03-02 DIAGNOSIS — A419 Sepsis, unspecified organism: Principal | ICD-10-CM | POA: Diagnosis present

## 2016-03-02 DIAGNOSIS — J701 Chronic and other pulmonary manifestations due to radiation: Secondary | ICD-10-CM | POA: Diagnosis present

## 2016-03-02 DIAGNOSIS — Z87891 Personal history of nicotine dependence: Secondary | ICD-10-CM

## 2016-03-02 DIAGNOSIS — N400 Enlarged prostate without lower urinary tract symptoms: Secondary | ICD-10-CM | POA: Diagnosis present

## 2016-03-02 DIAGNOSIS — Z881 Allergy status to other antibiotic agents status: Secondary | ICD-10-CM | POA: Diagnosis not present

## 2016-03-02 DIAGNOSIS — K219 Gastro-esophageal reflux disease without esophagitis: Secondary | ICD-10-CM | POA: Diagnosis present

## 2016-03-02 DIAGNOSIS — Z880 Allergy status to penicillin: Secondary | ICD-10-CM

## 2016-03-02 DIAGNOSIS — Z95810 Presence of automatic (implantable) cardiac defibrillator: Secondary | ICD-10-CM

## 2016-03-02 DIAGNOSIS — R109 Unspecified abdominal pain: Secondary | ICD-10-CM | POA: Diagnosis present

## 2016-03-02 DIAGNOSIS — R6520 Severe sepsis without septic shock: Secondary | ICD-10-CM | POA: Diagnosis not present

## 2016-03-02 DIAGNOSIS — E119 Type 2 diabetes mellitus without complications: Secondary | ICD-10-CM | POA: Diagnosis present

## 2016-03-02 DIAGNOSIS — J189 Pneumonia, unspecified organism: Secondary | ICD-10-CM | POA: Diagnosis present

## 2016-03-02 DIAGNOSIS — Z8249 Family history of ischemic heart disease and other diseases of the circulatory system: Secondary | ICD-10-CM

## 2016-03-02 DIAGNOSIS — Z79899 Other long term (current) drug therapy: Secondary | ICD-10-CM

## 2016-03-02 DIAGNOSIS — Z85118 Personal history of other malignant neoplasm of bronchus and lung: Secondary | ICD-10-CM

## 2016-03-02 DIAGNOSIS — Z7982 Long term (current) use of aspirin: Secondary | ICD-10-CM

## 2016-03-02 LAB — COMPREHENSIVE METABOLIC PANEL
ALK PHOS: 78 U/L (ref 38–126)
ALT: 11 U/L — ABNORMAL LOW (ref 17–63)
ANION GAP: 15 (ref 5–15)
AST: 30 U/L (ref 15–41)
Albumin: 3.9 g/dL (ref 3.5–5.0)
BUN: 25 mg/dL — ABNORMAL HIGH (ref 6–20)
CALCIUM: 9 mg/dL (ref 8.9–10.3)
CO2: 22 mmol/L (ref 22–32)
CREATININE: 1.17 mg/dL (ref 0.61–1.24)
Chloride: 102 mmol/L (ref 101–111)
GFR, EST NON AFRICAN AMERICAN: 57 mL/min — AB (ref >60)
Glucose, Bld: 163 mg/dL — ABNORMAL HIGH (ref 65–99)
Potassium: 3.8 mmol/L (ref 3.5–5.1)
Sodium: 139 mmol/L (ref 135–145)
TOTAL PROTEIN: 8.1 g/dL (ref 6.5–8.1)
Total Bilirubin: 0.7 mg/dL (ref 0.3–1.2)

## 2016-03-02 LAB — CBC WITH DIFFERENTIAL/PLATELET
BASOS ABS: 0.1 10*3/uL (ref 0–0.1)
Basophils Relative: 0 %
EOS ABS: 0.5 10*3/uL (ref 0–0.7)
EOS PCT: 3 %
HCT: 39.9 % — ABNORMAL LOW (ref 40.0–52.0)
Hemoglobin: 12.8 g/dL — ABNORMAL LOW (ref 13.0–18.0)
Lymphocytes Relative: 12 %
Lymphs Abs: 2.1 10*3/uL (ref 1.0–3.6)
MCH: 29.4 pg (ref 26.0–34.0)
MCHC: 32.2 g/dL (ref 32.0–36.0)
MCV: 91.4 fL (ref 80.0–100.0)
Monocytes Absolute: 1.1 10*3/uL — ABNORMAL HIGH (ref 0.2–1.0)
Monocytes Relative: 6 %
NEUTROS PCT: 79 %
Neutro Abs: 13.4 10*3/uL — ABNORMAL HIGH (ref 1.4–6.5)
PLATELETS: 236 10*3/uL (ref 150–440)
RBC: 4.36 MIL/uL — AB (ref 4.40–5.90)
RDW: 18 % — ABNORMAL HIGH (ref 11.5–14.5)
WBC: 17.1 10*3/uL — AB (ref 3.8–10.6)

## 2016-03-02 LAB — TROPONIN I

## 2016-03-02 LAB — INFLUENZA PANEL BY PCR (TYPE A & B)
INFLAPCR: NEGATIVE
Influenza B By PCR: NEGATIVE

## 2016-03-02 LAB — LACTIC ACID, PLASMA: Lactic Acid, Venous: 8.6 mmol/L (ref 0.5–1.9)

## 2016-03-02 LAB — URINALYSIS, COMPLETE (UACMP) WITH MICROSCOPIC
BILIRUBIN URINE: NEGATIVE
Bacteria, UA: NONE SEEN
GLUCOSE, UA: NEGATIVE mg/dL
HGB URINE DIPSTICK: NEGATIVE
Ketones, ur: NEGATIVE mg/dL
NITRITE: NEGATIVE
Protein, ur: NEGATIVE mg/dL
SPECIFIC GRAVITY, URINE: 1.028 (ref 1.005–1.030)
pH: 5 (ref 5.0–8.0)

## 2016-03-02 LAB — GLUCOSE, CAPILLARY: GLUCOSE-CAPILLARY: 118 mg/dL — AB (ref 65–99)

## 2016-03-02 MED ORDER — SODIUM CHLORIDE 0.9 % IV BOLUS (SEPSIS)
1000.0000 mL | Freq: Once | INTRAVENOUS | Status: AC
  Administered 2016-03-02: 1000 mL via INTRAVENOUS

## 2016-03-02 MED ORDER — MIDODRINE HCL 5 MG PO TABS
5.0000 mg | ORAL_TABLET | Freq: Three times a day (TID) | ORAL | Status: DC
  Administered 2016-03-03 – 2016-03-05 (×8): 5 mg via ORAL
  Filled 2016-03-02 (×9): qty 1

## 2016-03-02 MED ORDER — MEROPENEM-SODIUM CHLORIDE 1 GM/50ML IV SOLR
1.0000 g | Freq: Once | INTRAVENOUS | Status: AC
  Administered 2016-03-02: 1 g via INTRAVENOUS
  Filled 2016-03-02: qty 50

## 2016-03-02 MED ORDER — SIMVASTATIN 20 MG PO TABS
20.0000 mg | ORAL_TABLET | Freq: Every day | ORAL | Status: DC
  Administered 2016-03-03 – 2016-03-04 (×2): 20 mg via ORAL
  Filled 2016-03-02 (×2): qty 1

## 2016-03-02 MED ORDER — MAGNESIUM OXIDE 400 (241.3 MG) MG PO TABS
400.0000 mg | ORAL_TABLET | Freq: Every day | ORAL | Status: DC
Start: 2016-03-03 — End: 2016-03-05
  Administered 2016-03-03 – 2016-03-05 (×3): 400 mg via ORAL
  Filled 2016-03-02 (×3): qty 1

## 2016-03-02 MED ORDER — SODIUM CHLORIDE 0.9 % IV SOLN: 1.0000 g | Freq: Once | INTRAVENOUS | Status: DC

## 2016-03-02 MED ORDER — DIGOXIN 250 MCG PO TABS
250.0000 ug | ORAL_TABLET | Freq: Every day | ORAL | Status: DC
  Administered 2016-03-03 – 2016-03-05 (×3): 250 ug via ORAL
  Filled 2016-03-02 (×2): qty 1
  Filled 2016-03-02: qty 2

## 2016-03-02 MED ORDER — MEROPENEM-SODIUM CHLORIDE 500 MG/50ML IV SOLR
INTRAVENOUS | Status: AC
  Filled 2016-03-02: qty 50

## 2016-03-02 MED ORDER — ONDANSETRON HCL 4 MG/2ML IJ SOLN
4.0000 mg | Freq: Once | INTRAMUSCULAR | Status: AC
  Administered 2016-03-02: 4 mg via INTRAVENOUS
  Filled 2016-03-02: qty 2

## 2016-03-02 MED ORDER — LEVOTHYROXINE SODIUM 50 MCG PO TABS
50.0000 ug | ORAL_TABLET | Freq: Every day | ORAL | Status: DC
  Administered 2016-03-03 – 2016-03-05 (×3): 50 ug via ORAL
  Filled 2016-03-02 (×3): qty 1

## 2016-03-02 MED ORDER — ACETAMINOPHEN 500 MG PO TABS
ORAL_TABLET | ORAL | Status: AC
  Filled 2016-03-02: qty 2

## 2016-03-02 MED ORDER — PANTOPRAZOLE SODIUM 40 MG PO TBEC
40.0000 mg | DELAYED_RELEASE_TABLET | Freq: Every day | ORAL | Status: DC
  Administered 2016-03-03 – 2016-03-05 (×3): 40 mg via ORAL
  Filled 2016-03-02 (×3): qty 1

## 2016-03-02 MED ORDER — HEPARIN SODIUM (PORCINE) 5000 UNIT/ML IJ SOLN: 5000.0000 [IU] | Freq: Three times a day (TID) | INTRAMUSCULAR | Status: DC

## 2016-03-02 MED ORDER — VANCOMYCIN HCL IN DEXTROSE 1-5 GM/200ML-% IV SOLN
1000.0000 mg | Freq: Once | INTRAVENOUS | Status: AC
  Administered 2016-03-02: 1000 mg via INTRAVENOUS
  Filled 2016-03-02: qty 200

## 2016-03-02 MED ORDER — ONDANSETRON HCL 4 MG/2ML IJ SOLN: 4.0000 mg | Freq: Four times a day (QID) | INTRAMUSCULAR | Status: DC | PRN

## 2016-03-02 MED ORDER — ACETAMINOPHEN 500 MG PO TABS
1000.0000 mg | ORAL_TABLET | Freq: Once | ORAL | Status: AC
  Administered 2016-03-02: 1000 mg via ORAL

## 2016-03-02 MED ORDER — SODIUM CHLORIDE 0.9 % IV SOLN: 250.0000 mL | INTRAVENOUS | Status: DC | PRN

## 2016-03-02 MED ORDER — NOREPINEPHRINE BITARTRATE 1 MG/ML IV SOLN
2.0000 ug/min | Freq: Once | INTRAVENOUS | Status: AC
  Administered 2016-03-02: 2 ug/min via INTRAVENOUS
  Filled 2016-03-02: qty 4

## 2016-03-02 MED ORDER — HYDROCORTISONE 10 MG PO TABS
10.0000 mg | ORAL_TABLET | Freq: Two times a day (BID) | ORAL | Status: DC
  Administered 2016-03-03 – 2016-03-05 (×6): 10 mg via ORAL
  Filled 2016-03-02 (×7): qty 1

## 2016-03-02 MED ORDER — GABAPENTIN 600 MG PO TABS
600.0000 mg | ORAL_TABLET | Freq: Two times a day (BID) | ORAL | Status: DC
  Administered 2016-03-03 – 2016-03-05 (×6): 600 mg via ORAL
  Filled 2016-03-02 (×7): qty 1

## 2016-03-02 MED ORDER — SODIUM CHLORIDE 0.9 % IV SOLN
INTRAVENOUS | Status: DC
  Administered 2016-03-03: 01:00:00 via INTRAVENOUS

## 2016-03-02 MED ORDER — SODIUM CHLORIDE 0.9 % IV BOLUS (SEPSIS)
500.0000 mL | Freq: Once | INTRAVENOUS | Status: DC
  Administered 2016-03-02: 500 mL via INTRAVENOUS

## 2016-03-02 MED ORDER — IOPAMIDOL (ISOVUE-370) INJECTION 76%
100.0000 mL | Freq: Once | INTRAVENOUS | Status: AC | PRN
  Administered 2016-03-02: 100 mL via INTRAVENOUS

## 2016-03-02 MED ORDER — HYDROMORPHONE HCL 1 MG/ML IJ SOLN
1.0000 mg | Freq: Once | INTRAMUSCULAR | Status: AC
  Administered 2016-03-02: 1 mg via INTRAVENOUS
  Filled 2016-03-02: qty 1

## 2016-03-02 MED ORDER — ACETAMINOPHEN 325 MG PO TABS: 650.0000 mg | ORAL_TABLET | ORAL | Status: DC | PRN

## 2016-03-02 MED ORDER — PYRIDOSTIGMINE BROMIDE 60 MG PO TABS
60.0000 mg | ORAL_TABLET | Freq: Two times a day (BID) | ORAL | Status: DC
  Administered 2016-03-03 – 2016-03-05 (×6): 60 mg via ORAL
  Filled 2016-03-02 (×7): qty 1

## 2016-03-02 NOTE — H&P (Signed)
PULMONARY / CRITICAL CARE MEDICINE   Name: Russell Howard MRN: [id number] DOB: 1934-10-04    ADMISSION DATE:  03/02/2016   REFERRING MD: 03/02/2016  CHIEF COMPLAINT: Fever, chills, nausea and abdominal pain  HISTORY OF PRESENT ILLNESS:   This is a 82 year old Caucasian male with a past medical history of hypothyroidism, BPH, cardiomyopathy, status post defibrillator placement, hypertension, congestive heart failure, GERD, atrial fibrillation on digoxin, hyperlipidemia and orthostatic hypotension who presented to the ED, left flank and abdominal pain and nausea and vomiting that started this evening. Patient states that he was in his usual state of health and went to church this evening when suddenly he started to shiver, had nausea and had some emesis. Onset of symptoms was sudden and the only associated symptom was chills. Per his daughter, patient had been complaining that he hasn't been feeling well for the last couple of days. He denies any sick contacts. He denies chest pain, palpitations, dizziness and headaches. At the ED, she was severely hypotensive with blood pressure of 90/35 with a mean arterial place pressure of 50. His CT abdomen shows severe mural thickening at the anterior aspect of the urinary Bladder. He has a history of urinary retention but denies any symptoms currently He was hospitalized in December for hypoxemic respiratory failure, nausea, weakness and right loculated pleural effusion. He had a thoracentesis done during that admission and his symptoms improved. Wilber Bihari is nonambulatory and wheelchair bound  PAST MEDICAL HISTORY :  He  has a past medical history of Acquired hypothyroidism (11/02/2014); BPH (benign prostatic hyperplasia); Cardiac defibrillator in place (11/02/2014); Cardiomyopathy [facility]); CHF (congestive heart failure) ([place]); Chronic diastolic heart failure ([place]) (11/02/2014); Gastro-esophageal reflux disease without esophagitis (11/02/2014); H/O malignant neoplasm  (11/09/2014); Heart valve disease (11/02/2014); History of atrial fibrillation (11/02/2014); Lung cancer ([place]); Neuropathy ([place]) (11/02/2014); Orthostasis (11/02/2014); Pure hypercholesterolemia (11/02/2014); and Valvular heart disease.  PAST SURGICAL HISTORY: He  has a past surgical history that includes Appendectomy; DUAL ICD IMPLANT (2007/2009); and Port-a-cath removal.  Allergies  Allergen Reactions  . Penicillin G Hives    Has patient had a PCN reaction causing immediate rash, facial/tongue/throat swelling, SOB or lightheadedness with hypotension: Yes Has patient had a PCN reaction causing severe rash involving mucus membranes or skin necrosis: No Has patient had a PCN reaction that required hospitalization No Has patient had a PCN reaction occurring within the last 10 years: No If all of the above answers are "NO", then may proceed with Cephalosporin use.  . Sulfa Antibiotics Rash    No current facility-administered medications on file prior to encounter.    Current Outpatient Prescriptions on File Prior to Encounter  Medication Sig  . acidophilus (RISAQUAD) CAPS capsule Take 1 capsule by mouth daily.  . Ascorbic Acid (VITAMIN C) 1000 MG tablet Take 500 mg by mouth 3 (three) times daily. Reported on 06/14/2015  . aspirin EC 81 MG tablet Take 81 mg by mouth daily.   . Cholecalciferol 5000 units TABS Take 5,000 Units by mouth every morning.  . Coenzyme Q10 (TH CO Q-10) 100 MG capsule Take 100 mg by mouth daily.   Marland Kitchen DIGOX 250 MCG tablet Take 250 mcg by mouth daily.  . furosemide (LASIX) 20 MG tablet Take 20 mg by mouth every other day as needed (as needed for water retention).   . hydrocortisone (CORTEF) 10 MG tablet Take 10 mg by mouth 2 (two) times daily.   Marland Kitchen levothyroxine (SYNTHROID, LEVOTHROID) 50 MCG tablet Take 50 mcg by mouth daily before breakfast.  .  MAGNESIUM-OXIDE 400 (241.3 Mg) MG tablet Take 400 mg by mouth daily.  . methenamine (HIPREX) 1 g tablet TAKE 1 TABLET(1 GRAM) BY  MOUTH TWICE DAILY WITH A MEAL  . midodrine (PROAMATINE) 5 MG tablet Take 5 mg by mouth 3 (three) times daily with meals.  . Multiple Vitamin (MULTIVITAMIN WITH MINERALS) TABS tablet Take 1 tablet by mouth daily.  . pantoprazole (PROTONIX) 40 MG tablet Take 40 mg by mouth daily before breakfast.   . potassium chloride (MICRO-K) 10 MEQ CR capsule Take 10 mEq by mouth daily as needed (only with lasix).   Marland Kitchen pyridostigmine (MESTINON) 60 MG tablet Take 60 mg by mouth 2 (two) times daily.   . SAW PALMETTO-PUMPKIN SEED OIL PO Take 1 capsule by mouth daily.   . simvastatin (ZOCOR) 20 MG tablet Take 20 mg by mouth daily at 6 PM.   . vitamin B-12 (CYANOCOBALAMIN) 500 MCG tablet Take 500 mcg by mouth daily.   . vitamin E 400 UNIT capsule Take 400 Units by mouth daily.   . chlorpheniramine-HYDROcodone (TUSSIONEX) 10-8 MG/5ML SUER Take 5 mLs by mouth every 4 (four) hours as needed for cough. (Patient not taking: Reported on 03/02/2016)    FAMILY HISTORY:  Family History  Problem Relation Age of Onset  . Hypertension    . Heart disease     SOCIAL HISTORY: He  reports that he quit smoking about 34 years ago. His smoking use included Cigarettes. He has never used smokeless tobacco. He reports that he does not drink alcohol or use drugs.  REVIEW OF SYSTEMS:   Constitutional: Negative for fever and chills. Reports generalized malaise.  HENT: Negative for congestion and rhinorrhea.  Eyes: Negative for redness and visual disturbance.  Respiratory: Positive for shortness of breath, cough that is minimally productive, but negative for wheezing.  Cardiovascular: Negative for chest pain and palpitations but positive for dyspnea on exertion and dyspnea.  Gastrointestinal: Positive  for nausea , vomiting, anorexia and  abdominal pain. Negative for loose stools Genitourinary: Negative for dysuria and urgency.  Endocrine: Denies polyuria, polyphagia and heat intolerance Musculoskeletal: Lower extremity weakness,  nonambulatory.  Skin: Negative for pallor and wound.  Neurological: Negative for dizziness and headaches   SUBJECTIVE:    VITAL SIGNS: BP (!) 96/56   Pulse 93   Temp 98.4 F (36.9 C) (Axillary)   Resp 12   Ht '5\' 10"'$  (1.778 m)   Wt 81.9 kg (180 lb 8.9 oz)   SpO2 96%   BMI 25.91 kg/m   HEMODYNAMICS:    VENTILATOR SETTINGS:    INTAKE / OUTPUT: No intake/output data recorded.  PHYSICAL EXAMINATION: General: Acutely ill-looking male, in no distress Neuro: Alert and oriented to person, place and time, speech is normal, grip strength in upper extremities appear normal, diminished muscle tone and strength in lower extremities HEENT: [place]/AT, PERRLA, trachea midline, oral mucosa dry Cardiovascular:  Apical pulse tachycardic, S1, S2, no murmur, regurg or gallop Lungs:  Diminished breath sounds in all lung fields, right greater than left, bibasilar crackles, right greater than left Abdomen:  Soft, nontender, nondistended, positive bowel sounds, palpation reveals no organomegaly Musculoskeletal:  No visible joint deformities in upper extremities, lower extremities with minimal tone and strength, no joint swelling Extremities: +2 pitting edema, diminished peripheral pulses in lower extremities Skin: Warm and Dry  LABS:  BMET  Recent Labs Lab 03/02/16 2000 03/03/16 0401  NA 139 136  K 3.8 3.9  CL 102 103  CO2 22 27  BUN  25* 21*  CREATININE 1.17 1.00  GLUCOSE 163* 130*    Electrolytes  Recent Labs Lab 03/02/16 2000 03/03/16 0401  CALCIUM 9.0 8.0*  MG  --  1.5*  PHOS  --  2.9    CBC  Recent Labs Lab 03/02/16 2000 03/03/16 0401  WBC 17.1* 17.6*  HGB 12.8* 11.4*  HCT 39.9* 33.8*  PLT 236 159    Coag's No results for input(s): APTT, INR in the last 168 hours.  Sepsis Markers  Recent Labs Lab 03/02/16 2000 03/02/16 2342 03/03/16 0401  LATICACIDVEN 8.6* 2.4* 1.7  PROCALCITON  --  0.48  --     ABG No results for input(s): PHART, PCO2ART, PO2ART in  the last 168 hours.  Liver Enzymes  Recent Labs Lab 03/02/16 2000  AST 30  ALT 11*  ALKPHOS 78  BILITOT 0.7  ALBUMIN 3.9    Cardiac Enzymes  Recent Labs Lab 03/02/16 2000 03/02/16 2342 03/03/16 0401  TROPONINI <0.03 0.03* 0.04*    Glucose  Recent Labs Lab 03/02/16 2346  GLUCAP 118*    Imaging Dg Chest Port 1 View  Result Date: 03/02/2016 CLINICAL DATA:  Right-sided central line. EXAM: PORTABLE CHEST 1 VIEW COMPARISON:  03/02/2016 at 20:04 FINDINGS: New right subclavian central line reaches the central circulation, extending into the low SVC and then curving back on itself with the tip at approximately the level of the azygos vein junction. No pneumothorax. No other interval change. Persistent right lung consolidation and right hemidiaphragm elevation. Persistent diffuse interstitial coarsening. IMPRESSION: The new right-sided central line reaches the central circulation but it is curved back on itself as detailed above. No pneumothorax. Electronically Signed   By: Andreas Newport M.D.   On: 03/02/2016 22:25   Dg Chest Port 1 View  Result Date: 03/02/2016 CLINICAL DATA:  Shortness of breath and vomiting. EXAM: PORTABLE CHEST 1 VIEW COMPARISON:  01/09/2016 and prior chest radiographs.  11/10/2015 CT FINDINGS: Cardiomegaly and cardiac valve replacement changes noted. A left-sided pacemaker/ ICD again noted. Right hemithorax volume loss, loculated right pleural effusion and right lung consolidation/airspace disease has not significantly changed. Mild left lung interstitial prominence again noted. There is no evidence of pneumothorax or left pleural effusion. No acute bony abnormalities are noted. IMPRESSION: No evidence of acute abnormality. Unchanged right hemithorax volume loss, loculated effusion and right lung consolidation/ airspace disease. Electronically Signed   By: Margarette Canada M.D.   On: 03/02/2016 20:37   Ct Angio Abd/pel W/ And/or W/o  Result Date:  03/02/2016 CLINICAL DATA:  Left lower abdominal pain, onset at 18:30 tonight. EXAM: CTA ABDOMEN AND PELVIS wITHOUT AND WITH CONTRAST TECHNIQUE: Multidetector CT imaging of the abdomen and pelvis was performed using the standard protocol during bolus administration of intravenous contrast. Multiplanar reconstructed images and MIPs were obtained and reviewed to evaluate the vascular anatomy. CONTRAST:  100 mL Isovue 370 intravenous COMPARISON:  01/13/2016 FINDINGS: VASCULAR Aorta: The abdominal aorta is normal in caliber with extensive atherosclerotic calcification. No dissection or significant stenosis. Celiac: Heavily calcified at its origin but patent.  No aneurysm. SMA: Heavily calcified at its origin and probably stenotic. Cannot exclude a short focal occlusion within the heavily calcified proximal SMA. No aneurysm. Renals: Heavily calcified at their origins, possibly stenotic. No aneurysm or occlusion. IMA: Patent and normal in caliber. Inflow: Heavily calcified but normal in caliber and patent. Proximal Outflow: Extensive atherosclerotic calcification without aneurysm or occlusion. Veins: No obvious venous abnormality within the limitations of this arterial phase study. Review  of the MIP images confirms the above findings. NON-VASCULAR Lower chest: Unchanged loculated right pleural collection. Unchanged fibrotic appearing interstitial coarsening in the bases. Hepatobiliary: No focal liver lesion. There is a 12 mm calculus in the gallbladder, unchanged. No gallbladder inflammation or bile duct dilatation. Pancreas: Unremarkable. No pancreatic ductal dilatation or surrounding inflammatory changes. Spleen: Normal in size without focal abnormality. Adrenals/Urinary Tract: Both adrenals are normal. Unchanged low-attenuation right renal lesions, probably cysts but too small for definitive characterization. Unremarkable collecting systems and ureters bilaterally. There is very prominent mural thickening at the  anterior aspect of the urinary bladder, and this has worsened from 01/13/2016. Cannot exclude neoplasm, but the rapid worsening of mural thickening is more consistent with infection or inflammation. Stomach/Bowel: Hiatal hernia. Small bowel is unremarkable. Colon is unremarkable. No acute bowel inflammation. No bowel obstruction or perforation. Uncomplicated mild colonic diverticulosis. Lymphatic: No adenopathy in the abdomen or pelvis. Reproductive: Unremarkable Other: No focal inflammation.  No ascites. Musculoskeletal: No significant skeletal lesion. IMPRESSION: VASCULAR Diffuse atherosclerotic calcification of the aorta and its major branches. The SMA origin is probably stenotic, and a very short focal occlusion cannot be entirely excluded although this does not appear significantly different from 01/13/2016. No acute vascular abnormality is evident. NON-VASCULAR 1. Marked mural thickening at the anterior aspect of the urinary bladder, worsened from 01/13/2016. Inflammation or infection is more likely than neoplasm given the rapid changes. 2. Cholelithiasis. 3. Hiatal hernia. 4. Uncomplicated mild colonic diverticulosis. Electronically Signed   By: Andreas Newport M.D.   On: 03/02/2016 21:59    STUDIES:  None  CULTURES: Blood cultures 2. 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 Nebulized bronchodilators and steroids as needed  Chest x-ray daily when necessary Empiric antibiotics. Consider sputum culture if cough worsens IV fluids  CARDIOVASCULAR A:  Septic shock. Ischemic cardiomyopathy status post AICD/defibrillator. History of hypertension History of hyperlipidemia Atrial fibrillation on anticoagulation Congestive heart failure P:  Hemodynamics monitoring per ICU protocol Gentle IV fluids resuscitation given patient's history of congestive heart failure  Hold all antihypertensives in light septic shock Norepinephrine infusion, titrate to maintain mean arterial blood pressure greater than 65  RENAL A:   No acute issues P:   Monitor and correct electrolyte imbalances  GASTROINTESTINAL A:   Anorexia Protein calorie malnutrition secondary to chronic illness P:   Oral intake as tolerated Continue supplements  HEMATOLOGIC A:   Anemia of chronic disease-hemoglobin is stable P:  Monitor hemoglobin and hematocrit and transfuse as needed  INFECTIOUS A:   Acute Cystitis Septic shock P:   Follow-up blood cultures Broad-spectrum antibiotics as above Monitor fever curve  ENDOCRINE A:   Acute issues P:   Monitor blood glucose with BMP  NEUROLOGIC A:   no acute issues P:   RASS goal: n/a 18. Her mental status post  Genitourinary A:   Acute cystitis. History of urinary retention. BPH P:   Continue IV antibiotics. Foley catheter with strict I and O monitoring Continue home medications for BPH  FAMILY  -  Updates: Patient's daughter updated at bedside on current treatment plan. All questions answered.  - Inter-disciplinary family meet or Palliative Care meeting due by:  day 7  Plan of care discussed with Dr. Mortimer Fries and Margaree Mackintosh physician Total critical care time equals 60 minutes  [facility] ANP-BC Pulmonary and Critical Care Medicine [facility] Pager [phone] or  [phone]  03/03/2016, 6:58 AM  See my note for 01/29  Merton Border, MD PCCM service Mobile [phone] Pager [phone] 03/03/2016

## 2016-03-02 NOTE — ED Triage Notes (Signed)
Patient with left lower abdominal pain radiating to back and vomiting that started about 18:30 tonight.

## 2016-03-02 NOTE — ED Provider Notes (Signed)
Time Seen: Approximately 1943  I have reviewed the triage notes  Chief Complaint: Emesis and Abdominal Pain   History of Present Illness: Russell Howard is a 82 y.o. male *who presents with hypotension and right-sided abdominal pain radiating toward the back. Symptoms started suddenly tonight with some persistent nausea and vomiting and arrives with a heart rate of 119 and a blood pressure of 79/42. Patient has a history of numerous medical problems including pacemaker with a history of poor ejection fraction, right-sided scar tissue throughout his lung fields. He has history of diabetes without complication and acquired hypothyroidism etc. The patient himself is a poor historian and most of this history and review of systems was acquired from family members. His daughter states he's complained of not "" feeling well for last couple of days "". Not nothing more specific than that does seem that the pain and started recently was a new finding. She states she has had an episode of sepsis before and does self catheterizations at home. He apparently self catheterized himself not too long ago. No other issues such as chest pain or productive cough or any other concerns   Past Medical History:  Diagnosis Date  . Acquired hypothyroidism 11/02/2014  . BPH (benign prostatic hyperplasia)   . Cardiac defibrillator in place 11/02/2014  . Cardiomyopathy ([place])   . CHF (congestive heart failure) ([place])   . Chronic diastolic heart failure ([place]) 11/02/2014  . Gastro-esophageal reflux disease without esophagitis 11/02/2014  . H/O malignant neoplasm 11/09/2014  . Heart valve disease 11/02/2014  . History of atrial fibrillation 11/02/2014  . Lung cancer ([place])   . Neuropathy ([place]) 11/02/2014  . Orthostasis 11/02/2014  . Pure hypercholesterolemia 11/02/2014  . Valvular heart disease     Patient Active Problem List   Diagnosis Date Noted  . Septic shock ([place]) 03/02/2016  . Hydronephrosis, right 01/14/2016  .  Constipation 01/13/2016  . Urinary retention 01/13/2016  . Lower abdominal pain 01/13/2016  . Bacteremia 01/13/2016  . Hypotension 01/13/2016  . Palliative care encounter   . Goals of care, counseling/discussion   . Cough   . Encounter for hospice care discussion   . Hypoxia 01/09/2016  . Primary cancer of bronchus of right lower lobe ([place]) 11/29/2015  . Sepsis ([place]) 06/14/2015  . Lower urinary tract infectious disease 06/14/2015    Class: Acute  . H/O malignant neoplasm 11/09/2014  . Acquired hypothyroidism 11/02/2014  . Cardiomyopathy ([place]) 11/02/2014  . Chronic diastolic heart failure ([place]) 11/02/2014  . Diabetes mellitus without complication ([place]) 73/42/8768  . Cardiac defibrillator in place 11/02/2014  . Gastro-esophageal reflux disease without esophagitis 11/02/2014  . History of atrial fibrillation 11/02/2014  . BP (high blood pressure) 11/02/2014  . Neuropathy ([place]) 11/02/2014  . Orthostasis 11/02/2014  . Pure hypercholesterolemia 11/02/2014  . Heart valve disease 11/02/2014    Past Surgical History:  Procedure Laterality Date  . APPENDECTOMY    . DUAL ICD IMPLANT  2007/2009   implantable cardiac defibrillator  . PORT-A-CATH REMOVAL      Past Surgical History:  Procedure Laterality Date  . APPENDECTOMY    . DUAL ICD IMPLANT  2007/2009   implantable cardiac defibrillator  . PORT-A-CATH REMOVAL      Current Outpatient Rx  . Order #: [id number] Class: Historical Med  . Order #: [id number] Class: Historical Med  . Order #: [id number] Class: Historical Med  . Order #: [id number] Class: Historical Med  . Order #: [id number] Class: Historical Med  . Order #: [id number] Class: Historical Med  .  Order #: [id number] Class: Historical Med  . Order #: [id number] Class: Historical Med  . Order #: [id number] Class: Historical Med  . Order #: [id number] Class: Historical Med  . Order #: [id number] Class: Historical Med  . Order #: [id number] Class: Fax  . Order #: [id number] Class:  Historical Med  . Order #: [id number] Class: Historical Med  . Order #: [id number] Class: Historical Med  . Order #: [id number] Class: Historical Med  . Order #: [id number] Class: Historical Med  . Order #: [id number] Class: Historical Med  . Order #: [id number] Class: Historical Med  . Order #: [id number] Class: Historical Med  . Order #: [id number] Class: Historical Med  . Order #: [id number] Class: Normal    Allergies:  Penicillin g and Sulfa antibiotics  Family History: Family History  Problem Relation Age of Onset  . Hypertension    . Heart disease      Social History: Social History  Substance Use Topics  . Smoking status: Former Smoker    Types: Cigarettes    Quit date: 11/21/1981  . Smokeless tobacco: Never Used  . Alcohol use No     Review of Systems:   10 point review of systems was performed and was otherwise negative:  Constitutional: No feverIdentified prior to arrival Eyes: No visual disturbances ENT: No sore throat, ear pain Cardiac: No chest pain Respiratory: No new shortness of breath, wheezing, or stridor Abdomen: Patient points primarily to the center of the abdomen and to this historian toward the right flank area. No recent history of diarrhea, melena or hematochezia. Endocrine: No weight loss, No night sweats Extremities: No peripheral edema, cyanosis Skin: No rashes, easy bruising Neurologic: No focal weakness, trouble with speech or swollowing Urologic: Patient self catheterizes himself. He denies any complications   Physical Exam:  ED Triage Vitals  Enc Vitals Group     BP 03/02/16 1937 (!) 79/42     Pulse Rate 03/02/16 1937 (!) 119     Resp 03/02/16 1937 (!) 26     Temp 03/02/16 1937 97.5 F (36.4 C)     Temp Source 03/02/16 1937 Oral     SpO2 03/02/16 1937 (!) 89 %     Weight 03/02/16 1938 172 lb (78 kg)     Height 03/02/16 1938 '5\' 10"'$  (1.778 m)     Head Circumference --      Peak Flow --      Pain Score 03/02/16 1938 10     Pain Loc --       Pain Edu? --      Excl. in [place]? --     General: Awake , Alert , and Oriented times 3; GCS 15 Patient appears uncomfortable lying in the left lateral decubitus position with his legs flexed. Patient is currently having nausea and vomiting Head: Normal cephalic , atraumatic Eyes: Pupils equal , round, reactive to light. Conjunctivae are clear Nose/Throat: No nasal drainage, patent upper airway without erythema or exudate.  Neck: Supple, Full range of motion, No anterior adenopathy or palpable thyroid masses. No meningeal signs Lungs: Decreased breath sounds bilaterally at the bases especially on the right Heart: Tachycardic with pacemaker present and palpable left upper chest wall region  Abdomen: Abdomen shows some diffuse tenderness without rebound guarding or rigidity. No obvious palpable masses though difficult exam .        Extremities: 2 plus symmetric pulses. No edema, clubbing or cyanosis Neurologic: , Motor symmetric without deficits, sensory intact Skin: warm, dry, no rashes   Labs:   All laboratory work was reviewed including any  pertinent negatives or positives listed below:  Labs Reviewed  COMPREHENSIVE METABOLIC PANEL - Abnormal; Notable for the following:       Result Value   Glucose, Bld 163 (*)    BUN 25 (*)    ALT 11 (*)    GFR calc non Af Amer 57 (*)    All other components within normal limits  CBC WITH DIFFERENTIAL/PLATELET - Abnormal; Notable for the following:    WBC 17.1 (*)    RBC 4.36 (*)    Hemoglobin 12.8 (*)    HCT 39.9 (*)    RDW 18.0 (*)    Neutro Abs 13.4 (*)    Monocytes Absolute 1.1 (*)    All other components within normal limits  LACTIC ACID, PLASMA - Abnormal; Notable for the following:    Lactic Acid, Venous 8.6 (*)    All other components within normal limits  CULTURE, BLOOD (ROUTINE X 2)  CULTURE, BLOOD (ROUTINE X 2)  URINE CULTURE  TROPONIN I  INFLUENZA PANEL BY PCR (TYPE A & B)  URINALYSIS, COMPLETE (UACMP) WITH MICROSCOPIC  LACTIC  ACID, PLASMA  TROPONIN I  TROPONIN I  TROPONIN I  LACTIC ACID, PLASMA  LACTIC ACID, PLASMA  PROCALCITONIN  CBC  BASIC METABOLIC PANEL  MAGNESIUM  PHOSPHORUS    EKG:  AV sequential pacemaker ED ECG REPORT I, Daymon Larsen, the attending physician, personally viewed and interpreted this ECG.  Date: 03/02/2016 EKG Time: 2023Rate: 86Rhythm: AV sequential pacemaker QRS Axis: normal Intervals: normal ST/T Wave abnormalities: normal Conduction Disturbances: none Narrative Interpretation: unremarkable   Radiology:   "Dg Chest Port 1 View  Result Date: 03/02/2016 CLINICAL DATA:  Right-sided central line. EXAM: PORTABLE CHEST 1 VIEW COMPARISON:  03/02/2016 at 20:04 FINDINGS: New right subclavian central line reaches the central circulation, extending into the low SVC and then curving back on itself with the tip at approximately the level of the azygos vein junction. No pneumothorax. No other interval change. Persistent right lung consolidation and right hemidiaphragm elevation. Persistent diffuse interstitial coarsening. IMPRESSION: The new right-sided central line reaches the central circulation but it is curved back on itself as detailed above. No pneumothorax. Electronically Signed   By: Andreas Newport M.D.   On: 03/02/2016 22:25   Dg Chest Port 1 View  Result Date: 03/02/2016 CLINICAL DATA:  Shortness of breath and vomiting. EXAM: PORTABLE CHEST 1 VIEW COMPARISON:  01/09/2016 and prior chest radiographs.  11/10/2015 CT FINDINGS: Cardiomegaly and cardiac valve replacement changes noted. A left-sided pacemaker/ ICD again noted. Right hemithorax volume loss, loculated right pleural effusion and right lung consolidation/airspace disease has not significantly changed. Mild left lung interstitial prominence again noted. There is no evidence of pneumothorax or left pleural effusion. No acute bony abnormalities are noted. IMPRESSION: No evidence of acute abnormality. Unchanged right  hemithorax volume loss, loculated effusion and right lung consolidation/ airspace disease. Electronically Signed   By: Margarette Canada M.D.   On: 03/02/2016 20:37   Ct Angio Abd/pel W/ And/or W/o  Result Date: 03/02/2016 CLINICAL DATA:  Left lower abdominal pain, onset at 18:30 tonight. EXAM: CTA ABDOMEN AND PELVIS wITHOUT AND WITH CONTRAST TECHNIQUE: Multidetector CT imaging of the abdomen and pelvis was performed using the standard protocol during bolus administration of intravenous contrast. Multiplanar reconstructed images and MIPs were obtained and reviewed to evaluate the vascular anatomy. CONTRAST:  100 mL Isovue 370 intravenous COMPARISON:  01/13/2016 FINDINGS: VASCULAR Aorta: The abdominal aorta is normal in caliber with extensive atherosclerotic calcification.  No dissection or significant stenosis. Celiac: Heavily calcified at its origin but patent.  No aneurysm. SMA: Heavily calcified at its origin and probably stenotic. Cannot exclude a short focal occlusion within the heavily calcified proximal SMA. No aneurysm. Renals: Heavily calcified at their origins, possibly stenotic. No aneurysm or occlusion. IMA: Patent and normal in caliber. Inflow: Heavily calcified but normal in caliber and patent. Proximal Outflow: Extensive atherosclerotic calcification without aneurysm or occlusion. Veins: No obvious venous abnormality within the limitations of this arterial phase study. Review of the MIP images confirms the above findings. NON-VASCULAR Lower chest: Unchanged loculated right pleural collection. Unchanged fibrotic appearing interstitial coarsening in the bases. Hepatobiliary: No focal liver lesion. There is a 12 mm calculus in the gallbladder, unchanged. No gallbladder inflammation or bile duct dilatation. Pancreas: Unremarkable. No pancreatic ductal dilatation or surrounding inflammatory changes. Spleen: Normal in size without focal abnormality. Adrenals/Urinary Tract: Both adrenals are normal. Unchanged  low-attenuation right renal lesions, probably cysts but too small for definitive characterization. Unremarkable collecting systems and ureters bilaterally. There is very prominent mural thickening at the anterior aspect of the urinary bladder, and this has worsened from 01/13/2016. Cannot exclude neoplasm, but the rapid worsening of mural thickening is more consistent with infection or inflammation. Stomach/Bowel: Hiatal hernia. Small bowel is unremarkable. Colon is unremarkable. No acute bowel inflammation. No bowel obstruction or perforation. Uncomplicated mild colonic diverticulosis. Lymphatic: No adenopathy in the abdomen or pelvis. Reproductive: Unremarkable Other: No focal inflammation.  No ascites. Musculoskeletal: No significant skeletal lesion. IMPRESSION: VASCULAR Diffuse atherosclerotic calcification of the aorta and its major branches. The SMA origin is probably stenotic, and a very short focal occlusion cannot be entirely excluded although this does not appear significantly different from 01/13/2016. No acute vascular abnormality is evident. NON-VASCULAR 1. Marked mural thickening at the anterior aspect of the urinary bladder, worsened from 01/13/2016. Inflammation or infection is more likely than neoplasm given the rapid changes. 2. Cholelithiasis. 3. Hiatal hernia. 4. Uncomplicated mild colonic diverticulosis. Electronically Signed   By: Andreas Newport M.D.   On: 03/02/2016 21:59  "   I personally reviewed the radiologic studies   Procedures: *  Right subclavian central line  After consent was obtained, alternatives discussed, and risk factors reviewed the patient consented for a right-sided subclavian catheter. His daughter signed a consent form was presently discussed complications and risk factors and indications. I chose a right subclavian base to his poor lung function on the right and felt if there was a complication of a hemopneumothorax this most likely would not affect his  respiratory status. The right subclavian area was prepped and draped in sterile fashion after timeout was performed and the patient had needle stick 1 after anesthetic with venous blood return. Patient had the catheter triple lumen fed by Seldinger technique. Patient tolerated procedure well. Portable chest x-ray shows no complications and appears to occur also slightly into the azygous vein. The patient had all 3 ports flushed and appeared to have good venous return etc. we're unable to obtain a urine in and out catheterization did not show any urine return initially prior to IV fluid resuscitation. Later we were able to establish a in and out catheterization with urine return the patient's blood pressures gradually stabilized at this point and the patient's lactic acid level was 8.6 and require unit continued resuscitation.   Critical Care:  CRITICAL CARE Performed by: Daymon Larsen   Total critical care time: 53 minutes  Critical care time was exclusive of  separately billable procedures and treating other patients.  Critical care was necessary to treat or prevent imminent or life-threatening deterioration.  Critical care was time spent personally by me on the following activities: development of treatment plan with patient and/or surrogate as well as nursing, discussions with consultants, evaluation of patient's response to treatment, examination of patient, obtaining history from patient or surrogate, ordering and performing treatments and interventions, ordering and review of laboratory studies, ordering and review of radiographic studies, pulse oximetry and re-evaluation of patient's condition. Initial evaluation for hypotension and abdominal pain with findings of sepsis etc.    ED Course:  The initial clinical concern with the patient being afebrile by rectal temperature with abdominal and flank pain was for possible abdominal aneurysm. The patient was established for the CAT scan as  we continued IV fluid resuscitation. It became more evident the patient was septic after performance of a rectal temperature which showed a fever. Patient was initiated on sepsis protocol and IV antibiotic therapy. He received meropenem and also vancomycin. The patient has a previous history of a poor ejection fraction with previous history of cardiomyopathy and diastolic heart failure. He was given 2 L normal saline bolus and then it was felt that he would require pressor therapy for more stabilization. Patient's abdominal CT did not show any findings that were surgical and the patient was initiated on Levaquin fed drip  Assessment: * Sepsis Right subclavian central line  Final Clinical Impression: *  Final diagnoses:  Abdominal pain  Sepsis, due to unspecified organism [facility])     Plan: * Intensive Care Unit            Daymon Larsen, MD 03/02/16 2303

## 2016-03-03 ENCOUNTER — Ambulatory Visit: Payer: Medicare Other | Admitting: Urology

## 2016-03-03 DIAGNOSIS — J9621 Acute and chronic respiratory failure with hypoxia: Secondary | ICD-10-CM

## 2016-03-03 DIAGNOSIS — I959 Hypotension, unspecified: Secondary | ICD-10-CM

## 2016-03-03 DIAGNOSIS — R6520 Severe sepsis without septic shock: Secondary | ICD-10-CM

## 2016-03-03 DIAGNOSIS — A419 Sepsis, unspecified organism: Principal | ICD-10-CM

## 2016-03-03 LAB — CBC
HCT: 33.8 % — ABNORMAL LOW (ref 40.0–52.0)
Hemoglobin: 11.4 g/dL — ABNORMAL LOW (ref 13.0–18.0)
MCH: 29.7 pg (ref 26.0–34.0)
MCHC: 33.8 g/dL (ref 32.0–36.0)
MCV: 87.9 fL (ref 80.0–100.0)
PLATELETS: 159 10*3/uL (ref 150–440)
RBC: 3.85 MIL/uL — ABNORMAL LOW (ref 4.40–5.90)
RDW: 17.8 % — AB (ref 11.5–14.5)
WBC: 17.6 10*3/uL — AB (ref 3.8–10.6)

## 2016-03-03 LAB — BASIC METABOLIC PANEL
Anion gap: 6 (ref 5–15)
BUN: 21 mg/dL — AB (ref 6–20)
CALCIUM: 8 mg/dL — AB (ref 8.9–10.3)
CO2: 27 mmol/L (ref 22–32)
Chloride: 103 mmol/L (ref 101–111)
Creatinine, Ser: 1 mg/dL (ref 0.61–1.24)
GFR calc Af Amer: >60 mL/min (ref >60)
GLUCOSE: 130 mg/dL — AB (ref 65–99)
Potassium: 3.9 mmol/L (ref 3.5–5.1)
SODIUM: 136 mmol/L (ref 135–145)

## 2016-03-03 LAB — MAGNESIUM: MAGNESIUM: 1.5 mg/dL — AB (ref 1.7–2.4)

## 2016-03-03 LAB — DIGOXIN LEVEL: Digoxin Level: 1.4 ng/mL (ref 0.8–2.0)

## 2016-03-03 LAB — TROPONIN I
TROPONIN I: 0.04 ng/mL — AB (ref <0.03)
Troponin I: 0.03 ng/mL (ref <0.03)

## 2016-03-03 LAB — PHOSPHORUS: Phosphorus: 2.9 mg/dL (ref 2.5–4.6)

## 2016-03-03 LAB — PROCALCITONIN: Procalcitonin: 0.48 ng/mL

## 2016-03-03 LAB — MRSA PCR SCREENING: MRSA BY PCR: NEGATIVE

## 2016-03-03 LAB — LACTIC ACID, PLASMA
LACTIC ACID, VENOUS: 2.4 mmol/L — AB (ref 0.5–1.9)
Lactic Acid, Venous: 1.7 mmol/L (ref 0.5–1.9)

## 2016-03-03 MED ORDER — BENZONATATE 100 MG PO CAPS
100.0000 mg | ORAL_CAPSULE | Freq: Three times a day (TID) | ORAL | Status: DC
  Administered 2016-03-03: 100 mg via ORAL
  Filled 2016-03-03: qty 1

## 2016-03-03 MED ORDER — BENZONATATE 100 MG PO CAPS
100.0000 mg | ORAL_CAPSULE | Freq: Once | ORAL | Status: AC
Start: 2016-03-03 — End: 2016-03-03
  Administered 2016-03-03: 100 mg via ORAL
  Filled 2016-03-03: qty 1

## 2016-03-03 MED ORDER — GUAIFENESIN-CODEINE 100-10 MG/5ML PO SOLN
10.0000 mL | ORAL | Status: DC | PRN
  Administered 2016-03-03: 10 mL via ORAL
  Filled 2016-03-03: qty 10

## 2016-03-03 MED ORDER — DEXTROSE 5 % IV SOLN
2.0000 ug/min | INTRAVENOUS | Status: DC
  Filled 2016-03-03: qty 16

## 2016-03-03 MED ORDER — MEROPENEM-SODIUM CHLORIDE 1 GM/50ML IV SOLR
1.0000 g | Freq: Three times a day (TID) | INTRAVENOUS | Status: DC
  Administered 2016-03-03 – 2016-03-05 (×6): 1 g via INTRAVENOUS
  Filled 2016-03-03 (×12): qty 50

## 2016-03-03 MED ORDER — FUROSEMIDE 10 MG/ML IJ SOLN
40.0000 mg | Freq: Once | INTRAMUSCULAR | Status: AC
  Administered 2016-03-03: 40 mg via INTRAVENOUS

## 2016-03-03 MED ORDER — ENOXAPARIN SODIUM 40 MG/0.4ML ~~LOC~~ SOLN
40.0000 mg | SUBCUTANEOUS | Status: DC
  Administered 2016-03-03 – 2016-03-04 (×2): 40 mg via SUBCUTANEOUS
  Filled 2016-03-03 (×2): qty 0.4

## 2016-03-03 MED ORDER — MAGNESIUM SULFATE 4 GM/100ML IV SOLN
4.0000 g | Freq: Once | INTRAVENOUS | Status: AC
  Administered 2016-03-03: 4 g via INTRAVENOUS
  Filled 2016-03-03: qty 100

## 2016-03-03 MED ORDER — FUROSEMIDE 10 MG/ML IJ SOLN
INTRAMUSCULAR | Status: AC
  Administered 2016-03-03: 40 mg via INTRAVENOUS
  Filled 2016-03-03: qty 4

## 2016-03-03 MED ORDER — VANCOMYCIN HCL 10 G IV SOLR
1250.0000 mg | INTRAVENOUS | Status: DC
  Administered 2016-03-03: 1250 mg via INTRAVENOUS
  Filled 2016-03-03 (×2): qty 1250

## 2016-03-03 NOTE — Progress Notes (Signed)
MEDICATION RELATED CONSULT NOTE - INITIAL   Pharmacy Consult for electrolyte replacement Indication:   Allergies  Allergen Reactions  . Penicillin G Hives    Has patient had a PCN reaction causing immediate rash, facial/tongue/throat swelling, SOB or lightheadedness with hypotension: Yes Has patient had a PCN reaction causing severe rash involving mucus membranes or skin necrosis: No Has patient had a PCN reaction that required hospitalization No Has patient had a PCN reaction occurring within the last 10 years: No If all of the above answers are "NO", then may proceed with Cephalosporin use.  . Sulfa Antibiotics Rash    Patient Measurements: Height: '5\' 10"'$  (177.8 cm) Weight: 180 lb 8.9 oz (81.9 kg) IBW/kg (Calculated) : 73 Adjusted Body Weight: 77kg  Vital Signs: Temp: 97.9 F (36.6 C) (01/29 0800) Temp Source: Oral (01/29 0800) BP: 96/54 (01/29 1000) Pulse Rate: 93 (01/29 1000) Intake/Output from previous day: 01/28 0701 - 01/29 0700 In: 1500 [IV Piggyback:1500] Out: 250 [Urine:250] Intake/Output from this shift: Total I/O In: 422.6 [P.O.:240; I.V.:82.6; IV Piggyback:100] Out: 675 [Urine:675]  Labs:  Recent Labs  03/02/16 2000 03/03/16 0401  WBC 17.1* 17.6*  HGB 12.8* 11.4*  HCT 39.9* 33.8*  PLT 236 159  CREATININE 1.17 1.00  MG  --  1.5*  PHOS  --  2.9  ALBUMIN 3.9  --   PROT 8.1  --   AST 30  --   ALT 11*  --   ALKPHOS 78  --   BILITOT 0.7  --    Estimated Creatinine Clearance: 61.8 mL/min (by C-G formula based on SCr of 1 mg/dL).   Microbiology: Recent Results (from the past 720 hour(s))  Culture, blood (Routine X 2) w Reflex to ID Panel     Status: None (Preliminary result)   Collection Time: 03/02/16  8:00 PM  Result Value Ref Range Status   Specimen Description BLOOD BLOOD LEFT WRIST  Final   Special Requests   Final    BOTTLES DRAWN AEROBIC AND ANAEROBIC AER 2CC, ANA 3CC   Culture NO GROWTH < 12 HOURS  Final   Report Status PENDING   Incomplete  Culture, blood (Routine X 2) w Reflex to ID Panel     Status: None (Preliminary result)   Collection Time: 03/02/16  8:00 PM  Result Value Ref Range Status   Specimen Description BLOOD BLOOD RIGHT ARM  Final   Special Requests   Final    BOTTLES DRAWN AEROBIC AND ANAEROBIC AER 11CC, ANA [place]   Culture NO GROWTH < 12 HOURS  Final   Report Status PENDING  Incomplete  MRSA PCR Screening     Status: None   Collection Time: 03/02/16 11:43 PM  Result Value Ref Range Status   MRSA by PCR NEGATIVE NEGATIVE Final    Comment:        The GeneXpert MRSA Assay (FDA approved for NASAL specimens only), is one component of a comprehensive MRSA colonization surveillance program. It is not intended to diagnose MRSA infection nor to guide or monitor treatment for MRSA infections.     Medical History: Past Medical History:  Diagnosis Date  . Acquired hypothyroidism 11/02/2014  . BPH (benign prostatic hyperplasia)   . Cardiac defibrillator in place 11/02/2014  . Cardiomyopathy ([place])   . CHF (congestive heart failure) ([place])   . Chronic diastolic heart failure ([place]) 11/02/2014  . Gastro-esophageal reflux disease without esophagitis 11/02/2014  . H/O malignant neoplasm 11/09/2014  . Heart valve disease 11/02/2014  . History  of atrial fibrillation 11/02/2014  . Lung cancer ([place])   . Neuropathy ([place]) 11/02/2014  . Orthostasis 11/02/2014  . Pure hypercholesterolemia 11/02/2014  . Valvular heart disease     Medications:    Assessment: 82 y/o M with a h/o atrial fibrillation, pulmonary fibrosis, and CHF s/p defibrillator placement admitted with septic shock.   Plan:  Magnesium sulfate 4 g iv once and f/u AM labs.   [name] D 03/03/2016,10:43 AM

## 2016-03-03 NOTE — Progress Notes (Signed)
MEDICATION RELATED CONSULT NOTE - INITIAL   Pharmacy Consult for electrolyte replacement Indication:   Allergies  Allergen Reactions  . Penicillin G Hives    Has patient had a PCN reaction causing immediate rash, facial/tongue/throat swelling, SOB or lightheadedness with hypotension: Yes Has patient had a PCN reaction causing severe rash involving mucus membranes or skin necrosis: No Has patient had a PCN reaction that required hospitalization No Has patient had a PCN reaction occurring within the last 10 years: No If all of the above answers are "NO", then may proceed with Cephalosporin use.  . Sulfa Antibiotics Rash    Patient Measurements: Height: '5\' 10"'$  (177.8 cm) Weight: 180 lb 8.9 oz (81.9 kg) IBW/kg (Calculated) : 73 Adjusted Body Weight: 77kg  Vital Signs: Temp: 98.4 F (36.9 C) (01/28 2347) Temp Source: Axillary (01/28 2347) BP: 115/73 (01/29 0300) Pulse Rate: 95 (01/29 0300) Intake/Output from previous day: 01/28 0701 - 01/29 0700 In: 1250 [IV Piggyback:1250] Out: -  Intake/Output from this shift: Total I/O In: 1250 [IV Piggyback:1250] Out: -   Labs:  Recent Labs  03/02/16 2000  WBC 17.1*  HGB 12.8*  HCT 39.9*  PLT 236  CREATININE 1.17  ALBUMIN 3.9  PROT 8.1  AST 30  ALT 11*  ALKPHOS 78  BILITOT 0.7   Estimated Creatinine Clearance: 52.9 mL/min (by C-G formula based on SCr of 1.17 mg/dL).   Microbiology: Recent Results (from the past 720 hour(s))  MRSA PCR Screening     Status: None   Collection Time: 03/02/16 11:43 PM  Result Value Ref Range Status   MRSA by PCR NEGATIVE NEGATIVE Final    Comment:        The GeneXpert MRSA Assay (FDA approved for NASAL specimens only), is one component of a comprehensive MRSA colonization surveillance program. It is not intended to diagnose MRSA infection nor to guide or monitor treatment for MRSA infections.     Medical History: Past Medical History:  Diagnosis Date  . Acquired hypothyroidism  11/02/2014  . BPH (benign prostatic hyperplasia)   . Cardiac defibrillator in place 11/02/2014  . Cardiomyopathy ([place])   . CHF (congestive heart failure) ([place])   . Chronic diastolic heart failure ([place]) 11/02/2014  . Gastro-esophageal reflux disease without esophagitis 11/02/2014  . H/O malignant neoplasm 11/09/2014  . Heart valve disease 11/02/2014  . History of atrial fibrillation 11/02/2014  . Lung cancer ([place])   . Neuropathy ([place]) 11/02/2014  . Orthostasis 11/02/2014  . Pure hypercholesterolemia 11/02/2014  . Valvular heart disease     Medications:    Assessment: Electrolyte panel WNL   Goal of Therapy:    Plan:  No electrolyte replacement indicated at this time. F/u BMP in AM.  Russell Howard S 03/03/2016,3:57 AM

## 2016-03-03 NOTE — Care Management (Signed)
Admitted from home to icu with sepsis and septic shock due to cystitis.  He is requiring norepinephrine infusion.  He is a DNR.It appears that at previous discharge 01/14/2016 was referred to [facility] and home 02 through [facility]

## 2016-03-03 NOTE — Progress Notes (Signed)
Pharmacy Antibiotic Note  Russell Howard is a 82 y.o. male admitted on 03/02/2016 with sepsis.  Pharmacy has been consulted for vancomycin and meropenem dosing.  Plan: DW 78kg  Vd 55L kei 0.048 hr-1  T1/2 14 hours Vancomycin 1250 mg q 18 hours ordered with stacked dosing. Level before 5th dose. Goal trough 15-20.  Meroepenem 1 gram q 8 hours ordered.  Height: '5\' 10"'$  (177.8 cm) Weight: 180 lb 8.9 oz (81.9 kg) IBW/kg (Calculated) : 73  Temp (24hrs), Avg:98 F (36.7 C), Min:97.5 F (36.4 C), Max:98.4 F (36.9 C)   Recent Labs Lab 03/02/16 2000  WBC 17.1*  CREATININE 1.17  LATICACIDVEN 8.6*    Estimated Creatinine Clearance: 52.9 mL/min (by C-G formula based on SCr of 1.17 mg/dL).    Allergies  Allergen Reactions  . Penicillin G Hives    Has patient had a PCN reaction causing immediate rash, facial/tongue/throat swelling, SOB or lightheadedness with hypotension: Yes Has patient had a PCN reaction causing severe rash involving mucus membranes or skin necrosis: No Has patient had a PCN reaction that required hospitalization No Has patient had a PCN reaction occurring within the last 10 years: No If all of the above answers are "NO", then may proceed with Cephalosporin use.  . Sulfa Antibiotics Rash    Antimicrobials this admission: vancomycin 1/28 >>  meropenem 1/28 >>   Dose adjustments this admission:   Microbiology results: 1/28 BCx: pending 1/28 UCx: pending  1/28 MRSA PCR: pendnig     1/28 CXR: R lung consolidation 1/28 UA: LE(+) NO2(-) WC 6-30  Thank you for allowing pharmacy to be a part of this patient's care.  Brandon Scarbrough S 03/03/2016 12:07 AM

## 2016-03-03 NOTE — Progress Notes (Signed)
Patient remains afebrile,iv antibiotics continue,urinating well,denies pain,close monitoring in progress.

## 2016-03-03 NOTE — Progress Notes (Signed)
ED RN reported to CCU that pt oral and axillary temperature was WDL, however when a rectal probe was inserted core temp was 102. Upon arrival to CCU pt refused rectal thermometer. Pt also refused foley placement as he prefers to  In and out cath twice per day like he does at home.

## 2016-03-03 NOTE — Progress Notes (Signed)
Comfortable on [place] O2. Presenting symptoms were nausea, fever, chills and found to be hypotensive. Now reports cough - slightly rattling but nonproductive. Now off vasopressors X several hours.   Vitals:   03/03/16 0800 03/03/16 0900 03/03/16 1000 03/03/16 1222  BP: (!) 92/55 (!) 92/56 (!) 96/54 (!) 104/53  Pulse: 90 (!) 102 93 88  Resp: (!) '23 11 20 18  '$ Temp: 97.9 F (36.6 C)   98.4 F (36.9 C)  TempSrc: Oral   Oral  SpO2: 95% 91% 96% 98%  Weight:      Height:       NAD HEENT WNL Diminished BS on R, + rhonchi Paced rhythm, regular, no M NABS No edema No focal neuro deficits  BMP Latest Ref Rng & Units 03/03/2016 03/02/2016 01/14/2016  Glucose 65 - 99 mg/dL 130(H) 163(H) -  BUN 6 - 20 mg/dL 21(H) 25(H) -  Creatinine 0.61 - 1.24 mg/dL 1.00 1.17 0.75  Sodium 135 - 145 mmol/L 136 139 -  Potassium 3.5 - 5.1 mmol/L 3.9 3.8 -  Chloride 101 - 111 mmol/L 103 102 -  CO2 22 - 32 mmol/L 27 22 -  Calcium 8.9 - 10.3 mg/dL 8.0(L) 9.0 -   CBC Latest Ref Rng & Units 03/03/2016 03/02/2016 01/13/2016  WBC 3.8 - 10.6 K/uL 17.6(H) 17.1(H) 7.7  Hemoglobin 13.0 - 18.0 g/dL 11.4(L) 12.8(L) 12.0(L)  Hematocrit 40.0 - 52.0 % 33.8(L) 39.9(L) 35.9(L)  Platelets 150 - 440 K/uL 159 236 184    INDWELLING DEVICES:: R [place] CVL 01/28 >>   MICRO DATA: MRSA PCR 01/28 >> NEG  Urine 01/28 >>   Blood 01/28 >>    ANTIMICROBIALS:  Vanc 01/28 >> 01/29 Meropenem 01/28 >>  CXR 03/03/15: Extensive chroic fibrosis changes on R (prior XRT for lung cancer), edema pattern on L  IMPRESSION: 1) Multiple chronic medical problems inc hypothyroidism, BPH, cardiomyopathy (LVEF 45%), AICD/PPM, hypertension, GERD, chronic atrial fibrillation 2) Acute on chronic respiratory failure with hypoxemia 3) 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 Also mild pyuria so possible cystitis  Hypotension resolved - was only briefly on norepinephrine  Renal function  improving  All cx results NGTD. MRSA PCR is negative 6) Malnutrition due to chronic illnesses 7) History of urinary retention 8) Suspect pulmonary edema  PLAN/REC: 1) Transfer to Telemetry 2) Discussed with Dr Ether Griffins. Hospitalists to assume care after transfer 3) DC vancomycin. Continue meropenem for now 4) Furosemide X 1  Merton Border, MD PCCM service Mobile [phone] Pager [phone] 03/03/2016

## 2016-03-03 NOTE — Progress Notes (Signed)
Notified Dr. Mortimer Fries of troponin of .03 and .04, no new order received. Patient slept throughout night. On admission he had worsening ronchi between 0000 and 0200. Pt received 40 of Lasix.  Lung sounds are still rhonchus but have improved. Pt has had adequate UOP and has used the urinal although he reports self catheterization twice a day at home. VSS

## 2016-03-03 NOTE — Care Management (Signed)
patient is not currently open to [facility] was closed to service 1/11.  Was being followed by palliative in the home with the last visit 12/23.  The next visit was to have been in 4 weeks.  Patient will need to have palliative care in the outpatient setting.

## 2016-03-04 LAB — BASIC METABOLIC PANEL
ANION GAP: 6 (ref 5–15)
BUN: 17 mg/dL (ref 6–20)
CHLORIDE: 104 mmol/L (ref 101–111)
CO2: 26 mmol/L (ref 22–32)
Calcium: 8.5 mg/dL — ABNORMAL LOW (ref 8.9–10.3)
Creatinine, Ser: 0.89 mg/dL (ref 0.61–1.24)
GFR calc Af Amer: >60 mL/min (ref >60)
GFR calc non Af Amer: >60 mL/min (ref >60)
GLUCOSE: 138 mg/dL — AB (ref 65–99)
POTASSIUM: 4 mmol/L (ref 3.5–5.1)
Sodium: 136 mmol/L (ref 135–145)

## 2016-03-04 LAB — BLOOD CULTURE ID PANEL (REFLEXED)
ACINETOBACTER BAUMANNII: NOT DETECTED
CANDIDA ALBICANS: NOT DETECTED
CANDIDA GLABRATA: NOT DETECTED
Candida krusei: NOT DETECTED
Candida parapsilosis: NOT DETECTED
Candida tropicalis: NOT DETECTED
ENTEROBACTER CLOACAE COMPLEX: NOT DETECTED
ENTEROBACTERIACEAE SPECIES: NOT DETECTED
Enterococcus species: NOT DETECTED
Escherichia coli: NOT DETECTED
Haemophilus influenzae: NOT DETECTED
KLEBSIELLA PNEUMONIAE: NOT DETECTED
Klebsiella oxytoca: NOT DETECTED
Listeria monocytogenes: NOT DETECTED
METHICILLIN RESISTANCE: DETECTED — AB
NEISSERIA MENINGITIDIS: NOT DETECTED
PSEUDOMONAS AERUGINOSA: NOT DETECTED
Proteus species: NOT DETECTED
STREPTOCOCCUS AGALACTIAE: NOT DETECTED
STREPTOCOCCUS PNEUMONIAE: NOT DETECTED
STREPTOCOCCUS PYOGENES: NOT DETECTED
STREPTOCOCCUS SPECIES: NOT DETECTED
Serratia marcescens: NOT DETECTED
Staphylococcus aureus (BCID): NOT DETECTED
Staphylococcus species: DETECTED — AB

## 2016-03-04 LAB — URINE CULTURE: Culture: NO GROWTH

## 2016-03-04 LAB — MAGNESIUM: MAGNESIUM: 2 mg/dL (ref 1.7–2.4)

## 2016-03-04 MED ORDER — SODIUM CHLORIDE 0.9 % IV SOLN
1500.0000 mg | INTRAVENOUS | Status: DC
  Administered 2016-03-04 (×2): 1500 mg via INTRAVENOUS
  Filled 2016-03-04 (×5): qty 1500

## 2016-03-04 NOTE — Evaluation (Signed)
Occupational Therapy Evaluation Patient Details Name: Russell Howard MRN: [id number] DOB: [birth year] Today's Date: 03/04/2016    History of Present Illness 82 y/o male admittedon 1/28/2018with sepsis with PMHx significant for hypothyroidism, BPH, cardiomyopathy, HTN, GERD, chronic A-fib, hx of lung cancer, urinary retention.   Clinical Impression   Pt seen for OT evaluation this date. Pt presents with no pain, decreased activity tolerance, reports "a little whoozy", on 4L O2 via [place] at start of session. Pt educated in ECS, PLB, and home modifications to support falls prevention and increase functional independence with self care tasks. However, pt reports he "doing fine" and is just fatigued due to infection right now. Pt educated in benefits of OT, yet pt politely but adamantly declining further OT services both while in hospital and at home. Pt very insistent that he not go to SNF for STR. Pt declined out of bed for OT evaluation, no specific reason given, just reiterated that he does not need OT. Recommending toilet riser with bilat arm rails to improve toilet transfers and safety, which pt verbalized that he thought it might be helpful. OT believes pt could benefit from additional OT services to address noted impairments and functional deficits, address in further detail ECS and home mods, but unlikely pt will be willing to participate. Defer to PT for follow up recommendations after hospitalization pending pt progress. OT will attempt to see for treatment at later date/time, should pt change his mind.    Follow Up Recommendations  Supervision/Assistance - 24 hour    Equipment Recommendations  Toilet rise with handles    Recommendations for Other Services       Precautions / Restrictions Precautions Precautions: Fall Restrictions Weight Bearing Restrictions: No      Mobility Bed Mobility               General bed mobility comments: did not assess, pt declined out of bed  for OT assessment  Transfers                 General transfer comment: did not assess, pt declined out of bed for OT assessment    Balance                                            ADL Overall ADL's : At baseline                                       General ADL Comments: Pt reports being at baseline in terms of assist for ADL, reports knowing about A/E, declines additional education/training for ADL at this time but requires mod-max assist for ADL at baseline, wears nonskid socks at home, enjoys going out to eat with family, goes to church 2x/wk, enjoys country gospel music     Vision Vision Assessment?: No apparent visual deficits   Environmental education officer      Pertinent Vitals/Pain Pain Assessment: No/denies pain     Hand Dominance     Extremity/Trunk Assessment Upper Extremity Assessment Upper Extremity Assessment: Overall WFL for tasks assessed   Lower Extremity Assessment Lower Extremity Assessment: Defer to PT evaluation       Communication Communication Communication: No difficulties   Cognition Arousal/Alertness: Awake/alert Behavior During Therapy: WFL for tasks assessed/performed Overall Cognitive  Status: Within Functional Limits for tasks assessed                     General Comments       Exercises   Other Exercises Other Exercises: pt briefly educated in pursed lip breathing, energy conservation strategies to support functional independence at home and minimize falls risk   Shoulder Instructions      Home Living Family/patient expects to be discharged to:: Private residence Living Arrangements: Children (lives with son and daughter in [place]) Available Help at Discharge: Family;Available 24 hours/day (daughter in law is available 24/7) Type of Home: House Home Access: Ramped entrance     Home Layout: One level     Bathroom Shower/Tub: Tub/shower unit Shower/tub characteristics:  Architectural technologist: Standard (pt not 100% sure)     Home Equipment: Walker - 2 wheels;Wheelchair - manual          Prior Functioning/Environment Level of Independence: Needs assistance  Gait / Transfers Assistance Needed: household mobility using RW short distances; dtr in law assists with toilet transfers, tub transfers ADL's / Homemaking Assistance Needed: requires max assist for bathing, dressing from daughter in law            OT Problem List: Cardiopulmonary status limiting activity;Decreased activity tolerance   OT Treatment/Interventions: Self-care/ADL training;Energy conservation;Patient/family education;DME and/or AE instruction    OT Goals(Current goals can be found in the care plan section) Acute Rehab OT Goals Patient Stated Goal: go home OT Goal Formulation: With patient Time For Goal Achievement: 03/18/16 Potential to Achieve Goals: Good  OT Frequency: Min 1X/week   Barriers to D/C:            Co-evaluation              End of Session    Activity Tolerance: Patient limited by fatigue Patient left: in bed;with call bell/phone within reach   Time: 1502-1529 OT Time Calculation (min): 27 min Charges:  OT Evaluation $OT Eval Moderate Complexity: 1 Procedure OT Treatments $Self Care/Home Management : 8-22 mins G-Codes:    Corky Sox, OTR/L 03/04/2016, 3:53 PM

## 2016-03-04 NOTE — Progress Notes (Signed)
[facility] [place] NAME: Russell Howard    MR#:  [id number]  DATE OF BIRTH:  [birth year]  SUBJECTIVE:  CHIEF COMPLAINT:  Patient is clinically doing okay shortness of breath is better and chronically lives on 2 L of home oxygen   REVIEW OF SYSTEMS:  CONSTITUTIONAL: No fever, fatigue or weakness.  EYES: No blurred or double vision.  EARS, NOSE, AND THROAT: No tinnitus or ear pain.  RESPIRATORY: No cough, shortness of breath, wheezing or hemoptysis.  CARDIOVASCULAR: No chest pain, orthopnea, edema.  GASTROINTESTINAL: No nausea, vomiting, diarrhea or abdominal pain.  GENITOURINARY: No dysuria, hematuria.  ENDOCRINE: No polyuria, nocturia,  HEMATOLOGY: No anemia, easy bruising or bleeding SKIN: No rash or lesion. MUSCULOSKELETAL: No joint pain or arthritis.   NEUROLOGIC: No tingling, numbness, weakness.  PSYCHIATRY: No anxiety or depression.   DRUG ALLERGIES:   Allergies  Allergen Reactions  . Penicillin G Hives    Has patient had a PCN reaction causing immediate rash, facial/tongue/throat swelling, SOB or lightheadedness with hypotension: Yes Has patient had a PCN reaction causing severe rash involving mucus membranes or skin necrosis: No Has patient had a PCN reaction that required hospitalization No Has patient had a PCN reaction occurring within the last 10 years: No If all of the above answers are "NO", then may proceed with Cephalosporin use.  . Sulfa Antibiotics Rash    VITALS:  Blood pressure (!) 105/58, pulse 94, temperature 98.4 F (36.9 C), temperature source Oral, resp. rate 14, height '5\' 10"'$  (1.778 m), weight 79.9 kg (176 lb 1.6 oz), SpO2 98 %.  PHYSICAL EXAMINATION:  GENERAL:  82 y.o.-year-old patient lying in the bed with no acute distress.  EYES: Pupils equal, round, reactive to light and accommodation. No scleral icterus. Extraocular muscles intact.  HEENT: Head atraumatic, normocephalic. Oropharynx and  nasopharynx clear.  NECK:  Supple, no jugular venous distention. No thyroid enlargement, no tenderness.  LUNGS: Moderate breath sounds bilaterally, no wheezing, rales,rhonchi or crepitation. No use of accessory muscles of respiration.  CARDIOVASCULAR: S1, S2 normal. No murmurs, rubs, or gallops.  ABDOMEN: Soft, nontender, nondistended. Bowel sounds present. No organomegaly or mass.  EXTREMITIES: No pedal edema, cyanosis, or clubbing.  NEUROLOGIC: Cranial nerves II through XII are intact. Muscle strength 5/5 in all extremities. Sensation intact. Gait not checked.  PSYCHIATRIC: The patient is alert and oriented x 3.  SKIN: No obvious rash, lesion, or ulcer.    LABORATORY PANEL:   CBC  Recent Labs Lab 03/03/16 0401  WBC 17.6*  HGB 11.4*  HCT 33.8*  PLT 159   ------------------------------------------------------------------------------------------------------------------  Chemistries   Recent Labs Lab 03/02/16 2000  03/04/16 0456  NA 139  < > 136  K 3.8  < > 4.0  CL 102  < > 104  CO2 22  < > 26  GLUCOSE 163*  < > 138*  BUN 25*  < > 17  CREATININE 1.17  < > 0.89  CALCIUM 9.0  < > 8.5*  MG  --   < > 2.0  AST 30  --   --   ALT 11*  --   --   ALKPHOS 78  --   --   BILITOT 0.7  --   --   < > = values in this interval not displayed. ------------------------------------------------------------------------------------------------------------------  Cardiac Enzymes  Recent Labs Lab 03/03/16 0401  TROPONINI 0.04*   ------------------------------------------------------------------------------------------------------------------  RADIOLOGY:  Dg Chest Port 1 View  Result Date:  03/02/2016 CLINICAL DATA:  Right-sided central line. EXAM: PORTABLE CHEST 1 VIEW COMPARISON:  03/02/2016 at 20:04 FINDINGS: New right subclavian central line reaches the central circulation, extending into the low SVC and then curving back on itself with the tip at approximately the level of the  azygos vein junction. No pneumothorax. No other interval change. Persistent right lung consolidation and right hemidiaphragm elevation. Persistent diffuse interstitial coarsening. IMPRESSION: The new right-sided central line reaches the central circulation but it is curved back on itself as detailed above. No pneumothorax. Electronically Signed   By: Andreas Newport M.D.   On: 03/02/2016 22:25   Dg Chest Port 1 View  Result Date: 03/02/2016 CLINICAL DATA:  Shortness of breath and vomiting. EXAM: PORTABLE CHEST 1 VIEW COMPARISON:  01/09/2016 and prior chest radiographs.  11/10/2015 CT FINDINGS: Cardiomegaly and cardiac valve replacement changes noted. A left-sided pacemaker/ ICD again noted. Right hemithorax volume loss, loculated right pleural effusion and right lung consolidation/airspace disease has not significantly changed. Mild left lung interstitial prominence again noted. There is no evidence of pneumothorax or left pleural effusion. No acute bony abnormalities are noted. IMPRESSION: No evidence of acute abnormality. Unchanged right hemithorax volume loss, loculated effusion and right lung consolidation/ airspace disease. Electronically Signed   By: Margarette Canada M.D.   On: 03/02/2016 20:37   Ct Angio Abd/pel W/ And/or W/o  Result Date: 03/02/2016 CLINICAL DATA:  Left lower abdominal pain, onset at 18:30 tonight. EXAM: CTA ABDOMEN AND PELVIS wITHOUT AND WITH CONTRAST TECHNIQUE: Multidetector CT imaging of the abdomen and pelvis was performed using the standard protocol during bolus administration of intravenous contrast. Multiplanar reconstructed images and MIPs were obtained and reviewed to evaluate the vascular anatomy. CONTRAST:  100 mL Isovue 370 intravenous COMPARISON:  01/13/2016 FINDINGS: VASCULAR Aorta: The abdominal aorta is normal in caliber with extensive atherosclerotic calcification. No dissection or significant stenosis. Celiac: Heavily calcified at its origin but patent.  No aneurysm.  SMA: Heavily calcified at its origin and probably stenotic. Cannot exclude a short focal occlusion within the heavily calcified proximal SMA. No aneurysm. Renals: Heavily calcified at their origins, possibly stenotic. No aneurysm or occlusion. IMA: Patent and normal in caliber. Inflow: Heavily calcified but normal in caliber and patent. Proximal Outflow: Extensive atherosclerotic calcification without aneurysm or occlusion. Veins: No obvious venous abnormality within the limitations of this arterial phase study. Review of the MIP images confirms the above findings. NON-VASCULAR Lower chest: Unchanged loculated right pleural collection. Unchanged fibrotic appearing interstitial coarsening in the bases. Hepatobiliary: No focal liver lesion. There is a 12 mm calculus in the gallbladder, unchanged. No gallbladder inflammation or bile duct dilatation. Pancreas: Unremarkable. No pancreatic ductal dilatation or surrounding inflammatory changes. Spleen: Normal in size without focal abnormality. Adrenals/Urinary Tract: Both adrenals are normal. Unchanged low-attenuation right renal lesions, probably cysts but too small for definitive characterization. Unremarkable collecting systems and ureters bilaterally. There is very prominent mural thickening at the anterior aspect of the urinary bladder, and this has worsened from 01/13/2016. Cannot exclude neoplasm, but the rapid worsening of mural thickening is more consistent with infection or inflammation. Stomach/Bowel: Hiatal hernia. Small bowel is unremarkable. Colon is unremarkable. No acute bowel inflammation. No bowel obstruction or perforation. Uncomplicated mild colonic diverticulosis. Lymphatic: No adenopathy in the abdomen or pelvis. Reproductive: Unremarkable Other: No focal inflammation.  No ascites. Musculoskeletal: No significant skeletal lesion. IMPRESSION: VASCULAR Diffuse atherosclerotic calcification of the aorta and its major branches. The SMA origin is probably  stenotic, and a very short  focal occlusion cannot be entirely excluded although this does not appear significantly different from 01/13/2016. No acute vascular abnormality is evident. NON-VASCULAR 1. Marked mural thickening at the anterior aspect of the urinary bladder, worsened from 01/13/2016. Inflammation or infection is more likely than neoplasm given the rapid changes. 2. Cholelithiasis. 3. Hiatal hernia. 4. Uncomplicated mild colonic diverticulosis. Electronically Signed   By: Andreas Newport M.D.   On: 03/02/2016 21:59    EKG:   Orders placed or performed during the hospital encounter of 03/02/16  . EKG 12-Lead  . EKG 12-Lead  . 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 Jamal Collin in 4 weeks Appreciate pulmonology recommendations  # Hx of lung cancer-follow-up with oncology as recommended  #Severe sepsis - suspect PNA. Also mild pyuria so possible cystitis             Hypotension resolved - was only briefly on norepinephrine             Renal function improving             All cx results NGTD. MRSA PCR is negative Will consider discharging the patient on levofloxacin at the time of discharge. Continue meropenem and discontinued vancomycin  # Malnutrition due to chronic illnesses.   dietary supplement  # History of urinary retention  #) Suspect pulmonary edema-continue Lasix   Generalized weakness physical therapy consulted      All the records are reviewed and case discussed with Care Management/Social Workerr. Management plans discussed with the patient, family and they are in agreement.  CODE STATUS: dnr  TOTAL TIME TAKING CARE OF THIS PATIENT: 36  minutes.   POSSIBLE D/C IN 1-2DAYS, DEPENDING ON CLINICAL CONDITION.  Note: This dictation was prepared with Dragon dictation along with smaller phrase technology. Any transcriptional errors that result from this process are unintentional.   Russell Howard M.D on 03/04/2016 at 6:04 PM  Between 7am to 6pm - Pager - [phone] After 6pm go to [email or web address] - password [email or web address] [facility]  Office  [phone]  CC: Primary care physician; [name], MD

## 2016-03-04 NOTE — Progress Notes (Signed)
Pharmacy Antibiotic Note  [name] is a 82 y.o. male admitted on 03/02/2016 with sepsis.  Pharmacy has been consulted for vancomycin dosing.  Plan: Vancomycin 1.5 gm IV Q18H (serum creatinine has decreased since admission), predicted trough 17 mcg/mL. Pharmacy will continue to follow and adjust as needed to maintain trough 15 to 20 mcg/mL.   Vd 53.6 L, ke 0.056 hr-1, T1/2 12.4 hr  Height: '5\' 10"'$  (177.8 cm) Weight: 180 lb 8.9 oz (81.9 kg) IBW/kg (Calculated) : 73  Temp (24hrs), Avg:98.1 F (36.7 C), Min:97.9 F (36.6 C), Max:98.4 F (36.9 C)   Recent Labs Lab 03/02/16 2000 03/02/16 2342 03/03/16 0401  WBC 17.1*  --  17.6*  CREATININE 1.17  --  1.00  LATICACIDVEN 8.6* 2.4* 1.7    Estimated Creatinine Clearance: 61.8 mL/min (by C-G formula based on SCr of 1 mg/dL).    Allergies  Allergen Reactions  . Penicillin G Hives    Has patient had a PCN reaction causing immediate rash, facial/tongue/throat swelling, SOB or lightheadedness with hypotension: Yes Has patient had a PCN reaction causing severe rash involving mucus membranes or skin necrosis: No Has patient had a PCN reaction that required hospitalization No Has patient had a PCN reaction occurring within the last 10 years: No If all of the above answers are "NO", then may proceed with Cephalosporin use.  . Sulfa Antibiotics Rash    Thank you for allowing pharmacy to be a part of this patient's care.  Laural Benes, Pharm.D., BCPS Clinical Pharmacist 03/04/2016 2:09 AM

## 2016-03-04 NOTE — Care Management (Signed)
Physical therapy consult is pending.  Occupational has assessed and recommends 24 hr assistance.

## 2016-03-04 NOTE — Progress Notes (Signed)
Patient states that he performs self- caths at home, and wishes to continue while he is here. MD willis notified. MD to place order. Will continue to monitor.   Iran Sizer M

## 2016-03-04 NOTE — Progress Notes (Signed)
MEDICATION RELATED CONSULT NOTE - Follow up  Pharmacy Consult for electrolyte replacement Indication:   Allergies  Allergen Reactions  . Penicillin G Hives    Has patient had a PCN reaction causing immediate rash, facial/tongue/throat swelling, SOB or lightheadedness with hypotension: Yes Has patient had a PCN reaction causing severe rash involving mucus membranes or skin necrosis: No Has patient had a PCN reaction that required hospitalization No Has patient had a PCN reaction occurring within the last 10 years: No If all of the above answers are "NO", then may proceed with Cephalosporin use.  . Sulfa Antibiotics Rash    Patient Measurements: Height: '5\' 10"'$  (177.8 cm) Weight: 176 lb 1.6 oz (79.9 kg) IBW/kg (Calculated) : 73 Adjusted Body Weight: 77kg  Vital Signs: Temp: 98.4 F (36.9 C) (01/30 1123) Temp Source: Oral (01/30 1123) BP: 105/58 (01/30 1123) Pulse Rate: 141 (01/30 1123) Intake/Output from previous day: 01/29 0701 - 01/30 0700 In: 952.6 [P.O.:720; I.V.:82.6; IV Piggyback:150] Out: 2375 [Urine:2375] Intake/Output from this shift: Total I/O In: 240 [P.O.:240] Out: 200 [Urine:200]  Labs:  Recent Labs  03/02/16 2000 03/03/16 0401 03/04/16 0456  WBC 17.1* 17.6*  --   HGB 12.8* 11.4*  --   HCT 39.9* 33.8*  --   PLT 236 159  --   CREATININE 1.17 1.00 0.89  MG  --  1.5* 2.0  PHOS  --  2.9  --   ALBUMIN 3.9  --   --   PROT 8.1  --   --   AST 30  --   --   ALT 11*  --   --   ALKPHOS 78  --   --   BILITOT 0.7  --   --    Estimated Creatinine Clearance: 69.5 mL/min (by C-G formula based on SCr of 0.89 mg/dL).    Assessment: 82 y/o M with a h/o atrial fibrillation, pulmonary fibrosis, and CHF s/p defibrillator placement admitted with septic shock.   K 4.0 (1/30) Mag 1.5 (1/29) --> 2.0 (1/30) after Mag sulfate 4g x1  Plan:  No supplementation needed at this time.  Recheck labs in AM  Rayna Sexton L 03/04/2016,1:53 PM

## 2016-03-04 NOTE — Progress Notes (Signed)
PHARMACY - PHYSICIAN COMMUNICATION CRITICAL VALUE ALERT - BLOOD CULTURE IDENTIFICATION (BCID)  Results for orders placed or performed during the hospital encounter of 03/02/16  Blood Culture ID Panel (Reflexed) (Collected: 03/02/2016  8:00 PM)  Result Value Ref Range   Enterococcus species NOT DETECTED NOT DETECTED   Listeria monocytogenes NOT DETECTED NOT DETECTED   Staphylococcus species DETECTED (A) NOT DETECTED   Staphylococcus aureus NOT DETECTED NOT DETECTED   Methicillin resistance DETECTED (A) NOT DETECTED   Streptococcus species NOT DETECTED NOT DETECTED   Streptococcus agalactiae NOT DETECTED NOT DETECTED   Streptococcus pneumoniae NOT DETECTED NOT DETECTED   Streptococcus pyogenes NOT DETECTED NOT DETECTED   Acinetobacter baumannii NOT DETECTED NOT DETECTED   Enterobacteriaceae species NOT DETECTED NOT DETECTED   Enterobacter cloacae complex NOT DETECTED NOT DETECTED   Escherichia coli NOT DETECTED NOT DETECTED   Klebsiella oxytoca NOT DETECTED NOT DETECTED   Klebsiella pneumoniae NOT DETECTED NOT DETECTED   Proteus species NOT DETECTED NOT DETECTED   Serratia marcescens NOT DETECTED NOT DETECTED   Haemophilus influenzae NOT DETECTED NOT DETECTED   Neisseria meningitidis NOT DETECTED NOT DETECTED   Pseudomonas aeruginosa NOT DETECTED NOT DETECTED   Candida albicans NOT DETECTED NOT DETECTED   Candida glabrata NOT DETECTED NOT DETECTED   Candida krusei NOT DETECTED NOT DETECTED   Candida parapsilosis NOT DETECTED NOT DETECTED   Candida tropicalis NOT DETECTED NOT DETECTED    Name of physician (or Provider) Contacted: Dr. Jannifer Franklin  Changes to prescribed antibiotics required: Restart vancomycin.  Laural Benes, Pharm.D., BCPS Clinical Pharmacist 03/04/2016  2:10 AM

## 2016-03-05 LAB — BASIC METABOLIC PANEL
Anion gap: 6 (ref 5–15)
BUN: 14 mg/dL (ref 6–20)
CALCIUM: 9 mg/dL (ref 8.9–10.3)
CO2: 29 mmol/L (ref 22–32)
Chloride: 104 mmol/L (ref 101–111)
Creatinine, Ser: 0.85 mg/dL (ref 0.61–1.24)
GFR calc Af Amer: >60 mL/min (ref >60)
GLUCOSE: 111 mg/dL — AB (ref 65–99)
Potassium: 4.3 mmol/L (ref 3.5–5.1)
SODIUM: 139 mmol/L (ref 135–145)

## 2016-03-05 LAB — CBC
HCT: 34.4 % — ABNORMAL LOW (ref 40.0–52.0)
HEMOGLOBIN: 11.7 g/dL — AB (ref 13.0–18.0)
MCH: 30 pg (ref 26.0–34.0)
MCHC: 34 g/dL (ref 32.0–36.0)
MCV: 88.3 fL (ref 80.0–100.0)
Platelets: 160 10*3/uL (ref 150–440)
RBC: 3.9 MIL/uL — ABNORMAL LOW (ref 4.40–5.90)
RDW: 17.6 % — ABNORMAL HIGH (ref 11.5–14.5)
WBC: 7.3 10*3/uL (ref 3.8–10.6)

## 2016-03-05 LAB — CULTURE, BLOOD (ROUTINE X 2)

## 2016-03-05 LAB — MAGNESIUM: MAGNESIUM: 1.9 mg/dL (ref 1.7–2.4)

## 2016-03-05 MED ORDER — ACETAMINOPHEN 325 MG PO TABS: 650.0000 mg | ORAL_TABLET | Freq: Four times a day (QID) | ORAL | Status: DC | PRN

## 2016-03-05 MED ORDER — LEVOFLOXACIN 500 MG PO TABS: 500.0000 mg | ORAL_TABLET | Freq: Every day | ORAL | 0 refills | Status: DC

## 2016-03-05 NOTE — Care Management Important Message (Signed)
Important Message  Patient Details  Name: Russell Howard MRN: [id number] Date of Birth: 25-Aug-1934   Medicare Important Message Given:  Yes Initial signed IM printed from Epic and given to patient.   Katrina Stack, RN 03/05/2016, 11:01 AM

## 2016-03-05 NOTE — Care Management (Signed)
Physical therapy has recommended home PT.  contacted [facility] to determine if agency could provide service again.  Obtained order for home health physical therapy and reminded attending to include outpatient palliative care follow up in the discharge summary as patient was currently being followed

## 2016-03-05 NOTE — Progress Notes (Signed)
MEDICATION RELATED CONSULT NOTE - Follow up  Pharmacy Consult for electrolyte replacement Indication:   Allergies  Allergen Reactions  . Penicillin G Hives    Has patient had a PCN reaction causing immediate rash, facial/tongue/throat swelling, SOB or lightheadedness with hypotension: Yes Has patient had a PCN reaction causing severe rash involving mucus membranes or skin necrosis: No Has patient had a PCN reaction that required hospitalization No Has patient had a PCN reaction occurring within the last 10 years: No If all of the above answers are "NO", then may proceed with Cephalosporin use.  . Sulfa Antibiotics Rash    Patient Measurements: Height: '5\' 10"'$  (177.8 cm) Weight: 175 lb 6.4 oz (79.6 kg) IBW/kg (Calculated) : 73 Adjusted Body Weight: 77kg  Vital Signs: Temp: 98 F (36.7 C) (01/31 1113) Temp Source: Oral (01/31 1113) BP: 127/61 (01/31 1113) Pulse Rate: 91 (01/31 1113) Intake/Output from previous day: 01/30 0701 - 01/31 0700 In: 1920 [P.O.:720; IV Piggyback:1200] Out: 1850 [Urine:1850] Intake/Output from this shift: Total I/O In: 240 [P.O.:240] Out: -   Labs:  Recent Labs  03/02/16 2000 03/03/16 0401 03/04/16 0456 03/05/16 0555  WBC 17.1* 17.6*  --  7.3  HGB 12.8* 11.4*  --  11.7*  HCT 39.9* 33.8*  --  34.4*  PLT 236 159  --  160  CREATININE 1.17 1.00 0.89 0.85  MG  --  1.5* 2.0 1.9  PHOS  --  2.9  --   --   ALBUMIN 3.9  --   --   --   PROT 8.1  --   --   --   AST 30  --   --   --   ALT 11*  --   --   --   ALKPHOS 78  --   --   --   BILITOT 0.7  --   --   --    Estimated Creatinine Clearance: 72.8 mL/min (by C-G formula based on SCr of 0.85 mg/dL).    Assessment: 82 y/o M with a h/o atrial fibrillation, pulmonary fibrosis, and CHF s/p defibrillator placement admitted with septic shock.   Plan:  No supplementation needed at this time.  Recheck labs in 48 hours.  [name] D 03/05/2016,2:21 PM

## 2016-03-05 NOTE — Evaluation (Signed)
Physical Therapy Evaluation Patient Details Name: Russell Howard MRN: [id number] DOB: 06-16-1934 Today's Date: 03/05/2016   History of Present Illness  presented to ER seconadary to L flank, abdominal pain; admitted with sepsis related to acute cystitis, R pleural effusion and severe pulmonary fibrosis.  Clinical Impression  Upon evaluation, patient alert and oriented; generally disinterested in PT evaluation, but agreeable to participate/complete as a step closer to going home.  Bilat UE/LE strength and ROM grossly symmetrical and WFL for basic transfers and mobility; unable to achieve full bilat knee extension due to baseline, chronic arthritic changes. Currently requiring min assist for bed mobility, sit/stand, basic transfers and short-distance gait (5') with RW.  Notably fatigued with minimal activity (sats >92% on 2L), but no overt buckling or LOB noted.  Fair awareness of activity pacing and mobility limitations.   Feels performance is near baseline, "just weaker"; daughter in law present throughout session to confirm. Would benefit from skilled PT to address above deficits and promote optimal return to PLOF; Recommend transition to [facility] upon discharge from acute hospitalization.     Follow Up Recommendations Home health PT (home safety assessment)    Equipment Recommendations       Recommendations for Other Services       Precautions / Restrictions Precautions Precautions: Fall;ICD/Pacemaker Restrictions Weight Bearing Restrictions: No      Mobility  Bed Mobility Overal bed mobility: Needs Assistance Bed Mobility: Supine to Sit     Supine to sit: Min assist     General bed mobility comments: for truncal elevation  Transfers Overall transfer level: Needs assistance Equipment used: Rolling walker (2 wheeled) Transfers: Sit to/from Stand Sit to Stand: Min assist;Min guard         General transfer comment: cuing for hand placement; decreased functional  power/muscular endurance noted in bilat LEs  Ambulation/Gait Ambulation/Gait assistance: Min guard;Min assist Ambulation Distance (Feet): 40 Feet Assistive device: Rolling walker (2 wheeled)     Gait velocity interpretation: Below normal speed for age/gender General Gait Details: very forward flexed posture with sustained flexion at bilat hips/knees; partially reciprocal stepping pattern with fair step height/length; slow and guarded, but no overt buckling or LOB  Stairs            Wheelchair Mobility    Modified Rankin (Stroke Patients Only)       Balance Overall balance assessment: Needs assistance Sitting-balance support: No upper extremity supported;Feet supported Sitting balance-Leahy Scale: Good     Standing balance support: Bilateral upper extremity supported Standing balance-Leahy Scale: Fair                               Pertinent Vitals/Pain Pain Assessment: No/denies pain    Home Living Family/patient expects to be discharged to:: Private residence Living Arrangements: Children Available Help at Discharge: Family;Available 24 hours/day Type of Home: House Home Access: Ramped entrance     Home Layout: One level Home Equipment: Walker - 2 wheels;Wheelchair - manual      Prior Function Level of Independence: Needs assistance         Comments: Assist as needed from daughter in law for ADLs, limited household mobility; utilizes manual WC (pushed by family) for community distances.  Home O2 at 2L continuously; denies fall history.     Hand Dominance        Extremity/Trunk Assessment   Upper Extremity Assessment Upper Extremity Assessment: Overall WFL for tasks assessed    Lower  Extremity Assessment Lower Extremity Assessment: Overall WFL for tasks assessed (grossly at least 4-/5 throughout, functional for basic transfers/gait; unable to achieve full bilat knee extension (chronic arthritic changes))       Communication    Communication: No difficulties  Cognition Arousal/Alertness: Awake/alert Behavior During Therapy: WFL for tasks assessed/performed Overall Cognitive Status: Within Functional Limits for tasks assessed                 General Comments: generally disinterested in therapy, but agreeable as a step closer to discharging home    General Comments      Exercises Other Exercises Other Exercises: Sit/stand with RW, min assist, x3-4 reps-cuing for hand placement to maximize safety/indep; multiple attempts required to complete as fatigue increased   Assessment/Plan    PT Assessment Patient needs continued PT services  PT Problem List Decreased strength;Decreased range of motion;Decreased activity tolerance;Decreased balance;Decreased mobility;Decreased cognition;Decreased knowledge of use of DME;Decreased safety awareness;Decreased knowledge of precautions;Cardiopulmonary status limiting activity          PT Treatment Interventions DME instruction;Gait training;Functional mobility training;Therapeutic activities;Therapeutic exercise;Balance training;Patient/family education    PT Goals (Current goals can be found in the Care Plan section)  Acute Rehab PT Goals Patient Stated Goal: to return home PT Goal Formulation: With patient/family Time For Goal Achievement: 03/19/16 Potential to Achieve Goals: Fair    Frequency Min 2X/week   Barriers to discharge        Co-evaluation               End of Session Equipment Utilized During Treatment: Gait belt;Oxygen Activity Tolerance: Patient limited by fatigue Patient left: in bed;with call bell/phone within reach;with bed alarm set;with family/visitor present Nurse Communication: Mobility status         Time: 8250-0370 PT Time Calculation (min) (ACUTE ONLY): 21 min   Charges:   PT Evaluation $PT Eval Low Complexity: 1 Procedure PT Treatments $Therapeutic Activity: 8-22 mins   PT G Codes:       [place] Rosamond H. Owens Shark, [place],  DPT, NCS 03/05/16, 10:54 AM [phone]

## 2016-03-05 NOTE — Progress Notes (Signed)
New referral for [facility] home health services of Physical Therapy received from [place]. Patient was admitted on 1/28 with sepsis secondary to acute cystitis, right pleural effusion and severe pulmonary fibrosis. He has received IV antibiotics and has improved, remains on his baseline 2 liters of oxygen. Pateint has received [facility] services in the past. Patient discharged prior to Liaison visit. Patient information, [facility] and home health orders  faxed to referral. Patient will also continue to receive home Palliative services. Thank you.  Flo Shanks RN, BSN, [facility], hospital liaison [phone] c

## 2016-03-05 NOTE — Discharge Instructions (Addendum)
Nausea and Vomiting, Adult Nausea is the feeling that you have an upset stomach or have to vomit. As nausea gets worse, it can lead to vomiting. Vomiting occurs when stomach contents are thrown up and out of the mouth. Vomiting can make you feel weak and cause you to become dehydrated. Dehydration can make you tired and thirsty, cause you to have a dry mouth, and decrease how often you urinate. Older adults and people with other diseases or a weak immune system are at higher risk for dehydration. It is important to treat your nausea and vomiting as told by your health care provider. Follow these instructions at home: Follow instructions from your health care provider about how to care for yourself at home. Eating and drinking Follow these recommendations as told by your health care provider:  Take an oral rehydration solution (ORS). This is a drink that is sold at pharmacies and retail stores.  Drink clear fluids in small amounts as you are able. Clear fluids include water, ice chips, diluted fruit juice, and low-calorie sports drinks.  Eat bland, easy-to-digest foods in small amounts as you are able. These foods include bananas, applesauce, rice, lean meats, toast, and crackers.  Avoid fluids that contain a lot of sugar or caffeine, such as energy drinks, sports drinks, and soda.  Avoid alcohol.  Avoid spicy or fatty foods. General instructions  Drink enough fluid to keep your urine clear or pale yellow.  Wash your hands often. If soap and water are not available, use hand sanitizer.  Make sure that all people in your household wash their hands well and often.  Take over-the-counter and prescription medicines only as told by your health care provider.  Rest at home while you recover.  Watch your condition for any changes.  Breathe slowly and deeply when you feel nauseated.  Keep all follow-up visits as told by your health care provider. This is important. Contact a health care  provider if:  You have a fever.  You cannot keep fluids down.  Your symptoms get worse.  You have new symptoms.  Your nausea does not go away after two days.  You feel light-headed or dizzy.  You have a headache.  You have muscle cramps. Get help right away if:  You have pain in your chest, neck, arm, or jaw.  You feel extremely weak or you faint.  You have persistent vomiting.  You see blood in your vomit.  Your vomit looks like black coffee grounds.  You have bloody or black stools or stools that look like tar.  You have a severe headache, a stiff neck, or both.  You have a rash.  You have severe pain, cramping, or bloating in your abdomen.  You have trouble breathing or you are breathing very quickly.  Your heart is beating very quickly.  Your skin feels cold and clammy.  You feel confused.  You have pain when you urinate.  You have signs of dehydration, such as:  Dark urine, very little urine, or no urine.  Cracked lips.  Dry mouth.  Sunken eyes.  Sleepiness.  Weakness. These symptoms may represent a serious problem that is an emergency. Do not wait to see if the symptoms will go away. Get medical help right away. Call your local emergency services (911 in the [place] Do not drive yourself to the hospital.  This information is not intended to replace advice given to you by your health care provider. Make sure you discuss any questions you  have with your health care provider. Document Released: 01/20/2005 Document Revised: 06/25/2015 Document Reviewed: 09/26/2014 Elsevier Interactive Patient Education  2017 [employer].   Follow-up with prima care physician Dr. Doy Hutching as scheduled on February 1 Urology Dr. Erlene Quan in a week Cardiology Dr. Ubaldo Glassing in a week Pulmonology Dr. Jamal Collin in 2 weeks

## 2016-03-05 NOTE — Discharge Summary (Signed)
[facility] NAME: Russell Howard    MR#:  [id number]  DATE OF BIRTH:  [birth year]  DATE OF ADMISSION:  03/02/2016 ADMITTING PHYSICIAN: Flora Lipps, MD  DATE OF DISCHARGE: 03/05/16 PRIMARY CARE PHYSICIAN: SPARKS,JEFFREY D, MD    ADMISSION DIAGNOSIS:  Abdominal pain [R10.9] Sepsis, due to unspecified organism ([place]) [A41.9]  DISCHARGE DIAGNOSIS:  Active Problems:   Septic shock ([place])   SECONDARY DIAGNOSIS:   Past Medical History:  Diagnosis Date  . Acquired hypothyroidism 11/02/2014  . BPH (benign prostatic hyperplasia)   . Cardiac defibrillator in place 11/02/2014  . Cardiomyopathy ([place])   . CHF (congestive heart failure) ([place])   . Chronic diastolic heart failure ([place]) 11/02/2014  . Gastro-esophageal reflux disease without esophagitis 11/02/2014  . H/O malignant neoplasm 11/09/2014  . Heart valve disease 11/02/2014  . History of atrial fibrillation 11/02/2014  . Lung cancer ([place])   . Neuropathy ([place]) 11/02/2014  . Orthostasis 11/02/2014  . Pure hypercholesterolemia 11/02/2014  . Valvular heart disease     HOSPITAL COURSE:   HISTORY OF PRESENT ILLNESS:   This is an 82 year old Caucasian male with a past medical history of hypothyroidism, BPH, cardiomyopathy, status post defibrillator placement, hypertension, congestive heart failure, GERD, atrial fibrillation on digoxin, hyperlipidemia and orthostatic hypotension who presented to the ED, left flank and abdominal pain and nausea and vomiting that started this evening. Patient states that he was in his usual state of health and went to church this evening when suddenly he started to shiver, had nausea and had some emesis. Onset of symptoms was sudden and the only associated symptom was chills. Per his daughter, patient had been complaining that he hasn't been feeling well for the last couple of days. He denies any sick contacts. He denies chest pain, palpitations, dizziness and  headaches. At the ED, she was severely hypotensive with blood pressure of 90/35 with a mean arterial place pressure of 50. His CT abdomen shows severe mural thickening at the anterior aspect of the urinary Bladder. He has a history of urinary retention but denies any symptoms currently He was hospitalized in December for hypoxemic respiratory failure, nausea, weakness and right loculated pleural effusion. He had a thoracentesis done during that admission and his symptoms improved. 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 Jamal Collin in 4 weeks Appreciate pulmonology recommendations  # Hx of lung cancer-follow-up with oncology as needed. Patient doesn't want to pursue chemotherapy for radiation therapies in future as he developed lung fibrosis from previous radiation therapy. Requesting palliative care to follow-up as an outpatient   #Severe sepsis - suspect PNA. Also mild pyuria so possible cystitis Hypotension resolved - was only briefly on norepinephrine Renal function improving All cx results NGTD. MRSA PCR is negative Will consider discharging the patient on levofloxacin at the time of discharge. Continue meropenem and discontinued vancomycin  # Malnutrition due to chronic illnesses.   dietary supplement  # History of urinary retention  #) Suspect pulmonary edema-continue Lasix   DISCHARGE CONDITIONS:   Stable   CONSULTS OBTAINED:     PROCEDURES   DRUG ALLERGIES:   Allergies  Allergen Reactions  . Penicillin G Hives    Has patient had a PCN reaction causing immediate rash, facial/tongue/throat swelling, SOB or lightheadedness with hypotension: Yes Has patient had  a PCN reaction causing severe rash involving mucus membranes or skin necrosis: No Has  patient had a PCN reaction that required hospitalization No Has patient had a PCN reaction occurring within the last 10 years: No If all of the above answers are "NO", then may proceed with Cephalosporin use.  . Sulfa Antibiotics Rash    DISCHARGE MEDICATIONS:   Current Discharge Medication List    START taking these medications   Details  acetaminophen (TYLENOL) 325 MG tablet Take 2 tablets (650 mg total) by mouth every 6 (six) hours as needed for mild pain (temp > 101.5).    levofloxacin (LEVAQUIN) 500 MG tablet Take 1 tablet (500 mg total) by mouth daily. Qty: 5 tablet, Refills: 0      CONTINUE these medications which have NOT CHANGED   Details  acidophilus (RISAQUAD) CAPS capsule Take 1 capsule by mouth daily.    Ascorbic Acid (VITAMIN C) 1000 MG tablet Take 500 mg by mouth 3 (three) times daily. Reported on 06/14/2015    aspirin EC 81 MG tablet Take 81 mg by mouth daily.     Cholecalciferol 5000 units TABS Take 5,000 Units by mouth every morning.    Coenzyme Q10 (TH CO Q-10) 100 MG capsule Take 100 mg by mouth daily.     DIGOX 250 MCG tablet Take 250 mcg by mouth daily.    furosemide (LASIX) 20 MG tablet Take 20 mg by mouth every other day as needed (as needed for water retention).    Associated Diagnoses: Cellulitis of right upper extremity    gabapentin (NEURONTIN) 600 MG tablet Take 600 mg by mouth 2 (two) times daily.     hydrocortisone (CORTEF) 10 MG tablet Take 10 mg by mouth 2 (two) times daily.    Associated Diagnoses: Cellulitis of right upper extremity    levothyroxine (SYNTHROID, LEVOTHROID) 50 MCG tablet Take 50 mcg by mouth daily before breakfast.    MAGNESIUM-OXIDE 400 (241.3 Mg) MG tablet Take 400 mg by mouth daily.    methenamine (HIPREX) 1 g tablet TAKE 1 TABLET(1 GRAM) BY MOUTH TWICE DAILY WITH A MEAL Qty: 60 tablet, Refills: 3   Associated Diagnoses: Recurrent UTI    midodrine (PROAMATINE) 5 MG tablet Take 5 mg by mouth 3 (three) times daily with  meals.    Multiple Vitamin (MULTIVITAMIN WITH MINERALS) TABS tablet Take 1 tablet by mouth daily.    pantoprazole (PROTONIX) 40 MG tablet Take 40 mg by mouth daily before breakfast.     potassium chloride (MICRO-K) 10 MEQ CR capsule Take 10 mEq by mouth daily as needed (only with lasix).     pyridostigmine (MESTINON) 60 MG tablet Take 60 mg by mouth 2 (two) times daily.    Associated Diagnoses: Cellulitis of right upper extremity    SAW PALMETTO-PUMPKIN SEED OIL PO Take 1 capsule by mouth daily.     simvastatin (ZOCOR) 20 MG tablet Take 20 mg by mouth daily at 6 PM.     vitamin B-12 (CYANOCOBALAMIN) 500 MCG tablet Take 500 mcg by mouth daily.     vitamin E 400 UNIT capsule Take 400 Units by mouth daily.     chlorpheniramine-HYDROcodone (TUSSIONEX) 10-8 MG/5ML SUER Take 5 mLs by mouth every 4 (four) hours as needed for cough. Qty: 140 mL, Refills: 0         DISCHARGE INSTRUCTIONS:   Follow-up with prima care physician Dr. Doy Hutching as scheduled on February 1 Urology Dr. Erlene Quan in a week Cardiology Dr. Ubaldo Glassing in a  week Pulmonology Dr. Jamal Collin in 2 weeks  DIET:  Regular diet  DISCHARGE CONDITION:  Fair  ACTIVITY:  Activity as tolerated  OXYGEN:  Home Oxygen: Yes.     Oxygen Delivery: 2 liters/min via Patient connected to nasal cannula oxygen  DISCHARGE LOCATION:  home   If you experience worsening of your admission symptoms, develop shortness of breath, life threatening emergency, suicidal or homicidal thoughts you must seek medical attention immediately by calling 911 or calling your MD immediately  if symptoms less severe.  You Must read complete instructions/literature along with all the possible adverse reactions/side effects for all the Medicines you take and that have been prescribed to you. Take any new Medicines after you have completely understood and accpet all the possible adverse reactions/side effects.   Please note  You were cared for by a hospitalist  during your hospital stay. If you have any questions about your discharge medications or the care you received while you were in the hospital after you are discharged, you can call the unit and asked to speak with the hospitalist on call if the hospitalist that took care of you is not available. Once you are discharged, your primary care physician will handle any further medical issues. Please note that NO REFILLS for any discharge medications will be authorized once you are discharged, as it is imperative that you return to your primary care physician (or establish a relationship with a primary care physician if you do not have one) for your aftercare needs so that they can reassess your need for medications and monitor your lab values.     Today  Chief Complaint  Patient presents with  . Emesis  . Abdominal Pain   Patient is feeling much better no fever, on 2 L of home oxygen wants to go home. Requesting palliative care follow-up outpatient, refused laxatives  ROS:  CONSTITUTIONAL: Denies fevers, chills. Denies any fatigue, weakness.  EYES: Denies blurry vision, double vision, eye pain. EARS, NOSE, THROAT: Denies tinnitus, ear pain, hearing loss. RESPIRATORY: Denies cough, wheeze, shortness of breath.  CARDIOVASCULAR: Denies chest pain, palpitations, edema.  GASTROINTESTINAL: Denies nausea, vomiting, diarrhea, abdominal pain. Denies bright red blood per rectum. GENITOURINARY: Denies dysuria, hematuria. ENDOCRINE: Denies nocturia or thyroid problems. HEMATOLOGIC AND LYMPHATIC: Denies easy bruising or bleeding. SKIN: Denies rash or lesion. MUSCULOSKELETAL: Denies pain in neck, back, shoulder, knees, hips or arthritic symptoms.  NEUROLOGIC: Denies paralysis, paresthesias.  PSYCHIATRIC: Denies anxiety or depressive symptoms.   VITAL SIGNS:  Blood pressure 127/61, pulse 91, temperature 98 F (36.7 C), temperature source Oral, resp. rate 18, height '5\' 10"'$  (1.778 m), weight 79.6 kg (175 lb  6.4 oz), SpO2 99 %.  I/O:    Intake/Output Summary (Last 24 hours) at 03/05/16 1616 Last data filed at 03/05/16 1500  Gross per 24 hour  Intake             1680 ml  Output             1900 ml  Net             -220 ml    PHYSICAL EXAMINATION:  GENERAL:  82 y.o.-year-old patient lying in the bed with no acute distress.  EYES: Pupils equal, round, reactive to light and accommodation. No scleral icterus. Extraocular muscles intact.  HEENT: Head atraumatic, normocephalic. Oropharynx and nasopharynx clear.  NECK:  Supple, no jugular venous distention. No thyroid enlargement, no tenderness.  LUNGS: Normal breath sounds bilaterally, no wheezing, rales,rhonchi or crepitation.  No use of accessory muscles of respiration.  CARDIOVASCULAR: S1, S2 normal. No murmurs, rubs, or gallops.  ABDOMEN: Soft, non-tender, non-distended. Bowel sounds present. No organomegaly or mass.  EXTREMITIES: No pedal edema, cyanosis, or clubbing.  NEUROLOGIC: Cranial nerves II through XII are intact. Muscle strength 5/5 in all extremities. Sensation intact. Gait not checked.  PSYCHIATRIC: The patient is alert and oriented x 3.  SKIN: No obvious rash, lesion, or ulcer.   DATA REVIEW:   CBC  Recent Labs Lab 03/05/16 0555  WBC 7.3  HGB 11.7*  HCT 34.4*  PLT 160    Chemistries   Recent Labs Lab 03/02/16 2000  03/05/16 0555  NA 139  < > 139  K 3.8  < > 4.3  CL 102  < > 104  CO2 22  < > 29  GLUCOSE 163*  < > 111*  BUN 25*  < > 14  CREATININE 1.17  < > 0.85  CALCIUM 9.0  < > 9.0  MG  --   < > 1.9  AST 30  --   --   ALT 11*  --   --   ALKPHOS 78  --   --   BILITOT 0.7  --   --   < > = values in this interval not displayed.  Cardiac Enzymes  Recent Labs Lab 03/03/16 0401  TROPONINI 0.04*    Microbiology Results  Results for orders placed or performed during the hospital encounter of 03/02/16  Culture, blood (Routine X 2) w Reflex to ID Panel     Status: None (Preliminary result)    Collection Time: 03/02/16  8:00 PM  Result Value Ref Range Status   Specimen Description BLOOD BLOOD LEFT WRIST  Final   Special Requests   Final    BOTTLES DRAWN AEROBIC AND ANAEROBIC AER 2CC, ANA 3CC   Culture NO GROWTH 3 DAYS  Final   Report Status PENDING  Incomplete  Culture, blood (Routine X 2) w Reflex to ID Panel     Status: Abnormal   Collection Time: 03/02/16  8:00 PM  Result Value Ref Range Status   Specimen Description BLOOD BLOOD RIGHT ARM  Final   Special Requests   Final    BOTTLES DRAWN AEROBIC AND ANAEROBIC AER 11CC, ANA Howard   Culture  Setup Time   Final    GRAM POSITIVE COCCI IN BOTH AEROBIC AND ANAEROBIC BOTTLES CRITICAL RESULT CALLED TO, READ BACK BY AND VERIFIED WITH: NATE COOKSON ON 03/04/16 AT 0125 BY TLB    Culture (A)  Final    STAPHYLOCOCCUS SPECIES (COAGULASE NEGATIVE) THE SIGNIFICANCE OF ISOLATING THIS ORGANISM FROM A SINGLE SET OF BLOOD CULTURES WHEN MULTIPLE SETS ARE DRAWN IS UNCERTAIN. PLEASE NOTIFY THE MICROBIOLOGY DEPARTMENT WITHIN ONE WEEK IF SPECIATION AND SENSITIVITIES ARE REQUIRED. Performed at [facility], [place] [address], [place], [place] [address]    Report Status 03/05/2016 FINAL  Final  Blood Culture ID Panel (Reflexed)     Status: Abnormal   Collection Time: 03/02/16  8:00 PM  Result Value Ref Range Status   Enterococcus species NOT DETECTED NOT DETECTED Final   Listeria monocytogenes NOT DETECTED NOT DETECTED Final   Staphylococcus species DETECTED (A) NOT DETECTED Final    Comment: CRITICAL RESULT CALLED TO, READ BACK BY AND VERIFIED WITH: NATE COOKSON ON 03/04/16 AT 0125 BY TLB    Staphylococcus aureus NOT DETECTED NOT DETECTED Final   Methicillin resistance DETECTED (A) NOT DETECTED Final  Comment: CRITICAL RESULT CALLED TO, READ BACK BY AND VERIFIED WITH: NATE COOKSON ON 03/04/16 AT 0125 BY TLB    Streptococcus species NOT DETECTED NOT DETECTED Final   Streptococcus agalactiae NOT DETECTED NOT DETECTED Final   Streptococcus  pneumoniae NOT DETECTED NOT DETECTED Final   Streptococcus pyogenes NOT DETECTED NOT DETECTED Final   Acinetobacter baumannii NOT DETECTED NOT DETECTED Final   Enterobacteriaceae species NOT DETECTED NOT DETECTED Final   Enterobacter cloacae complex NOT DETECTED NOT DETECTED Final   Escherichia coli NOT DETECTED NOT DETECTED Final   Klebsiella oxytoca NOT DETECTED NOT DETECTED Final   Klebsiella pneumoniae NOT DETECTED NOT DETECTED Final   Proteus species NOT DETECTED NOT DETECTED Final   Serratia marcescens NOT DETECTED NOT DETECTED Final   Haemophilus influenzae NOT DETECTED NOT DETECTED Final   Neisseria meningitidis NOT DETECTED NOT DETECTED Final   Pseudomonas aeruginosa NOT DETECTED NOT DETECTED Final   Candida albicans NOT DETECTED NOT DETECTED Final   Candida glabrata NOT DETECTED NOT DETECTED Final   Candida krusei NOT DETECTED NOT DETECTED Final   Candida parapsilosis NOT DETECTED NOT DETECTED Final   Candida tropicalis NOT DETECTED NOT DETECTED Final  Urine culture     Status: None   Collection Time: 03/02/16 10:35 PM  Result Value Ref Range Status   Specimen Description URINE, RANDOM  Final   Special Requests NONE  Final   Culture   Final    NO GROWTH Performed at [facility], [address], [place], [place] [address]    Report Status 03/04/2016 FINAL  Final  MRSA PCR Screening     Status: None   Collection Time: 03/02/16 11:43 PM  Result Value Ref Range Status   MRSA by PCR NEGATIVE NEGATIVE Final    Comment:        The GeneXpert MRSA Assay (FDA approved for NASAL specimens only), is one component of a comprehensive MRSA colonization surveillance program. It is not intended to diagnose MRSA infection nor to guide or monitor treatment for MRSA infections.     RADIOLOGY:  Dg Chest Port 1 View  Result Date: 03/02/2016 CLINICAL DATA:  Right-sided central line. EXAM: PORTABLE CHEST 1 VIEW COMPARISON:  03/02/2016 at 20:04 FINDINGS: New right subclavian  central line reaches the central circulation, extending into the low SVC and then curving back on itself with the tip at approximately the level of the azygos vein junction. No pneumothorax. No other interval change. Persistent right lung consolidation and right hemidiaphragm elevation. Persistent diffuse interstitial coarsening. IMPRESSION: The new right-sided central line reaches the central circulation but it is curved back on itself as detailed above. No pneumothorax. Electronically Signed   By: Andreas Newport M.D.   On: 03/02/2016 22:25   Dg Chest Port 1 View  Result Date: 03/02/2016 CLINICAL DATA:  Shortness of breath and vomiting. EXAM: PORTABLE CHEST 1 VIEW COMPARISON:  01/09/2016 and prior chest radiographs.  11/10/2015 CT FINDINGS: Cardiomegaly and cardiac valve replacement changes noted. A left-sided pacemaker/ ICD again noted. Right hemithorax volume loss, loculated right pleural effusion and right lung consolidation/airspace disease has not significantly changed. Mild left lung interstitial prominence again noted. There is no evidence of pneumothorax or left pleural effusion. No acute bony abnormalities are noted. IMPRESSION: No evidence of acute abnormality. Unchanged right hemithorax volume loss, loculated effusion and right lung consolidation/ airspace disease. Electronically Signed   By: Margarette Canada M.D.   On: 03/02/2016 20:37   Ct Angio Abd/pel W/ And/or W/o  Result Date: 03/02/2016 CLINICAL DATA:  Left lower abdominal pain, onset at 18:30 tonight. EXAM: CTA ABDOMEN AND PELVIS wITHOUT AND WITH CONTRAST TECHNIQUE: Multidetector CT imaging of the abdomen and pelvis was performed using the standard protocol during bolus administration of intravenous contrast. Multiplanar reconstructed images and MIPs were obtained and reviewed to evaluate the vascular anatomy. CONTRAST:  100 mL Isovue 370 intravenous COMPARISON:  01/13/2016 FINDINGS: VASCULAR Aorta: The abdominal aorta is normal in caliber  with extensive atherosclerotic calcification. No dissection or significant stenosis. Celiac: Heavily calcified at its origin but patent.  No aneurysm. SMA: Heavily calcified at its origin and probably stenotic. Cannot exclude a short focal occlusion within the heavily calcified proximal SMA. No aneurysm. Renals: Heavily calcified at their origins, possibly stenotic. No aneurysm or occlusion. IMA: Patent and normal in caliber. Inflow: Heavily calcified but normal in caliber and patent. Proximal Outflow: Extensive atherosclerotic calcification without aneurysm or occlusion. Veins: No obvious venous abnormality within the limitations of this arterial phase study. Review of the MIP images confirms the above findings. NON-VASCULAR Lower chest: Unchanged loculated right pleural collection. Unchanged fibrotic appearing interstitial coarsening in the bases. Hepatobiliary: No focal liver lesion. There is a 12 mm calculus in the gallbladder, unchanged. No gallbladder inflammation or bile duct dilatation. Pancreas: Unremarkable. No pancreatic ductal dilatation or surrounding inflammatory changes. Spleen: Normal in size without focal abnormality. Adrenals/Urinary Tract: Both adrenals are normal. Unchanged low-attenuation right renal lesions, probably cysts but too small for definitive characterization. Unremarkable collecting systems and ureters bilaterally. There is very prominent mural thickening at the anterior aspect of the urinary bladder, and this has worsened from 01/13/2016. Cannot exclude neoplasm, but the rapid worsening of mural thickening is more consistent with infection or inflammation. Stomach/Bowel: Hiatal hernia. Small bowel is unremarkable. Colon is unremarkable. No acute bowel inflammation. No bowel obstruction or perforation. Uncomplicated mild colonic diverticulosis. Lymphatic: No adenopathy in the abdomen or pelvis. Reproductive: Unremarkable Other: No focal inflammation.  No ascites. Musculoskeletal: No  significant skeletal lesion. IMPRESSION: VASCULAR Diffuse atherosclerotic calcification of the aorta and its major branches. The SMA origin is probably stenotic, and a very short focal occlusion cannot be entirely excluded although this does not appear significantly different from 01/13/2016. No acute vascular abnormality is evident. NON-VASCULAR 1. Marked mural thickening at the anterior aspect of the urinary bladder, worsened from 01/13/2016. Inflammation or infection is more likely than neoplasm given the rapid changes. 2. Cholelithiasis. 3. Hiatal hernia. 4. Uncomplicated mild colonic diverticulosis. Electronically Signed   By: Andreas Newport M.D.   On: 03/02/2016 21:59    EKG:   Orders placed or performed during the hospital encounter of 03/02/16  . EKG 12-Lead  . EKG 12-Lead  . EKG      Management plans discussed with the patient, family and they are in agreement.  CODE STATUS:     Code Status Orders        Start     Ordered   03/02/16 2224  Do not attempt resuscitation (DNR)  Continuous    Question Answer Comment  In the event of cardiac or respiratory ARREST Do not call a "code blue"   In the event of cardiac or respiratory ARREST Do not perform Intubation, CPR, defibrillation or ACLS   In the event of cardiac or respiratory ARREST Use medication by any route, position, wound care, and other measures to relive pain and suffering. May use oxygen, suction and manual treatment of airway obstruction as needed for comfort.  03/02/16 2236    Code Status History    Date Active Date Inactive Code Status Order ID Comments User Context   01/09/2016  6:58 AM 01/14/2016  7:39 PM DNR [id number]  Harrie Foreman, MD Inpatient   11/09/2015  3:18 AM 11/12/2015  5:13 PM DNR [id number]  Harrie Foreman, MD Inpatient   06/14/2015  4:39 PM 06/18/2015  3:13 PM DNR [id number]  Demetrios Loll, MD Inpatient    Advance Directive Documentation   Flowsheet Row Most Recent Value  Type of Advance  Directive  Healthcare Power of Attorney  Pre-existing out of facility DNR order (yellow form or pink MOST form)  No data  "MOST" Form in Place?  No data      TOTAL TIME TAKING CARE OF THIS PATIENT: 45 minutes.   Note: This dictation was prepared with Dragon dictation along with smaller phrase technology. Any transcriptional errors that result from this process are unintentional.   '@[facility]'$ @  on 03/05/2016 at 4:16 PM  Between 7am to 6pm - Pager - [phone]  After 6pm go to [email or web address] - password [email or web address] [facility]  Office  [phone]  CC: Primary care physician; [name], MD

## 2016-03-07 LAB — CULTURE, BLOOD (ROUTINE X 2): Culture: NO GROWTH

## 2016-03-14 ENCOUNTER — Ambulatory Visit: Payer: Medicare Other | Admitting: Urology

## 2016-03-19 ENCOUNTER — Ambulatory Visit (INDEPENDENT_AMBULATORY_CARE_PROVIDER_SITE_OTHER): Payer: Medicare Other | Admitting: Urology

## 2016-03-19 ENCOUNTER — Encounter: Payer: Self-pay | Admitting: Urology

## 2016-03-19 VITALS — BP 110/68 | HR 68 | Ht 70.0 in | Wt 174.0 lb

## 2016-03-19 DIAGNOSIS — Z87448 Personal history of other diseases of urinary system: Secondary | ICD-10-CM | POA: Diagnosis not present

## 2016-03-19 DIAGNOSIS — R339 Retention of urine, unspecified: Secondary | ICD-10-CM | POA: Diagnosis not present

## 2016-03-19 DIAGNOSIS — N390 Urinary tract infection, site not specified: Secondary | ICD-10-CM

## 2016-03-19 NOTE — Progress Notes (Signed)
03/19/2016 4:18 PM   Russell Howard September 01, 1934 [id number]  Referring provider: [name], MD [facility] [place], [place] [address]  Chief Complaint  Patient presents with  . Recurrent UTI    65monthfollow up  . Urinary Retention    HPI: 82year old male with stage III lung cancer, urinary retention on clean intermittent catheterization, and recurrent urinary tract infections.  Urinary retention Patient has been successful clean intermittent catheterization since his failed void trial He has been successful at catheterizing himself twice daily with excellent technique Void intermittently throughout the day but has fairly significant post void residual with each catheterization Overall, no significant urinary complaints today. Nocturia 1.  History of urethral stricture S/p in office urethral dilation by Dr. HElnoria Howard10/2016 with filiform and followers  Recurrent UTIs Several admissions over the last 6 months but none related to UTIs, primarily pulmonary in origin. Generally, he is had no significant associated urinary symptoms with the infection other than on 1 single occasion (urinary frequency/lower abdominal pressure).    Currently on probiotic and hipprex for UTI prevention which seems to be helping.  Daughter reports today that is often frustrating and difficult to assess whether or not he has an infection. He does not have typical symptoms of infection including no urinary symptoms, fevers, tachycardia, or hypotension. He is chronically hypotensive and tachycardic. As such, she has difficulty defining parameters. He has presented to the emergency room now on several occasions with florid sepsis.  Today, he denies any UTI symptoms.   PMH: Past Medical History:  Diagnosis Date  . Acquired hypothyroidism 11/02/2014  . BPH (benign prostatic hyperplasia)   . Cardiac defibrillator in place 11/02/2014  . Cardiomyopathy ([facility]   . CHF (congestive  heart failure) ([facility]   . Chronic diastolic heart failure ([place] 11/02/2014  . Gastro-esophageal reflux disease without esophagitis 11/02/2014  . H/O malignant neoplasm 11/09/2014  . Heart valve disease 11/02/2014  . History of atrial fibrillation 11/02/2014  . Lung cancer ([facility]   . Neuropathy ([facility] 11/02/2014  . Orthostasis 11/02/2014  . Pure hypercholesterolemia 11/02/2014  . Valvular heart disease     Surgical History: Past Surgical History:  Procedure Laterality Date  . APPENDECTOMY    . DUAL ICD IMPLANT  2007/2009   implantable cardiac defibrillator  . PORT-A-CATH REMOVAL      Home Medications:  Allergies as of 03/19/2016      Reactions   Penicillin G Hives   Has patient had a PCN reaction causing immediate rash, facial/tongue/throat swelling, SOB or lightheadedness with hypotension: Yes Has patient had a PCN reaction causing severe rash involving mucus membranes or skin necrosis: No Has patient had a PCN reaction that required hospitalization No Has patient had a PCN reaction occurring within the last 10 years: No If all of the above answers are "NO", then may proceed with Cephalosporin use.   Sulfa Antibiotics Rash      Medication List       Accurate as of 03/19/16 11:59 PM. Always use your most recent med list.          acetaminophen 325 MG tablet Commonly known as:  TYLENOL Take 2 tablets (650 mg total) by mouth every 6 (six) hours as needed for mild pain (temp > 101.5).   acidophilus Caps capsule Take 1 capsule by mouth daily.   aspirin EC 81 MG tablet Take 81 mg by mouth daily.   Cholecalciferol 5000 units Tabs Take 5,000 Units by mouth every  morning.   DIGOX 0.25 MG tablet Generic drug:  digoxin Take 250 mcg by mouth daily.   furosemide 20 MG tablet Commonly known as:  LASIX Take 20 mg by mouth every other day as needed (as needed for water retention).   gabapentin 600 MG tablet Commonly known as:  NEURONTIN Take 600 mg by mouth 2 (two) times daily.     hydrocortisone 10 MG tablet Commonly known as:  CORTEF Take 10 mg by mouth 2 (two) times daily.   levothyroxine 50 MCG tablet Commonly known as:  SYNTHROID, LEVOTHROID Take 50 mcg by mouth daily before breakfast.   MAGNESIUM-OXIDE 400 (241.3 Mg) MG tablet Generic drug:  magnesium oxide Take 400 mg by mouth daily.   methenamine 1 g tablet Commonly known as:  HIPREX TAKE 1 TABLET(1 GRAM) BY MOUTH TWICE DAILY WITH A MEAL   midodrine 5 MG tablet Commonly known as:  PROAMATINE Take 5 mg by mouth 3 (three) times daily with meals.   multivitamin with minerals Tabs tablet Take 1 tablet by mouth daily.   pantoprazole 40 MG tablet Commonly known as:  PROTONIX Take 40 mg by mouth daily before breakfast.   potassium chloride 10 MEQ CR capsule Commonly known as:  MICRO-K Take 10 mEq by mouth daily as needed (only with lasix).   pyridostigmine 60 MG tablet Commonly known as:  MESTINON Take 60 mg by mouth 2 (two) times daily.   SAW PALMETTO-PUMPKIN SEED OIL PO Take 1 capsule by mouth daily.   simvastatin 20 MG tablet Commonly known as:  ZOCOR Take 20 mg by mouth daily at 6 PM.   TH CO Q-10 100 MG capsule Generic drug:  Coenzyme Q10 Take 100 mg by mouth daily.   vitamin B-12 500 MCG tablet Commonly known as:  CYANOCOBALAMIN Take 500 mcg by mouth daily.   vitamin C 1000 MG tablet Take 500 mg by mouth 3 (three) times daily. Reported on 06/14/2015   vitamin E 400 UNIT capsule Take 400 Units by mouth daily.       Allergies:  Allergies  Allergen Reactions  . Penicillin G Hives    Has patient had a PCN reaction causing immediate rash, facial/tongue/throat swelling, SOB or lightheadedness with hypotension: Yes Has patient had a PCN reaction causing severe rash involving mucus membranes or skin necrosis: No Has patient had a PCN reaction that required hospitalization No Has patient had a PCN reaction occurring within the last 10 years: No If all of the above answers are  "NO", then may proceed with Cephalosporin use.  . Sulfa Antibiotics Rash    Family History: Family History  Problem Relation Age of Onset  . Hypertension    . Heart disease      Social History:  reports that he quit smoking about 34 years ago. His smoking use included Cigarettes. He has never used smokeless tobacco. He reports that he does not drink alcohol or use drugs.  ROS: UROLOGY Frequent Urination?: No Hard to postpone urination?: No Burning/pain with urination?: No Get up at night to urinate?: No Leakage of urine?: No Urine stream starts and stops?: No Trouble starting stream?: Yes Do you have to strain to urinate?: Yes Blood in urine?: No Urinary tract infection?: No Sexually transmitted disease?: No Injury to kidneys or bladder?: No Painful intercourse?: No Weak stream?: No Erection problems?: No Penile pain?: No  Gastrointestinal Nausea?: No Vomiting?: No Indigestion/heartburn?: No Diarrhea?: No Constipation?: No  Constitutional Fever: No Night sweats?: No Weight loss?: No Fatigue?:  Yes  Skin Skin rash/lesions?: No Itching?: No  Eyes Blurred vision?: No Double vision?: No  Ears/Nose/Throat Sore throat?: No Sinus problems?: Yes  Hematologic/Lymphatic Swollen glands?: No Easy bruising?: No  Cardiovascular Leg swelling?: No Chest pain?: No  Respiratory Cough?: Yes Shortness of breath?: Yes  Endocrine Excessive thirst?: No  Musculoskeletal Back pain?: No Joint pain?: No  Neurological Headaches?: No Dizziness?: No  Psychologic Depression?: No Anxiety?: No  Physical Exam: BP 110/68   Pulse 68   Ht '5\' 10"'$  (1.778 m)   Wt 174 lb (78.9 kg)   BMI 24.97 kg/m   Constitutional:  Alert and oriented, No acute distress.  Presents with daughter.  In wheelchair. HEENT: [place] AT, moist mucus membranes.  Trachea midline, no masses. Cardiovascular: No clubbing, cyanosis, or edema. Respiratory: Normal respiratory effort, no increased work  of breathing.  Skin: No rashes, bruises or suspicious lesions. Neurologic: Grossly intact, no focal deficits, moving all 4 extremities. Psychiatric: Normal mood and affect.  Laboratory Data: Lab Results  Component Value Date   WBC 7.3 03/05/2016   HGB 11.7 (L) 03/05/2016   HCT 34.4 (L) 03/05/2016   MCV 88.3 03/05/2016   PLT 160 03/05/2016    Lab Results  Component Value Date   CREATININE 0.85 03/05/2016   Urinalysis Will defer checking urinalysis today given likely chronic colonization and lack of symptoms.  Assessment & Plan:    1. Recurrent UTI Continue methenamine for urinary acidification and probiotic Reviewed importance of antibiotic stewardship and only treating ONLY if symptomatic Typical signs and symptoms are reviewed today, understand his frustration in lack of warning signs in advance of sepsis Return to our office if has signs or symptoms of infection for UA/ UCx (catheterized)   2. Urinary retention Continue CIC bid, volumes currently range from 300-500 Advised to increase frequency of Foley and to increase  Generally, recommend upper tract imaging and cystoscopy annually for those on clean intermittent catheterization or chronic indwelling Foley- range for cystoscopy and next visit  3. History of urethral stricture As above, CIC to keep urethral patient   Return in about 1 year (around 03/19/2017) for cysto/ symptoms recheck.  Hollice Espy, MD  [facility] [address], [place], [place] [address] [phone]

## 2016-03-21 ENCOUNTER — Ambulatory Visit (INDEPENDENT_AMBULATORY_CARE_PROVIDER_SITE_OTHER): Payer: Medicare Other | Admitting: Pulmonary Disease

## 2016-03-21 ENCOUNTER — Encounter: Payer: Self-pay | Admitting: Pulmonary Disease

## 2016-03-21 VITALS — BP 132/70 | HR 114 | Wt 173.0 lb

## 2016-03-21 DIAGNOSIS — Z87891 Personal history of nicotine dependence: Secondary | ICD-10-CM

## 2016-03-21 DIAGNOSIS — J9611 Chronic respiratory failure with hypoxia: Secondary | ICD-10-CM

## 2016-03-21 DIAGNOSIS — J8410 Pulmonary fibrosis, unspecified: Secondary | ICD-10-CM | POA: Diagnosis not present

## 2016-03-21 DIAGNOSIS — Z85118 Personal history of other malignant neoplasm of bronchus and lung: Secondary | ICD-10-CM | POA: Diagnosis not present

## 2016-03-21 NOTE — Patient Instructions (Addendum)
Continue oxygen therapy as prescribed We will work on obtaining a oxygen concentrator device that meets your - transportable  Follow up in 3 months with chest Xray

## 2016-03-23 NOTE — Progress Notes (Signed)
PULMONARY OFFICE POST HOSPITAL OFFICE FOLLOW NOTE   PROBLEMS:  History of lung cancer, S/P XRT to R lung 2015 Extensive R lung radiation fibrosis Chronic hypoxemic respiratory failure Hospitalized 01/28-01/31/18 for severe sepsis and presumed PNA  DATA: CT chest 11/10/15: Persistent volume loss, prominent fibrosis, bronchiectasis and interstitial infiltrates in the right upper lobe, with similar but less severe changes in the remaining of the right lung parenchyma.  Increasing perifissural and peripleural nodular and linear interstitial thickening in the left lung.  INTERVAL HISTORY: No major events since discharge  SUBJ: Feels like he is nearly back to his baseline. He is markedly limited by DOE and weakness at his baseline. Has completed antibiotics and steroids. He is not on any chronic bronchodilator therapy as he believes these didn't help and even might have made his breathing worse. He wears chronic O2 @ 2 lpm continuously. He wishes to obtain portable oxygen concentrator. Denies CP, fever, purulent sputum, hemoptysis, LE edema and calf tenderness  OBJ: Vitals:   03/21/16 1106 03/21/16 1107  BP:  132/70  Pulse:  (!) 114  SpO2:  95%  Weight: 173 lb (78.5 kg)     Gen: WDWN in NAD HEENT: All WNL Neck: NO LAN, no JVD noted Lungs: BS markedly diminished on R with crackles throughout on R, no wheezes Cardiovascular: Reg rate and rhythm, no M  Abdomen: Soft, NT +BS Ext: no C/C/E Neuro: CNs intact, motor/sens grossly intact Skin: No lesions noted  DATA: No new CXR  IMPRESSION: Former smoker History of lung cancer, S/P XRT to R lung 2015 Extensive R lung radiation fibrosis Chronic hypoxemic respiratory failure Hospitalized 01/28-01/31/18 for severe sepsis and presumed PNA Previously designated DNR  PLAN: Continue oxygen therapy as prescribed We will work on obtaining a portable oxygen concentrator device  Follow up in 3 months with chest Xray   Merton Border,  MD [facility] service Mobile [phone] Pager [phone] 03/23/2016

## 2016-03-26 ENCOUNTER — Ambulatory Visit: Payer: Medicare Other | Admitting: Urology

## 2016-04-21 ENCOUNTER — Other Ambulatory Visit: Payer: Self-pay | Admitting: Urology

## 2016-04-21 DIAGNOSIS — N390 Urinary tract infection, site not specified: Secondary | ICD-10-CM

## 2016-04-23 ENCOUNTER — Ambulatory Visit: Payer: Medicare Other | Admitting: Urology

## 2016-06-03 ENCOUNTER — Other Ambulatory Visit: Payer: Self-pay | Admitting: Urology

## 2016-06-03 DIAGNOSIS — N390 Urinary tract infection, site not specified: Secondary | ICD-10-CM

## 2016-06-12 ENCOUNTER — Ambulatory Visit
Admission: RE | Admit: 2016-06-12 | Discharge: 2016-06-12 | Disposition: A | Discharge status: Home / Self Care | Payer: Medicare Other | Source: Ambulatory Visit | Attending: Pulmonary Disease | Admitting: Pulmonary Disease

## 2016-06-12 ENCOUNTER — Encounter: Payer: Self-pay | Admitting: Pulmonary Disease

## 2016-06-12 ENCOUNTER — Ambulatory Visit (INDEPENDENT_AMBULATORY_CARE_PROVIDER_SITE_OTHER): Payer: Medicare Other | Admitting: Pulmonary Disease

## 2016-06-12 VITALS — BP 128/58 | HR 104 | Ht 70.0 in | Wt 180.0 lb

## 2016-06-12 DIAGNOSIS — J9611 Chronic respiratory failure with hypoxia: Secondary | ICD-10-CM

## 2016-06-12 DIAGNOSIS — J8410 Pulmonary fibrosis, unspecified: Secondary | ICD-10-CM

## 2016-06-12 DIAGNOSIS — Z87891 Personal history of nicotine dependence: Secondary | ICD-10-CM | POA: Diagnosis not present

## 2016-06-12 DIAGNOSIS — J701 Chronic and other pulmonary manifestations due to radiation: Secondary | ICD-10-CM | POA: Diagnosis not present

## 2016-06-12 MED ORDER — IPRATROPIUM-ALBUTEROL 0.5-2.5 (3) MG/3ML IN SOLN: 3.0000 mL | Freq: Four times a day (QID) | RESPIRATORY_TRACT | 5 refills | Status: DC | PRN

## 2016-06-12 NOTE — Patient Instructions (Addendum)
Trial of nebulized DuoNeb - every 6 hours as needed for shortness of breath, cough, chest tightness, wheezing  We will contact [facility] to arrange for the delivery of a nebulizer machine Continue oxygen therapy as close to 24 hours per day as possible Follow-up in 4-6 months

## 2016-06-15 NOTE — Progress Notes (Signed)
PULMONARY OFFICE FOLLOW NOTE   PROBLEMS:  History of lung cancer, S/P XRT to R lung 2015 Extensive R lung radiation fibrosis Chronic hypoxemic respiratory failure Hospitalized 01/28-01/31/18 for severe sepsis and presumed PNA  DATA: CT chest 11/10/15: Persistent volume loss, prominent fibrosis, bronchiectasis and interstitial infiltrates in the right upper lobe, with similar but less severe changes in the remaining of the right lung parenchyma.  Increasing perifissural and peripleural nodular and linear interstitial thickening in the left lung.  INTERVAL HISTORY: No major events   SUBJ: No new complaints. All the way back to his prior baseline. Denies CP, fever, purulent sputum, hemoptysis, LE edema and calf tenderness  OBJ: Vitals:   06/12/16 1130 06/12/16 1131  BP:  (!) 128/58  Pulse:  (!) 104  SpO2:  96%  Weight: 180 lb (81.6 kg)   Height: '5\' 10"'$  (1.778 m)     Gen: WDWN in NAD HEENT: All WNL Neck: NO LAN, no JVD noted Lungs: BS markedly diminished on R with crackles, no wheezes Cardiovascular: Reg rate and rhythm, no M  Abdomen: Soft, NT +BS Ext: no C/C/E Neuro: CNs intact, motor/sens grossly intact Skin: No lesions noted  DATA: CXR 06/12/16: extensive fibrotic changes on R with improved aeration in RUL  IMPRESSION: Former smoker History of lung cancer, S/P XRT to R lung 2015 Extensive R lung radiation fibrosis Chronic hypoxemic respiratory failure Hospitalized 01/28-01/31/18 for severe sepsis and presumed PNA Previously designated DNR  PLAN: - Trial of nebulized DuoNeb - every 6 hours as needed for shortness of breath, cough, chest tightness, wheezing. We will contact [facility] to arrange nebulizer - Continue oxygen therapy as close to 24 hours per day as possible - Follow-up in 4-6 months  Merton Border, MD PCCM service Mobile [phone] Pager [phone] 06/15/2016

## 2016-06-18 ENCOUNTER — Other Ambulatory Visit: Payer: Self-pay | Admitting: Pulmonary Disease

## 2016-07-01 ENCOUNTER — Other Ambulatory Visit: Payer: Self-pay | Admitting: Urology

## 2016-07-01 DIAGNOSIS — N390 Urinary tract infection, site not specified: Secondary | ICD-10-CM

## 2016-07-30 ENCOUNTER — Other Ambulatory Visit: Payer: Self-pay | Admitting: Urology

## 2016-07-30 DIAGNOSIS — N390 Urinary tract infection, site not specified: Secondary | ICD-10-CM

## 2016-08-19 ENCOUNTER — Emergency Department: Payer: Medicare Other

## 2016-08-19 ENCOUNTER — Inpatient Hospital Stay: Payer: Medicare Other

## 2016-08-19 ENCOUNTER — Inpatient Hospital Stay
Admission: EM | Admit: 2016-08-19 | Discharge: 2016-08-30 | DRG: 871 | Disposition: A | Discharge status: Home Health | Payer: Medicare Other | Attending: Internal Medicine | Admitting: Internal Medicine

## 2016-08-19 DIAGNOSIS — R109 Unspecified abdominal pain: Secondary | ICD-10-CM

## 2016-08-19 DIAGNOSIS — E785 Hyperlipidemia, unspecified: Secondary | ICD-10-CM | POA: Diagnosis present

## 2016-08-19 DIAGNOSIS — I429 Cardiomyopathy, unspecified: Secondary | ICD-10-CM | POA: Diagnosis present

## 2016-08-19 DIAGNOSIS — E1165 Type 2 diabetes mellitus with hyperglycemia: Secondary | ICD-10-CM | POA: Diagnosis present

## 2016-08-19 DIAGNOSIS — J9601 Acute respiratory failure with hypoxia: Secondary | ICD-10-CM | POA: Diagnosis not present

## 2016-08-19 DIAGNOSIS — E7800 Pure hypercholesterolemia, unspecified: Secondary | ICD-10-CM | POA: Diagnosis present

## 2016-08-19 DIAGNOSIS — Z9889 Other specified postprocedural states: Secondary | ICD-10-CM

## 2016-08-19 DIAGNOSIS — Z66 Do not resuscitate: Secondary | ICD-10-CM | POA: Diagnosis present

## 2016-08-19 DIAGNOSIS — Z515 Encounter for palliative care: Secondary | ICD-10-CM

## 2016-08-19 DIAGNOSIS — Z7982 Long term (current) use of aspirin: Secondary | ICD-10-CM | POA: Diagnosis not present

## 2016-08-19 DIAGNOSIS — N400 Enlarged prostate without lower urinary tract symptoms: Secondary | ICD-10-CM | POA: Diagnosis present

## 2016-08-19 DIAGNOSIS — R531 Weakness: Secondary | ICD-10-CM

## 2016-08-19 DIAGNOSIS — A419 Sepsis, unspecified organism: Principal | ICD-10-CM | POA: Diagnosis present

## 2016-08-19 DIAGNOSIS — J189 Pneumonia, unspecified organism: Secondary | ICD-10-CM | POA: Diagnosis not present

## 2016-08-19 DIAGNOSIS — I482 Chronic atrial fibrillation: Secondary | ICD-10-CM | POA: Diagnosis present

## 2016-08-19 DIAGNOSIS — J869 Pyothorax without fistula: Secondary | ICD-10-CM | POA: Diagnosis not present

## 2016-08-19 DIAGNOSIS — R0902 Hypoxemia: Secondary | ICD-10-CM

## 2016-08-19 DIAGNOSIS — Z880 Allergy status to penicillin: Secondary | ICD-10-CM

## 2016-08-19 DIAGNOSIS — J471 Bronchiectasis with (acute) exacerbation: Secondary | ICD-10-CM | POA: Diagnosis not present

## 2016-08-19 DIAGNOSIS — R05 Cough: Secondary | ICD-10-CM

## 2016-08-19 DIAGNOSIS — I5043 Acute on chronic combined systolic (congestive) and diastolic (congestive) heart failure: Secondary | ICD-10-CM | POA: Diagnosis present

## 2016-08-19 DIAGNOSIS — J9620 Acute and chronic respiratory failure, unspecified whether with hypoxia or hypercapnia: Secondary | ICD-10-CM | POA: Diagnosis not present

## 2016-08-19 DIAGNOSIS — J449 Chronic obstructive pulmonary disease, unspecified: Secondary | ICD-10-CM | POA: Diagnosis present

## 2016-08-19 DIAGNOSIS — Z7189 Other specified counseling: Secondary | ICD-10-CM | POA: Diagnosis not present

## 2016-08-19 DIAGNOSIS — T380X5A Adverse effect of glucocorticoids and synthetic analogues, initial encounter: Secondary | ICD-10-CM | POA: Diagnosis present

## 2016-08-19 DIAGNOSIS — J9621 Acute and chronic respiratory failure with hypoxia: Secondary | ICD-10-CM | POA: Diagnosis not present

## 2016-08-19 DIAGNOSIS — Y842 Radiological procedure and radiotherapy as the cause of abnormal reaction of the patient, or of later complication, without mention of misadventure at the time of the procedure: Secondary | ICD-10-CM | POA: Diagnosis present

## 2016-08-19 DIAGNOSIS — Z923 Personal history of irradiation: Secondary | ICD-10-CM

## 2016-08-19 DIAGNOSIS — J90 Pleural effusion, not elsewhere classified: Secondary | ICD-10-CM

## 2016-08-19 DIAGNOSIS — N179 Acute kidney failure, unspecified: Secondary | ICD-10-CM | POA: Diagnosis present

## 2016-08-19 DIAGNOSIS — E1140 Type 2 diabetes mellitus with diabetic neuropathy, unspecified: Secondary | ICD-10-CM | POA: Diagnosis present

## 2016-08-19 DIAGNOSIS — K219 Gastro-esophageal reflux disease without esophagitis: Secondary | ICD-10-CM | POA: Diagnosis present

## 2016-08-19 DIAGNOSIS — R059 Cough, unspecified: Secondary | ICD-10-CM

## 2016-08-19 DIAGNOSIS — E860 Dehydration: Secondary | ICD-10-CM | POA: Diagnosis not present

## 2016-08-19 DIAGNOSIS — J441 Chronic obstructive pulmonary disease with (acute) exacerbation: Secondary | ICD-10-CM | POA: Diagnosis present

## 2016-08-19 DIAGNOSIS — R509 Fever, unspecified: Secondary | ICD-10-CM

## 2016-08-19 DIAGNOSIS — Z85118 Personal history of other malignant neoplasm of bronchus and lung: Secondary | ICD-10-CM

## 2016-08-19 DIAGNOSIS — J440 Chronic obstructive pulmonary disease with acute lower respiratory infection: Secondary | ICD-10-CM | POA: Diagnosis present

## 2016-08-19 DIAGNOSIS — Z882 Allergy status to sulfonamides status: Secondary | ICD-10-CM

## 2016-08-19 DIAGNOSIS — Z95810 Presence of automatic (implantable) cardiac defibrillator: Secondary | ICD-10-CM | POA: Diagnosis not present

## 2016-08-19 DIAGNOSIS — J701 Chronic and other pulmonary manifestations due to radiation: Secondary | ICD-10-CM | POA: Diagnosis not present

## 2016-08-19 DIAGNOSIS — I5033 Acute on chronic diastolic (congestive) heart failure: Secondary | ICD-10-CM | POA: Diagnosis not present

## 2016-08-19 DIAGNOSIS — Z87891 Personal history of nicotine dependence: Secondary | ICD-10-CM | POA: Diagnosis not present

## 2016-08-19 DIAGNOSIS — R0603 Acute respiratory distress: Secondary | ICD-10-CM

## 2016-08-19 DIAGNOSIS — R0602 Shortness of breath: Secondary | ICD-10-CM

## 2016-08-19 DIAGNOSIS — I110 Hypertensive heart disease with heart failure: Secondary | ICD-10-CM | POA: Diagnosis present

## 2016-08-19 DIAGNOSIS — J8410 Pulmonary fibrosis, unspecified: Secondary | ICD-10-CM | POA: Diagnosis present

## 2016-08-19 DIAGNOSIS — K5900 Constipation, unspecified: Secondary | ICD-10-CM | POA: Diagnosis not present

## 2016-08-19 DIAGNOSIS — E039 Hypothyroidism, unspecified: Secondary | ICD-10-CM | POA: Diagnosis present

## 2016-08-19 DIAGNOSIS — E875 Hyperkalemia: Secondary | ICD-10-CM | POA: Diagnosis not present

## 2016-08-19 DIAGNOSIS — I951 Orthostatic hypotension: Secondary | ICD-10-CM | POA: Diagnosis not present

## 2016-08-19 LAB — COMPREHENSIVE METABOLIC PANEL
ALT: 16 U/L — AB (ref 17–63)
ANION GAP: 15 (ref 5–15)
AST: 43 U/L — ABNORMAL HIGH (ref 15–41)
Albumin: 3.9 g/dL (ref 3.5–5.0)
Alkaline Phosphatase: 72 U/L (ref 38–126)
BUN: 19 mg/dL (ref 6–20)
CHLORIDE: 101 mmol/L (ref 101–111)
CO2: 23 mmol/L (ref 22–32)
CREATININE: 1.33 mg/dL — AB (ref 0.61–1.24)
Calcium: 9.3 mg/dL (ref 8.9–10.3)
GFR calc non Af Amer: 49 mL/min — ABNORMAL LOW (ref >60)
GFR, EST AFRICAN AMERICAN: 57 mL/min — AB (ref >60)
Glucose, Bld: 179 mg/dL — ABNORMAL HIGH (ref 65–99)
POTASSIUM: 4.2 mmol/L (ref 3.5–5.1)
SODIUM: 139 mmol/L (ref 135–145)
Total Bilirubin: 1 mg/dL (ref 0.3–1.2)
Total Protein: 8.4 g/dL — ABNORMAL HIGH (ref 6.5–8.1)

## 2016-08-19 LAB — BRAIN NATRIURETIC PEPTIDE: B NATRIURETIC PEPTIDE 5: 179 pg/mL — AB (ref 0.0–100.0)

## 2016-08-19 LAB — MAGNESIUM: MAGNESIUM: 1.3 mg/dL — AB (ref 1.7–2.4)

## 2016-08-19 LAB — CBC WITH DIFFERENTIAL/PLATELET
Basophils Absolute: 0.1 10*3/uL (ref 0–0.1)
Basophils Relative: 0 %
Eosinophils Absolute: 0.4 10*3/uL (ref 0–0.7)
Eosinophils Relative: 2 %
HEMATOCRIT: 44.4 % (ref 40.0–52.0)
HEMOGLOBIN: 14.1 g/dL (ref 13.0–18.0)
LYMPHS ABS: 2.7 10*3/uL (ref 1.0–3.6)
LYMPHS PCT: 13 %
MCH: 29 pg (ref 26.0–34.0)
MCHC: 31.8 g/dL — AB (ref 32.0–36.0)
MCV: 91.3 fL (ref 80.0–100.0)
MONOS PCT: 5 %
Monocytes Absolute: 1 10*3/uL (ref 0.2–1.0)
NEUTROS ABS: 17.1 10*3/uL — AB (ref 1.4–6.5)
NEUTROS PCT: 80 %
Platelets: 273 10*3/uL (ref 150–440)
RBC: 4.87 MIL/uL (ref 4.40–5.90)
RDW: 16 % — ABNORMAL HIGH (ref 11.5–14.5)
WBC: 21.2 10*3/uL — ABNORMAL HIGH (ref 3.8–10.6)

## 2016-08-19 LAB — TROPONIN I

## 2016-08-19 LAB — BLOOD GAS, ARTERIAL
Acid-base deficit: 5 mmol/L — ABNORMAL HIGH (ref 0.0–2.0)
Bicarbonate: 19.8 mmol/L — ABNORMAL LOW (ref 20.0–28.0)
FIO2: 1
O2 SAT: 99.7 %
PATIENT TEMPERATURE: 37
pCO2 arterial: 35 mmHg (ref 32.0–48.0)
pH, Arterial: 7.36 (ref 7.350–7.450)
pO2, Arterial: 206 mmHg — ABNORMAL HIGH (ref 83.0–108.0)

## 2016-08-19 LAB — TSH: TSH: 1.209 u[IU]/mL (ref 0.350–4.500)

## 2016-08-19 LAB — LACTIC ACID, PLASMA
Lactic Acid, Venous: 6.9 mmol/L (ref 0.5–1.9)
Lactic Acid, Venous: 3.2 mmol/L (ref 0.5–1.9)

## 2016-08-19 LAB — LIPASE, BLOOD: Lipase: 24 U/L (ref 11–51)

## 2016-08-19 MED ORDER — ACETAMINOPHEN 325 MG PO TABS
650.0000 mg | ORAL_TABLET | Freq: Four times a day (QID) | ORAL | Status: DC | PRN
  Administered 2016-08-23 – 2016-08-29 (×2): 650 mg via ORAL
  Filled 2016-08-19: qty 2

## 2016-08-19 MED ORDER — RISAQUAD PO CAPS
1.0000 | ORAL_CAPSULE | Freq: Every day | ORAL | Status: DC
  Administered 2016-08-19 – 2016-08-30 (×12): 1 via ORAL
  Filled 2016-08-19 (×12): qty 1

## 2016-08-19 MED ORDER — VITAMIN D 1000 UNITS PO TABS
5000.0000 [IU] | ORAL_TABLET | Freq: Every day | ORAL | Status: DC
  Administered 2016-08-20 – 2016-08-30 (×11): 5000 [IU] via ORAL
  Filled 2016-08-19 (×11): qty 5

## 2016-08-19 MED ORDER — POTASSIUM CHLORIDE CRYS ER 10 MEQ PO TBCR
10.0000 meq | EXTENDED_RELEASE_TABLET | Freq: Every day | ORAL | Status: DC
  Administered 2016-08-19 – 2016-08-23 (×5): 10 meq via ORAL
  Filled 2016-08-19 (×5): qty 1

## 2016-08-19 MED ORDER — ADULT MULTIVITAMIN W/MINERALS CH
1.0000 | ORAL_TABLET | Freq: Every day | ORAL | Status: DC
  Administered 2016-08-20 – 2016-08-30 (×11): 1 via ORAL
  Filled 2016-08-19 (×11): qty 1

## 2016-08-19 MED ORDER — ONDANSETRON HCL 4 MG PO TABS
4.0000 mg | ORAL_TABLET | Freq: Four times a day (QID) | ORAL | Status: DC | PRN
  Administered 2016-08-27 – 2016-08-30 (×3): 4 mg via ORAL
  Filled 2016-08-19 (×3): qty 1

## 2016-08-19 MED ORDER — SODIUM CHLORIDE 0.9 % IV BOLUS (SEPSIS)
500.0000 mL | Freq: Once | INTRAVENOUS | Status: AC
  Administered 2016-08-19: 500 mL via INTRAVENOUS

## 2016-08-19 MED ORDER — DOXYCYCLINE HYCLATE 100 MG IV SOLR
100.0000 mg | Freq: Two times a day (BID) | INTRAVENOUS | Status: DC
  Administered 2016-08-19: 100 mg via INTRAVENOUS
  Filled 2016-08-19 (×2): qty 100

## 2016-08-19 MED ORDER — IPRATROPIUM-ALBUTEROL 0.5-2.5 (3) MG/3ML IN SOLN
3.0000 mL | Freq: Four times a day (QID) | RESPIRATORY_TRACT | Status: DC
  Administered 2016-08-19 – 2016-08-25 (×21): 3 mL via RESPIRATORY_TRACT
  Filled 2016-08-19 (×15): qty 3
  Filled 2016-08-19: qty 6
  Filled 2016-08-19 (×8): qty 3

## 2016-08-19 MED ORDER — VITAMIN E 180 MG (400 UNIT) PO CAPS
400.0000 [IU] | ORAL_CAPSULE | Freq: Every day | ORAL | Status: DC
  Administered 2016-08-20 – 2016-08-30 (×11): 400 [IU] via ORAL
  Filled 2016-08-19 (×11): qty 1

## 2016-08-19 MED ORDER — LEVOFLOXACIN IN D5W 750 MG/150ML IV SOLN: 750.0000 mg | INTRAVENOUS | Status: DC

## 2016-08-19 MED ORDER — DOXYCYCLINE HYCLATE 100 MG IV SOLR
100.0000 mg | Freq: Once | INTRAVENOUS | Status: AC
  Administered 2016-08-19: 100 mg via INTRAVENOUS
  Filled 2016-08-19: qty 100

## 2016-08-19 MED ORDER — ONDANSETRON HCL 4 MG/2ML IJ SOLN
4.0000 mg | Freq: Four times a day (QID) | INTRAMUSCULAR | Status: DC | PRN
  Administered 2016-08-25 – 2016-08-29 (×3): 4 mg via INTRAVENOUS
  Filled 2016-08-19 (×4): qty 2

## 2016-08-19 MED ORDER — CHOLECALCIFEROL 125 MCG (5000 UT) PO TABS: 5000.0000 [IU] | ORAL_TABLET | Freq: Every morning | ORAL | Status: DC

## 2016-08-19 MED ORDER — IPRATROPIUM-ALBUTEROL 0.5-2.5 (3) MG/3ML IN SOLN
3.0000 mL | Freq: Once | RESPIRATORY_TRACT | Status: AC
  Administered 2016-08-19: 3 mL via RESPIRATORY_TRACT
  Filled 2016-08-19: qty 3

## 2016-08-19 MED ORDER — SIMVASTATIN 20 MG PO TABS
20.0000 mg | ORAL_TABLET | Freq: Every day | ORAL | Status: DC
  Administered 2016-08-19 – 2016-08-29 (×10): 20 mg via ORAL
  Filled 2016-08-19 (×10): qty 1

## 2016-08-19 MED ORDER — SODIUM CHLORIDE 0.9 % IV BOLUS (SEPSIS)
1000.0000 mL | Freq: Once | INTRAVENOUS | Status: AC
  Administered 2016-08-19: 1000 mL via INTRAVENOUS

## 2016-08-19 MED ORDER — ASPIRIN EC 81 MG PO TBEC
81.0000 mg | DELAYED_RELEASE_TABLET | Freq: Every day | ORAL | Status: DC
  Administered 2016-08-19 – 2016-08-30 (×12): 81 mg via ORAL
  Filled 2016-08-19 (×12): qty 1

## 2016-08-19 MED ORDER — ACETAMINOPHEN 650 MG RE SUPP
975.0000 mg | Freq: Once | RECTAL | Status: AC
  Administered 2016-08-19: 975 mg via RECTAL
  Filled 2016-08-19: qty 1

## 2016-08-19 MED ORDER — DIGOXIN 250 MCG PO TABS
250.0000 ug | ORAL_TABLET | Freq: Every day | ORAL | Status: DC
  Administered 2016-08-19 – 2016-08-25 (×7): 250 ug via ORAL
  Filled 2016-08-19 (×7): qty 1

## 2016-08-19 MED ORDER — METHENAMINE HIPPURATE 1 G PO TABS: 1.0000 g | ORAL_TABLET | Freq: Two times a day (BID) | ORAL | Status: DC

## 2016-08-19 MED ORDER — GUAIFENESIN-DM 100-10 MG/5ML PO SYRP
5.0000 mL | ORAL_SOLUTION | ORAL | Status: DC | PRN
  Administered 2016-08-19 – 2016-08-20 (×3): 5 mL via ORAL
  Filled 2016-08-19 (×3): qty 5

## 2016-08-19 MED ORDER — ENOXAPARIN SODIUM 40 MG/0.4ML ~~LOC~~ SOLN
40.0000 mg | SUBCUTANEOUS | Status: DC
  Administered 2016-08-19: 40 mg via SUBCUTANEOUS
  Filled 2016-08-19: qty 0.4

## 2016-08-19 MED ORDER — COENZYME Q10 100 MG PO CAPS: 100.0000 mg | ORAL_CAPSULE | Freq: Every day | ORAL | Status: DC

## 2016-08-19 MED ORDER — ONDANSETRON HCL 4 MG/2ML IJ SOLN
4.0000 mg | Freq: Once | INTRAMUSCULAR | Status: AC
  Administered 2016-08-19: 4 mg via INTRAVENOUS
  Filled 2016-08-19: qty 2

## 2016-08-19 MED ORDER — MAGNESIUM SULFATE 4 GM/100ML IV SOLN
4.0000 g | Freq: Once | INTRAVENOUS | Status: AC
  Administered 2016-08-19: 4 g via INTRAVENOUS
  Filled 2016-08-19: qty 100

## 2016-08-19 MED ORDER — FUROSEMIDE 20 MG PO TABS: 20.0000 mg | ORAL_TABLET | ORAL | Status: DC | PRN

## 2016-08-19 MED ORDER — LEVOFLOXACIN IN D5W 750 MG/150ML IV SOLN
750.0000 mg | Freq: Once | INTRAVENOUS | Status: AC
  Administered 2016-08-19: 750 mg via INTRAVENOUS
  Filled 2016-08-19: qty 150

## 2016-08-19 MED ORDER — DOCUSATE SODIUM 100 MG PO CAPS
100.0000 mg | ORAL_CAPSULE | Freq: Two times a day (BID) | ORAL | Status: DC
  Administered 2016-08-19 – 2016-08-25 (×10): 100 mg via ORAL
  Filled 2016-08-19 (×12): qty 1

## 2016-08-19 MED ORDER — GABAPENTIN 600 MG PO TABS
600.0000 mg | ORAL_TABLET | Freq: Two times a day (BID) | ORAL | Status: DC
  Administered 2016-08-19 – 2016-08-23 (×9): 600 mg via ORAL
  Filled 2016-08-19 (×9): qty 1

## 2016-08-19 MED ORDER — HYDROCORTISONE 10 MG PO TABS
10.0000 mg | ORAL_TABLET | Freq: Two times a day (BID) | ORAL | Status: DC
  Administered 2016-08-19 – 2016-08-20 (×2): 10 mg via ORAL
  Filled 2016-08-19 (×3): qty 1

## 2016-08-19 MED ORDER — CYANOCOBALAMIN 500 MCG PO TABS
500.0000 ug | ORAL_TABLET | Freq: Every day | ORAL | Status: DC
  Administered 2016-08-19 – 2016-08-30 (×12): 500 ug via ORAL
  Filled 2016-08-19 (×12): qty 1

## 2016-08-19 MED ORDER — ONDANSETRON HCL 4 MG/2ML IJ SOLN
INTRAMUSCULAR | Status: AC
  Filled 2016-08-19: qty 2

## 2016-08-19 MED ORDER — SODIUM CHLORIDE 0.9 % IV SOLN
INTRAVENOUS | Status: DC
  Administered 2016-08-19 (×2): via INTRAVENOUS

## 2016-08-19 MED ORDER — LORAZEPAM 2 MG/ML IJ SOLN
0.5000 mg | Freq: Once | INTRAMUSCULAR | Status: AC
  Administered 2016-08-19: 0.5 mg via INTRAVENOUS
  Filled 2016-08-19: qty 1

## 2016-08-19 MED ORDER — METHENAMINE MANDELATE 1 G PO TABS
1000.0000 mg | ORAL_TABLET | Freq: Two times a day (BID) | ORAL | Status: DC
  Administered 2016-08-19 – 2016-08-20 (×2): 1000 mg via ORAL
  Filled 2016-08-19 (×3): qty 1

## 2016-08-19 MED ORDER — ONDANSETRON HCL 4 MG/2ML IJ SOLN
4.0000 mg | Freq: Once | INTRAMUSCULAR | Status: AC
  Administered 2016-08-19: 4 mg via INTRAVENOUS

## 2016-08-19 MED ORDER — VITAMIN C 500 MG PO TABS
500.0000 mg | ORAL_TABLET | Freq: Three times a day (TID) | ORAL | Status: DC
  Administered 2016-08-19 – 2016-08-30 (×30): 500 mg via ORAL
  Filled 2016-08-19 (×39): qty 1

## 2016-08-19 MED ORDER — PANTOPRAZOLE SODIUM 40 MG PO TBEC
40.0000 mg | DELAYED_RELEASE_TABLET | Freq: Every day | ORAL | Status: DC
  Administered 2016-08-20 – 2016-08-30 (×11): 40 mg via ORAL
  Filled 2016-08-19 (×11): qty 1

## 2016-08-19 MED ORDER — VANCOMYCIN HCL IN DEXTROSE 1-5 GM/200ML-% IV SOLN
1000.0000 mg | Freq: Once | INTRAVENOUS | Status: AC
  Administered 2016-08-19: 1000 mg via INTRAVENOUS
  Filled 2016-08-19: qty 200

## 2016-08-19 MED ORDER — MAGNESIUM OXIDE 400 (241.3 MG) MG PO TABS
400.0000 mg | ORAL_TABLET | Freq: Every day | ORAL | Status: DC
  Administered 2016-08-19 – 2016-08-30 (×12): 400 mg via ORAL
  Filled 2016-08-19 (×12): qty 1

## 2016-08-19 MED ORDER — MIDODRINE HCL 5 MG PO TABS
5.0000 mg | ORAL_TABLET | Freq: Three times a day (TID) | ORAL | Status: DC
  Administered 2016-08-19 – 2016-08-27 (×22): 5 mg via ORAL
  Filled 2016-08-19: qty 2
  Filled 2016-08-19: qty 1
  Filled 2016-08-19 (×4): qty 2
  Filled 2016-08-19: qty 1
  Filled 2016-08-19 (×2): qty 2
  Filled 2016-08-19 (×2): qty 1
  Filled 2016-08-19 (×4): qty 2
  Filled 2016-08-19: qty 1
  Filled 2016-08-19 (×5): qty 2
  Filled 2016-08-19 (×2): qty 1

## 2016-08-19 MED ORDER — VANCOMYCIN HCL 10 G IV SOLR
1500.0000 mg | INTRAVENOUS | Status: DC
  Administered 2016-08-20: 1500 mg via INTRAVENOUS
  Filled 2016-08-19 (×3): qty 1500

## 2016-08-19 MED ORDER — ACETAMINOPHEN 650 MG RE SUPP: 650.0000 mg | Freq: Four times a day (QID) | RECTAL | Status: DC | PRN

## 2016-08-19 MED ORDER — LEVOTHYROXINE SODIUM 50 MCG PO TABS
50.0000 ug | ORAL_TABLET | Freq: Every day | ORAL | Status: DC
  Administered 2016-08-20 – 2016-08-30 (×11): 50 ug via ORAL
  Filled 2016-08-19 (×11): qty 1

## 2016-08-19 NOTE — ED Notes (Signed)
Patient updated on delay of admission. Patient verbalizes understanding.

## 2016-08-19 NOTE — ED Notes (Signed)
Charge RN called to get room status update.

## 2016-08-19 NOTE — ED Notes (Signed)
Pt vomited with BIPAP, RT called and placed pt on nonrebreather, pt currently at 82%. MD at bedside. Pt sunctioned

## 2016-08-19 NOTE — ED Notes (Signed)
Patient refusing bipap at this time. Patients daughter at bedside, she states "he is in his right mind. Take that off of him now!". Patient and family verbalizes understanding that the bipap with help with his breathing and his progression of care. Patient states, "I don't want that mask on me ever again". Patient offered ativan and/or other medication to help him relax. Patient refuses. Dr. Beather Arbour made aware. Patient will remain on non rebreather at this time. Admitting MD paged.

## 2016-08-19 NOTE — Progress Notes (Signed)
Pt complains of cough. Protocol orders will be entered for cough. I will continue to assess.

## 2016-08-19 NOTE — ED Provider Notes (Signed)
[facility] Emergency Department Provider Note   ____________________________________________   First MD Initiated Contact with Patient 08/19/16 [phone]     (approximate)  I have reviewed the triage vital signs and the nursing notes.   HISTORY  Chief Complaint Respiratory Distress    HPI Russell Howard is a 82 y.o. male brought to the ED via EMS from home with a chief complaint of respiratory distress. Patient has a history of cardiomyopathy, CHF,septic shock who was in his usual state of health when he went to bed last night when he awoke with respiratory distress, coughing and vomiting. EMS reports history of similar with admissions for sepsis previously. Patient complains of chills, cough, shortness of breath, nausea and vomiting. Denies chest pain, abdominal pain. Denies recent travel or trauma. Arrives to the ED hypoxic on nasal cannula oxygen, active dry heaving.   Past Medical History:  Diagnosis Date  . Acquired hypothyroidism 11/02/2014  . BPH (benign prostatic hyperplasia)   . Cardiac defibrillator in place 11/02/2014  . Cardiomyopathy ([place])   . CHF (congestive heart failure) ([place])   . Chronic diastolic heart failure ([place]) 11/02/2014  . Gastro-esophageal reflux disease without esophagitis 11/02/2014  . H/O malignant neoplasm 11/09/2014  . Heart valve disease 11/02/2014  . History of atrial fibrillation 11/02/2014  . Lung cancer ([place])   . Neuropathy 11/02/2014  . Orthostasis 11/02/2014  . Pure hypercholesterolemia 11/02/2014  . Valvular heart disease     Patient Active Problem List   Diagnosis Date Noted  . Acute on chronic respiratory failure with hypoxia ([place]) 08/19/2016  . Septic shock ([place]) 03/02/2016  . Hydronephrosis, right 01/14/2016  . Constipation 01/13/2016  . Urinary retention 01/13/2016  . Lower abdominal pain 01/13/2016  . Bacteremia 01/13/2016  . Hypotension 01/13/2016  . Palliative care encounter   . Goals of care,  counseling/discussion   . Cough   . Encounter for hospice care discussion   . Hypoxia 01/09/2016  . Primary cancer of bronchus of right lower lobe ([place]) 11/29/2015  . Sepsis ([place]) 06/14/2015  . Lower urinary tract infectious disease 06/14/2015    Class: Acute  . H/O malignant neoplasm 11/09/2014  . Acquired hypothyroidism 11/02/2014  . Cardiomyopathy ([place]) 11/02/2014  . Chronic diastolic heart failure ([place]) 11/02/2014  . Diabetes mellitus without complication ([place]) 70/35/0093  . Cardiac defibrillator in place 11/02/2014  . Gastro-esophageal reflux disease without esophagitis 11/02/2014  . History of atrial fibrillation 11/02/2014  . BP (high blood pressure) 11/02/2014  . Neuropathy 11/02/2014  . Orthostasis 11/02/2014  . Pure hypercholesterolemia 11/02/2014  . Heart valve disease 11/02/2014    Past Surgical History:  Procedure Laterality Date  . APPENDECTOMY    . DUAL ICD IMPLANT  2007/2009   implantable cardiac defibrillator  . PORT-A-CATH REMOVAL      Prior to Admission medications   Medication Sig Start Date End Date Taking? Authorizing Provider  acidophilus (RISAQUAD) CAPS capsule Take 1 capsule by mouth daily.   Yes [provider]  Ascorbic Acid (VITAMIN C) 1000 MG tablet Take 500 mg by mouth 3 (three) times daily. Reported on 06/14/2015   Yes [provider]  aspirin EC 81 MG tablet Take 81 mg by mouth daily.    Yes [provider]  Cholecalciferol 5000 units TABS Take 5,000 Units by mouth every morning.   Yes [provider]  Coenzyme Q10 (TH CO Q-10) 100 MG capsule Take 100 mg by mouth daily.    Yes [provider]  Reymundo Poll  250 MCG tablet Take 250 mcg by mouth daily.   Yes [provider]  furosemide (LASIX) 20 MG tablet Take 20 mg by mouth every other day as needed (as needed for water retention).  05/10/15  Yes [provider]  gabapentin (NEURONTIN) 600 MG tablet Take 600 mg by mouth 2 (two) times daily.     Yes [provider]  hydrocortisone (CORTEF) 10 MG tablet Take 10 mg by mouth 2 (two) times daily.    Yes [provider]  ipratropium-albuterol (DUONEB) 0.5-2.5 (3) MG/3ML SOLN Take 3 mLs by nebulization every 6 (six) hours as needed. 06/12/16  Yes Wilhelmina Mcardle, MD  levothyroxine (SYNTHROID, LEVOTHROID) 50 MCG tablet Take 50 mcg by mouth daily before breakfast.   Yes [provider]  MAGNESIUM-OXIDE 400 (241.3 Mg) MG tablet Take 400 mg by mouth daily.   Yes [provider]  methenamine (HIPREX) 1 g tablet TAKE 1 TABLET BY MOUTH TWICE DAILY WITH MEALS 07/31/16  Yes McGowan, Larene [place] A, PA-C  midodrine (PROAMATINE) 5 MG tablet Take 5 mg by mouth 3 (three) times daily with meals.   Yes [provider]  Multiple Vitamin (MULTIVITAMIN WITH MINERALS) TABS tablet Take 1 tablet by mouth daily.   Yes [provider]  pantoprazole (PROTONIX) 40 MG tablet Take 40 mg by mouth daily before breakfast.    Yes [provider]  potassium chloride (MICRO-K) 10 MEQ CR capsule Take 10 mEq by mouth daily as needed (only with lasix).    Yes [provider]  SAW PALMETTO-PUMPKIN SEED OIL PO Take 1 capsule by mouth daily.    Yes [provider]  simvastatin (ZOCOR) 20 MG tablet Take 20 mg by mouth daily at 6 PM.    Yes [provider]  vitamin B-12 (CYANOCOBALAMIN) 500 MCG tablet Take 500 mcg by mouth daily.    Yes [provider]  vitamin E 400 UNIT capsule Take 400 Units by mouth daily.    Yes [provider]    Allergies Penicillin g and Sulfa antibiotics  Family History  Problem Relation Age of Onset  . Hypertension Unknown   . Heart disease Unknown     Social History Social History  Substance Use Topics  . Smoking status: Former Smoker    Types: Cigarettes    Quit date: 11/21/1981  . Smokeless tobacco: Never Used  . Alcohol use No    Review of Systems  Constitutional: Positive for  fever/chills. Eyes: No visual changes. ENT: No sore throat. Cardiovascular: Denies chest pain. Respiratory: Positive for cough and shortness of breath. Gastrointestinal: No abdominal pain.  Positive for nausea and vomiting.  No diarrhea.  No constipation. Genitourinary: Negative for dysuria. Musculoskeletal: Negative for back pain. Skin: Negative for rash. Neurological: Negative for headaches, focal weakness or numbness.   ____________________________________________   PHYSICAL EXAM:  VITAL SIGNS: ED Triage Vitals  Enc Vitals Group     BP      Pulse      Resp      Temp      Temp src      SpO2      Weight      Height      Head Circumference      Peak Flow      Pain Score      Pain Loc      Pain Edu?      Excl. in [place]?     Constitutional: Alert and oriented. Ill appearing  and in moderate acute distress. Eyes: Conjunctivae are normal. PERRL. EOMI. Head: Atraumatic. Nose: No congestion/rhinnorhea. Mouth/Throat: Mucous membranes are moist.  Oropharynx non-erythematous. Neck: No stridor.  Supple neck without meningismus. Cardiovascular: Normal rate, regular rhythm. Grossly normal heart sounds.  Thready peripheral circulation. Respiratory: Increased respiratory effort.  No retractions. Lungs with rales and rhonchi diffusely. Gastrointestinal: Soft and nontender. No distention. No abdominal bruits. No CVA tenderness. Musculoskeletal: No lower extremity tenderness nor edema.  No joint effusions. Neurologic:  Normal speech and language. No gross focal neurologic deficits are appreciated.  Skin:  Skin is cool, mottled, dry and intact. No rash noted. Psychiatric: Mood and affect are normal. Speech and behavior are normal.  ____________________________________________   LABS (all labs ordered are listed, but only abnormal results are displayed)  Labs Reviewed  COMPREHENSIVE METABOLIC PANEL - Abnormal; Notable for the following:       Result Value   Glucose, Bld 179 (*)     Creatinine, Ser 1.33 (*)    Total Protein 8.4 (*)    AST 43 (*)    ALT 16 (*)    GFR calc non Af Amer 49 (*)    GFR calc Af Amer 57 (*)    All other components within normal limits  CBC WITH DIFFERENTIAL/PLATELET - Abnormal; Notable for the following:    WBC 21.2 (*)    MCHC 31.8 (*)    RDW 16.0 (*)    Neutro Abs 17.1 (*)    All other components within normal limits  LACTIC ACID, PLASMA - Abnormal; Notable for the following:    Lactic Acid, Venous 6.9 (*)    All other components within normal limits  BLOOD GAS, ARTERIAL - Abnormal; Notable for the following:    pO2, Arterial 206 (*)    Bicarbonate 19.8 (*)    Acid-base deficit 5.0 (*)    All other components within normal limits  BRAIN NATRIURETIC PEPTIDE - Abnormal; Notable for the following:    B Natriuretic Peptide 179.0 (*)    All other components within normal limits  CULTURE, BLOOD (ROUTINE X 2)  CULTURE, BLOOD (ROUTINE X 2)  URINE CULTURE  LIPASE, BLOOD  TROPONIN I  LACTIC ACID, PLASMA  URINALYSIS, COMPLETE (UACMP) WITH MICROSCOPIC   ____________________________________________  EKG  ED ECG REPORT I, Josedaniel Haye J, the attending physician, personally viewed and interpreted this ECG.   Date: 08/19/2016  EKG Time: 0554  Rate: 83  Rhythm: normal EKG, normal sinus rhythm, paced rhythm  Axis: Paced  Intervals:none  ST&T Change: Paced  ____________________________________________  RADIOLOGY  Dg Chest Port 1 View  Result Date: 08/19/2016 CLINICAL DATA:  82 year old male with respiratory distress and vomiting. EXAM: PORTABLE CHEST 1 VIEW COMPARISON:  Chest radiograph dated 06/12/2016 FINDINGS: There is diffuse interstitial coarsening and right lung pleural thickening consistent with known fibrosis. There is increased airspace opacity involving the right lung with overall decreased aeration of the right lung. An area of increased density in the left mid lung field/left upper lobe adjacent to the pacemaker device  appears new compared to prior study. A small pleural effusion is not excluded. There is no pneumothorax. Stable cardiac silhouette. Left pectoral pacemaker device and cardiac mechanical bowel noted. No acute osseous pathology. IMPRESSION: Findings of pulmonary fibrosis with overall increased airspace density and worsened aeration of the right lung. Left upper lobe density also appears new from prior study. Findings concerning for developing pneumonia on a background of chronic lung disease. Clinical correlation follow-up recommended. Electronically Signed  By: Anner Crete M.D.   On: 08/19/2016 06:27    ____________________________________________   PROCEDURES  Procedure(s) performed: None  Procedures  Critical Care performed: Yes, see critical care note(s)   CRITICAL CARE Performed by: Paulette Blanch   Total critical care time: 45 minutes  Critical care time was exclusive of separately billable procedures and treating other patients.  Critical care was necessary to treat or prevent imminent or life-threatening deterioration.  Critical care was time spent personally by me on the following activities: development of treatment plan with patient and/or surrogate as well as nursing, discussions with consultants, evaluation of patient's response to treatment, examination of patient, obtaining history from patient or surrogate, ordering and performing treatments and interventions, ordering and review of laboratory studies, ordering and review of radiographic studies, pulse oximetry and re-evaluation of patient's condition.  ____________________________________________   INITIAL IMPRESSION / ASSESSMENT AND PLAN / ED COURSE  Pertinent labs & imaging results that were available during my care of the patient were reviewed by me and considered in my medical decision making (see chart for details).  82 year old male with cardiomyopathy, prior history of sepsis who presents in acute  respiratory distress, hypoxic, coughing, vomiting. ED code sepsis initiated immediately upon patient's arrival. Zofran given for antiemetic. IV antibiotics ordered. IV fluid resuscitation initiated.  Clinical Course as of Aug 20 798  Tue Aug 19, 2016  0617 Patient on BiPAP; respiratory status improving.  [JS]  0620 Wet read of chest x-ray demonstrates almost complete whiteout of right lung, worse than previous. IV antibiotics hanging. Leukocytosis noted. Discussed with hospitalist who will evaluate patient in the emergency department for admission.  [JS]  [phone] Spoke with pharmacist Shanon Brow; patient has prolonged QTC, will change Levaquin to doxycycline.  [JS]    Clinical Course User Index [JS] Paulette Blanch, MD     ____________________________________________   FINAL CLINICAL IMPRESSION(S) / ED DIAGNOSES  Final diagnoses:  Acute respiratory failure with hypoxia ([place])  Respiratory distress  Sepsis, due to unspecified organism ([place])  Fever, unspecified fever cause  Community acquired pneumonia, unspecified laterality      NEW MEDICATIONS STARTED DURING THIS VISIT:  New Prescriptions   No medications on file     Note:  This document was prepared using Dragon voice recognition software and may include unintentional dictation errors.    Paulette Blanch, MD 08/19/16 0800

## 2016-08-19 NOTE — Progress Notes (Signed)
PHARMACIST - PHYSICIAN ORDER COMMUNICATION  CONCERNING: P&T Medication Policy on Herbal Medications  DESCRIPTION:  This patient's order for:  Coenzyme Q10  has been noted.  This product(s) is classified as an "herbal" or natural product. Due to a lack of definitive safety studies or FDA approval, nonstandard manufacturing practices, plus the potential risk of unknown drug-drug interactions while on inpatient medications, the Pharmacy and Therapeutics Committee does not permit the use of "herbal" or natural products of this type within [facility]   ACTION TAKEN: The pharmacy department is unable to verify this order at this time and your patient has been informed of this safety policy. Please reevaluate patient's clinical condition at discharge and address if the herbal or natural product(s) should be resumed at that time.  Lendon Ka, PharmD Pharmacy Resident

## 2016-08-19 NOTE — ED Notes (Signed)
Date and time results received: 08/19/16 0649 (use smartphrase ".now" to insert current time)  Test: lactic Critical Value: 6.9  Name of Provider Notified: Beather Arbour  Orders Received? Or Actions Taken?: Orders Received - See Orders for details

## 2016-08-19 NOTE — ED Triage Notes (Addendum)
Pt comes via [facility] from home with c/o of shaking, vomiting and shortness of breath. EMS states RR around 50, couldn't get BP. Pt shaking upon arrival and oxygen on 6L at 66%. MD at bedside.

## 2016-08-19 NOTE — Progress Notes (Signed)
Notified by pt that he self caths every morning and every evening. Pt has own supplies from home at bedside.

## 2016-08-19 NOTE — Progress Notes (Signed)
Pharmacy Antibiotic Note  Russell Howard is a 82 y.o. male admitted on 08/19/2016 with pneumonia.  Pharmacy has been consulted for vanc/doxy dosing.  Plan: Patient received vanc 1g IV and levaquin 750 mg IV x 1 in ED  Will follow up w/ vanc 1.5g IV q24h w/ 6 hour stack dose. Will draw vanc trough 7/20 @ 1100 prior to 4th dose. Ke 0.041 T1/2 17 hours ~ 24 hours and increase dose to 1.5g for ease of administration. Goal trough 15 - 20 mcg/mL  Patient was originally ordered levaquin; however, EKG showed QTc of 531 -- spoke to MD to switch to doxycycline -- MD agrees with plan and will order repeat EKG. Will start doxycycline 100 mg IV q12h  Height: 5\' 5"  (165.1 cm) Weight: 180 lb (81.6 kg) IBW/kg (Calculated) : 61.5  Temp (24hrs), Avg:102.9 F (39.4 C), Min:102.9 F (39.4 C), Max:102.9 F (39.4 C)   Recent Labs Lab 08/19/16 0553  WBC 21.2*  CREATININE 1.33*  LATICACIDVEN 6.9*    Estimated Creatinine Clearance: 44.3 mL/min (A) (by C-G formula based on SCr of 1.33 mg/dL (H)).    Allergies  Allergen Reactions  . Penicillin G Hives    Has patient had a PCN reaction causing immediate rash, facial/tongue/throat swelling, SOB or lightheadedness with hypotension: Yes Has patient had a PCN reaction causing severe rash involving mucus membranes or skin necrosis: No Has patient had a PCN reaction that required hospitalization No Has patient had a PCN reaction occurring within the last 10 years: No If all of the above answers are "NO", then may proceed with Cephalosporin use.  . Sulfa Antibiotics Rash    Thank you for allowing pharmacy to be a part of this patient's care.  Tobie Lords, PharmD, BCPS Clinical Pharmacist 08/19/2016

## 2016-08-19 NOTE — ED Notes (Signed)
Patient placed on 6L [place] at this time. Patient resting more comfortably. Respiratory rate has decreased significantly. Awaiting to hear back from [facility] MD in regards to patients admission status since he is refusing bipap.

## 2016-08-19 NOTE — ED Notes (Signed)
Pt placed non rebreather, O2 at 81%. Respiratory called again and pt placed on BIPAP . Pt currently 97% with labored breathing and moaning.

## 2016-08-19 NOTE — ED Notes (Signed)
Dr. Bridgett Larsson returned paged. Dr. Bridgett Larsson updated on patient and family refusal of bipap. Dr. Bridgett Larsson also aware of patients hypotension. No new orders at this time. Dr. Bridgett Larsson still wants patient to go to step down ICU room 4. ICU called. Spoke with ICU secretary and left a message for their charge nurse.

## 2016-08-19 NOTE — Progress Notes (Addendum)
This is a 82 year old male admitted for respiratory failure with hypoxia. The patient feels better, on O2 [place] 6L. he refused BiPAP. Vital signs as above. Physical examinations. Constitutional: He is oriented to person, place, and time. He appears well-developed and well-nourished. No distress.  HENT:  Head: Normocephalic and atraumatic.  Mouth/Throat: Oropharynx is clear and moist.  Eyes: Pupils are equal, round, and reactive to light. Conjunctivae and EOM are normal. No scleral icterus.  Neck: Normal range of motion. Neck supple. No JVD present. No tracheal deviation present. No thyromegaly present.  Cardiovascular: Regular rhythm and normal heart sounds. no gallop and no friction rub.   No murmur heard. Respiratory:  decreased breath sounds in the right lower field. Positive rhonchi but no wheezing or crackles.  GI: Soft. Bowel sounds are normal. He exhibits no distension. There is no tenderness.  Musculoskeletal: Normal range of motion. He exhibits no edema.  Lymphadenopathy:    He has no cervical adenopathy.  Neurological: He is alert and oriented to person, place, and time. No cranial nerve deficit.  Skin: Skin is warm and dry. No rash noted. No erythema.  Psychiatric: He has a normal mood and affect. His behavior is normal. Judgment and thought content normal.    A/P: 1. Acute on chronic respiratory failure: With hypoxia;  Try to wean down O2 [place] to baseline 2 L. NEB.  2. Sepsis: The patient meets criteria via tachypnea, leukocytosis and fever. The patient may have a postobstructive pneumonia. Continue levofloxacin and vancomycin. Follow blood cultures for growth and sensitivities. 3. CHF: Diastolic; chronic. AICD in place. Continue furosemide and digoxin 4. Lung cancer: Whiteout of right lung. The patient is on palliative care. 5. Hypothyroidism: Check TSH; continue Synthroid 6. Diabetes mellitus type 2: Elevated hemoglobin A1c; sliding-scale insulin while hospitalized. Hold  metformin. Continue gabapentin for neuropathy 7. Hyperlipidemia: Continue statin therapy 8. Episodic hypotension: Continue Midrin and hydrocortisone per outpatient regimen 9. UTI prophylaxis: Methenamine  Hypotension and acute renal failure. Continue normal saline IV. Watch for fluid overload due to history of CHF. Hypomagnesemia. Give IV magnesium and follow-up level.  I discussed with the patient and his daughter-in-law. Time spent about 28 minutes.

## 2016-08-19 NOTE — ED Notes (Signed)
Pt vomited and coughing again. MD notified

## 2016-08-19 NOTE — H&P (Signed)
Russell Howard is an 82 y.o. male.   Chief Complaint: Respiratory distress HPI: The patient with past medical history of diastolic congestive heart failure, lung cancer with recurrent pleural effusion and chronic fibrosis presents emergency department complaining of shaking chills and shortness of breath. His daughter states that he has been coughing as well as had intermittent weakness for one week. The patient states that he awoke shivering and had 1 episode of nonbloody nonbilious emesis. In the emergency department the patient required BiPAP to improve his work of breathing. However he could not tolerate the mask and eventually had another episode of emesis. He is placed on nonrebreather. Code sepsis was initiated and once blood cultures antibiotics were initiated as well as fluid resuscitation the emergency department staff called the hospitalist service for admission.  Past Medical History:  Diagnosis Date  . Acquired hypothyroidism 11/02/2014  . BPH (benign prostatic hyperplasia)   . Cardiac defibrillator in place 11/02/2014  . Cardiomyopathy ([place])   . CHF (congestive heart failure) ([place])   . Chronic diastolic heart failure ([place]) 11/02/2014  . Gastro-esophageal reflux disease without esophagitis 11/02/2014  . H/O malignant neoplasm 11/09/2014  . Heart valve disease 11/02/2014  . History of atrial fibrillation 11/02/2014  . Lung cancer ([place])   . Neuropathy 11/02/2014  . Orthostasis 11/02/2014  . Pure hypercholesterolemia 11/02/2014  . Valvular heart disease     Past Surgical History:  Procedure Laterality Date  . APPENDECTOMY    . DUAL ICD IMPLANT  2007/2009   implantable cardiac defibrillator  . PORT-A-CATH REMOVAL      Family History  Problem Relation Age of Onset  . Hypertension Unknown   . Heart disease Unknown    Social History:  reports that he quit smoking about 34 years ago. His smoking use included Cigarettes. He has never used smokeless tobacco. He reports that he does not  drink alcohol or use drugs.  Allergies:  Allergies  Allergen Reactions  . Penicillin G Hives    Has patient had a PCN reaction causing immediate rash, facial/tongue/throat swelling, SOB or lightheadedness with hypotension: Yes Has patient had a PCN reaction causing severe rash involving mucus membranes or skin necrosis: No Has patient had a PCN reaction that required hospitalization No Has patient had a PCN reaction occurring within the last 10 years: No If all of the above answers are "NO", then may proceed with Cephalosporin use.  . Sulfa Antibiotics Rash    Prior to Admission medications   Medication Sig Start Date End Date Taking? Authorizing Provider  acidophilus (RISAQUAD) CAPS capsule Take 1 capsule by mouth daily.   Yes [provider]  Ascorbic Acid (VITAMIN C) 1000 MG tablet Take 500 mg by mouth 3 (three) times daily. Reported on 06/14/2015   Yes [provider]  aspirin EC 81 MG tablet Take 81 mg by mouth daily.    Yes [provider]  Cholecalciferol 5000 units TABS Take 5,000 Units by mouth every morning.   Yes [provider]  Coenzyme Q10 (TH CO Q-10) 100 MG capsule Take 100 mg by mouth daily.    Yes [provider]  [place] 250 MCG tablet Take 250 mcg by mouth daily.   Yes [provider]  furosemide (LASIX) 20 MG tablet Take 20 mg by mouth every other day as needed (as needed for water retention).  05/10/15  Yes [provider]  gabapentin (NEURONTIN) 600 MG tablet Take 600 mg by mouth 2 (two) times daily.  Yes [provider]  hydrocortisone (CORTEF) 10 MG tablet Take 10 mg by mouth 2 (two) times daily.    Yes [provider]  ipratropium-albuterol (DUONEB) 0.5-2.5 (3) MG/3ML SOLN Take 3 mLs by nebulization every 6 (six) hours as needed. 06/12/16  Yes Simonds, David B, MD  levothyroxine (SYNTHROID, LEVOTHROID) 50 MCG tablet Take 50 mcg by mouth daily before breakfast.   Yes [provider]  MAGNESIUM-OXIDE 400 (241.3 Mg) MG tablet Take 400 mg by mouth daily.   Yes [provider]  methenamine (HIPREX) 1 g tablet TAKE 1 TABLET BY MOUTH TWICE DAILY WITH MEALS 07/31/16  Yes McGowan, Shannon A, PA-C  midodrine (PROAMATINE) 5 MG tablet Take 5 mg by mouth 3 (three) times daily with meals.   Yes [provider]  Multiple Vitamin (MULTIVITAMIN WITH MINERALS) TABS tablet Take 1 tablet by mouth daily.   Yes [provider]  pantoprazole (PROTONIX) 40 MG tablet Take 40 mg by mouth daily before breakfast.    Yes [provider]  potassium chloride (MICRO-K) 10 MEQ CR capsule Take 10 mEq by mouth daily as needed (only with lasix).    Yes [provider]  SAW PALMETTO-PUMPKIN SEED OIL PO Take 1 capsule by mouth daily.    Yes [provider]  simvastatin (ZOCOR) 20 MG tablet Take 20 mg by mouth daily at 6 PM.    Yes [provider]  vitamin B-12 (CYANOCOBALAMIN) 500 MCG tablet Take 500 mcg by mouth daily.    Yes [provider]  vitamin E 400 UNIT capsule Take 400 Units by mouth daily.    Yes [provider]     Results for orders placed or performed during the hospital encounter of 08/19/16 (from the past 48 hour(s))  Blood gas, arterial ([facility], [facility], [facility])     Status: Abnormal   Collection Time: 08/19/16  5:50 AM  Result Value Ref Range   FIO2 1.00    Delivery systems NON-REBREATHER OXYGEN MASK    pH, Arterial 7.36 7.350 - 7.450   pCO2 arterial 35 32.0 - 48.0 mmHg   pO2, Arterial 206 (H) 83.0 - 108.0 mmHg   Bicarbonate 19.8 (L) 20.0 - 28.0 mmol/L   Acid-base deficit 5.0 (H) 0.0 - 2.0 mmol/L   O2 Saturation 99.7 %   Patient temperature 37.0    Collection site LEFT RADIAL    Sample type ARTERIAL DRAW    Allens test (pass/fail) PASS PASS  Comprehensive metabolic panel     Status: Abnormal   Collection Time: 08/19/16  5:53 AM  Result Value Ref Range   Sodium 139 135 - 145 mmol/L   Potassium 4.2 3.5 - 5.1  mmol/L    Comment: HEMOLYSIS AT THIS LEVEL MAY AFFECT RESULT   Chloride 101 101 - 111 mmol/L   CO2 23 22 - 32 mmol/L   Glucose, Bld 179 (H) 65 - 99 mg/dL   BUN 19 6 - 20 mg/dL   Creatinine, Ser 1.33 (H) 0.61 - 1.24 mg/dL   Calcium 9.3 8.9 - 10.3 mg/dL   Total Protein 8.4 (H) 6.5 - 8.1 g/dL   Albumin 3.9 3.5 - 5.0 g/dL   AST 43 (H) 15 - 41 U/L    Comment: HEMOLYSIS AT THIS LEVEL MAY AFFECT RESULT   ALT 16 (L) 17 - 63 U/L   Alkaline Phosphatase 72 38 - 126 U/L   Total Bilirubin 1.0 0.3 - 1.2 mg/dL    Comment: HEMOLYSIS AT THIS LEVEL MAY   AFFECT RESULT   GFR calc non Af Amer 49 (L) >60 mL/min   GFR calc Af Amer 57 (L) >60 mL/min    Comment: (NOTE) The eGFR has been calculated using the CKD EPI equation. This calculation has not been validated in all clinical situations. eGFR's persistently <60 mL/min signify possible Chronic Kidney Disease.    Anion gap 15 5 - 15  CBC WITH DIFFERENTIAL     Status: Abnormal   Collection Time: 08/19/16  5:53 AM  Result Value Ref Range   WBC 21.2 (H) 3.8 - 10.6 K/uL   RBC 4.87 4.40 - 5.90 MIL/uL   Hemoglobin 14.1 13.0 - 18.0 g/dL   HCT 44.4 40.0 - 52.0 %   MCV 91.3 80.0 - 100.0 fL   MCH 29.0 26.0 - 34.0 pg   MCHC 31.8 (L) 32.0 - 36.0 g/dL   RDW 16.0 (H) 11.5 - 14.5 %   Platelets 273 150 - 440 K/uL   Neutrophils Relative % 80 %   Neutro Abs 17.1 (H) 1.4 - 6.5 K/uL   Lymphocytes Relative 13 %   Lymphs Abs 2.7 1.0 - 3.6 K/uL   Monocytes Relative 5 %   Monocytes Absolute 1.0 0.2 - 1.0 K/uL   Eosinophils Relative 2 %   Eosinophils Absolute 0.4 0 - 0.7 K/uL   Basophils Relative 0 %   Basophils Absolute 0.1 0 - 0.1 K/uL  Lactic acid, plasma     Status: Abnormal   Collection Time: 08/19/16  5:53 AM  Result Value Ref Range   Lactic Acid, Venous 6.9 (HH) 0.5 - 1.9 mmol/L    Comment: CRITICAL RESULT CALLED TO, READ BACK BY AND VERIFIED WITH SAMANTHA HAMILTON_0  ON 08/19/16 BY HKP   Lipase, blood     Status: None   Collection Time: 08/19/16   5:53 AM  Result Value Ref Range   Lipase 24 11 - 51 U/L  Troponin I     Status: None   Collection Time: 08/19/16  5:53 AM  Result Value Ref Range   Troponin I <0.03 <0.03 ng/mL  Brain natriuretic peptide     Status: Abnormal   Collection Time: 08/19/16  5:53 AM  Result Value Ref Range   B Natriuretic Peptide 179.0 (H) 0.0 - 100.0 pg/mL   Dg Chest Port 1 View  Result Date: 08/19/2016 CLINICAL DATA:  82 year old male with respiratory distress and vomiting. EXAM: PORTABLE CHEST 1 VIEW COMPARISON:  Chest radiograph dated 06/12/2016 FINDINGS: There is diffuse interstitial coarsening and right lung pleural thickening consistent with known fibrosis. There is increased airspace opacity involving the right lung with overall decreased aeration of the right lung. An area of increased density in the left mid lung field/left upper lobe adjacent to the pacemaker device appears new compared to prior study. A small pleural effusion is not excluded. There is no pneumothorax. Stable cardiac silhouette. Left pectoral pacemaker device and cardiac mechanical bowel noted. No acute osseous pathology. IMPRESSION: Findings of pulmonary fibrosis with overall increased airspace density and worsened aeration of the right lung. Left upper lobe density also appears new from prior study. Findings concerning for developing pneumonia on a background of chronic lung disease. Clinical correlation follow-up recommended. Electronically Signed   By: Anner Crete M.D.   On: 08/19/2016 06:27    Review of Systems  Constitutional: Positive for chills. Negative for fever.  HENT: Negative for sore throat and tinnitus.   Eyes: Negative for blurred vision and redness.  Respiratory: Positive for cough and shortness  of breath.   Cardiovascular: Negative for chest pain, palpitations, orthopnea and PND.  Gastrointestinal: Positive for nausea and vomiting. Negative for abdominal pain and diarrhea.  Genitourinary: Negative for dysuria,  frequency and urgency.  Musculoskeletal: Negative for joint pain and myalgias.  Skin: Negative for rash.       No lesions  Neurological: Negative for speech change, focal weakness and weakness.  Endo/Heme/Allergies: Does not bruise/bleed easily.       No temperature intolerance  Psychiatric/Behavioral: Negative for depression and suicidal ideas.    Blood pressure (!) 118/43, pulse (!) 44, temperature (!) 102.9 F (39.4 C), temperature source Rectal, resp. rate (!) 42, height 5' 5" (1.651 m), weight 81.6 kg (180 lb), SpO2 100 %. Physical Exam  Vitals reviewed. Constitutional: He is oriented to person, place, and time. He appears well-developed and well-nourished. No distress.  HENT:  Head: Normocephalic and atraumatic.  Mouth/Throat: Oropharynx is clear and moist.  Eyes: Pupils are equal, round, and reactive to light. Conjunctivae and EOM are normal. No scleral icterus.  Neck: Normal range of motion. Neck supple. No JVD present. No tracheal deviation present. No thyromegaly present.  Cardiovascular: Regular rhythm and normal heart sounds.  Bradycardia present.  Exam reveals no gallop and no friction rub.   No murmur heard. Respiratory: Tachypnea noted. He has decreased breath sounds in the right lower field.  GI: Soft. Bowel sounds are normal. He exhibits no distension. There is no tenderness.  Genitourinary:  Genitourinary Comments: Deferred  Musculoskeletal: Normal range of motion. He exhibits no edema.  Lymphadenopathy:    He has no cervical adenopathy.  Neurological: He is alert and oriented to person, place, and time. No cranial nerve deficit.  Skin: Skin is warm and dry. No rash noted. No erythema.  Psychiatric: He has a normal mood and affect. His behavior is normal. Judgment and thought content normal.     Assessment/Plan This is a 81-year-old male admitted for respiratory failure with hypoxia. 1. Respiratory failure: With hypoxia; acute on chronic. The patient is currently  on nonrebreather. We will monitor his respiratory status. 2. Sepsis: The patient meets criteria via tachypnea, leukocytosis and fever. The patient may have a postobstructive pneumonia. Continue levofloxacin and vancomycin. Follow blood cultures for growth and sensitivities. The patient is hemodynamically stable for now. 3. CHF: Diastolic; chronic. AICD in place. Continue furosemide and digoxin 4. Lung cancer: Whiteout of right lung. The patient is on palliative care. 5. Hypothyroidism: Check TSH; continue Synthroid 6. Diabetes mellitus type 2: Elevated hemoglobin A1c; sliding-scale insulin while hospitalized. Hold metformin. Continue gabapentin for neuropathy 7. Hyperlipidemia: Continue statin therapy 8. Episodic hypotension: Continue Midrin and hydrocortisone per outpatient regimen 9. UTI prophylaxis: Methenamine 10. DVT prophylaxis: Lovenox 11. GI prophylaxis: PPI per home regimen The patient is a DO NOT RESUSCITATE. Time spent on admission orders and patient care possibly 45 minutes  ,   S, MD 08/19/2016, 7:33 AM   

## 2016-08-19 NOTE — ED Notes (Signed)
Patient sitting at end of bed, stating "I can't breathe". O2 sat 84% on monitor with 6L [place] applied. Patient repositioned in bed and assisted to sit up fully. Patient refusing to wear non-rebreather at this time. Primary RN and MD made aware.

## 2016-08-19 NOTE — Progress Notes (Signed)
Advanced Care Plan.  Purpose of Encounter: CODE STATUS Parties in Attendance: The patient, his daughter-in-law and me. Patient's Decisional Capacity: Yes. Medical Story: This is a 82 year old male with past medical history of diastolic congestive heart failure, lung cancer with recurrent pleural effusion and chronic fibrosis admitted for respiratory failure with hypoxia, hypotension and sepsis. I discussed about the patient's current condition, plan of treatment and poor prognosis. The patient refused BiPAP and wants DO NOT RESUSCITATE.  Plan: continue O2 [place], no BIPAP. Palliative care consult if worse. Code Status: DO NOT RESUSCITATE. Time spent discussing advance care planning: 17 minutes.

## 2016-08-20 ENCOUNTER — Inpatient Hospital Stay: Payer: Medicare Other

## 2016-08-20 LAB — BASIC METABOLIC PANEL
ANION GAP: 7 (ref 5–15)
BUN: 24 mg/dL — ABNORMAL HIGH (ref 6–20)
CALCIUM: 8.7 mg/dL — AB (ref 8.9–10.3)
CO2: 24 mmol/L (ref 22–32)
Chloride: 106 mmol/L (ref 101–111)
Creatinine, Ser: 1.27 mg/dL — ABNORMAL HIGH (ref 0.61–1.24)
GFR calc Af Amer: >60 mL/min (ref >60)
GFR, EST NON AFRICAN AMERICAN: 52 mL/min — AB (ref >60)
GLUCOSE: 189 mg/dL — AB (ref 65–99)
Potassium: 4.6 mmol/L (ref 3.5–5.1)
Sodium: 137 mmol/L (ref 135–145)

## 2016-08-20 LAB — GLUCOSE, PLEURAL OR PERITONEAL FLUID: Glucose, Fluid: 187 mg/dL

## 2016-08-20 LAB — GLUCOSE, CAPILLARY
Glucose-Capillary: 283 mg/dL — ABNORMAL HIGH (ref 65–99)
Glucose-Capillary: 309 mg/dL — ABNORMAL HIGH (ref 65–99)
Glucose-Capillary: 466 mg/dL — ABNORMAL HIGH (ref 65–99)

## 2016-08-20 LAB — URINALYSIS, COMPLETE (UACMP) WITH MICROSCOPIC
Bacteria, UA: NONE SEEN
Bilirubin Urine: NEGATIVE
Glucose, UA: NEGATIVE mg/dL
Hgb urine dipstick: NEGATIVE
Ketones, ur: NEGATIVE mg/dL
Nitrite: NEGATIVE
Protein, ur: NEGATIVE mg/dL
Specific Gravity, Urine: 1.012 (ref 1.005–1.030)
pH: 6 (ref 5.0–8.0)

## 2016-08-20 LAB — BODY FLUID CELL COUNT WITH DIFFERENTIAL
Eos, Fluid: 0 %
LYMPHS FL: 65 %
MONOCYTE-MACROPHAGE-SEROUS FLUID: 5 %
Neutrophil Count, Fluid: 30 %
Total Nucleated Cell Count, Fluid: 1808 cu mm

## 2016-08-20 LAB — MRSA PCR SCREENING: MRSA by PCR: NEGATIVE

## 2016-08-20 LAB — CBC
HEMATOCRIT: 36.6 % — AB (ref 40.0–52.0)
Hemoglobin: 12.1 g/dL — ABNORMAL LOW (ref 13.0–18.0)
MCH: 30.2 pg (ref 26.0–34.0)
MCHC: 33.2 g/dL (ref 32.0–36.0)
MCV: 91.2 fL (ref 80.0–100.0)
Platelets: 164 10*3/uL (ref 150–440)
RBC: 4.01 MIL/uL — ABNORMAL LOW (ref 4.40–5.90)
RDW: 16.2 % — AB (ref 11.5–14.5)
WBC: 18.2 10*3/uL — ABNORMAL HIGH (ref 3.8–10.6)

## 2016-08-20 LAB — HEMOGLOBIN A1C
Hgb A1c MFr Bld: 7.4 % — ABNORMAL HIGH (ref 4.8–5.6)
Mean Plasma Glucose: 166 mg/dL

## 2016-08-20 LAB — MAGNESIUM: Magnesium: 2.4 mg/dL (ref 1.7–2.4)

## 2016-08-20 LAB — LACTIC ACID, PLASMA: Lactic Acid, Venous: 2.5 mmol/L (ref 0.5–1.9)

## 2016-08-20 LAB — LACTATE DEHYDROGENASE, PLEURAL OR PERITONEAL FLUID: LD FL: 215 U/L — AB (ref 3–23)

## 2016-08-20 MED ORDER — INSULIN ASPART 100 UNIT/ML ~~LOC~~ SOLN: 0.0000 [IU] | Freq: Every day | SUBCUTANEOUS | Status: DC

## 2016-08-20 MED ORDER — HYDROCOD POLST-CPM POLST ER 10-8 MG/5ML PO SUER
5.0000 mL | Freq: Two times a day (BID) | ORAL | Status: DC
  Administered 2016-08-20 – 2016-08-25 (×11): 5 mL via ORAL
  Filled 2016-08-20 (×11): qty 5

## 2016-08-20 MED ORDER — INSULIN ASPART 100 UNIT/ML ~~LOC~~ SOLN
0.0000 [IU] | Freq: Three times a day (TID) | SUBCUTANEOUS | Status: DC
  Administered 2016-08-20: 7 [IU] via SUBCUTANEOUS
  Administered 2016-08-20: 5 [IU] via SUBCUTANEOUS
  Administered 2016-08-21: 7 [IU] via SUBCUTANEOUS
  Filled 2016-08-20 (×3): qty 1

## 2016-08-20 MED ORDER — DEXTROSE 5 % IV SOLN
2.0000 g | INTRAVENOUS | Status: DC
  Administered 2016-08-21: 2 g via INTRAVENOUS
  Filled 2016-08-20: qty 2

## 2016-08-20 MED ORDER — FUROSEMIDE 10 MG/ML IJ SOLN
INTRAMUSCULAR | Status: AC
  Administered 2016-08-20: 20 mg via INTRAVENOUS
  Filled 2016-08-20: qty 2

## 2016-08-20 MED ORDER — IPRATROPIUM-ALBUTEROL 0.5-2.5 (3) MG/3ML IN SOLN
3.0000 mL | Freq: Once | RESPIRATORY_TRACT | Status: AC
  Administered 2016-08-20: 3 mL via RESPIRATORY_TRACT

## 2016-08-20 MED ORDER — IPRATROPIUM-ALBUTEROL 0.5-2.5 (3) MG/3ML IN SOLN
RESPIRATORY_TRACT | Status: AC
  Filled 2016-08-20: qty 3

## 2016-08-20 MED ORDER — METHENAMINE MANDELATE 1 G PO TABS
1000.0000 mg | ORAL_TABLET | Freq: Four times a day (QID) | ORAL | Status: DC
  Administered 2016-08-20 – 2016-08-30 (×36): 1000 mg via ORAL
  Filled 2016-08-20 (×42): qty 1

## 2016-08-20 MED ORDER — METHYLPREDNISOLONE SODIUM SUCC 125 MG IJ SOLR
60.0000 mg | Freq: Four times a day (QID) | INTRAMUSCULAR | Status: DC
  Administered 2016-08-20 – 2016-08-21 (×4): 60 mg via INTRAVENOUS
  Filled 2016-08-20 (×4): qty 2

## 2016-08-20 MED ORDER — DEXTROSE 5 % IV SOLN
2.0000 g | Freq: Two times a day (BID) | INTRAVENOUS | Status: DC
  Administered 2016-08-20 (×2): 2 g via INTRAVENOUS
  Filled 2016-08-20 (×3): qty 2

## 2016-08-20 MED ORDER — ENOXAPARIN SODIUM 40 MG/0.4ML ~~LOC~~ SOLN
40.0000 mg | SUBCUTANEOUS | Status: DC
  Administered 2016-08-20 – 2016-08-26 (×7): 40 mg via SUBCUTANEOUS
  Filled 2016-08-20 (×8): qty 0.4

## 2016-08-20 MED ORDER — INSULIN ASPART 100 UNIT/ML ~~LOC~~ SOLN
6.0000 [IU] | Freq: Once | SUBCUTANEOUS | Status: AC
  Administered 2016-08-20: 6 [IU] via SUBCUTANEOUS
  Filled 2016-08-20: qty 0.06

## 2016-08-20 MED ORDER — FUROSEMIDE 10 MG/ML IJ SOLN
20.0000 mg | Freq: Once | INTRAMUSCULAR | Status: AC
  Administered 2016-08-20: 20 mg via INTRAVENOUS

## 2016-08-20 NOTE — Progress Notes (Signed)
Notified MD of lactic acid of 2.5. No new orders at this time. Will continue to monitor and assess.

## 2016-08-20 NOTE — Progress Notes (Signed)
Russell Howard at [place] NAME: Russell Howard    MR#:  [id number]  DATE OF BIRTH:  03-23-1934  SUBJECTIVE:  He is admitted for acute respiratory failure with bilateral pneumonia. He is feeling worse today has more respiratory distress, oxygen saturation 89% on 6 L. But he does not want to use BiPAP or nonrebreather mask. He is a DO NOT RESUSCITATE and does not want intubation. He is alert, awake, oriented. Has lots of cough. And chest x-ray showed bilateral pleural effusion, bilateral pneumonia.   CHIEF COMPLAINT:   Chief Complaint  Patient presents with  . Respiratory Distress    REVIEW OF SYSTEMS:   ROS CONSTITUTIONAL: Lots of cough today, congestion and respiratory distress per.  EYES: No blurred or double vision.  EARS, NOSE, AND THROAT: No tinnitus or ear pain.  RESPIRATORY:  Has cough, shortness of breath, wheezing   CARDIOVASCULAR: No chest pain, orthopnea, edema.  GASTROINTESTINAL: No nausea, vomiting, diarrhea or abdominal pain.  GENITOURINARY: No dysuria, hematuria.  ENDOCRINE: No polyuria, nocturia,  HEMATOLOGY: No anemia, easy bruising or bleeding SKIN: No rash or lesion. MUSCULOSKELETAL: No joint pain or arthritis.   NEUROLOGIC: No tingling, numbness, weakness.  PSYCHIATRY: No anxiety or depression.   DRUG ALLERGIES:   Allergies  Allergen Reactions  . Penicillin G Hives    Has patient had a PCN reaction causing immediate rash, facial/tongue/throat swelling, SOB or lightheadedness with hypotension: Yes Has patient had a PCN reaction causing severe rash involving mucus membranes or skin necrosis: No Has patient had a PCN reaction that required hospitalization No Has patient had a PCN reaction occurring within the last 10 years: No If all of the above answers are "NO", then may proceed with Cephalosporin use.  . Sulfa Antibiotics Rash    VITALS:  Blood pressure (!) 107/44, pulse 83, temperature 98.2 F (36.8 C),  temperature source Oral, resp. rate 18, height 5\' 5"  (1.651 m), weight 85.7 kg (188 lb 14.4 oz), SpO2 (!) 89 %.  PHYSICAL EXAMINATION:  GENERAL:  82 y.o.-year-old patient lying in the bed with Respiratory distress, unable to  Speak full sentences and he says he  Is claustrophobic,does not want facemask or nonrebreather or intubation. And he also says he does not want BiPAP. EYES: Pupils equal, round, reactive to light and accommodation. No scleral icterus. Extraocular muscles intact.  HEENT: Head atraumatic, normocephalic. Oropharynx and nasopharynx clear.  NECK:  Supple, no jugular venous distention. No thyroid enlargement, no tenderness.  LUNGS: Coarse breath sounds bilaterally, not using accessory muscles at this time  CARDIOVASCULAR: S1, S2 normal. No murmurs, rubs, or gallops.  ABDOMEN: Soft, nontender, nondistended. Bowel sounds present. No organomegaly or mass.  EXTREMITIES: No pedal edema, cyanosis, or clubbing.  NEUROLOGIC: Cranial nerves II through XII are intact. Muscle strength 5/5 in all extremities. Sensation intact. Gait not checked.  PSYCHIATRIC: The patient is alert and oriented x 3.  SKIN: No obvious rash, lesion, or ulcer.    LABORATORY PANEL:   CBC  Recent Labs Lab 08/20/16 1041  WBC 18.2*  HGB 12.1*  HCT 36.6*  PLT 164   ------------------------------------------------------------------------------------------------------------------  Chemistries   Recent Labs Lab 08/19/16 0553  08/20/16 0135  NA 139  --  137  K 4.2  --  4.6  CL 101  --  106  CO2 23  --  24  GLUCOSE 179*  --  189*  BUN 19  --  24*  CREATININE 1.33*  --  1.27*  CALCIUM 9.3  --  8.7*  MG  --   < > 2.4  AST 43*  --   --   ALT 16*  --   --   ALKPHOS 72  --   --   BILITOT 1.0  --   --   < > = values in this interval not displayed. ------------------------------------------------------------------------------------------------------------------  Cardiac Enzymes  Recent Labs Lab  08/19/16 0553  TROPONINI <0.03   ------------------------------------------------------------------------------------------------------------------  RADIOLOGY:  Dg Chest Port 1 View  Result Date: 08/20/2016 CLINICAL DATA:  Initial evaluation for acute cough. EXAM: PORTABLE CHEST 1 VIEW COMPARISON:  Prior radiograph from earlier the same day. FINDINGS: Left-sided pacemaker/ AICD noted. Grossly stable cardiac and mediastinal silhouettes. Diffuse interstitial coarsening consistent with known pulmonary fibrosis. Worsening parenchymal opacity within the left mid lung. Diffuse opacity throughout the right lung also worsen, with decreased aeration. Possible small pleural effusions. Underlying increased pulmonary interstitial prominence as compared to previous. No pneumothorax. Osseous structures unchanged. IMPRESSION: 1. Interval worsening and widespread right lung and mid left lung opacities, with increased interstitial prominence as compared to prior. Findings may reflect increased pulmonary interstitial edema superimposed on background chronic interstitial lung changes. Worsened infection/pneumonia could also be considered. 2. Suspected small bilateral pleural effusions. Electronically Signed   By: Jeannine Boga M.D.   On: 08/20/2016 00:15   Dg Chest Port 1 View  Result Date: 08/19/2016 CLINICAL DATA:  82 year old male with respiratory distress and vomiting. EXAM: PORTABLE CHEST 1 VIEW COMPARISON:  Chest radiograph dated 06/12/2016 FINDINGS: There is diffuse interstitial coarsening and right lung pleural thickening consistent with known fibrosis. There is increased airspace opacity involving the right lung with overall decreased aeration of the right lung. An area of increased density in the left mid lung field/left upper lobe adjacent to the pacemaker device appears new compared to prior study. A small pleural effusion is not excluded. There is no pneumothorax. Stable cardiac silhouette. Left  pectoral pacemaker device and cardiac mechanical bowel noted. No acute osseous pathology. IMPRESSION: Findings of pulmonary fibrosis with overall increased airspace density and worsened aeration of the right lung. Left upper lobe density also appears new from prior study. Findings concerning for developing pneumonia on a background of chronic lung disease. Clinical correlation follow-up recommended. Electronically Signed   By: Anner Crete M.D.   On: 08/19/2016 06:27    EKG:   Orders placed or performed during the hospital encounter of 08/19/16  . ED EKG 12-Lead  . ED EKG 12-Lead  . EKG 12-Lead  . EKG 12-Lead  . EKG    ASSESSMENT AND PLAN:   Acute respiratory failure secondary to bilateral pneumonia, acute on chronic systolic heart failure. Continue oxygen to keep sats around 89-90%, added IV Lasix, continue IV steroids, empiric antibiotics, bronchodilators, patient says that he had thoracocentesis done before with improvement, I ordered) thoracocentesis to see if it helps him, continue oxygen. Patient refused BiPAP, facemask, noninjected. He is a DO NOT RESUSCITATE and does not want intubation. Discussed with patient, patient's daughter at bedside in detail. [place] and [place] issues with [place] medical records but he told me that in the past he had thoracocentesis multiple times and had relief with that.  #2. . Nonischemic cardiomyopathy, chronic systolic heart failure with unknown baseline check last 2-3. History of AICD placement history of chronic atrial fibrillation, history of metastatic lung cancer status post treatment. Previous echo showed EF 45%. Patient says Dr. Ubaldo Glassing from [facility] cardiology.   #3  COPD exacerbation: Continue oxygen, IV steroids, bronchodilators.  4,bilateral pleural effusions, history of lung cancer. Patient [place] out of state, no medical records in care everywhere.   #5 diabetes mellitus type 2. sen by diabetes coordinator, continue  NovoLog 0-9 units 3 times a day with meals, NovoLog 0-5 units daily at bedtime D/w daughter  Prognosis is poor  All the records are reviewed and case discussed with Care Management/Social Workerr. Management plans discussed with the patient, family and they are in agreement.  CODE STATUS: DNR  TOTAL TIME TAKING CARE OF THIS PATIENT: 35 minutes.   POSSIBLE D/C IN 2-3 DAYS, DEPENDING ON CLINICAL CONDITION.   Epifanio Lesches M.D on 08/20/2016 at 11:24 AM  Between 7am to 6pm - Pager - [phone]  After 6pm go to [email or web address] - password [email or web address] [facility]  Office  [phone]  CC: Primary care physician; [name], MD   Note: This dictation was prepared with Dragon dictation along with smaller phrase technology. Any transcriptional errors that result from this process are unintentional.

## 2016-08-20 NOTE — Procedures (Signed)
Interventional Radiology Procedure Note  Procedure:  US guided right thoracentesis  Complications: None  Estimated Blood Loss: < 10 mL  Findings:  Turbid, purulent right pleural fluid.  400 mL removed.  Unable to tolerate further removal due to pain.  Venetia Night. Kathlene Cote, M.D Pager:  [phone]

## 2016-08-20 NOTE — Progress Notes (Signed)
Pt placed on non rebreather mask r/t o2 sats 86% and shortness of breath. O2 sats 92 % after mask applied. MD made aware. IV solumedrol ordered. One time dose lasix and duoneb given with some relief noted. Thoracentesis ordered per MD.

## 2016-08-20 NOTE — Progress Notes (Signed)
PT Cancellation Note  Patient Details Name: Russell Howard MRN: [id number] DOB: 13-Aug-1934   Cancelled Treatment:    Reason Eval/Treat Not Completed: Medical issues which prohibited therapy (Consult received and chart reviewed.  Patient with worsening respiratory status since admission; pending thoracentesis this date.  Will hold therapy at this time (RN in agreement) and re-attempt next date as patient available and medically appropriate.)   Terecia Plaut H. Owens Shark, PT, DPT, NCS 08/20/16, 12:22 PM [phone]

## 2016-08-20 NOTE — Progress Notes (Signed)
Cefepime dose modified to 2 gm IV Q24H d/t creatinine clearance 30 to 50 mL/min.  Jeanmarc Viernes A. [place], [place].D., BCPS Clinical Pharmacist 08/20/2016 1152

## 2016-08-20 NOTE — Progress Notes (Signed)
Pt with SOB. Pt's sats in high 80. NS stopped. MD notified. Orders placed. Will continue to monitor and assess.

## 2016-08-20 NOTE — Progress Notes (Signed)
Inpatient Diabetes Program Recommendations  AACE/ADA: New Consensus Statement on Inpatient Glycemic Control (2015)  Target Ranges:  Prepandial:   less than 140 mg/dL      Peak postprandial:   less than 180 mg/dL (1-2 hours)      Critically ill patients:  140 - 180 mg/dL   Results for SHAIDEN, ALDOUS (MRN [id number]) as of 08/20/2016 09:35  Ref. Range 08/19/2016 14:00 08/20/2016 01:35  Glucose Latest Ref Range: 65 - 99 mg/dL  189 (H)  Hemoglobin A1C Latest Ref Range: 4.8 - 5.6 % 7.4 (H)    Review of Glycemic Control  Diabetes history: DM2 Outpatient Diabetes medications: None Current orders for Inpatient glycemic control: None  Inpatient Diabetes Program Recommendations: Correction (SSI): While inpatient, please consider ordering CBGs with Novolog 0-9 units TID with meals and Novolog 0-5 QHS.  Thanks, Barnie Alderman, RN, MSN, CDE Diabetes Coordinator Inpatient Diabetes Program [phone] (Team Pager from 8am to 5pm)

## 2016-08-20 NOTE — Progress Notes (Signed)
Pharmacy Antibiotic Note  Russell Howard is a 82 y.o. male admitted on 08/19/2016 with sepsis.  Pharmacy has been consulted for cefepime dosing.  Plan: Will start patient on cefepime 2g IV q12h  Height: 5\' 5"  (165.1 cm) Weight: 180 lb (81.6 kg) IBW/kg (Calculated) : 61.5  Temp (24hrs), Avg:99.8 F (37.7 C), Min:98.2 F (36.8 C), Max:102.9 F (39.4 C)   Recent Labs Lab 08/19/16 0553 08/19/16 0857  WBC 21.2*  --   CREATININE 1.33*  --   LATICACIDVEN 6.9* 3.2*    Estimated Creatinine Clearance: 44.3 mL/min (A) (by C-G formula based on SCr of 1.33 mg/dL (H)).    Allergies  Allergen Reactions  . Penicillin G Hives    Has patient had a PCN reaction causing immediate rash, facial/tongue/throat swelling, SOB or lightheadedness with hypotension: Yes Has patient had a PCN reaction causing severe rash involving mucus membranes or skin necrosis: No Has patient had a PCN reaction that required hospitalization No Has patient had a PCN reaction occurring within the last 10 years: No If all of the above answers are "NO", then may proceed with Cephalosporin use.  . Sulfa Antibiotics Rash     Thank you for allowing pharmacy to be a part of this patient's care.  Tobie Lords 08/20/2016 1:28 AM

## 2016-08-21 DIAGNOSIS — J9621 Acute and chronic respiratory failure with hypoxia: Secondary | ICD-10-CM

## 2016-08-21 DIAGNOSIS — J701 Chronic and other pulmonary manifestations due to radiation: Secondary | ICD-10-CM

## 2016-08-21 DIAGNOSIS — J471 Bronchiectasis with (acute) exacerbation: Secondary | ICD-10-CM

## 2016-08-21 DIAGNOSIS — J90 Pleural effusion, not elsewhere classified: Secondary | ICD-10-CM

## 2016-08-21 LAB — GLUCOSE, CAPILLARY
GLUCOSE-CAPILLARY: 248 mg/dL — AB (ref 65–99)
Glucose-Capillary: 319 mg/dL — ABNORMAL HIGH (ref 65–99)
Glucose-Capillary: 364 mg/dL — ABNORMAL HIGH (ref 65–99)
Glucose-Capillary: 335 mg/dL — ABNORMAL HIGH (ref 65–99)

## 2016-08-21 LAB — URINE CULTURE: Culture: NO GROWTH

## 2016-08-21 LAB — PROCALCITONIN: Procalcitonin: 6.11 ng/mL

## 2016-08-21 LAB — PATHOLOGIST SMEAR REVIEW

## 2016-08-21 MED ORDER — DEXTROSE 5 % IV SOLN
2.0000 g | Freq: Two times a day (BID) | INTRAVENOUS | Status: DC
  Administered 2016-08-21 – 2016-08-26 (×11): 2 g via INTRAVENOUS
  Filled 2016-08-21 (×12): qty 2

## 2016-08-21 MED ORDER — METHYLPREDNISOLONE SODIUM SUCC 40 MG IJ SOLR
40.0000 mg | Freq: Two times a day (BID) | INTRAMUSCULAR | Status: DC
  Administered 2016-08-21 – 2016-08-22 (×2): 40 mg via INTRAVENOUS
  Filled 2016-08-21 (×2): qty 1

## 2016-08-21 MED ORDER — INSULIN ASPART 100 UNIT/ML ~~LOC~~ SOLN
0.0000 [IU] | Freq: Three times a day (TID) | SUBCUTANEOUS | Status: DC
  Administered 2016-08-21: 11 [IU] via SUBCUTANEOUS
  Administered 2016-08-22: 8 [IU] via SUBCUTANEOUS
  Administered 2016-08-22: 11 [IU] via SUBCUTANEOUS
  Administered 2016-08-22 – 2016-08-23 (×2): 8 [IU] via SUBCUTANEOUS
  Administered 2016-08-23 (×2): 3 [IU] via SUBCUTANEOUS
  Administered 2016-08-24: 5 [IU] via SUBCUTANEOUS
  Administered 2016-08-24: 2 [IU] via SUBCUTANEOUS
  Administered 2016-08-24: 8 [IU] via SUBCUTANEOUS
  Administered 2016-08-25: 2 [IU] via SUBCUTANEOUS
  Administered 2016-08-25: 3 [IU] via SUBCUTANEOUS
  Administered 2016-08-25: 5 [IU] via SUBCUTANEOUS
  Administered 2016-08-26: 1 [IU] via SUBCUTANEOUS
  Administered 2016-08-26: 2 [IU] via SUBCUTANEOUS
  Administered 2016-08-26: 1 [IU] via SUBCUTANEOUS
  Administered 2016-08-27 – 2016-08-28 (×4): 3 [IU] via SUBCUTANEOUS
  Administered 2016-08-29: 5 [IU] via SUBCUTANEOUS
  Administered 2016-08-29: 2 [IU] via SUBCUTANEOUS
  Administered 2016-08-29: 5 [IU] via SUBCUTANEOUS
  Administered 2016-08-30: 3 [IU] via SUBCUTANEOUS
  Administered 2016-08-30: 2 [IU] via SUBCUTANEOUS
  Filled 2016-08-21 (×25): qty 1

## 2016-08-21 MED ORDER — INSULIN GLARGINE 100 UNIT/ML ~~LOC~~ SOLN
10.0000 [IU] | Freq: Every day | SUBCUTANEOUS | Status: DC
  Administered 2016-08-21 – 2016-08-22 (×2): 10 [IU] via SUBCUTANEOUS
  Filled 2016-08-21 (×3): qty 0.1

## 2016-08-21 MED ORDER — INSULIN ASPART 100 UNIT/ML ~~LOC~~ SOLN
0.0000 [IU] | Freq: Every day | SUBCUTANEOUS | Status: DC
  Administered 2016-08-21: 2 [IU] via SUBCUTANEOUS
  Administered 2016-08-22: 3 [IU] via SUBCUTANEOUS
  Administered 2016-08-23: 4 [IU] via SUBCUTANEOUS
  Filled 2016-08-21 (×3): qty 1

## 2016-08-21 MED ORDER — VANCOMYCIN HCL 10 G IV SOLR
1250.0000 mg | INTRAVENOUS | Status: DC
  Administered 2016-08-21: 1250 mg via INTRAVENOUS
  Filled 2016-08-21 (×2): qty 1250

## 2016-08-21 NOTE — Progress Notes (Signed)
Inpatient Diabetes Program Recommendations  AACE/ADA: New Consensus Statement on Inpatient Glycemic Control (2015)  Target Ranges:  Prepandial:   less than 140 mg/dL      Peak postprandial:   less than 180 mg/dL (1-2 hours)      Critically ill patients:  140 - 180 mg/dL   Results for ROSARIO, KUSHNER (MRN [id number]) as of 08/21/2016 09:18  Ref. Range 08/20/2016 12:44 08/20/2016 16:50 08/20/2016 21:40 08/21/2016 07:47  Glucose-Capillary Latest Ref Range: 65 - 99 mg/dL 283 (H) 309 (H) 466 (H) 319 (H)   Review of Glycemic Control  Diabetes history: DM2 Outpatient Diabetes medications: None Current orders for Inpatient glycemic control: Novolog 0-9 units TID with meals, Novolog 0-5 units QHS  Inpatient Diabetes Program Recommendations:  Insulin - Basal: If steroids are continued, please consider ordering Lantus 10 units Q24H starting now. Diet: Please consider discontinuing Regular diet and ordering Carb Modified diet. Insulin-Meal Coverage: If steroids are continued and post prandial glucose is consistently elevated, please consider ordering Novolog 4 units TID with meals for meal coverage if patient eats at least 50% of meals.  Thanks, Barnie Alderman, RN, MSN, CDE Diabetes Coordinator Inpatient Diabetes Program [phone] (Team Pager from 8am to 5pm)

## 2016-08-21 NOTE — Consult Note (Signed)
PULMONARY CONSULT NOTE  Requesting MD/Service: Hospitalist service Date of consultation: 08/21/16 Reason for consultation: Acute on chronic hypoxemic respiratory failure  PT PROFILE: 82 y.o. male with the following problems  PROBLEMS:  History of lung cancer, S/P XRT to R lung 2015 Extensive R lung radiation fibrosis  Chronic R pleural effusion Chronic hypoxemic respiratory failure Hospitalized 01/28-01/31/18 for severe sepsis and presumed PNA Hospitalized 08/19/16 with acute on chronic respiratory failure  DATA: CT chest 11/10/15: Persistent volume loss, prominent fibrosis, bronchiectasis and interstitial infiltrates in the right upper lobe, with similar but less severe changes in the remaining of the right lung parenchyma. Increasing perifissural and peripleural nodular and linear interstitial thickening in the left lung.  HPI:  As above. He was admitted 07/17 with to 3 days of increasing dyspnea and rattling cough. Denies fever and chest pain. His cough is largely nonproductive. He denies hemoptysis. He denies lower extremity edema and calf tenderness. He underwent thoracentesis 07/18 with 400 mL of "cloudy purulent fluid" removed. LDH is mildly elevated at 2:15 on that fluid. WBC count was 1808 with 65% lymphocytes.  Past Medical History:  Diagnosis Date  . Acquired hypothyroidism 11/02/2014  . BPH (benign prostatic hyperplasia)   . Cardiac defibrillator in place 11/02/2014  . Cardiomyopathy ([place])   . CHF (congestive heart failure) ([place])   . Chronic diastolic heart failure ([place]) 11/02/2014  . Gastro-esophageal reflux disease without esophagitis 11/02/2014  . H/O malignant neoplasm 11/09/2014  . Heart valve disease 11/02/2014  . History of atrial fibrillation 11/02/2014  . Lung cancer ([place])   . Neuropathy 11/02/2014  . Orthostasis 11/02/2014  . Pure hypercholesterolemia 11/02/2014  . Valvular heart disease     Past Surgical History:  Procedure Laterality Date  . APPENDECTOMY     . DUAL ICD IMPLANT  2007/2009   implantable cardiac defibrillator  . PORT-A-CATH REMOVAL      MEDICATIONS: I have reviewed all medications and confirmed regimen as documented  Social History   Social History  . Marital status: Widowed    Spouse name: N/A  . Number of children: N/A  . Years of education: N/A   Occupational History  . Not on file.   Social History Main Topics  . Smoking status: Former Smoker    Types: Cigarettes    Quit date: 11/21/1981  . Smokeless tobacco: Never Used  . Alcohol use No  . Drug use: No  . Sexual activity: Not Currently   Other Topics Concern  . Not on file   Social History Narrative  . No narrative on file    Family History  Problem Relation Age of Onset  . Hypertension Unknown   . Heart disease Unknown     ROS: No fever, myalgias/arthralgias, unexplained weight loss or weight gain No new focal weakness or sensory deficits No otalgia, hearing loss, visual changes, nasal and sinus symptoms, mouth and throat problems No neck pain or adenopathy No abdominal pain, N/V/D, diarrhea, change in bowel pattern No dysuria, change in urinary pattern   Vitals:   08/21/16 0804 08/21/16 1000 08/21/16 1126 08/21/16 1533  BP:   (!) 123/46   Pulse:   99   Resp:   20   Temp:   97.9 F (36.6 C)   TempSrc:   Oral   SpO2: 98% 93% 90% (!) 83%  Weight:      Height:         EXAM:  Gen: No overt respiratory distress At rest HEENT: NCAT, sclera white, oropharynx  normal Neck: Supple without LAN, thyromegaly, JVD Lungs: breath sounds are largely absent on the right. There are a few rhonchi noted. Mostly clear on the left. No wheezes noted. Cardiovascular: RRR, no murmurs noted Abdomen: Soft, nontender, normal BS Ext: without clubbing, cyanosis, edema Neuro: CNs grossly intact, motor and sensory intact Skin: Limited exam, no lesions noted  DATA:   BMP Latest Ref Rng & Units 08/20/2016 08/19/2016 03/05/2016  Glucose 65 - 99 mg/dL 189(H)  179(H) 111(H)  BUN 6 - 20 mg/dL 24(H) 19 14  Creatinine 0.61 - 1.24 mg/dL 1.27(H) 1.33(H) 0.85  Sodium 135 - 145 mmol/L 137 139 139  Potassium 3.5 - 5.1 mmol/L 4.6 4.2 4.3  Chloride 101 - 111 mmol/L 106 101 104  CO2 22 - 32 mmol/L 24 23 29   Calcium 8.9 - 10.3 mg/dL 8.7(L) 9.3 9.0    CBC Latest Ref Rng & Units 08/20/2016 08/19/2016 03/05/2016  WBC 3.8 - 10.6 K/uL 18.2(H) 21.2(H) 7.3  Hemoglobin 13.0 - 18.0 g/dL 12.1(L) 14.1 11.7(L)  Hematocrit 40.0 - 52.0 % 36.6(L) 44.4 34.4(L)  Platelets 150 - 440 K/uL 164 273 160    CXR:  Chronic, extensive opacification of the right hemithorax with volume loss  IMPRESSION:   1) acute on chronic hypoxemic respiratory failure 2) history of lung cancer, status post XRT 3) extensive radiation fibrosis of right lung and chronic right pleural effusion 4) the acute illness might represent an acute pulmonary infection  His right lung is essentially obliterated with radiation fibrosis. There are extensive fibrocystic and bronchiectatic changes making him susceptible to repeated lung infections. The right pleural effusion is chronic and the fluid characteristics are not suggestive of empyema. I suspect that his right lung is incapable of fully reinflating and therefore, there is little benefit to attempting further drainage of his right pleural space.   PLAN:  Treat as acute pneumonia/bronchiectasis exacerbation:  1) Continue cefepime while hospitalized 2) would transition to levofloxacin and complete a 14 day course (through 07/31) 3) Continue supplemental oxygen to maintain SPO2 greater than 90% 4) I will schedule him for follow-up in the office in 4-6 weeks. At that time we will consider putting him on a regimen of cycling antibiotics (e.g. levofloxacin for the first 7 days of each month)   PCCM will sign off. Please call if we can be of further assistance  Merton Border, MD PCCM service Mobile [phone] Pager [phone] 08/21/2016

## 2016-08-21 NOTE — Evaluation (Signed)
Physical Therapy Evaluation Patient Details Name: Russell Howard MRN: [id number] DOB: Nov 01, 1934 Today's Date: 08/21/2016   History of Present Illness  presented to ER secondary to SOB, requiring BiPAP upon admission; admitted with sepsis related to bilat PNA.  Status post L thoracentesis (7/18) with removal of 411mL fluid.  Clinical Impression  Initially declining participation with therapy session, but agreeable with goal of trying to get to [facility].  Patient generally weak and deconditioned throughout all extremities, requiring assist from therapist to complete all functional activities (mod assist with supine/sit, close sup for unsupported sitting balance).  With transition to upright/minimal exertion, patient with marked SOB, desat to 83-84% on 4L supplemental O2, requiring return to supine and 1-2 min pursed lip breathing for recovery to >90%.  Additional activity/therex deferred as result; encouraged for bedpan use if toileting needs persisted (patient declining currently).  Will continue efforts and mobility assessment/progression as medically appropriate in subsequent sessions. Would benefit from skilled PT to address above deficits and promote optimal return to PLOF; recommend transition to STR upon discharge from acute hospitalization (if patient unable to consistently tolerate level of intensity required at facility).      Follow Up Recommendations SNF    Equipment Recommendations       Recommendations for Other Services       Precautions / Restrictions Precautions Precautions: Fall;ICD/Pacemaker Restrictions Weight Bearing Restrictions: No      Mobility  Bed Mobility Overal bed mobility: Needs Assistance Bed Mobility: Supine to Sit;Sit to Supine     Supine to sit: Mod assist Sit to supine: Mod assist   General bed mobility comments: assist for truncal elevation  Transfers                 General transfer comment: unsafe/unable due to desaturation with  transition to upright/minimal exertion  Ambulation/Gait             General Gait Details: unsafe/unable due to desaturation with transition to upright/minimal exertion  Stairs            Wheelchair Mobility    Modified Rankin (Stroke Patients Only)       Balance Overall balance assessment: Needs assistance Sitting-balance support: No upper extremity supported;Feet supported Sitting balance-Leahy Scale: Good Sitting balance - Comments: static sitting balance with close sup       Standing balance comment: unsafe/unable due to desaturation with transition to upright/minimal exertion                             Pertinent Vitals/Pain Pain Assessment: No/denies pain    Home Living Family/patient expects to be discharged to:: Private residence Living Arrangements: Children Available Help at Discharge: Family;Available 24 hours/day Type of Home: House Home Access: Ramped entrance     Home Layout: Two level;Able to live on main level with bedroom/bathroom Home Equipment: Gilford Rile - 2 wheels;Wheelchair - manual      Prior Function Level of Independence: Needs assistance         Comments: Assist as needed from daughter in law for ADLs (bed-level sponge bath), limited household mobility; utilizes manual WC (pushed by family) for community distances.  Home O2 at 2L continuously; denies fall history.     Hand Dominance        Extremity/Trunk Assessment   Upper Extremity Assessment Upper Extremity Assessment: Generalized weakness    Lower Extremity Assessment Lower Extremity Assessment: Generalized weakness (grossly 3-/5 throughout)       Communication  Communication: No difficulties  Cognition Arousal/Alertness: Awake/alert Behavior During Therapy: WFL for tasks assessed/performed Overall Cognitive Status: Within Functional Limits for tasks assessed                                        General Comments       Exercises Other Exercises Other Exercises: Unable to tolerate due to desaturation with transition to upright/minimal exertion   Assessment/Plan    PT Assessment Patient needs continued PT services  PT Problem List Decreased strength;Decreased range of motion;Decreased activity tolerance;Decreased balance;Decreased mobility;Decreased coordination;Decreased knowledge of use of DME;Decreased safety awareness;Decreased knowledge of precautions;Cardiopulmonary status limiting activity       PT Treatment Interventions DME instruction;Functional mobility training;Therapeutic activities;Therapeutic exercise;Balance training;Patient/family education    PT Goals (Current goals can be found in the Care Plan section)  Acute Rehab PT Goals Patient Stated Goal: to get to the [facility] for toileting PT Goal Formulation: With patient/family Time For Goal Achievement: 09/04/16 Potential to Achieve Goals: Fair Additional Goals Additional Goal #1: Assess and establish goals for OOB activities as medically appropriate.    Frequency Min 2X/week   Barriers to discharge        Co-evaluation               AM-PAC PT "6 Clicks" Daily Activity  Outcome Measure Difficulty turning over in bed (including adjusting bedclothes, sheets and blankets)?: Total Difficulty moving from lying on back to sitting on the side of the bed? : Total Difficulty sitting down on and standing up from a chair with arms (e.g., wheelchair, bedside commode, etc,.)?: Total Help needed moving to and from a bed to chair (including a wheelchair)?: Total Help needed walking in hospital room?: Total Help needed climbing 3-5 steps with a railing? : Total 6 Click Score: 6    End of Session Equipment Utilized During Treatment: Oxygen Activity Tolerance: Patient limited by fatigue;Treatment limited secondary to medical complications (Comment) (desaturation with minimal activity) Patient left: in bed;with call bell/phone within  reach;with bed alarm set;with family/visitor present Nurse Communication: Mobility status PT Visit Diagnosis: Difficulty in walking, not elsewhere classified (R26.2);Muscle weakness (generalized) (M62.81)    Time: 4332-9518 PT Time Calculation (min) (ACUTE ONLY): 23 min   Charges:   PT Evaluation $PT Eval Moderate Complexity: 1 Procedure     PT G Codes:   PT G-Codes **NOT FOR INPATIENT CLASS** Functional Assessment Tool Used: AM-PAC 6 Clicks Basic Mobility Functional Limitation: Mobility: Walking and moving around Mobility: Walking and Moving Around Current Status ([id number]): At least 80 percent but less than 100 percent impaired, limited or restricted Mobility: Walking and Moving Around Goal Status [phone]): At least 40 percent but less than 60 percent impaired, limited or restricted    Harlin Mazzoni H. Owens Shark, PT, DPT, NCS 08/21/16, 3:48 PM [phone]

## 2016-08-21 NOTE — Care Management (Signed)
Patient presents from home where he lives with wife, daughter, her husband and grandchildren.  Patient currently is followed by outpatient palliative.  Requested palliative consult to follow while inpatient. Notified Santiago Glad R with palliative of admission.   Patient appears very short of breath with conversation.  Says he use to have lung cancer but "it is gone. Now I have all this pneumonia."  Physical therapy has recommended SNF.  Patient does not commit one way or the other to consider this option.  Has had home health [facility] in the past (agency no longer in operation) and has chronic home oxygen through [facility]. At present oxygen requirements are above his baseline and has required nonrebreather

## 2016-08-21 NOTE — Progress Notes (Signed)
[place] at [place] NAME: Russell Howard    MR#:  [id number]  DATE OF BIRTH:  1934/09/07  SUBJECTIVE:  He says he is feeling better.  CHIEF COMPLAINT:   Chief Complaint  Patient presents with  . Respiratory Distress    REVIEW OF SYSTEMS:   ROS CONSTITUTIONAL:  EYES: No blurred or double vision.  EARS, NOSE, AND THROAT: No tinnitus or ear pain.  RESPIRATORY:SOBbetter today.   CARDIOVASCULAR: No chest pain, orthopnea, edema.  GASTROINTESTINAL: No nausea, vomiting, diarrhea or abdominal pain.  GENITOURINARY: No dysuria, hematuria.  ENDOCRINE: No polyuria, nocturia,  HEMATOLOGY: No anemia, easy bruising or bleeding SKIN: No rash or lesion. MUSCULOSKELETAL: No joint pain or arthritis.   NEUROLOGIC: No tingling, numbness, weakness.  PSYCHIATRY: No anxiety or depression.   DRUG ALLERGIES:   Allergies  Allergen Reactions  . Penicillin G Hives    Has patient had a PCN reaction causing immediate rash, facial/tongue/throat swelling, SOB or lightheadedness with hypotension: Yes Has patient had a PCN reaction causing severe rash involving mucus membranes or skin necrosis: No Has patient had a PCN reaction that required hospitalization No Has patient had a PCN reaction occurring within the last 10 years: No If all of the above answers are "NO", then may proceed with Cephalosporin use.  . Sulfa Antibiotics Rash    VITALS:  Blood pressure (!) 123/46, pulse 99, temperature 97.9 F (36.6 C), temperature source Oral, resp. rate 20, height 5\' 5"  (1.651 m), weight 84.2 kg (185 lb 9.6 oz), SpO2 90 %.  PHYSICAL EXAMINATION:  GENERAL:  82 y.o.-year-old patient lying in the bed ,breathing more comfortably today  EYES: Pupils equal, round, reactive to light and accommodation. No scleral icterus. Extraocular muscles intact.  HEENT: Head atraumatic, normocephalic. Oropharynx and nasopharynx clear.  NECK:  Supple, no jugular venous distention.  No thyroid enlargement, no tenderness.  LUNGS:  Mostly clear to auscultation. Faint wheeze on right side, CARDIOVASCULAR: S1, S2 normal. No murmurs, rubs, or gallops.  ABDOMEN: Soft, nontender, nondistended. Bowel sounds present. No organomegaly or mass.  EXTREMITIES: No pedal edema, cyanosis, or clubbing.  NEUROLOGIC: Cranial nerves II through XII are intact. Muscle strength 5/5 in all extremities. Sensation intact. Gait not checked.  PSYCHIATRIC: The patient is alert and oriented x 3.  SKIN: No obvious rash, lesion, or ulcer.    LABORATORY PANEL:   CBC  Recent Labs Lab 08/20/16 1041  WBC 18.2*  HGB 12.1*  HCT 36.6*  PLT 164   ------------------------------------------------------------------------------------------------------------------  Chemistries   Recent Labs Lab 08/19/16 0553  08/20/16 0135  NA 139  --  137  K 4.2  --  4.6  CL 101  --  106  CO2 23  --  24  GLUCOSE 179*  --  189*  BUN 19  --  24*  CREATININE 1.33*  --  1.27*  CALCIUM 9.3  --  8.7*  MG  --   < > 2.4  AST 43*  --   --   ALT 16*  --   --   ALKPHOS 72  --   --   BILITOT 1.0  --   --   < > = values in this interval not displayed. ------------------------------------------------------------------------------------------------------------------  Cardiac Enzymes  Recent Labs Lab 08/19/16 0553  TROPONINI <0.03   ------------------------------------------------------------------------------------------------------------------  RADIOLOGY:  Dg Chest 1 View  Result Date: 08/20/2016 CLINICAL DATA:  Status post right thoracentesis. EXAM: CHEST 1 VIEW COMPARISON:  08/19/2016 FINDINGS: Minimally  improved aeration of the right lung with residual significant underlying airspace disease present as well as component of right pleural fluid. There remains a central left pneumonia which appears slightly less dense. No pneumothorax. IMPRESSION: Minimally improved aeration of the right lung. Residual  significant airspace disease of the right lung with component of right pleural fluid. No pneumothorax after thoracentesis. Pneumonia in the central left lung may be slightly more dense. Electronically Signed   By: Aletta Edouard M.D.   On: 08/20/2016 16:35   Dg Chest Port 1 View  Result Date: 08/20/2016 CLINICAL DATA:  Initial evaluation for acute cough. EXAM: PORTABLE CHEST 1 VIEW COMPARISON:  Prior radiograph from earlier the same day. FINDINGS: Left-sided pacemaker/ AICD noted. Grossly stable cardiac and mediastinal silhouettes. Diffuse interstitial coarsening consistent with known pulmonary fibrosis. Worsening parenchymal opacity within the left mid lung. Diffuse opacity throughout the right lung also worsen, with decreased aeration. Possible small pleural effusions. Underlying increased pulmonary interstitial prominence as compared to previous. No pneumothorax. Osseous structures unchanged. IMPRESSION: 1. Interval worsening and widespread right lung and mid left lung opacities, with increased interstitial prominence as compared to prior. Findings may reflect increased pulmonary interstitial edema superimposed on background chronic interstitial lung changes. Worsened infection/pneumonia could also be considered. 2. Suspected small bilateral pleural effusions. Electronically Signed   By: Jeannine Boga M.D.   On: 08/20/2016 00:15   US Thoracentesis Asp Pleural Space W/img Guide  Result Date: 08/20/2016 CLINICAL DATA:  Bilateral pneumonia, respiratory distress, hypoxia and right pleural effusion. EXAM: ULTRASOUND GUIDED RIGHT THORACENTESIS COMPARISON:  None. PROCEDURE: An ultrasound guided thoracentesis was thoroughly discussed with the patient and questions answered. The benefits, risks, alternatives and complications were also discussed. The patient understands and wishes to proceed with the procedure. Written consent was obtained. A time-out was performed. Ultrasound was performed to localize and  mark an adequate pocket of fluid in the right chest. The area was then prepped and draped in the normal sterile fashion. 1% Lidocaine was used for local anesthesia. Under ultrasound guidance a 6 French Safe-T-Centesis catheter was introduced. Thoracentesis was performed. The catheter was removed and a dressing applied. COMPLICATIONS: None FINDINGS: A total of approximately 400 mL of cloudy, purulent fluid was removed. Fluid removal had to be stopped due to pain. A fluid sample was sent for laboratory analysis. IMPRESSION: Successful ultrasound guided right thoracentesis yielding 400 mL of purulent, infected appearing pleural fluid. The patient could not tolerate further removal due to pain. Electronically Signed   By: Aletta Edouard M.D.   On: 08/20/2016 16:36    EKG:   Orders placed or performed during the hospital encounter of 08/19/16  . ED EKG 12-Lead  . ED EKG 12-Lead  . EKG 12-Lead  . EKG 12-Lead  . EKG    ASSESSMENT AND PLAN:   Acute respiratory failure secondary to bilateral pneumonia, acute on chronic systolic heart failure. Continue oxygen to keep sats around 89-90%,  It is post thoracocentesis, about 400 mL of purulent fluid drained from the right lung. Follow cultures, continue vancomycin, Zosyn, oxygen, bronchodilators. Patient clinically is doing much better than yesterday. However IV steroids are the causing him high blood sugars for which he is needing insulin but patient refusing to take insulin,   . . explained to the patient and daughter also multiple times about hypoglycemia fracture on the body and he finally agreed to take insulin shots while he is in the hospital,   Nonischemic cardiomyopathy, chronic systolic heart failure with unknown baseline  check last 2-3. History of AICD placement history of chronic atrial fibrillation, history of metastatic lung cancer status post treatment. Previous echo showed EF 45%. Patient says Dr. Ubaldo Glassing from [facility] cardiology .   #3 COPD  exacerbation: Continue oxygen, IV steroids, bronchodilators.  4,bilateral pleural effusions, history of lung cancer. Patient [place] treatment out of state, no medical records in care everywhere.   #5 diabetes mellitus type 2. Hyperglycemia;refusing insulin,BG is around 400.explaiend to patient about high glucose can be due to steroids.he agreed to take insulin temporarily,not taking metformin for a long time has patient was told that he does not need medicines for his diabetes. Check hemoglobin A1c.  Discussed with the daughter and also the patient.  Prognosis is poor  All the records are reviewed and case discussed with Care Management/Social Workerr. Management plans discussed with the patient, family and they are in agreement.  CODE STATUS: DNR  TOTAL TIME TAKING CARE OF THIS PATIENT: 35 minutes.   POSSIBLE D/C IN 2-3 DAYS, DEPENDING ON CLINICAL CONDITION.   Epifanio Lesches M.D on 08/21/2016 at 1:02 PM  Between 7am to 6pm - Pager - [phone]  After 6pm go to [email or web address] - password [email or web address] [place] [facility]  Office  [phone]  CC: Primary care physician; [name], MD   Note: This dictation was prepared with Dragon dictation along with smaller phrase technology. Any transcriptional errors that result from this process are unintentional.

## 2016-08-21 NOTE — Progress Notes (Signed)
Pharmacy Antibiotic Note  Russell Howard is a 82 y.o. male admitted on 08/19/2016 with pneumonia.  Pharmacy has been consulted for vancomycin/cefepime dosing. Thoracentesis 7/18; >400 cc of purulent pleural fluid removed  Vancomycin/Cefepime Day 3  Plan: Current orders for vancomycin 1500mg  IV Q24H. Will adjust to vancomycin 1250mg  IV Q24H to achieve target trough of 15-28mcg/ml. Trough 7/21, approaching Css  Cefepime 2gm IV Q24H, will change to 2gm Q12H (as renal dose adjustment of 2g Q8H) due to severity of illness.   Height: 5\' 5"  (165.1 cm) Weight: 185 lb 9.6 oz (84.2 kg) IBW/kg (Calculated) : 61.5  Temp (24hrs), Avg:98.3 F (36.8 C), Min:97.8 F (36.6 C), Max:98.8 F (37.1 C)   Recent Labs Lab 08/19/16 0553 08/19/16 0857 08/20/16 0135 08/20/16 1041  WBC 21.2*  --   --  18.2*  CREATININE 1.33*  --  1.27*  --   LATICACIDVEN 6.9* 3.2* 2.5*  --     Estimated Creatinine Clearance: 47.1 mL/min (A) (by C-G formula based on SCr of 1.27 mg/dL (H)).    Allergies  Allergen Reactions  . Penicillin G Hives    Has patient had a PCN reaction causing immediate rash, facial/tongue/throat swelling, SOB or lightheadedness with hypotension: Yes Has patient had a PCN reaction causing severe rash involving mucus membranes or skin necrosis: No Has patient had a PCN reaction that required hospitalization No Has patient had a PCN reaction occurring within the last 10 years: No If all of the above answers are "NO", then may proceed with Cephalosporin use.  . Sulfa Antibiotics Rash   7/18 MRSA PCR negative 7/18 pleural fluid - NGTD 7/17 Blood x 2 - NGTD 7/17 urine - sent   Thank you for allowing pharmacy to be a part of this patient's care.  Rexene Edison, PharmD, BCPS Clinical Pharmacist  08/21/2016

## 2016-08-21 NOTE — Progress Notes (Signed)
Please note patient is a currently receiving Home Palliative services. CMRN Joni Reining aware. Thank you. Flo Shanks RN, BSN, [facility] [place], hospital Liaison [phone] c

## 2016-08-21 NOTE — Progress Notes (Signed)
4 Liters of oxygen via nasal canula,participated in physical therapy,said he feels better today than yesterday.

## 2016-08-22 ENCOUNTER — Inpatient Hospital Stay: Payer: Medicare Other

## 2016-08-22 LAB — CREATININE, SERUM
Creatinine, Ser: 1.04 mg/dL (ref 0.61–1.24)
GFR calc Af Amer: >60 mL/min (ref >60)

## 2016-08-22 LAB — GLUCOSE, CAPILLARY
GLUCOSE-CAPILLARY: 263 mg/dL — AB (ref 65–99)
GLUCOSE-CAPILLARY: 294 mg/dL — AB (ref 65–99)
Glucose-Capillary: 287 mg/dL — ABNORMAL HIGH (ref 65–99)
Glucose-Capillary: 303 mg/dL — ABNORMAL HIGH (ref 65–99)

## 2016-08-22 MED ORDER — INSULIN GLARGINE 100 UNIT/ML ~~LOC~~ SOLN
12.0000 [IU] | Freq: Every day | SUBCUTANEOUS | Status: DC
  Administered 2016-08-23 – 2016-08-25 (×3): 12 [IU] via SUBCUTANEOUS
  Filled 2016-08-22 (×3): qty 0.12

## 2016-08-22 MED ORDER — METHYLPREDNISOLONE SODIUM SUCC 40 MG IJ SOLR
40.0000 mg | Freq: Every day | INTRAMUSCULAR | Status: DC
  Administered 2016-08-23: 40 mg via INTRAVENOUS
  Filled 2016-08-22: qty 1

## 2016-08-22 NOTE — Care Management (Signed)
Patient and his son who is at bedside, verbalize that they wish for patient to discharge home and not consider skilled nursing.  No agency preference and [facility] has heads up referral

## 2016-08-22 NOTE — Progress Notes (Signed)
[place] at [place] NAME: Russell Howard    MR#:  [id number]  DATE OF BIRTH:  1934-08-03  SUBJECTIVE:  He says he is feeling better.less sob.told him  His hba1c is 7.4.  CHIEF COMPLAINT:   Chief Complaint  Patient presents with  . Respiratory Distress    REVIEW OF SYSTEMS:   Review of Systems  Respiratory: Positive for cough. Negative for shortness of breath.    CONSTITUTIONAL:  EYES: No blurred or double vision.  EARS, NOSE, AND THROAT: No tinnitus or ear pain.  RESPIRATORY:SOBbetter today.  But has mild cough. CARDIOVASCULAR: No chest pain, orthopnea, edema.  GASTROINTESTINAL: No nausea, vomiting, diarrhea or abdominal pain.  GENITOURINARY: No dysuria, hematuria.  ENDOCRINE: No polyuria, nocturia,  HEMATOLOGY: No anemia, easy bruising or bleeding SKIN: No rash or lesion. MUSCULOSKELETAL: No joint pain or arthritis.   NEUROLOGIC: No tingling, numbness, weakness.  PSYCHIATRY: No anxiety or depression.   DRUG ALLERGIES:   Allergies  Allergen Reactions  . Penicillin G Hives    Has patient had a PCN reaction causing immediate rash, facial/tongue/throat swelling, SOB or lightheadedness with hypotension: Yes Has patient had a PCN reaction causing severe rash involving mucus membranes or skin necrosis: No Has patient had a PCN reaction that required hospitalization No Has patient had a PCN reaction occurring within the last 10 years: No If all of the above answers are "NO", then may proceed with Cephalosporin use.  . Sulfa Antibiotics Rash    VITALS:  Blood pressure (!) 143/76, pulse (!) 103, temperature 97.7 F (36.5 C), temperature source Oral, resp. rate (!) 22, height 5\' 5"  (1.651 m), weight 84.2 kg (185 lb 9.6 oz), SpO2 96 %.  PHYSICAL EXAMINATION:  GENERAL:  82 y.o.-year-old patient lying in the bed ,breathing more comfortably today  EYES: Pupils equal, round, reactive to light and accommodation. No scleral icterus.  Extraocular muscles intact.  HEENT: Head atraumatic, normocephalic. Oropharynx and nasopharynx clear.  NECK:  Supple, no jugular venous distention. No thyroid enlargement, no tenderness.  LUNGS:  Mostly clear to auscultation. CARDIOVASCULAR: S1, S2 normal. No murmurs, rubs, or gallops.  ABDOMEN: Soft, nontender, nondistended. Bowel sounds present. No organomegaly or mass.  EXTREMITIES: No pedal edema, cyanosis, or clubbing.  NEUROLOGIC: Cranial nerves II through XII are intact. Muscle strength 5/5 in all extremities. Sensation intact. Gait not checked.  PSYCHIATRIC: The patient is alert and oriented x 3.  SKIN: No obvious rash, lesion, or ulcer.    LABORATORY PANEL:   CBC  Recent Labs Lab 08/20/16 1041  WBC 18.2*  HGB 12.1*  HCT 36.6*  PLT 164   ------------------------------------------------------------------------------------------------------------------  Chemistries   Recent Labs Lab 08/19/16 0553  08/20/16 0135 08/22/16 0537  NA 139  --  137  --   K 4.2  --  4.6  --   CL 101  --  106  --   CO2 23  --  24  --   GLUCOSE 179*  --  189*  --   BUN 19  --  24*  --   CREATININE 1.33*  --  1.27* 1.04  CALCIUM 9.3  --  8.7*  --   MG  --   < > 2.4  --   AST 43*  --   --   --   ALT 16*  --   --   --   ALKPHOS 72  --   --   --   BILITOT 1.0  --   --   --   < > =  values in this interval not displayed. ------------------------------------------------------------------------------------------------------------------  Cardiac Enzymes  Recent Labs Lab 08/19/16 0553  TROPONINI <0.03   ------------------------------------------------------------------------------------------------------------------  RADIOLOGY:  Dg Chest Port 1 View  Result Date: 08/22/2016 CLINICAL DATA:  Shortness of breath. EXAM: PORTABLE CHEST 1 VIEW COMPARISON:  08/20/2016.  03/02/2016.  09/07/2015.  CT 11/10/2015 FINDINGS: AICD in stable position. Prior cardiac valve replacement. Stable  cardiomegaly. Persistent changes of chronic interstitial lung disease with bronchiectasis and pleural-parenchymal scarring with volume loss right lung. Mild increase interstitial markings noted in the left lung suggesting an active process including pneumonitis or interstitial edema. No prominent pleural effusion. No pneumothorax. IMPRESSION: 1. AICD in stable position. Prior cardiac valve replacement. Stable cardiomegaly. 2. Persistent changes of chronic interstitial lung disease with bronchiectasis and pleuroparenchymal scarring with volume loss noted on the right. Mild increase interstitial markings noted in the left lung suggesting an active process including pneumonitis or interstitial edema. Electronically Signed   By: Marcello Moores  Register   On: 08/22/2016 06:57    EKG:   Orders placed or performed during the hospital encounter of 08/19/16  . ED EKG 12-Lead  . ED EKG 12-Lead  . EKG 12-Lead  . EKG 12-Lead  . EKG    ASSESSMENT AND PLAN:   Acute respiratory failure secondary to bilateral pneumonia, acute on chronic systolic heart failure.skowly improving Continue oxygen to keep sats around 89-90%,  s/ppost thoracocentesis, about 400 mL of purulent fluid drained from the right lung. Follow cultures, continue vancomycin, Zosyn, oxygen, bronchodilators. Wean down steroids.. .   Nonischemic cardiomyopathy, chronic systolic heart failure with unknown baseline check last 2-3. History of AICD placement history of chronic atrial fibrillation, history of metastatic lung cancer status post treatment. Previous echo showed EF 45%. Patient says Dr. Ubaldo Glassing from [facility] cardiology .   #3 COPD exacerbation: improving;Continue oxygen, IV steroids, bronchodilators .  4,bilateral pleural effusions, history of lung cancer. Patient had treatment out of state, no medical records in care everywhere.s/p thoracocentesis 2 days ago;now fluid cultures are negative.seen by DR.simmonds recommends Levaquin for 14 days   #5.  diabetes mellitus type 2. Hb a1 c 7.4.told patient that he need to restart oral meds once he talks to his PCP.for now start lantus and SSI  Discussed with the daughter and also the patient.  Prognosis is poor  All the records are reviewed and case discussed with Care Management/Social Workerr. Management plans discussed with the patient, family and they are in agreement.  CODE STATUS: DNR  TOTAL TIME TAKING CARE OF THIS PATIENT: 35 minutes.   POSSIBLE D/C IN 2-3 DAYS, DEPENDING ON CLINICAL CONDITION.   Epifanio Lesches M.D on 08/22/2016 at 4:17 PM  Between 7am to 6pm - Pager - [phone]  After 6pm go to [email or web address] - password [email or web address] [place] [facility]  Office  [phone]  CC: Primary care physician; [name], MD   Note: This dictation was prepared with Dragon dictation along with smaller phrase technology. Any transcriptional errors that result from this process are unintentional.

## 2016-08-22 NOTE — Progress Notes (Signed)
Inpatient Diabetes Program Recommendations  AACE/ADA: New Consensus Statement on Inpatient Glycemic Control (2015)  Target Ranges:  Prepandial:   less than 140 mg/dL      Peak postprandial:   less than 180 mg/dL (1-2 hours)      Critically ill patients:  140 - 180 mg/dL   Results for Russell Howard, Russell Howard (MRN [id number]) as of 08/22/2016 12:32  Ref. Range 08/19/2016 14:00  Hemoglobin A1C Latest Ref Range: 4.8 - 5.6 % 7.4 (H)   Review of Glycemic Control  Diabetes history: DM2 Outpatient Diabetes medications: None Current orders for Inpatient glycemic control: Lantus 10 units daily, Novolog 0-15 units TID with meals, Novolog 0-5 units QHS  Inpatient Diabetes Program Recommendations:  Insulin - Basal: Please consider increasing Lantus to 13 units daily. Insulin - Meal Coverage: While inpatient and ordered steroids, please consider ordering Novolog 4 units TID with meals for meal coverage if patient eats at least 50% of meals. HgbA1C: A1C 7.4% on 08/19/16. Patient would benefit from restarting oral DM medications as an outpatient and follow up with PCP regarding DM control. Diet: Please change diet to Carb Modified.  Thanks, Barnie Alderman, RN, MSN, CDE Diabetes Coordinator Inpatient Diabetes Program [phone] (Team Pager from 8am to 5pm)

## 2016-08-22 NOTE — Clinical Social Work Note (Signed)
CSW met with patient to discuss SNF placement, patient is choosing not to go to SNF.  Patient has caregivers around the clock, per patient and his family.  Patient would like to discharge home with home health and palliative to continue following him at home.  CSW informed patient and his family that if patient returns home and he ends up needing SNF a Education officer, museum from home health can assist with placing patient from home.  CSW updated case manager, this CSW to sign off, please reconsult if other social work needs arise.  Jones Broom. [place], MSW, [place]  08/22/2016 4:06 PM

## 2016-08-22 NOTE — Care Management Important Message (Signed)
Important Message  Patient Details  Name: Russell Howard MRN: [id number] Date of Birth: 05/21/1934   Medicare Important Message Given:  Yes Signed IM notice given   Katrina Stack, RN 08/22/2016, 2:57 PM

## 2016-08-22 NOTE — Progress Notes (Signed)
Pharmacy Antibiotic Note  Russell Howard is a 82 y.o. male admitted on 08/19/2016 with pneumonia.  Pharmacy has been consulted for cefepime dosing.  Cefepime Day 3  Plan: Per pulmonology, cloudy pleural fluid thought to be chronic and not suggestive empyema. Vancomycin discontinued. Recommend continuing cefepime while hospitalized then transitioning to Levaquin to complete 14 day course (through 7/31)  Continue 2gm Q12H (as renal dose adjustment of 2g Q8H)  Height: 5\' 5"  (165.1 cm) Weight: 185 lb 9.6 oz (84.2 kg) IBW/kg (Calculated) : 61.5  Temp (24hrs), Avg:97.8 F (36.6 C), Min:97.6 F (36.4 C), Max:98 F (36.7 C)   Recent Labs Lab 08/19/16 0553 08/19/16 0857 08/20/16 0135 08/20/16 1041 08/22/16 0537  WBC 21.2*  --   --  18.2*  --   CREATININE 1.33*  --  1.27*  --  1.04  LATICACIDVEN 6.9* 3.2* 2.5*  --   --     Estimated Creatinine Clearance: 57.5 mL/min (by C-G formula based on SCr of 1.04 mg/dL).    Allergies  Allergen Reactions  . Penicillin G Hives    Has patient had a PCN reaction causing immediate rash, facial/tongue/throat swelling, SOB or lightheadedness with hypotension: Yes Has patient had a PCN reaction causing severe rash involving mucus membranes or skin necrosis: No Has patient had a PCN reaction that required hospitalization No Has patient had a PCN reaction occurring within the last 10 years: No If all of the above answers are "NO", then may proceed with Cephalosporin use.  . Sulfa Antibiotics Rash   7/18 MRSA PCR negative 7/18 pleural fluid - NGTD 7/17 Blood x 2 - NGTD 7/17 urine - sent   Thank you for allowing pharmacy to be a part of this patient's care.  Rexene Edison, PharmD, BCPS Clinical Pharmacist  08/22/2016

## 2016-08-23 LAB — BASIC METABOLIC PANEL
Anion gap: 6 (ref 5–15)
BUN: 38 mg/dL — ABNORMAL HIGH (ref 6–20)
CHLORIDE: 100 mmol/L — AB (ref 101–111)
CO2: 26 mmol/L (ref 22–32)
Calcium: 9.2 mg/dL (ref 8.9–10.3)
Creatinine, Ser: 1.06 mg/dL (ref 0.61–1.24)
GFR calc non Af Amer: >60 mL/min (ref >60)
Glucose, Bld: 227 mg/dL — ABNORMAL HIGH (ref 65–99)
Potassium: 5.1 mmol/L (ref 3.5–5.1)
SODIUM: 132 mmol/L — AB (ref 135–145)

## 2016-08-23 LAB — CBC
HCT: 38.5 % — ABNORMAL LOW (ref 40.0–52.0)
Hemoglobin: 12.6 g/dL — ABNORMAL LOW (ref 13.0–18.0)
MCH: 29.3 pg (ref 26.0–34.0)
MCHC: 32.9 g/dL (ref 32.0–36.0)
MCV: 89.1 fL (ref 80.0–100.0)
Platelets: 201 K/uL (ref 150–440)
RBC: 4.32 MIL/uL — ABNORMAL LOW (ref 4.40–5.90)
RDW: 16.1 % — ABNORMAL HIGH (ref 11.5–14.5)
WBC: 18 K/uL — ABNORMAL HIGH (ref 3.8–10.6)

## 2016-08-23 LAB — GLUCOSE, CAPILLARY
Glucose-Capillary: 151 mg/dL — ABNORMAL HIGH (ref 65–99)
Glucose-Capillary: 176 mg/dL — ABNORMAL HIGH (ref 65–99)
Glucose-Capillary: 260 mg/dL — ABNORMAL HIGH (ref 65–99)
Glucose-Capillary: 324 mg/dL — ABNORMAL HIGH (ref 65–99)

## 2016-08-23 LAB — PROCALCITONIN: Procalcitonin: 2.52 ng/mL

## 2016-08-23 MED ORDER — GABAPENTIN 600 MG PO TABS
300.0000 mg | ORAL_TABLET | Freq: Two times a day (BID) | ORAL | Status: DC
  Administered 2016-08-23 – 2016-08-30 (×12): 300 mg via ORAL
  Filled 2016-08-23 (×13): qty 1

## 2016-08-23 MED ORDER — PREDNISONE 10 MG PO TABS
10.0000 mg | ORAL_TABLET | Freq: Every day | ORAL | Status: DC
  Administered 2016-08-24 – 2016-08-25 (×2): 10 mg via ORAL
  Filled 2016-08-23 (×2): qty 1

## 2016-08-23 NOTE — Plan of Care (Signed)
Problem: Nutrition: Goal: Adequate nutrition will be maintained Outcome: Progressing Pts oral intake improved this shift. Pt encouraged to eat to maintain energy and promote healing.

## 2016-08-23 NOTE — Progress Notes (Signed)
[facility] [place] NAME: Russell Howard    MR#:  [id number]  DATE OF BIRTH:  08-22-1934  SUBJECTIVE: More sleepy today. Patient denies shortness of breath,  Or  abdominal pain.   CHIEF COMPLAINT:   Chief Complaint  Patient presents with  . Respiratory Distress    REVIEW OF SYSTEMS:   Review of Systems  Respiratory: Positive for cough. Negative for shortness of breath.    CONSTITUTIONAL:  EYES: No blurred or double vision.  EARS, NOSE, AND THROAT: No tinnitus or ear pain.  RESPIRATORY:SOBbetter today.  But has mild cough. CARDIOVASCULAR: No chest pain, orthopnea, edema.  GASTROINTESTINAL: No nausea, vomiting, diarrhea or abdominal pain.  GENITOURINARY: No dysuria, hematuria.  ENDOCRINE: No polyuria, nocturia,  HEMATOLOGY: No anemia, easy bruising or bleeding SKIN: No rash or lesion. MUSCULOSKELETAL: No joint pain or arthritis.   NEUROLOGIC: No tingling, numbness, weakness.  PSYCHIATRY: No anxiety or depression.   DRUG ALLERGIES:   Allergies  Allergen Reactions  . Penicillin G Hives    Has patient had a PCN reaction causing immediate rash, facial/tongue/throat swelling, SOB or lightheadedness with hypotension: Yes Has patient had a PCN reaction causing severe rash involving mucus membranes or skin necrosis: No Has patient had a PCN reaction that required hospitalization No Has patient had a PCN reaction occurring within the last 10 years: No If all of the above answers are "NO", then may proceed with Cephalosporin use.  . Sulfa Antibiotics Rash    VITALS:  Blood pressure 131/79, pulse 99, temperature 97.6 F (36.4 C), temperature source Oral, resp. rate 20, height 5\' 5"  (1.651 m), weight 83.8 kg (184 lb 11.2 oz), SpO2 95 %.  PHYSICAL EXAMINATION:  GENERAL:  82 y.o.-year-old patient lying in the bed ,breathing more comfortably today  EYES: Pupils equal, round, reactive to light and accommodation. No scleral icterus.  Extraocular muscles intact.  HEENT: Head atraumatic, normocephalic. Oropharynx and nasopharynx clear.  NECK:  Supple, no jugular venous distention. No thyroid enlargement, no tenderness.  LUNGS:  Mostly clear to auscultation. CARDIOVASCULAR: S1, S2 normal. No murmurs, rubs, or gallops.  ABDOMEN: Soft, nontender, nondistended. Bowel sounds present. No organomegaly or mass.  EXTREMITIES: No pedal edema, cyanosis, or clubbing.  NEUROLOGIC: Cranial nerves II through XII are intact. Muscle strength 5/5 in all extremities. Sensation intact. Gait not checked.  PSYCHIATRIC: The patient is alert and oriented x 3.  SKIN: No obvious rash, lesion, or ulcer.    LABORATORY PANEL:   CBC  Recent Labs Lab 08/20/16 1041  WBC 18.2*  HGB 12.1*  HCT 36.6*  PLT 164   ------------------------------------------------------------------------------------------------------------------  Chemistries   Recent Labs Lab 08/19/16 0553  08/20/16 0135 08/22/16 0537  NA 139  --  137  --   K 4.2  --  4.6  --   CL 101  --  106  --   CO2 23  --  24  --   GLUCOSE 179*  --  189*  --   BUN 19  --  24*  --   CREATININE 1.33*  --  1.27* 1.04  CALCIUM 9.3  --  8.7*  --   MG  --   < > 2.4  --   AST 43*  --   --   --   ALT 16*  --   --   --   ALKPHOS 72  --   --   --   BILITOT 1.0  --   --   --   < > =  values in this interval not displayed. ------------------------------------------------------------------------------------------------------------------  Cardiac Enzymes  Recent Labs Lab 08/19/16 0553  TROPONINI <0.03   ------------------------------------------------------------------------------------------------------------------  RADIOLOGY:  Dg Chest Port 1 View  Result Date: 08/22/2016 CLINICAL DATA:  Shortness of breath. EXAM: PORTABLE CHEST 1 VIEW COMPARISON:  08/20/2016.  03/02/2016.  09/07/2015.  CT 11/10/2015 FINDINGS: AICD in stable position. Prior cardiac valve replacement. Stable  cardiomegaly. Persistent changes of chronic interstitial lung disease with bronchiectasis and pleural-parenchymal scarring with volume loss right lung. Mild increase interstitial markings noted in the left lung suggesting an active process including pneumonitis or interstitial edema. No prominent pleural effusion. No pneumothorax. IMPRESSION: 1. AICD in stable position. Prior cardiac valve replacement. Stable cardiomegaly. 2. Persistent changes of chronic interstitial lung disease with bronchiectasis and pleuroparenchymal scarring with volume loss noted on the right. Mild increase interstitial markings noted in the left lung suggesting an active process including pneumonitis or interstitial edema. Electronically Signed   By: Marcello Moores  Register   On: 08/22/2016 06:57    EKG:   Orders placed or performed during the hospital encounter of 08/19/16  . ED EKG 12-Lead  . ED EKG 12-Lead  . EKG 12-Lead  . EKG 12-Lead  . EKG    ASSESSMENT AND PLAN:   Acute respiratory failure secondary to bilateral pneumonia, acute on chronic systolic heart failure.skowly improving Continue oxygen to keep sats around 89-90%,  s/ppost thoracocentesis, about 400 mL of purulent fluid drained from the right lung. Follow cultures, continue vancomycin, Zosyn, oxygen, bronchodilators. Wean down steroids.Marland Kitchen Marland KitchenWe'll discharge either tomorrow or Monday. Patient is very sleepy today side decreased the Neurontin.  on 4 L of oxygen saturation 95%. He was on 6 L before.  Nonischemic cardiomyopathy, chronic systolic heart failure with unknown baseline check last 2-3. History of AICD placement history of chronic atrial fibrillation, history of metastatic lung cancer status post treatment. Previous echo showed EF 45%. Patient says Dr. Ubaldo Glassing from [facility]ology .   #3 COPD exacerbation: improving;Continue oxygen, IV steroids, bronchodilators .  4,bilateral pleural effusions, history of lung cancer. [place] of state, no  medical records in care everywhere.s/p thoracocentesis 2 days ago;now fluid cultures are negative.seen by DR.simmonds recommends Levaquin for 14 days   #5. diabetes mellitus type 2. Hb a1 c 7.4.told patient that he need to restart oral meds once he talks to his PCP.for now start lantus and SSI discussed with patient's daughter also.Marland Kitchen  Discussed with the daughter and also the patient.  Prognosis is poor  All the records are reviewed and case discussed with Care Management/Social Workerr. Management plans discussed with the patient, family and they are in agreement.  CODE STATUS: DNR  TOTAL TIME TAKING CARE OF THIS PATIENT: 35 minutes.   POSSIBLE D/C IN 2-3 DAYS, DEPENDING ON CLINICAL CONDITION.   Epifanio Lesches M.D on 08/23/2016 at 12:23 PM  Between 7am to 6pm - Pager - [phone]  After 6pm go to [email or web address] - password [email or web address] [facility]  Office  [phone]  CC: Primary care physician; [name], MD   Note: This dictation was prepared with Dragon dictation along with smaller phrase technology. Any transcriptional errors that result from this process are unintentional.

## 2016-08-24 LAB — BASIC METABOLIC PANEL
ANION GAP: 10 (ref 5–15)
BUN: 43 mg/dL — ABNORMAL HIGH (ref 6–20)
CALCIUM: 9.3 mg/dL (ref 8.9–10.3)
CO2: 24 mmol/L (ref 22–32)
Chloride: 98 mmol/L — ABNORMAL LOW (ref 101–111)
Creatinine, Ser: 1 mg/dL (ref 0.61–1.24)
Glucose, Bld: 271 mg/dL — ABNORMAL HIGH (ref 65–99)
Potassium: 4.8 mmol/L (ref 3.5–5.1)
SODIUM: 132 mmol/L — AB (ref 135–145)

## 2016-08-24 LAB — BODY FLUID CULTURE: Culture: NO GROWTH

## 2016-08-24 LAB — CULTURE, BLOOD (ROUTINE X 2)
CULTURE: NO GROWTH
Culture: NO GROWTH
Special Requests: ADEQUATE

## 2016-08-24 LAB — DIGOXIN LEVEL: Digoxin Level: 2 ng/mL (ref 0.8–2.0)

## 2016-08-24 LAB — GLUCOSE, CAPILLARY
GLUCOSE-CAPILLARY: 287 mg/dL — AB (ref 65–99)
GLUCOSE-CAPILLARY: 191 mg/dL — AB (ref 65–99)
Glucose-Capillary: 131 mg/dL — ABNORMAL HIGH (ref 65–99)
Glucose-Capillary: 215 mg/dL — ABNORMAL HIGH (ref 65–99)

## 2016-08-24 LAB — MAGNESIUM: MAGNESIUM: 1.9 mg/dL (ref 1.7–2.4)

## 2016-08-24 NOTE — Progress Notes (Signed)
[facility] [place] NAME: Gabriella Guile    MR#:  [id number]  DATE OF BIRTH:  [birth year]  SUBJECTIVE: Complaints of fatigue, does not feel like doing anything. Does not c/o any problem like worsening shortness of breath, chest pain, abdominal pain, nausea, vomiting. Suspect possible depression, daughter expressed  yesterday they want to continue IV antibiotics as much as possible and patient does not want to take oral antibiotics.  CHIEF COMPLAINT:   Chief Complaint  Patient presents with  . Respiratory Distress    REVIEW OF SYSTEMS:   Review of Systems  Respiratory: Negative for cough and shortness of breath.    CONSTITUTIONAL:  EYES: No blurred or double vision.  EARS, NOSE, AND THROAT: No tinnitus or ear pain.  RESPIRATORY:SOBbetter today.  But has mild cough. CARDIOVASCULAR: No chest pain, orthopnea, edema.  GASTROINTESTINAL: No nausea, vomiting, diarrhea or abdominal pain.  GENITOURINARY: No dysuria, hematuria.  ENDOCRINE: No polyuria, nocturia,  HEMATOLOGY: No anemia, easy bruising or bleeding SKIN: No rash or lesion. MUSCULOSKELETAL: No joint pain or arthritis.   NEUROLOGIC: No tingling, numbness, weakness.  PSYCHIATRY: No anxiety or depression.   DRUG ALLERGIES:   Allergies  Allergen Reactions  . Penicillin G Hives    Has patient had a PCN reaction causing immediate rash, facial/tongue/throat swelling, SOB or lightheadedness with hypotension: Yes Has patient had a PCN reaction causing severe rash involving mucus membranes or skin necrosis: No Has patient had a PCN reaction that required hospitalization No Has patient had a PCN reaction occurring within the last 10 years: No If all of the above answers are "NO", then may proceed with Cephalosporin use.  . Sulfa Antibiotics Rash    VITALS:  Blood pressure 107/63, pulse 100, temperature 97.6 F (36.4 C), temperature source Oral, resp. rate 18, height 5\' 5"  (1.651  m), weight 81.6 kg (179 lb 12.8 oz), SpO2 97 %.  PHYSICAL EXAMINATION:  GENERAL:  82 y.o.-year-old patient lying in the bed ,breathing more comfortably today  EYES: Pupils equal, round, reactive to light and accommodation. No scleral icterus. Extraocular muscles intact.  HEENT: Head atraumatic, normocephalic. Oropharynx and nasopharynx clear.  NECK:  Supple, no jugular venous distention. No thyroid enlargement, no tenderness.  LUNGS:  Mostly clear to auscultation. CARDIOVASCULAR: S1, S2 normal. No murmurs, rubs, or gallops.  ABDOMEN: Soft, nontender, nondistended. Bowel sounds present. No organomegaly or mass.  EXTREMITIES: No pedal edema, cyanosis, or clubbing.  NEUROLOGIC: Cranial nerves II through XII are intact. Muscle strength 5/5 in all extremities. Sensation intact. Gait not checked.  PSYCHIATRIC: The patient is alert and oriented x 3.  SKIN: No obvious rash, lesion, or ulcer.    LABORATORY PANEL:   CBC  Recent Labs Lab 08/23/16 1245  WBC 18.0*  HGB 12.6*  HCT 38.5*  PLT 201   ------------------------------------------------------------------------------------------------------------------  Chemistries   Recent Labs Lab 08/19/16 0553  08/20/16 0135  08/23/16 1245  NA 139  --  137  --  132*  K 4.2  --  4.6  --  5.1  CL 101  --  106  --  100*  CO2 23  --  24  --  26  GLUCOSE 179*  --  189*  --  227*  BUN 19  --  24*  --  38*  CREATININE 1.33*  --  1.27*  < > 1.06  CALCIUM 9.3  --  8.7*  --  9.2  MG  --   < >  2.4  --   --   AST 43*  --   --   --   --   ALT 16*  --   --   --   --   ALKPHOS 72  --   --   --   --   BILITOT 1.0  --   --   --   --   < > = values in this interval not displayed. ------------------------------------------------------------------------------------------------------------------  Cardiac Enzymes  Recent Labs Lab 08/19/16 0553  TROPONINI <0.03    ------------------------------------------------------------------------------------------------------------------  RADIOLOGY:  No results found.  EKG:   Orders placed or performed during the hospital encounter of 08/19/16  . ED EKG 12-Lead  . ED EKG 12-Lead  . EKG 12-Lead  . EKG 12-Lead  . EKG    ASSESSMENT AND PLAN:   Acute respiratory failure secondary to bilateral pneumonia, acute on chronic systolic heart failure.skowly improving Continue oxygen to keep sats around 89-90%, Patient is on 2 L of oxygen and saturation 96%. s/ppost thoracocentesis, about 400 mL of purulent fluid drained from the right lung. Follow cultures, continue vancomycin, Zosyn,  pro-calcitonin is coming down.oxygen, bronchodilators. Wean down steroids.. . Fatigue likely secondary to recent pneumonia with antibiotic use but check digoxin level, Chem-7 again. Poorly motivated to do physical therapy without telling the reason. Suspect possible depression, worried about going home with antibiotics as she he told me that it will upset his stomach. Discharge home tomorrow discussed this with patient.   chronic systolic heart failure with unknown baseline check last 2-3. History of AICD placement history of chronic atrial fibrillation, history of metastatic lung cancer status post treatment. Previous echo showed EF 45%. Patient says Dr. Ubaldo Glassing from [facility] cardiology hypokalemia, stop her potassium supplements. Patient not on Lasix at this time. Appears well compensated.  .   #3 COPD exacerbation: improving;Continue oxygen, IV steroids, bronchodilators .  4,bilateral pleural effusions, history of lung cancer. Patient had treatment out of state, no medical records in care everywhere.s/p thoracocentesis 2 days ago;now fluid cultures are negative.seen by DR.simmonds recommends Levaquin for 14 days   #5. diabetes mellitus type 2. Hb a1 c 7.4.told patient that he need to restart oral meds once he talks to his PCP.for now start  lantus and SSI discussed with patient's daughter also.Marland Kitchen  Discussed with the daughter and also the patient.  Prognosis is poor  All the records are reviewed and case discussed with Care Management/Social Workerr. Management plans discussed with the patient, family and they are in agreement.  CODE STATUS: DNR  TOTAL TIME TAKING CARE OF THIS PATIENT: 35 minutes.   POSSIBLE D/C IN 2-3 DAYS, DEPENDING ON CLINICAL CONDITION.   Epifanio Lesches M.D on 08/24/2016 at 11:57 AM  Between 7am to 6pm - Pager - [phone]  After 6pm go to [email or web address] - password [email or web address] [facility]  Office  [phone]  CC: Primary care physician; [name], MD   Note: This dictation was prepared with Dragon dictation along with smaller phrase technology. Any transcriptional errors that result from this process are unintentional.

## 2016-08-24 NOTE — Progress Notes (Signed)
Patient was weaned from 4L to 2 L/[place]  and is sating 97 %. No acute distress noted. Patient has been having productive cough and is tan in color. Patient is resting quietly with his eyes closed, respirations even and unlabored. Will continue to monitor.

## 2016-08-24 NOTE — Progress Notes (Signed)
PT Cancellation Note  Patient Details Name: Russell Howard MRN: [id number] DOB: 1934-03-16   Cancelled Treatment:    Reason Eval/Treat Not Completed: Patient declined, no reason specified   Pt attempted x 3 this am.  On first 2 attempts pt trying to eat breakfast but taking and extended period of time to eat.  Returned later in am and he declined stating he did not feel well and did not want to participate.  Encouraged mobility skills as he was wishing to return home and plan per chart was possibly today or tomorrow.  He continued to decline.  "I can't".   Chesley Noon 08/24/2016, 11:09 AM

## 2016-08-25 LAB — GLUCOSE, CAPILLARY
GLUCOSE-CAPILLARY: 141 mg/dL — AB (ref 65–99)
Glucose-Capillary: 204 mg/dL — ABNORMAL HIGH (ref 65–99)
Glucose-Capillary: 166 mg/dL — ABNORMAL HIGH (ref 65–99)
Glucose-Capillary: 160 mg/dL — ABNORMAL HIGH (ref 65–99)

## 2016-08-25 LAB — PROCALCITONIN: PROCALCITONIN: 0.79 ng/mL

## 2016-08-25 MED ORDER — DIGOXIN 125 MCG PO TABS
0.1250 mg | ORAL_TABLET | Freq: Every day | ORAL | Status: DC
  Administered 2016-08-26 – 2016-08-27 (×2): 0.125 mg via ORAL
  Filled 2016-08-25 (×2): qty 1

## 2016-08-25 MED ORDER — IPRATROPIUM-ALBUTEROL 0.5-2.5 (3) MG/3ML IN SOLN
3.0000 mL | Freq: Three times a day (TID) | RESPIRATORY_TRACT | Status: DC
  Administered 2016-08-25 – 2016-08-28 (×7): 3 mL via RESPIRATORY_TRACT
  Filled 2016-08-25 (×8): qty 3

## 2016-08-25 MED ORDER — SODIUM CHLORIDE 0.9 % IV SOLN
INTRAVENOUS | Status: DC
  Administered 2016-08-25: 13:00:00 via INTRAVENOUS

## 2016-08-25 MED ORDER — IPRATROPIUM-ALBUTEROL 0.5-2.5 (3) MG/3ML IN SOLN
3.0000 mL | Freq: Four times a day (QID) | RESPIRATORY_TRACT | Status: DC | PRN
  Filled 2016-08-25: qty 3

## 2016-08-25 MED ORDER — QUETIAPINE FUMARATE 25 MG PO TABS: 25.0000 mg | ORAL_TABLET | Freq: Every day | ORAL | Status: DC

## 2016-08-25 NOTE — Progress Notes (Addendum)
Physical Therapy Treatment Patient Details Name: Russell Howard MRN: [id number] DOB: December 14, 1934 Today's Date: Oct 01, 202018    History of Present Illness presented to ER secondary to SOB, requiring BiPAP upon admission; admitted with sepsis related to bilat PNA.  Status post L thoracentesis (7/18) with removal of 424mL fluid.    PT Comments    Pt agreeable to PT; reports general aches and pain, but notes B knees painful more so than elsewhere. Pt participates in supine exercises without assist, but requires rest breaks.and instruction for technique. Pt fatigues quickly. Offered up in bed/out of bed post exercise, but pt too fatigued post limited exercises. Encouraged participation in exercises several times a day for improved endurance and strength. Continue to progress and improve functional mobility.    Follow Up Recommendations  SNF     Equipment Recommendations       Recommendations for Other Services       Precautions / Restrictions Precautions Precautions: Fall;ICD/Pacemaker    Mobility  Bed Mobility               General bed mobility comments: Not tested; feels too weak   Transfers                    Ambulation/Gait                 Stairs            Wheelchair Mobility    Modified Rankin (Stroke Patients Only)       Balance                                            Cognition Arousal/Alertness: Awake/alert Behavior During Therapy: WFL for tasks assessed/performed Overall Cognitive Status: Within Functional Limits for tasks assessed                                        Exercises General Exercises - Lower Extremity Ankle Circles/Pumps: AROM;Both;10 reps;Supine Quad Sets: Strengthening;Both;10 reps;Supine Gluteal Sets: Strengthening;Both;10 reps;Supine Short Arc Quad: AROM;Both;10 reps;Supine Heel Slides: AROM;Both;10 reps;Supine Hip ABduction/ADduction: AROM;Both;10 reps;Supine Straight Leg  Raises: Strengthening;Both;10 reps;Supine    General Comments        Pertinent Vitals/Pain Pain Assessment: 0-10 Pain Location: general aches/B knees    Home Living                      Prior Function            PT Goals (current goals can now be found in the care plan section)      Frequency    Min 2X/week      PT Plan Current plan remains appropriate    Co-evaluation              AM-PAC PT "6 Clicks" Daily Activity  Outcome Measure  Difficulty turning over in bed (including adjusting bedclothes, sheets and blankets)?: Total Difficulty moving from lying on back to sitting on the side of the bed? : Total Difficulty sitting down on and standing up from a chair with arms (e.g., wheelchair, bedside commode, etc,.)?: Total Help needed moving to and from a bed to chair (including a wheelchair)?: Total Help needed walking in hospital room?: Total Help needed climbing 3-5 steps with a railing? : Total  6 Click Score: 6    End of Session Equipment Utilized During Treatment: Oxygen Activity Tolerance: Patient limited by fatigue;Patient limited by pain Patient left: in bed;with call bell/phone within reach;with bed alarm set   PT Visit Diagnosis: Difficulty in walking, not elsewhere classified (R26.2);Muscle weakness (generalized) (M62.81)     Time: 1615- 1633    Charges:  $Therapeutic Exercise: 8-22 mins                    G Codes:  Functional Assessment Tool Used: AM-PAC 6 Clicks Basic Mobility     Larae Grooms, PTA 03-05-202018, 4:26 PM

## 2016-08-25 NOTE — Progress Notes (Signed)
Pharmacy Antibiotic Note  Russell Howard is a 82 y.o. male admitted on 08/19/2016 with pneumonia.  Pharmacy has been consulted for cefepime dosing.  Cefepime Day 6  Plan: Per pulmonology, cloudy pleural fluid thought to be chronic and not suggestive empyema. Vancomycin discontinued. Recommend continuing cefepime while hospitalized then transitioning to Levaquin to complete 14 day course (through 7/31)  Continue Cefepime 2gm Q12H (as renal dose adjustment of 2g Q8H). Patient is borderline for dose adjustment.   Height: 5\' 5"  (165.1 cm) Weight: 180 lb 4.8 oz (81.8 kg) IBW/kg (Calculated) : 61.5  Temp (24hrs), Avg:97.6 F (36.4 C), Min:97.2 F (36.2 C), Max:98 F (36.7 C)   Recent Labs Lab 08/19/16 0553 08/19/16 0857 08/20/16 0135 08/20/16 1041 08/22/16 0537 08/23/16 1245 08/24/16 1217  WBC 21.2*  --   --  18.2*  --  18.0*  --   CREATININE 1.33*  --  1.27*  --  1.04 1.06 1.00  LATICACIDVEN 6.9* 3.2* 2.5*  --   --   --   --     Estimated Creatinine Clearance: 59 mL/min (by C-G formula based on SCr of 1 mg/dL).    Allergies  Allergen Reactions  . Penicillin G Hives    Has patient had a PCN reaction causing immediate rash, facial/tongue/throat swelling, SOB or lightheadedness with hypotension: Yes Has patient had a PCN reaction causing severe rash involving mucus membranes or skin necrosis: No Has patient had a PCN reaction that required hospitalization No Has patient had a PCN reaction occurring within the last 10 years: No If all of the above answers are "NO", then may proceed with Cephalosporin use.  . Sulfa Antibiotics Rash   7/18 MRSA PCR negative 7/18 pleural fluid - NGTD 7/17 Blood x 2 - NGTD 7/17 urine - sent   Thank you for allowing pharmacy to be a part of this patient's care.  Chinita Greenland PharmD Clinical Pharmacist [phone]

## 2016-08-25 NOTE — Care Management (Signed)
Patient oxygen has been weaned to 2 liters. Suggested to attending to have palliative follow while inpatient as they are following patient at home

## 2016-08-25 NOTE — Progress Notes (Addendum)
[facility] [place] NAME: Russell Howard    MR#:  [id number]  DATE OF BIRTH:  May 14, 1934  SUBJECTIVE: Complains of a lot of fatigue, not eating hypoxia. BP is low, tachycardic.   CHIEF COMPLAINT:   Chief Complaint  Patient presents with  . Respiratory Distress    REVIEW OF SYSTEMS:   Review of Systems  Respiratory: Negative for cough and shortness of breath.    CONSTITUTIONAL:  EYES: No blurred or double vision.  EARS, NOSE, AND THROAT: No tinnitus or ear pain.  RESPIRATORY:SOBbetter today.   CARDIOVASCULAR: No chest pain, orthopnea, edema.  GASTROINTESTINAL: No nausea, vomiting, diarrhea or abdominal pain.  GENITOURINARY: No dysuria, hematuria.  ENDOCRINE: No polyuria, nocturia,  HEMATOLOGY: No anemia, easy bruising or bleeding SKIN: No rash or lesion. MUSCULOSKELETAL: No joint pain or arthritis.   NEUROLOGIC: No tingling, numbness, weakness.  PSYCHIATRY: No anxiety or depression.   DRUG ALLERGIES:   Allergies  Allergen Reactions  . Penicillin G Hives    Has patient had a PCN reaction causing immediate rash, facial/tongue/throat swelling, SOB or lightheadedness with hypotension: Yes Has patient had a PCN reaction causing severe rash involving mucus membranes or skin necrosis: No Has patient had a PCN reaction that required hospitalization No Has patient had a PCN reaction occurring within the last 10 years: No If all of the above answers are "NO", then may proceed with Cephalosporin use.  . Sulfa Antibiotics Rash    VITALS:  Blood pressure 98/61, pulse (!) 117, temperature 98 F (36.7 C), resp. rate 19, height 5\' 5"  (1.651 m), weight 81.8 kg (180 lb 4.8 oz), SpO2 97 %.  PHYSICAL EXAMINATION:  GENERAL:  82 y.o.-year-old patient lying in the bed ,breathing more comfortably today  EYES: Pupils equal, round, reactive to light and accommodation. No scleral icterus. Extraocular muscles intact.  HEENT: Head atraumatic,  normocephalic. Oropharynx and nasopharynx clear.  NECK:  Supple, no jugular venous distention. No thyroid enlargement, no tenderness.  LUNGS:  Mostly clear to auscultation. CARDIOVASCULAR: S1, S2 normal. No murmurs, rubs, or gallops.  ABDOMEN: Soft, nontender, nondistended. Bowel sounds present. No organomegaly or mass.  EXTREMITIES: No pedal edema, cyanosis, or clubbing.  NEUROLOGIC: Cranial nerves II through XII are intact. Muscle strength 5/5 in all extremities. Sensation intact. Gait not checked.  PSYCHIATRIC: The patient is alert and oriented x 3.  SKIN: No obvious rash, lesion, or ulcer.    LABORATORY PANEL:   CBC  Recent Labs Lab 08/23/16 1245  WBC 18.0*  HGB 12.6*  HCT 38.5*  PLT 201   ------------------------------------------------------------------------------------------------------------------  Chemistries   Recent Labs Lab 08/19/16 0553  08/24/16 1217  NA 139  < > 132*  K 4.2  < > 4.8  CL 101  < > 98*  CO2 23  < > 24  GLUCOSE 179*  < > 271*  BUN 19  < > 43*  CREATININE 1.33*  < > 1.00  CALCIUM 9.3  < > 9.3  MG  --   < > 1.9  AST 43*  --   --   ALT 16*  --   --   ALKPHOS 72  --   --   BILITOT 1.0  --   --   < > = values in this interval not displayed. ------------------------------------------------------------------------------------------------------------------  Cardiac Enzymes  Recent Labs Lab 08/19/16 0553  TROPONINI <0.03   ------------------------------------------------------------------------------------------------------------------  RADIOLOGY:  No results found.  EKG:   Orders placed or performed  during the hospital encounter of 08/19/16  . ED EKG 12-Lead  . ED EKG 12-Lead  . EKG 12-Lead  . EKG 12-Lead  . EKG    ASSESSMENT AND PLAN:   Acute respiratory failure secondary to bilateral pneumonia, acute on chronic systolic heart failure.sowly improving Continue oxygen to keep sats around 89-90%, Patient is on 2 L of oxygen and  saturation 96%. s/ppost thoracocentesis, about 400 mL of purulent fluid drained from the right lung.cultures  from pleural fluid are negative. continue vancomycin, Zosyn,  pro-calcitonin is coming down.oxygen, bronchodilators. Wean down steroids.. . Fatigue likely secondary to recent pneumonia with antibiotic use    chronic systolic heart failure with unknown baseline check last 2-3. History of AICD placement history of chronic atrial fibrillation, history of metastatic lung cancer status post treatment. Previous echo showed EF 45%. Patient says Dr. Ubaldo Glassing from [facility] cardiology hypokalemia, stop her potassium supplements. Patient not on Lasix at this time. Appears well compensated.  .   #3 COPD exacerbation: improving;Continue oxygen, IV steroids, bronchodilators .  4,bilateral pleural effusions, history of lung cancer. [place] state, no medical records in care everywhere.s/p thoracocentesis 2 days ago;now fluid cultures are negative.seen by DR.simmonds recommends Levaquin for 14 days   #5. diabetes mellitus type 2. Hb a1 c 7.4poor mouth intake. Continue sliding scale insulin coverage, hold Lantus. Po intake poor.  Hypotension, tachycardia due to dehydration, patient appears dehydrated not eating and not drinking also. Is that he feels tired. Continue IV fluids.  Discussed with the daughter and also the patient.   Daughter had so many questions about  Seroquel and she does not want Seroquel until she does research on this medicine. Discontinue Seroquel, continue IV fluids only up to 250 cc only. Discussed this with patient's nurse Richardson Landry. Called ID at [facility](paged at [phone]),ZT response yet.will call again tomorrow as it is not urgent. NO ID coverage in house at [place]. All the records are reviewed and case discussed with Care Management/Social Workerr. Management plans discussed with the patient, family and they are in agreement.  CODE STATUS: DNR  TOTAL TIME  TAKING CARE OF THIS PATIENT: 35 minutes.   POSSIBLE D/C IN 2-3 DAYS, DEPENDING ON CLINICAL CONDITION.   Epifanio Lesches M.D on 02/22/2016 at 12:50 PM  Between 7am to 6pm - Pager - [phone]  After 6pm go to [email or web address] - password [email or web address] [facility]  Office  [phone]  CC: Primary care physician; [name], MD   Note: This dictation was prepared with Dragon dictation along with smaller phrase technology. Any transcriptional errors that result from this process are unintentional.

## 2016-08-26 ENCOUNTER — Encounter: Payer: Self-pay | Admitting: Family Medicine

## 2016-08-26 DIAGNOSIS — J90 Pleural effusion, not elsewhere classified: Secondary | ICD-10-CM

## 2016-08-26 DIAGNOSIS — I5033 Acute on chronic diastolic (congestive) heart failure: Secondary | ICD-10-CM

## 2016-08-26 DIAGNOSIS — Z7189 Other specified counseling: Secondary | ICD-10-CM

## 2016-08-26 DIAGNOSIS — Z515 Encounter for palliative care: Secondary | ICD-10-CM

## 2016-08-26 DIAGNOSIS — J189 Pneumonia, unspecified organism: Secondary | ICD-10-CM

## 2016-08-26 LAB — GLUCOSE, CAPILLARY
GLUCOSE-CAPILLARY: 167 mg/dL — AB (ref 65–99)
GLUCOSE-CAPILLARY: 186 mg/dL — AB (ref 65–99)
Glucose-Capillary: 138 mg/dL — ABNORMAL HIGH (ref 65–99)
Glucose-Capillary: 127 mg/dL — ABNORMAL HIGH (ref 65–99)

## 2016-08-26 LAB — CBC
HEMATOCRIT: 48.1 % (ref 40.0–52.0)
HEMOGLOBIN: 15.8 g/dL (ref 13.0–18.0)
MCH: 29.8 pg (ref 26.0–34.0)
MCHC: 32.8 g/dL (ref 32.0–36.0)
MCV: 90.7 fL (ref 80.0–100.0)
PLATELETS: 314 10*3/uL (ref 150–440)
RBC: 5.3 MIL/uL (ref 4.40–5.90)
RDW: 15.9 % — ABNORMAL HIGH (ref 11.5–14.5)
WBC: 17.4 10*3/uL — AB (ref 3.8–10.6)

## 2016-08-26 MED ORDER — PHENOL 1.4 % MT LIQD
1.0000 | OROMUCOSAL | Status: DC | PRN
  Administered 2016-08-26: 1 via OROMUCOSAL
  Filled 2016-08-26: qty 177

## 2016-08-26 NOTE — Progress Notes (Addendum)
[facility] at [place] NAME: Russell Howard    MR#:  [id number]  DATE OF BIRTH:  1934-03-23  SUBJECTIVe; constipation. Appetite slightly better. Also having some cough, occasional brown phlegm.   CHIEF COMPLAINT:   Chief Complaint  Patient presents with  . Respiratory Distress    REVIEW OF SYSTEMS:   Review of Systems  Respiratory: Negative for cough and shortness of breath.    CONSTITUTIONAL:  EYES: No blurred or double vision.  EARS, NOSE, AND THROAT: No tinnitus or ear pain.  RESPIRATORY:SOBbetter today.   CARDIOVASCULAR: No chest pain, orthopnea, edema.  GASTROINTESTINAL: No nausea, vomiting, diarrhea or abdominal pain.  GENITOURINARY: No dysuria, hematuria.  ENDOCRINE: No polyuria, nocturia,  HEMATOLOGY: No anemia, easy bruising or bleeding SKIN: No rash or lesion. MUSCULOSKELETAL: No joint pain or arthritis.   NEUROLOGIC: No tingling, numbness, weakness.  PSYCHIATRY: No anxiety or depression.   DRUG ALLERGIES:   Allergies  Allergen Reactions  . Penicillin G Hives    Has patient had a PCN reaction causing immediate rash, facial/tongue/throat swelling, SOB or lightheadedness with hypotension: Yes Has patient had a PCN reaction causing severe rash involving mucus membranes or skin necrosis: No Has patient had a PCN reaction that required hospitalization No Has patient had a PCN reaction occurring within the last 10 years: No If all of the above answers are "NO", then may proceed with Cephalosporin use.  . Sulfa Antibiotics Rash    VITALS:  Blood pressure (!) 99/54, pulse (!) 112, temperature 98 F (36.7 C), temperature source Oral, resp. rate 20, height 5\' 5"  (1.651 m), weight 80 kg (176 lb 6.4 oz), SpO2 98 %.  PHYSICAL EXAMINATION:  GENERAL:  82 y.o.-year-old patient lying in the bed ,breathing more comfortably today  EYES: Pupils equal, round, reactive to light and accommodation. No scleral icterus. Extraocular  muscles intact.  HEENT: Head atraumatic, normocephalic. Oropharynx and nasopharynx clear.  NECK:  Supple, no jugular venous distention. No thyroid enlargement, no tenderness.  LUNGS:  Mostly clear to auscultation. CARDIOVASCULAR: S1, S2 normal. No murmurs, rubs, or gallops.  ABDOMEN: Soft, nontender, nondistended. Bowel sounds present. No organomegaly or mass.  EXTREMITIES: No pedal edema, cyanosis, or clubbing.  NEUROLOGIC: Cranial nerves II through XII are intact. Muscle strength 5/5 in all extremities. Sensation intact. Gait not checked.  PSYCHIATRIC: The patient is alert and oriented x 3.  SKIN: No obvious rash, lesion, or ulcer.    LABORATORY PANEL:   CBC  Recent Labs Lab 08/26/16 0555  WBC 17.4*  HGB 15.8  HCT 48.1  PLT 314   ------------------------------------------------------------------------------------------------------------------  Chemistries   Recent Labs Lab 08/24/16 1217  NA 132*  K 4.8  CL 98*  CO2 24  GLUCOSE 271*  BUN 43*  CREATININE 1.00  CALCIUM 9.3  MG 1.9   ------------------------------------------------------------------------------------------------------------------  Cardiac Enzymes No results for input(s): TROPONINI in the last 168 hours. ------------------------------------------------------------------------------------------------------------------  RADIOLOGY:  No results found.  EKG:   Orders placed or performed during the hospital encounter of 08/19/16  . ED EKG 12-Lead  . ED EKG 12-Lead  . EKG 12-Lead  . EKG 12-Lead  . EKG    ASSESSMENT AND PLAN:   Acute respiratory failure secondary to bilateral pneumonia, acute on chronic systolic heart failure.sowly improving Continue oxygen to keep sats around 89-90%, Patient is on 2 L of oxygen and saturation 96%. s/ppost thoracocentesis, about 400 mL of purulent fluid drained from the right lung.cultures  from pleural fluid are  negative.  on cefepime. At discharge patient needs  Levaquin. possible discharge tomorrow.   chronic systolic heart failure with unknown baseline History of AICD placement history of chronic atrial fibrillation, history of metastatic lung cancer status post treatment. Previous echo showed EF 45%. Patient says Dr. Ubaldo Glassing from [facility] cardiology      #3 COPD exacerbation: improving;Continue oxygen, IV , bronchodilators. leukocytosis due to steroids. Now he is off the steroids. .  4,bilateral pleural effusions, history of lung cancer. [place] of state, no medical records in care everywhere.s/p thoracocentesis 2 days ago;now fluid cultures are negative.seen by DR.simmonds recommends Levaquin for 14 days   #5. diabetes mellitus type 2. Hb a1 c 7.4poor mouth intake. Continue sliding scale insulin coverage, hold Lantus. Po intake slighltly better.  Hypotension, tachycardia continue midodrine,   Does not want to go to rehabilitation. Likely discharge tomorrow or day after depending on clinical improvement  D/w daughter Management plans discussed with the patient, family and they are in agreement.  CODE STATUS: DNR  TOTAL TIME TAKING CARE OF THIS PATIENT: 35 minutes.   POSSIBLE D/C IN 2-3 DAYS, DEPENDING ON CLINICAL CONDITION.   Epifanio Lesches M.D on 08/26/2016 at 2:38 PM  Between 7am to 6pm - Pager - [phone]  After 6pm go to [email or web address] - password [email or web address] [facility]  Office  [phone]  CC: Primary care physician; [name], MD   Note: This dictation was prepared with Dragon dictation along with smaller phrase technology. Any transcriptional errors that result from this process are unintentional.

## 2016-08-26 NOTE — Consult Note (Signed)
Consultation Note Date: 08/26/2016   Patient Name: Russell Howard  DOB: [birth year]  MRN: [id number]  Age / Sex: 82 y.o., male  PCP: Russell Crouch, MD Referring Physician: Epifanio Lesches, MD  Reason for Consultation: Establishing goals of care  HPI/Patient Profile: 82 y.o. male  with past medical history of chronic diastolic heart failure, cardiomyopathy, defibrillator, atrial fibrillation, lung cancer, fibrosis, recurrent pleural effusions, hypothyroidism, neuropathy, GERD, and BPH admitted on 08/19/2016 with shortness of breath, cough, and weakness. In ED, patient with acute respiratory failure secondary to bilateral pneumonia, acute on chronic systolic heart failure, and pleural effusions. Did not tolerate BiPAP on admit. S/p thoracentesis right lung with 400cc drained. Also with COPD exacerbation and receiving oxygen, IV steroids, and bronchodilators. Hypotension and tachycardia likely due to dehydration. Palliative medicine consultation for goals of care.   Clinical Assessment and Goals of Care: I have reviewed medical records, discussed with Dr. Vianne Howard, and met with patient at bedside to discuss diagnosis, [place], EOL wishes, disposition and options. Shortly after, met with daughter-in-law Russell Howard) in family waiting room. Patient seen by PMT in December 2017. Notes reviewed.    Introduced Palliative Medicine as specialized medical care for people living with serious illness. It focuses on providing relief from the symptoms and stress of a serious illness. The goal is to improve quality of life for both the patient and the family.  We discussed a brief life review of the patient. Lives with family. Russell Howard is primary caregiver and HCPOA. She speaks of their daily routine. Prior to hospitalization, Russell Howard able to ambulate with walker and with good appetite. Russell Howard assists with ADL's and takes him frequently to  church services and events, which is very important to him. He wears 2L nasal cannula.   Discussed hospital diagnoses, interventions, and underlying co-morbidities. He no longer follows up with oncology. (Did not want to pursue further biopsy, invasive workup or treatment for possible cancer). Patient and daughter both tell me "the cancer is gone."  The patient believes "the doctors gave up but God stepped in and healed me." He speaks of staying well and out of the hospital for many months. Today he states "I am feeling better" and seems in good spirits. Russell Howard shares that she did not think he would leave the hospital this time and that he has recently been telling her he's ready to die. She believes he needs to "continue to fight" to see his grandson be baptized.   I attempted to elicit values and goals of care important to the patient. It remains important for the patient to stay home. They have declined SNF for rehab.   Advanced directives, concepts specific to code status, artifical feeding and hydration, and rehospitalization were considered and discussed. Patient and Russell Howard confirm DNR/DNI. The patient is not afraid of dying. Russell Howard states "I am selfish and do not want to loose him" but also speaks of praying for him not to suffer when his time comes. At this point, she "cannot sit here and watch him die"  therefore will continue antibiotics/fluids if indicated.   Russell Howard is concerned about him having diarrhea with oral antibiotics and wondering if he can continue IV antibiotics. Discussed risk for infection with continued IV antibiotics/PICC line on discharge. Encouraged probiotics with oral antibiotics.   Currently followed by palliative care outpatient. Educated on support hospice can provide for them in the future if he should continue to decline. This would allow him to be at home at the EOL. Russell Howard understands palliative services can transition to hospice services when they are ready.   Questions and  concerns were addressed. Therapeutic listening and emotional/spiritual support provided.     SUMMARY OF RECOMMENDATIONS    DNR/DNI confirmed by patient and daughter.   Continue current interventions.   Declining SNF for rehab. Plan is to return home when medically stable.   Palliative services to continue to follow home on discharge. Daughter understands this can transition to hospice services when appropriate--also will support him being home at the EOL (which has remained important to him).   Code Status/Advance Care Planning:  DNR  Symptom Management:   Per attending  Palliative Prophylaxis:   Aspiration, Delirium Protocol, Frequent Pain Assessment, Oral Care and Turn Reposition  Psycho-social/Spiritual:   Desire for further Chaplaincy support:yes  Additional Recommendations: Caregiving  Support/Resources and Education on Hospice  Prognosis:   Unable to determine  Discharge Planning: Home with Home Health palliative services to follow      Primary Diagnoses: Present on Admission: . Acute on chronic respiratory failure with hypoxia ([place]) . Acute on chronic respiratory failure ([place])   I have reviewed the medical record, interviewed the patient and family, and examined the patient. The following aspects are pertinent.  Past Medical History:  Diagnosis Date  . Acquired hypothyroidism 11/02/2014  . BPH (benign prostatic hyperplasia)   . Cardiac defibrillator in place 11/02/2014  . Cardiomyopathy ([place])   . CHF (congestive heart failure) ([place])   . Chronic diastolic heart failure ([place]) 11/02/2014  . Gastro-esophageal reflux disease without esophagitis 11/02/2014  . H/O malignant neoplasm 11/09/2014  . Heart valve disease 11/02/2014  . History of atrial fibrillation 11/02/2014  . Lung cancer ([place])   . Neuropathy 11/02/2014  . Orthostasis 11/02/2014  . Pure hypercholesterolemia 11/02/2014  . Valvular heart disease    Social History   Social History  . Marital  status: Widowed    Spouse name: N/A  . Number of children: N/A  . Years of education: N/A   Social History Main Topics  . Smoking status: Former Smoker    Types: Cigarettes    Quit date: 11/21/1981  . Smokeless tobacco: Never Used  . Alcohol use No  . Drug use: No  . Sexual activity: Not Currently   Other Topics Concern  . None   Social History Narrative  . None   Family History  Problem Relation Age of Onset  . Hypertension Unknown   . Heart disease Unknown    Scheduled Meds: . acidophilus  1 capsule Oral Daily  . aspirin EC  81 mg Oral Daily  . cholecalciferol  5,000 Units Oral Daily  . cyanocobalamin  500 mcg Oral Daily  . digoxin  0.125 mg Oral Daily  . enoxaparin (LOVENOX) injection  40 mg Subcutaneous Q24H  . gabapentin  300 mg Oral BID  . insulin aspart  0-15 Units Subcutaneous TID WC  . insulin aspart  0-5 Units Subcutaneous QHS  . ipratropium-albuterol  3 mL Nebulization TID  . levothyroxine  50 mcg Oral  QAC breakfast  . magnesium oxide  400 mg Oral Daily  . methenamine  1,000 mg Oral QID  . midodrine  5 mg Oral TID WC  . multivitamin with minerals  1 tablet Oral Daily  . pantoprazole  40 mg Oral QAC breakfast  . simvastatin  20 mg Oral q1800  . vitamin C  500 mg Oral TID  . vitamin E  400 Units Oral Daily   Continuous Infusions: . ceFEPime (MAXIPIME) IV Stopped (08/26/16 0957)   PRN Meds:.acetaminophen **OR** acetaminophen, guaiFENesin-dextromethorphan, ipratropium-albuterol, ondansetron **OR** ondansetron (ZOFRAN) IV Medications Prior to Admission:  Prior to Admission medications   Medication Sig Start Date End Date Taking? Authorizing Provider  acidophilus (RISAQUAD) CAPS capsule Take 1 capsule by mouth daily.   Yes [provider]  Ascorbic Acid (VITAMIN C) 1000 MG tablet Take 500 mg by mouth 3 (three) times daily. Reported on 06/14/2015   Yes [provider]  aspirin EC 81 MG tablet Take 81 mg by mouth daily.    Yes [provider]  Cholecalciferol 5000 units TABS Take 5,000 Units by mouth every morning.   Yes [provider]  Coenzyme Q10 (TH CO Q-10) 100 MG capsule Take 100 mg by mouth daily.    Yes [provider]  Petroleum 250 MCG tablet Take 250 mcg by mouth daily.   Yes [provider]  furosemide (LASIX) 20 MG tablet Take 20 mg by mouth every other day as needed (as needed for water retention).  05/10/15  Yes [provider]  gabapentin (NEURONTIN) 600 MG tablet Take 600 mg by mouth 2 (two) times daily.    Yes [provider]  hydrocortisone (CORTEF) 10 MG tablet Take 10 mg by mouth 2 (two) times daily.    Yes [provider]  ipratropium-albuterol (DUONEB) 0.5-2.5 (3) MG/3ML SOLN Take 3 mLs by nebulization every 6 (six) hours as needed. 06/12/16  Yes Wilhelmina Mcardle, MD  levothyroxine (SYNTHROID, LEVOTHROID) 50 MCG tablet Take 50 mcg by mouth daily before breakfast.   Yes [provider]  MAGNESIUM-OXIDE 400 (241.3 Mg) MG tablet Take 400 mg by mouth daily.   Yes [provider]  methenamine (HIPREX) 1 g tablet TAKE 1 TABLET BY MOUTH TWICE DAILY WITH MEALS 07/31/16  Yes McGowan, Larene Beach A, PA-C  midodrine (PROAMATINE) 5 MG tablet Take 5 mg by mouth 3 (three) times daily with meals.   Yes [provider]  Multiple Vitamin (MULTIVITAMIN WITH MINERALS) TABS tablet Take 1 tablet by mouth daily.   Yes [provider]  pantoprazole (PROTONIX) 40 MG tablet Take 40 mg by mouth daily before breakfast.    Yes [provider]  potassium chloride (MICRO-K) 10 MEQ CR capsule Take 10 mEq by mouth daily as needed (only with lasix).    Yes [provider]  SAW PALMETTO-PUMPKIN SEED OIL PO Take 1 capsule by mouth daily.    Yes [provider]  simvastatin (ZOCOR) 20 MG tablet Take 20 mg by mouth daily at 6 PM.    Yes [provider]  vitamin B-12 (CYANOCOBALAMIN) 500 MCG tablet Take 500 mcg by mouth  daily.    Yes [provider]  vitamin E 400 UNIT capsule Take 400 Units by mouth daily.    Yes [provider]   Allergies  Allergen Reactions  . Penicillin G Hives    Has patient had a PCN reaction causing immediate rash, facial/tongue/throat swelling, SOB or lightheadedness with hypotension: Yes Has patient  had a PCN reaction causing severe rash involving mucus membranes or skin necrosis: No Has patient had a PCN reaction that required hospitalization No Has patient had a PCN reaction occurring within the last 10 years: No If all of the above answers are "NO", then may proceed with Cephalosporin use.  . Sulfa Antibiotics Rash   Review of Systems  Constitutional: Positive for activity change and fatigue.  Respiratory: Positive for cough and shortness of breath.   Neurological: Positive for weakness.   Physical Exam  Constitutional: He is oriented to person, place, and time. He is cooperative.  HENT:  Head: Normocephalic and atraumatic.  Cardiovascular: Regular rhythm.   Pulmonary/Chest: Effort normal. No accessory muscle usage. No tachypnea. No respiratory distress. He has decreased breath sounds.  2L  Abdominal: Normal appearance.  Neurological: He is alert and oriented to person, place, and time.  Skin: Skin is warm and dry.  Psychiatric: He has a normal mood and affect. His speech is normal and behavior is normal. Cognition and memory are normal.  Nursing note and vitals reviewed.  Vital Signs: BP (!) 99/54   Pulse (!) 112   Temp 98 F (36.7 C) (Oral)   Resp 20   Ht '5\' 5"'$  (1.651 m)   Wt 80 kg (176 lb 6.4 oz)   SpO2 98%   BMI 29.35 kg/m  Pain Assessment: No/denies pain   Pain Score: 0-No pain  SpO2: SpO2: 98 % O2 Device:SpO2: 98 % O2 Flow Rate: .O2 Flow Rate (L/min): 2 L/min  IO: Intake/output summary:   Intake/Output Summary (Last 24 hours) at 08/26/16 1550 Last data filed at 08/26/16 1437  Gross per 24 hour  Intake              410 ml    Output              875 ml  Net             -465 ml    LBM: Last BM Date: 08/25/16 Baseline Weight: Weight: 81.6 kg (180 lb) Most recent weight: Weight: 80 kg (176 lb 6.4 oz)     Palliative Assessment/Data: PPS 40%   Flowsheet Rows     Most Recent Value  Intake Tab  Referral Department  Hospitalist  Unit at Time of Referral  Cardiac/Telemetry Unit  Palliative Care Primary Diagnosis  Other (Comment) [CHF, pleural effusions, lung CA]  Date Notified  08/25/16  Palliative Care Type  Return patient Palliative Care  Reason for referral  Clarify Goals of Care  Date of Admission  08/19/16  Date first seen by Palliative Care  08/26/16  # of days Palliative referral response time  1 Day(s)  # of days IP prior to Palliative referral  6  Clinical Assessment  Palliative Performance Scale Score  40%  Psychosocial & Spiritual Assessment  Palliative Care Outcomes  Patient/Family meeting held?  Yes  Who was at the meeting?  patient and daughter  Palliative Care Outcomes  Clarified goals of care, Counseled regarding hospice, Provided end of life care assistance, Provided psychosocial or spiritual support, Linked to palliative care logitudinal support, ACP counseling assistance      Time In/Out: 0815-0830, 1300-1400 Time Total: 81mn Greater than 50%  of this time was spent counseling and coordinating care related to the above assessment and plan.  Signed by:  MIhor Dow FNP-C Palliative Medicine Team  Phone: [phone]: [phone]  Please contact Palliative Medicine Team phone at [phone] questions and concerns.  For individual provider: See Shea Evans

## 2016-08-26 NOTE — Progress Notes (Signed)
Chaplain visited with patient and daughter, while on rounds. Said hello and provided ministry of presence.     08/26/16 1630  Clinical Encounter Type  Visited With Patient;Patient and family together  Visit Type Initial;Spiritual support  Referral From Chaplain  Consult/Referral To Chaplain  Spiritual Encounters  Spiritual Needs Other (Comment)

## 2016-08-27 DIAGNOSIS — R531 Weakness: Secondary | ICD-10-CM

## 2016-08-27 LAB — CBC
HEMATOCRIT: 45.9 % (ref 40.0–52.0)
HEMOGLOBIN: 15.4 g/dL (ref 13.0–18.0)
MCH: 29.6 pg (ref 26.0–34.0)
MCHC: 33.5 g/dL (ref 32.0–36.0)
MCV: 88.5 fL (ref 80.0–100.0)
Platelets: 303 10*3/uL (ref 150–440)
RBC: 5.19 MIL/uL (ref 4.40–5.90)
RDW: 16.2 % — AB (ref 11.5–14.5)
WBC: 18.4 10*3/uL — ABNORMAL HIGH (ref 3.8–10.6)

## 2016-08-27 LAB — GLUCOSE, CAPILLARY
GLUCOSE-CAPILLARY: 170 mg/dL — AB (ref 65–99)
Glucose-Capillary: 153 mg/dL — ABNORMAL HIGH (ref 65–99)
Glucose-Capillary: 155 mg/dL — ABNORMAL HIGH (ref 65–99)
Glucose-Capillary: 146 mg/dL — ABNORMAL HIGH (ref 65–99)

## 2016-08-27 MED ORDER — MECLIZINE HCL 25 MG PO TABS
25.0000 mg | ORAL_TABLET | Freq: Four times a day (QID) | ORAL | Status: DC | PRN
  Filled 2016-08-27: qty 1

## 2016-08-27 MED ORDER — DIGOXIN 250 MCG PO TABS
0.2500 mg | ORAL_TABLET | Freq: Every day | ORAL | Status: DC
  Administered 2016-08-28 – 2016-08-30 (×3): 0.25 mg via ORAL
  Filled 2016-08-27 (×3): qty 1

## 2016-08-27 MED ORDER — SACCHAROMYCES BOULARDII 250 MG PO CAPS
250.0000 mg | ORAL_CAPSULE | Freq: Two times a day (BID) | ORAL | Status: DC
  Administered 2016-08-27 – 2016-08-30 (×5): 250 mg via ORAL
  Filled 2016-08-27 (×9): qty 1

## 2016-08-27 MED ORDER — LEVOFLOXACIN 500 MG PO TABS: 500.0000 mg | ORAL_TABLET | Freq: Every day | ORAL | 0 refills | Status: DC

## 2016-08-27 MED ORDER — GUAIFENESIN-DM 100-10 MG/5ML PO SYRP: 5.0000 mL | ORAL_SOLUTION | ORAL | 0 refills | Status: DC | PRN

## 2016-08-27 MED ORDER — LEVOFLOXACIN 500 MG PO TABS
500.0000 mg | ORAL_TABLET | Freq: Every day | ORAL | Status: DC
  Administered 2016-08-27 – 2016-08-28 (×2): 500 mg via ORAL
  Filled 2016-08-27 (×2): qty 1

## 2016-08-27 MED ORDER — MIDODRINE HCL 5 MG PO TABS
10.0000 mg | ORAL_TABLET | Freq: Three times a day (TID) | ORAL | Status: DC
  Administered 2016-08-28 – 2016-08-30 (×8): 10 mg via ORAL
  Filled 2016-08-27 (×8): qty 2

## 2016-08-27 MED ORDER — POLYETHYLENE GLYCOL 3350 17 G PO PACK
17.0000 g | PACK | Freq: Two times a day (BID) | ORAL | Status: AC
  Administered 2016-08-27 (×2): 17 g via ORAL
  Filled 2016-08-27 (×2): qty 1

## 2016-08-27 MED ORDER — CALCIUM CARBONATE ANTACID 500 MG PO CHEW: 2.0000 | CHEWABLE_TABLET | Freq: Two times a day (BID) | ORAL | Status: DC | PRN

## 2016-08-27 MED ORDER — DIGOX 250 MCG PO TABS: 125.0000 ug | ORAL_TABLET | Freq: Every day | ORAL | Status: DC

## 2016-08-27 NOTE — Care Management (Addendum)
Attending anticipates discharge home today with home health and palliative care.  There has been discussion with daughter regarding hospice focused plan of care but daughter is reluctant.  Discussed completion of MOST with palliative.  Patient is high risk for readmission due to progression of his medical condition. Have requested [place] even though this CM hri concern whether this will decrease likelihood of readmission.

## 2016-08-27 NOTE — Progress Notes (Signed)
Daily Progress Note   Patient Name: Russell Howard       Date: 08/27/2016 DOB: 10/09/1934  Age: 82 y.o. MRN#: [id number] Attending Physician: Hillary Bow, MD Primary Care Physician: [name], MD Admit Date: 08/19/2016  Reason for Consultation/Follow-up: Establishing goals of care  Subjective/GOC: Follow up with patient and DIL [name]). He is worried about discharge today because he feels so weak. He does not think [name] can handle caring for him. He asks me about going to rehab facility. [name] would rather he NOT go to rehab but wants him to make decisions.   Discussed disposition options in detail--rehab versus home with home health versus home with hospice. [name] is receptive to hospice services if Mr. Kazmi is ready. He continuously states "I'm tired." Discussed hospice focus of comfort, quality, dignity at EOL. Discussed focus on symptom management and preventing recurrent hospitalization. Being at home at the EOL which has remained important to him.   By the end of the conversation, patient cannot decide whether he would like home health or hospice. "It's a hard decision." [name] has decided to continue with discharge plan with home health understanding palliative services can transition to hospice at any time--in the next day or two if they request. She plans to follow up next week with two outpatient doctors.   Therapeutic listening and emotional/spiritual support provided. Very spiritual family. The patient is not afraid of dying.   Length of Stay: 8  Current Medications: Scheduled Meds:  . acidophilus  1 capsule Oral Daily  . aspirin EC  81 mg Oral Daily  . cholecalciferol  5,000 Units Oral Daily  . cyanocobalamin  500 mcg Oral Daily  . digoxin  0.125 mg Oral Daily  .  enoxaparin (LOVENOX) injection  40 mg Subcutaneous Q24H  . gabapentin  300 mg Oral BID  . insulin aspart  0-15 Units Subcutaneous TID WC  . insulin aspart  0-5 Units Subcutaneous QHS  . ipratropium-albuterol  3 mL Nebulization TID  . levofloxacin  500 mg Oral Daily  . levothyroxine  50 mcg Oral QAC breakfast  . magnesium oxide  400 mg Oral Daily  . methenamine  1,000 mg Oral QID  . midodrine  5 mg Oral TID WC  . multivitamin with minerals  1 tablet Oral Daily  . pantoprazole  40  mg Oral QAC breakfast  . polyethylene glycol  17 g Oral BID  . saccharomyces boulardii  250 mg Oral BID  . simvastatin  20 mg Oral q1800  . vitamin C  500 mg Oral TID  . vitamin E  400 Units Oral Daily    Continuous Infusions:   PRN Meds: acetaminophen **OR** acetaminophen, calcium carbonate, guaiFENesin-dextromethorphan, ipratropium-albuterol, ondansetron **OR** ondansetron (ZOFRAN) IV, phenol  Physical Exam  Constitutional: He is oriented to person, place, and time. He is cooperative. He appears ill.  HENT:  Head: Normocephalic and atraumatic.  Cardiovascular: Regular rhythm.   Pulmonary/Chest: No accessory muscle usage. No tachypnea. No respiratory distress. He has decreased breath sounds.  Intermittent dyspnea at rest  Abdominal: Normal appearance.  Neurological: He is alert and oriented to person, place, and time.  Skin: Skin is warm and dry.  Psychiatric: His speech is normal and behavior is normal. Cognition and memory are normal. He exhibits a depressed mood.  Nursing note and vitals reviewed.          Vital Signs: BP 104/66 (BP Location: Right Arm)   Pulse (!) 106   Temp 98 F (36.7 C) (Oral)   Resp 18   Ht 5\' 5"  (1.651 m)   Wt 81 kg (178 lb 8.5 oz)   SpO2 98%   BMI 29.71 kg/m  SpO2: SpO2: 98 % O2 Device: O2 Device: Nasal Cannula O2 Flow Rate: O2 Flow Rate (L/min): 2 L/min  Intake/output summary:  Intake/Output Summary (Last 24 hours) at 08/27/16 1617 Last data filed at 08/27/16  1300  Gross per 24 hour  Intake              890 ml  Output              800 ml  Net               90 ml   LBM: Last BM Date:  (unknown) Baseline Weight: Weight: 81.6 kg (180 lb) Most recent weight: Weight: 81 kg (178 lb 8.5 oz)       Palliative Assessment/Data: PPS 40%   Flowsheet Rows     Most Recent Value  Intake Tab  Referral Department  Hospitalist  Unit at Time of Referral  Cardiac/Telemetry Unit  Palliative Care Primary Diagnosis  Other (Comment) [CHF, pleural effusions, lung CA]  Date Notified  08/25/16  Palliative Care Type  Return patient Palliative Care  Reason for referral  Clarify Goals of Care  Date of Admission  08/19/16  Date first seen by Palliative Care  08/26/16  # of days Palliative referral response time  1 Day(s)  # of days IP prior to Palliative referral  6  Clinical Assessment  Palliative Performance Scale Score  40%  Psychosocial & Spiritual Assessment  Palliative Care Outcomes  Patient/Family meeting held?  Yes  Who was at the meeting?  patient and daughter  Palliative Care Outcomes  Clarified goals of care, Counseled regarding hospice, Provided end of life care assistance, Provided psychosocial or spiritual support, Linked to palliative care logitudinal support, ACP counseling assistance      Patient Active Problem List   Diagnosis Date Noted  . Community acquired pneumonia   . Pleural effusion   . Palliative care by specialist   . Acute on chronic respiratory failure with hypoxia ([place]) 08/19/2016  . Acute on chronic respiratory failure ([place]) 08/19/2016  . Septic shock ([place]) 03/02/2016  . Hydronephrosis, right 01/14/2016  . Constipation 01/13/2016  . Urinary retention  01/13/2016  . Lower abdominal pain 01/13/2016  . Bacteremia 01/13/2016  . Hypotension 01/13/2016  . Palliative care encounter   . Goals of care, counseling/discussion   . Cough   . Encounter for hospice care discussion   . Hypoxia 01/09/2016  . 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 Sepsis ([place]) 06/14/2015  . Lower urinary tract infectious disease 06/14/2015    Class: Acute  . H/O malignant neoplasm 11/09/2014  . Acquired hypothyroidism 11/02/2014  . Cardiomyopathy ([place]) 11/02/2014  . Acute on chronic diastolic CHF (congestive heart failure) ([place]) 11/02/2014  . Diabetes mellitus without complication ([place]) 27/25/3664  . Cardiac defibrillator in place 11/02/2014  . Gastro-esophageal reflux disease without esophagitis 11/02/2014  . History of atrial fibrillation 11/02/2014  . BP (high blood pressure) 11/02/2014  . Neuropathy 11/02/2014  . Orthostasis 11/02/2014  . Pure hypercholesterolemia 11/02/2014  . Heart valve disease 11/02/2014    Palliative Care Assessment & Plan   Patient Profile: 82 y.o. male  with past medical history of chronic diastolic heart failure, cardiomyopathy, defibrillator, atrial fibrillation, lung cancer, fibrosis, recurrent pleural effusions, hypothyroidism, neuropathy, GERD, and BPH admitted on 08/19/2016 with shortness of breath, cough, and weakness. In ED, patient with acute respiratory failure secondary to bilateral pneumonia, acute on chronic systolic heart failure, and pleural effusions. Did not tolerate BiPAP on admit. S/p thoracentesis right lung with 400cc drained. Also with COPD exacerbation and receiving oxygen, IV steroids, and bronchodilators. Hypotension and tachycardia likely due to dehydration. Palliative medicine consultation for goals of care.   Assessment: Acute respiratory failure  Bilateral pneumonia Acute on chronic systolic heart failure COPD exacerbation Bilateral pleural effusions s/p thoracentesis Orthostatic hypotension Hx of lung cancer  Recommendations/Plan:  DNR/DNI  Discussed in detail home health versus hospice. They have chosen to continue with home health plan on discharge understanding palliative services can transition to hospice services at any time.    Code Status: DNR   Code Status Orders        Start     Ordered   08/19/16 1316  Do not attempt resuscitation (DNR)  Continuous    Question Answer Comment  In the event of cardiac or respiratory ARREST Do not call a "code blue"   In the event of cardiac or respiratory ARREST Do not perform Intubation, CPR, defibrillation or ACLS   In the event of cardiac or respiratory ARREST Use medication by any route, position, wound care, and other measures to relive pain and suffering. May use oxygen, suction and manual treatment of airway obstruction as needed for comfort.      08/19/16 1315    Code Status History    Date Active Date Inactive Code Status Order ID Comments User Context   03/02/2016 10:36 PM 03/05/2016  8:20 PM DNR [id number]  Mikael Spray, NP ED   01/09/2016  6:58 AM 01/14/2016  7:39 PM DNR [id number]  Harrie Foreman, MD Inpatient   11/09/2015  3:18 AM 11/12/2015  5:13 PM DNR [id number]  Harrie Foreman, MD Inpatient   06/14/2015  4:39 PM 06/18/2015  3:13 PM DNR [id number]  Demetrios Loll, MD Inpatient    Advance Directive Documentation     Most Recent Value  Type of Advance Directive  Living will  Pre-existing out of facility DNR order (yellow form or pink MOST form)  -  "MOST" Form in Place?  -       Prognosis:   Unable to determine: guarded with pneumonia, underlying  COPD and acute on chronic CHF,decreased functional and nutritional status, and high risk for recurrent hospitalization.   Discharge Planning:  Home with Home Health palliative services to follow  Care plan was discussed with patient, daughter, RN CM, and Dr. Darvin Neighbours  Thank you for allowing the Palliative Medicine Team to assist in the care of this patient.   Time In: 1520 Time Out: 1630 Total Time 48min Prolonged Time Billed  yes      Greater than 50%  of this time was spent counseling and coordinating care related to the above assessment and plan.  Ihor Dow, FNP-C Palliative Medicine Team   Phone: [phone] Fax: [phone]  Please contact Palliative Medicine Team phone at [phone] for questions and concerns.

## 2016-08-27 NOTE — Progress Notes (Signed)
[facility] [place] NAME: Russell Howard    MR#:  [id number]  DATE OF BIRTH:  Oct 18, 1934    CHIEF COMPLAINT:   Chief Complaint  Patient presents with  . Respiratory Distress   Feels weak. Poor appetite. Dizzy on standing up. Constipated  D/C cancelled as pt got dizzy on sitting in wheel chair and threw up  REVIEW OF SYSTEMS:   Review of Systems  Respiratory: Negative for cough and shortness of breath.    CONSTITUTIONAL:  EYES: No blurred or double vision.  EARS, NOSE, AND THROAT: No tinnitus or ear pain.  RESPIRATORY:SOBbetter today.   CARDIOVASCULAR: No chest pain, orthopnea, edema.  GASTROINTESTINAL: No nausea, vomiting, diarrhea or abdominal pain.  GENITOURINARY: No dysuria, hematuria.  ENDOCRINE: No polyuria, nocturia,  HEMATOLOGY: No anemia, easy bruising or bleeding SKIN: No rash or lesion. MUSCULOSKELETAL: No joint pain or arthritis.   NEUROLOGIC: No tingling, numbness, weakness.  PSYCHIATRY: No anxiety or depression.   DRUG ALLERGIES:   Allergies  Allergen Reactions  . Penicillin G Hives    Has patient had a PCN reaction causing immediate rash, facial/tongue/throat swelling, SOB or lightheadedness with hypotension: Yes Has patient had a PCN reaction causing severe rash involving mucus membranes or skin necrosis: No Has patient had a PCN reaction that required hospitalization No Has patient had a PCN reaction occurring within the last 10 years: No If all of the above answers are "NO", then may proceed with Cephalosporin use.  . Sulfa Antibiotics Rash    VITALS:  Blood pressure (!) 111/96, pulse (!) 120, temperature 97.8 F (36.6 C), temperature source Oral, resp. rate 18, height 5\' 5"  (1.651 m), weight 81 kg (178 lb 8.5 oz), SpO2 96 %.  PHYSICAL EXAMINATION:  GENERAL:  82 y.o.-year-old patient lying in the bed. EYES: Pupils equal, round, reactive to light and accommodation. No scleral icterus. Extraocular  muscles intact.  HEENT: Head atraumatic, normocephalic. Oropharynx and nasopharynx clear.  NECK:  Supple, no jugular venous distention. No thyroid enlargement, no tenderness.  LUNGS:  Mostly clear to auscultation. CARDIOVASCULAR: S1, S2 normal. No murmurs, rubs, or gallops.  ABDOMEN: Soft, nontender, nondistended. Bowel sounds present. No organomegaly or mass.  EXTREMITIES: No pedal edema, cyanosis, or clubbing.  NEUROLOGIC: Cranial nerves II through XII are intact. Muscle strength 5/5 in all extremities. Sensation intact. Gait not checked.  PSYCHIATRIC: The patient is alert and oriented x 3.  SKIN: No obvious rash, lesion, or ulcer.    LABORATORY PANEL:   CBC  Recent Labs Lab 08/27/16 0538  WBC 18.4*  HGB 15.4  HCT 45.9  PLT 303   ------------------------------------------------------------------------------------------------------------------  Chemistries   Recent Labs Lab 08/24/16 1217  NA 132*  K 4.8  CL 98*  CO2 24  GLUCOSE 271*  BUN 43*  CREATININE 1.00  CALCIUM 9.3  MG 1.9   ------------------------------------------------------------------------------------------------------------------  Cardiac Enzymes No results for input(s): TROPONINI in the last 168 hours. ------------------------------------------------------------------------------------------------------------------  RADIOLOGY:  No results found.  EKG:   Orders placed or performed during the hospital encounter of 08/19/16  . ED EKG 12-Lead  . ED EKG 12-Lead  . EKG 12-Lead  . EKG 12-Lead  . EKG    ASSESSMENT AND PLAN:   # Acute respiratory failure secondary to bilateral pneumonia, acute on chronic systolic heart failure Resolved  Patient is on 2 L of oxygen at home S/p post thoracocentesis, about 400 mL of purulent fluid drained from the right lung.cultures from pleural fluid  are negative. on cefepime. Will change to Levaquin as recommended by Pulmonary Dr. Alva Garnet  # Chronic systolic  heart failure with unknown baseline History of AICD placement history of chronic atrial fibrillation. Previous echo showed EF 45%. Patient sees Dr. Ubaldo Glassing from [facility] cardiology    #3 COPD exacerbation: improving;Continue oxygen, bronchodilators. leukocytosis due to steroids. Now he is off the steroids. .  4,bilateral pleural effusions, history of lung cancer. .s/p thoracocentesis    #5. diabetes mellitus type 2. Hb a1 c 7.4 poor mouth intake. Continue sliding scale insulin coverage, hold Lantus. Po intake slighltly better.  # orthostatic hypotension Chronic, worsening. Increase  Midodrine Check orthostatic vitals  Does not want to go to rehabilitation.  D/w daughter Management plans discussed with the patient, family and they are in agreement.  CODE STATUS: DNR  TOTAL TIME TAKING CARE OF THIS PATIENT: 35 minutes.   POSSIBLE D/C Russell Howard R M.D on 08/27/2016 at 9:58 PM  Between 7am to 6pm - Pager - [phone]  After 6pm go to [email or web address] - password [email or web address] [facility]  Office  [phone]  CC: Primary care physician; [name], MD   Note: This dictation was prepared with Dragon dictation along with smaller phrase technology. Any transcriptional errors that result from this process are unintentional.

## 2016-08-27 NOTE — Progress Notes (Signed)
Pt was scheduled for discharge. Pts IV is removed, telemetry box returned to Network engineer. Once in patients personal wheelchair patient began complaining of dizziness. Pt then began to vomit small amount of pink colored vomit. Rn notified MD . MD orders meclizine 25 Q6Hrs prn. And to cancel the discharge. I will continue to assess.

## 2016-08-27 NOTE — Plan of Care (Signed)
Problem: Pain Managment: Goal: General experience of comfort will improve Outcome: Progressing No complaints of pain this shift, will continue to monitor.  Problem: Tissue Perfusion: Goal: Risk factors for ineffective tissue perfusion will decrease Outcome: Completed/Met Date Met: 08/27/16 lovenox for VTE  Problem: Bowel/Gastric: Goal: Will not experience complications related to bowel motility Outcome: Not Progressing Pt does not remember the last time he has had a BM, states he normally has one every morning, but has been too weak to get up to have one.

## 2016-08-27 NOTE — Care Management (Signed)
Spoke with patient's daughter [name].  Discussed palliative involvement in the plan of care and discussed that may benefit from MOST discussion.  Discussed this could be done in the home. [name] says she will be able to get patient into the house.  Informed [facility] that patient to discharge and discussed the ordered services with [facility].  Have not received response to request for [facility]

## 2016-08-28 ENCOUNTER — Inpatient Hospital Stay: Payer: Medicare Other

## 2016-08-28 DIAGNOSIS — R531 Weakness: Secondary | ICD-10-CM

## 2016-08-28 LAB — URINALYSIS, ROUTINE W REFLEX MICROSCOPIC
BACTERIA UA: NONE SEEN
Bilirubin Urine: NEGATIVE
Glucose, UA: NEGATIVE mg/dL
HGB URINE DIPSTICK: NEGATIVE
Ketones, ur: NEGATIVE mg/dL
Leukocytes, UA: NEGATIVE
Nitrite: NEGATIVE
PROTEIN: 30 mg/dL — AB
SQUAMOUS EPITHELIAL / LPF: NONE SEEN
Specific Gravity, Urine: 1.013 (ref 1.005–1.030)
pH: 5 (ref 5.0–8.0)

## 2016-08-28 LAB — BASIC METABOLIC PANEL
Anion gap: 9 (ref 5–15)
BUN: 64 mg/dL — ABNORMAL HIGH (ref 6–20)
CHLORIDE: 96 mmol/L — AB (ref 101–111)
CO2: 26 mmol/L (ref 22–32)
Calcium: 9.6 mg/dL (ref 8.9–10.3)
Creatinine, Ser: 1.04 mg/dL (ref 0.61–1.24)
GFR calc non Af Amer: >60 mL/min (ref >60)
Glucose, Bld: 164 mg/dL — ABNORMAL HIGH (ref 65–99)
POTASSIUM: 5.3 mmol/L — AB (ref 3.5–5.1)
SODIUM: 131 mmol/L — AB (ref 135–145)

## 2016-08-28 LAB — GLUCOSE, CAPILLARY
GLUCOSE-CAPILLARY: 161 mg/dL — AB (ref 65–99)
GLUCOSE-CAPILLARY: 197 mg/dL — AB (ref 65–99)
GLUCOSE-CAPILLARY: 180 mg/dL — AB (ref 65–99)
Glucose-Capillary: 169 mg/dL — ABNORMAL HIGH (ref 65–99)

## 2016-08-28 LAB — MAGNESIUM: MAGNESIUM: 2.1 mg/dL (ref 1.7–2.4)

## 2016-08-28 MED ORDER — LACTULOSE 10 GM/15ML PO SOLN
30.0000 g | Freq: Once | ORAL | Status: AC
  Administered 2016-08-28: 30 g via ORAL
  Filled 2016-08-28: qty 60

## 2016-08-28 MED ORDER — LEVOFLOXACIN 500 MG PO TABS
500.0000 mg | ORAL_TABLET | Freq: Every day | ORAL | Status: DC
  Administered 2016-08-29 – 2016-08-30 (×2): 500 mg via ORAL
  Filled 2016-08-28 (×2): qty 1

## 2016-08-28 MED ORDER — SODIUM CHLORIDE 0.9 % IV BOLUS (SEPSIS)
500.0000 mL | Freq: Once | INTRAVENOUS | Status: AC
  Administered 2016-08-28: 500 mL via INTRAVENOUS

## 2016-08-28 MED ORDER — SORBITOL 70 % SOLN
960.0000 mL | TOPICAL_OIL | Freq: Once | ORAL | Status: AC
  Administered 2016-08-29: 960 mL via RECTAL
  Filled 2016-08-28: qty 240

## 2016-08-28 MED ORDER — IOPAMIDOL (ISOVUE-300) INJECTION 61%
100.0000 mL | Freq: Once | INTRAVENOUS | Status: AC | PRN
  Administered 2016-08-28: 100 mL via INTRAVENOUS

## 2016-08-28 MED ORDER — SODIUM POLYSTYRENE SULFONATE 15 GM/60ML PO SUSP
30.0000 g | Freq: Once | ORAL | Status: AC
  Administered 2016-08-28: 30 g via ORAL
  Filled 2016-08-28: qty 120

## 2016-08-28 MED ORDER — FLEET ENEMA 7-19 GM/118ML RE ENEM
1.0000 | ENEMA | Freq: Once | RECTAL | Status: AC
  Administered 2016-08-28: 1 via RECTAL

## 2016-08-28 NOTE — Progress Notes (Signed)
Spoke with dr. Darvin Neighbours to make aware patient c/o burning with urination, unsure of last bowel movement. Per will order UA, if no bowel movement by 1300 okay to give fleets enema x1. Will continue to monitor closely

## 2016-08-28 NOTE — Plan of Care (Signed)
Problem: Activity: Goal: Risk for activity intolerance will decrease Outcome: Not Progressing Pt pretty much bedrest, pt feels too weak to get up  Problem: Bowel/Gastric: Goal: Will not experience complications related to bowel motility Outcome: Not Progressing Still no BM, miralax given. Unknown when last BM was

## 2016-08-28 NOTE — Progress Notes (Signed)
Pt complaining of abdominal pain, unable to take medications because his stomach feels so bad. Pt has not had a BM since last Friday 7/20. Pt was given miralax yesterday & given kayexalate, lactulose & a fleets enema today with no BM. MD paged, Dr. Jannifer Franklin to put in orders for a CT scan of abdomen to assess for obstruction. Will continue to monitor. Conley Simmonds, RN, BSN

## 2016-08-28 NOTE — Progress Notes (Signed)
PT Cancellation Note  Patient Details Name: Russell Howard MRN: [id number] DOB: [birth year]   Cancelled Treatment:    Reason Eval/Treat Not Completed: Other (comment)   Offered and encouraged session.  Pt in bed, declining session.  "I just feel so bad".  Encouragement given and reviewed importance of daily exercise/mobility to maintain strength.  Chesley Noon 08/28/2016, 10:18 AM

## 2016-08-28 NOTE — Progress Notes (Signed)
Pt was unable to take night time medication, due to not feeling well, stomach aching & nauseated. Was able to get miralax down.

## 2016-08-28 NOTE — Progress Notes (Signed)
CT scan came back showing no obstruction, just constipation. Dr. Jannifer Franklin suggested doing another enema if pt wanted one, pt agreed that he did want another enema since his stomach hurts so bad. Dr. Jannifer Franklin to put in orders for a smog enema.

## 2016-08-28 NOTE — Progress Notes (Signed)
[place] at [place] NAME: Russell Howard    MR#:  [id number]  DATE OF BIRTH:  [birth year]    CHIEF COMPLAINT:   Chief Complaint  Patient presents with  . Respiratory Distress   Continues to feel poorly. Complains of nausea, diffuse abdominal pain, constipation. Poor appetite. Dizziness.  REVIEW OF SYSTEMS:   Review of Systems  Respiratory: Negative for cough and shortness of breath.    CONSTITUTIONAL:  EYES: No blurred or double vision.  EARS, NOSE, AND THROAT: No tinnitus or ear pain.  RESPIRATORY:SOBbetter today.   CARDIOVASCULAR: No chest pain, orthopnea, edema.  GASTROINTESTINAL: No nausea, vomiting, diarrhea or abdominal pain.  GENITOURINARY: No dysuria, hematuria.  ENDOCRINE: No polyuria, nocturia,  HEMATOLOGY: No anemia, easy bruising or bleeding SKIN: No rash or lesion. MUSCULOSKELETAL: No joint pain or arthritis.   NEUROLOGIC: No tingling, numbness, weakness.  PSYCHIATRY: No anxiety or depression.   DRUG ALLERGIES:   Allergies  Allergen Reactions  . Penicillin G Hives    Has patient had a PCN reaction causing immediate rash, facial/tongue/throat swelling, SOB or lightheadedness with hypotension: Yes Has patient had a PCN reaction causing severe rash involving mucus membranes or skin necrosis: No Has patient had a PCN reaction that required hospitalization No Has patient had a PCN reaction occurring within the last 10 years: No If all of the above answers are "NO", then may proceed with Cephalosporin use.  . Sulfa Antibiotics Rash    VITALS:  Blood pressure (!) 59/39, pulse (!) 118, temperature (!) 97.5 F (36.4 C), resp. rate 15, height 5\' 5"  (1.651 m), weight 80.4 kg (177 lb 4.8 oz), SpO2 94 %.  PHYSICAL EXAMINATION:  GENERAL:  82 y.o.-year-old patient lying in the bed. EYES: Pupils equal, round, reactive to light and accommodation. No scleral icterus. Extraocular muscles intact.  HEENT: Head atraumatic,  normocephalic. Oropharynx and nasopharynx clear.  NECK:  Supple, no jugular venous distention. No thyroid enlargement, no tenderness.  LUNGS:  Mostly clear to auscultation. CARDIOVASCULAR: S1, S2 normal. No murmurs, rubs, or gallops.  ABDOMEN: Soft, nontender, nondistended. Bowel sounds present. No organomegaly or mass.  EXTREMITIES: No pedal edema, cyanosis, or clubbing.  NEUROLOGIC: Cranial nerves II through XII are intact. Muscle strength 5/5 in all extremities. Sensation intact. Gait not checked.  PSYCHIATRIC: The patient is alert and oriented x 3.  SKIN: No obvious rash, lesion, or ulcer.    LABORATORY PANEL:   CBC  Recent Labs Lab 08/27/16 0538  WBC 18.4*  HGB 15.4  HCT 45.9  PLT 303   ------------------------------------------------------------------------------------------------------------------  Chemistries   Recent Labs Lab 08/28/16 0455  NA 131*  K 5.3*  CL 96*  CO2 26  GLUCOSE 164*  BUN 64*  CREATININE 1.04  CALCIUM 9.6  MG 2.1   ------------------------------------------------------------------------------------------------------------------  Cardiac Enzymes No results for input(s): TROPONINI in the last 168 hours. ------------------------------------------------------------------------------------------------------------------  RADIOLOGY:  No results found.  EKG:   Orders placed or performed during the hospital encounter of 08/19/16  . ED EKG 12-Lead  . ED EKG 12-Lead  . EKG 12-Lead  . EKG 12-Lead  . EKG    ASSESSMENT AND PLAN:   # orthostatic hypotension Chronic but now worsening due to dehydration IVF bolus STAT Significant drop. Increase midodrine dose  # Constipation. On MiraLAX. Given a dose of Kayexalate for hyperkalemia. If no bowel movements will try an enema.  # Acute respiratory failure secondary to bilateral pneumonia, acute on chronic systolic heart failure Resolved  Patient is on 2 L of oxygen at home S/p post  thoracocentesis, about 400 mL of purulent fluid drained from the right lung.cultures from pleural fluid are negative. Was on cefepime. changed to Levaquin as recommended by Pulmonary Dr. Alva Garnet  # Chronic systolic heart failure with unknown baseline History of AICD placement history of chronic atrial fibrillation. Previous echo showed EF 45%. Patient sees Dr. Ubaldo Glassing from [facility] cardiology    # COPD exacerbation: improving;Continue oxygen, bronchodilators. leukocytosis due to steroids. Now he is off the steroids.  # Bilateral pleural effusions, history of lung cancer. s/p thoracocentesis    # diabetes mellitus type 2. Hb a1 c 7.4 poor mouth intake. Continue sliding scale insulin coverage  Po intake slighltly better.  Does not want to go to rehabilitation.  D/w daughter-in-law  Management plans discussed with the patient, family and they are in agreement.  CODE STATUS: DNR  TOTAL TIME TAKING CARE OF THIS PATIENT: 35 minutes.   Hillary Bow R M.D on 08/28/2016 at 1:52 PM  Between 7am to 6pm - Pager - [phone]  After 6pm go to [email or web address] - password [email or web address] [facility]  Office  [phone]  CC: Primary care physician; [name], MD   Note: This dictation was prepared with Dragon dictation along with smaller phrase technology. Any transcriptional errors that result from this process are unintentional.

## 2016-08-29 DIAGNOSIS — J869 Pyothorax without fistula: Secondary | ICD-10-CM

## 2016-08-29 DIAGNOSIS — J9620 Acute and chronic respiratory failure, unspecified whether with hypoxia or hypercapnia: Secondary | ICD-10-CM

## 2016-08-29 DIAGNOSIS — A419 Sepsis, unspecified organism: Principal | ICD-10-CM

## 2016-08-29 DIAGNOSIS — J9601 Acute respiratory failure with hypoxia: Secondary | ICD-10-CM

## 2016-08-29 LAB — CBC WITH DIFFERENTIAL/PLATELET
Basophils Absolute: 0.1 10*3/uL (ref 0–0.1)
Basophils Relative: 0 %
Eosinophils Absolute: 0.1 10*3/uL (ref 0–0.7)
Eosinophils Relative: 0 %
HEMATOCRIT: 45.7 % (ref 40.0–52.0)
HEMOGLOBIN: 15.2 g/dL (ref 13.0–18.0)
LYMPHS ABS: 0.5 10*3/uL — AB (ref 1.0–3.6)
Lymphocytes Relative: 1 %
MCH: 29.8 pg (ref 26.0–34.0)
MCHC: 33.2 g/dL (ref 32.0–36.0)
MCV: 89.9 fL (ref 80.0–100.0)
MONOS PCT: 4 %
Monocytes Absolute: 1.7 10*3/uL — ABNORMAL HIGH (ref 0.2–1.0)
NEUTROS ABS: 34.7 10*3/uL — AB (ref 1.4–6.5)
NEUTROS PCT: 95 %
Platelets: 283 10*3/uL (ref 150–440)
RBC: 5.09 MIL/uL (ref 4.40–5.90)
RDW: 16.1 % — ABNORMAL HIGH (ref 11.5–14.5)
WBC: 37.1 10*3/uL — ABNORMAL HIGH (ref 3.8–10.6)

## 2016-08-29 LAB — BASIC METABOLIC PANEL
ANION GAP: 9 (ref 5–15)
BUN: 59 mg/dL — ABNORMAL HIGH (ref 6–20)
CALCIUM: 9.1 mg/dL (ref 8.9–10.3)
CO2: 27 mmol/L (ref 22–32)
Chloride: 95 mmol/L — ABNORMAL LOW (ref 101–111)
Creatinine, Ser: 1.15 mg/dL (ref 0.61–1.24)
GFR, EST NON AFRICAN AMERICAN: 59 mL/min — AB (ref >60)
Glucose, Bld: 192 mg/dL — ABNORMAL HIGH (ref 65–99)
POTASSIUM: 4.9 mmol/L (ref 3.5–5.1)
SODIUM: 131 mmol/L — AB (ref 135–145)

## 2016-08-29 LAB — GLUCOSE, CAPILLARY
GLUCOSE-CAPILLARY: 126 mg/dL — AB (ref 65–99)
Glucose-Capillary: 204 mg/dL — ABNORMAL HIGH (ref 65–99)
Glucose-Capillary: 207 mg/dL — ABNORMAL HIGH (ref 65–99)
Glucose-Capillary: 122 mg/dL — ABNORMAL HIGH (ref 65–99)

## 2016-08-29 MED ORDER — IPRATROPIUM-ALBUTEROL 0.5-2.5 (3) MG/3ML IN SOLN: 3.0000 mL | RESPIRATORY_TRACT | Status: DC | PRN

## 2016-08-29 MED ORDER — SODIUM CHLORIDE 0.9 % IV SOLN
INTRAVENOUS | Status: AC
  Administered 2016-08-29: 08:00:00 via INTRAVENOUS

## 2016-08-29 MED ORDER — ENOXAPARIN SODIUM 40 MG/0.4ML ~~LOC~~ SOLN
40.0000 mg | SUBCUTANEOUS | Status: DC
  Administered 2016-08-30: 40 mg via SUBCUTANEOUS
  Filled 2016-08-29: qty 0.4

## 2016-08-29 MED ORDER — SODIUM CHLORIDE 0.9% FLUSH
3.0000 mL | Freq: Two times a day (BID) | INTRAVENOUS | Status: DC
  Administered 2016-08-29 – 2016-08-30 (×2): 3 mL via INTRAVENOUS

## 2016-08-29 MED ORDER — POLYETHYLENE GLYCOL 3350 17 G PO PACK
17.0000 g | PACK | Freq: Two times a day (BID) | ORAL | Status: DC
  Administered 2016-08-29 – 2016-08-30 (×2): 17 g via ORAL
  Filled 2016-08-29 (×2): qty 1

## 2016-08-29 NOTE — Progress Notes (Signed)
Pt. Has been refusing the nebulizer treatments. He refused again this morning. He has a severity score of 8 on the RT protocol assessment. I explained to him the treatments could be changed to prn. He agreed with this and will call if he felt SOB.

## 2016-08-29 NOTE — Progress Notes (Signed)
[place] at [place] NAME: Russell Howard    MR#:  [id number]  DATE OF BIRTH:  27-Aug-1934    CHIEF COMPLAINT:   Chief Complaint  Patient presents with  . Respiratory Distress   Abdominal pain and nausea are improved. Chronic shortness of breath and cough for the same.  In and out catheter for urine  REVIEW OF SYSTEMS:   Review of Systems  Respiratory: Negative for cough and shortness of breath.    CONSTITUTIONAL:  EYES: No blurred or double vision.  EARS, NOSE, AND THROAT: No tinnitus or ear pain.  RESPIRATORY:SOBbetter today.   CARDIOVASCULAR: No chest pain, orthopnea, edema.  GASTROINTESTINAL: No nausea, vomiting, diarrhea or abdominal pain.  GENITOURINARY: No dysuria, hematuria.  ENDOCRINE: No polyuria, nocturia,  HEMATOLOGY: No anemia, easy bruising or bleeding SKIN: No rash or lesion. MUSCULOSKELETAL: No joint pain or arthritis.   NEUROLOGIC: No tingling, numbness, weakness.  PSYCHIATRY: No anxiety or depression.   DRUG ALLERGIES:   Allergies  Allergen Reactions  . Penicillin G Hives    Has patient had a PCN reaction causing immediate rash, facial/tongue/throat swelling, SOB or lightheadedness with hypotension: Yes Has patient had a PCN reaction causing severe rash involving mucus membranes or skin necrosis: No Has patient had a PCN reaction that required hospitalization No Has patient had a PCN reaction occurring within the last 10 years: No If all of the above answers are "NO", then may proceed with Cephalosporin use.  . Sulfa Antibiotics Rash    VITALS:  Blood pressure (!) 107/56, pulse 69, temperature 97.7 F (36.5 C), temperature source Oral, resp. rate 18, height 5\' 5"  (1.651 m), weight 77.2 kg (170 lb 4.8 oz), SpO2 97 %.  PHYSICAL EXAMINATION:  GENERAL:  82 y.o.-year-old patient lying in the bed. EYES: Pupils equal, round, reactive to light and accommodation. No scleral icterus. Extraocular muscles  intact.  HEENT: Head atraumatic, normocephalic. Oropharynx and nasopharynx clear.  NECK:  Supple, no jugular venous distention. No thyroid enlargement, no tenderness.  LUNGS:  Mostly clear to auscultation. CARDIOVASCULAR: S1, S2 normal. No murmurs, rubs, or gallops.  ABDOMEN: Soft, , nondistended. Bowel sounds present. No organomegaly or mass. Lower abdominal tenderness EXTREMITIES: No pedal edema, cyanosis, or clubbing.  NEUROLOGIC: Cranial nerves II through XII are intact. Muscle strength 5/5 in all extremities. Sensation intact. Gait not checked.  PSYCHIATRIC: The patient is alert and oriented x 3.  SKIN: No obvious rash, lesion, or ulcer.    LABORATORY PANEL:   CBC  Recent Labs Lab 08/29/16 0612  WBC 37.1*  HGB 15.2  HCT 45.7  PLT 283   ------------------------------------------------------------------------------------------------------------------  Chemistries   Recent Labs Lab 08/28/16 0455 08/29/16 0612  NA 131* 131*  K 5.3* 4.9  CL 96* 95*  CO2 26 27  GLUCOSE 164* 192*  BUN 64* 59*  CREATININE 1.04 1.15  CALCIUM 9.6 9.1  MG 2.1  --    ------------------------------------------------------------------------------------------------------------------  Cardiac Enzymes No results for input(s): TROPONINI in the last 168 hours. ------------------------------------------------------------------------------------------------------------------  RADIOLOGY:  Ct Abdomen Pelvis W Contrast  Result Date: 08/28/2016 CLINICAL DATA:  Abdominal pain EXAM: CT ABDOMEN AND PELVIS WITH CONTRAST TECHNIQUE: Multidetector CT imaging of the abdomen and pelvis was performed using the standard protocol following bolus administration of intravenous contrast. CONTRAST:  121mL ISOVUE-300 IOPAMIDOL (ISOVUE-300) INJECTION 61% COMPARISON:  01/13/2016 FINDINGS: Lower chest: Chronic fibrotic changes are noted in the bases bilaterally. Stable right-sided pleural effusion is seen with some  peripheral pleural  enhancement consistent with an empyema. Hepatobiliary: The liver is within normal limits. The gallbladder is well distended with dependent gallstones. No biliary ductal dilatation is seen. Pancreas: Unremarkable. No pancreatic ductal dilatation or surrounding inflammatory changes. Spleen: Normal in size without focal abnormality. Adrenals/Urinary Tract: The adrenal glands are within normal limits. Bilateral renal cystic change is seen. The overall appearance is stable from the prior exam. No renal calculi or obstructive changes are seen. The bladder is well distended with stable wall thickening. Stomach/Bowel: Retained fecal material is noted consistent with a degree of constipation. Mild diverticular change is seen without diverticulitis. No obstructive changes are seen. No inflammatory changes are noted. The appendix is not visualize consistent with prior surgical history. Moderate hiatal hernia is noted. Vascular/Lymphatic: Aortic atherosclerosis. No enlarged abdominal or pelvic lymph nodes. Reproductive: Prostate is unremarkable. Other: No abdominal wall hernia or abnormality. No abdominopelvic ascites. Musculoskeletal: Degenerative changes of lumbar spine are noted. IMPRESSION: Changes consistent right-sided empyema. Cholelithiasis without complicating factors. Changes of constipation. Chronic changes described above. Electronically Signed   By: Inez Catalina M.D.   On: 08/28/2016 22:42    EKG:   Orders placed or performed during the hospital encounter of 08/19/16  . ED EKG 12-Lead  . ED EKG 12-Lead  . EKG 12-Lead  . EKG 12-Lead  . EKG    ASSESSMENT AND PLAN:   # orthostatic hypotension Chronic but now worsening due to dehydration IV fluids. Continue midodrine.  # Constipation.  Had large bowel movement overnight.Burnis Medin continue MiraLAX for 2 more days.  # Acute respiratory failure secondary to bilateral pneumonia, acute on chronic systolic heart failure Resolved   Patient is on 2 L of oxygen at home S/p post thoracocentesis, about 400 mL of purulent fluid drained from the right lung.cultures from pleural fluid are negative. Was on cefepime. changed to Levaquin   # Right pleural effusion concerning for empyema. With rising white count with 37,000 patient will need a chest tube. Discussed with Dr. Ashby Dawes of pulmonary. Discussed with patient with his daughter-in-law at bedside. He does not want a chest tube at this time. Would like to try antibiotics. Plan is to see how patient does in the next 1-2 days. If there is no improvement patient will be discharged home with hospice services to be transitioned to comfort measures when any worsening.  # Chronic systolic heart failure with unknown baseline History of AICD placement history of chronic atrial fibrillation. Previous echo showed EF 45%. Patient sees Dr. Ubaldo Glassing from [facility] cardiology    # COPD exacerbation: improving;Continue oxygen, bronchodilators. leukocytosis due to steroids. Now he is off the steroids.  # Bilateral pleural effusions, history of lung cancer. s/p thoracocentesis    # diabetes mellitus type 2. Hb a1 c 7.4 poor mouth intake. Continue sliding scale insulin coverage  Po intake slighltly better.  Does not want to go to rehabilitation.  D/w daughter-in-law  Management plans discussed with the patient, family and they are in agreement.  CODE STATUS: DNR  TOTAL TIME TAKING CARE OF THIS PATIENT: 35 minutes.   Hillary Bow R M.D on 08/29/2016 at 1:37 PM  Between 7am to 6pm - Pager - [phone]  After 6pm go to [email or web address] - password [email or web address] [place] [facility]  Office  [phone]  CC: Primary care physician; [name], MD   Note: This dictation was prepared with Dragon dictation along with smaller phrase technology. Any transcriptional errors that result from this process are unintentional.

## 2016-08-29 NOTE — Progress Notes (Addendum)
Daily Progress Note   Patient Name: Russell Howard       Date: 08/29/2016 DOB: 02-26-1934  Age: 82 y.o. MRN#: [id number] Attending Physician: Russell Bow, MD Primary Care Physician: Russell Crouch, MD Admit Date: 08/19/2016  Reason for Consultation/Follow-up: Establishing goals of care  Subjective/GOC: 1005: Visited with the patient this AM. He feels "so weak it hurts." Poor appetite. Was able to have a large BM yesterday. Daughter, Russell Howard at bedside. Discussed worsening WBC's and CT results. Waiting for options from attending. She states "we need to treat this aggressively with IV antibiotics." Discussed he is currently on antibiotics but this often requires a chest tube and may not respond to antibiotics. We also talked about underlying heart and lung disease contributing to continued weakness and deconditioning. Patient again states "I'm tired."   1400: Follow up with Russell Howard this afternoon via telephone. She has spoken with pulmonary and attending. [name] does not want chest tube placement. Watchful waiting with antibiotics. Russell Howard and her dad had "a serious conversation earlier." He again spoke of how tired he was and ready to go (die). Russell Howard becomes tearful but speaks of their strong faith and thankful for "many good years together." It has remained important for him to return home. She seems realistic with poor prognosis and the need for hospice services on discharge understanding the support hospice will be at home at the end of his life.   Answered questions and concerns. Emotional and spiritual support provided.   Length of Stay: 10  Current Medications: Scheduled Meds:  . acidophilus  1 capsule Oral Daily  . aspirin EC  81 mg Oral Daily  . cholecalciferol  5,000 Units Oral Daily  .  cyanocobalamin  500 mcg Oral Daily  . digoxin  0.25 mg Oral Daily  . [START ON 08/30/2016] enoxaparin (LOVENOX) injection  40 mg Subcutaneous Q24H  . gabapentin  300 mg Oral BID  . insulin aspart  0-15 Units Subcutaneous TID WC  . insulin aspart  0-5 Units Subcutaneous QHS  . levofloxacin  500 mg Oral Daily  . levothyroxine  50 mcg Oral QAC breakfast  . magnesium oxide  400 mg Oral Daily  . methenamine  1,000 mg Oral QID  . midodrine  10 mg Oral TID WC  . multivitamin with minerals  1 tablet Oral  Daily  . pantoprazole  40 mg Oral QAC breakfast  . polyethylene glycol  17 g Oral BID  . saccharomyces boulardii  250 mg Oral BID  . simvastatin  20 mg Oral q1800  . vitamin C  500 mg Oral TID  . vitamin E  400 Units Oral Daily    Continuous Infusions:   PRN Meds: acetaminophen **OR** acetaminophen, calcium carbonate, guaiFENesin-dextromethorphan, ipratropium-albuterol, meclizine, ondansetron **OR** ondansetron (ZOFRAN) IV, phenol  Physical Exam  Constitutional: He is oriented to person, place, and time. He is cooperative. He appears ill.  HENT:  Head: Normocephalic and atraumatic.  Cardiovascular: Regular rhythm.   Pulmonary/Chest: No accessory muscle usage. No tachypnea. No respiratory distress. He has decreased breath sounds.  Intermittent dyspnea at rest  Abdominal: Normal appearance.  Neurological: He is alert and oriented to person, place, and time.  Skin: Skin is warm and dry. There is pallor.  Psychiatric: His speech is delayed. He is withdrawn. Cognition and memory are normal.  Nursing note and vitals reviewed.          Vital Signs: BP (!) 107/56 (BP Location: Right Arm)   Pulse 69   Temp 97.7 F (36.5 C) (Oral)   Resp 18   Ht 5\' 5"  (1.651 m)   Wt 77.2 kg (170 lb 4.8 oz)   SpO2 97%   BMI 28.34 kg/m  SpO2: SpO2: 97 % O2 Device: O2 Device: Not Delivered O2 Flow Rate: O2 Flow Rate (L/min): 2 L/min  Intake/output summary:   Intake/Output Summary (Last 24 hours) at  08/29/16 1720 Last data filed at 08/29/16 1338  Gross per 24 hour  Intake                0 ml  Output             1975 ml  Net            -1975 ml   LBM: Last BM Date: 08/29/16 Baseline Weight: Weight: 81.6 kg (180 lb) Most recent weight: Weight: 77.2 kg (170 lb 4.8 oz)       Palliative Assessment/Data: PPS 30%   Flowsheet Rows     Most Recent Value  Intake Tab  Referral Department  Hospitalist  Unit at Time of Referral  Cardiac/Telemetry Unit  Palliative Care Primary Diagnosis  Other (Comment) [CHF, pleural effusions, lung CA]  Date Notified  08/25/16  Palliative Care Type  Return patient Palliative Care  Reason for referral  Clarify Goals of Care  Date of Admission  08/19/16  Date first seen by Palliative Care  08/26/16  # of days Palliative referral response time  1 Day(s)  # of days IP prior to Palliative referral  6  Clinical Assessment  Palliative Performance Scale Score  30%  Psychosocial & Spiritual Assessment  Palliative Care Outcomes  Patient/Family meeting held?  Yes  Who was at the meeting?  patient and daughter  Palliative Care Outcomes  Clarified goals of care, Provided end of life care assistance, Provided psychosocial or spiritual support, Counseled regarding hospice      Patient Active Problem List   Diagnosis Date Noted  . Weakness   . Community acquired pneumonia   . Pleural effusion   . Palliative care by specialist   . Acute on chronic respiratory failure with hypoxia ([place]) 08/19/2016  . Acute on chronic respiratory failure ([place]) 08/19/2016  . Septic shock ([place]) 03/02/2016  . Hydronephrosis, right 01/14/2016  . Constipation 01/13/2016  . Urinary retention 01/13/2016  .  Lower abdominal pain 01/13/2016  . Bacteremia 01/13/2016  . Hypotension 01/13/2016  . Palliative care encounter   . Goals of care, counseling/discussion   . Cough   . Encounter for hospice care discussion   . Hypoxia 01/09/2016  . Primary cancer of bronchus of right lower  lobe ([place]) 11/29/2015  . Sepsis ([place]) 06/14/2015  . Lower urinary tract infectious disease 06/14/2015    Class: Acute  . H/O malignant neoplasm 11/09/2014  . Acquired hypothyroidism 11/02/2014  . Cardiomyopathy ([place]) 11/02/2014  . Acute on chronic diastolic CHF (congestive heart failure) ([place]) 11/02/2014  . Diabetes mellitus without complication ([place]) 84/16/6063  . Cardiac defibrillator in place 11/02/2014  . Gastro-esophageal reflux disease without esophagitis 11/02/2014  . History of atrial fibrillation 11/02/2014  . BP (high blood pressure) 11/02/2014  . Neuropathy 11/02/2014  . Orthostasis 11/02/2014  . Pure hypercholesterolemia 11/02/2014  . Heart valve disease 11/02/2014    Palliative Care Assessment & Plan   Patient Profile: 82 y.o. male  with past medical history of chronic diastolic heart failure, cardiomyopathy, defibrillator, atrial fibrillation, lung cancer, fibrosis, recurrent pleural effusions, hypothyroidism, neuropathy, GERD, and BPH admitted on 08/19/2016 with shortness of breath, cough, and weakness. In ED, patient with acute respiratory failure secondary to bilateral pneumonia, acute on chronic systolic heart failure, and pleural effusions. Did not tolerate BiPAP on admit. S/p thoracentesis right lung with 400cc drained. Also with COPD exacerbation and receiving oxygen, IV steroids, and bronchodilators. Hypotension and tachycardia likely due to dehydration. Palliative medicine consultation for goals of care.   Assessment: Acute respiratory failure  Bilateral pneumonia Acute on chronic systolic heart failure COPD exacerbation Bilateral pleural effusions s/p thoracentesis Empyema Orthostatic hypotension Hx of lung cancer  Recommendations/Plan:  DNR/DNI  Worsening clinical condition. Now with empyema. Patient has declined chest tube.   Watchful waiting through the weekend. Likely home with hospice. Patient wants to be home at EOL.   PMT not at [facility] through the  weekend but will f/u Monday to further support and determine discharge plan.   Code Status: DNR   Code Status Orders        Start     Ordered   08/19/16 1316  Do not attempt resuscitation (DNR)  Continuous    Question Answer Comment  In the event of cardiac or respiratory ARREST Do not call a "code blue"   In the event of cardiac or respiratory ARREST Do not perform Intubation, CPR, defibrillation or ACLS   In the event of cardiac or respiratory ARREST Use medication by any route, position, wound care, and other measures to relive pain and suffering. May use oxygen, suction and manual treatment of airway obstruction as needed for comfort.      08/19/16 1315    Code Status History    Date Active Date Inactive Code Status Order ID Comments User Context   03/02/2016 10:36 PM 03/05/2016  8:20 PM DNR [id number]  Mikael Spray, NP ED   01/09/2016  6:58 AM 01/14/2016  7:39 PM DNR [id number]  Harrie Foreman, MD Inpatient   11/09/2015  3:18 AM 11/12/2015  5:13 PM DNR [id number]  Harrie Foreman, MD Inpatient   06/14/2015  4:39 PM 06/18/2015  3:13 PM DNR [id number]  Demetrios Loll, MD Inpatient    Advance Directive Documentation     Most Recent Value  Type of Advance Directive  Living will  Pre-existing out of facility DNR order (yellow form or pink MOST form)  -  "MOST" Form in Place?  -  Prognosis:   Unable to determine: guarded with pneumonia, underlying COPD and acute on chronic CHF,decreased functional and nutritional status, and high risk for recurrent hospitalization. Now with empyema declining chest tube.  Discharge Planning:  To Be Determined likely home with hospice services   Care plan was discussed with patient, daughter, RN, and Dr. Darvin Neighbours  Thank you for allowing the Palliative Medicine Team to assist in the care of this patient.   Time In: 1400 Time Out: 1435 Total Time 74min Prolonged Time Billed  no      Greater than 50%  of this time was spent counseling  and coordinating care related to the above assessment and plan.  Ihor Dow, FNP-C Palliative Medicine Team  Phone: [phone] Fax: [phone]  Please contact Palliative Medicine Team phone at [phone] for questions and concerns.

## 2016-08-29 NOTE — Care Management (Signed)
Patient status declining .  Now with elevated WBC  up to 37.  Pulmonary consult.  Patient is declining chest tube for probable empyema.  Try changing antibiotics.  If condition does not improve, patient would like to discharge home  with home hospice services.  Spoke with CV navigator and asked that heart failure clinic appointment be canceled and not to attempt to schedule another at this time

## 2016-08-29 NOTE — Progress Notes (Signed)
* [facility]     Assessment and Plan:  IMPRESSION:   1) acute on chronic hypoxemic respiratory failure 2) history of lung cancer, status post XRT 3) extensive radiation fibrosis of right lung and chronic right pleural effusion, possible empyema.  4) the acute illness might represent an acute pulmonary infection  His right lung is essentially obliterated with radiation fibrosis and pleural effusion. I personally reviewed images, CTAP shows recurrent right pleural effusion with severe fibrotic changes. These changes in the lung go back at least one year. He was seen by Dr. Genevive Bi in 2017 for this effusion, and possible chest tube was discussed with pt and family to treat possible empyema. At that time they decided to manage conservatively with abx and declined chest tube.    PLAN:  Again discussed options with pt, family, hospitalist Dr. Darvin Neighbours. He has been through quite a lot and they are considering transition to hospice. They do not want a chest tube at this time, therefore we agree we will treat conservatively with abx. If his condition declines he may be a candidate for transition to hospice, but we will avoid aggressive measures for now.  --Continue abx.     Date: 08/29/2016  MRN# [id number] Russell Howard [birth year]   Russell Howard is a 82 y.o. old male seen in follow up for chief complaint of  Chief Complaint  Patient presents with  . Respiratory Distress   Subjective:  Pt awake, alert, appears fatigued, has some mild lower abdominal pain.   Medication:    Current Facility-Administered Medications:  .  0.9 %  sodium chloride infusion, , Intravenous, Continuous, Sudini, Srikar, MD, Last Rate: 150 mL/hr at 08/29/16 0811 .  acetaminophen (TYLENOL) tablet 650 mg, 650 mg, Oral, Q6H PRN, 650 mg at 08/29/16 0944 **OR** acetaminophen (TYLENOL) suppository 650 mg, 650 mg, Rectal, Q6H PRN, Harrie Foreman, MD .  acidophilus (RISAQUAD) capsule 1 capsule, 1 capsule,  Oral, Daily, Harrie Foreman, MD, 1 capsule at 08/29/16 [phone] .  aspirin EC tablet 81 mg, 81 mg, Oral, Daily, Harrie Foreman, MD, 81 mg at 08/29/16 0943 .  calcium carbonate (TUMS - dosed in mg elemental calcium) chewable tablet 400 mg of elemental calcium, 2 tablet, Oral, BID PRN, Basilio Cairo, NP .  cholecalciferol (VITAMIN D) tablet 5,000 Units, 5,000 Units, Oral, Daily, Demetrios Loll, MD, 5,000 Units at 08/29/16 [phone] .  cyanocobalamin tablet 500 mcg, 500 mcg, Oral, Daily, Harrie Foreman, MD, 500 mcg at 08/29/16 [phone] .  digoxin (LANOXIN) tablet 0.25 mg, 0.25 mg, Oral, Daily, Sudini, Srikar, MD, 0.25 mg at 08/29/16 0943 .  [START ON 08/30/2016] enoxaparin (LOVENOX) injection 40 mg, 40 mg, Subcutaneous, Q24H, Sudini, Srikar, MD .  gabapentin (NEURONTIN) tablet 300 mg, 300 mg, Oral, BID, Epifanio Lesches, MD, 300 mg at 08/29/16 0943 .  guaiFENesin-dextromethorphan (ROBITUSSIN DM) 100-10 MG/5ML syrup 5 mL, 5 mL, Oral, Q4H PRN, Demetrios Loll, MD, 5 mL at 08/20/16 0743 .  insulin aspart (novoLOG) injection 0-15 Units, 0-15 Units, Subcutaneous, TID WC, Epifanio Lesches, MD, 5 Units at 08/29/16 1307 .  insulin aspart (novoLOG) injection 0-5 Units, 0-5 Units, Subcutaneous, QHS, Epifanio Lesches, MD, 4 Units at 08/23/16 2141 .  ipratropium-albuterol (DUONEB) 0.5-2.5 (3) MG/3ML nebulizer solution 3 mL, 3 mL, Nebulization, Q4H PRN, Sudini, Srikar, MD .  levofloxacin (LEVAQUIN) tablet 500 mg, 500 mg, Oral, Daily, Sudini, Srikar, MD, 500 mg at 08/29/16 0942 .  levothyroxine (SYNTHROID, LEVOTHROID) tablet 50 mcg, 50 mcg, Oral, QAC breakfast,  Harrie Foreman, MD, 50 mcg at 08/29/16 5027 .  magnesium oxide (MAG-OX) tablet 400 mg, 400 mg, Oral, Daily, Harrie Foreman, MD, 400 mg at 08/29/16 0942 .  meclizine (ANTIVERT) tablet 25 mg, 25 mg, Oral, Q6H PRN, Ether Griffins, Rima, MD .  methenamine (MANDELAMINE) tablet 1,000 mg, 1,000 mg, Oral, QID, Epifanio Lesches, MD, 1,000 mg at 08/29/16 1308 .   midodrine (PROAMATINE) tablet 10 mg, 10 mg, Oral, TID WC, Sudini, Srikar, MD, 10 mg at 08/29/16 1308 .  multivitamin with minerals tablet 1 tablet, 1 tablet, Oral, Daily, Harrie Foreman, MD, 1 tablet at 08/29/16 [phone] .  ondansetron (ZOFRAN) tablet 4 mg, 4 mg, Oral, Q6H PRN, 4 mg at 08/27/16 1906 **OR** ondansetron (ZOFRAN) injection 4 mg, 4 mg, Intravenous, Q6H PRN, Harrie Foreman, MD, 4 mg at 08/29/16 0103 .  pantoprazole (PROTONIX) EC tablet 40 mg, 40 mg, Oral, QAC breakfast, Harrie Foreman, MD, 40 mg at 08/29/16 0803 .  phenol (CHLORASEPTIC) mouth spray 1 spray, 1 spray, Mouth/Throat, PRN, Epifanio Lesches, MD, 1 spray at 08/26/16 1920 .  saccharomyces boulardii (FLORASTOR) capsule 250 mg, 250 mg, Oral, BID, Sudini, Srikar, MD, 250 mg at 08/29/16 0942 .  simvastatin (ZOCOR) tablet 20 mg, 20 mg, Oral, q1800, Harrie Foreman, MD, 20 mg at 08/28/16 1707 .  vitamin C (ASCORBIC ACID) tablet 500 mg, 500 mg, Oral, TID, Harrie Foreman, MD, 500 mg at 08/29/16 8786 .  vitamin E capsule 400 Units, 400 Units, Oral, Daily, Harrie Foreman, MD, 400 Units at 08/29/16 7672   Allergies:  Penicillin g and Sulfa antibiotics  Review of Systems: Gen:  Denies  fever, sweats. HEENT: Denies blurred vision. Cvc:  No dizziness, chest pain or heaviness Resp:   Denies cough or sputum porduction. Gi: Denies swallowing difficulty, stomach pain. constipation, bowel incontinence Gu:  Denies bladder incontinence, burning urine Ext:   No Joint pain, stiffness. Skin: No skin rash, easy bruising. Endoc:  No polyuria, polydipsia. Psych: No depression, insomnia. Other:  All other systems were reviewed and found to be negative other than what is mentioned in the HPI.   Physical Examination:   VS: BP (!) 107/56 (BP Location: Right Arm)   Pulse 69   Temp 97.7 F (36.5 C) (Oral)   Resp 18   Ht 5\' 5"  (1.651 m)   Wt 170 lb 4.8 oz (77.2 kg)   SpO2 97%   BMI 28.34 kg/m   General Appearance: No  distress  Neuro:without focal findings,  speech normal,  HEENT: PERRLA, EOM intact. Pulmonary: decreased air entry right lung.   CardiovascularNormal S1,S2.  No m/r/g.   Abdomen: Benign, Soft, non-tender. Renal:  No costovertebral tenderness  GU:  Not performed at this time. Endoc: No evident thyromegaly, no signs of acromegaly. Skin:   warm, no rash. Extremities: normal, no cyanosis, clubbing.   LABORATORY PANEL:   CBC  Recent Labs Lab 08/29/16 0612  WBC 37.1*  HGB 15.2  HCT 45.7  PLT 283   ------------------------------------------------------------------------------------------------------------------  Chemistries   Recent Labs Lab 08/28/16 0455 08/29/16 0612  NA 131* 131*  K 5.3* 4.9  CL 96* 95*  CO2 26 27  GLUCOSE 164* 192*  BUN 64* 59*  CREATININE 1.04 1.15  CALCIUM 9.6 9.1  MG 2.1  --    ------------------------------------------------------------------------------------------------------------------  Cardiac Enzymes No results for input(s): TROPONINI in the last 168 hours. ------------------------------------------------------------  RADIOLOGY:   No results found for this or any previous visit. Results for orders placed  during the hospital encounter of 06/12/16  DG Chest 2 View   Narrative CLINICAL DATA:  Pulmonary fibrosis.  EXAM: CHEST  2 VIEW  COMPARISON:  Radiograph of March 02, 2016.  FINDINGS: Stable cardiomediastinal silhouette. Status post aortic valve replacement. No pneumothorax is noted. Left-sided pacemaker is unchanged in position. Stable right apical and pleural thickening is noted. Stable interstitial densities are noted in the remaining portion of the right lung concerning for edema, inflammation or possibly fibrosis. Stable interstitial densities are noted throughout the left lung consistent with scarring or fibrosis. No pneumothorax is noted.  IMPRESSION: Stable bilateral lung findings are noted consistent with  pulmonary fibrosis, although superimposed edema or inflammation in the right lung cannot be excluded. Stable right apical and pleural thickening is noted. No significant change compared to prior exam.   Electronically Signed   By: Marijo Conception, M.D.   On: 06/12/2016 13:16    ------------------------------------------------------------------------------------------------------------------  Thank  you for allowing [facility] to assist in the care of your patient. Our recommendations are noted above.  Please contact us if we can be of further service.   Marda Stalker, MD.  [facility] Office Number: [phone]  Patricia Pesa, M.D.  Merton Border, M.D  08/29/2016

## 2016-08-29 NOTE — Progress Notes (Signed)
[facility] care planning  * Empyema/worsening physical status After discussing with pulmonary and reviewing CT scan returned the patient's room. Discussed with patient and daughter-in-law at bedside regarding findings on CT scan with possible empyema. Suggested that treatment will need a chest tube. Patient does not want a chest tube. Is open to trying IV antibiotics. He feels like he has gone through a lot through his cancer and now recurrent pleural effusion. He does not want to go to rehabilitation. Once to go home. He wants to try the antibiotics and if no improvement once we discharged home. Later transitioned to comfort measures if any worsening. DNR  Time spent 20 minutes

## 2016-08-29 NOTE — Progress Notes (Signed)
Patient complaining of nausea. Medication given. Enema given. Resulted with extra large solid bowel movement. Patient still complaining of not feeling well but just wanting to try to rest. Will monitor.

## 2016-08-29 NOTE — Progress Notes (Signed)
Pt does not feel as if he can take his night time medications due to his stomach being so upset, pt  States " I just want to have a bowel movement" .

## 2016-08-30 LAB — CBC WITH DIFFERENTIAL/PLATELET
Basophils Absolute: 0 10*3/uL (ref 0–0.1)
Basophils Relative: 0 %
EOS PCT: 2 %
Eosinophils Absolute: 0.3 10*3/uL (ref 0–0.7)
HCT: 42.2 % (ref 40.0–52.0)
Hemoglobin: 13.9 g/dL (ref 13.0–18.0)
LYMPHS ABS: 1 10*3/uL (ref 1.0–3.6)
LYMPHS PCT: 6 %
MCH: 29.4 pg (ref 26.0–34.0)
MCHC: 33 g/dL (ref 32.0–36.0)
MCV: 89.1 fL (ref 80.0–100.0)
MONO ABS: 1.1 10*3/uL — AB (ref 0.2–1.0)
MONOS PCT: 6 %
Neutro Abs: 14.8 10*3/uL — ABNORMAL HIGH (ref 1.4–6.5)
Neutrophils Relative %: 86 %
PLATELETS: 235 10*3/uL (ref 150–440)
RBC: 4.74 MIL/uL (ref 4.40–5.90)
RDW: 16.2 % — ABNORMAL HIGH (ref 11.5–14.5)
WBC: 17.2 10*3/uL — ABNORMAL HIGH (ref 3.8–10.6)

## 2016-08-30 LAB — BASIC METABOLIC PANEL
Anion gap: 6 (ref 5–15)
BUN: 55 mg/dL — AB (ref 6–20)
CHLORIDE: 99 mmol/L — AB (ref 101–111)
CO2: 28 mmol/L (ref 22–32)
Calcium: 8.6 mg/dL — ABNORMAL LOW (ref 8.9–10.3)
Creatinine, Ser: 1.03 mg/dL (ref 0.61–1.24)
GFR calc Af Amer: >60 mL/min (ref >60)
GFR calc non Af Amer: >60 mL/min (ref >60)
GLUCOSE: 131 mg/dL — AB (ref 65–99)
POTASSIUM: 4.6 mmol/L (ref 3.5–5.1)
Sodium: 133 mmol/L — ABNORMAL LOW (ref 135–145)

## 2016-08-30 LAB — GLUCOSE, CAPILLARY
GLUCOSE-CAPILLARY: 127 mg/dL — AB (ref 65–99)
Glucose-Capillary: 139 mg/dL — ABNORMAL HIGH (ref 65–99)
Glucose-Capillary: 191 mg/dL — ABNORMAL HIGH (ref 65–99)

## 2016-08-30 MED ORDER — DOCUSATE SODIUM 100 MG PO CAPS: 100.0000 mg | ORAL_CAPSULE | Freq: Two times a day (BID) | ORAL | 0 refills | Status: DC

## 2016-08-30 MED ORDER — ONDANSETRON 4 MG PO TBDP: 4.0000 mg | ORAL_TABLET | Freq: Three times a day (TID) | ORAL | 0 refills | Status: DC | PRN

## 2016-08-30 MED ORDER — MIDODRINE HCL 10 MG PO TABS: 10.0000 mg | ORAL_TABLET | Freq: Three times a day (TID) | ORAL | 0 refills | Status: DC

## 2016-08-30 MED ORDER — DIGOX 250 MCG PO TABS: 250.0000 ug | ORAL_TABLET | Freq: Every day | ORAL | Status: DC

## 2016-08-30 MED ORDER — LEVOFLOXACIN 500 MG PO TABS: 500.0000 mg | ORAL_TABLET | Freq: Every day | ORAL | 0 refills | Status: DC

## 2016-08-30 NOTE — Progress Notes (Signed)
Patient refusing to be catherized throughout the night, despite education by this RN. Patient voiding smalls amounts in urinal. Patient not complaining of any discomfort at this time. Will continue to monitor.   Update: Patient voided 75 mL, but complained of some discomfort. Bladder scan showed 439 mL in bladder. Intermittent catherization performed by this RN. 350 mL of urine output. Post cath bladder scan showed 64 mL. Patient states that he feels better. Will continue to monitor.   Russell Howard

## 2016-08-30 NOTE — Plan of Care (Signed)
Problem: Health Behavior/Discharge Planning: Goal: Ability to manage health-related needs will improve Outcome: Completed/Met Date Met: 08/30/16 Daughter in law serves as the health care provider.  She is very knowledgable about his meds and his cath schedule.   

## 2016-08-30 NOTE — Progress Notes (Signed)
Discharged to home with daughter and son in law.  [facility] care set up with [facility].  Prescriptions provided.

## 2016-08-30 NOTE — Care Management Note (Addendum)
Case Management Note  Patient Details  Name: Russell Howard MRN: [id number] Date of Birth: September 01, 1934  Subjective/Objective:        Referral for HH-PT, RN, OT, Aide, SW was called to [facility] [employer], [facility]. Per previous conversation with weekday CM, [name] chose [facility]. Oral antibiotic ordered for home, and per his daughter-in-law, [name] already has a nebulizer machine at home for use with Duoneb.            Action/Plan:   Expected Discharge Date:  08/30/16               Expected Discharge Plan:   08/30/16  In-House Referral:  Hospice / Palliative Care  Discharge planning Services  CM Consult, HF Clinic  Post Acute Care Choice:   Yes Choice offered to:   patient  DME Arranged:    DME Agency:     HH Arranged:  Social Work, OT, PT, Therapist, sports, Nurse's Aide (Palliative care) [place] Agency:  [facility]  Status of Service:  Completed, signed off    Completed If discussed at [employer] of Stay Meetings, dates discussed:    Additional Comments:  Preslee Regas A, RN 08/30/2016, 11:35 AM

## 2016-09-01 ENCOUNTER — Telehealth: Payer: Self-pay | Admitting: *Deleted

## 2016-09-01 DIAGNOSIS — J90 Pleural effusion, not elsewhere classified: Secondary | ICD-10-CM

## 2016-09-01 NOTE — Telephone Encounter (Signed)
-----   Message from Wilhelmina Mcardle, MD sent at 08/21/2016  6:29 PM EDT ----- Please schedule follow-up with me in the office in 4-6 weeks with chest x-ray prior to that visit  Thanks  Waunita Schooner

## 2016-09-01 NOTE — Telephone Encounter (Signed)
Informed daughter in law of appt and that pt needs to get CXR prior to appt. Nothing further needed.

## 2016-09-02 ENCOUNTER — Ambulatory Visit: Payer: Medicare Other | Admitting: Family

## 2016-09-03 NOTE — Discharge Summary (Signed)
[facility] NAME: Russell Howard    MR#:  [id number]  DATE OF BIRTH:  12/18/1934  DATE OF ADMISSION:  08/19/2016 ADMITTING PHYSICIAN: Demetrios Loll, MD  DATE OF DISCHARGE: 08/30/2016  2:23 PM  PRIMARY CARE PHYSICIAN: [name], MD   ADMISSION DIAGNOSIS:  Respiratory distress [R06.03] Acute respiratory failure with hypoxia (HCC) [J96.01] Sepsis, due to unspecified organism ([place]) [A41.9] Fever, unspecified fever cause [R50.9] Community acquired pneumonia, unspecified laterality [J18.9] Acute on chronic respiratory failure ([place]) [J96.20]  DISCHARGE DIAGNOSIS:  Active Problems:   Acute on chronic respiratory failure with hypoxia (HCC)   Acute respiratory failure with hypoxia ([place])   Community acquired pneumonia   Pleural effusion   Palliative care by specialist   Weakness   Empyema ([place])   SECONDARY DIAGNOSIS:   Past Medical History:  Diagnosis Date  . Acquired hypothyroidism 11/02/2014  . BPH (benign prostatic hyperplasia)   . Cardiac defibrillator in place 11/02/2014  . Cardiomyopathy ([place])   . Chronic diastolic heart failure ([place]) 11/02/2014  . Gastro-esophageal reflux disease without esophagitis 11/02/2014  . H/O malignant neoplasm 11/09/2014  . Heart valve disease 11/02/2014  . History of atrial fibrillation 11/02/2014  . Lung cancer ([place])   . Neuropathy 11/02/2014  . Orthostasis 11/02/2014  . Pure hypercholesterolemia 11/02/2014  . Valvular heart disease      ADMITTING HISTORY  Chief Complaint: Respiratory distress HPI: The patient with past medical history of diastolic congestive heart failure, lung cancer with recurrent pleural effusion and chronic fibrosis presents emergency department complaining of shaking chills and shortness of breath. His daughter states that he has been coughing as well as had intermittent weakness for one week. The patient states that he awoke shivering and had 1 episode of nonbloody  nonbilious emesis. In the emergency department the patient required BiPAP to improve his work of breathing. However he could not tolerate the mask and eventually had another episode of emesis. He is placed on nonrebreather. Code sepsis was initiated and once blood cultures antibiotics were initiated as well as fluid resuscitation the emergency department staff called the hospitalist service for admission.  HOSPITAL COURSE:   # orthostatic hypotension Chronic but now worsening due to dehydration IV fluids Helped.  Increased dose of midodrine from 5 mg 3 times a day to 10 mg.  # Constipation.  Had large bowel movement overnight. Continue laxatives. Feels much improved.  # Acute respiratory failure secondary to bilateral pneumonia, acute on chronic systolic heart failure Resolved   # Right pleural effusion concerning for empyema.  Had significantly elevated WBC count of 37,000. Consulted pulmonary and Dr. Ashby Dawes and mean discussed with patient and daughter-in-law at bedside. Patient refused a chest tube. At this time he will continue antibiotics. WBC trended down. Although he is at high risk for worsening. After discussing extensively with patient and daughter he will be discharged home with home health with palliative following. If there is any worsening he does not want a chest tube and will need to be transitioned to hospice for comfort measures.  # Chronic systolic heart failure with unknown baseline History of AICD placement history of chronic atrial fibrillation. Previous echo showed EF 45%. Patient sees Dr. Ubaldo Glassing from [facility] cardiology    # COPD exacerbation: improving;Continue oxygen, bronchodilators. leukocytosis due to steroids. Now he is off the steroids.  # Bilateral pleural effusions, history of lung cancer. s/p thoracocentesis - 400 ML   # diabetes mellitus type 2. Hb a1  c 7.4 poor mouth intake.  Patient has gained maximal benefit from the hospital stay and will be  discharged home.  CONSULTS OBTAINED:    DRUG ALLERGIES:   Allergies  Allergen Reactions  . Penicillin G Hives    Has patient had a PCN reaction causing immediate rash, facial/tongue/throat swelling, SOB or lightheadedness with hypotension: Yes Has patient had a PCN reaction causing severe rash involving mucus membranes or skin necrosis: No Has patient had a PCN reaction that required hospitalization No Has patient had a PCN reaction occurring within the last 10 years: No If all of the above answers are "NO", then may proceed with Cephalosporin use.  . Sulfa Antibiotics Rash    DISCHARGE MEDICATIONS:   Discharge Medication List as of 08/30/2016  1:56 PM    START taking these medications   Details  docusate sodium (COLACE) 100 MG capsule Take 1 capsule (100 mg total) by mouth 2 (two) times daily., Starting Sat 08/30/2016, Until Sun 08/30/2017, Normal    guaiFENesin-dextromethorphan (ROBITUSSIN DM) 100-10 MG/5ML syrup Take 5 mLs by mouth every 4 (four) hours as needed for cough., Starting Wed 08/27/2016, Normal    ondansetron (ZOFRAN ODT) 4 MG disintegrating tablet Take 1 tablet (4 mg total) by mouth every 8 (eight) hours as needed for nausea or vomiting., Starting Sat 08/30/2016, Print      CONTINUE these medications which have CHANGED   Details  DIGOX 250 MCG tablet Take 1 tablet (250 mcg total) by mouth daily., Starting Sat 08/30/2016, No Print    levofloxacin (LEVAQUIN) 500 MG tablet Take 1 tablet (500 mg total) by mouth daily., Starting Sat 08/30/2016, Print    midodrine (PROAMATINE) 10 MG tablet Take 1 tablet (10 mg total) by mouth 3 (three) times daily with meals., Starting Sat 08/30/2016, Normal      CONTINUE these medications which have NOT CHANGED   Details  acidophilus (RISAQUAD) CAPS capsule Take 1 capsule by mouth daily., Historical Med    Ascorbic Acid (VITAMIN C) 1000 MG tablet Take 500 mg by mouth 3 (three) times daily. Reported on 06/14/2015, Historical Med     aspirin EC 81 MG tablet Take 81 mg by mouth daily. , Historical Med    Cholecalciferol 5000 units TABS Take 5,000 Units by mouth every morning., Historical Med    Coenzyme Q10 (TH CO Q-10) 100 MG capsule Take 100 mg by mouth daily. , Historical Med    furosemide (LASIX) 20 MG tablet Take 20 mg by mouth every other day as needed (as needed for water retention). , Starting Thu 05/10/2015, Historical Med    gabapentin (NEURONTIN) 600 MG tablet Take 600 mg by mouth 2 (two) times daily. , Historical Med    hydrocortisone (CORTEF) 10 MG tablet Take 10 mg by mouth 2 (two) times daily. , Historical Med    ipratropium-albuterol (DUONEB) 0.5-2.5 (3) MG/3ML SOLN Take 3 mLs by nebulization every 6 (six) hours as needed., Starting Thu 06/12/2016, Normal    levothyroxine (SYNTHROID, LEVOTHROID) 50 MCG tablet Take 50 mcg by mouth daily before breakfast., Historical Med    MAGNESIUM-OXIDE 400 (241.3 Mg) MG tablet Take 400 mg by mouth daily., Historical Med    methenamine (HIPREX) 1 g tablet TAKE 1 TABLET BY MOUTH TWICE DAILY WITH MEALS, Normal    Multiple Vitamin (MULTIVITAMIN WITH MINERALS) TABS tablet Take 1 tablet by mouth daily., Historical Med    pantoprazole (PROTONIX) 40 MG tablet Take 40 mg by mouth daily before breakfast. , Historical Med  potassium chloride (MICRO-K) 10 MEQ CR capsule Take 10 mEq by mouth daily as needed (only with lasix). , Historical Med    SAW PALMETTO-PUMPKIN SEED OIL PO Take 1 capsule by mouth daily. , Historical Med    simvastatin (ZOCOR) 20 MG tablet Take 20 mg by mouth daily at 6 PM. , Historical Med    vitamin B-12 (CYANOCOBALAMIN) 500 MCG tablet Take 500 mcg by mouth daily. , Historical Med    vitamin E 400 UNIT capsule Take 400 Units by mouth daily. , Historical Med        Today   VITAL SIGNS:  Blood pressure (!) 114/52, pulse 70, temperature 98 F (36.7 C), resp. rate 18, height 5\' 5"  (1.651 m), weight 80.4 kg (177 lb 3.2 oz), SpO2 97 %.  I/O:  No  intake or output data in the 24 hours ending 09/03/16 1305  PHYSICAL EXAMINATION:  Physical Exam  GENERAL:  82 y.o.-year-old patient lying in the bed with no acute distress.  LUNGS: Normal breath sounds bilaterally, no wheezing, rales,rhonchi or crepitation. No use of accessory muscles of respiration.  CARDIOVASCULAR: S1, S2 normal. No murmurs, rubs, or gallops.  ABDOMEN: Soft, non-tender, non-distended. Bowel sounds present. No organomegaly or mass.  NEUROLOGIC: Moves all 4 extremities. PSYCHIATRIC: The patient is alert and oriented x 3.  SKIN: No obvious rash, lesion, or ulcer.   DATA REVIEW:   CBC  Recent Labs Lab 08/30/16 0552  WBC 17.2*  HGB 13.9  HCT 42.2  PLT 235    Chemistries   Recent Labs Lab 08/28/16 0455  08/30/16 0552  NA 131*  < > 133*  K 5.3*  < > 4.6  CL 96*  < > 99*  CO2 26  < > 28  GLUCOSE 164*  < > 131*  BUN 64*  < > 55*  CREATININE 1.04  < > 1.03  CALCIUM 9.6  < > 8.6*  MG 2.1  --   --   < > = values in this interval not displayed.  Cardiac Enzymes No results for input(s): TROPONINI in the last 168 hours.  Microbiology Results  Results for orders placed or performed during the hospital encounter of 08/19/16  Blood Culture (routine x 2)     Status: None   Collection Time: 08/19/16  5:53 AM  Result Value Ref Range Status   Specimen Description BLOOD LEFT FA  Final   Special Requests   Final    BOTTLES DRAWN AEROBIC AND ANAEROBIC Blood Culture adequate volume   Culture NO GROWTH 5 DAYS  Final   Report Status 08/24/2016 FINAL  Final  Blood Culture (routine x 2)     Status: None   Collection Time: 08/19/16  5:53 AM  Result Value Ref Range Status   Specimen Description BLOOD RIGHT HAND  Final   Special Requests   Final    BOTTLES DRAWN AEROBIC AND ANAEROBIC Blood Culture results may not be optimal due to an inadequate volume of blood received in culture bottles   Culture NO GROWTH 5 DAYS  Final   Report Status 08/24/2016 FINAL  Final   Urine culture     Status: None   Collection Time: 08/19/16 11:00 AM  Result Value Ref Range Status   Specimen Description URINE, RANDOM  Final   Special Requests NONE  Final   Culture   Final    NO GROWTH Performed at [facility], [place] [address], [place], [place] [address]    Report Status  08/21/2016 FINAL  Final  MRSA PCR Screening     Status: None   Collection Time: 08/20/16  9:00 AM  Result Value Ref Range Status   MRSA by PCR NEGATIVE NEGATIVE Final    Comment:        The GeneXpert MRSA Assay (FDA approved for NASAL specimens only), is one component of a comprehensive MRSA colonization surveillance program. It is not intended to diagnose MRSA infection nor to guide or monitor treatment for MRSA infections.   Body fluid culture     Status: None   Collection Time: 08/20/16  2:48 PM  Result Value Ref Range Status   Specimen Description PLEURAL  Final   Special Requests NONE  Final   Gram Stain   Final    FEW WBC PRESENT,BOTH PMN AND MONONUCLEAR NO ORGANISMS SEEN    Culture   Final    NO GROWTH 3 DAYS Performed at [facility], [address], [place], [place] [address]    Report Status 08/24/2016 FINAL  Final    RADIOLOGY:  No results found.  Follow up with PCP in 1 week.  Management plans discussed with the patient, family and they are in agreement.  CODE STATUS:  Code Status History    Date Active Date Inactive Code Status Order ID Comments User Context   08/19/2016  1:15 PM 08/30/2016  5:28 PM DNR [id number]  Harrie Foreman, MD Inpatient   03/02/2016 10:36 PM 03/05/2016  8:20 PM DNR [id number]  Mikael Spray, NP ED   01/09/2016  6:58 AM 01/14/2016  7:39 PM DNR [id number]  Harrie Foreman, MD Inpatient   11/09/2015  3:18 AM 11/12/2015  5:13 PM DNR [id number]  Harrie Foreman, MD Inpatient   06/14/2015  4:39 PM 06/18/2015  3:13 PM DNR [id number]  Demetrios Loll, MD Inpatient    Questions for Most Recent Historical Code Status (Order [id number])     Question Answer Comment   In the event of cardiac or respiratory ARREST Do not call a "code blue"    In the event of cardiac or respiratory ARREST Do not perform Intubation, CPR, defibrillation or ACLS    In the event of cardiac or respiratory ARREST Use medication by any route, position, wound care, and other measures to relive pain and suffering. May use oxygen, suction and manual treatment of airway obstruction as needed for comfort.         Advance Directive Documentation     Most Recent Value  Type of Advance Directive  Living will  Pre-existing out of facility DNR order (yellow form or pink MOST form)  -  "MOST" Form in Place?  -      TOTAL TIME TAKING CARE OF THIS PATIENT ON DAY OF DISCHARGE: more than 30 minutes.   Hillary Bow R M.D on 09/03/2016 at 1:05 PM  Between 7am to 6pm - Pager - [phone]  After 6pm go to [email or web address] - password [email or web address] [facility]  Office  [phone]  CC: Primary care physician; [name], MD  Note: This dictation was prepared with Dragon dictation along with smaller phrase technology. Any transcriptional errors that result from this process are unintentional.

## 2016-09-22 ENCOUNTER — Other Ambulatory Visit: Payer: Self-pay | Admitting: Urology

## 2016-09-22 DIAGNOSIS — N390 Urinary tract infection, site not specified: Secondary | ICD-10-CM

## 2016-09-29 ENCOUNTER — Inpatient Hospital Stay: Payer: Medicare Other | Admitting: Pulmonary Disease

## 2016-09-30 NOTE — Telephone Encounter (Signed)
Patient and family declined a sooner appt with [facility] 8/28 at 830

## 2016-10-17 ENCOUNTER — Inpatient Hospital Stay: Payer: Medicare Other | Admitting: Pulmonary Disease

## 2016-10-21 ENCOUNTER — Ambulatory Visit: Payer: Medicare Other

## 2016-10-21 DIAGNOSIS — R300 Dysuria: Secondary | ICD-10-CM

## 2016-10-21 NOTE — Progress Notes (Signed)
Pt daughter in law called stating last night when pt went to cath prior to inserting catheter he noticed puss coming out of the tip of his penis. Pt described his s/s to be decreased ability to be able to urinate on his own, dysuria, urgency and frequency, and feeling fatigued. A cath'd specimen was obtained for u/a and cx.

## 2016-10-22 LAB — URINALYSIS, COMPLETE
Bilirubin, UA: NEGATIVE
Glucose, UA: NEGATIVE
KETONES UA: NEGATIVE
NITRITE UA: NEGATIVE
SPEC GRAV UA: 1.025 (ref 1.005–1.030)
UUROB: 0.2 mg/dL (ref 0.2–1.0)
pH, UA: 6 (ref 5.0–7.5)

## 2016-10-22 LAB — MICROSCOPIC EXAMINATION

## 2016-10-24 ENCOUNTER — Telehealth: Payer: Self-pay

## 2016-10-24 DIAGNOSIS — N390 Urinary tract infection, site not specified: Secondary | ICD-10-CM

## 2016-10-24 MED ORDER — NITROFURANTOIN MONOHYD MACRO 100 MG PO CAPS: 100.0000 mg | ORAL_CAPSULE | Freq: Two times a day (BID) | ORAL | 0 refills | Status: DC

## 2016-10-24 NOTE — Telephone Encounter (Signed)
LMOM-medication sent to pharmacy 

## 2016-10-24 NOTE — Telephone Encounter (Signed)
-----   Message from Nori Riis, PA-C sent at 10/24/2016  4:25 PM EDT ----- Please let Mr. Butterfield know that his urine culture is still in preliminary status.  We can send in Macrobid 100 mg, one capsule bid x 7.

## 2016-10-26 LAB — CULTURE, URINE COMPREHENSIVE

## 2016-10-27 NOTE — Telephone Encounter (Signed)
Spoke w/Patient's daughter in law [place] and notified her of results

## 2016-10-27 NOTE — Telephone Encounter (Signed)
-----   Message from Nori Riis, PA-C sent at 10/27/2016  7:48 AM EDT ----- Please let Mr. Swiderski know that Macrobid is the appropriate antibiotic.

## 2016-10-28 ENCOUNTER — Other Ambulatory Visit: Payer: Self-pay | Admitting: Urology

## 2016-10-28 DIAGNOSIS — N390 Urinary tract infection, site not specified: Secondary | ICD-10-CM

## 2016-11-14 ENCOUNTER — Other Ambulatory Visit
Admission: RE | Admit: 2016-11-14 | Discharge: 2016-11-14 | Disposition: A | Discharge status: Home / Self Care | Payer: Medicare Other | Source: Ambulatory Visit | Attending: Pulmonary Disease | Admitting: Pulmonary Disease

## 2016-11-14 ENCOUNTER — Ambulatory Visit
Admission: RE | Admit: 2016-11-14 | Discharge: 2016-11-14 | Disposition: A | Discharge status: Home / Self Care | Payer: Medicare Other | Source: Ambulatory Visit | Attending: Pulmonary Disease | Admitting: Pulmonary Disease

## 2016-11-14 ENCOUNTER — Ambulatory Visit (INDEPENDENT_AMBULATORY_CARE_PROVIDER_SITE_OTHER): Payer: Medicare Other | Admitting: Pulmonary Disease

## 2016-11-14 ENCOUNTER — Encounter: Payer: Self-pay | Admitting: Pulmonary Disease

## 2016-11-14 VITALS — BP 122/70 | HR 52 | Ht 65.0 in

## 2016-11-14 DIAGNOSIS — R531 Weakness: Secondary | ICD-10-CM

## 2016-11-14 DIAGNOSIS — R001 Bradycardia, unspecified: Secondary | ICD-10-CM

## 2016-11-14 DIAGNOSIS — Z952 Presence of prosthetic heart valve: Secondary | ICD-10-CM

## 2016-11-14 DIAGNOSIS — Z85118 Personal history of other malignant neoplasm of bronchus and lung: Secondary | ICD-10-CM | POA: Diagnosis not present

## 2016-11-14 DIAGNOSIS — J701 Chronic and other pulmonary manifestations due to radiation: Secondary | ICD-10-CM | POA: Diagnosis not present

## 2016-11-14 DIAGNOSIS — J90 Pleural effusion, not elsewhere classified: Secondary | ICD-10-CM

## 2016-11-14 DIAGNOSIS — J9611 Chronic respiratory failure with hypoxia: Secondary | ICD-10-CM | POA: Diagnosis not present

## 2016-11-14 DIAGNOSIS — R5381 Other malaise: Secondary | ICD-10-CM

## 2016-11-14 DIAGNOSIS — I517 Cardiomegaly: Secondary | ICD-10-CM

## 2016-11-14 DIAGNOSIS — Z95810 Presence of automatic (implantable) cardiac defibrillator: Secondary | ICD-10-CM | POA: Insufficient documentation

## 2016-11-14 DIAGNOSIS — R918 Other nonspecific abnormal finding of lung field: Secondary | ICD-10-CM

## 2016-11-14 LAB — COMPREHENSIVE METABOLIC PANEL
ALBUMIN: 4 g/dL (ref 3.5–5.0)
ALK PHOS: 70 U/L (ref 38–126)
ALT: 12 U/L — ABNORMAL LOW (ref 17–63)
ANION GAP: 12 (ref 5–15)
AST: 21 U/L (ref 15–41)
BUN: 28 mg/dL — ABNORMAL HIGH (ref 6–20)
CALCIUM: 10.2 mg/dL (ref 8.9–10.3)
CHLORIDE: 103 mmol/L (ref 101–111)
CO2: 27 mmol/L (ref 22–32)
Creatinine, Ser: 1.22 mg/dL (ref 0.61–1.24)
GFR calc Af Amer: >60 mL/min (ref >60)
GFR calc non Af Amer: 55 mL/min — ABNORMAL LOW (ref >60)
GLUCOSE: 122 mg/dL — AB (ref 65–99)
POTASSIUM: 5.6 mmol/L — AB (ref 3.5–5.1)
SODIUM: 142 mmol/L (ref 135–145)
Total Bilirubin: 0.7 mg/dL (ref 0.3–1.2)
Total Protein: 8.8 g/dL — ABNORMAL HIGH (ref 6.5–8.1)

## 2016-11-14 LAB — DIGOXIN LEVEL: Digoxin Level: 1.2 ng/mL (ref 0.8–2.0)

## 2016-11-14 NOTE — Progress Notes (Signed)
PULMONARY OFFICE FOLLOW NOTE   PROBLEMS:  History of lung cancer, S/P XRT to R lung 2015 Extensive R lung radiation fibrosis Chronic hypoxemic respiratory failure Hospitalized 01/28-01/31/18 for severe sepsis and presumed PNA  DATA/EVENTS: PET 03/02/15: No findings suspicious for local recurrence or definite metastatic disease CT chest 11/10/15: Persistent volume loss, prominent fibrosis, bronchiectasis and interstitial infiltrates in the right upper lobe, with similar but less severe changes in the remaining of the right lung parenchyma. Increasing perifissural and peripleural nodular and linear interstitial thickening in the left lung. Hospitalized 07/17-07/28/18 with acute on chronic respiratory failure due to CAP, NOS.   INTERVAL HISTORY: No major respiratory or pulmonary events since discharge in July  SUBJ: He has been in his USOH until this morning. He now reports general malaise, weakness, nausea. He denies any respiratory or chest symptoms including cough, sputum production, hemoptysis, chest pain. He has not had fever. He has recently been treated for urinary tract infection with Macrodantin  OBJ: Vitals:   11/14/16 1344 11/14/16 1349  BP:  122/70  Pulse:  (!) 52  SpO2:  92%  Height: 5\' 5"  (1.651 m)   2 LPM [place] pulse  Gen: NAD in WC HEENT: All WNL Neck: No LAN, no JVD noted Lungs: BS markedly diminished on R with bronchial breath sounds in right upper lobe and right base, no wheezes, minimal left basilar crackles Cardiovascular: Initially bradycardic with extrasystoles, II/VI SEM Abdomen: Soft, NT +BS Ext: Cool without C/C/E Neuro: CNs intact, motor/sens grossly intact Skin: No lesions noted  DATA: EKG: paced rhythm 118/min CXR 11/14/16: Compared to prior film in July, there is improved aeration in the right lung. Left lung reveals mildly increased interstitial prominence  IMPRESSION: Chronic respiratory failure with hypoxia (HCC)  History of lung  cancer  Extensive radiation fibrosis of right lung   Malaise - Plan: Comprehensive metabolic panel  Weakness - Plan: Comprehensive metabolic panel  Bradycardia - Plan: EKG 12-Lead, Digoxin level  DISCUSSION: His initial exam and pulse oximetry revealed relative bradycardia. By the time the EKG was performed, bradycardia had resolved by both exam and pulse oximetry. Notably, he also felt better with less dizziness and weakness.  PLAN: Continue DuoNeb every 6 hours as needed Continue oxygen therapy as close to 24 hours per day as possible Comprehensive metabolic panel and digitoxin level have been ordered for today We will fax the EKG to Dr. Ubaldo Glassing Follow-up in 3-4 months or sooner as needed  Merton Border, MD PCCM service Mobile [phone] Pager [phone] 11/14/2016 3:02 PM

## 2016-11-14 NOTE — Patient Instructions (Addendum)
EKG today  Blood test today - comprehensive metabolic panel, digoxin level  Continue Duoneb as currently prescribed  Continue oxygen therapy as currently prescribed  Follow up in 3-4 months

## 2016-11-17 ENCOUNTER — Emergency Department: Payer: Medicare Other

## 2016-11-17 ENCOUNTER — Encounter: Payer: Self-pay | Admitting: Emergency Medicine

## 2016-11-17 ENCOUNTER — Inpatient Hospital Stay
Admission: EM | Admit: 2016-11-17 | Discharge: 2016-11-20 | DRG: 699 | Disposition: A | Discharge status: Home Health | Payer: Medicare Other | Attending: Internal Medicine | Admitting: Internal Medicine

## 2016-11-17 ENCOUNTER — Inpatient Hospital Stay: Payer: Medicare Other

## 2016-11-17 DIAGNOSIS — N319 Neuromuscular dysfunction of bladder, unspecified: Secondary | ICD-10-CM | POA: Diagnosis present

## 2016-11-17 DIAGNOSIS — Z515 Encounter for palliative care: Secondary | ICD-10-CM | POA: Diagnosis present

## 2016-11-17 DIAGNOSIS — J449 Chronic obstructive pulmonary disease, unspecified: Secondary | ICD-10-CM | POA: Diagnosis present

## 2016-11-17 DIAGNOSIS — B957 Other staphylococcus as the cause of diseases classified elsewhere: Secondary | ICD-10-CM | POA: Diagnosis present

## 2016-11-17 DIAGNOSIS — Y92009 Unspecified place in unspecified non-institutional (private) residence as the place of occurrence of the external cause: Secondary | ICD-10-CM | POA: Diagnosis not present

## 2016-11-17 DIAGNOSIS — Z66 Do not resuscitate: Secondary | ICD-10-CM | POA: Diagnosis present

## 2016-11-17 DIAGNOSIS — C3491 Malignant neoplasm of unspecified part of right bronchus or lung: Secondary | ICD-10-CM | POA: Diagnosis present

## 2016-11-17 DIAGNOSIS — E7800 Pure hypercholesterolemia, unspecified: Secondary | ICD-10-CM | POA: Diagnosis present

## 2016-11-17 DIAGNOSIS — Z7982 Long term (current) use of aspirin: Secondary | ICD-10-CM | POA: Diagnosis not present

## 2016-11-17 DIAGNOSIS — Z792 Long term (current) use of antibiotics: Secondary | ICD-10-CM

## 2016-11-17 DIAGNOSIS — I5032 Chronic diastolic (congestive) heart failure: Secondary | ICD-10-CM | POA: Diagnosis present

## 2016-11-17 DIAGNOSIS — Z87442 Personal history of urinary calculi: Secondary | ICD-10-CM

## 2016-11-17 DIAGNOSIS — R531 Weakness: Secondary | ICD-10-CM | POA: Diagnosis present

## 2016-11-17 DIAGNOSIS — J9610 Chronic respiratory failure, unspecified whether with hypoxia or hypercapnia: Secondary | ICD-10-CM | POA: Diagnosis present

## 2016-11-17 DIAGNOSIS — K219 Gastro-esophageal reflux disease without esophagitis: Secondary | ICD-10-CM | POA: Diagnosis present

## 2016-11-17 DIAGNOSIS — Z8614 Personal history of Methicillin resistant Staphylococcus aureus infection: Secondary | ICD-10-CM

## 2016-11-17 DIAGNOSIS — E1140 Type 2 diabetes mellitus with diabetic neuropathy, unspecified: Secondary | ICD-10-CM | POA: Diagnosis present

## 2016-11-17 DIAGNOSIS — Z923 Personal history of irradiation: Secondary | ICD-10-CM

## 2016-11-17 DIAGNOSIS — E039 Hypothyroidism, unspecified: Secondary | ICD-10-CM | POA: Diagnosis present

## 2016-11-17 DIAGNOSIS — N390 Urinary tract infection, site not specified: Secondary | ICD-10-CM | POA: Diagnosis not present

## 2016-11-17 DIAGNOSIS — I110 Hypertensive heart disease with heart failure: Secondary | ICD-10-CM | POA: Diagnosis present

## 2016-11-17 DIAGNOSIS — I959 Hypotension, unspecified: Secondary | ICD-10-CM | POA: Diagnosis present

## 2016-11-17 DIAGNOSIS — T83511A Infection and inflammatory reaction due to indwelling urethral catheter, initial encounter: Secondary | ICD-10-CM | POA: Diagnosis not present

## 2016-11-17 DIAGNOSIS — Z880 Allergy status to penicillin: Secondary | ICD-10-CM

## 2016-11-17 DIAGNOSIS — Z9981 Dependence on supplemental oxygen: Secondary | ICD-10-CM

## 2016-11-17 DIAGNOSIS — Y846 Urinary catheterization as the cause of abnormal reaction of the patient, or of later complication, without mention of misadventure at the time of the procedure: Secondary | ICD-10-CM | POA: Diagnosis present

## 2016-11-17 DIAGNOSIS — Z882 Allergy status to sulfonamides status: Secondary | ICD-10-CM

## 2016-11-17 DIAGNOSIS — E785 Hyperlipidemia, unspecified: Secondary | ICD-10-CM | POA: Diagnosis present

## 2016-11-17 DIAGNOSIS — Z95810 Presence of automatic (implantable) cardiac defibrillator: Secondary | ICD-10-CM | POA: Diagnosis not present

## 2016-11-17 DIAGNOSIS — Z87440 Personal history of urinary (tract) infections: Secondary | ICD-10-CM | POA: Diagnosis not present

## 2016-11-17 DIAGNOSIS — Z87891 Personal history of nicotine dependence: Secondary | ICD-10-CM

## 2016-11-17 DIAGNOSIS — N400 Enlarged prostate without lower urinary tract symptoms: Secondary | ICD-10-CM | POA: Diagnosis present

## 2016-11-17 HISTORY — DX: Chronic obstructive pulmonary disease, unspecified: J44.9

## 2016-11-17 LAB — COMPREHENSIVE METABOLIC PANEL
ALBUMIN: 3.9 g/dL (ref 3.5–5.0)
ALT: 17 U/L (ref 17–63)
ANION GAP: 12 (ref 5–15)
AST: 29 U/L (ref 15–41)
Alkaline Phosphatase: 80 U/L (ref 38–126)
BILIRUBIN TOTAL: 1.2 mg/dL (ref 0.3–1.2)
BUN: 19 mg/dL (ref 6–20)
CHLORIDE: 100 mmol/L — AB (ref 101–111)
CO2: 25 mmol/L (ref 22–32)
Calcium: 9.6 mg/dL (ref 8.9–10.3)
Creatinine, Ser: 1.01 mg/dL (ref 0.61–1.24)
GFR calc Af Amer: >60 mL/min (ref >60)
Glucose, Bld: 124 mg/dL — ABNORMAL HIGH (ref 65–99)
POTASSIUM: 4 mmol/L (ref 3.5–5.1)
Sodium: 137 mmol/L (ref 135–145)
Total Protein: 8.9 g/dL — ABNORMAL HIGH (ref 6.5–8.1)

## 2016-11-17 LAB — CBC
HCT: 42.2 % (ref 40.0–52.0)
HEMOGLOBIN: 14 g/dL (ref 13.0–18.0)
MCH: 29.6 pg (ref 26.0–34.0)
MCHC: 33.1 g/dL (ref 32.0–36.0)
MCV: 89.4 fL (ref 80.0–100.0)
Platelets: 200 10*3/uL (ref 150–440)
RBC: 4.72 MIL/uL (ref 4.40–5.90)
RDW: 16.4 % — ABNORMAL HIGH (ref 11.5–14.5)
WBC: 9 10*3/uL (ref 3.8–10.6)

## 2016-11-17 LAB — URINALYSIS, COMPLETE (UACMP) WITH MICROSCOPIC
BACTERIA UA: NONE SEEN
BILIRUBIN URINE: NEGATIVE
Glucose, UA: NEGATIVE mg/dL
Hgb urine dipstick: NEGATIVE
KETONES UR: NEGATIVE mg/dL
NITRITE: NEGATIVE
PH: 6 (ref 5.0–8.0)
PROTEIN: 30 mg/dL — AB
Specific Gravity, Urine: 1.019 (ref 1.005–1.030)

## 2016-11-17 LAB — GLUCOSE, CAPILLARY
GLUCOSE-CAPILLARY: 125 mg/dL — AB (ref 65–99)
Glucose-Capillary: 97 mg/dL (ref 65–99)

## 2016-11-17 LAB — TROPONIN I: Troponin I: <0.03 ng/mL (ref <0.03)

## 2016-11-17 MED ORDER — HYDROCORTISONE 10 MG PO TABS
10.0000 mg | ORAL_TABLET | Freq: Two times a day (BID) | ORAL | Status: DC
  Administered 2016-11-17 – 2016-11-20 (×6): 10 mg via ORAL
  Filled 2016-11-17 (×7): qty 1

## 2016-11-17 MED ORDER — GUAIFENESIN-DM 100-10 MG/5ML PO SYRP
5.0000 mL | ORAL_SOLUTION | ORAL | Status: DC | PRN
  Filled 2016-11-17: qty 5

## 2016-11-17 MED ORDER — ADULT MULTIVITAMIN W/MINERALS CH
1.0000 | ORAL_TABLET | Freq: Every day | ORAL | Status: DC
  Administered 2016-11-18 – 2016-11-20 (×3): 1 via ORAL
  Filled 2016-11-17 (×3): qty 1

## 2016-11-17 MED ORDER — VANCOMYCIN HCL 10 G IV SOLR
1250.0000 mg | INTRAVENOUS | Status: DC
  Administered 2016-11-17 – 2016-11-20 (×4): 1250 mg via INTRAVENOUS
  Filled 2016-11-17 (×5): qty 1250

## 2016-11-17 MED ORDER — INSULIN ASPART 100 UNIT/ML ~~LOC~~ SOLN
0.0000 [IU] | Freq: Three times a day (TID) | SUBCUTANEOUS | Status: DC
  Administered 2016-11-17: 0 [IU] via SUBCUTANEOUS
  Filled 2016-11-17: qty 1

## 2016-11-17 MED ORDER — SODIUM CHLORIDE 0.9 % IV BOLUS (SEPSIS)
500.0000 mL | Freq: Once | INTRAVENOUS | Status: AC
  Administered 2016-11-17: 500 mL via INTRAVENOUS

## 2016-11-17 MED ORDER — MAGNESIUM OXIDE 400 (241.3 MG) MG PO TABS
400.0000 mg | ORAL_TABLET | Freq: Every day | ORAL | Status: DC
  Administered 2016-11-18 – 2016-11-20 (×3): 400 mg via ORAL
  Filled 2016-11-17 (×3): qty 1

## 2016-11-17 MED ORDER — SODIUM CHLORIDE 0.9 % IV BOLUS (SEPSIS): 1000.0000 mL | Freq: Once | INTRAVENOUS | Status: DC

## 2016-11-17 MED ORDER — PANTOPRAZOLE SODIUM 40 MG PO TBEC
40.0000 mg | DELAYED_RELEASE_TABLET | Freq: Once | ORAL | Status: AC
  Administered 2016-11-17: 19:00:00 40 mg via ORAL
  Filled 2016-11-17: qty 1

## 2016-11-17 MED ORDER — DIGOXIN 250 MCG PO TABS
250.0000 ug | ORAL_TABLET | Freq: Every day | ORAL | Status: DC
  Administered 2016-11-18 – 2016-11-20 (×3): 250 ug via ORAL
  Filled 2016-11-17 (×4): qty 1

## 2016-11-17 MED ORDER — GABAPENTIN 600 MG PO TABS
600.0000 mg | ORAL_TABLET | Freq: Two times a day (BID) | ORAL | Status: DC
  Administered 2016-11-17 – 2016-11-19 (×5): 600 mg via ORAL
  Filled 2016-11-17 (×5): qty 1

## 2016-11-17 MED ORDER — ASPIRIN EC 81 MG PO TBEC
81.0000 mg | DELAYED_RELEASE_TABLET | Freq: Every day | ORAL | Status: DC
  Administered 2016-11-18 – 2016-11-20 (×3): 81 mg via ORAL
  Filled 2016-11-17 (×4): qty 1

## 2016-11-17 MED ORDER — SODIUM CHLORIDE 0.9 % IV SOLN
INTRAVENOUS | Status: DC
Start: 2016-11-17 — End: 2016-11-18
  Administered 2016-11-17: 18:00:00 via INTRAVENOUS

## 2016-11-17 MED ORDER — COENZYME Q10 100 MG PO CAPS: 100.0000 mg | ORAL_CAPSULE | Freq: Every day | ORAL | Status: DC

## 2016-11-17 MED ORDER — RISAQUAD PO CAPS
1.0000 | ORAL_CAPSULE | Freq: Every day | ORAL | Status: DC
  Administered 2016-11-18 – 2016-11-20 (×3): 1 via ORAL
  Filled 2016-11-17 (×4): qty 1

## 2016-11-17 MED ORDER — SIMVASTATIN 20 MG PO TABS
20.0000 mg | ORAL_TABLET | Freq: Every day | ORAL | Status: DC
  Administered 2016-11-18 – 2016-11-19 (×2): 20 mg via ORAL
  Filled 2016-11-17 (×3): qty 1

## 2016-11-17 MED ORDER — DEXTROSE 5 % IV SOLN
1.0000 g | INTRAVENOUS | Status: DC
  Administered 2016-11-18: 1 g via INTRAVENOUS
  Filled 2016-11-17 (×2): qty 10

## 2016-11-17 MED ORDER — VANCOMYCIN HCL IN DEXTROSE 1-5 GM/200ML-% IV SOLN
1000.0000 mg | INTRAVENOUS | Status: AC
  Administered 2016-11-17: 1000 mg via INTRAVENOUS
  Filled 2016-11-17: qty 200

## 2016-11-17 MED ORDER — PANTOPRAZOLE SODIUM 40 MG PO TBEC
40.0000 mg | DELAYED_RELEASE_TABLET | Freq: Every day | ORAL | Status: DC
  Administered 2016-11-18 – 2016-11-20 (×3): 40 mg via ORAL
  Filled 2016-11-17 (×3): qty 1

## 2016-11-17 MED ORDER — CEFTRIAXONE SODIUM IN DEXTROSE 20 MG/ML IV SOLN
1.0000 g | Freq: Once | INTRAVENOUS | Status: AC
  Administered 2016-11-17: 1 g via INTRAVENOUS
  Filled 2016-11-17: qty 50

## 2016-11-17 MED ORDER — DOCUSATE SODIUM 100 MG PO CAPS: 100.0000 mg | ORAL_CAPSULE | Freq: Two times a day (BID) | ORAL | Status: DC | PRN

## 2016-11-17 MED ORDER — LEVOTHYROXINE SODIUM 50 MCG PO TABS
50.0000 ug | ORAL_TABLET | Freq: Every day | ORAL | Status: DC
  Administered 2016-11-18 – 2016-11-20 (×3): 50 ug via ORAL
  Filled 2016-11-17 (×3): qty 1

## 2016-11-17 MED ORDER — HEPARIN SODIUM (PORCINE) 5000 UNIT/ML IJ SOLN
5000.0000 [IU] | Freq: Three times a day (TID) | INTRAMUSCULAR | Status: DC
  Administered 2016-11-17 – 2016-11-20 (×8): 5000 [IU] via SUBCUTANEOUS
  Filled 2016-11-17 (×8): qty 1

## 2016-11-17 MED ORDER — MIDODRINE HCL 5 MG PO TABS
10.0000 mg | ORAL_TABLET | Freq: Two times a day (BID) | ORAL | Status: DC
  Administered 2016-11-18 – 2016-11-20 (×5): 10 mg via ORAL
  Filled 2016-11-17 (×6): qty 2

## 2016-11-17 NOTE — Progress Notes (Signed)
Patient requesting regular diet, paged Dr. Anselm Jungling and verbal order given for regular diet.  Clarise Cruz, RN

## 2016-11-17 NOTE — ED Notes (Signed)
Waiting on admitting MD. NAD. Family remains at bedside.

## 2016-11-17 NOTE — Progress Notes (Signed)
Patient stating he cannot tolerate medications at this time, requesting protonix for his acid reflex which is not due until tomorrow morning.  Dr. Molli Hazard paged and verbal order for protonix given.  Clarise Cruz, RN

## 2016-11-17 NOTE — H&P (Signed)
[facility] NAME: Russell Howard    MR#:  [id number]  DATE OF BIRTH:  1934/04/16  DATE OF ADMISSION:  11/17/2016  PRIMARY CARE PHYSICIAN: [name], MD   REQUESTING/REFERRING PHYSICIAN: Siadecki  CHIEF COMPLAINT:   Chief Complaint  Patient presents with  . Weakness    HISTORY OF PRESENT ILLNESS: Russell Howard  is a 82 y.o. male with a known history of Hypothyroidism, BPH, Cardiomyopathy, CHF, COPD, Lung cancer, Neuropathy- Have recurrent UTI in past- 3.4 times last year. ( had E fecalis and MRSA in Urine last month) Feeling weak and fatigue ( Like always with UTI) came to ER, noted to have UTI again.  PAST MEDICAL HISTORY:   Past Medical History:  Diagnosis Date  . Acquired hypothyroidism 11/02/2014  . BPH (benign prostatic hyperplasia)   . Cardiac defibrillator in place 11/02/2014  . Cardiomyopathy ([place])   . CHF (congestive heart failure) ([place])   . Chronic diastolic heart failure ([place]) 11/02/2014  . COPD (chronic obstructive pulmonary disease) ([place])   . Gastro-esophageal reflux disease without esophagitis 11/02/2014  . H/O malignant neoplasm 11/09/2014  . Heart valve disease 11/02/2014  . History of atrial fibrillation 11/02/2014  . Lung cancer ([place])   . Neuropathy 11/02/2014  . Orthostasis 11/02/2014  . Pure hypercholesterolemia 11/02/2014  . Valvular heart disease     PAST SURGICAL HISTORY: Past Surgical History:  Procedure Laterality Date  . APPENDECTOMY    . DUAL ICD IMPLANT  2007/2009   implantable cardiac defibrillator  . PORT-A-CATH REMOVAL      SOCIAL HISTORY:  Social History  Substance Use Topics  . Smoking status: Former Smoker    Types: Cigarettes    Quit date: 11/21/1981  . Smokeless tobacco: Never Used  . Alcohol use No    FAMILY HISTORY:  Family History  Problem Relation Age of Onset  . Hypertension Unknown   . Heart disease Unknown     DRUG ALLERGIES:  Allergies  Allergen Reactions  .  Penicillin G Hives    Has patient had a PCN reaction causing immediate rash, facial/tongue/throat swelling, SOB or lightheadedness with hypotension: Yes Has patient had a PCN reaction causing severe rash involving mucus membranes or skin necrosis: No Has patient had a PCN reaction that required hospitalization No Has patient had a PCN reaction occurring within the last 10 years: No If all of the above answers are "NO", then may proceed with Cephalosporin use.  . Sulfa Antibiotics Rash    REVIEW OF SYSTEMS:   CONSTITUTIONAL: No fever, positive for fatigue or weakness.  EYES: No blurred or double vision.  EARS, NOSE, AND THROAT: No tinnitus or ear pain.  RESPIRATORY: No cough, shortness of breath, wheezing or hemoptysis.  CARDIOVASCULAR: No chest pain, orthopnea, edema.  GASTROINTESTINAL: No nausea, vomiting, diarrhea or abdominal pain.  GENITOURINARY: No dysuria, hematuria.  ENDOCRINE: No polyuria, nocturia,  HEMATOLOGY: No anemia, easy bruising or bleeding SKIN: No rash or lesion. MUSCULOSKELETAL: No joint pain or arthritis.   NEUROLOGIC: No tingling, numbness, weakness.  PSYCHIATRY: No anxiety or depression.   MEDICATIONS AT HOME:  Prior to Admission medications   Medication Sig Start Date End Date Taking? Authorizing Provider  acidophilus (RISAQUAD) CAPS capsule Take 1 capsule by mouth daily.   Yes [provider]  Ascorbic Acid (VITAMIN C) 1000 MG tablet Take 500 mg by mouth 3 (three) times daily. Reported on 06/14/2015   Yes [provider]  aspirin EC 81  MG tablet Take 81 mg by mouth daily.    Yes [provider]  Cholecalciferol 5000 units TABS Take 5,000 Units by mouth every morning.   Yes [provider]  Coenzyme Q10 (TH CO Q-10) 100 MG capsule Take 100 mg by mouth daily.    Yes [provider]  Zavala 250 MCG tablet Take 1 tablet (250 mcg total) by mouth daily. 08/30/16  Yes Sudini, Alveta Heimlich, MD  gabapentin (NEURONTIN) 600 MG tablet  Take 600 mg by mouth 2 (two) times daily.    Yes [provider]  hydrocortisone (CORTEF) 10 MG tablet Take 10 mg by mouth 2 (two) times daily.    Yes [provider]  levofloxacin (LEVAQUIN) 500 MG tablet Take 1 tablet (500 mg total) by mouth daily. 08/30/16  Yes Sudini, Alveta Heimlich, MD  levothyroxine (SYNTHROID, LEVOTHROID) 50 MCG tablet Take 50 mcg by mouth daily before breakfast.   Yes [provider]  MAGNESIUM-OXIDE 400 (241.3 Mg) MG tablet Take 400 mg by mouth daily.   Yes [provider]  methenamine (HIPREX) 1 g tablet TAKE 1 TABLET BY MOUTH TWICE DAILY WITH MEALS 10/28/16  Yes Hollice Espy, MD  midodrine (PROAMATINE) 10 MG tablet Take 1 tablet (10 mg total) by mouth 3 (three) times daily with meals. Patient taking differently: Take 10 mg by mouth 2 (two) times daily with a meal.  08/30/16  Yes Sudini, Alveta Heimlich, MD  Multiple Vitamin (MULTIVITAMIN WITH MINERALS) TABS tablet Take 1 tablet by mouth daily.   Yes [provider]  pantoprazole (PROTONIX) 40 MG tablet Take 40 mg by mouth daily before breakfast.    Yes [provider]  SAW PALMETTO-PUMPKIN SEED OIL PO Take 1 capsule by mouth daily.    Yes [provider]  simvastatin (ZOCOR) 20 MG tablet Take 20 mg by mouth daily at 6 PM.    Yes [provider]  vitamin B-12 (CYANOCOBALAMIN) 500 MCG tablet Take 500 mcg by mouth daily.    Yes [provider]  vitamin E 400 UNIT capsule Take 400 Units by mouth daily.    Yes [provider]  docusate sodium (COLACE) 100 MG capsule Take 1 capsule (100 mg total) by mouth 2 (two) times daily. Patient not taking: Reported on 11/17/2016 08/30/16 08/30/17  Hillary Bow, MD  furosemide (LASIX) 20 MG tablet Take 20 mg by mouth every other day as needed (as needed for water retention).  05/10/15   [provider]  guaiFENesin-dextromethorphan (ROBITUSSIN DM) 100-10 MG/5ML syrup Take 5 mLs by mouth every 4 (four) hours as  needed for cough. 08/27/16   Hillary Bow, MD  ipratropium-albuterol (DUONEB) 0.5-2.5 (3) MG/3ML SOLN Take 3 mLs by nebulization every 6 (six) hours as needed. Patient not taking: Reported on 11/17/2016 06/12/16   Wilhelmina Mcardle, MD  nitrofurantoin, macrocrystal-monohydrate, (MACROBID) 100 MG capsule Take 1 capsule (100 mg total) by mouth every 12 (twelve) hours. Patient not taking: Reported on 11/17/2016 10/24/16   Zara Council A, PA-C  ondansetron (ZOFRAN ODT) 4 MG disintegrating tablet Take 1 tablet (4 mg total) by mouth every 8 (eight) hours as needed for nausea or vomiting. 08/30/16   Hillary Bow, MD  potassium chloride (MICRO-K) 10 MEQ CR capsule Take 10 mEq by mouth daily as needed (only with lasix).     [provider]      PHYSICAL EXAMINATION:   VITAL SIGNS: Blood pressure 108/65, pulse 98, temperature 97.7 F (36.5 C), temperature source Oral, resp. rate (!) 38,  height 5\' 10"  (1.778 m), weight 77.1 kg (170 lb), SpO2 98 %.  GENERAL:  82 y.o.-year-old patient lying in the bed with no acute distress.  EYES: Pupils equal, round, reactive to light and accommodation. No scleral icterus. Extraocular muscles intact.  HEENT: Head atraumatic, normocephalic. Oropharynx and nasopharynx clear.  NECK:  Supple, no jugular venous distention. No thyroid enlargement, no tenderness.  LUNGS: Normal breath sounds bilaterally, no wheezing, rales,rhonchi or crepitation. No use of accessory muscles of respiration.  CARDIOVASCULAR: S1, S2 normal. No murmurs, rubs, or gallops.  ABDOMEN: Soft, nontender, nondistended. Bowel sounds present. No organomegaly or mass. No tenderness On CV angle.  EXTREMITIES: No pedal edema, cyanosis, or clubbing.  NEUROLOGIC: Cranial nerves II through XII are intact. Muscle strength 4/5 in all extremities. Sensation intact. Gait not checked.  PSYCHIATRIC: The patient is alert and oriented x 3.  SKIN: No obvious rash, lesion, or ulcer.   LABORATORY PANEL:    CBC  Recent Labs Lab 11/17/16 1057  WBC 9.0  HGB 14.0  HCT 42.2  PLT 200  MCV 89.4  MCH 29.6  MCHC 33.1  RDW 16.4*   ------------------------------------------------------------------------------------------------------------------  Chemistries   Recent Labs Lab 11/14/16 1527 11/17/16 1057  NA 142 137  K 5.6* 4.0  CL 103 100*  CO2 27 25  GLUCOSE 122* 124*  BUN 28* 19  CREATININE 1.22 1.01  CALCIUM 10.2 9.6  AST 21 29  ALT 12* 17  ALKPHOS 70 80  BILITOT 0.7 1.2   ------------------------------------------------------------------------------------------------------------------ estimated creatinine clearance is 61.2 mL/min (by C-G formula based on SCr of 1.01 mg/dL). ------------------------------------------------------------------------------------------------------------------ No results for input(s): TSH, T4TOTAL, T3FREE, THYROIDAB in the last 72 hours.  Invalid input(s): FREET3   Coagulation profile No results for input(s): INR, PROTIME in the last 168 hours. ------------------------------------------------------------------------------------------------------------------- No results for input(s): DDIMER in the last 72 hours. -------------------------------------------------------------------------------------------------------------------  Cardiac Enzymes  Recent Labs Lab 11/17/16 1057  TROPONINI <0.03   ------------------------------------------------------------------------------------------------------------------ Invalid input(s): POCBNP  ---------------------------------------------------------------------------------------------------------------  Urinalysis    Component Value Date/Time   COLORURINE YELLOW (A) 11/17/2016 1057   APPEARANCEUR CLOUDY (A) 11/17/2016 1057   APPEARANCEUR Cloudy (A) 10/21/2016 1543   LABSPEC 1.019 11/17/2016 1057   PHURINE 6.0 11/17/2016 1057   GLUCOSEU NEGATIVE 11/17/2016 1057   HGBUR NEGATIVE 11/17/2016  1057   BILIRUBINUR NEGATIVE 11/17/2016 1057   BILIRUBINUR Negative 10/21/2016 1543   KETONESUR NEGATIVE 11/17/2016 1057   PROTEINUR 30 (A) 11/17/2016 1057   NITRITE NEGATIVE 11/17/2016 1057   LEUKOCYTESUR LARGE (A) 11/17/2016 1057   LEUKOCYTESUR 1+ (A) 10/21/2016 1543     RADIOLOGY: Dg Chest Portable 1 View  Result Date: 11/17/2016 CLINICAL DATA:  Weakness and fatigue. EXAM: PORTABLE CHEST 1 VIEW COMPARISON:  11/14/2016 and 08/22/2016 and 03/02/2016 FINDINGS: Heart size and pulmonary vascularity are normal. There is extensive chronic partial opacification of the right hemithorax, essentially unchanged since the prior study. No infiltrates or effusions on the left. AICD.  Prosthetic aortic valve.  Aortic atherosclerosis. IMPRESSION: No acute abnormalities. Extensive chronic changes in the right hemithorax. Electronically Signed   By: Lorriane Shire M.D.   On: 11/17/2016 12:23    EKG: Orders placed or performed during the hospital encounter of 11/17/16  . ED EKG  . ED EKG  . EKG 12-Lead  . EKG 12-Lead    IMPRESSION AND PLAN:  * UTI   Recurrent    Had Renal stone last time , noted on CT.    Follows with Urology as out pt. I could not  see any notes.    IV Vanc ( As he had MRSA and E fecalis last month in urine) and give Rocephin to cover , if any E coli this time,. Ur cx sent   [place] enal- depending on that , we may call Urology.   ID consult to guide Abx therapy.  * CHF: Diastolic; chronic. AICD in place. Continue furosemide and digoxin *  Lung cancer: Whiteout of right lung. The patient is on palliative care. Home oxygen. * Hypothyroidism: continue Synthroid * Diabetes mellitus type 2:    Keep on ISS.  Continue gabapentin for neuropathy * Hyperlipidemia: Continue statin therapy * Episodic hypotension: Continue Midrin and hydrocortisone per outpatient regimen    IV fluids. * DVT prophylaxis: heparine * GI prophylaxis: PPI per home regimen  All the records are reviewed and  case discussed with ED provider. Management plans discussed with the patient, family and they are in agreement.  CODE STATUS: DNR Code Status History    Date Active Date Inactive Code Status Order ID Comments User Context   08/19/2016  1:15 PM 08/30/2016  5:28 PM DNR [id number]  Harrie Foreman, MD Inpatient   03/02/2016 10:36 PM 03/05/2016  8:20 PM DNR [id number]  Mikael Spray, NP ED   01/09/2016  6:58 AM 01/14/2016  7:39 PM DNR [id number]  Harrie Foreman, MD Inpatient   11/09/2015  3:18 AM 11/12/2015  5:13 PM DNR [id number]  Harrie Foreman, MD Inpatient   06/14/2015  4:39 PM 06/18/2015  3:13 PM DNR [id number]  Demetrios Loll, MD Inpatient    Questions for Most Recent Historical Code Status (Order [id number])    Question Answer Comment   In the event of cardiac or respiratory ARREST Do not call a "code blue"    In the event of cardiac or respiratory ARREST Do not perform Intubation, CPR, defibrillation or ACLS    In the event of cardiac or respiratory ARREST Use medication by any route, position, wound care, and other measures to relive pain and suffering. May use oxygen, suction and manual treatment of airway obstruction as needed for comfort.      Pt's son and grandson present in room during my visit.  TOTAL TIME TAKING CARE OF THIS PATIENT: 45 minutes.    Vaughan Basta M.D on 11/17/2016   Between 7am to 6pm - Pager - [phone]  After 6pm go to [email or web address] - password [email or web address] [facility]  Office  [phone]  CC: Primary care physician; [name], MD   Note: This dictation was prepared with Dragon dictation along with smaller phrase technology. Any transcriptional errors that result from this process are unintentional.

## 2016-11-17 NOTE — Progress Notes (Signed)
Pharmacy Antibiotic Note Russell Howard is a 82 y.o. male admitted on 11/17/2016 with UTI.  Pharmacy has been consulted for Ceftriaxone and vancomycin dosing.  Patient's last urine Cx from 9/18 show MRSA and E. Faecalis.   Plan: Patient received vancomycin 1g IV and ceftriaxone 1g in ED.   Ke: 0.057   T1/2: 12.2   Vd: 53.9 Will start Vancomycin 1250mg   IV every 18 hours with 6 hour stack dosing.  Goal trough 10-15 mcg/mL. Trough level ordered prior to 4th dose. Estimated trough at Css is 13.7.  Start Ceftriaxone 1g IV every 24 hours.    Height: 5\' 10"  (177.8 cm) Weight: 170 lb (77.1 kg) IBW/kg (Calculated) : 73  Temp (24hrs), Avg:97.7 F (36.5 C), Min:97.7 F (36.5 C), Max:97.7 F (36.5 C)   Recent Labs Lab 11/14/16 1527 11/17/16 1057  WBC  --  9.0  CREATININE 1.22 1.01    Estimated Creatinine Clearance: 61.2 mL/min (by C-G formula based on SCr of 1.01 mg/dL).    Allergies  Allergen Reactions  . Penicillin G Hives    Has patient had a PCN reaction causing immediate rash, facial/tongue/throat swelling, SOB or lightheadedness with hypotension: Yes Has patient had a PCN reaction causing severe rash involving mucus membranes or skin necrosis: No Has patient had a PCN reaction that required hospitalization No Has patient had a PCN reaction occurring within the last 10 years: No If all of the above answers are "NO", then may proceed with Cephalosporin use.  . Sulfa Antibiotics Rash    Antimicrobials this admission: 10/15 vancomycin >>  10/15 ceftriaxone >>  Dose adjustments this admission:  Microbiology results: 10/15 UCx: pending  Thank you for allowing pharmacy to be a part of this patient's care.  Pernell Dupre, PharmD, BCPS Clinical Pharmacist 11/17/2016 2:58 PM

## 2016-11-17 NOTE — Progress Notes (Signed)
Please note patient is currently followed by Out patient Palliative NP. [place] made aware. Flo Shanks RN, BSN, [facility], hospital Liaison [phone]

## 2016-11-17 NOTE — ED Notes (Signed)
Family remains at bedside. No distress. No needs.  Waiting on admit bed.

## 2016-11-17 NOTE — ED Notes (Signed)
Patient transported to Ultrasound 

## 2016-11-17 NOTE — ED Triage Notes (Signed)
Pt c/o general weakness and fatigue for 2 days. Just hasn't felt well. Denies pain. Does have cardiac hx.

## 2016-11-17 NOTE — Progress Notes (Signed)
PHARMACIST - PHYSICIAN ORDER COMMUNICATION  CONCERNING: P&T Medication Policy on Herbal Medications  DESCRIPTION:  This patient's order for:  Co-enzyme Q10 has been noted.  This product(s) is classified as an "herbal" or natural product. Due to a lack of definitive safety studies or FDA approval, nonstandard manufacturing practices, plus the potential risk of unknown drug-drug interactions while on inpatient medications, the Pharmacy and Therapeutics Committee does not permit the use of "herbal" or natural products of this type within [facility].   ACTION TAKEN: The pharmacy department is unable to verify this order at this time and your patient has been informed of this safety policy. Please reevaluate patient's clinical condition at discharge and address if the herbal or natural product(s) should be resumed at that time.   

## 2016-11-17 NOTE — ED Provider Notes (Signed)
[facility] Emergency Department Provider Note ____________________________________________   First MD Initiated Contact with Patient 11/17/16 1115     (approximate)  I have reviewed the triage vital signs and the nursing notes.   HISTORY  Chief Complaint Weakness    HPI Russell Howard is a 82 y.o. male with past medical history as below who presents with generalized weakness for the last 4 days, gradual onset, associated with decreased by mouth intake and not taking his medicines, and today associated with some mild suprapubic pain. no associated fever, headache, vomiting or diarrhea, chest pain, shortness of breath, or cough. Per daughter-in-law patient had a chest x-ray done 3 days ago which was unremarkable.  She also states that patient self caths and has had UTI and sepsis in the past.    Past Medical History:  Diagnosis Date  . Acquired hypothyroidism 11/02/2014  . BPH (benign prostatic hyperplasia)   . Cardiac defibrillator in place 11/02/2014  . Cardiomyopathy ([place])   . CHF (congestive heart failure) ([place])   . Chronic diastolic heart failure ([place]) 11/02/2014  . COPD (chronic obstructive pulmonary disease) ([place])   . Gastro-esophageal reflux disease without esophagitis 11/02/2014  . H/O malignant neoplasm 11/09/2014  . Heart valve disease 11/02/2014  . History of atrial fibrillation 11/02/2014  . Lung cancer ([place])   . Neuropathy 11/02/2014  . Orthostasis 11/02/2014  . Pure hypercholesterolemia 11/02/2014  . Valvular heart disease     Patient Active Problem List   Diagnosis Date Noted  . Empyema ([place])   . Weakness   . Community acquired pneumonia   . Pleural effusion   . Palliative care by specialist   . Acute on chronic respiratory failure with hypoxia ([place]) 08/19/2016  . Acute respiratory failure with hypoxia ([place]) 08/19/2016  . Septic shock ([place]) 03/02/2016  . Hydronephrosis, right 01/14/2016  . Constipation 01/13/2016  . Urinary retention  01/13/2016  . Lower abdominal pain 01/13/2016  . Bacteremia 01/13/2016  . Hypotension 01/13/2016  . Palliative care encounter   . Goals of care, counseling/discussion   . Cough   . Encounter for hospice care discussion   . Hypoxia 01/09/2016  . Primary cancer of bronchus of right lower lobe ([place]) 11/29/2015  . Sepsis ([place]) 06/14/2015  . Lower urinary tract infectious disease 06/14/2015    Class: Acute  . H/O malignant neoplasm 11/09/2014  . Acquired hypothyroidism 11/02/2014  . Cardiomyopathy ([place]) 11/02/2014  . Acute on chronic diastolic CHF (congestive heart failure) ([place]) 11/02/2014  . Diabetes mellitus without complication ([place]) 93/73/4287  . Cardiac defibrillator in place 11/02/2014  . Gastro-esophageal reflux disease without esophagitis 11/02/2014  . History of atrial fibrillation 11/02/2014  . BP (high blood pressure) 11/02/2014  . Neuropathy 11/02/2014  . Orthostasis 11/02/2014  . Pure hypercholesterolemia 11/02/2014  . Heart valve disease 11/02/2014    Past Surgical History:  Procedure Laterality Date  . APPENDECTOMY    . DUAL ICD IMPLANT  2007/2009   implantable cardiac defibrillator  . PORT-A-CATH REMOVAL      Prior to Admission medications   Medication Sig Start Date End Date Taking? Authorizing Provider  acidophilus (RISAQUAD) CAPS capsule Take 1 capsule by mouth daily.   Yes [provider]  Ascorbic Acid (VITAMIN C) 1000 MG tablet Take 500 mg by mouth 3 (three) times daily. Reported on 06/14/2015   Yes [provider]  aspirin EC 81 MG tablet Take 81 mg by mouth daily.    Yes [provider]  Cholecalciferol 5000  units TABS Take 5,000 Units by mouth every morning.   Yes [provider]  Coenzyme Q10 (TH CO Q-10) 100 MG capsule Take 100 mg by mouth daily.    Yes [provider]  [place] 250 MCG tablet Take 1 tablet (250 mcg total) by mouth daily. 08/30/16  Yes Sudini, Alveta Heimlich, MD  gabapentin (NEURONTIN) 600 MG tablet  Take 600 mg by mouth 2 (two) times daily.    Yes [provider]  hydrocortisone (CORTEF) 10 MG tablet Take 10 mg by mouth 2 (two) times daily.    Yes [provider]  levofloxacin (LEVAQUIN) 500 MG tablet Take 1 tablet (500 mg total) by mouth daily. 08/30/16  Yes Sudini, Alveta Heimlich, MD  levothyroxine (SYNTHROID, LEVOTHROID) 50 MCG tablet Take 50 mcg by mouth daily before breakfast.   Yes [provider]  MAGNESIUM-OXIDE 400 (241.3 Mg) MG tablet Take 400 mg by mouth daily.   Yes [provider]  methenamine (HIPREX) 1 g tablet TAKE 1 TABLET BY MOUTH TWICE DAILY WITH MEALS 10/28/16  Yes Hollice Espy, MD  midodrine (PROAMATINE) 10 MG tablet Take 1 tablet (10 mg total) by mouth 3 (three) times daily with meals. Patient taking differently: Take 10 mg by mouth 2 (two) times daily with a meal.  08/30/16  Yes Sudini, Alveta Heimlich, MD  Multiple Vitamin (MULTIVITAMIN WITH MINERALS) TABS tablet Take 1 tablet by mouth daily.   Yes [provider]  pantoprazole (PROTONIX) 40 MG tablet Take 40 mg by mouth daily before breakfast.    Yes [provider]  SAW PALMETTO-PUMPKIN SEED OIL PO Take 1 capsule by mouth daily.    Yes [provider]  simvastatin (ZOCOR) 20 MG tablet Take 20 mg by mouth daily at 6 PM.    Yes [provider]  vitamin B-12 (CYANOCOBALAMIN) 500 MCG tablet Take 500 mcg by mouth daily.    Yes [provider]  vitamin E 400 UNIT capsule Take 400 Units by mouth daily.    Yes [provider]  docusate sodium (COLACE) 100 MG capsule Take 1 capsule (100 mg total) by mouth 2 (two) times daily. Patient not taking: Reported on 11/17/2016 08/30/16 08/30/17  Hillary Bow, MD  furosemide (LASIX) 20 MG tablet Take 20 mg by mouth every other day as needed (as needed for water retention).  05/10/15   [provider]  guaiFENesin-dextromethorphan (ROBITUSSIN DM) 100-10 MG/5ML syrup Take 5 mLs by mouth every 4 (four) hours as  needed for cough. 08/27/16   Hillary Bow, MD  ipratropium-albuterol (DUONEB) 0.5-2.5 (3) MG/3ML SOLN Take 3 mLs by nebulization every 6 (six) hours as needed. Patient not taking: Reported on 11/17/2016 06/12/16   Wilhelmina Mcardle, MD  nitrofurantoin, macrocrystal-monohydrate, (MACROBID) 100 MG capsule Take 1 capsule (100 mg total) by mouth every 12 (twelve) hours. Patient not taking: Reported on 11/17/2016 10/24/16   Zara Council A, PA-C  ondansetron (ZOFRAN ODT) 4 MG disintegrating tablet Take 1 tablet (4 mg total) by mouth every 8 (eight) hours as needed for nausea or vomiting. 08/30/16   Hillary Bow, MD  potassium chloride (MICRO-K) 10 MEQ CR capsule Take 10 mEq by mouth daily as needed (only with lasix).     [provider]    Allergies Penicillin g and Sulfa antibiotics  Family History  Problem Relation Age of Onset  . Hypertension Unknown   . Heart disease Unknown     Social History Social History  Substance Use Topics  . Smoking status: Former Smoker  Types: Cigarettes    Quit date: 11/21/1981  . Smokeless tobacco: Never Used  . Alcohol use No    Review of Systems  Constitutional: Positive for generalized weakness.  Eyes: No redness. ENT: No neck pain. Cardiovascular: Denies chest pain. Respiratory: Denies shortness of breath. Gastrointestinal: No vomiting.  No diarrhea.  Genitourinary: Negative for dysuria. Positive for suprapubic pain.  Musculoskeletal: Negative for back pain. Skin: Negative for rash. Neurological: Negative for headache.   ____________________________________________   PHYSICAL EXAM:  VITAL SIGNS: ED Triage Vitals  Enc Vitals Group     BP 11/17/16 1057 124/81     Pulse Rate 11/17/16 1057 (!) 104     Resp 11/17/16 1057 (!) 30     Temp 11/17/16 1057 97.7 F (36.5 C)     Temp Source 11/17/16 1057 Oral     SpO2 11/17/16 1057 98 %     Weight 11/17/16 1055 170 lb (77.1 kg)     Height 11/17/16 1055 5\' 10"  (1.778 m)      Head Circumference --      Peak Flow --      Pain Score --      Pain Loc --      Pain Edu? --      Excl. in [place]? --     Constitutional: Alert and oriented x3, chronically ill appearing, no acute distress.  Eyes: Conjunctivae are normal.  EOMI.  PERRLA.  Head: Atraumatic. Nose: No congestion/rhinnorhea. Mouth/Throat: Mucous membranes are dry.    Neck: Normal range of motion.  Cardiovascular: Tachy, regular rhythm. Grossly normal heart sounds.  Good peripheral circulation. Respiratory: Normal respiratory effort.  No retractions. Lungs CTAB. Gastrointestinal: Soft and nontender. Minimal suprapubic discomfort.  Genitourinary: No CVA tenderness. Musculoskeletal: No lower extremity edema.  Extremities warm and well perfused.  Neurologic:  Normal speech and language. No gross focal neurologic deficits are appreciated.  Skin:  Skin is warm and dry. No rash noted. Psychiatric: Mood and affect are normal. Speech and behavior are normal.  ____________________________________________   LABS (all labs ordered are listed, but only abnormal results are displayed)  Labs Reviewed  CBC - Abnormal; Notable for the following:       Result Value   RDW 16.4 (*)    All other components within normal limits  URINALYSIS, COMPLETE (UACMP) WITH MICROSCOPIC - Abnormal; Notable for the following:    Color, Urine YELLOW (*)    APPearance CLOUDY (*)    Protein, ur 30 (*)    Leukocytes, UA LARGE (*)    Squamous Epithelial / LPF 0-5 (*)    All other components within normal limits  COMPREHENSIVE METABOLIC PANEL - Abnormal; Notable for the following:    Chloride 100 (*)    Glucose, Bld 124 (*)    Total Protein 8.9 (*)    All other components within normal limits  TROPONIN I  CBG MONITORING, ED   ____________________________________________  EKG  ED ECG REPORT I, Arta Silence, the attending physician, personally viewed and interpreted this ECG.  Date: 11/17/2016 EKG Time: 1057 Rate:  105 Rhythm: atrial paced QRS Axis: n/a Intervals: n/a ST/T Wave abnormalities: no acute findings Narrative Interpretation: no evidence of acute ischemia; no significant change when compared to EKG of 03/03/2016  ____________________________________________  RADIOLOGY    ____________________________________________   PROCEDURES  Procedure(s) performed: No    Critical Care performed: No ____________________________________________   INITIAL IMPRESSION / ASSESSMENT AND PLAN / ED COURSE  Pertinent labs & imaging results that were available  during my care of the patient were reviewed by me and considered in my medical decision making (see chart for details).  82 year old male with past medical history as noted presents with generalized weakness for the last several days associated with some suprapubic discomfort today.  On review of since prior history in Epic, he has history of prior admissions for UTI and sepsis, and antibiotic sensitivities from prior urine cultures were reviewed.  in the ED, patient is slightly tachycardic but other vital signs are normal, and he is chronically ill but not acutely toxic appearing. Exam otherwise as described. Overall differential includes UTI, other source of infection such as pneumonia or viral, dehydration, metabolic cause, less likely cardiac. Plan: Labs, infection workup, fluids, and reassess.    ----------------------------------------- 12:57 PM on 11/17/2016 -----------------------------------------  Chest x-ray and troponin are negative. Patient given ceftriaxone for UA findings consistent with UTI. Heart rate improved after fluids. Plan for admission - signed out to hospitalist.  I spent 10 minutes discussing plan of care with patient and family member (daughter in law).    ____________________________________________   FINAL CLINICAL IMPRESSION(S) / ED DIAGNOSES  Final diagnoses:  Urinary tract infection without hematuria, site  unspecified  Generalized weakness      NEW MEDICATIONS STARTED DURING THIS VISIT:  New Prescriptions   No medications on file     Note:  This document was prepared using Dragon voice recognition software and may include unintentional dictation errors.    Arta Silence, MD 11/17/16 1258

## 2016-11-18 LAB — CBC
HEMATOCRIT: 39.2 % — AB (ref 40.0–52.0)
Hemoglobin: 13.2 g/dL (ref 13.0–18.0)
MCH: 30.2 pg (ref 26.0–34.0)
MCHC: 33.6 g/dL (ref 32.0–36.0)
MCV: 89.8 fL (ref 80.0–100.0)
Platelets: 171 10*3/uL (ref 150–440)
RBC: 4.36 MIL/uL — ABNORMAL LOW (ref 4.40–5.90)
RDW: 15.4 % — AB (ref 11.5–14.5)
WBC: 7.5 10*3/uL (ref 3.8–10.6)

## 2016-11-18 LAB — BASIC METABOLIC PANEL
Anion gap: 7 (ref 5–15)
BUN: 18 mg/dL (ref 6–20)
CHLORIDE: 104 mmol/L (ref 101–111)
CO2: 25 mmol/L (ref 22–32)
Calcium: 9 mg/dL (ref 8.9–10.3)
Creatinine, Ser: 1.09 mg/dL (ref 0.61–1.24)
GFR calc Af Amer: >60 mL/min (ref >60)
GFR calc non Af Amer: >60 mL/min (ref >60)
GLUCOSE: 101 mg/dL — AB (ref 65–99)
Potassium: 4.2 mmol/L (ref 3.5–5.1)
Sodium: 136 mmol/L (ref 135–145)

## 2016-11-18 LAB — GLUCOSE, CAPILLARY
GLUCOSE-CAPILLARY: 98 mg/dL (ref 65–99)
GLUCOSE-CAPILLARY: 161 mg/dL — AB (ref 65–99)
Glucose-Capillary: 96 mg/dL (ref 65–99)
Glucose-Capillary: 128 mg/dL — ABNORMAL HIGH (ref 65–99)

## 2016-11-18 NOTE — Evaluation (Signed)
Physical Therapy Evaluation Patient Details Name: Russell Howard MRN: [id number] DOB: August 24, 1934 Today's Date: 11/18/2016   History of Present Illness  Pt is a 82 y.o.malewith a known history of Hypothyroidism, BPH, Cardiomyopathy, CHF, COPD, Lung cancer, Neuropathy- Had recurrent UTI in past- 3.4 times last year. ( had E fecalis and MRSA in Urine last month).  Feeling weak and fatigued, came to ER, noted to have UTI again.  Assessment includes: UTI, CHF, lung CA, hypothyroidism, DM II, HLD, and episodic hypotension.    Clinical Impression  Pt presents with deficits in strength, transfers, mobility, gait, balance, and activity tolerance.  Pt was SBA with extra time and effort required for all bed mob tasks.  Pt CGA with transfers with fair stability and was steady with amb x 20' with RW and CGA without LOB.  Pt ambulated with very slow cadence and short B step length.  Pt fatigued quickly with amb with SpO2 remaining >/= 93% and HR increasing to the low 110s.  Pt limited functionally at baseline secondary to complex medical history but appears to be even more limited than baseline currently.  Pt will benefit from HHPT services upon discharge to safely address above deficits for decreased caregiver assistance and eventual return to PLOF.      Follow Up Recommendations Home health PT    Equipment Recommendations  None recommended by PT    Recommendations for Other Services       Precautions / Restrictions Precautions Precautions: Fall Restrictions Weight Bearing Restrictions: No      Mobility  Bed Mobility Overal bed mobility: Needs Assistance Bed Mobility: Supine to Sit;Sit to Supine     Supine to sit: Supervision Sit to supine: Supervision   General bed mobility comments: Extra time and effort required for all bed mobility tasks but no physical assistance  Transfers Overall transfer level: Needs assistance Equipment used: Rolling walker (2 wheeled) Transfers: Sit to/from  Stand Sit to Stand: Min guard         General transfer comment: Min verbal cues for proper hand placement during transfers  Ambulation/Gait Ambulation/Gait assistance: Min guard Ambulation Distance (Feet): 20 Feet Assistive device: Rolling walker (2 wheeled) Gait Pattern/deviations: Step-through pattern;Decreased step length - right;Decreased step length - left;Trunk flexed     General Gait Details: Pt steady with amb without LOB with very slow cadence and short B step length.  Pt fatigued quickly with amb with SpO2 remaining >/= 93% and HR increasing to the low 110s.   Stairs            Wheelchair Mobility    Modified Rankin (Stroke Patients Only)       Balance Overall balance assessment: Needs assistance Sitting-balance support: Feet supported;Bilateral upper extremity supported Sitting balance-Leahy Scale: Good     Standing balance support: Bilateral upper extremity supported Standing balance-Leahy Scale: Fair                               Pertinent Vitals/Pain Pain Assessment: No/denies pain    Home Living Family/patient expects to be discharged to:: Private residence Living Arrangements: Children;Other relatives;Other (Comment) (Pt lives with son, daughter-in-law, and grandson who home schools) Available Help at Discharge: Family;Available 24 hours/day Type of Home: House Home Access: Ramped entrance     Home Layout: Two level;Able to live on main level with bedroom/bathroom Home Equipment: Wheelchair - manual;Walker - 4 wheels      Prior Function Level of Independence: Needs  assistance   Gait / Transfers Assistance Needed: Limited household ambulator with rollator with SBA from family for safety, no fall history  ADL's / Homemaking Assistance Needed: Requires max assist for bathing and dressing from daughter in law  Comments: Wheelchair used for community access     [employer]   Dominant Hand: Right    Extremity/Trunk  Assessment   Upper Extremity Assessment Upper Extremity Assessment: Generalized weakness    Lower Extremity Assessment Lower Extremity Assessment: Generalized weakness       Communication   Communication: No difficulties  Cognition Arousal/Alertness: Awake/alert Behavior During Therapy: WFL for tasks assessed/performed Overall Cognitive Status: Within Functional Limits for tasks assessed                                        General Comments      Exercises Total Joint Exercises Ankle Circles/Pumps: AROM;Both;10 reps Heel Slides: AROM;Both;5 reps Hip ABduction/ADduction: AROM;Both;5 reps Long Arc Quad: AROM;Both;10 reps Knee Flexion: AROM;Both;10 reps   Assessment/Plan    PT Assessment Patient needs continued PT services  PT Problem List Decreased activity tolerance;Decreased balance;Decreased mobility;Decreased strength       PT Treatment Interventions DME instruction;Gait training;Functional mobility training;Neuromuscular re-education;Balance training;Therapeutic exercise;Therapeutic activities;Patient/family education    PT Goals (Current goals can be found in the Care Plan section)  Acute Rehab PT Goals Patient Stated Goal: Improved strength and activity tolerance PT Goal Formulation: With patient Time For Goal Achievement: 12/01/16 Potential to Achieve Goals: Fair    Frequency Min 2X/week   Barriers to discharge        Co-evaluation               AM-PAC PT "6 Clicks" Daily Activity  Outcome Measure Difficulty turning over in bed (including adjusting bedclothes, sheets and blankets)?: A Little Difficulty moving from lying on back to sitting on the side of the bed? : A Little Difficulty sitting down on and standing up from a chair with arms (e.g., wheelchair, bedside commode, etc,.)?: Unable Help needed moving to and from a bed to chair (including a wheelchair)?: A Little Help needed walking in hospital room?: A Little Help needed  climbing 3-5 steps with a railing? : A Lot 6 Click Score: 15    End of Session Equipment Utilized During Treatment: Gait belt;Oxygen Activity Tolerance: Patient limited by fatigue Patient left: in bed;with call bell/phone within reach;with family/visitor present (Family refused bed alarm stating they worked out a system with nursing to turn on alarm when they leave) Nurse Communication: Mobility status PT Visit Diagnosis: Difficulty in walking, not elsewhere classified (R26.2);Muscle weakness (generalized) (M62.81)    Time: 7209-4709 PT Time Calculation (min) (ACUTE ONLY): 24 min   Charges:   PT Evaluation $PT Eval Low Complexity: 1 Low PT Treatments $Therapeutic Exercise: 8-22 mins   PT G Codes:   PT G-Codes **NOT FOR INPATIENT CLASS** Functional Assessment Tool Used: AM-PAC 6 Clicks Basic Mobility Functional Limitation: Mobility: Walking and moving around Mobility: Walking and Moving Around Current Status (G2836): At least 40 percent but less than 60 percent impaired, limited or restricted Mobility: Walking and Moving Around Goal Status [phone]): At least 1 percent but less than 20 percent impaired, limited or restricted    D. Royetta Asal PT, DPT 11/18/16, 12:26 PM

## 2016-11-18 NOTE — Care Management Note (Signed)
Case Management Note  Patient Details  Name: Russell Howard MRN: [id number] Date of Birth: 11/03/1934  Subjective/Objective:   Admitted to [facility] with the diagnosis of urinary tract infection. Lives with daughter, Jaymar Loeber [phone]). Last seen Dr. Doy Hutching 10/30/16. Prescriptions are filled at [facility] on [address]. Home Health per [place] in the past. Ended services September 2018. Skilled Nursing facility in [place]. Home oxygen since 01/2016. Uses 2 liters per nasal cannula, increases liter flow with exertion. Palliative Care is following in the home. Nebulizer, rolling walker, wheelchair, raised toilet and flexible bed in the home. Takes care of all basic activities of daily living himself. No falls. Fair appetite. Daughter will transport.                 Action/Plan: Physical therapy evaluation completed. Recommending home with home health and physical therapy. Would like [facility] again. Will update Floydene Flock, [facility] representative.   Expected Discharge Date:                  Expected Discharge Plan:     In-House Referral:     Discharge planning Services     Post Acute Care Choice:   yes Choice offered to:   Mr. Pinedo  DME Arranged:    DME Agency:     HH Arranged:   yes HH Agency:   [facility]   Status of Service:     If discussed at [place] of Stay Meetings, dates discussed:    Additional Comments:  Shelbie Ammons, [place] Management [phone] 11/18/2016, 10:56 AM

## 2016-11-18 NOTE — Progress Notes (Signed)
Patient ID: Russell Howard, male   DOB: July 12, 1934, 82 y.o.   MRN: [id number]  [facility] PROGRESS NOTE  Drayk Humbarger ZHG:[id number] DOB: 01-17-1935 DOA: 11/17/2016 PCP: [name], MD  HPI/Subjective: Patient feeling better today. Yesterday feeling very weak. He knew he had a UTI with some burning on urination. He normally does straight in and out catheters twice a day.  Objective: Vitals:   11/18/16 0952 11/18/16 1210  BP:    Pulse: 96 96  Resp:    Temp:    SpO2:  96%    Filed Weights   11/17/16 1055 11/17/16 1633  Weight: 77.1 kg (170 lb) 77.1 kg (169 lb 14.4 oz)    ROS: Review of Systems  Constitutional: Negative for chills and fever.  Eyes: Negative for blurred vision.  Respiratory: Negative for cough and shortness of breath.   Cardiovascular: Negative for chest pain.  Gastrointestinal: Negative for constipation, diarrhea, nausea and vomiting.  Genitourinary: Negative for dysuria.  Musculoskeletal: Negative for joint pain.  Neurological: Positive for weakness. Negative for dizziness and headaches.   Exam: Physical Exam  Constitutional: He is oriented to person, place, and time.  HENT:  Nose: No mucosal edema.  Mouth/Throat: No oropharyngeal exudate or posterior oropharyngeal edema.  Eyes: Pupils are equal, round, and reactive to light. Conjunctivae, EOM and lids are normal.  Neck: No JVD present. Carotid bruit is not present. No edema present. No thyroid mass and no thyromegaly present.  Cardiovascular: S1 normal and S2 normal.  Exam reveals no gallop.   No murmur heard. Pulses:      Dorsalis pedis pulses are 2+ on the right side, and 2+ on the left side.  Respiratory: No respiratory distress. He has no wheezes. He has no rhonchi. He has no rales.  GI: Soft. Bowel sounds are normal. There is no tenderness.  Musculoskeletal:       Right ankle: He exhibits swelling.       Left ankle: He exhibits swelling.  Lymphadenopathy:    He has no cervical  adenopathy.  Neurological: He is alert and oriented to person, place, and time. No cranial nerve deficit.  Skin: Skin is warm. No rash noted. Nails show no clubbing.  Psychiatric: He has a normal mood and affect.      Data Reviewed: Basic Metabolic Panel:  Recent Labs Lab 11/14/16 1527 11/17/16 1057 11/18/16 0436  NA 142 137 136  K 5.6* 4.0 4.2  CL 103 100* 104  CO2 27 25 25   GLUCOSE 122* 124* 101*  BUN 28* 19 18  CREATININE 1.22 1.01 1.09  CALCIUM 10.2 9.6 9.0   Liver Function Tests:  Recent Labs Lab 11/14/16 1527 11/17/16 1057  AST 21 29  ALT 12* 17  ALKPHOS 70 80  BILITOT 0.7 1.2  PROT 8.8* 8.9*  ALBUMIN 4.0 3.9   CBC:  Recent Labs Lab 11/17/16 1057 11/18/16 0436  WBC 9.0 7.5  HGB 14.0 13.2  HCT 42.2 39.2*  MCV 89.4 89.8  PLT 200 171   Cardiac Enzymes:  Recent Labs Lab 11/17/16 1057  TROPONINI <0.03   BNP (last 3 results)  Recent Labs  08/19/16 0553  BNP 179.0*     CBG:  Recent Labs Lab 11/17/16 1644 11/17/16 2126 11/18/16 0749 11/18/16 1212  GLUCAP 97 125* 98 161*     Studies: US Renal  Result Date: 11/17/2016 CLINICAL DATA:  Urinary tract infection. EXAM: RENAL / URINARY TRACT ULTRASOUND COMPLETE COMPARISON:  CT scan August 28, 2016. FINDINGS: Right  Kidney: Length: 9.9 cm. 6 mm exophytic cyst is seen in lower pole. 1.2 cm simple cyst is seen in midpole. Echogenicity within normal limits. No mass or hydronephrosis visualized. Left Kidney: Length: 10.9 cm. Echogenicity within normal limits. No mass or hydronephrosis visualized. Bladder: Enlarged prostate gland is noted. Mild wall thickening of urinary bladder is noted which may be due to incomplete distention. However, cystitis cannot be excluded. IMPRESSION: No significant renal abnormality is noted. Enlarged prostate gland is noted. Mild wall thickening of urinary bladder is noted which may be due to incomplete distention, but cystitis cannot be excluded. Electronically Signed   By:  Marijo Conception, M.D.   On: 11/17/2016 16:26   Dg Chest Portable 1 View  Result Date: 11/17/2016 CLINICAL DATA:  Weakness and fatigue. EXAM: PORTABLE CHEST 1 VIEW COMPARISON:  11/14/2016 and 08/22/2016 and 03/02/2016 FINDINGS: Heart size and pulmonary vascularity are normal. There is extensive chronic partial opacification of the right hemithorax, essentially unchanged since the prior study. No infiltrates or effusions on the left. AICD.  Prosthetic aortic valve.  Aortic atherosclerosis. IMPRESSION: No acute abnormalities. Extensive chronic changes in the right hemithorax. Electronically Signed   By: Lorriane Shire M.D.   On: 11/17/2016 12:23    Scheduled Meds: . acidophilus  1 capsule Oral Daily  . aspirin EC  81 mg Oral Daily  . digoxin  250 mcg Oral Daily  . gabapentin  600 mg Oral BID  . heparin  5,000 Units Subcutaneous Q8H  . hydrocortisone  10 mg Oral BID  . insulin aspart  0-9 Units Subcutaneous TID WC  . levothyroxine  50 mcg Oral QAC breakfast  . magnesium oxide  400 mg Oral Daily  . midodrine  10 mg Oral BID WC  . multivitamin with minerals  1 tablet Oral Daily  . pantoprazole  40 mg Oral QAC breakfast  . simvastatin  20 mg Oral q1800   Continuous Infusions: . cefTRIAXone (ROCEPHIN)  IV Stopped (11/18/16 1407)  . vancomycin Stopped (11/17/16 2249)    Assessment/Plan:  1. Recurrent urinary tract infections. His risk is straight catheterizations. He is on Rocephin and vancomycin here for history of MRSA and enterococcus in the urine last month.  Will need to follow-up urine culture results before disposition. 2. Chronic respiratory failure on oxygen 3. Chronic mid range congestive heart failure. I stopped IV fluids. Hold Lasix. 4. History of lung cancer 5. Hypothyroidism unspecified on levothyroxin 6. Hyperlipidemia unspecified on simvastatin 7. GERD on Protonix 8. Weakness physical therapy evaluation  Code Status:     Code Status Orders        Start     Ordered    11/17/16 5366  Do not attempt resuscitation (DNR)  Continuous    Question Answer Comment  In the event of cardiac or respiratory ARREST Do not call a "code blue"   In the event of cardiac or respiratory ARREST Do not perform Intubation, CPR, defibrillation or ACLS   In the event of cardiac or respiratory ARREST Use medication by any route, position, wound care, and other measures to relive pain and suffering. May use oxygen, suction and manual treatment of airway obstruction as needed for comfort.      11/17/16 1631    Code Status History    Date Active Date Inactive Code Status Order ID Comments User Context   08/19/2016  1:15 PM 08/30/2016  5:28 PM DNR [id number]  Harrie Foreman, MD Inpatient   03/02/2016 10:36 PM 03/05/2016  8:20  PM DNR [id number]  Mikael Spray, NP ED   01/09/2016  6:58 AM 01/14/2016  7:39 PM DNR [id number]  Harrie Foreman, MD Inpatient   11/09/2015  3:18 AM 11/12/2015  5:13 PM DNR [id number]  Harrie Foreman, MD Inpatient   06/14/2015  4:39 PM 06/18/2015  3:13 PM DNR [id number]  Demetrios Loll, MD Inpatient    Advance Directive Documentation     Most Recent Value  Type of Advance Directive  Healthcare Power of [place], Living will  Pre-existing out of facility DNR order (yellow form or pink MOST form)  -  "MOST" Form in Place?  -     Disposition Plan: home once urine culture resulted  Antibiotics:  Vancomycin  Rocephin  Time spent: 28 minutes  [place], [place]

## 2016-11-19 LAB — BASIC METABOLIC PANEL
ANION GAP: 7 (ref 5–15)
BUN: 22 mg/dL — ABNORMAL HIGH (ref 6–20)
CO2: 25 mmol/L (ref 22–32)
Calcium: 9.5 mg/dL (ref 8.9–10.3)
Chloride: 105 mmol/L (ref 101–111)
Creatinine, Ser: 1.16 mg/dL (ref 0.61–1.24)
GFR, EST NON AFRICAN AMERICAN: 58 mL/min — AB (ref >60)
GLUCOSE: 121 mg/dL — AB (ref 65–99)
POTASSIUM: 4.2 mmol/L (ref 3.5–5.1)
Sodium: 137 mmol/L (ref 135–145)

## 2016-11-19 LAB — GLUCOSE, CAPILLARY
GLUCOSE-CAPILLARY: 118 mg/dL — AB (ref 65–99)
Glucose-Capillary: 109 mg/dL — ABNORMAL HIGH (ref 65–99)
Glucose-Capillary: 125 mg/dL — ABNORMAL HIGH (ref 65–99)
Glucose-Capillary: 121 mg/dL — ABNORMAL HIGH (ref 65–99)

## 2016-11-19 MED ORDER — FUROSEMIDE 20 MG PO TABS
20.0000 mg | ORAL_TABLET | Freq: Every day | ORAL | Status: DC
  Administered 2016-11-20: 10:00:00 20 mg via ORAL
  Filled 2016-11-19: qty 1

## 2016-11-19 NOTE — Care Management Important Message (Signed)
Important Message  Patient Details  Name: Jedediah Noda MRN: [id number] Date of Birth: Jan 15, 1935   Medicare Important Message Given:  Yes    Shelbie Ammons, RN 11/19/2016, 8:09 AM

## 2016-11-19 NOTE — Plan of Care (Signed)
Problem: Urinary Elimination: Goal: Signs and symptoms of infection will decrease Outcome: Progressing Pt uses urinal at bedside; Pt continues to perform self caths PRN too; Infectious Disease Consult performed by Dr Ola Spurr this shift

## 2016-11-19 NOTE — Progress Notes (Signed)
VT was ordered for 0930 this morning. Unfortunately, vancomycin dose has already been started, will have to retime VT with next dose.  Lenis Noon, PharmD, BCPS 11/19/16 8:53 AM

## 2016-11-19 NOTE — Consult Note (Signed)
[facility] Infectious Disease     Reason for Consult: UTI   Referring Physician: Karlton Lemon Date of Admission:  11/17/2016   Active Problems:   Acute lower UTI   UTI (urinary tract infection)   HPI: Russell Howard is a 82 y.o. male admitted with weakness and fatigue.  On admit had wbc 9, no fever, UA with TNTC and  UCX with Staph Epi. He does CIC due to  He has hx lung cancer s/p XRT. Is on home O2.   Past Medical History:  Diagnosis Date  . Acquired hypothyroidism 11/02/2014  . BPH (benign prostatic hyperplasia)   . Cardiac defibrillator in place 11/02/2014  . Cardiomyopathy ([place])   . CHF (congestive heart failure) ([place])   . Chronic diastolic heart failure ([place]) 11/02/2014  . COPD (chronic obstructive pulmonary disease) ([place])   . Gastro-esophageal reflux disease without esophagitis 11/02/2014  . H/O malignant neoplasm 11/09/2014  . Heart valve disease 11/02/2014  . History of atrial fibrillation 11/02/2014  . Lung cancer ([place])   . Neuropathy 11/02/2014  . Orthostasis 11/02/2014  . Pure hypercholesterolemia 11/02/2014  . Valvular heart disease    Past Surgical History:  Procedure Laterality Date  . APPENDECTOMY    . DUAL ICD IMPLANT  2007/2009   implantable cardiac defibrillator  . PORT-A-CATH REMOVAL     Social History  Substance Use Topics  . Smoking status: Former Smoker    Types: Cigarettes    Quit date: 11/21/1981  . Smokeless tobacco: Never Used  . Alcohol use No   Family History  Problem Relation Age of Onset  . Hypertension Unknown   . Heart disease Unknown     Allergies:  Allergies  Allergen Reactions  . Penicillin G Hives    Has patient had a PCN reaction causing immediate rash, facial/tongue/throat swelling, SOB or lightheadedness with hypotension: Yes Has patient had a PCN reaction causing severe rash involving mucus membranes or skin necrosis: No Has patient had a PCN reaction that required hospitalization No Has patient had a PCN reaction occurring  within the last 10 years: No If all of the above answers are "NO", then may proceed with Cephalosporin use.  . Sulfa Antibiotics Rash    Current antibiotics: Antibiotics Given (last 72 hours)    Date/Time Action Medication Dose Rate   11/17/16 1213 New Bag/Given   cefTRIAXone (ROCEPHIN) 1 g in dextrose 5 % 50 mL IVPB - Premix 1 g 100 mL/hr   11/17/16 1501 New Bag/Given   vancomycin (VANCOCIN) IVPB 1000 mg/200 mL premix 1,000 mg 200 mL/hr   11/17/16 2119 New Bag/Given   vancomycin (VANCOCIN) 1,250 mg in sodium chloride 0.9 % 250 mL IVPB 1,250 mg 166.7 mL/hr   11/18/16 1321 New Bag/Given   cefTRIAXone (ROCEPHIN) 1 g in dextrose 5 % 50 mL IVPB 1 g 100 mL/hr   11/18/16 1752 New Bag/Given   vancomycin (VANCOCIN) 1,250 mg in sodium chloride 0.9 % 250 mL IVPB 1,250 mg 166.7 mL/hr   11/19/16 0841 New Bag/Given   vancomycin (VANCOCIN) 1,250 mg in sodium chloride 0.9 % 250 mL IVPB 1,250 mg 166.7 mL/hr      MEDICATIONS: . acidophilus  1 capsule Oral Daily  . aspirin EC  81 mg Oral Daily  . digoxin  250 mcg Oral Daily  . [START ON 11/20/2016] furosemide  20 mg Oral Daily  . gabapentin  600 mg Oral BID  . heparin  5,000 Units Subcutaneous Q8H  . hydrocortisone  10 mg Oral BID  .  insulin aspart  0-9 Units Subcutaneous TID WC  . levothyroxine  50 mcg Oral QAC breakfast  . magnesium oxide  400 mg Oral Daily  . midodrine  10 mg Oral BID WC  . multivitamin with minerals  1 tablet Oral Daily  . pantoprazole  40 mg Oral QAC breakfast  . simvastatin  20 mg Oral q1800    Review of Systems - 11 systems reviewed and negative per HPI   OBJECTIVE: Temp:  [97.6 F (36.4 C)-99 F (37.2 C)] 97.6 F (36.4 C) (10/17 0544) Pulse Rate:  [93-107] 93 (10/17 0544) Resp:  [20] 20 (10/17 0544) BP: (100-132)/(54-75) 100/63 (10/17 0544) SpO2:  [95 %-100 %] 100 % (10/17 0544) Physical Exam  Constitutional: He is oriented to person, place, and time. Frail.  HENT: anicteric Mouth/Throat: Oropharynx is  clear and dry.  Cardiovascular: Normal rate, regular rhythm and normal heart sounds.  Pulmonary/Chest: poor air movement Abdominal: Soft. Bowel sounds are normal. He exhibits no distension. There is no tenderness.  Lymphadenopathy: He has no cervical adenopathy.  Neurological: He is alert and oriented to person, place, and time.  Skin: Skin is warm and dry. No rash noted. No erythema.  Psychiatric: He has a normal mood and affect. His behavior is normal.   LABS: Results for orders placed or performed during the hospital encounter of 11/17/16 (from the past 48 hour(s))  Glucose, capillary     Status: None   Collection Time: 11/17/16  4:44 PM  Result Value Ref Range   Glucose-Capillary 97 65 - 99 mg/dL  Glucose, capillary     Status: Abnormal   Collection Time: 11/17/16  9:26 PM  Result Value Ref Range   Glucose-Capillary 125 (H) 65 - 99 mg/dL  Basic metabolic panel     Status: Abnormal   Collection Time: 11/18/16  4:36 AM  Result Value Ref Range   Sodium 136 135 - 145 mmol/L   Potassium 4.2 3.5 - 5.1 mmol/L   Chloride 104 101 - 111 mmol/L   CO2 25 22 - 32 mmol/L   Glucose, Bld 101 (H) 65 - 99 mg/dL   BUN 18 6 - 20 mg/dL   Creatinine, Ser 1.09 0.61 - 1.24 mg/dL   Calcium 9.0 8.9 - 10.3 mg/dL   GFR calc non Af Amer >60 >60 mL/min   GFR calc Af Amer >60 >60 mL/min    Comment: (NOTE) The eGFR has been calculated using the CKD EPI equation. This calculation has not been validated in all clinical situations. eGFR's persistently <60 mL/min signify possible Chronic Kidney Disease.    Anion gap 7 5 - 15  CBC     Status: Abnormal   Collection Time: 11/18/16  4:36 AM  Result Value Ref Range   WBC 7.5 3.8 - 10.6 K/uL   RBC 4.36 (L) 4.40 - 5.90 MIL/uL   Hemoglobin 13.2 13.0 - 18.0 g/dL   HCT 39.2 (L) 40.0 - 52.0 %   MCV 89.8 80.0 - 100.0 fL   MCH 30.2 26.0 - 34.0 pg   MCHC 33.6 32.0 - 36.0 g/dL   RDW 15.4 (H) 11.5 - 14.5 %   Platelets 171 150 - 440 K/uL  Glucose, capillary      Status: None   Collection Time: 11/18/16  7:49 AM  Result Value Ref Range   Glucose-Capillary 98 65 - 99 mg/dL  Glucose, capillary     Status: Abnormal   Collection Time: 11/18/16 12:12 PM  Result Value Ref Range  Glucose-Capillary 161 (H) 65 - 99 mg/dL  Glucose, capillary     Status: None   Collection Time: 11/18/16  4:42 PM  Result Value Ref Range   Glucose-Capillary 96 65 - 99 mg/dL  Glucose, capillary     Status: Abnormal   Collection Time: 11/18/16  8:24 PM  Result Value Ref Range   Glucose-Capillary 128 (H) 65 - 99 mg/dL  Basic metabolic panel     Status: Abnormal   Collection Time: 11/19/16  4:52 AM  Result Value Ref Range   Sodium 137 135 - 145 mmol/L   Potassium 4.2 3.5 - 5.1 mmol/L   Chloride 105 101 - 111 mmol/L   CO2 25 22 - 32 mmol/L   Glucose, Bld 121 (H) 65 - 99 mg/dL   BUN 22 (H) 6 - 20 mg/dL   Creatinine, Ser 1.16 0.61 - 1.24 mg/dL   Calcium 9.5 8.9 - 10.3 mg/dL   GFR calc non Af Amer 58 (L) >60 mL/min   GFR calc Af Amer >60 >60 mL/min    Comment: (NOTE) The eGFR has been calculated using the CKD EPI equation. This calculation has not been validated in all clinical situations. eGFR's persistently <60 mL/min signify possible Chronic Kidney Disease.    Anion gap 7 5 - 15  Glucose, capillary     Status: Abnormal   Collection Time: 11/19/16  7:56 AM  Result Value Ref Range   Glucose-Capillary 109 (H) 65 - 99 mg/dL  Glucose, capillary     Status: Abnormal   Collection Time: 11/19/16 11:53 AM  Result Value Ref Range   Glucose-Capillary 125 (H) 65 - 99 mg/dL   No components found for: ESR, C REACTIVE PROTEIN MICRO: Recent Results (from the past 720 hour(s))  Microscopic Examination     Status: Abnormal   Collection Time: 10/21/16  3:43 PM  Result Value Ref Range Status   WBC, UA 11-30 (A) 0 - 5 /hpf Final   RBC, UA 0-2 0 - 2 /hpf Final   Epithelial Cells (non renal) 0-10 0 - 10 /hpf Final   Mucus, UA Present (A) Not Estab. Final   Bacteria, UA Few (A)  None seen/Few Final  CULTURE, URINE COMPREHENSIVE     Status: Abnormal   Collection Time: 10/21/16  4:43 PM  Result Value Ref Range Status   Urine Culture, Comprehensive Final report (A)  Final   Organism ID, Bacteria Enterococcus faecalis (A)  Final    Comment: Greater than 100,000 colony forming units per mL Note: this isolate is vancomycin-susceptible. This information is provided for epidemiologic purposes only: vancomycin is not among the antibiotics recommended for therapy of urinary tract infections caused by Enterococcus.    Organism ID, Bacteria Staphylococcus aureus (A)  Final    Comment: Greater than 100,000 colony forming units per mL Based on resistance to oxacillin this isolate would be resistant to all currently available beta-lactam antimicrobial agents, with the exception of the newer cephalosporins with anti-MRSA activity, such as Ceftaroline Methicillin resistant (MRSA)    ANTIMICROBIAL SUSCEPTIBILITY Comment  Final    Comment:       ** S = Susceptible; I = Intermediate; R = Resistant **                    P = Positive; N = Negative             MICS are expressed in micrograms per mL    Antibiotic  RSLT#1    RSLT#2    RSLT#3    RSLT#4 Ciprofloxacin                  R         R Gentamicin                               S Levofloxacin                   R         I Linezolid                                S Nitrofurantoin                 S         S Oxacillin                                R Penicillin                     S         R Rifampin                                 S Tetracycline                   R         S Trimethoprim/Sulfa                       S Vancomycin                     S         S   Urine Culture     Status: Abnormal (Preliminary result)   Collection Time: 11/17/16 10:57 AM  Result Value Ref Range Status   Specimen Description URINE, RANDOM  Final   Special Requests NONE  Final   Culture >=100,000 COLONIES/mL STAPHYLOCOCCUS  EPIDERMIDIS (A)  Final   Report Status PENDING  Incomplete    IMAGING: Dg Chest 2 View  Result Date: 11/14/2016 CLINICAL DATA:  Shortness of breath. EXAM: CHEST  2 VIEW COMPARISON:  None. FINDINGS: AICD noted in stable position. Prior cardiac valve replacement. Cardiomegaly. Volume loss and opacification right upper lobe again noted. Diffuse bilateral pulmonary infiltrates are again noted. No pleural effusion or pneumothorax P IMPRESSION: 1. Persistent atelectasis and opacification right upper lobe. Persistent diffuse bilateral pulmonary infiltrates. 2. AICD in stable position. Prior cardiac valve replacement. Stable cardiomegaly . Electronically Signed   By: Marcello Moores  Register   On: 11/14/2016 13:53   US Renal  Result Date: 11/17/2016 CLINICAL DATA:  Urinary tract infection. EXAM: RENAL / URINARY TRACT ULTRASOUND COMPLETE COMPARISON:  CT scan August 28, 2016. FINDINGS: Right Kidney: Length: 9.9 cm. 6 mm exophytic cyst is seen in lower pole. 1.2 cm simple cyst is seen in midpole. Echogenicity within normal limits. No mass or hydronephrosis visualized. Left Kidney: Length: 10.9 cm. Echogenicity within normal limits. No mass or hydronephrosis visualized. Bladder: Enlarged prostate gland is noted. Mild wall thickening of urinary bladder is noted which may be due to incomplete distention. However, cystitis cannot be excluded.  IMPRESSION: No significant renal abnormality is noted. Enlarged prostate gland is noted. Mild wall thickening of urinary bladder is noted which may be due to incomplete distention, but cystitis cannot be excluded. Electronically Signed   By: Marijo Conception, M.D.   On: 11/17/2016 16:26   Dg Chest Portable 1 View  Result Date: 11/17/2016 CLINICAL DATA:  Weakness and fatigue. EXAM: PORTABLE CHEST 1 VIEW COMPARISON:  11/14/2016 and 08/22/2016 and 03/02/2016 FINDINGS: Heart size and pulmonary vascularity are normal. There is extensive chronic partial opacification of the right hemithorax,  essentially unchanged since the prior study. No infiltrates or effusions on the left. AICD.  Prosthetic aortic valve.  Aortic atherosclerosis. IMPRESSION: No acute abnormalities. Extensive chronic changes in the right hemithorax. Electronically Signed   By: Lorriane Shire M.D.   On: 11/17/2016 12:23    Assessment:   Russell Howard is a 82 y.o. male with neurogenic bladder who CIC and now has UTI with Staph Epi Clinically improving with vanco. Spoke to micro and sensis pending.   Recommendations Continue vanco while inpatient Can dc on orals for 10-14 day course based on sensitivities. He requests antiemetic to take at home with meds as he does not tolerate well.  Thank you very much for allowing me to participate in the care of this patient. Please call with questions.   Cheral Marker. Ola Spurr, MD

## 2016-11-19 NOTE — Progress Notes (Signed)
Patient ID: Russell Howard, male   DOB: 27-Nov-1934, 82 y.o.   MRN: [id number]  [facility] PROGRESS NOTE  Russell Howard DPO:[id number] DOB: 01-16-1935 DOA: 11/17/2016 PCP: [name], MD  HPI/Subjective: Patient feeling a little bit better.  States he only has shortness of breath. Still feeling a little bit weak.  Objective: Vitals:   11/18/16 2022 11/19/16 0544  BP: (!) 103/54 100/63  Pulse: 98 93  Resp: 20 20  Temp: 97.9 F (36.6 C) 97.6 F (36.4 C)  SpO2: 95% 100%    Filed Weights   11/17/16 1055 11/17/16 1633  Weight: 77.1 kg (170 lb) 77.1 kg (169 lb 14.4 oz)    ROS: Review of Systems  Constitutional: Negative for chills and fever.  Eyes: Negative for blurred vision.  Respiratory: Positive for shortness of breath. Negative for cough.   Cardiovascular: Negative for chest pain.  Gastrointestinal: Negative for abdominal pain, constipation, diarrhea, nausea and vomiting.  Genitourinary: Negative for dysuria.  Musculoskeletal: Negative for joint pain.  Neurological: Positive for weakness. Negative for dizziness and headaches.   Exam: Physical Exam  Constitutional: He is oriented to person, place, and time.  HENT:  Nose: No mucosal edema.  Mouth/Throat: No oropharyngeal exudate or posterior oropharyngeal edema.  Eyes: Pupils are equal, round, and reactive to light. Conjunctivae, EOM and lids are normal.  Neck: No JVD present. Carotid bruit is not present. No edema present. No thyroid mass and no thyromegaly present.  Cardiovascular: S1 normal and S2 normal.  Exam reveals no gallop.   No murmur heard. Pulses:      Dorsalis pedis pulses are 2+ on the right side, and 2+ on the left side.  Respiratory: No respiratory distress. He has no wheezes. He has no rhonchi. He has no rales.  GI: Soft. Bowel sounds are normal. There is no tenderness.  Musculoskeletal:       Right ankle: He exhibits swelling.       Left ankle: He exhibits swelling.  Lymphadenopathy:    He  has no cervical adenopathy.  Neurological: He is alert and oriented to person, place, and time. No cranial nerve deficit.  Skin: Skin is warm. No rash noted. Nails show no clubbing.  Psychiatric: He has a normal mood and affect.      Data Reviewed: Basic Metabolic Panel:  Recent Labs Lab 11/14/16 1527 11/17/16 1057 11/18/16 0436 11/19/16 0452  NA 142 137 136 137  K 5.6* 4.0 4.2 4.2  CL 103 100* 104 105  CO2 27 25 25 25   GLUCOSE 122* 124* 101* 121*  BUN 28* 19 18 22*  CREATININE 1.22 1.01 1.09 1.16  CALCIUM 10.2 9.6 9.0 9.5   Liver Function Tests:  Recent Labs Lab 11/14/16 1527 11/17/16 1057  AST 21 29  ALT 12* 17  ALKPHOS 70 80  BILITOT 0.7 1.2  PROT 8.8* 8.9*  ALBUMIN 4.0 3.9   CBC:  Recent Labs Lab 11/17/16 1057 11/18/16 0436  WBC 9.0 7.5  HGB 14.0 13.2  HCT 42.2 39.2*  MCV 89.4 89.8  PLT 200 171   Cardiac Enzymes:  Recent Labs Lab 11/17/16 1057  TROPONINI <0.03   BNP (last 3 results)  Recent Labs  08/19/16 0553  BNP 179.0*     CBG:  Recent Labs Lab 11/18/16 1212 11/18/16 1642 11/18/16 2024 11/19/16 0756 11/19/16 1153  GLUCAP 161* 96 128* 109* 125*     Studies: US Renal  Result Date: 11/17/2016 CLINICAL DATA:  Urinary tract infection. EXAM: RENAL /  URINARY TRACT ULTRASOUND COMPLETE COMPARISON:  CT scan August 28, 2016. FINDINGS: Right Kidney: Length: 9.9 cm. 6 mm exophytic cyst is seen in lower pole. 1.2 cm simple cyst is seen in midpole. Echogenicity within normal limits. No mass or hydronephrosis visualized. Left Kidney: Length: 10.9 cm. Echogenicity within normal limits. No mass or hydronephrosis visualized. Bladder: Enlarged prostate gland is noted. Mild wall thickening of urinary bladder is noted which may be due to incomplete distention. However, cystitis cannot be excluded. IMPRESSION: No significant renal abnormality is noted. Enlarged prostate gland is noted. Mild wall thickening of urinary bladder is noted which may be due  to incomplete distention, but cystitis cannot be excluded. Electronically Signed   By: Marijo Conception, M.D.   On: 11/17/2016 16:26    Scheduled Meds: . acidophilus  1 capsule Oral Daily  . aspirin EC  81 mg Oral Daily  . digoxin  250 mcg Oral Daily  . [START ON 11/20/2016] furosemide  20 mg Oral Daily  . gabapentin  600 mg Oral BID  . heparin  5,000 Units Subcutaneous Q8H  . hydrocortisone  10 mg Oral BID  . insulin aspart  0-9 Units Subcutaneous TID WC  . levothyroxine  50 mcg Oral QAC breakfast  . magnesium oxide  400 mg Oral Daily  . midodrine  10 mg Oral BID WC  . multivitamin with minerals  1 tablet Oral Daily  . pantoprazole  40 mg Oral QAC breakfast  . simvastatin  20 mg Oral q1800   Continuous Infusions: . vancomycin Stopped (11/19/16 1011)    Assessment/Plan:  1. Recurrent urinary tract infections. His risk is straight catheterizations. Urine culture growing staph epidermidis. Case discussed with Dr. Ola Spurr infectious disease and he would like to wait for sensitivities. Continue IV vancomycin. Potential disposition tomorrow afternoon 2. Chronic respiratory failure on oxygen 3. Chronic mid range congestive heart failure. I stopped IV fluids. And restart Lasix tomorrow. 4. History of lung cancer 5. Hypothyroidism unspecified on levothyroxine 6. Hyperlipidemia unspecified on simvastatin 7. GERD on Protonix 8. Weakness physical therapy evaluation  Code Status:     Code Status Orders        Start     Ordered   11/17/16 6294  Do not attempt resuscitation (DNR)  Continuous    Question Answer Comment  In the event of cardiac or respiratory ARREST Do not call a "code blue"   In the event of cardiac or respiratory ARREST Do not perform Intubation, CPR, defibrillation or ACLS   In the event of cardiac or respiratory ARREST Use medication by any route, position, wound care, and other measures to relive pain and suffering. May use oxygen, suction and manual treatment of  airway obstruction as needed for comfort.      11/17/16 1631    Code Status History    Date Active Date Inactive Code Status Order ID Comments User Context   08/19/2016  1:15 PM 08/30/2016  5:28 PM DNR [id number]  Harrie Foreman, MD Inpatient   03/02/2016 10:36 PM 03/05/2016  8:20 PM DNR [id number]  Mikael Spray, NP ED   01/09/2016  6:58 AM 01/14/2016  7:39 PM DNR [id number]  Harrie Foreman, MD Inpatient   11/09/2015  3:18 AM 11/12/2015  5:13 PM DNR [id number]  Harrie Foreman, MD Inpatient   06/14/2015  4:39 PM 06/18/2015  3:13 PM DNR [id number]  Demetrios Loll, MD Inpatient    Advance Directive Documentation     Most Recent Value  Type  of Pension scheme manager Power of Attorney, Living will  Pre-existing out of facility DNR order (yellow form or pink MOST form)  -  "MOST" Form in Place?  -     Disposition Plan: home once urine culture resulted  Antibiotics:  Vancomycin  Time spent: 26 minutes  [place], [facility]           Patient ID: Russell Howard, male   DOB: 1934-03-30, 82 y.o.   MRN: [id number]

## 2016-11-20 LAB — URINE CULTURE

## 2016-11-20 LAB — GLUCOSE, CAPILLARY
GLUCOSE-CAPILLARY: 113 mg/dL — AB (ref 65–99)
GLUCOSE-CAPILLARY: 131 mg/dL — AB (ref 65–99)

## 2016-11-20 LAB — PROTEIN ELECTROPHORESIS, SERUM
A/G RATIO SPE: 0.7 (ref 0.7–1.7)
Albumin ELP: 3 g/dL (ref 2.9–4.4)
Alpha-1-Globulin: 0.2 g/dL (ref 0.0–0.4)
Alpha-2-Globulin: 0.9 g/dL (ref 0.4–1.0)
Beta Globulin: 0.9 g/dL (ref 0.7–1.3)
GLOBULIN, TOTAL: 4.1 g/dL — AB (ref 2.2–3.9)
Gamma Globulin: 2.1 g/dL — ABNORMAL HIGH (ref 0.4–1.8)
Total Protein ELP: 7.1 g/dL (ref 6.0–8.5)

## 2016-11-20 LAB — VANCOMYCIN, TROUGH: Vancomycin Tr: 13 ug/mL — ABNORMAL LOW (ref 15–20)

## 2016-11-20 MED ORDER — GABAPENTIN 600 MG PO TABS
600.0000 mg | ORAL_TABLET | Freq: Two times a day (BID) | ORAL | Status: DC
  Administered 2016-11-20: 600 mg via ORAL
  Filled 2016-11-20: qty 1

## 2016-11-20 MED ORDER — DOXYCYCLINE HYCLATE 100 MG PO TABS: 100.0000 mg | ORAL_TABLET | Freq: Two times a day (BID) | ORAL | 0 refills | Status: DC

## 2016-11-20 MED ORDER — DOXYCYCLINE HYCLATE 100 MG PO TABS: 100.0000 mg | ORAL_TABLET | Freq: Two times a day (BID) | ORAL | Status: DC

## 2016-11-20 NOTE — Progress Notes (Signed)
Pharmacy Antibiotic Note Russell Howard is a 82 y.o. male admitted on 11/17/2016 with UTI.  Pharmacy has been consulted for Ceftriaxone and vancomycin dosing.  Patient's last urine Cx from 9/18 show MRSA and E. Faecalis.   Plan: Patient received vancomycin 1g IV and ceftriaxone 1g in ED.   Ke: 0.057   T1/2: 12.2   Vd: 53.9 Will start Vancomycin 1250mg   IV every 18 hours with 6 hour stack dosing.  Goal trough 10-15 mcg/mL. Trough level ordered prior to 4th dose. Estimated trough at Css is 13.7.  Start Ceftriaxone 1g IV every 24 hours.    Height: 5\' 10"  (177.8 cm) Weight: 169 lb 14.4 oz (77.1 kg) IBW/kg (Calculated) : 73  Temp (24hrs), Avg:97.8 F (36.6 C), Min:97.6 F (36.4 C), Max:98 F (36.7 C)   Recent Labs Lab 11/14/16 1527 11/17/16 1057 11/18/16 0436 11/19/16 0452 11/20/16 0355  WBC  --  9.0 7.5  --   --   CREATININE 1.22 1.01 1.09 1.16  --   VANCOTROUGH  --   --   --   --  13*    Estimated Creatinine Clearance: 53.3 mL/min (by C-G formula based on SCr of 1.16 mg/dL).    Allergies  Allergen Reactions  . Penicillin G Hives    Has patient had a PCN reaction causing immediate rash, facial/tongue/throat swelling, SOB or lightheadedness with hypotension: Yes Has patient had a PCN reaction causing severe rash involving mucus membranes or skin necrosis: No Has patient had a PCN reaction that required hospitalization No Has patient had a PCN reaction occurring within the last 10 years: No If all of the above answers are "NO", then may proceed with Cephalosporin use.  . Sulfa Antibiotics Rash    Antimicrobials this admission: 10/15 vancomycin >>  10/15 ceftriaxone >>  Dose adjustments this admission: 10/18 0400 vanc level 13. Continue current regimen.  Microbiology results: 10/15 UCx: pending  Thank you for allowing pharmacy to be a part of this patient's care.  Eloise Harman, PharmD, BCPS Clinical Pharmacist 11/20/2016 5:00 AM

## 2016-11-20 NOTE — Progress Notes (Signed)
ID E note  Staph epi S to doxy, and bactrim but R to clinda, oxacillin. Pt allergic to sulfa and PCN.  Would rec doxy 100 bid for 10 days.

## 2016-11-20 NOTE — Progress Notes (Signed)
Medications administered by student RN [phone] with supervision of Clinical Instructor Lynann Bologna MSN, RN-BC or patient's assigned RN. Student provided education related to UTI and Self-cath patient at discharge as well.

## 2016-11-20 NOTE — Progress Notes (Signed)
MD order received to discharge pt home with home health today; Care Management previously established home health PT with [facility]; AVS reviewed with pt; pt discharged via wheelchair by nursing to the visitor's entrance

## 2016-11-20 NOTE — Plan of Care (Signed)
Problem: Education: Goal: Knowledge of [facility] Education information/materials will improve Outcome: Progressing VSS, free of falls during shift.  Denies pain.  No needs overnight.  Bed in low position, call bell within reach.  WCTM.

## 2016-11-20 NOTE — Care Management (Addendum)
Discharge to home today per Dr. Leslye Peer. Will be followed by [facility] in the home. Discussed services in the home. May omit RN and aide services per Dr. Leslye Peer.  Daughter will transport. Shelbie Ammons RN MSN CCM Care Management [phone]

## 2016-11-20 NOTE — Discharge Summary (Signed)
[facility] NAME: Russell Howard    MR#:  [id number]  DATE OF BIRTH:  08-17-1934  DATE OF ADMISSION:  11/17/2016 ADMITTING PHYSICIAN: Vaughan Basta, MD  DATE OF DISCHARGE: 11/20/2016 12:47 PM  PRIMARY CARE PHYSICIAN: [name], MD    ADMISSION DIAGNOSIS:  UTI (urinary tract infection) [N39.0] Generalized weakness [R53.1] Urinary tract infection without hematuria, site unspecified [N39.0]  DISCHARGE DIAGNOSIS:  Active Problems:   Acute lower UTI   UTI (urinary tract infection)   SECONDARY DIAGNOSIS:   Past Medical History:  Diagnosis Date  . Acquired hypothyroidism 11/02/2014  . BPH (benign prostatic hyperplasia)   . Cardiac defibrillator in place 11/02/2014  . Cardiomyopathy ([place])   . CHF (congestive heart failure) ([place])   . Chronic diastolic heart failure ([place]) 11/02/2014  . COPD (chronic obstructive pulmonary disease) ([place])   . Gastro-esophageal reflux disease without esophagitis 11/02/2014  . H/O malignant neoplasm 11/09/2014  . Heart valve disease 11/02/2014  . History of atrial fibrillation 11/02/2014  . Lung cancer ([place])   . Neuropathy 11/02/2014  . Orthostasis 11/02/2014  . Pure hypercholesterolemia 11/02/2014  . Valvular heart disease     HOSPITAL COURSE:   1. Recurrent urinary tract infections. His risk is straight catheterization. Urine culture growing staph epidermidis. Patient was on initially on vancomycin and Rocephin. Rocephin was discontinued. We kept the patient on vancomycin until sensitivities came back. It is sensitive to doxycycline. A total of 10 day course was continued. 2. Chronic respiratory failure on oxygen 3. Chronic mid range congestive heart failure. I stopped IV fluids. Restart Lasix tomorrow. This is not an acute exacerbation. 4. History of lung cancer 5. Hypothyroidism unspecified on levothyroxin 6. Hyperlipidemia unspecified on simvastatin 7. GERD on Protonix 8. Physical  therapy recommended home with home health. Home health set up area  DISCHARGE CONDITIONS:   Satisfactory  CONSULTS OBTAINED:  Treatment Team:  Leonel Ramsay, MD  DRUG ALLERGIES:   Allergies  Allergen Reactions  . Penicillin G Hives    Has patient had a PCN reaction causing immediate rash, facial/tongue/throat swelling, SOB or lightheadedness with hypotension: Yes Has patient had a PCN reaction causing severe rash involving mucus membranes or skin necrosis: No Has patient had a PCN reaction that required hospitalization No Has patient had a PCN reaction occurring within the last 10 years: No If all of the above answers are "NO", then may proceed with Cephalosporin use.  . Sulfa Antibiotics Rash    DISCHARGE MEDICATIONS:   Discharge Medication List as of 11/20/2016 11:46 AM    START taking these medications   Details  doxycycline (VIBRA-TABS) 100 MG tablet Take 1 tablet (100 mg total) by mouth every 12 (twelve) hours., Starting Thu 11/20/2016, Print      CONTINUE these medications which have NOT CHANGED   Details  acidophilus (RISAQUAD) CAPS capsule Take 1 capsule by mouth daily., Historical Med    Ascorbic Acid (VITAMIN C) 1000 MG tablet Take 500 mg by mouth 3 (three) times daily. Reported on 06/14/2015, Historical Med    aspirin EC 81 MG tablet Take 81 mg by mouth daily. , Historical Med    Cholecalciferol 5000 units TABS Take 5,000 Units by mouth every morning., Historical Med    Coenzyme Q10 (TH CO Q-10) 100 MG capsule Take 100 mg by mouth daily. , Historical Med    DIGOX 250 MCG tablet Take 1 tablet (250 mcg total) by mouth daily., Starting Sat 08/30/2016, No  Print    gabapentin (NEURONTIN) 600 MG tablet Take 600 mg by mouth 2 (two) times daily. , Historical Med    hydrocortisone (CORTEF) 10 MG tablet Take 10 mg by mouth 2 (two) times daily. , Historical Med    levothyroxine (SYNTHROID, LEVOTHROID) 50 MCG tablet Take 50 mcg by mouth daily before breakfast.,  Historical Med    MAGNESIUM-OXIDE 400 (241.3 Mg) MG tablet Take 400 mg by mouth daily., Historical Med    methenamine (HIPREX) 1 g tablet TAKE 1 TABLET BY MOUTH TWICE DAILY WITH MEALS, Normal    midodrine (PROAMATINE) 10 MG tablet Take 1 tablet (10 mg total) by mouth 3 (three) times daily with meals., Starting Sat 08/30/2016, Normal    Multiple Vitamin (MULTIVITAMIN WITH MINERALS) TABS tablet Take 1 tablet by mouth daily., Historical Med    pantoprazole (PROTONIX) 40 MG tablet Take 40 mg by mouth daily before breakfast. , Historical Med    SAW PALMETTO-PUMPKIN SEED OIL PO Take 1 capsule by mouth daily. , Historical Med    simvastatin (ZOCOR) 20 MG tablet Take 20 mg by mouth daily at 6 PM. , Historical Med    vitamin B-12 (CYANOCOBALAMIN) 500 MCG tablet Take 500 mcg by mouth daily. , Historical Med    vitamin E 400 UNIT capsule Take 400 Units by mouth daily. , Historical Med    docusate sodium (COLACE) 100 MG capsule Take 1 capsule (100 mg total) by mouth 2 (two) times daily., Starting Sat 08/30/2016, Until Sun 08/30/2017, Normal    furosemide (LASIX) 20 MG tablet Take 20 mg by mouth every other day as needed (as needed for water retention). , Starting Thu 05/10/2015, Historical Med    guaiFENesin-dextromethorphan (ROBITUSSIN DM) 100-10 MG/5ML syrup Take 5 mLs by mouth every 4 (four) hours as needed for cough., Starting Wed 08/27/2016, Normal    ipratropium-albuterol (DUONEB) 0.5-2.5 (3) MG/3ML SOLN Take 3 mLs by nebulization every 6 (six) hours as needed., Starting Thu 06/12/2016, Normal    ondansetron (ZOFRAN ODT) 4 MG disintegrating tablet Take 1 tablet (4 mg total) by mouth every 8 (eight) hours as needed for nausea or vomiting., Starting Sat 08/30/2016, Print    potassium chloride (MICRO-K) 10 MEQ CR capsule Take 10 mEq by mouth daily as needed (only with lasix). , Historical Med      STOP taking these medications     levofloxacin (LEVAQUIN) 500 MG tablet      nitrofurantoin,  macrocrystal-monohydrate, (MACROBID) 100 MG capsule          DISCHARGE INSTRUCTIONS:   Follow-up PMD one week  If you experience worsening of your admission symptoms, develop shortness of breath, life threatening emergency, suicidal or homicidal thoughts you must seek medical attention immediately by calling 911 or calling your MD immediately  if symptoms less severe.  You Must read complete instructions/literature along with all the possible adverse reactions/side effects for all the Medicines you take and that have been prescribed to you. Take any new Medicines after you have completely understood and accept all the possible adverse reactions/side effects.   Please note  You were cared for by a hospitalist during your hospital stay. If you have any questions about your discharge medications or the care you received while you were in the hospital after you are discharged, you can call the unit and asked to speak with the hospitalist on call if the hospitalist that took care of you is not available. Once you are discharged, your primary care physician will handle any  further medical issues. Please note that NO REFILLS for any discharge medications will be authorized once you are discharged, as it is imperative that you return to your primary care physician (or establish a relationship with a primary care physician if you do not have one) for your aftercare needs so that they can reassess your need for medications and monitor your lab values.    Today   CHIEF COMPLAINT:   Chief Complaint  Patient presents with  . Weakness    HISTORY OF PRESENT ILLNESS:  Russell Howard  is a 82 y.o. male came in with weakness   VITAL SIGNS:  Blood pressure 112/70, pulse (!) 102, temperature 98.8 F (37.1 C), temperature source Oral, resp. rate (!) 21, height 5\' 10"  (1.778 m), weight 81.6 kg (180 lb), SpO2 100 %.  Patient's respirations were 16 when I saw him  PHYSICAL EXAMINATION:  GENERAL:  82  y.o.-year-old patient lying in the bed with no acute distress.  EYES: Pupils equal, round, reactive to light and accommodation. No scleral icterus. Extraocular muscles intact.  HEENT: Head atraumatic, normocephalic. Oropharynx and nasopharynx clear.  NECK:  Supple, no jugular venous distention. No thyroid enlargement, no tenderness.  LUNGS: Decreased breath sounds bilaterally, no wheezing, rales,rhonchi or crepitation. No use of accessory muscles of respiration.  CARDIOVASCULAR: S1, S2 normal. No murmurs, rubs, or gallops.  ABDOMEN: Soft, non-tender, non-distended. Bowel sounds present. No organomegaly or mass.  EXTREMITIES: Trace edema, no cyanosis, or clubbing.  NEUROLOGIC: Cranial nerves II through XII are intact. Muscle strength 5/5 in all extremities. Sensation intact. Gait not checked.  PSYCHIATRIC: The patient is alert and oriented x 3.  SKIN: No obvious rash, lesion, or ulcer.   DATA REVIEW:   CBC  Recent Labs Lab 11/18/16 0436  WBC 7.5  HGB 13.2  HCT 39.2*  PLT 171    Chemistries   Recent Labs Lab 11/17/16 1057  11/19/16 0452  NA 137  < > 137  K 4.0  < > 4.2  CL 100*  < > 105  CO2 25  < > 25  GLUCOSE 124*  < > 121*  BUN 19  < > 22*  CREATININE 1.01  < > 1.16  CALCIUM 9.6  < > 9.5  AST 29  --   --   ALT 17  --   --   ALKPHOS 80  --   --   BILITOT 1.2  --   --   < > = values in this interval not displayed.  Cardiac Enzymes  Recent Labs Lab 11/17/16 1057  TROPONINI <0.03    Microbiology Results  Results for orders placed or performed during the hospital encounter of 11/17/16  Urine Culture     Status: Abnormal   Collection Time: 11/17/16 10:57 AM  Result Value Ref Range Status   Specimen Description URINE, RANDOM  Final   Special Requests NONE  Final   Culture >=100,000 COLONIES/mL STAPHYLOCOCCUS EPIDERMIDIS (A)  Final   Report Status 11/20/2016 FINAL  Final   Organism ID, Bacteria STAPHYLOCOCCUS EPIDERMIDIS (A)  Final      Susceptibility    Staphylococcus epidermidis - MIC*    CIPROFLOXACIN >=8 RESISTANT Resistant     GENTAMICIN <=0.5 SENSITIVE Sensitive     NITROFURANTOIN <=16 SENSITIVE Sensitive     OXACILLIN >=4 RESISTANT Resistant     TETRACYCLINE 2 SENSITIVE Sensitive     VANCOMYCIN 1 SENSITIVE Sensitive     TRIMETH/SULFA 20 SENSITIVE Sensitive     CLINDAMYCIN >=8  RESISTANT Resistant     RIFAMPIN <=0.5 SENSITIVE Sensitive     Inducible Clindamycin NEGATIVE Sensitive     * >=100,000 COLONIES/mL STAPHYLOCOCCUS EPIDERMIDIS     Management plans discussed with the patient, family and they are in agreement.  CODE STATUS:     Code Status Orders        Start     Ordered   11/17/16 8891  Do not attempt resuscitation (DNR)  Continuous    Question Answer Comment  In the event of cardiac or respiratory ARREST Do not call a "code blue"   In the event of cardiac or respiratory ARREST Do not perform Intubation, CPR, defibrillation or ACLS   In the event of cardiac or respiratory ARREST Use medication by any route, position, wound care, and other measures to relive pain and suffering. May use oxygen, suction and manual treatment of airway obstruction as needed for comfort.      11/17/16 1631    Code Status History    Date Active Date Inactive Code Status Order ID Comments User Context   08/19/2016  1:15 PM 08/30/2016  5:28 PM DNR [id number]  Harrie Foreman, MD Inpatient   03/02/2016 10:36 PM 03/05/2016  8:20 PM DNR [id number]  Mikael Spray, NP ED   01/09/2016  6:58 AM 01/14/2016  7:39 PM DNR [id number]  Harrie Foreman, MD Inpatient   11/09/2015  3:18 AM 11/12/2015  5:13 PM DNR [id number]  Harrie Foreman, MD Inpatient   06/14/2015  4:39 PM 06/18/2015  3:13 PM DNR [id number]  Demetrios Loll, MD Inpatient    Advance Directive Documentation     Most Recent Value  Type of Advance Directive  Healthcare Power of [place], Living will  Pre-existing out of facility DNR order (yellow form or pink MOST form)  -  "MOST" Form in  Place?  -      TOTAL TIME TAKING CARE OF THIS PATIENT: 35 minutes.    Loletha Grayer M.D on 11/20/2016 at 3:41 PM  Between 7am to 6pm - Pager - [phone]  After 6pm go to [email or web address] - password [email or web address] [facility]  [phone]  CC: Primary care physician; [name], MD

## 2016-11-20 NOTE — Discharge Instructions (Signed)
[facility]

## 2017-02-13 ENCOUNTER — Other Ambulatory Visit: Payer: Self-pay | Admitting: Internal Medicine

## 2017-02-13 DIAGNOSIS — R55 Syncope and collapse: Secondary | ICD-10-CM

## 2017-02-18 ENCOUNTER — Other Ambulatory Visit: Payer: Medicare Other | Admitting: Urology

## 2017-02-24 ENCOUNTER — Ambulatory Visit (INDEPENDENT_AMBULATORY_CARE_PROVIDER_SITE_OTHER): Payer: Medicare Other | Admitting: Urology

## 2017-02-24 ENCOUNTER — Encounter: Payer: Self-pay | Admitting: Urology

## 2017-02-24 VITALS — BP 113/65 | HR 110 | Ht 70.0 in | Wt 170.0 lb

## 2017-02-24 DIAGNOSIS — Z87448 Personal history of other diseases of urinary system: Secondary | ICD-10-CM

## 2017-02-24 DIAGNOSIS — N390 Urinary tract infection, site not specified: Secondary | ICD-10-CM

## 2017-02-24 DIAGNOSIS — R339 Retention of urine, unspecified: Secondary | ICD-10-CM | POA: Diagnosis not present

## 2017-02-24 LAB — URINALYSIS, COMPLETE
Bilirubin, UA: NEGATIVE
GLUCOSE, UA: NEGATIVE
Ketones, UA: NEGATIVE
NITRITE UA: NEGATIVE
PH UA: 5.5 (ref 5.0–7.5)
Specific Gravity, UA: 1.025 (ref 1.005–1.030)
UUROB: 0.2 mg/dL (ref 0.2–1.0)

## 2017-02-24 LAB — MICROSCOPIC EXAMINATION

## 2017-02-24 MED ORDER — LIDOCAINE HCL 2 % EX GEL
1.0000 "application " | Freq: Once | CUTANEOUS | Status: AC
  Administered 2017-02-24: 1 via URETHRAL

## 2017-02-24 MED ORDER — CIPROFLOXACIN HCL 500 MG PO TABS
500.0000 mg | ORAL_TABLET | Freq: Once | ORAL | Status: AC
  Administered 2017-02-24: 500 mg via ORAL

## 2017-02-24 NOTE — Progress Notes (Signed)
   02/24/17  CC:  Chief Complaint  Patient presents with  . Cysto    HPI: 83 year old male with a history of chronic urinary retention managed with clean intermittent catheterization twice daily presents today for annual cystoscopy.  He was admitted in October 2019 with another urinary tract infection.  This was his only admission over the past year specifically for urinary tract infection.  He has had several other admissions for pneumonia/respiratory issues.  He continues to take hipprex for prevention.  His daughter again notes today that when he does develop a urinary tract infection, he becomes quite ill very quickly and has very few symptoms of UTI bladder.  Last week, she felt that he may be developing a UTI road 10,000 colonies of mixed flora.  No evidence of UTI.  UA appeared very similar to today's.  He does also have a history of urethral stricture office dilation in 2016.  He has been managing this with self cath.  Blood pressure 113/65, pulse (!) 110, height 5\' 10"  (1.778 m), weight 170 lb (77.1 kg). NED. A&Ox3.   No respiratory distress   Abd soft, NT, ND Normal phallus with bilateral descended testicles  Cystoscopy Procedure Note  Patient identification was confirmed, informed consent was obtained, and patient was prepped using Betadine solution.  Lidocaine jelly was administered per urethral meatus.    Preoperative abx where received prior to procedure.     Pre-Procedure: - Inspection reveals a normal caliber ureteral meatus.  Procedure: The flexible cystoscope was introduced without difficulty - No urethral strictures/lesions are present.  There is a small narrowed area in the bulbar urethra consistent with previous history of urethral stricture but easily passable with a 16 French flexible scope. - Normal prostate with coapting lateral lobes - Normal bladder neck - Bilateral ureteral orifices identified - Bladder mucosa  reveals no ulcers, tumors, or  lesions.  There is mild debris easily throughout the bladder. - No bladder stones -Mild trabeculation  Retroflexion shows unremarkable without evidence of intravesical protrusion   Post-Procedure: - Patient tolerated the procedure well  Assessment/ Plan:  1. Recurrent UTI Doing well on Hipprex, continue for UTI prevention Single isolated admission this year for UTI which is improved from previous years Continue to treat as needed for symptomatic infections Reviewed between colonization and infection again today - Urinalysis, Complete - ciprofloxacin (CIPRO) tablet 500 mg - lidocaine (XYLOCAINE) 2 % jelly 1 application  2. Urinary retention Managed with CI with twice daily Increase frequency as needed  3. History of urethral stricture As above  Return in about 1 year (around 02/24/2018) for cysto.  Hollice Espy, MD

## 2017-02-25 ENCOUNTER — Ambulatory Visit
Admission: RE | Admit: 2017-02-25 | Discharge: 2017-02-25 | Disposition: A | Discharge status: Home / Self Care | Payer: Medicare Other | Source: Ambulatory Visit | Attending: Internal Medicine | Admitting: Internal Medicine

## 2017-02-25 DIAGNOSIS — G319 Degenerative disease of nervous system, unspecified: Secondary | ICD-10-CM | POA: Insufficient documentation

## 2017-02-25 DIAGNOSIS — R55 Syncope and collapse: Secondary | ICD-10-CM

## 2017-02-25 DIAGNOSIS — I6523 Occlusion and stenosis of bilateral carotid arteries: Secondary | ICD-10-CM | POA: Insufficient documentation

## 2017-02-25 DIAGNOSIS — I6782 Cerebral ischemia: Secondary | ICD-10-CM | POA: Insufficient documentation

## 2017-03-23 ENCOUNTER — Telehealth: Payer: Self-pay

## 2017-03-23 NOTE — Telephone Encounter (Signed)
[employer] has called stating pt is requesting to increase CIC to 4x day. I noticed in your dictation can increase. Are you ok with going from 2x to 4x daily?

## 2017-03-23 NOTE — Telephone Encounter (Signed)
Made Christina at [facility] aware can increase CIC 4x daily.

## 2017-03-23 NOTE — Telephone Encounter (Signed)
Yes, increase as needed.  Russell Espy, MD

## 2017-03-24 ENCOUNTER — Emergency Department: Payer: Medicare Other

## 2017-03-24 ENCOUNTER — Other Ambulatory Visit: Payer: Self-pay

## 2017-03-24 ENCOUNTER — Emergency Department
Admission: EM | Admit: 2017-03-24 | Discharge: 2017-03-24 | Disposition: A | Discharge status: Home / Self Care | Payer: Medicare Other | Attending: Emergency Medicine | Admitting: Emergency Medicine

## 2017-03-24 ENCOUNTER — Encounter: Payer: Self-pay | Admitting: Emergency Medicine

## 2017-03-24 DIAGNOSIS — K5900 Constipation, unspecified: Secondary | ICD-10-CM | POA: Diagnosis not present

## 2017-03-24 DIAGNOSIS — E860 Dehydration: Secondary | ICD-10-CM | POA: Diagnosis not present

## 2017-03-24 DIAGNOSIS — I5032 Chronic diastolic (congestive) heart failure: Secondary | ICD-10-CM | POA: Insufficient documentation

## 2017-03-24 DIAGNOSIS — R05 Cough: Secondary | ICD-10-CM | POA: Insufficient documentation

## 2017-03-24 DIAGNOSIS — Z79899 Other long term (current) drug therapy: Secondary | ICD-10-CM | POA: Diagnosis not present

## 2017-03-24 DIAGNOSIS — J449 Chronic obstructive pulmonary disease, unspecified: Secondary | ICD-10-CM | POA: Insufficient documentation

## 2017-03-24 DIAGNOSIS — R059 Cough, unspecified: Secondary | ICD-10-CM

## 2017-03-24 DIAGNOSIS — Z95810 Presence of automatic (implantable) cardiac defibrillator: Secondary | ICD-10-CM | POA: Insufficient documentation

## 2017-03-24 DIAGNOSIS — Z87891 Personal history of nicotine dependence: Secondary | ICD-10-CM | POA: Insufficient documentation

## 2017-03-24 DIAGNOSIS — R1030 Lower abdominal pain, unspecified: Secondary | ICD-10-CM | POA: Diagnosis present

## 2017-03-24 LAB — URINALYSIS, COMPLETE (UACMP) WITH MICROSCOPIC
Bacteria, UA: NONE SEEN
Bilirubin Urine: NEGATIVE
GLUCOSE, UA: NEGATIVE mg/dL
HGB URINE DIPSTICK: NEGATIVE
Ketones, ur: NEGATIVE mg/dL
Leukocytes, UA: NEGATIVE
NITRITE: NEGATIVE
Protein, ur: NEGATIVE mg/dL
SPECIFIC GRAVITY, URINE: 1.019 (ref 1.005–1.030)
pH: 5 (ref 5.0–8.0)

## 2017-03-24 LAB — CBC
HCT: 42.4 % (ref 40.0–52.0)
Hemoglobin: 14.2 g/dL (ref 13.0–18.0)
MCH: 31.1 pg (ref 26.0–34.0)
MCHC: 33.6 g/dL (ref 32.0–36.0)
MCV: 92.5 fL (ref 80.0–100.0)
PLATELETS: 216 10*3/uL (ref 150–440)
RBC: 4.58 MIL/uL (ref 4.40–5.90)
RDW: 16 % — AB (ref 11.5–14.5)
WBC: 7.9 10*3/uL (ref 3.8–10.6)

## 2017-03-24 LAB — COMPREHENSIVE METABOLIC PANEL
ALBUMIN: 3.9 g/dL (ref 3.5–5.0)
ALK PHOS: 87 U/L (ref 38–126)
ALT: 23 U/L (ref 17–63)
AST: 47 U/L — AB (ref 15–41)
Anion gap: 13 (ref 5–15)
BUN: 27 mg/dL — AB (ref 6–20)
CALCIUM: 9.2 mg/dL (ref 8.9–10.3)
CO2: 23 mmol/L (ref 22–32)
Chloride: 100 mmol/L — ABNORMAL LOW (ref 101–111)
Creatinine, Ser: 0.88 mg/dL (ref 0.61–1.24)
GFR calc Af Amer: >60 mL/min (ref >60)
GFR calc non Af Amer: >60 mL/min (ref >60)
GLUCOSE: 145 mg/dL — AB (ref 65–99)
Potassium: 5.1 mmol/L (ref 3.5–5.1)
Sodium: 136 mmol/L (ref 135–145)
TOTAL PROTEIN: 8.2 g/dL — AB (ref 6.5–8.1)
Total Bilirubin: 1 mg/dL (ref 0.3–1.2)

## 2017-03-24 LAB — LIPASE, BLOOD: Lipase: 26 U/L (ref 11–51)

## 2017-03-24 LAB — LACTIC ACID, PLASMA: Lactic Acid, Venous: 1.6 mmol/L (ref 0.5–1.9)

## 2017-03-24 LAB — DIGOXIN LEVEL: Digoxin Level: 1.7 ng/mL (ref 0.8–2.0)

## 2017-03-24 MED ORDER — SODIUM CHLORIDE 0.9 % IV BOLUS (SEPSIS)
500.0000 mL | Freq: Once | INTRAVENOUS | Status: AC
  Administered 2017-03-24: 500 mL via INTRAVENOUS

## 2017-03-24 MED ORDER — IOPAMIDOL (ISOVUE-300) INJECTION 61%
30.0000 mL | Freq: Once | INTRAVENOUS | Status: AC | PRN
  Administered 2017-03-24: 30 mL via ORAL

## 2017-03-24 MED ORDER — ONDANSETRON HCL 4 MG/2ML IJ SOLN
4.0000 mg | Freq: Once | INTRAMUSCULAR | Status: AC | PRN
  Administered 2017-03-24: 4 mg via INTRAVENOUS
  Filled 2017-03-24: qty 2

## 2017-03-24 MED ORDER — IOPAMIDOL (ISOVUE-300) INJECTION 61%
100.0000 mL | Freq: Once | INTRAVENOUS | Status: AC | PRN
  Administered 2017-03-24: 100 mL via INTRAVENOUS

## 2017-03-24 MED ORDER — FENTANYL CITRATE (PF) 100 MCG/2ML IJ SOLN
50.0000 ug | Freq: Once | INTRAMUSCULAR | Status: AC
  Administered 2017-03-24: 50 ug via INTRAVENOUS
  Filled 2017-03-24: qty 2

## 2017-03-24 NOTE — Discharge Instructions (Signed)
Please take over-the-counter laxatives for your constipation return to the emergency room for any new or worrisome symptoms occluding fever, vomiting, abdominal pain or you feel worse in any way.

## 2017-03-24 NOTE — ED Notes (Signed)
Bladder scan performed, no more than 40ml retained

## 2017-03-24 NOTE — ED Notes (Signed)
Pt assisted to toilet in room, pt had large stool

## 2017-03-24 NOTE — ED Triage Notes (Signed)
Pt in via POV with complaints of abdominal pain, N/V since Sunday night.  Pt dry heaving in triage.  Pt on 2L nasal cannula at baseline.

## 2017-03-24 NOTE — ED Notes (Signed)
Pt

## 2017-03-24 NOTE — ED Provider Notes (Addendum)
[facility] Emergency Department Provider Note  ____________________________________________   I have reviewed the triage vital signs and the nursing notes. Where available I have reviewed prior notes and, if possible and indicated, outside hospital notes.    HISTORY  Chief Complaint Abdominal Pain    HPI Russell Howard is a 83 y.o. male multiple different medical problems including CHF chronic diastolic heart failure COPD, ESRD, history of neoplasm, self cathing, atrial fibrillation, lung cancer, neuropathy, defibrillator placement, septic shock in the past, family also states he has had SBO's, patient presents with abdominal pain in the lower abdomen which is been present now for the last 2-3 days, gradual onset on Sunday.  Vomited once.  Has had decreased stooling.  Feels generally weak.  Has no change in his chronic cough.  Denies fever.  Denies chills.  States is a diffuse nonfocal abdominal discomfort in the lower abdomen.  Nothing makes it better nothing makes it worse no antecedent treatment.  Patient feels generally weak is only had broth to eat today.  Still passing flatus  Past Medical History:  Diagnosis Date  . Acquired hypothyroidism 11/02/2014  . BPH (benign prostatic hyperplasia)   . Cardiac defibrillator in place 11/02/2014  . Cardiomyopathy ([place])   . CHF (congestive heart failure) ([place])   . Chronic diastolic heart failure ([place]) 11/02/2014  . COPD (chronic obstructive pulmonary disease) ([place])   . Gastro-esophageal reflux disease without esophagitis 11/02/2014  . H/O malignant neoplasm 11/09/2014  . Heart valve disease 11/02/2014  . History of atrial fibrillation 11/02/2014  . Lung cancer ([place])   . Neuropathy 11/02/2014  . Orthostasis 11/02/2014  . Pure hypercholesterolemia 11/02/2014  . Valvular heart disease     Patient Active Problem List   Diagnosis Date Noted  . UTI (urinary tract infection) 11/17/2016  . Empyema ([place])   . Weakness   .  Community acquired pneumonia   . Pleural effusion   . Palliative care by specialist   . Acute on chronic respiratory failure with hypoxia ([place]) 08/19/2016  . Acute respiratory failure with hypoxia ([place]) 08/19/2016  . Septic shock ([place]) 03/02/2016  . Hydronephrosis, right 01/14/2016  . Constipation 01/13/2016  . Urinary retention 01/13/2016  . Lower abdominal pain 01/13/2016  . Bacteremia 01/13/2016  . Hypotension 01/13/2016  . Palliative care encounter   . Goals of care, counseling/discussion   . Cough   . Encounter for hospice care discussion   . Hypoxia 01/09/2016  . Primary cancer of bronchus of right lower lobe ([place]) 11/29/2015  . Sepsis ([place]) 06/14/2015  . Acute lower UTI 06/14/2015    Class: Acute  . H/O malignant neoplasm 11/09/2014  . Acquired hypothyroidism 11/02/2014  . Cardiomyopathy ([place]) 11/02/2014  . Acute on chronic diastolic CHF (congestive heart failure) ([place]) 11/02/2014  . Diabetes mellitus without complication ([place]) 62/70/3500  . Cardiac defibrillator in place 11/02/2014  . Gastro-esophageal reflux disease without esophagitis 11/02/2014  . History of atrial fibrillation 11/02/2014  . BP (high blood pressure) 11/02/2014  . Neuropathy 11/02/2014  . Orthostasis 11/02/2014  . Pure hypercholesterolemia 11/02/2014  . Heart valve disease 11/02/2014    Past Surgical History:  Procedure Laterality Date  . APPENDECTOMY    . DUAL ICD IMPLANT  2007/2009   implantable cardiac defibrillator  . PORT-A-CATH REMOVAL      Prior to Admission medications   Medication Sig Start Date End Date Taking? Authorizing Provider  acidophilus (RISAQUAD) CAPS capsule Take 1 capsule by mouth daily.   Yes [provider]  Ascorbic Acid (VITAMIN C) 1000 MG tablet Take 500 mg by mouth 3 (three) times daily. Reported on 06/14/2015   Yes [provider]  aspirin EC 81 MG tablet Take 81 mg by mouth daily.    Yes [provider]  Cholecalciferol 5000 units TABS  Take 5,000 Units by mouth every morning.   Yes [provider]  Coenzyme Q10 (TH CO Q-10) 100 MG capsule Take 100 mg by mouth daily.    Yes [provider]  [place] 250 MCG tablet Take 1 tablet (250 mcg total) by mouth daily. 08/30/16  Yes Sudini, Alveta Heimlich, MD  furosemide (LASIX) 20 MG tablet Take 20 mg by mouth every other day as needed (as needed for water retention).  05/10/15  Yes [provider]  gabapentin (NEURONTIN) 600 MG tablet Take 600 mg by mouth 2 (two) times daily.    Yes [provider]  guaiFENesin-dextromethorphan (ROBITUSSIN DM) 100-10 MG/5ML syrup Take 5 mLs by mouth every 4 (four) hours as needed for cough. 08/27/16  Yes Sudini, Alveta Heimlich, MD  hydrocortisone (CORTEF) 10 MG tablet Take 10 mg by mouth 2 (two) times daily.    Yes [provider]  levothyroxine (SYNTHROID, LEVOTHROID) 100 MCG tablet Take 100 mcg by mouth daily before breakfast.    Yes [provider]  MAGNESIUM-OXIDE 400 (241.3 Mg) MG tablet Take 400 mg by mouth daily.   Yes [provider]  methenamine (HIPREX) 1 g tablet TAKE 1 TABLET BY MOUTH TWICE DAILY WITH MEALS 10/28/16  Yes Hollice Espy, MD  midodrine (PROAMATINE) 10 MG tablet Take 1 tablet (10 mg total) by mouth 3 (three) times daily with meals. Patient taking differently: Take 10 mg by mouth 2 (two) times daily with a meal.  08/30/16  Yes Sudini, Alveta Heimlich, MD  Multiple Vitamin (MULTIVITAMIN WITH MINERALS) TABS tablet Take 1 tablet by mouth daily.   Yes [provider]  ondansetron (ZOFRAN ODT) 4 MG disintegrating tablet Take 1 tablet (4 mg total) by mouth every 8 (eight) hours as needed for nausea or vomiting. 08/30/16  Yes Sudini, Alveta Heimlich, MD  pantoprazole (PROTONIX) 40 MG tablet Take 40 mg by mouth daily before breakfast.    Yes [provider]  potassium chloride (MICRO-K) 10 MEQ CR capsule Take 10 mEq by mouth daily as needed (only with lasix).    Yes [provider]   pyridostigmine (MESTINON) 60 MG tablet Take 60 mg by mouth 2 (two) times daily. 03/11/17  Yes [provider]  SAW PALMETTO-PUMPKIN SEED OIL PO Take 1 capsule by mouth daily.    Yes [provider]  simvastatin (ZOCOR) 20 MG tablet Take 20 mg by mouth daily at 6 PM.    Yes [provider]  vitamin B-12 (CYANOCOBALAMIN) 500 MCG tablet Take 500 mcg by mouth daily.    Yes [provider]  vitamin E 400 UNIT capsule Take 400 Units by mouth daily.    Yes [provider]  docusate sodium (COLACE) 100 MG capsule Take 1 capsule (100 mg total) by mouth 2 (two) times daily. Patient not taking: Reported on 11/17/2016 08/30/16 08/30/17  Hillary Bow, MD  doxycycline (VIBRA-TABS) 100 MG tablet Take 1 tablet (100 mg total) by mouth every 12 (twelve) hours. Patient not taking: Reported on 03/24/2017 11/20/16   Loletha Grayer, MD  ipratropium-albuterol (DUONEB) 0.5-2.5 (3) MG/3ML SOLN Take 3 mLs by nebulization every 6 (six) hours as needed. Patient not taking: Reported on 11/17/2016 06/12/16   Merton Border  B, MD    Allergies Penicillin g and Sulfa antibiotics  Family History  Problem Relation Age of Onset  . Hypertension Unknown   . Heart disease Unknown     Social History Social History   Tobacco Use  . Smoking status: Former Smoker    Types: Cigarettes    Last attempt to quit: 11/21/1981    Years since quitting: 35.3  . Smokeless tobacco: Never Used  Substance Use Topics  . Alcohol use: No    Alcohol/week: 0.0 oz  . Drug use: No    Review of Systems Constitutional: No fever/chills Eyes: No visual changes. ENT: No sore throat. No stiff neck no neck pain Cardiovascular: Denies chest pain. Respiratory: Denies shortness of breath. Gastrointestinal:   + vomiting.  No diarrhea.  No constipation. Genitourinary: Negative for dysuria. Musculoskeletal: Negative lower extremity swelling Skin: Negative for rash. Neurological: Negative for severe  headaches, focal weakness or numbness.   ____________________________________________   PHYSICAL EXAM:  VITAL SIGNS: ED Triage Vitals  Enc Vitals Group     BP 03/24/17 1531 110/63     Pulse Rate 03/24/17 1531 (!) 109     Resp 03/24/17 1531 (!) 32     Temp 03/24/17 1531 97.6 F (36.4 C)     Temp Source 03/24/17 1531 Oral     SpO2 03/24/17 1531 95 %     Weight 03/24/17 1532 170 lb (77.1 kg)     Height 03/24/17 1532 5\' 10"  (1.778 m)     Head Circumference --      Peak Flow --      Pain Score 03/24/17 1540 2     Pain Loc --      Pain Edu? --      Excl. in [place]? --     Constitutional: Alert and oriented. Well appearing and in no acute distress. Eyes: Conjunctivae are normal Head: Atraumatic HEENT: No congestion/rhinnorhea. Mucous membranes are moist.  Oropharynx non-erythematous Neck:   Nontender with no meningismus, no masses, no stridor Cardiovascular: Normal rate, regular rhythm. Grossly normal heart sounds.  Good peripheral circulation. Respiratory: Normal respiratory effort.  No retractions. Somewhat  Diminished breath sounds on the right.. Abdominal: Soft and minimally tender especially in the lower abdomen, voluntary guarding no rebound, no involuntary guarding, no surgical signs at this time.. No distention.  Back:  There is no focal tenderness or step off.  there is no midline tenderness there are no lesions noted. there is no CVA tenderness GU: Normal male external genitalia no tenderness masses or lesions noted Musculoskeletal: No lower extremity tenderness, no upper extremity tenderness. No joint effusions, no DVT signs strong distal pulses no edema Neurologic:  Normal speech and language. No gross focal neurologic deficits are appreciated.  Skin:  Skin is warm, dry and intact. No rash noted. Psychiatric: Mood and affect are normal. Speech and behavior are normal.  ____________________________________________   LABS (all labs ordered are listed, but only abnormal  results are displayed)  Labs Reviewed  COMPREHENSIVE METABOLIC PANEL - Abnormal; Notable for the following components:      Result Value   Chloride 100 (*)    Glucose, Bld 145 (*)    BUN 27 (*)    Total Protein 8.2 (*)    AST 47 (*)    All other components within normal limits  CBC - Abnormal; Notable for the following components:   RDW 16.0 (*)    All other components within normal limits  LIPASE, BLOOD  LACTIC ACID,  PLASMA  URINALYSIS, COMPLETE (UACMP) WITH MICROSCOPIC  LACTIC ACID, PLASMA  DIGOXIN LEVEL    Pertinent labs  results that were available during my care of the patient were reviewed by me and considered in my medical decision making (see chart for details). ____________________________________________  EKG  I personally interpreted any EKGs ordered by me or triage  ____________________________________________  RADIOLOGY  Pertinent labs & imaging results that were available during my care of the patient were reviewed by me and considered in my medical decision making (see chart for details). If possible, patient and/or family made aware of any abnormal findings.  No results found. ____________________________________________    PROCEDURES  Procedure(s) performed: None  Procedures  Critical Care performed: None  ____________________________________________   INITIAL IMPRESSION / ASSESSMENT AND PLAN / ED COURSE  Pertinent labs & imaging results that were available during my care of the patient were reviewed by me and considered in my medical decision making (see chart for details).  Patient here with abdominal pain for 2 days, history of SBO multiple different medical problems, we will give him a very small bolus of IV fluid given his known history of CHF, has he has not had much to eat or drink in the last 2 days.  We will obtain CT scan to rule out obstruction, we will check a digitalis level, blood work is reassuring thus far, patient is not  discomfort, we will give him pain medication we will continue to observe.   ----------------------------------------- 6:40 PM on 03/24/2017 ----------------------------------------- And in no acute distress serial abdominal exams are completely benign, I do not think he has "pain out of proportion to exam".  He does have a tendency to over diuresis make himself feel bad, and his BUN/creatinine ratio suggest that may have happened today.  I did give him 1/2 L of fluid he feels much better lungs are changed.  He does have a chronic right-sided empyema/effusion, he has no pulmonary complaints and he has no change on imaging in that regard.  He does not appear to be septic.  He does not appear to have any other significant pathology that I can identify with CT scan chest x-ray blood work etc. aside from constipation.  I have offered him disimpaction here which she declines I have offered a rectal exam here which she declines, I have offered enema here which she declines, 8 the family and he would rather go home and take over-the-counter laxatives which is certainly not unreasonable there is no evidence of impaction that I see clearly on CT nor is there any evidence of obstruction.  Family and patient are very aware that there is a limited view that we get in the emergency department should he have any new or worrisome symptoms including increased pain fever vomiting etc., he is to return to the emergency department.  They are very comfortable with this plan.  They will follow closely up with Dr. Doy Hutching tomorrow, and they are eager to go home.  Considering the patient's symptoms, medical history, and physical examination today, I have low suspicion for cholecystitis or biliary pathology, pancreatitis, perforation or bowel obstruction, hernia, intra-abdominal abscess, AAA or dissection, volvulus or intussusception, mesenteric ischemia, ischemic gut, pyelonephritis or appendicitis.        ____________________________________________   FINAL CLINICAL IMPRESSION(S) / ED DIAGNOSES  Final diagnoses:  Cough      This chart was dictated using voice recognition software.  Despite best efforts to proofread,  errors can occur which can change  meaning.      Schuyler Amor, MD 03/24/17 1717    Schuyler Amor, MD 03/24/17 1842    Schuyler Amor, MD 03/24/17 1843

## 2017-03-27 ENCOUNTER — Emergency Department
Admission: EM | Admit: 2017-03-27 | Discharge: 2017-03-27 | Disposition: A | Discharge status: Home / Self Care | Payer: Medicare Other | Attending: Emergency Medicine | Admitting: Emergency Medicine

## 2017-03-27 ENCOUNTER — Emergency Department: Payer: Medicare Other

## 2017-03-27 ENCOUNTER — Other Ambulatory Visit: Payer: Self-pay

## 2017-03-27 DIAGNOSIS — R0602 Shortness of breath: Secondary | ICD-10-CM | POA: Insufficient documentation

## 2017-03-27 DIAGNOSIS — Z7982 Long term (current) use of aspirin: Secondary | ICD-10-CM | POA: Diagnosis not present

## 2017-03-27 DIAGNOSIS — Z85118 Personal history of other malignant neoplasm of bronchus and lung: Secondary | ICD-10-CM | POA: Diagnosis not present

## 2017-03-27 DIAGNOSIS — R112 Nausea with vomiting, unspecified: Secondary | ICD-10-CM | POA: Insufficient documentation

## 2017-03-27 DIAGNOSIS — I5032 Chronic diastolic (congestive) heart failure: Secondary | ICD-10-CM | POA: Insufficient documentation

## 2017-03-27 DIAGNOSIS — E1140 Type 2 diabetes mellitus with diabetic neuropathy, unspecified: Secondary | ICD-10-CM | POA: Diagnosis not present

## 2017-03-27 DIAGNOSIS — E860 Dehydration: Secondary | ICD-10-CM | POA: Diagnosis not present

## 2017-03-27 DIAGNOSIS — Z79899 Other long term (current) drug therapy: Secondary | ICD-10-CM | POA: Diagnosis not present

## 2017-03-27 DIAGNOSIS — E039 Hypothyroidism, unspecified: Secondary | ICD-10-CM | POA: Insufficient documentation

## 2017-03-27 DIAGNOSIS — R109 Unspecified abdominal pain: Secondary | ICD-10-CM | POA: Diagnosis not present

## 2017-03-27 DIAGNOSIS — Z87891 Personal history of nicotine dependence: Secondary | ICD-10-CM | POA: Diagnosis not present

## 2017-03-27 DIAGNOSIS — R197 Diarrhea, unspecified: Secondary | ICD-10-CM | POA: Insufficient documentation

## 2017-03-27 LAB — COMPREHENSIVE METABOLIC PANEL
ALT: 26 U/L (ref 17–63)
ANION GAP: 11 (ref 5–15)
AST: 47 U/L — AB (ref 15–41)
Albumin: 3.9 g/dL (ref 3.5–5.0)
Alkaline Phosphatase: 126 U/L (ref 38–126)
BILIRUBIN TOTAL: 1.1 mg/dL (ref 0.3–1.2)
BUN: 14 mg/dL (ref 6–20)
CHLORIDE: 100 mmol/L — AB (ref 101–111)
CO2: 26 mmol/L (ref 22–32)
Calcium: 8.8 mg/dL — ABNORMAL LOW (ref 8.9–10.3)
Creatinine, Ser: 1.17 mg/dL (ref 0.61–1.24)
GFR calc Af Amer: >60 mL/min (ref >60)
GFR, EST NON AFRICAN AMERICAN: 57 mL/min — AB (ref >60)
Glucose, Bld: 107 mg/dL — ABNORMAL HIGH (ref 65–99)
POTASSIUM: 4.4 mmol/L (ref 3.5–5.1)
Sodium: 137 mmol/L (ref 135–145)
TOTAL PROTEIN: 8.1 g/dL (ref 6.5–8.1)

## 2017-03-27 LAB — CBC
HCT: 43.2 % (ref 40.0–52.0)
Hemoglobin: 14.1 g/dL (ref 13.0–18.0)
MCH: 30 pg (ref 26.0–34.0)
MCHC: 32.6 g/dL (ref 32.0–36.0)
MCV: 91.9 fL (ref 80.0–100.0)
PLATELETS: 211 10*3/uL (ref 150–440)
RBC: 4.7 MIL/uL (ref 4.40–5.90)
RDW: 15.3 % — ABNORMAL HIGH (ref 11.5–14.5)
WBC: 7.1 10*3/uL (ref 3.8–10.6)

## 2017-03-27 LAB — LIPASE, BLOOD: LIPASE: 24 U/L (ref 11–51)

## 2017-03-27 LAB — URINALYSIS, COMPLETE (UACMP) WITH MICROSCOPIC
BILIRUBIN URINE: NEGATIVE
Glucose, UA: NEGATIVE mg/dL
Hgb urine dipstick: NEGATIVE
KETONES UR: NEGATIVE mg/dL
Nitrite: NEGATIVE
PH: 6 (ref 5.0–8.0)
Protein, ur: NEGATIVE mg/dL
SPECIFIC GRAVITY, URINE: 1.01 (ref 1.005–1.030)
Squamous Epithelial / LPF: NONE SEEN

## 2017-03-27 LAB — INFLUENZA PANEL BY PCR (TYPE A & B)
Influenza A By PCR: NEGATIVE
Influenza B By PCR: NEGATIVE

## 2017-03-27 MED ORDER — SODIUM CHLORIDE 0.9 % IV BOLUS (SEPSIS)
500.0000 mL | Freq: Once | INTRAVENOUS | Status: AC
  Administered 2017-03-27: 500 mL via INTRAVENOUS

## 2017-03-27 MED ORDER — ONDANSETRON HCL 4 MG/2ML IJ SOLN
4.0000 mg | Freq: Once | INTRAMUSCULAR | Status: AC
  Administered 2017-03-27: 4 mg via INTRAVENOUS
  Filled 2017-03-27: qty 2

## 2017-03-27 NOTE — Discharge Instructions (Signed)
Try to stay well-hydrated with small amounts of fluid at a time, and gradually advance your diet to start eating normal foods.  Return to the emergency department for new, worsening, or persistent severe diarrhea, blood in the stool, persistent vomiting, abdominal pain, fevers, or any other new or worsening symptoms that concern you.  Follow-up with Dr. Doy Hutching.

## 2017-03-27 NOTE — ED Notes (Signed)
Patient to attempt PO challenge.  Family went to get soup from cafeteria for patient.

## 2017-03-27 NOTE — ED Provider Notes (Signed)
[facility] Emergency Department Provider Note ____________________________________________   First MD Initiated Contact with Patient 03/27/17 1103     (approximate)  I have reviewed the triage vital signs and the nursing notes.   HISTORY  Chief Complaint Nausea; Emesis; and Diarrhea    HPI Russell Howard is a 83 y.o. male with past medical history as noted below who presents with nausea, vomiting, diarrhea, persistent over the last 5 days, persistent course, associated with some intermittent crampy abdominal pain which is not present currently.  He denies associated fever, chest pain, or urinary symptoms.  He reports chronic shortness of breath which is unchanged recently.  The patient was seen in the ED 3 days ago for the same, and had CT which was unrevealing.   Past Medical History:  Diagnosis Date  . Acquired hypothyroidism 11/02/2014  . BPH (benign prostatic hyperplasia)   . Cardiac defibrillator in place 11/02/2014  . Cardiomyopathy ([place])   . CHF (congestive heart failure) ([place])   . Chronic diastolic heart failure ([place]) 11/02/2014  . COPD (chronic obstructive pulmonary disease) ([place])   . Gastro-esophageal reflux disease without esophagitis 11/02/2014  . H/O malignant neoplasm 11/09/2014  . Heart valve disease 11/02/2014  . History of atrial fibrillation 11/02/2014  . Lung cancer ([place])   . Neuropathy 11/02/2014  . Orthostasis 11/02/2014  . Pure hypercholesterolemia 11/02/2014  . Valvular heart disease     Patient Active Problem List   Diagnosis Date Noted  . UTI (urinary tract infection) 11/17/2016  . Empyema ([place])   . Weakness   . Community acquired pneumonia   . Pleural effusion   . Palliative care by specialist   . Acute on chronic respiratory failure with hypoxia ([place]) 08/19/2016  . Acute respiratory failure with hypoxia ([place]) 08/19/2016  . Septic shock ([place]) 03/02/2016  . Hydronephrosis, right 01/14/2016  . Constipation 01/13/2016  .  Urinary retention 01/13/2016  . Lower abdominal pain 01/13/2016  . Bacteremia 01/13/2016  . Hypotension 01/13/2016  . Palliative care encounter   . Goals of care, counseling/discussion   . Cough   . Encounter for hospice care discussion   . Hypoxia 01/09/2016  . Primary cancer of bronchus of right lower lobe ([place]) 11/29/2015  . Sepsis ([place]) 06/14/2015  . Acute lower UTI 06/14/2015    Class: Acute  . H/O malignant neoplasm 11/09/2014  . Acquired hypothyroidism 11/02/2014  . Cardiomyopathy ([place]) 11/02/2014  . Acute on chronic diastolic CHF (congestive heart failure) ([place]) 11/02/2014  . Diabetes mellitus without complication ([place]) 51/70/0174  . Cardiac defibrillator in place 11/02/2014  . Gastro-esophageal reflux disease without esophagitis 11/02/2014  . History of atrial fibrillation 11/02/2014  . BP (high blood pressure) 11/02/2014  . Neuropathy 11/02/2014  . Orthostasis 11/02/2014  . Pure hypercholesterolemia 11/02/2014  . Heart valve disease 11/02/2014    Past Surgical History:  Procedure Laterality Date  . APPENDECTOMY    . DUAL ICD IMPLANT  2007/2009   implantable cardiac defibrillator  . PORT-A-CATH REMOVAL      Prior to Admission medications   Medication Sig Start Date End Date Taking? Authorizing Provider  acidophilus (RISAQUAD) CAPS capsule Take 1 capsule by mouth daily.   Yes [provider]  Ascorbic Acid (VITAMIN C) 1000 MG tablet Take 500 mg by mouth 3 (three) times daily. Reported on 06/14/2015   Yes [provider]  aspirin EC 81 MG tablet Take 81 mg by mouth daily.    Yes [provider]  Cholecalciferol 5000 units  TABS Take 5,000 Units by mouth every morning.   Yes [provider]  Coenzyme Q10 (TH CO Q-10) 100 MG capsule Take 100 mg by mouth daily.    Yes [provider]  [place] 250 MCG tablet Take 1 tablet (250 mcg total) by mouth daily. Patient taking differently: Take 125 mcg by mouth daily.  08/30/16  Yes  Sudini, Alveta Heimlich, MD  gabapentin (NEURONTIN) 300 MG capsule Take by mouth 2 (two) times daily.    Yes [provider]  guaiFENesin-codeine (ROBITUSSIN AC) 100-10 MG/5ML syrup Take 5 mLs by mouth 3 (three) times daily as needed for cough.   Yes [provider]  hydrocortisone (CORTEF) 10 MG tablet Take 10 mg by mouth 2 (two) times daily.    Yes [provider]  Levothyroxine Sodium 25 MCG CAPS Take by mouth daily before breakfast.    Yes [provider]  MAGNESIUM-OXIDE 400 (241.3 Mg) MG tablet Take 400 mg by mouth daily.   Yes [provider]  methenamine (HIPREX) 1 g tablet TAKE 1 TABLET BY MOUTH TWICE DAILY WITH MEALS 10/28/16  Yes Hollice Espy, MD  midodrine (PROAMATINE) 10 MG tablet Take 1 tablet (10 mg total) by mouth 3 (three) times daily with meals. Patient taking differently: Take 10 mg by mouth 2 (two) times daily with a meal.  08/30/16  Yes Sudini, Alveta Heimlich, MD  Multiple Vitamin (MULTIVITAMIN WITH MINERALS) TABS tablet Take 1 tablet by mouth daily.   Yes [provider]  pantoprazole (PROTONIX) 40 MG tablet Take 40 mg by mouth daily before breakfast.    Yes [provider]  pyridostigmine (MESTINON) 60 MG tablet Take 60 mg by mouth 2 (two) times daily. 03/11/17  Yes [provider]  ranitidine (ZANTAC) 300 MG capsule Take 300 mg by mouth every evening.   Yes [provider]  SAW PALMETTO-PUMPKIN SEED OIL PO Take 1,000 mg by mouth daily.    Yes [provider]  simvastatin (ZOCOR) 20 MG tablet Take 20 mg by mouth daily at 6 PM.    Yes [provider]  vitamin B-12 (CYANOCOBALAMIN) 500 MCG tablet Take 500 mcg by mouth daily.    Yes [provider]  vitamin E 400 UNIT capsule Take 400 Units by mouth daily.    Yes [provider]  docusate sodium (COLACE) 100 MG capsule Take 1 capsule (100 mg total) by mouth 2 (two) times daily. Patient not taking: Reported on 11/17/2016 08/30/16  08/30/17  Hillary Bow, MD  doxycycline (VIBRA-TABS) 100 MG tablet Take 1 tablet (100 mg total) by mouth every 12 (twelve) hours. Patient not taking: Reported on 03/24/2017 11/20/16   Loletha Grayer, MD  furosemide (LASIX) 20 MG tablet Take 20 mg by mouth every other day as needed (as needed for water retention).  05/10/15   [provider]  guaiFENesin-dextromethorphan (ROBITUSSIN DM) 100-10 MG/5ML syrup Take 5 mLs by mouth every 4 (four) hours as needed for cough. Patient not taking: Reported on 03/27/2017 08/27/16   Hillary Bow, MD  ipratropium-albuterol (DUONEB) 0.5-2.5 (3) MG/3ML SOLN Take 3 mLs by nebulization every 6 (six) hours as needed. Patient not taking: Reported on 11/17/2016 06/12/16   Wilhelmina Mcardle, MD  ondansetron (ZOFRAN ODT) 4 MG disintegrating tablet Take 1 tablet (4 mg total) by mouth every 8 (eight) hours as needed for nausea or vomiting. 08/30/16   Hillary Bow, MD  ondansetron (ZOFRAN) 4 MG tablet Take 1 tablet by mouth 3 (three) times daily. 03/23/17  [provider]  potassium chloride (MICRO-K) 10 MEQ CR capsule Take 10 mEq by mouth daily as needed (only with lasix).     [provider]    Allergies Penicillin g and Sulfa antibiotics  Family History  Problem Relation Age of Onset  . Hypertension Unknown   . Heart disease Unknown     Social History Social History   Tobacco Use  . Smoking status: Former Smoker    Types: Cigarettes    Last attempt to quit: 11/21/1981    Years since quitting: 35.3  . Smokeless tobacco: Never Used  Substance Use Topics  . Alcohol use: No    Alcohol/week: 0.0 oz  . Drug use: No    Review of Systems  Constitutional: No fever. Eyes: No redness. ENT: No sore throat. Cardiovascular: Denies chest pain. Respiratory: Positive for chronic shortness of breath. Gastrointestinal: Positive for nausea, vomiting, diarrhea.  Genitourinary: Negative for dysuria.  Musculoskeletal: Negative for back  pain. Skin: Negative for rash. Neurological: Negative for headache.   ____________________________________________   PHYSICAL EXAM:  VITAL SIGNS: ED Triage Vitals  Enc Vitals Group     BP 03/27/17 1023 122/75     Pulse Rate 03/27/17 1023 90     Resp 03/27/17 1023 (!) 24     Temp 03/27/17 1023 97.9 F (36.6 C)     Temp Source 03/27/17 1023 Oral     SpO2 03/27/17 1023 100 %     Weight 03/27/17 1021 170 lb (77.1 kg)     Height 03/27/17 1021 5\' 10"  (1.778 m)     Head Circumference --      Peak Flow --      Pain Score 03/27/17 1021 4     Pain Loc --      Pain Edu? --      Excl. in [place]? --     Constitutional: Alert and oriented.  Somewhat weak appearing. Eyes: Conjunctivae are normal.  No scleral icterus. Head: Atraumatic. Nose: No congestion/rhinnorhea. Mouth/Throat: Mucous membranes are dry.   Neck: Normal range of motion.  Cardiovascular: Normal rate, regular rhythm. Grossly normal heart sounds.  Good peripheral circulation. Respiratory: Normal respiratory effort.  No retractions. Lungs CTAB. Gastrointestinal: Soft and nontender. No distention.  Genitourinary: No CVA tenderness. Musculoskeletal:  Extremities warm and well perfused.  Neurologic:  Normal speech and language. No gross focal neurologic deficits are appreciated.  Skin:  Skin is warm and dry. No rash noted. Psychiatric: Mood and affect are normal. Speech and behavior are normal.  ____________________________________________   LABS (all labs ordered are listed, but only abnormal results are displayed)  Labs Reviewed  COMPREHENSIVE METABOLIC PANEL - Abnormal; Notable for the following components:      Result Value   Chloride 100 (*)    Glucose, Bld 107 (*)    Calcium 8.8 (*)    AST 47 (*)    GFR calc non Af Amer 57 (*)    All other components within normal limits  CBC - Abnormal; Notable for the following components:   RDW 15.3 (*)    All other components within normal limits  URINALYSIS, COMPLETE  (UACMP) WITH MICROSCOPIC - Abnormal; Notable for the following components:   Color, Urine YELLOW (*)    APPearance CLOUDY (*)    Leukocytes, UA LARGE (*)    Bacteria, UA RARE (*)    All other components within normal limits  LIPASE, BLOOD  INFLUENZA PANEL BY PCR (TYPE A & B)   ____________________________________________  EKG  ED ECG REPORT I, Arta Silence, the attending physician, personally viewed and interpreted this ECG.  Date: 03/27/2017 EKG Time: 1024 Rate: 89 Rhythm: AV dual paced rhythm Narrative Interpretation: no evidence of acute ischemia  ____________________________________________  RADIOLOGY    ____________________________________________   PROCEDURES  Procedure(s) performed: No  Procedures  Critical Care performed: No ____________________________________________   INITIAL IMPRESSION / ASSESSMENT AND PLAN / ED COURSE  Pertinent labs & imaging results that were available during my care of the patient were reviewed by me and considered in my medical decision making (see chart for details).  83 year old male with past medical history as noted above presents with nausea, vomiting, diarrhea over approximately last 5 days.  The patient was seen in the ED 3 days ago for the same, and had a CT which showed some increased stool burden consistent with constipation.  Patient had some intermittent constipation at that time, but has had only diarrhea since that ED visit.  On exam here, vital signs are normal, the patient is somewhat weak but not acutely ill-appearing, and the abdomen is soft and nontender.  I reviewed the past medical records in Epic and confirmed that the patient was seen in the ED 3 days ago and had CT which showed no significant acute findings.  Presentation is most consistent with viral gastroenteritis or other benign cause, versus medication side effects.  Given the recent CT, with no significant change in the symptoms since that time,  no tenderness currently and no fever or other findings concerning for infection, there is no clinical evidence for colitis, diverticulitis, or surgical etiology.  Plan: Lab workup, symptomatic treatment, gentle IV hydration (given patient's history of CHF will start with 500 cc of NS) and reassess.    ----------------------------------------- 4:05 PM on 03/27/2017 -----------------------------------------  On reassessment after fluids, the patient appears much more comfortable and energetic.  He continues to have no abdominal pain.  Vital signs are stable (patient had borderline low blood pressures at times during his stay, but I believe that this was more due to changes in position while the blood pressure cuff was running).  I discussed the case with patient's primary care doctor, Dr. Doy Hutching, who is aware of his symptoms, and has been in communication with the patient's daughter.  He agrees with me that based on the ED workup, the patient was safe for discharge home with follow-up.  I counseled the patient and daughter extensively on the results of the workup, return precautions, and follow-up plan, and they expressed understanding.  The patient was able to eat, and is tolerating p.o.  Patient states he also passed gas several times without any recurrent diarrhea or passage of stool, so he states that he thinks the symptoms may be in fact resolving.  He feels comfortable to go home.  ____________________________________________   FINAL CLINICAL IMPRESSION(S) / ED DIAGNOSES  Final diagnoses:  Diarrhea, unspecified type  Dehydration      NEW MEDICATIONS STARTED DURING THIS VISIT:  Discharge Medication List as of 03/27/2017  3:37 PM       Note:  This document was prepared using Dragon voice recognition software and may include unintentional dictation errors.    Arta Silence, MD 03/27/17 [phone]

## 2017-03-27 NOTE — ED Triage Notes (Signed)
N/V/D onset Monday.  Seen here Tuesday. No relief.  States he cant keep anything down. No fever. vss per ems

## 2017-03-27 NOTE — ED Triage Notes (Signed)
Pt states he has only thrown up a few times and had 5 episodes of diarrhea.

## 2017-04-01 ENCOUNTER — Ambulatory Visit: Payer: Medicare Other | Admitting: Pulmonary Disease

## 2017-04-07 ENCOUNTER — Encounter: Payer: Self-pay | Admitting: Pulmonary Disease

## 2017-04-07 ENCOUNTER — Ambulatory Visit (INDEPENDENT_AMBULATORY_CARE_PROVIDER_SITE_OTHER): Payer: Medicare Other | Admitting: Pulmonary Disease

## 2017-04-07 VITALS — BP 112/60 | HR 117 | Ht 70.0 in | Wt 170.0 lb

## 2017-04-07 DIAGNOSIS — J9611 Chronic respiratory failure with hypoxia: Secondary | ICD-10-CM

## 2017-04-07 DIAGNOSIS — J701 Chronic and other pulmonary manifestations due to radiation: Secondary | ICD-10-CM | POA: Diagnosis not present

## 2017-04-07 DIAGNOSIS — IMO0002 Reserved for concepts with insufficient information to code with codable children: Secondary | ICD-10-CM

## 2017-04-07 DIAGNOSIS — Z85118 Personal history of other malignant neoplasm of bronchus and lung: Secondary | ICD-10-CM

## 2017-04-07 DIAGNOSIS — R042 Hemoptysis: Secondary | ICD-10-CM

## 2017-04-07 DIAGNOSIS — J209 Acute bronchitis, unspecified: Secondary | ICD-10-CM

## 2017-04-07 DIAGNOSIS — J208 Acute bronchitis due to other specified organisms: Secondary | ICD-10-CM

## 2017-04-07 MED ORDER — DOXYCYCLINE HYCLATE 100 MG PO CAPS: 100.0000 mg | ORAL_CAPSULE | Freq: Two times a day (BID) | ORAL | 0 refills | Status: AC

## 2017-04-07 NOTE — Patient Instructions (Addendum)
Continue your current breathing medications and oxygen therapy as previously prescribed Doxycycline 100 mg twice a day for 5 days Follow-up in 3-4 months or sooner as needed for any breathing or pulmonary problems

## 2017-04-09 NOTE — Progress Notes (Signed)
PULMONARY OFFICE FOLLOW NOTE   PROBLEMS:  History of lung cancer, S/P XRT to R lung 2015 Extensive R lung radiation fibrosis Chronic hypoxemic respiratory failure Hospitalized 01/28-01/31/18 for severe sepsis and presumed PNA  DATA/EVENTS: PET 03/02/15: No findings suspicious for local recurrence or definite metastatic disease CT chest 11/10/15: Persistent volume loss, prominent fibrosis, bronchiectasis and interstitial infiltrates in the right upper lobe, with similar but less severe changes in the remaining of the right lung parenchyma. Increasing perifissural and peripleural nodular and linear interstitial thickening in the left lung. Hospitalized 07/17-07/28/18 with acute on chronic respiratory failure due to CAP, NOS.   INTERVAL HISTORY: No major respiratory or pulmonary events since last office visit 11/14/16  SUBJ: This is a routine reevaluation.  Since last visit, he has been in his usual state of health with no significant change or worsening in his respiratory status.  He does note that he had scant hemoptysis last week and now has mildly discolored mucus and chest congestion.  He has also had a recent sore throat.  He denies fever.  He denies significant change in exertional dyspnea..  OBJ: Vitals:   04/07/17 1125 04/07/17 1131  BP:  112/60  Pulse:  (!) 117  SpO2:  94%  Weight: 77.1 kg (170 lb)   Height: 5\' 10"  (1.778 m)   2 LPM [place] pulse   Gen: No distress while at rest.  Patient is in wheelchair HEENT: NCAT, sclerae white Neck: No LAN, no JVD noted Lungs: Breath sounds are markedly diminished on the right.  There are no wheezes or other adventitious sounds Cardiovascular: Regular, mildly tachycardic, no murmurs noted Abdomen: Soft, NT, +BS Ext: no C/C/E  Neuro: No focal deficits noted on limited exam    DATA: BMP Latest Ref Rng & Units 03/27/2017 03/24/2017 11/19/2016  Glucose 65 - 99 mg/dL 107(H) 145(H) 121(H)  BUN 6 - 20 mg/dL 14 27(H) 22(H)  Creatinine 0.61 -  1.24 mg/dL 1.17 0.88 1.16  Sodium 135 - 145 mmol/L 137 136 137  Potassium 3.5 - 5.1 mmol/L 4.4 5.1 4.2  Chloride 101 - 111 mmol/L 100(L) 100(L) 105  CO2 22 - 32 mmol/L 26 23 25   Calcium 8.9 - 10.3 mg/dL 8.8(L) 9.2 9.5    CBC Latest Ref Rng & Units 03/27/2017 03/24/2017 11/18/2016  WBC 3.8 - 10.6 K/uL 7.1 7.9 7.5  Hemoglobin 13.0 - 18.0 g/dL 14.1 14.2 13.2  Hematocrit 40.0 - 52.0 % 43.2 42.4 39.2(L)  Platelets 150 - 440 K/uL 211 216 171    CXR: 03/27/17 -extensive radiation fibrotic changes in the right lung, not significant changed.  AICD present.  Left lung with mild interstitial prominence and no acute findings  IMPRESSION: Hypoxemic respiratory failure, chronic (HCC)  History of lung cancer  Radiation fibrosis (HCC)  Acute purulent bronchitis  Scant, transient hemoptysis last week  DISCUSSION: Recent hemoptysis and now with increased purulent mucus.  Will treat as acute bronchitis  PLAN: Continue DuoNeb every 6 hours as needed Continue oxygen therapy as close to 24 hours per day as possible Doxycycline 100 mg twice a day for 5 days Follow-up in 3-4 months or sooner as needed  Merton Border, MD PCCM service Mobile [phone] Pager [phone] 04/09/2017 10:59 AM

## 2017-04-29 ENCOUNTER — Encounter: Payer: Self-pay | Admitting: Emergency Medicine

## 2017-04-29 ENCOUNTER — Other Ambulatory Visit: Payer: Self-pay

## 2017-04-29 ENCOUNTER — Emergency Department
Admission: EM | Admit: 2017-04-29 | Discharge: 2017-04-29 | Disposition: A | Discharge status: Home / Self Care | Payer: Medicare Other | Attending: Emergency Medicine | Admitting: Emergency Medicine

## 2017-04-29 ENCOUNTER — Emergency Department: Payer: Medicare Other

## 2017-04-29 DIAGNOSIS — Z87891 Personal history of nicotine dependence: Secondary | ICD-10-CM | POA: Insufficient documentation

## 2017-04-29 DIAGNOSIS — J449 Chronic obstructive pulmonary disease, unspecified: Secondary | ICD-10-CM | POA: Diagnosis not present

## 2017-04-29 DIAGNOSIS — E119 Type 2 diabetes mellitus without complications: Secondary | ICD-10-CM | POA: Diagnosis not present

## 2017-04-29 DIAGNOSIS — E039 Hypothyroidism, unspecified: Secondary | ICD-10-CM | POA: Diagnosis not present

## 2017-04-29 DIAGNOSIS — Z7982 Long term (current) use of aspirin: Secondary | ICD-10-CM | POA: Diagnosis not present

## 2017-04-29 DIAGNOSIS — I5032 Chronic diastolic (congestive) heart failure: Secondary | ICD-10-CM | POA: Insufficient documentation

## 2017-04-29 DIAGNOSIS — Z79899 Other long term (current) drug therapy: Secondary | ICD-10-CM | POA: Insufficient documentation

## 2017-04-29 DIAGNOSIS — J101 Influenza due to other identified influenza virus with other respiratory manifestations: Secondary | ICD-10-CM | POA: Diagnosis not present

## 2017-04-29 DIAGNOSIS — R531 Weakness: Secondary | ICD-10-CM | POA: Diagnosis present

## 2017-04-29 DIAGNOSIS — Z95810 Presence of automatic (implantable) cardiac defibrillator: Secondary | ICD-10-CM | POA: Diagnosis not present

## 2017-04-29 LAB — URINALYSIS, COMPLETE (UACMP) WITH MICROSCOPIC
BACTERIA UA: NONE SEEN
Bilirubin Urine: NEGATIVE
Glucose, UA: NEGATIVE mg/dL
Hgb urine dipstick: NEGATIVE
KETONES UR: NEGATIVE mg/dL
Nitrite: NEGATIVE
PH: 6 (ref 5.0–8.0)
PROTEIN: NEGATIVE mg/dL
Specific Gravity, Urine: 1.018 (ref 1.005–1.030)

## 2017-04-29 LAB — LACTIC ACID, PLASMA: LACTIC ACID, VENOUS: 1.8 mmol/L (ref 0.5–1.9)

## 2017-04-29 LAB — COMPREHENSIVE METABOLIC PANEL
ALBUMIN: 3.7 g/dL (ref 3.5–5.0)
ALT: 14 U/L — ABNORMAL LOW (ref 17–63)
ANION GAP: 9 (ref 5–15)
AST: 26 U/L (ref 15–41)
Alkaline Phosphatase: 73 U/L (ref 38–126)
BILIRUBIN TOTAL: 0.6 mg/dL (ref 0.3–1.2)
BUN: 23 mg/dL — ABNORMAL HIGH (ref 6–20)
CHLORIDE: 101 mmol/L (ref 101–111)
CO2: 28 mmol/L (ref 22–32)
Calcium: 9.2 mg/dL (ref 8.9–10.3)
Creatinine, Ser: 0.98 mg/dL (ref 0.61–1.24)
GFR calc Af Amer: >60 mL/min (ref >60)
GFR calc non Af Amer: >60 mL/min (ref >60)
GLUCOSE: 137 mg/dL — AB (ref 65–99)
POTASSIUM: 4.4 mmol/L (ref 3.5–5.1)
SODIUM: 138 mmol/L (ref 135–145)
Total Protein: 8 g/dL (ref 6.5–8.1)

## 2017-04-29 LAB — CBC WITH DIFFERENTIAL/PLATELET
BASOS PCT: 0 %
Basophils Absolute: 0 10*3/uL (ref 0–0.1)
EOS ABS: 0.2 10*3/uL (ref 0–0.7)
Eosinophils Relative: 2 %
HEMATOCRIT: 41.5 % (ref 40.0–52.0)
Hemoglobin: 13.9 g/dL (ref 13.0–18.0)
Lymphocytes Relative: 7 %
Lymphs Abs: 0.6 10*3/uL — ABNORMAL LOW (ref 1.0–3.6)
MCH: 30.7 pg (ref 26.0–34.0)
MCHC: 33.5 g/dL (ref 32.0–36.0)
MCV: 91.7 fL (ref 80.0–100.0)
MONO ABS: 1.3 10*3/uL — AB (ref 0.2–1.0)
Monocytes Relative: 14 %
Neutro Abs: 6.9 10*3/uL — ABNORMAL HIGH (ref 1.4–6.5)
Neutrophils Relative %: 77 %
Platelets: 180 10*3/uL (ref 150–440)
RBC: 4.53 MIL/uL (ref 4.40–5.90)
RDW: 15.2 % — AB (ref 11.5–14.5)
WBC: 9.1 10*3/uL (ref 3.8–10.6)

## 2017-04-29 LAB — INFLUENZA PANEL BY PCR (TYPE A & B)
Influenza A By PCR: POSITIVE — AB
Influenza B By PCR: NEGATIVE

## 2017-04-29 LAB — TROPONIN I

## 2017-04-29 MED ORDER — OSELTAMIVIR PHOSPHATE 75 MG PO CAPS
75.0000 mg | ORAL_CAPSULE | Freq: Once | ORAL | Status: AC
  Administered 2017-04-29: 75 mg via ORAL
  Filled 2017-04-29: qty 1

## 2017-04-29 MED ORDER — OSELTAMIVIR PHOSPHATE 75 MG PO CAPS: 75.0000 mg | ORAL_CAPSULE | Freq: Two times a day (BID) | ORAL | 0 refills | Status: AC

## 2017-04-29 MED ORDER — ACETAMINOPHEN 500 MG PO TABS
1000.0000 mg | ORAL_TABLET | Freq: Once | ORAL | Status: AC
  Administered 2017-04-29: 1000 mg via ORAL
  Filled 2017-04-29: qty 2

## 2017-04-29 MED ORDER — ONDANSETRON 4 MG PO TBDP: 4.0000 mg | ORAL_TABLET | Freq: Three times a day (TID) | ORAL | 0 refills | Status: DC | PRN

## 2017-04-29 MED ORDER — SODIUM CHLORIDE 0.9 % IV BOLUS
500.0000 mL | Freq: Once | INTRAVENOUS | Status: AC
  Administered 2017-04-29: 500 mL via INTRAVENOUS

## 2017-04-29 NOTE — ED Provider Notes (Signed)
[facility] Emergency Department Provider Note  ____________________________________________  Time seen: Approximately 3:29 PM  I have reviewed the triage vital signs and the nursing notes.   HISTORY  Chief Complaint Weakness  Level 5 caveat:  Portions of the history and physical were unable to be obtained due to poor historia   HPI Russell Swaim is a 83 y.o. male with a history of nonischemic cardiomyopathy EF 45%, AICD, afib, COPD on 2L [place], lung cancer on remission resents for evaluation of generalized weakness. patient is a very poor historian.  Patient reports that he has been feeling very weak but is unable to tell me for how many days.  He reports that he feels a little bit more short of breath than his baseline.  The shortness of breath is worse with sitting up and walking and better with laying flat.  He is coughing.  He does not know if he has had fever.  He denies nausea, vomiting, diarrhea, chest pain, abdominal pain, dysuria.  Past Medical History:  Diagnosis Date  . Acquired hypothyroidism 11/02/2014  . BPH (benign prostatic hyperplasia)   . Cardiac defibrillator in place 11/02/2014  . Cardiomyopathy ([place])   . CHF (congestive heart failure) ([place])   . Chronic diastolic heart failure ([place]) 11/02/2014  . COPD (chronic obstructive pulmonary disease) ([place])   . Gastro-esophageal reflux disease without esophagitis 11/02/2014  . H/O malignant neoplasm 11/09/2014  . Heart valve disease 11/02/2014  . History of atrial fibrillation 11/02/2014  . Lung cancer ([place])   . Neuropathy 11/02/2014  . Orthostasis 11/02/2014  . Pure hypercholesterolemia 11/02/2014  . Valvular heart disease     Patient Active Problem List   Diagnosis Date Noted  . UTI (urinary tract infection) 11/17/2016  . Empyema ([place])   . Weakness   . Community acquired pneumonia   . Pleural effusion   . Palliative care by specialist   . Acute on chronic respiratory failure with hypoxia ([place])  08/19/2016  . Acute respiratory failure with hypoxia ([place]) 08/19/2016  . Septic shock ([place]) 03/02/2016  . Hydronephrosis, right 01/14/2016  . Constipation 01/13/2016  . Urinary retention 01/13/2016  . Lower abdominal pain 01/13/2016  . Bacteremia 01/13/2016  . Hypotension 01/13/2016  . Palliative care encounter   . Goals of care, counseling/discussion   . Cough   . Encounter for hospice care discussion   . Hypoxia 01/09/2016  . Primary cancer of bronchus of right lower lobe ([place]) 11/29/2015  . Sepsis ([place]) 06/14/2015  . Acute lower UTI 06/14/2015    Class: Acute  . H/O malignant neoplasm 11/09/2014  . Acquired hypothyroidism 11/02/2014  . Cardiomyopathy ([place]) 11/02/2014  . Acute on chronic diastolic CHF (congestive heart failure) ([place]) 11/02/2014  . Diabetes mellitus without complication ([place]) 56/31/4970  . Cardiac defibrillator in place 11/02/2014  . Gastro-esophageal reflux disease without esophagitis 11/02/2014  . History of atrial fibrillation 11/02/2014  . BP (high blood pressure) 11/02/2014  . Neuropathy 11/02/2014  . Orthostasis 11/02/2014  . Pure hypercholesterolemia 11/02/2014  . Heart valve disease 11/02/2014    Past Surgical History:  Procedure Laterality Date  . APPENDECTOMY    . DUAL ICD IMPLANT  2007/2009   implantable cardiac defibrillator  . PORT-A-CATH REMOVAL      Prior to Admission medications   Medication Sig Start Date End Date Taking? Authorizing Provider  acidophilus (RISAQUAD) CAPS capsule Take 1 capsule by mouth daily.    [provider]  Ascorbic Acid (VITAMIN C) 1000 MG tablet  Take 500 mg by mouth 3 (three) times daily. Reported on 06/14/2015    [provider]  aspirin EC 81 MG tablet Take 81 mg by mouth daily.     [provider]  Cholecalciferol 5000 units TABS Take 5,000 Units by mouth every morning.    [provider]  Coenzyme Q10 (TH CO Q-10) 100 MG capsule Take 100 mg by mouth daily.     [provider]  [place] 250 MCG tablet Take 1 tablet (250 mcg total) by mouth daily. Patient taking differently: Take 125 mcg by mouth daily.  08/30/16   Hillary Bow, MD  furosemide (LASIX) 20 MG tablet Take 20 mg by mouth every other day as needed (as needed for water retention).  05/10/15   [provider]  gabapentin (NEURONTIN) 300 MG capsule Take by mouth 2 (two) times daily.     [provider]  guaiFENesin-codeine (ROBITUSSIN AC) 100-10 MG/5ML syrup Take 5 mLs by mouth 3 (three) times daily as needed for cough.    [provider]  guaiFENesin-dextromethorphan (ROBITUSSIN DM) 100-10 MG/5ML syrup Take 5 mLs by mouth every 4 (four) hours as needed for cough. 08/27/16   Hillary Bow, MD  hydrocortisone (CORTEF) 10 MG tablet Take 10 mg by mouth 2 (two) times daily.     [provider]  Levothyroxine Sodium 25 MCG CAPS Take by mouth daily before breakfast.     [provider]  MAGNESIUM-OXIDE 400 (241.3 Mg) MG tablet Take 400 mg by mouth daily.    [provider]  methenamine (HIPREX) 1 g tablet TAKE 1 TABLET BY MOUTH TWICE DAILY WITH MEALS 10/28/16   Hollice Espy, MD  midodrine (PROAMATINE) 10 MG tablet Take 1 tablet (10 mg total) by mouth 3 (three) times daily with meals. Patient taking differently: Take 10 mg by mouth 2 (two) times daily with a meal.  08/30/16   Hillary Bow, MD  Multiple Vitamin (MULTIVITAMIN WITH MINERALS) TABS tablet Take 1 tablet by mouth daily.    [provider]  ondansetron (ZOFRAN ODT) 4 MG disintegrating tablet Take 1 tablet (4 mg total) by mouth every 8 (eight) hours as needed for nausea or vomiting. 04/29/17   Alfred Levins, [place], MD  ondansetron (ZOFRAN) 4 MG tablet Take 1 tablet by mouth 3 (three) times daily. 03/23/17   [provider]  oseltamivir (TAMIFLU) 75 MG capsule Take 1 capsule (75 mg total) by mouth 2 (two) times daily for 5 days. 04/29/17 05/04/17  Rudene Re, MD  pantoprazole  (PROTONIX) 40 MG tablet Take 40 mg by mouth daily before breakfast.     [provider]  potassium chloride (MICRO-K) 10 MEQ CR capsule Take 10 mEq by mouth daily as needed (only with lasix).     [provider]  pyridostigmine (MESTINON) 60 MG tablet Take 60 mg by mouth 2 (two) times daily. 03/11/17   [provider]  ranitidine (ZANTAC) 300 MG capsule Take 300 mg by mouth every evening.    [provider]  SAW PALMETTO-PUMPKIN SEED OIL PO Take 1,000 mg by mouth daily.     [provider]  simvastatin (ZOCOR) 20 MG tablet Take 20 mg by mouth daily at 6 PM.     [provider]  vitamin B-12 (CYANOCOBALAMIN) 500 MCG tablet Take 500 mcg by mouth daily.     [provider]  vitamin E 400 UNIT capsule Take 400 Units by mouth daily.     [provider]  Allergies Penicillin g and Sulfa antibiotics  Family History  Problem Relation Age of Onset  . Hypertension Unknown   . Heart disease Unknown     Social History Social History   Tobacco Use  . Smoking status: Former Smoker    Types: Cigarettes    Last attempt to quit: 11/21/1981    Years since quitting: 35.4  . Smokeless tobacco: Never Used  Substance Use Topics  . Alcohol use: No    Alcohol/week: 0.0 oz  . Drug use: No    Review of Systems  Constitutional: Negative for fever. + generalized weakness Eyes: Negative for visual changes. ENT: Negative for sore throat. Neck: No neck pain  Cardiovascular: Negative for chest pain. Respiratory: + shortness of breath and cough Gastrointestinal: Negative for abdominal pain, vomiting or diarrhea. Genitourinary: Negative for dysuria. Musculoskeletal: Negative for back pain. Skin: Negative for rash. Neurological: Negative for headaches, weakness or numbness. Psych: No SI or HI  ____________________________________________   PHYSICAL EXAM:  VITAL SIGNS: Vitals:   04/29/17 1730 04/29/17 1800  BP: 106/71 113/67   Pulse: (!) 113 (!) 117  Resp: (!) 27 (!) 30  Temp:    SpO2: 94% 90%   Constitutional: Alert and oriented. Well appearing and in no apparent distress. HEENT:      Head: Normocephalic and atraumatic.         Eyes: Conjunctivae are normal. Sclera is non-icteric.       Mouth/Throat: Mucous membranes are moist.       Neck: Supple with no signs of meningismus. Cardiovascular: Regular rate and rhythm. No murmurs, gallops, or rubs. 2+ symmetrical distal pulses are present in all extremities. No JVD. Respiratory: Normal respiratory effort.  Satting well on chronic 2 L, severely diminished breath sounds on the right, normal on the left.   Gastrointestinal: Soft, non tender, and non distended with positive bowel sounds. No rebound or guarding. Genitourinary: No CVA tenderness. Musculoskeletal: Nontender with normal range of motion in all extremities. No edema, cyanosis, or erythema of extremities. Neurologic: Normal speech and language. Face is symmetric. Moving all extremities. No gross focal neurologic deficits are appreciated. Skin: Skin is warm, dry and intact. No rash noted. Psychiatric: Mood and affect are normal. Speech and behavior are normal.  ____________________________________________   LABS (all labs ordered are listed, but only abnormal results are displayed)  Labs Reviewed  CBC WITH DIFFERENTIAL/PLATELET - Abnormal; Notable for the following components:      Result Value   RDW 15.2 (*)    Neutro Abs 6.9 (*)    Lymphs Abs 0.6 (*)    Monocytes Absolute 1.3 (*)    All other components within normal limits  URINALYSIS, COMPLETE (UACMP) WITH MICROSCOPIC - Abnormal; Notable for the following components:   Color, Urine YELLOW (*)    APPearance CLEAR (*)    Leukocytes, UA SMALL (*)    Squamous Epithelial / LPF 0-5 (*)    All other components within normal limits  COMPREHENSIVE METABOLIC PANEL - Abnormal; Notable for the following components:   Glucose, Bld 137 (*)    BUN 23 (*)     ALT 14 (*)    All other components within normal limits  INFLUENZA PANEL BY PCR (TYPE A & B) - Abnormal; Notable for the following components:   Influenza A By PCR POSITIVE (*)    All other components within normal limits  URINE CULTURE  TROPONIN I  LACTIC ACID, PLASMA   ____________________________________________  EKG  ED ECG REPORT I,  Rudene Re, the attending physician, personally viewed and interpreted this ECG.  Ventricular paced rhythm, rate of 119, right bundle branch block, normal QTC, right axis deviation, no ST elevations or depressions.  Unchanged from prior from 2/19 ____________________________________________  RADIOLOGY  I have personally reviewed the images performed during this visit and I agree with the Radiologist's read.   Interpretation by Radiologist:  Dg Chest 2 View  Result Date: 04/29/2017 CLINICAL DATA:  Cough and shortness of Breath EXAM: CHEST - 2 VIEW COMPARISON:  03/27/2017 FINDINGS: Cardiac shadow is stable. Defibrillator is again noted. Postsurgical changes are seen. The left lung is clear with mild interstitial changes. Right lung again demonstrates diffuse chronic pleuroparenchymal changes similar to that seen on the prior exam. No acute abnormality is noted. IMPRESSION: Chronic changes without acute abnormality. Electronically Signed   By: Inez Catalina M.D.   On: 04/29/2017 15:53      ____________________________________________   PROCEDURES  Procedure(s) performed: None Procedures Critical Care performed:  None ____________________________________________   INITIAL IMPRESSION / ASSESSMENT AND PLAN / ED COURSE   83 y.o. male with a history of nonischemic cardiomyopathy EF 45%, AICD, afib, COPD on 2L [place], lung cancer on remission resents for evaluation of generalized weakness.  History is limited due to patient being a poor historian.  He looks well-appearing, vitals are within normal limits, normal sats on his chronic 2 L with  severely diminished breath sounds on the right but normal on the left.  Will check for flu, chest x-ray to rule out empyema, pneumonia, pleural effusion, pneumothorax.  EKG with no evidence of ischemia showing ventricular paced rhythm.  Will check troponin, labs, lactic.  We will monitor on telemetry.    _________________________ 6:22 PM on 04/29/2017 -----------------------------------------  workup consistent with influenza A. Patient was started on Tamiflu. He remains well appearing with normal work of breathing. Labs are unchanged from baseline. Chest x-ray with no evidence of pneumonia. No fever, elevated WBC or elevated lactic. Patient is stable for outpatient management. Recommend close follow-up with primary care doctor. Discussed return precautions with family. We'll discharge home on Zofran and Tamiflu.   As part of my medical decision making, I reviewed the following data within the [facility] notes reviewed and incorporated, Labs reviewed , EKG interpreted , Old EKG reviewed, Old chart reviewed, Radiograph reviewed , Notes from prior ED visits and  Controlled Substance Database    Pertinent labs & imaging results that were available during my care of the patient were reviewed by me and considered in my medical decision making (see chart for details).    ____________________________________________   FINAL CLINICAL IMPRESSION(S) / ED DIAGNOSES  Final diagnoses:  Influenza A      NEW MEDICATIONS STARTED DURING THIS VISIT:  ED Discharge Orders        Ordered    oseltamivir (TAMIFLU) 75 MG capsule  2 times daily     04/29/17 1821    ondansetron (ZOFRAN ODT) 4 MG disintegrating tablet  Every 8 hours PRN     04/29/17 1821       Note:  This document was prepared using Dragon voice recognition software and may include unintentional dictation errors.    Alfred Levins, [place], MD 04/29/17 [id number]

## 2017-04-29 NOTE — ED Notes (Signed)
Pt taken to car via wheelchair and once in the car began to cough and gag into emesis bag. No emesis produced. Offered to take pt back inside to treatment room. Family declined at this time. Told family to bring him back if needed that I was on shift all night. Family verbalized understanding.

## 2017-04-29 NOTE — ED Triage Notes (Signed)
Pt presents to ED via [facility]. Per EMS they were called out for respiratory distress, EMS reports upon their arrival pt c/o generalized body aches, upon arrival to ED pt c/o weakness, pt states he is unable to state any further symptoms and wants his daughter in law to state further symptoms at this time. Pt requesting, water, explained he could not have any water at this time, pt states, "You'd rather I die than give me any water huh?" This RN apologized and explained that he would need to be evaluated by MD prior to being given water.

## 2017-04-29 NOTE — ED Notes (Signed)
Pt moved to room 24. Pt's daughter went to lobby to get home catheter tip for in and out. Pt requested to leave door open, this RN explained that since patient had confirmed flu and was now in room it was important to limit spread of flu virus as much as possible. Pt states "I'm clausterphobic and I can't have the door closed!" This RN offered a mask and leave door open or close door and no mask. Pt appears visibly upset but with his daughter in law at bedside agrees to keep door closed. Pt placed on monitor. Will continue to monitor for further patient needs.

## 2017-04-29 NOTE — ED Notes (Signed)
Pt states +cough, denies vomiting, denies diarrhea, states difficulty breathing but no worse than his baseline, pt states chronic 2L via [place]. Pt denies chest pain. Pt c/o abdominal pain. Pt does not answer questions further than a yes or no.

## 2017-05-01 LAB — URINE CULTURE

## 2017-05-04 ENCOUNTER — Emergency Department: Payer: Medicare Other

## 2017-05-04 ENCOUNTER — Other Ambulatory Visit: Payer: Self-pay

## 2017-05-04 ENCOUNTER — Emergency Department
Admission: EM | Admit: 2017-05-04 | Discharge: 2017-05-04 | Disposition: A | Discharge status: Home / Self Care | Payer: Medicare Other | Attending: Emergency Medicine | Admitting: Emergency Medicine

## 2017-05-04 DIAGNOSIS — R1030 Lower abdominal pain, unspecified: Secondary | ICD-10-CM | POA: Diagnosis present

## 2017-05-04 DIAGNOSIS — Z7982 Long term (current) use of aspirin: Secondary | ICD-10-CM | POA: Diagnosis not present

## 2017-05-04 DIAGNOSIS — I110 Hypertensive heart disease with heart failure: Secondary | ICD-10-CM | POA: Diagnosis not present

## 2017-05-04 DIAGNOSIS — Z79899 Other long term (current) drug therapy: Secondary | ICD-10-CM | POA: Insufficient documentation

## 2017-05-04 DIAGNOSIS — I5032 Chronic diastolic (congestive) heart failure: Secondary | ICD-10-CM | POA: Insufficient documentation

## 2017-05-04 DIAGNOSIS — E1140 Type 2 diabetes mellitus with diabetic neuropathy, unspecified: Secondary | ICD-10-CM | POA: Insufficient documentation

## 2017-05-04 DIAGNOSIS — Z87891 Personal history of nicotine dependence: Secondary | ICD-10-CM | POA: Diagnosis not present

## 2017-05-04 DIAGNOSIS — K5900 Constipation, unspecified: Secondary | ICD-10-CM | POA: Insufficient documentation

## 2017-05-04 DIAGNOSIS — Z95810 Presence of automatic (implantable) cardiac defibrillator: Secondary | ICD-10-CM | POA: Diagnosis not present

## 2017-05-04 DIAGNOSIS — Z85118 Personal history of other malignant neoplasm of bronchus and lung: Secondary | ICD-10-CM | POA: Insufficient documentation

## 2017-05-04 LAB — COMPREHENSIVE METABOLIC PANEL
ALK PHOS: 88 U/L (ref 38–126)
ALT: 16 U/L — AB (ref 17–63)
AST: 28 U/L (ref 15–41)
Albumin: 4 g/dL (ref 3.5–5.0)
Anion gap: 9 (ref 5–15)
BILIRUBIN TOTAL: 0.6 mg/dL (ref 0.3–1.2)
BUN: 16 mg/dL (ref 6–20)
CALCIUM: 9.5 mg/dL (ref 8.9–10.3)
CO2: 29 mmol/L (ref 22–32)
CREATININE: 0.94 mg/dL (ref 0.61–1.24)
Chloride: 100 mmol/L — ABNORMAL LOW (ref 101–111)
GFR calc Af Amer: >60 mL/min (ref >60)
GFR calc non Af Amer: >60 mL/min (ref >60)
GLUCOSE: 130 mg/dL — AB (ref 65–99)
Potassium: 4.6 mmol/L (ref 3.5–5.1)
Sodium: 138 mmol/L (ref 135–145)
TOTAL PROTEIN: 8.6 g/dL — AB (ref 6.5–8.1)

## 2017-05-04 LAB — CBC
HEMATOCRIT: 43.9 % (ref 40.0–52.0)
Hemoglobin: 14.5 g/dL (ref 13.0–18.0)
MCH: 30.2 pg (ref 26.0–34.0)
MCHC: 33.1 g/dL (ref 32.0–36.0)
MCV: 91.3 fL (ref 80.0–100.0)
PLATELETS: 193 10*3/uL (ref 150–440)
RBC: 4.81 MIL/uL (ref 4.40–5.90)
RDW: 15.3 % — AB (ref 11.5–14.5)
WBC: 8.2 10*3/uL (ref 3.8–10.6)

## 2017-05-04 LAB — LIPASE, BLOOD: Lipase: 26 U/L (ref 11–51)

## 2017-05-04 MED ORDER — IOPAMIDOL (ISOVUE-300) INJECTION 61%
100.0000 mL | Freq: Once | INTRAVENOUS | Status: AC | PRN
  Administered 2017-05-04: 100 mL via INTRAVENOUS

## 2017-05-04 MED ORDER — ONDANSETRON HCL 4 MG/2ML IJ SOLN
4.0000 mg | Freq: Once | INTRAMUSCULAR | Status: AC
  Administered 2017-05-04: 4 mg via INTRAVENOUS
  Filled 2017-05-04: qty 2

## 2017-05-04 MED ORDER — MORPHINE SULFATE (PF) 4 MG/ML IV SOLN
4.0000 mg | Freq: Once | INTRAVENOUS | Status: AC
Start: 2017-05-04 — End: 2017-05-04
  Administered 2017-05-04: 4 mg via INTRAVENOUS
  Filled 2017-05-04: qty 1

## 2017-05-04 MED ORDER — IOPAMIDOL (ISOVUE-300) INJECTION 61%: 30.0000 mL | Freq: Once | INTRAVENOUS | Status: DC | PRN

## 2017-05-04 NOTE — ED Triage Notes (Signed)
Pt arrives [facility] from home for constipation x 1 week. Was seen last week for flu, si on 2 L nasal cannula at home. 99.9 axillary for EMS. Pt has pacemaker. CBG 143. Alert and oriented. Speaking in full sentences. EMS gave 4mg  zofran IM d/t pt stating he was nauseous.

## 2017-05-04 NOTE — ED Notes (Signed)
Pt incontinent of stool x 2. Large amount of brown liquid stool and large stool burden noted to bed. Pt cleaned with warm wipes, all linen and bedding and chucks replaced with clean ones.

## 2017-05-04 NOTE — ED Notes (Signed)
Pt in CT.

## 2017-05-04 NOTE — ED Notes (Signed)
Pt informed CT tech that he would not be able to drink oral contrast. Dr. Archie Balboa notified.

## 2017-05-04 NOTE — Discharge Instructions (Addendum)
Please seek medical attention for any high fevers, chest pain, shortness of breath, change in behavior, persistent vomiting, bloody stool or any other new or concerning symptoms.  

## 2017-05-04 NOTE — ED Provider Notes (Signed)
[facility] Emergency Department Provider Note   ____________________________________________   I have reviewed the triage vital signs and the nursing notes.   HISTORY  Chief Complaint Constipation   History limited by: Not Limited   HPI Russell Howard is a 83 y.o. male who presents to the emergency department today via EMS because of concern for constipation. The patient states that he has gone roughly 1 week without a bowel movement. He is not sure if he has been passing gas. This has been accompanied by abdominal pain. He describes it as being located in the lower abdomen. It is an 8/10. It has been fairly constant. He has had associated nausea. He denies any fevers. Does have a history of appendectomy as a child.    Per medical record review patient has a history of appendectomy.  Past Medical History:  Diagnosis Date  . Acquired hypothyroidism 11/02/2014  . BPH (benign prostatic hyperplasia)   . Cardiac defibrillator in place 11/02/2014  . Cardiomyopathy ([place])   . CHF (congestive heart failure) ([place])   . Chronic diastolic heart failure ([place]) 11/02/2014  . COPD (chronic obstructive pulmonary disease) ([place])   . Gastro-esophageal reflux disease without esophagitis 11/02/2014  . H/O malignant neoplasm 11/09/2014  . Heart valve disease 11/02/2014  . History of atrial fibrillation 11/02/2014  . Lung cancer ([place])   . Neuropathy 11/02/2014  . Orthostasis 11/02/2014  . Pure hypercholesterolemia 11/02/2014  . Valvular heart disease     Patient Active Problem List   Diagnosis Date Noted  . UTI (urinary tract infection) 11/17/2016  . Empyema ([place])   . Weakness   . Community acquired pneumonia   . Pleural effusion   . Palliative care by specialist   . Acute on chronic respiratory failure with hypoxia ([place]) 08/19/2016  . Acute respiratory failure with hypoxia ([place]) 08/19/2016  . Septic shock ([place]) 03/02/2016  . Hydronephrosis, right 01/14/2016  . Constipation  01/13/2016  . Urinary retention 01/13/2016  . Lower abdominal pain 01/13/2016  . Bacteremia 01/13/2016  . Hypotension 01/13/2016  . Palliative care encounter   . Goals of care, counseling/discussion   . Cough   . Encounter for hospice care discussion   . Hypoxia 01/09/2016  . Primary cancer of bronchus of right lower lobe ([place]) 11/29/2015  . Sepsis ([place]) 06/14/2015  . Acute lower UTI 06/14/2015    Class: Acute  . H/O malignant neoplasm 11/09/2014  . Acquired hypothyroidism 11/02/2014  . Cardiomyopathy ([place]) 11/02/2014  . Acute on chronic diastolic CHF (congestive heart failure) ([place]) 11/02/2014  . Diabetes mellitus without complication ([place]) 73/41/9379  . Cardiac defibrillator in place 11/02/2014  . Gastro-esophageal reflux disease without esophagitis 11/02/2014  . History of atrial fibrillation 11/02/2014  . BP (high blood pressure) 11/02/2014  . Neuropathy 11/02/2014  . Orthostasis 11/02/2014  . Pure hypercholesterolemia 11/02/2014  . Heart valve disease 11/02/2014    Past Surgical History:  Procedure Laterality Date  . APPENDECTOMY    . DUAL ICD IMPLANT  2007/2009   implantable cardiac defibrillator  . PORT-A-CATH REMOVAL      Prior to Admission medications   Medication Sig Start Date End Date Taking? Authorizing Provider  acidophilus (RISAQUAD) CAPS capsule Take 1 capsule by mouth daily.    [provider]  Ascorbic Acid (VITAMIN C) 1000 MG tablet Take 500 mg by mouth 3 (three) times daily. Reported on 06/14/2015    [provider]  aspirin EC 81 MG tablet Take 81 mg by mouth daily.  [provider]  Cholecalciferol 5000 units TABS Take 5,000 Units by mouth every morning.    [provider]  Coenzyme Q10 (TH CO Q-10) 100 MG capsule Take 100 mg by mouth daily.     [provider]  [place] 250 MCG tablet Take 1 tablet (250 mcg total) by mouth daily. Patient taking differently: Take 125 mcg by mouth daily.  08/30/16   Hillary Bow, MD  furosemide (LASIX) 20 MG tablet Take 20 mg by mouth every other day as needed (as needed for water retention).  05/10/15   [provider]  gabapentin (NEURONTIN) 300 MG capsule Take by mouth 2 (two) times daily.     [provider]  guaiFENesin-codeine (ROBITUSSIN AC) 100-10 MG/5ML syrup Take 5 mLs by mouth 3 (three) times daily as needed for cough.    [provider]  guaiFENesin-dextromethorphan (ROBITUSSIN DM) 100-10 MG/5ML syrup Take 5 mLs by mouth every 4 (four) hours as needed for cough. 08/27/16   Hillary Bow, MD  hydrocortisone (CORTEF) 10 MG tablet Take 10 mg by mouth 2 (two) times daily.     [provider]  Levothyroxine Sodium 25 MCG CAPS Take by mouth daily before breakfast.     [provider]  MAGNESIUM-OXIDE 400 (241.3 Mg) MG tablet Take 400 mg by mouth daily.    [provider]  methenamine (HIPREX) 1 g tablet TAKE 1 TABLET BY MOUTH TWICE DAILY WITH MEALS 10/28/16   Hollice Espy, MD  midodrine (PROAMATINE) 10 MG tablet Take 1 tablet (10 mg total) by mouth 3 (three) times daily with meals. Patient taking differently: Take 10 mg by mouth 2 (two) times daily with a meal.  08/30/16   Hillary Bow, MD  Multiple Vitamin (MULTIVITAMIN WITH MINERALS) TABS tablet Take 1 tablet by mouth daily.    [provider]  ondansetron (ZOFRAN ODT) 4 MG disintegrating tablet Take 1 tablet (4 mg total) by mouth every 8 (eight) hours as needed for nausea or vomiting. 04/29/17   Alfred Levins, [place], MD  ondansetron (ZOFRAN) 4 MG tablet Take 1 tablet by mouth 3 (three) times daily. 03/23/17   [provider]  oseltamivir (TAMIFLU) 75 MG capsule Take 1 capsule (75 mg total) by mouth 2 (two) times daily for 5 days. 04/29/17 05/04/17  Rudene Re, MD  pantoprazole (PROTONIX) 40 MG tablet Take 40 mg by mouth daily before breakfast.     [provider]  potassium chloride (MICRO-K) 10 MEQ CR capsule Take 10 mEq by  mouth daily as needed (only with lasix).     [provider]  pyridostigmine (MESTINON) 60 MG tablet Take 60 mg by mouth 2 (two) times daily. 03/11/17   [provider]  ranitidine (ZANTAC) 300 MG capsule Take 300 mg by mouth every evening.    [provider]  SAW PALMETTO-PUMPKIN SEED OIL PO Take 1,000 mg by mouth daily.     [provider]  simvastatin (ZOCOR) 20 MG tablet Take 20 mg by mouth daily at 6 PM.     [provider]  vitamin B-12 (CYANOCOBALAMIN) 500 MCG tablet Take 500 mcg by mouth daily.     [provider]  vitamin E 400 UNIT capsule Take 400 Units by mouth daily.     [provider]    Allergies Penicillin g and Sulfa antibiotics  Family History  Problem Relation Age of Onset  . Hypertension Unknown   . Heart disease Unknown     Social History Social  History   Tobacco Use  . Smoking status: Former Smoker    Types: Cigarettes    Last attempt to quit: 11/21/1981    Years since quitting: 35.4  . Smokeless tobacco: Never Used  Substance Use Topics  . Alcohol use: No    Alcohol/week: 0.0 oz  . Drug use: No    Review of Systems Constitutional: No fever/chills Eyes: No visual changes. ENT: No sore throat. Cardiovascular: Denies chest pain. Respiratory: Denies shortness of breath. Gastrointestinal: Positive for abdominal pain and constipation. Genitourinary: Negative for dysuria. Musculoskeletal: Negative for back pain. Skin: Negative for rash. Neurological: Negative for headaches, focal weakness or numbness.  ____________________________________________   PHYSICAL EXAM:  VITAL SIGNS: ED Triage Vitals  Enc Vitals Group     BP 05/04/17 1927 114/86     Pulse Rate 05/04/17 1927 (!) 109     Resp 05/04/17 1927 (!) 21     Temp 05/04/17 1930 97.7 F (36.5 C)     Temp Source 05/04/17 1930 Oral     SpO2 05/04/17 1927 92 %     Weight 05/04/17 1928 170 lb (77.1 kg)     Height 05/04/17 1928 5\' 10"   (1.778 m)     Head Circumference --      Peak Flow --      Pain Score 05/04/17 1929 8   Constitutional: Alert and oriented. Well appearing and in no distress. Eyes: Conjunctivae are normal.  ENT   Head: Normocephalic and atraumatic.   Nose: No congestion/rhinnorhea.   Mouth/Throat: Mucous membranes are moist.   Neck: No stridor. Hematological/Lymphatic/Immunilogical: No cervical lymphadenopathy. Cardiovascular: Normal rate, regular rhythm.  No murmurs, rubs, or gallops.  Respiratory: Normal respiratory effort without tachypnea nor retractions. Breath sounds are clear and equal bilaterally. No wheezes/rales/rhonchi. Gastrointestinal: Soft and somewhat diffusely tender. Mildly tympanitic.  Genitourinary: Deferred Musculoskeletal: Normal range of motion in all extremities. No lower extremity edema. Neurologic:  Normal speech and language. No gross focal neurologic deficits are appreciated.  Skin:  Skin is warm, dry and intact. No rash noted. Psychiatric: Mood and affect are normal. Speech and behavior are normal. Patient exhibits appropriate insight and judgment.  ____________________________________________    LABS (pertinent positives/negatives)  Lipase 26 CMP na 138, k 4.6, cr 0.94 CBC wnl except rdw 15.3   ____________________________________________   EKG  None  ____________________________________________    RADIOLOGY  CT abd pel Large amount of stool.   ____________________________________________   PROCEDURES  Procedures  ____________________________________________   INITIAL IMPRESSION / ASSESSMENT AND PLAN / ED COURSE  Pertinent labs & imaging results that were available during my care of the patient were reviewed by me and considered in my medical decision making (see chart for details).  Patient CT scan is consistent with constipation.  While patient was here he did have large bowel movements and had good resolution of his pain.  This  point I do think constipation likely the etiology of the patient's symptoms. Discussed care of constipation with patient and family.   ____________________________________________   FINAL CLINICAL IMPRESSION(S) / ED DIAGNOSES  Final diagnoses:  Constipation, unspecified constipation type  Lower abdominal pain     Note: This dictation was prepared with Dragon dictation. Any transcriptional errors that result from this process are unintentional     Nance Pear, MD 05/04/17 2356

## 2017-05-22 ENCOUNTER — Emergency Department: Payer: Medicare Other

## 2017-05-22 ENCOUNTER — Other Ambulatory Visit: Payer: Self-pay

## 2017-05-22 ENCOUNTER — Encounter: Payer: Self-pay | Admitting: Emergency Medicine

## 2017-05-22 ENCOUNTER — Inpatient Hospital Stay
Admission: EM | Admit: 2017-05-22 | Discharge: 2017-05-29 | DRG: 871 | Disposition: A | Discharge status: Home / Self Care | Payer: Medicare Other | Attending: Internal Medicine | Admitting: Internal Medicine

## 2017-05-22 DIAGNOSIS — I9589 Other hypotension: Secondary | ICD-10-CM | POA: Diagnosis present

## 2017-05-22 DIAGNOSIS — Z95810 Presence of automatic (implantable) cardiac defibrillator: Secondary | ICD-10-CM

## 2017-05-22 DIAGNOSIS — J9621 Acute and chronic respiratory failure with hypoxia: Secondary | ICD-10-CM | POA: Diagnosis present

## 2017-05-22 DIAGNOSIS — K219 Gastro-esophageal reflux disease without esophagitis: Secondary | ICD-10-CM | POA: Diagnosis present

## 2017-05-22 DIAGNOSIS — Z66 Do not resuscitate: Secondary | ICD-10-CM | POA: Diagnosis present

## 2017-05-22 DIAGNOSIS — Z923 Personal history of irradiation: Secondary | ICD-10-CM

## 2017-05-22 DIAGNOSIS — I429 Cardiomyopathy, unspecified: Secondary | ICD-10-CM | POA: Diagnosis present

## 2017-05-22 DIAGNOSIS — R0789 Other chest pain: Secondary | ICD-10-CM | POA: Diagnosis present

## 2017-05-22 DIAGNOSIS — I2782 Chronic pulmonary embolism: Secondary | ICD-10-CM | POA: Diagnosis present

## 2017-05-22 DIAGNOSIS — J449 Chronic obstructive pulmonary disease, unspecified: Secondary | ICD-10-CM | POA: Diagnosis present

## 2017-05-22 DIAGNOSIS — R0600 Dyspnea, unspecified: Secondary | ICD-10-CM | POA: Diagnosis not present

## 2017-05-22 DIAGNOSIS — Z85118 Personal history of other malignant neoplasm of bronchus and lung: Secondary | ICD-10-CM

## 2017-05-22 DIAGNOSIS — R531 Weakness: Secondary | ICD-10-CM

## 2017-05-22 DIAGNOSIS — Z9981 Dependence on supplemental oxygen: Secondary | ICD-10-CM

## 2017-05-22 DIAGNOSIS — Z880 Allergy status to penicillin: Secondary | ICD-10-CM | POA: Diagnosis not present

## 2017-05-22 DIAGNOSIS — N401 Enlarged prostate with lower urinary tract symptoms: Secondary | ICD-10-CM | POA: Diagnosis present

## 2017-05-22 DIAGNOSIS — I480 Paroxysmal atrial fibrillation: Secondary | ICD-10-CM | POA: Diagnosis present

## 2017-05-22 DIAGNOSIS — E785 Hyperlipidemia, unspecified: Secondary | ICD-10-CM | POA: Diagnosis present

## 2017-05-22 DIAGNOSIS — Z87891 Personal history of nicotine dependence: Secondary | ICD-10-CM | POA: Diagnosis not present

## 2017-05-22 DIAGNOSIS — Z7901 Long term (current) use of anticoagulants: Secondary | ICD-10-CM

## 2017-05-22 DIAGNOSIS — Z882 Allergy status to sulfonamides status: Secondary | ICD-10-CM

## 2017-05-22 DIAGNOSIS — A419 Sepsis, unspecified organism: Secondary | ICD-10-CM

## 2017-05-22 DIAGNOSIS — I5032 Chronic diastolic (congestive) heart failure: Secondary | ICD-10-CM | POA: Diagnosis present

## 2017-05-22 DIAGNOSIS — I248 Other forms of acute ischemic heart disease: Secondary | ICD-10-CM | POA: Diagnosis present

## 2017-05-22 DIAGNOSIS — Z952 Presence of prosthetic heart valve: Secondary | ICD-10-CM

## 2017-05-22 DIAGNOSIS — J849 Interstitial pulmonary disease, unspecified: Secondary | ICD-10-CM | POA: Diagnosis present

## 2017-05-22 DIAGNOSIS — N3000 Acute cystitis without hematuria: Secondary | ICD-10-CM | POA: Diagnosis present

## 2017-05-22 DIAGNOSIS — E039 Hypothyroidism, unspecified: Secondary | ICD-10-CM | POA: Diagnosis present

## 2017-05-22 DIAGNOSIS — E860 Dehydration: Secondary | ICD-10-CM | POA: Diagnosis present

## 2017-05-22 DIAGNOSIS — G629 Polyneuropathy, unspecified: Secondary | ICD-10-CM | POA: Diagnosis present

## 2017-05-22 DIAGNOSIS — R609 Edema, unspecified: Secondary | ICD-10-CM

## 2017-05-22 DIAGNOSIS — Z79899 Other long term (current) drug therapy: Secondary | ICD-10-CM

## 2017-05-22 DIAGNOSIS — R338 Other retention of urine: Secondary | ICD-10-CM | POA: Diagnosis present

## 2017-05-22 DIAGNOSIS — Z7982 Long term (current) use of aspirin: Secondary | ICD-10-CM

## 2017-05-22 DIAGNOSIS — Z7952 Long term (current) use of systemic steroids: Secondary | ICD-10-CM

## 2017-05-22 DIAGNOSIS — J701 Chronic and other pulmonary manifestations due to radiation: Secondary | ICD-10-CM | POA: Diagnosis present

## 2017-05-22 DIAGNOSIS — A4102 Sepsis due to Methicillin resistant Staphylococcus aureus: Principal | ICD-10-CM | POA: Diagnosis present

## 2017-05-22 DIAGNOSIS — Z792 Long term (current) use of antibiotics: Secondary | ICD-10-CM

## 2017-05-22 LAB — COMPREHENSIVE METABOLIC PANEL
ALK PHOS: 77 U/L (ref 38–126)
ALT: 19 U/L (ref 17–63)
AST: 30 U/L (ref 15–41)
Albumin: 3.7 g/dL (ref 3.5–5.0)
Anion gap: 7 (ref 5–15)
BUN: 18 mg/dL (ref 6–20)
CALCIUM: 9 mg/dL (ref 8.9–10.3)
CO2: 28 mmol/L (ref 22–32)
CREATININE: 0.88 mg/dL (ref 0.61–1.24)
Chloride: 100 mmol/L — ABNORMAL LOW (ref 101–111)
Glucose, Bld: 120 mg/dL — ABNORMAL HIGH (ref 65–99)
Potassium: 4.6 mmol/L (ref 3.5–5.1)
Sodium: 135 mmol/L (ref 135–145)
Total Bilirubin: 0.6 mg/dL (ref 0.3–1.2)
Total Protein: 8 g/dL (ref 6.5–8.1)

## 2017-05-22 LAB — LACTIC ACID, PLASMA: Lactic Acid, Venous: 1.4 mmol/L (ref 0.5–1.9)

## 2017-05-22 LAB — URINALYSIS, COMPLETE (UACMP) WITH MICROSCOPIC
BILIRUBIN URINE: NEGATIVE
Glucose, UA: NEGATIVE mg/dL
HGB URINE DIPSTICK: NEGATIVE
Ketones, ur: NEGATIVE mg/dL
NITRITE: NEGATIVE
PH: 7 (ref 5.0–8.0)
Protein, ur: NEGATIVE mg/dL
SPECIFIC GRAVITY, URINE: 1.013 (ref 1.005–1.030)

## 2017-05-22 LAB — CBC
HCT: 42.7 % (ref 40.0–52.0)
HEMOGLOBIN: 14.3 g/dL (ref 13.0–18.0)
MCH: 30.4 pg (ref 26.0–34.0)
MCHC: 33.5 g/dL (ref 32.0–36.0)
MCV: 90.7 fL (ref 80.0–100.0)
PLATELETS: 183 10*3/uL (ref 150–440)
RBC: 4.72 MIL/uL (ref 4.40–5.90)
RDW: 15.4 % — ABNORMAL HIGH (ref 11.5–14.5)
WBC: 7.8 10*3/uL (ref 3.8–10.6)

## 2017-05-22 LAB — TSH: TSH: 2.678 u[IU]/mL (ref 0.350–4.500)

## 2017-05-22 MED ORDER — VANCOMYCIN HCL IN DEXTROSE 1-5 GM/200ML-% IV SOLN
1000.0000 mg | Freq: Once | INTRAVENOUS | Status: AC
  Administered 2017-05-22: 1000 mg via INTRAVENOUS
  Filled 2017-05-22: qty 200

## 2017-05-22 MED ORDER — PANTOPRAZOLE SODIUM 40 MG PO TBEC
40.0000 mg | DELAYED_RELEASE_TABLET | Freq: Every day | ORAL | Status: DC
  Administered 2017-05-22 – 2017-05-29 (×7): 40 mg via ORAL
  Filled 2017-05-22 (×8): qty 1

## 2017-05-22 MED ORDER — SODIUM CHLORIDE 0.9 % IV BOLUS
1000.0000 mL | Freq: Once | INTRAVENOUS | Status: AC
  Administered 2017-05-22: 1000 mL via INTRAVENOUS

## 2017-05-22 MED ORDER — VITAMIN D 1000 UNITS PO TABS
5000.0000 [IU] | ORAL_TABLET | Freq: Every day | ORAL | Status: DC
  Administered 2017-05-23 – 2017-05-29 (×6): 5000 [IU] via ORAL
  Filled 2017-05-22 (×7): qty 5

## 2017-05-22 MED ORDER — METHENAMINE MANDELATE 1 G PO TABS
1000.0000 mg | ORAL_TABLET | Freq: Two times a day (BID) | ORAL | Status: DC
  Administered 2017-05-22 – 2017-05-29 (×13): 1000 mg via ORAL
  Filled 2017-05-22 (×15): qty 1

## 2017-05-22 MED ORDER — RISAQUAD PO CAPS
1.0000 | ORAL_CAPSULE | Freq: Every evening | ORAL | Status: DC
Start: 2017-05-22 — End: 2017-05-29
  Administered 2017-05-22 – 2017-05-28 (×7): 1 via ORAL
  Filled 2017-05-22 (×7): qty 1

## 2017-05-22 MED ORDER — ACETAMINOPHEN 325 MG PO TABS: 650.0000 mg | ORAL_TABLET | Freq: Four times a day (QID) | ORAL | Status: DC | PRN

## 2017-05-22 MED ORDER — ONDANSETRON HCL 4 MG PO TABS
4.0000 mg | ORAL_TABLET | Freq: Four times a day (QID) | ORAL | Status: DC | PRN
Start: 2017-05-22 — End: 2017-05-29

## 2017-05-22 MED ORDER — SODIUM CHLORIDE 0.9 % IV SOLN
2.0000 g | Freq: Once | INTRAVENOUS | Status: AC
  Administered 2017-05-22: 2 g via INTRAVENOUS
  Filled 2017-05-22: qty 2

## 2017-05-22 MED ORDER — VITAMIN B-12 1000 MCG PO TABS
500.0000 ug | ORAL_TABLET | Freq: Every day | ORAL | Status: DC
  Administered 2017-05-22 – 2017-05-29 (×7): 500 ug via ORAL
  Filled 2017-05-22 (×7): qty 1

## 2017-05-22 MED ORDER — LEVOTHYROXINE SODIUM 100 MCG PO TABS: 100.0000 ug | ORAL_TABLET | Freq: Every day | ORAL | Status: DC

## 2017-05-22 MED ORDER — CO Q-10 120 MG PO CAPS: 120.0000 mg | ORAL_CAPSULE | Freq: Every evening | ORAL | Status: DC

## 2017-05-22 MED ORDER — LEVOFLOXACIN IN D5W 750 MG/150ML IV SOLN: 750.0000 mg | INTRAVENOUS | Status: DC

## 2017-05-22 MED ORDER — SODIUM CHLORIDE 0.9 % IV SOLN: 2.0000 g | Freq: Three times a day (TID) | INTRAVENOUS | Status: DC

## 2017-05-22 MED ORDER — LEVOFLOXACIN IN D5W 750 MG/150ML IV SOLN
750.0000 mg | INTRAVENOUS | Status: DC
  Administered 2017-05-23 – 2017-05-24 (×2): 750 mg via INTRAVENOUS
  Filled 2017-05-22 (×2): qty 150

## 2017-05-22 MED ORDER — ONDANSETRON HCL 4 MG/2ML IJ SOLN
4.0000 mg | Freq: Once | INTRAMUSCULAR | Status: AC
  Administered 2017-05-22: 4 mg via INTRAVENOUS

## 2017-05-22 MED ORDER — ONDANSETRON HCL 4 MG/2ML IJ SOLN
INTRAMUSCULAR | Status: AC
  Administered 2017-05-22: 4 mg via INTRAVENOUS
  Filled 2017-05-22: qty 2

## 2017-05-22 MED ORDER — MAGNESIUM OXIDE 400 (241.3 MG) MG PO TABS
400.0000 mg | ORAL_TABLET | Freq: Every evening | ORAL | Status: DC
  Administered 2017-05-22 – 2017-05-28 (×6): 400 mg via ORAL
  Filled 2017-05-22 (×6): qty 1

## 2017-05-22 MED ORDER — DIGOXIN 125 MCG PO TABS
0.1250 mg | ORAL_TABLET | Freq: Every day | ORAL | Status: DC
  Administered 2017-05-22 – 2017-05-29 (×6): 0.125 mg via ORAL
  Filled 2017-05-22 (×9): qty 1

## 2017-05-22 MED ORDER — ENOXAPARIN SODIUM 40 MG/0.4ML ~~LOC~~ SOLN
40.0000 mg | SUBCUTANEOUS | Status: DC
  Administered 2017-05-22: 40 mg via SUBCUTANEOUS
  Filled 2017-05-22: qty 0.4

## 2017-05-22 MED ORDER — VITAMIN C 500 MG PO TABS
500.0000 mg | ORAL_TABLET | Freq: Two times a day (BID) | ORAL | Status: DC
  Administered 2017-05-22 – 2017-05-29 (×11): 500 mg via ORAL
  Filled 2017-05-22 (×15): qty 1

## 2017-05-22 MED ORDER — ADULT MULTIVITAMIN W/MINERALS CH
1.0000 | ORAL_TABLET | Freq: Every day | ORAL | Status: DC
Start: 2017-05-22 — End: 2017-05-29
  Administered 2017-05-22 – 2017-05-29 (×5): 1 via ORAL
  Filled 2017-05-22 (×5): qty 1

## 2017-05-22 MED ORDER — GUAIFENESIN 100 MG/5ML PO SOLN
5.0000 mL | ORAL | Status: DC | PRN
  Administered 2017-05-23: 100 mg via ORAL
  Filled 2017-05-22 (×3): qty 5

## 2017-05-22 MED ORDER — SIMVASTATIN 20 MG PO TABS
20.0000 mg | ORAL_TABLET | Freq: Every evening | ORAL | Status: DC
  Administered 2017-05-22 – 2017-05-28 (×7): 20 mg via ORAL
  Filled 2017-05-22 (×7): qty 1

## 2017-05-22 MED ORDER — FAMOTIDINE 20 MG PO TABS
20.0000 mg | ORAL_TABLET | Freq: Every day | ORAL | Status: DC
  Administered 2017-05-22 – 2017-05-29 (×6): 20 mg via ORAL
  Filled 2017-05-22 (×7): qty 1

## 2017-05-22 MED ORDER — LEVOFLOXACIN IN D5W 750 MG/150ML IV SOLN
750.0000 mg | Freq: Once | INTRAVENOUS | Status: AC
  Administered 2017-05-22: 750 mg via INTRAVENOUS
  Filled 2017-05-22: qty 150

## 2017-05-22 MED ORDER — ACETAMINOPHEN 650 MG RE SUPP: 650.0000 mg | Freq: Four times a day (QID) | RECTAL | Status: DC | PRN

## 2017-05-22 MED ORDER — HYDROCORTISONE 10 MG PO TABS
10.0000 mg | ORAL_TABLET | Freq: Two times a day (BID) | ORAL | Status: DC
  Administered 2017-05-22 – 2017-05-23 (×3): 10 mg via ORAL
  Filled 2017-05-22 (×4): qty 1

## 2017-05-22 MED ORDER — VANCOMYCIN HCL IN DEXTROSE 1-5 GM/200ML-% IV SOLN: 1000.0000 mg | Freq: Two times a day (BID) | INTRAVENOUS | Status: DC

## 2017-05-22 MED ORDER — PYRIDOSTIGMINE BROMIDE 60 MG PO TABS
60.0000 mg | ORAL_TABLET | Freq: Two times a day (BID) | ORAL | Status: DC
  Administered 2017-05-22 – 2017-05-29 (×13): 60 mg via ORAL
  Filled 2017-05-22 (×15): qty 1

## 2017-05-22 MED ORDER — MIDODRINE HCL 5 MG PO TABS
10.0000 mg | ORAL_TABLET | Freq: Two times a day (BID) | ORAL | Status: DC
  Administered 2017-05-22 – 2017-05-23 (×2): 10 mg via ORAL
  Filled 2017-05-22 (×2): qty 2

## 2017-05-22 MED ORDER — IPRATROPIUM-ALBUTEROL 0.5-2.5 (3) MG/3ML IN SOLN
3.0000 mL | Freq: Four times a day (QID) | RESPIRATORY_TRACT | Status: DC
  Administered 2017-05-22: 3 mL via RESPIRATORY_TRACT
  Filled 2017-05-22 (×3): qty 3

## 2017-05-22 MED ORDER — ONDANSETRON HCL 4 MG/2ML IJ SOLN
4.0000 mg | Freq: Four times a day (QID) | INTRAMUSCULAR | Status: DC | PRN
Start: 2017-05-22 — End: 2017-05-29
  Administered 2017-05-22 – 2017-05-26 (×5): 4 mg via INTRAVENOUS
  Filled 2017-05-22 (×6): qty 2

## 2017-05-22 MED ORDER — GABAPENTIN 600 MG PO TABS
600.0000 mg | ORAL_TABLET | Freq: Two times a day (BID) | ORAL | Status: DC
  Administered 2017-05-22 – 2017-05-29 (×13): 600 mg via ORAL
  Filled 2017-05-22 (×13): qty 1

## 2017-05-22 MED ORDER — ASPIRIN EC 81 MG PO TBEC
81.0000 mg | DELAYED_RELEASE_TABLET | Freq: Every day | ORAL | Status: DC
  Administered 2017-05-22 – 2017-05-29 (×7): 81 mg via ORAL
  Filled 2017-05-22 (×7): qty 1

## 2017-05-22 MED ORDER — VITAMIN E 180 MG (400 UNIT) PO CAPS
400.0000 [IU] | ORAL_CAPSULE | Freq: Every evening | ORAL | Status: DC
  Administered 2017-05-22 – 2017-05-28 (×6): 400 [IU] via ORAL
  Filled 2017-05-22 (×9): qty 1

## 2017-05-22 NOTE — ED Notes (Signed)
Patient transported to CT 

## 2017-05-22 NOTE — ED Triage Notes (Signed)
Pt in via [facility] from home, reports increasing weakness.  Pt self caths twice a day, recently treated for UTI.  Pt denies any pain at this time.  Pt with pace maker.

## 2017-05-22 NOTE — H&P (Signed)
[place] [facility] [place] NAME: Russell Howard    MR#:  [id number]  DATE OF BIRTH:  Aug 12, 1934  DATE OF ADMISSION:  05/22/2017  PRIMARY CARE PHYSICIAN: [name], MD   REQUESTING/REFERRING PHYSICIAN: Dr. Marjean Donna  CHIEF COMPLAINT:   Chief Complaint  Patient presents with  . Weakness    HISTORY OF PRESENT ILLNESS:  Russell Howard  is a 83 y.o. male with a known history of cardiomyopathy with EF of 45%, COPD on 2 L home oxygen, remote history of smoking, paroxysmal A. fib, hyperlipidemia, valvular heart disease, benign prostatic hypertrophy requiring self-catheterization twice a day presents to hospital secondary to worsening weakness and sleepiness. Most of the history is obtained from his daughter-in-law who takes care of him at home.  According to her, over the last week patient has been getting much weaker.  Since yesterday he was lethargic and sleeping most of the time.  He ambulates with a walker at baseline.  They self catheterizing twice a day, urine was noted to have more sediment yesterday.  No fevers but had hot and cold chills all day yesterday.  He was hypotensive here, but however has baseline hypotension on hydrocortisone and Midodrine. Breathing is worse than baseline.  Still on 2 L oxygen.  No worsening in his cough.  PAST MEDICAL HISTORY:   Past Medical History:  Diagnosis Date  . Acquired hypothyroidism 11/02/2014  . BPH (benign prostatic hyperplasia)   . Cardiac defibrillator in place 11/02/2014  . Cardiomyopathy ([place])   . CHF (congestive heart failure) ([place])   . Chronic diastolic heart failure ([place]) 11/02/2014  . COPD (chronic obstructive pulmonary disease) ([place])   . Gastro-esophageal reflux disease without esophagitis 11/02/2014  . H/O malignant neoplasm 11/09/2014  . Heart valve disease 11/02/2014  . History of atrial fibrillation 11/02/2014  . Lung cancer ([place])   . Neuropathy 11/02/2014  . Orthostasis 11/02/2014  .  Pure hypercholesterolemia 11/02/2014  . Valvular heart disease     PAST SURGICAL HISTORY:   Past Surgical History:  Procedure Laterality Date  . APPENDECTOMY    . CARDIAC VALVE REPLACEMENT     with a bovine valve  . DUAL ICD IMPLANT  2007/2009   implantable cardiac defibrillator  . PORT-A-CATH REMOVAL      SOCIAL HISTORY:   Social History   Tobacco Use  . Smoking status: Former Smoker    Types: Cigarettes    Last attempt to quit: 11/21/1981    Years since quitting: 35.5  . Smokeless tobacco: Never Used  Substance Use Topics  . Alcohol use: No    Alcohol/week: 0.0 oz    FAMILY HISTORY:   Family History  Problem Relation Age of Onset  . Hypertension Unknown   . Heart disease Unknown   . CAD Father     DRUG ALLERGIES:   Allergies  Allergen Reactions  . Penicillin G Hives    Has patient had a PCN reaction causing immediate rash, facial/tongue/throat swelling, SOB or lightheadedness with hypotension: Yes Has patient had a PCN reaction causing severe rash involving mucus membranes or skin necrosis: No Has patient had a PCN reaction that required hospitalization No Has patient had a PCN reaction occurring within the last 10 years: No If all of the above answers are "NO", then may proceed with Cephalosporin use.  . Sulfa Antibiotics Rash    REVIEW OF SYSTEMS:   Review of Systems  Constitutional: Positive for chills and malaise/fatigue. Negative for  fever and weight loss.  HENT: Negative for ear discharge, ear pain, nosebleeds and tinnitus.   Eyes: Negative for blurred vision, double vision and photophobia.  Respiratory: Positive for cough and shortness of breath. Negative for hemoptysis and wheezing.   Cardiovascular: Negative for chest pain, palpitations, orthopnea and leg swelling.  Gastrointestinal: Negative for abdominal pain, constipation, diarrhea, melena, nausea and vomiting.  Genitourinary: Negative for dysuria, frequency and urgency.       Retention  present  Musculoskeletal: Positive for myalgias. Negative for back pain and neck pain.  Skin: Negative for rash.  Neurological: Positive for weakness. Negative for dizziness, tingling, sensory change, speech change, focal weakness and headaches.  Endo/Heme/Allergies: Does not bruise/bleed easily.  Psychiatric/Behavioral: Negative for depression.    MEDICATIONS AT HOME:   Prior to Admission medications   Medication Sig Start Date End Date Taking? Authorizing Provider  acidophilus (RISAQUAD) CAPS capsule Take 1 capsule by mouth every evening.    Yes [provider]  Ascorbic Acid (VITAMIN C) 1000 MG tablet Take 500 mg by mouth 2 (two) times daily.    Yes [provider]  aspirin EC 81 MG tablet Take 81 mg by mouth daily.    Yes [provider]  Cholecalciferol 5000 units TABS Take 5,000 Units by mouth daily.   Yes [provider]  Coenzyme Q10 (CO Q-10) 120 MG CAPS Take 120 mg by mouth every evening.   Yes [provider]  digoxin (LANOXIN) 0.125 MG tablet Take 0.125 mg by mouth daily.   Yes [provider]  furosemide (LASIX) 20 MG tablet Take 20 mg by mouth every other day as needed for fluid.    Yes [provider]  gabapentin (NEURONTIN) 600 MG tablet Take 600 mg by mouth 2 (two) times daily.   Yes [provider]  guaiFENesin-codeine (ROBITUSSIN AC) 100-10 MG/5ML syrup Take 5 mLs by mouth 3 (three) times daily as needed for cough.   Yes [provider]  hydrocortisone (CORTEF) 10 MG tablet Take 10 mg by mouth 2 (two) times daily.    Yes [provider]  levothyroxine (SYNTHROID, LEVOTHROID) 100 MCG tablet Take 100 mcg by mouth daily before breakfast.   Yes [provider]  magnesium oxide (MAG-OX) 400 (241.3 Mg) MG tablet Take 400 mg by mouth every evening.   Yes [provider]  methenamine (MANDELAMINE) 1 g tablet Take 1,000 mg by mouth 2 (two) times daily with a meal.   Yes  [provider]  midodrine (PROAMATINE) 10 MG tablet Take 1 tablet (10 mg total) by mouth 3 (three) times daily with meals. Patient taking differently: Take 10 mg by mouth 2 (two) times daily with a meal.  08/30/16  Yes Sudini, Alveta Heimlich, MD  Multiple Vitamin (MULTIVITAMIN WITH MINERALS) TABS tablet Take 1 tablet by mouth daily.   Yes [provider]  ondansetron (ZOFRAN ODT) 4 MG disintegrating tablet Take 1 tablet (4 mg total) by mouth every 8 (eight) hours as needed for nausea or vomiting. 04/29/17  Yes Alfred Levins, [place], MD  pantoprazole (PROTONIX) 40 MG tablet Take 40 mg by mouth daily before breakfast.    Yes [provider]  potassium chloride (K-DUR) 10 MEQ tablet Take 10 mEq by mouth daily as needed (for fluid retention when taking furosemide).   Yes [provider]  pyridostigmine (MESTINON) 60 MG tablet Take 60 mg by mouth 2 (two) times daily. 03/11/17  Yes [provider]  ranitidine (ZANTAC) 300 MG tablet Take 300  mg by mouth every evening.   Yes [provider]  saw palmetto 500 MG capsule Take 1,000 mg by mouth every evening.   Yes [provider]  simvastatin (ZOCOR) 20 MG tablet Take 20 mg by mouth every evening.    Yes [provider]  vitamin B-12 (CYANOCOBALAMIN) 500 MCG tablet Take 500 mcg by mouth daily.    Yes [provider]  vitamin E 400 UNIT capsule Take 400 Units by mouth every evening.    Yes [provider]      VITAL SIGNS:  Blood pressure (!) 112/52, pulse 100, temperature 98.1 F (36.7 C), temperature source Oral, resp. rate (!) 0, height 5\' 10"  (1.778 m), weight 77.1 kg (170 lb), SpO2 98 %.  PHYSICAL EXAMINATION:   Physical Exam  GENERAL:  83 y.o.-year-old elderly patient lying in the bed with no acute distress.  EYES: Pupils equal, round, reactive to light and accommodation. No scleral icterus. Extraocular muscles intact.  HEENT: Head atraumatic, normocephalic. Oropharynx  and nasopharynx clear.  NECK:  Supple, no jugular venous distention. No thyroid enlargement, no tenderness.  LUNGS: scant breath sounds bilaterally, minimal scattered wheezing, no rales,rhonchi or crepitation. No use of accessory muscles of respiration.  CARDIOVASCULAR: S1, S2 normal. No rubs, or gallops. 3/6 systolic murmur present. AICD in place on left chest ABDOMEN: Soft, nontender, nondistended. Bowel sounds present. No organomegaly or mass.  EXTREMITIES: No pedal edema, cyanosis, or clubbing.  NEUROLOGIC: Cranial nerves II through XII are intact. Muscle strength 5/5 in all extremities. Sensation intact. Gait not checked. Global weakness noted PSYCHIATRIC: The patient is alert and oriented x 3.  SKIN: No obvious rash, lesion, or ulcer.   LABORATORY PANEL:   CBC Recent Labs  Lab 05/22/17 1003  WBC 7.8  HGB 14.3  HCT 42.7  PLT 183   ------------------------------------------------------------------------------------------------------------------  Chemistries  Recent Labs  Lab 05/22/17 1003  NA 135  K 4.6  CL 100*  CO2 28  GLUCOSE 120*  BUN 18  CREATININE 0.88  CALCIUM 9.0  AST 30  ALT 19  ALKPHOS 77  BILITOT 0.6   ------------------------------------------------------------------------------------------------------------------  Cardiac Enzymes No results for input(s): TROPONINI in the last 168 hours. ------------------------------------------------------------------------------------------------------------------  RADIOLOGY:  Ct Head Wo Contrast  Result Date: 05/22/2017 CLINICAL DATA:  Increasing weakness. Altered level of consciousness. EXAM: CT HEAD WITHOUT CONTRAST TECHNIQUE: Contiguous axial images were obtained from the base of the skull through the vertex without intravenous contrast. COMPARISON:  02/25/2017 FINDINGS: Brain: There is atrophy and chronic small vessel disease changes. No acute intracranial abnormality. Specifically, no hemorrhage,  hydrocephalus, mass lesion, acute infarction, or significant intracranial injury. Vascular: No hyperdense vessel or unexpected calcification. Skull: No acute calvarial abnormality. Sinuses/Orbits: Mucosal thickening throughout the paranasal sinuses. Mastoid air cells are clear. Orbital soft tissues unremarkable. Other: None IMPRESSION: No acute intracranial abnormality. Atrophy, chronic microvascular disease. Electronically Signed   By: Rolm Baptise M.D.   On: 05/22/2017 11:07   Dg Chest Port 1 View  Result Date: 05/22/2017 CLINICAL DATA:  Shortness of breath. EXAM: PORTABLE CHEST 1 VIEW COMPARISON:  04/29/2017. FINDINGS: AICD in stable position. Stable cardiomegaly. Stable bilateral interstitial prominence consistent chronic interstitial lung disease. Stable right pleural thickening consistent scarring. Prior cardiac valve replacement. No acute bony abnormality. IMPRESSION: 1. Stable changes of chronic interstitial lung disease and right-sided pleural scarring. 2. AICD in stable position. Prior cardiac valve replacement. Stable cardiomegaly. Electronically Signed   By: Marcello Moores  Register   On: 05/22/2017 10:23  EKG:   Orders placed or performed during the hospital encounter of 05/22/17  . ED EKG  . ED EKG  . ED EKG 12-Lead  . ED EKG 12-Lead  . EKG 12-Lead  . EKG 12-Lead    IMPRESSION AND PLAN:   Russell Howard  is a 83 y.o. male with a known history of cardiomyopathy with EF of 45%, COPD on 2 L home oxygen, remote history of smoking, paroxysmal A. fib, hyperlipidemia, valvular heart disease, benign prostatic hypertrophy requiring self-catheterization twice a day presents to hospital secondary to worsening weakness and sleepiness.  1.  Acute cystitis-gentle hydration, urine cultures -Started on Levaquin as he is allergic to penicillins  2.  Paroxysmal atrial fibrillation-continue digoxin Rate well controlled.  Patient is status post AICD and pacemaker -Follows with outpatient cardiologist.   On aspirin for anticoagulation.  3.  GERD-Protonix  4.  BPH-with urinary retention, status post self catheterizations.  Will continue  5.  Chronic hypotension-continue hydrocortisone and midodrine  6.  DVT prophylaxis-Lovenox  Physical therapy consulted for weakness    All the records are reviewed and case discussed with ED provider. Management plans discussed with the patient, family and they are in agreement.  CODE STATUS: Full Code  TOTAL TIME TAKING CARE OF THIS PATIENT: 50 minutes.    Gladstone Lighter M.D on 05/22/2017 at 2:56 PM  Between 7am to 6pm - Pager - [phone]  After 6pm go to [email or web address] - password [email or web address] [facility]  Office  [phone]  CC: Primary care physician; [name], MD

## 2017-05-22 NOTE — ED Notes (Signed)
Pt 89% on room air; placed on 2L nasal cannula to maintain saturation >92%.  EDP aware.

## 2017-05-22 NOTE — ED Provider Notes (Signed)
[facility] Emergency Department Provider Note    First MD Initiated Contact with Patient 05/22/17 1338     (approximate)  I have reviewed the triage vital signs and the nursing notes.  level V caveat: Altered mental status unable to obtain history from patient HISTORY  Chief Complaint Weakness    HPI Russell Howard is a 83 y.o. male with the below list of current medical conditions resents emergency department with generalized weaknessper the patient's daughter-in-law who is his caregiver. She states that the patient was recently treated for urinary tract infection and she is noted progressive generalized weakness.patient presents to the emergency department tachycardic hypotensive tachypnea can hypoxic however afebrile   Past Medical History:  Diagnosis Date  . Acquired hypothyroidism 11/02/2014  . BPH (benign prostatic hyperplasia)   . Cardiac defibrillator in place 11/02/2014  . Cardiomyopathy ([place])   . CHF (congestive heart failure) ([place])   . Chronic diastolic heart failure ([place]) 11/02/2014  . COPD (chronic obstructive pulmonary disease) ([place])   . Gastro-esophageal reflux disease without esophagitis 11/02/2014  . H/O malignant neoplasm 11/09/2014  . Heart valve disease 11/02/2014  . History of atrial fibrillation 11/02/2014  . Lung cancer ([place])   . Neuropathy 11/02/2014  . Orthostasis 11/02/2014  . Pure hypercholesterolemia 11/02/2014  . Valvular heart disease     Patient Active Problem List   Diagnosis Date Noted  . UTI (urinary tract infection) 11/17/2016  . Empyema ([place])   . Weakness   . Community acquired pneumonia   . Pleural effusion   . Palliative care by specialist   . Acute on chronic respiratory failure with hypoxia ([place]) 08/19/2016  . Acute respiratory failure with hypoxia ([place]) 08/19/2016  . Septic shock ([place]) 03/02/2016  . Hydronephrosis, right 01/14/2016  . Constipation 01/13/2016  . Urinary retention 01/13/2016  . Lower abdominal  pain 01/13/2016  . Bacteremia 01/13/2016  . Hypotension 01/13/2016  . Palliative care encounter   . Goals of care, counseling/discussion   . Cough   . Encounter for hospice care discussion   . Hypoxia 01/09/2016  . Primary cancer of bronchus of right lower lobe ([place]) 11/29/2015  . Sepsis ([place]) 06/14/2015  . Acute lower UTI 06/14/2015    Class: Acute  . H/O malignant neoplasm 11/09/2014  . Acquired hypothyroidism 11/02/2014  . Cardiomyopathy ([place]) 11/02/2014  . Acute on chronic diastolic CHF (congestive heart failure) ([place]) 11/02/2014  . Diabetes mellitus without complication ([place]) 65/68/1275  . Cardiac defibrillator in place 11/02/2014  . Gastro-esophageal reflux disease without esophagitis 11/02/2014  . History of atrial fibrillation 11/02/2014  . BP (high blood pressure) 11/02/2014  . Neuropathy 11/02/2014  . Orthostasis 11/02/2014  . Pure hypercholesterolemia 11/02/2014  . Heart valve disease 11/02/2014    Past Surgical History:  Procedure Laterality Date  . APPENDECTOMY    . DUAL ICD IMPLANT  2007/2009   implantable cardiac defibrillator  . PORT-A-CATH REMOVAL      Prior to Admission medications   Medication Sig Start Date End Date Taking? Authorizing Provider  acidophilus (RISAQUAD) CAPS capsule Take 1 capsule by mouth every evening.    Yes [provider]  Ascorbic Acid (VITAMIN C) 1000 MG tablet Take 500 mg by mouth 2 (two) times daily.    Yes [provider]  aspirin EC 81 MG tablet Take 81 mg by mouth daily.    Yes [provider]  Cholecalciferol 5000 units TABS Take 5,000 Units by mouth daily.   Yes [provider]  Coenzyme Q10 (CO Q-10) 120 MG CAPS Take 120 mg by mouth every evening.   Yes [provider]  digoxin (LANOXIN) 0.125 MG tablet Take 0.125 mg by mouth daily.   Yes [provider]  furosemide (LASIX) 20 MG tablet Take 20 mg by mouth every other day as needed for fluid.    Yes [provider]  gabapentin (NEURONTIN) 600 MG tablet Take 600 mg by mouth 2 (two) times daily.   Yes [provider]  guaiFENesin-codeine (ROBITUSSIN AC) 100-10 MG/5ML syrup Take 5 mLs by mouth 3 (three) times daily as needed for cough.   Yes [provider]  hydrocortisone (CORTEF) 10 MG tablet Take 10 mg by mouth 2 (two) times daily.    Yes [provider]  levothyroxine (SYNTHROID, LEVOTHROID) 100 MCG tablet Take 100 mcg by mouth daily before breakfast.   Yes [provider]  magnesium oxide (MAG-OX) 400 (241.3 Mg) MG tablet Take 400 mg by mouth every evening.   Yes [provider]  methenamine (MANDELAMINE) 1 g tablet Take 1,000 mg by mouth 2 (two) times daily with a meal.   Yes [provider]  midodrine (PROAMATINE) 10 MG tablet Take 1 tablet (10 mg total) by mouth 3 (three) times daily with meals. Patient taking differently: Take 10 mg by mouth 2 (two) times daily with a meal.  08/30/16  Yes Sudini, Alveta Heimlich, MD  Multiple Vitamin (MULTIVITAMIN WITH MINERALS) TABS tablet Take 1 tablet by mouth daily.   Yes [provider]  ondansetron (ZOFRAN ODT) 4 MG disintegrating tablet Take 1 tablet (4 mg total) by mouth every 8 (eight) hours as needed for nausea or vomiting. 04/29/17  Yes Alfred Levins, [place], MD  pantoprazole (PROTONIX) 40 MG tablet Take 40 mg by mouth daily before breakfast.    Yes [provider]  potassium chloride (K-DUR) 10 MEQ tablet Take 10 mEq by mouth daily as needed (for fluid retention when taking furosemide).   Yes [provider]  pyridostigmine (MESTINON) 60 MG tablet Take 60 mg by mouth 2 (two) times daily. 03/11/17  Yes [provider]  ranitidine (ZANTAC) 300 MG tablet Take 300 mg by mouth every evening.   Yes [provider]  saw palmetto 500 MG capsule Take 1,000 mg by mouth every evening.   Yes [provider]  simvastatin (ZOCOR) 20 MG tablet Take 20 mg by mouth every evening.     Yes [provider]  vitamin B-12 (CYANOCOBALAMIN) 500 MCG tablet Take 500 mcg by mouth daily.    Yes [provider]  vitamin E 400 UNIT capsule Take 400 Units by mouth every evening.    Yes [provider]    Allergies Penicillin g and Sulfa antibiotics  Family History  Problem Relation Age of Onset  . Hypertension Unknown   . Heart disease Unknown     Social History Social History   Tobacco Use  . Smoking status: Former Smoker    Types: Cigarettes    Last attempt to quit: 11/21/1981    Years since quitting: 35.5  . Smokeless tobacco: Never Used  Substance Use Topics  . Alcohol use: No    Alcohol/week: 0.0 oz  . Drug use: No    Review of Systems Constitutional: No fever/chills Eyes: No visual changes. ENT: No sore throat. Cardiovascular: Denies chest pain. Respiratory: Denies shortness of breath. Gastrointestinal: No abdominal pain.  No nausea, no vomiting.  No diarrhea.  No constipation. Genitourinary: Negative for dysuria. Musculoskeletal:  Negative for neck pain.  Negative for back pain. Integumentary: Negative for rash. Neurological: Negative for headaches, focal weakness or numbness.   ____________________________________________   PHYSICAL EXAM:  VITAL SIGNS: ED Triage Vitals  Enc Vitals Group     BP 05/22/17 0941 (!) 92/45     Pulse Rate 05/22/17 0941 (!) 114     Resp 05/22/17 0941 (!) 26     Temp 05/22/17 0941 98.1 F (36.7 C)     Temp Source 05/22/17 0941 Oral     SpO2 05/22/17 0941 90 %     Weight 05/22/17 0942 77.1 kg (170 lb)     Height 05/22/17 0942 1.778 m (5\' 10" )     Head Circumference --      Peak Flow --      Pain Score 05/22/17 0942 0     Pain Loc --      Pain Edu? --      Excl. in [place]? --     Constitutional: Alert and ill appearing Eyes: Conjunctivae are normal.  Head: Atraumatic. Mouth/Throat: Mucous membranes are moist.  Oropharynx non-erythematous. Neck: No stridor.   Cardiovascular: Normal rate,  regular rhythm. Good peripheral circulation. Grossly normal heart sounds. Respiratory: Normal respiratory effort.  No retractions. diffuse right lung rhonchi Gastrointestinal: Soft and nontender. No distention.  Musculoskeletal: No lower extremity tenderness nor edema. No gross deformities of extremities. Neurologic:  Normal speech and language. No gross focal neurologic deficits are appreciated.  Skin:  Skin is warm, dry and intact. No rash noted. Psychiatric: Mood and affect are normal. Speech and behavior are normal.  ____________________________________________   LABS (all labs ordered are listed, but only abnormal results are displayed)  Labs Reviewed  CBC - Abnormal; Notable for the following components:      Result Value   RDW 15.4 (*)    All other components within normal limits  COMPREHENSIVE METABOLIC PANEL - Abnormal; Notable for the following components:   Chloride 100 (*)    Glucose, Bld 120 (*)    All other components within normal limits  URINALYSIS, COMPLETE (UACMP) WITH MICROSCOPIC - Abnormal; Notable for the following components:   Color, Urine YELLOW (*)    APPearance CLEAR (*)    Leukocytes, UA SMALL (*)    Squamous Epithelial / LPF 0-5 (*)    All other components within normal limits  CULTURE, BLOOD (ROUTINE X 2)  CULTURE, BLOOD (ROUTINE X 2)  LACTIC ACID, PLASMA   ____________________________________________  EKG  ED ECG REPORT I, [place] N Marko Skalski, the attending physician, personally viewed and interpreted this ECG.   Date: 05/22/2017  EKG Time: 9:38 AM  Rate: 113  Rhythm:atrial sensed ventricular paced rhythm tachycardia  Axis: normal  Intervals:normal  ST&T Change: none  ____________________________________________  RADIOLOGY I, [place] Ernst Bowler, personally viewed and evaluated these images (plain radiographs) as part of my medical decision making, as well as reviewing the written report by the radiologist.  ED MD interpretation:   No acute  intracranial findings per radiologist  Official radiology report(s): Ct Head Wo Contrast  Result Date: 05/22/2017 CLINICAL DATA:  Increasing weakness. Altered level of consciousness. EXAM: CT HEAD WITHOUT CONTRAST TECHNIQUE: Contiguous axial images were obtained from the base of the skull through the vertex without intravenous contrast. COMPARISON:  02/25/2017 FINDINGS: Brain: There is atrophy and chronic small vessel disease changes. No acute intracranial abnormality. Specifically, no hemorrhage, hydrocephalus, mass lesion, acute infarction, or significant intracranial injury. Vascular: No hyperdense vessel or unexpected calcification. Skull: No  acute calvarial abnormality. Sinuses/Orbits: Mucosal thickening throughout the paranasal sinuses. Mastoid air cells are clear. Orbital soft tissues unremarkable. Other: None IMPRESSION: No acute intracranial abnormality. Atrophy, chronic microvascular disease. Electronically Signed   By: Rolm Baptise M.D.   On: 05/22/2017 11:07   Dg Chest Port 1 View  Result Date: 05/22/2017 CLINICAL DATA:  Shortness of breath. EXAM: PORTABLE CHEST 1 VIEW COMPARISON:  04/29/2017. FINDINGS: AICD in stable position. Stable cardiomegaly. Stable bilateral interstitial prominence consistent chronic interstitial lung disease. Stable right pleural thickening consistent scarring. Prior cardiac valve replacement. No acute bony abnormality. IMPRESSION: 1. Stable changes of chronic interstitial lung disease and right-sided pleural scarring. 2. AICD in stable position. Prior cardiac valve replacement. Stable cardiomegaly. Electronically Signed   By: Marcello Moores  Register   On: 05/22/2017 10:23     .Critical Care Performed by: Gregor Hams, MD Authorized by: Gregor Hams, MD   Critical care provider statement:    Critical care time (minutes):  30   Critical care time was exclusive of:  Separately billable procedures and treating other patients and teaching time   Critical care  was necessary to treat or prevent imminent or life-threatening deterioration of the following conditions:  Sepsis   Critical care was time spent personally by me on the following activities:  Development of treatment plan with patient or surrogate, discussions with consultants, evaluation of patient's response to treatment, examination of patient, obtaining history from patient or surrogate, ordering and performing treatments and interventions, ordering and review of laboratory studies, ordering and review of radiographic studies, pulse oximetry, re-evaluation of patient's condition and review of old charts   I assumed direction of critical care for this patient from another provider in my specialty: no       ____________________________________________   INITIAL IMPRESSION / ASSESSMENT AND PLAN / ED COURSE  As part of my medical decision making, I reviewed the following data within the electronic MEDICAL RECORD NUMBER _67 year old male presenting with above stated history of physical exam with concern for possible sepsis given tachycardia hypotension tachypnea hypoxia as such sepsis protocol was initiated. No clear source identified for the patient while in the emergency department as chest x-ray and urinalysis revealed no acute evidence of infection. Patient discussed with the hospitalist for admission for further evaluation and management for possible sepsis as we await blood cultures._____________________________________  FINAL CLINICAL IMPRESSION(S) / ED DIAGNOSES  Final diagnoses:  Weakness  Sepsis, due to unspecified organism ([place])     MEDICATIONS GIVEN DURING THIS VISIT:  Medications  aztreonam (AZACTAM) 2 g in sodium chloride 0.9 % 100 mL IVPB (has no administration in time range)  levofloxacin (LEVAQUIN) IVPB 750 mg (has no administration in time range)  vancomycin (VANCOCIN) IVPB 1000 mg/200 mL premix (has no administration in time range)  levofloxacin (LEVAQUIN) IVPB 750 mg (0 mg  Intravenous Stopped 05/22/17 1317)  aztreonam (AZACTAM) 2 g in sodium chloride 0.9 % 100 mL IVPB (0 g Intravenous Stopped 05/22/17 1135)  vancomycin (VANCOCIN) IVPB 1000 mg/200 mL premix (0 mg Intravenous Stopped 05/22/17 1317)  sodium chloride 0.9 % bolus 1,000 mL (0 mLs Intravenous Stopped 05/22/17 1135)  ondansetron (ZOFRAN) injection 4 mg (4 mg Intravenous Given 05/22/17 1319)     ED Discharge Orders    None       Note:  This document was prepared using Dragon voice recognition software and may include unintentional dictation errors.    Gregor Hams, MD 05/22/17 [id number]

## 2017-05-22 NOTE — Progress Notes (Signed)
CODE SEPSIS - PHARMACY COMMUNICATION  **Broad Spectrum Antibiotics should be administered within 1 hour of Sepsis diagnosis**  Patient has tolerated ceftriaxone on previous admission.   Time Code Sepsis Called/Page Received: 4462  Antibiotics Ordered: Aztreonam, Levofloxacin, Vancomycin   Time of 1st antibiotic administration: 1023 - levofloxacin   Additional action taken by pharmacy: none indicated   If necessary, Name of Provider/Nurse Contacted: N/A      Simpson,Michael L ,PharmD Clinical Pharmacist  05/22/2017  10:50 AM

## 2017-05-22 NOTE — ED Notes (Signed)
Pt's daughter in law assisted patient to self catheterize.

## 2017-05-22 NOTE — Progress Notes (Addendum)
Pharmacy Antibiotic Note  Russell Howard is a 83 y.o. male admitted on 05/22/2017 with sepsis.  Pharmacy has been consulted for levofloxacin,  Plan: 1. Levofloxacin 750 mg IV Q24H  05/22/17 15:06 admitting MD stops aztreonam and vancomycin - wants to continue only levofloxacin for UTI.   Height: 5\' 10"  (177.8 cm) Weight: 170 lb (77.1 kg) IBW/kg (Calculated) : 73  Temp (24hrs), Avg:98.1 F (36.7 C), Min:98.1 F (36.7 C), Max:98.1 F (36.7 C)  Recent Labs  Lab 05/22/17 1003  WBC 7.8  CREATININE 0.88  LATICACIDVEN 1.4    Estimated Creatinine Clearance: 69.1 mL/min (by C-G formula based on SCr of 0.88 mg/dL).    Allergies  Allergen Reactions  . Penicillin G Hives    Has patient had a PCN reaction causing immediate rash, facial/tongue/throat swelling, SOB or lightheadedness with hypotension: Yes Has patient had a PCN reaction causing severe rash involving mucus membranes or skin necrosis: No Has patient had a PCN reaction that required hospitalization No Has patient had a PCN reaction occurring within the last 10 years: No If all of the above answers are "NO", then may proceed with Cephalosporin use.  . Sulfa Antibiotics Rash    Antimicrobials this admission: Levofloxacin, aztreonam, vancomycin x 1 in ED  Dose adjustments this admission:   Microbiology results: 4/19 BCx: pending  UCx:    Sputum:    MRSA PCR:   Thank you for allowing pharmacy to be a part of this patient's care.  Laural Benes, Pharm.D., BCPS Clinical Pharmacist 05/22/2017 1:38 PM

## 2017-05-22 NOTE — ED Notes (Signed)
Floor unable to take report at this time.

## 2017-05-22 NOTE — Progress Notes (Signed)
   [name] [facility] Day: 0 days Russell Howard is a 83 y.o. male presenting with Weakness .   Advance care planning discussed with patient and his daughter-in-law at bedside. All questions in regards to overall condition and expected prognosis answered. The decision was made to change current code status. Patient expressed his wishes to be DNR and says he has a paper signed at home.  CODE STATUS: DNR  Time spent: 18 minutes

## 2017-05-22 NOTE — ED Notes (Signed)
Code sepsis called  [phone]

## 2017-05-22 NOTE — Progress Notes (Signed)

## 2017-05-23 ENCOUNTER — Inpatient Hospital Stay: Payer: Medicare Other

## 2017-05-23 LAB — CBC
HEMATOCRIT: 39.8 % — AB (ref 40.0–52.0)
Hemoglobin: 13.5 g/dL (ref 13.0–18.0)
MCH: 30.9 pg (ref 26.0–34.0)
MCHC: 33.9 g/dL (ref 32.0–36.0)
MCV: 91 fL (ref 80.0–100.0)
Platelets: 165 10*3/uL (ref 150–440)
RBC: 4.37 MIL/uL — AB (ref 4.40–5.90)
RDW: 15.2 % — ABNORMAL HIGH (ref 11.5–14.5)
WBC: 7.6 10*3/uL (ref 3.8–10.6)

## 2017-05-23 LAB — BASIC METABOLIC PANEL
Anion gap: 7 (ref 5–15)
BUN: 16 mg/dL (ref 6–20)
CHLORIDE: 100 mmol/L — AB (ref 101–111)
CO2: 28 mmol/L (ref 22–32)
Calcium: 9.1 mg/dL (ref 8.9–10.3)
Creatinine, Ser: 0.85 mg/dL (ref 0.61–1.24)
GFR calc non Af Amer: >60 mL/min (ref >60)
Glucose, Bld: 137 mg/dL — ABNORMAL HIGH (ref 65–99)
POTASSIUM: 4.5 mmol/L (ref 3.5–5.1)
SODIUM: 135 mmol/L (ref 135–145)

## 2017-05-23 LAB — PROCALCITONIN: Procalcitonin: <0.1 ng/mL

## 2017-05-23 LAB — BRAIN NATRIURETIC PEPTIDE: B Natriuretic Peptide: 84 pg/mL (ref 0.0–100.0)

## 2017-05-23 MED ORDER — LEVOTHYROXINE SODIUM 100 MCG PO TABS
100.0000 ug | ORAL_TABLET | Freq: Every day | ORAL | Status: DC
  Administered 2017-05-23 – 2017-05-29 (×7): 100 ug via ORAL
  Filled 2017-05-23 (×7): qty 1

## 2017-05-23 MED ORDER — GUAIFENESIN-CODEINE 100-10 MG/5ML PO SOLN
10.0000 mL | ORAL | Status: DC | PRN
  Administered 2017-05-23 – 2017-05-24 (×4): 10 mL via ORAL
  Filled 2017-05-23 (×5): qty 10

## 2017-05-23 MED ORDER — IPRATROPIUM-ALBUTEROL 0.5-2.5 (3) MG/3ML IN SOLN
3.0000 mL | Freq: Four times a day (QID) | RESPIRATORY_TRACT | Status: DC
  Administered 2017-05-23 – 2017-05-26 (×3): 3 mL via RESPIRATORY_TRACT
  Filled 2017-05-23 (×7): qty 3

## 2017-05-23 MED ORDER — ENOXAPARIN SODIUM 80 MG/0.8ML ~~LOC~~ SOLN: 1.0000 mg/kg | Freq: Two times a day (BID) | SUBCUTANEOUS | Status: DC

## 2017-05-23 MED ORDER — FLUTICASONE PROPIONATE 50 MCG/ACT NA SUSP
2.0000 | Freq: Every day | NASAL | Status: DC
  Administered 2017-05-23 – 2017-05-29 (×5): 2 via NASAL
  Filled 2017-05-23: qty 16

## 2017-05-23 MED ORDER — GUAIFENESIN ER 600 MG PO TB12
600.0000 mg | ORAL_TABLET | Freq: Two times a day (BID) | ORAL | Status: DC
  Administered 2017-05-23 – 2017-05-29 (×12): 600 mg via ORAL
  Filled 2017-05-23 (×11): qty 1

## 2017-05-23 MED ORDER — IPRATROPIUM-ALBUTEROL 0.5-2.5 (3) MG/3ML IN SOLN: 3.0000 mL | Freq: Four times a day (QID) | RESPIRATORY_TRACT | Status: DC | PRN

## 2017-05-23 MED ORDER — IOPAMIDOL (ISOVUE-300) INJECTION 61%
75.0000 mL | Freq: Once | INTRAVENOUS | Status: AC | PRN
  Administered 2017-05-23: 75 mL via INTRAVENOUS

## 2017-05-23 MED ORDER — ENOXAPARIN SODIUM 80 MG/0.8ML ~~LOC~~ SOLN
1.0000 mg/kg | Freq: Two times a day (BID) | SUBCUTANEOUS | Status: DC
  Administered 2017-05-23 – 2017-05-24 (×2): 80 mg via SUBCUTANEOUS
  Filled 2017-05-23 (×2): qty 0.8

## 2017-05-23 MED ORDER — MIDODRINE HCL 5 MG PO TABS
10.0000 mg | ORAL_TABLET | Freq: Three times a day (TID) | ORAL | Status: DC
  Administered 2017-05-23 – 2017-05-29 (×11): 10 mg via ORAL
  Filled 2017-05-23 (×14): qty 2

## 2017-05-23 NOTE — Evaluation (Signed)
Occupational Therapy Evaluation Patient Details Name: Russell Howard MRN: [id number] DOB: 1934-04-10 Today's Date: 05/23/2017    History of Present Illness 83 y.o.malept with a known history of cardiomyopathy with EF of 45%, COPD on 2 L home oxygen, remote history of smoking, paroxysmal A. fib, hyperlipidemia, valvular heart disease, benign prostatic hypertrophy requiring self-catheterization twice a day presents to hospital secondary to worsening weakness and sleepiness over the last week and a cough.    Clinical Impression   Pt seen for OT evaluation this date. Prior to approx 1 week ago, daughter in law and pt report pt was ambulating limited household distances with a rollator and supervision/SBA from family and able to perform dressing tasks with minimal assist from daughter in law. DIL assists with med mgt, household tasks, meal prep, and set up for self-catheterization which pt performs x2/day. Pt on 2L O2 at home. No falls history. Pt enjoys singing, going for car trips or out to eat, and attends church with his family. Pt expresses desire to get back to singing "but my lungs won't let me." Per family/pt report, pt had started HHPT and [place] services recently.   Currently, pt requires SBA for bed mobility and able to tolerate <4 minutes sitting EOB with kyphotic posture 2:2 fatigue before he requires to return to supine. O2 sats 90-94% on 2L O2, improving once lying back down with HOB elevated slightly (~30 degrees). HR ~110 with mobility and down to 102 once back in bed. Pt demonstrates significant impairment in activity tolerance 2:2 cardiopulmonary status, unable to achieve full bilat knee extension, and requiring increased assist with mobility and ADL tasks. Pt/family instructed in energy conservation principles (work simplification, conserving energy for meaningful occupations/activities). Pt and family note previously working with [facility] and discontinued it after they perceived maximal benefit  was achieved and express not being interested in additional [facility] services after this hospitalization. Pt would benefit from skilled OT services while in the hospital to maximize return to PLOF, trial use of energy conservation strategies and DME/AE to support improved participation in ADL and meaningful occupations in order to minimize falls risk and caregiver burden while maximizing independence and quality of life.      Follow Up Recommendations  Supervision/Assistance - 24 hour(pt/family declining STR and HHOT services)    Equipment Recommendations  None recommended by OT    Recommendations for Other Services       Precautions / Restrictions Precautions Precautions: Fall;ICD/Pacemaker Precaution Comments: Oxygen 2L  Restrictions Weight Bearing Restrictions: No      Mobility Bed Mobility Overal bed mobility: Needs Assistance Bed Mobility: Supine to Sit;Sit to Supine     Supine to sit: Min guard;HOB elevated Sit to supine: Min guard   General bed mobility comments: additional time, significant effort to perform  Transfers                 General transfer comment: deferred, due to pt's fatigue    Balance Overall balance assessment: Needs assistance Sitting-balance support: Bilateral upper extremity supported;Feet supported Sitting balance-Leahy Scale: Fair                                     ADL either performed or assessed with clinical judgement   ADL Overall ADL's : Needs assistance/impaired Eating/Feeding: Bed level;Set up   Grooming: Bed level;Set up   Upper Body Bathing: Bed level;Maximal assistance   Lower Body Bathing: Bed level;Maximal  assistance   Upper Body Dressing : Sitting;Moderate assistance   Lower Body Dressing: Sitting/lateral leans;Maximal assistance   Toilet Transfer: RW;BSC;Minimal assistance;Stand-pivot                   Vision Baseline Vision/History: Wears glasses Wears Glasses: Reading only Patient  Visual Report: No change from baseline       Perception     Praxis      Pertinent Vitals/Pain Pain Assessment: Faces Faces Pain Scale: Hurts a little bit Pain Location: "upset stomach" Pain Descriptors / Indicators: Guarding;Discomfort Pain Intervention(s): Limited activity within patient's tolerance;Monitored during session;Repositioned     Hand Dominance Right   Extremity/Trunk Assessment Upper Extremity Assessment Upper Extremity Assessment: Overall WFL for tasks assessed   Lower Extremity Assessment Lower Extremity Assessment: Defer to PT evaluation;Generalized weakness(at least 4/5 bilaterally, decreased knee extension and dorsiflexion bilaterally)   Cervical / Trunk Assessment Cervical / Trunk Assessment: Kyphotic   Communication Communication Communication: No difficulties   Cognition Arousal/Alertness: Awake/alert Behavior During Therapy: WFL for tasks assessed/performed Overall Cognitive Status: Within Functional Limits for tasks assessed                                     General Comments       Exercises Other Exercises Other Exercises: Pt/family instructed in principles of energy conservation including work simplification, activity pacing, and engaging in meaningful activities for quality of life   Shoulder Instructions      Home Living Family/patient expects to be discharged to:: Private residence Living Arrangements: Children;Other relatives Available Help at Discharge: Family;Available 24 hours/day Type of Home: House Home Access: Ramped entrance     Home Layout: Two level;Able to live on main level with bedroom/bathroom     Bathroom Shower/Tub: Tub/shower unit;Walk-in shower   Bathroom Toilet: Handicapped height(regular with riser over top, no rails)     Home Equipment: Wheelchair - [employer] - 4 wheels;Shower seat;Toilet riser;Other (comment)(adjustable bed, pulse oximeter, gait belt)          Prior  Functioning/Environment Level of Independence: Needs assistance  Gait / Transfers Assistance Needed: Limited household ambulator with rollator with SBA from family for safety, no fall history ADL's / Homemaking Assistance Needed: Requires max assist for bathing and min-mod assist for dressing from daughter in law, daughter in law provides set up for self-cath (pt does 2x/day) and manages medications, meals, cleaning   Comments: w/c used for community access        OT Problem List: Decreased strength;Decreased knowledge of use of DME or AE;Decreased activity tolerance;Cardiopulmonary status limiting activity;Impaired balance (sitting and/or standing);Decreased range of motion      OT Treatment/Interventions: Self-care/ADL training;Therapeutic activities;Energy conservation;Patient/family education;DME and/or AE instruction    OT Goals(Current goals can be found in the care plan section) Acute Rehab OT Goals Patient Stated Goal: to return home at [facility] OT Goal Formulation: With patient/family Time For Goal Achievement: 06/06/17 Potential to Achieve Goals: Good ADL Goals Pt Will Transfer to Toilet: ambulating;bedside commode;with min guard assist(LRAD for amb, BSC within 5 ft., with O2 sats >93% on 2L O2) Additional ADL Goal #1: Pt/family will verbalize plan to implement at least 1 learned energy conservation strategy to support pt's participation in meaningful occupation to maximize quality of life.  OT Frequency: Min 1X/week   Barriers to D/C:            Co-evaluation  AM-PAC PT "6 Clicks" Daily Activity     Outcome Measure Help from another person eating meals?: A Little Help from another person taking care of personal grooming?: A Little Help from another person toileting, which includes using toliet, bedpan, or urinal?: A Little Help from another person bathing (including washing, rinsing, drying)?: A Lot Help from another person to put on and taking off regular  upper body clothing?: A Lot Help from another person to put on and taking off regular lower body clothing?: A Lot 6 Click Score: 15   End of Session Equipment Utilized During Treatment: Oxygen(2L)  Activity Tolerance: Patient limited by fatigue Patient left: in bed;with call bell/phone within reach;with nursing/sitter in room;with family/visitor present  OT Visit Diagnosis: Other abnormalities of gait and mobility (R26.89);Muscle weakness (generalized) (M62.81)                Time: 6184-8592 OT Time Calculation (min): 36 min Charges:  OT General Charges $OT Visit: 1 Visit OT Evaluation $OT Eval Moderate Complexity: 1 Mod OT Treatments $Self Care/Home Management : 8-22 mins  Jeni Salles, MPH, MS, OTR/L ascom [phone] 05/23/17, 11:39 AM

## 2017-05-23 NOTE — Progress Notes (Signed)
[place] at [facility]                                                                                                                                                                                  Patient Demographics   Russell Howard, is a 83 y.o. male, DOB - 02-Jun-1934, TGG:[id number]  Admit date - 05/22/2017   Admitting Physician Gladstone Lighter, MD  Outpatient Primary MD for the patient is [name], MD   LOS - 1  Subjective: Patient continues to complain of cough Also complains of weakness     Review of Systems:   CONSTITUTIONAL: No documented fever. No fatigue, weakness. No weight gain, no weight loss.  EYES: No blurry or double vision.  ENT: No tinnitus. No postnasal drip. No redness of the oropharynx.  RESPIRATORY: +cough, no wheeze, no hemoptysis. + dyspnea.  CARDIOVASCULAR: No chest pain. No orthopnea. No palpitations. No syncope.  GASTROINTESTINAL: No nausea, no vomiting or diarrhea. No abdominal pain. No melena or hematochezia.  GENITOURINARY: No dysuria or hematuria.  ENDOCRINE: No polyuria or nocturia. No heat or cold intolerance.  HEMATOLOGY: No anemia. No bruising. No bleeding.  INTEGUMENTARY: No rashes. No lesions.  MUSCULOSKELETAL: No arthritis. No swelling. No gout.  NEUROLOGIC: No numbness, tingling, or ataxia. No seizure-type activity.  PSYCHIATRIC: No anxiety. No insomnia. No ADD.    Vitals:   Vitals:   05/22/17 1612 05/22/17 1914 05/23/17 0405 05/23/17 0808  BP: (!) 131/91 121/63 (!) 106/56 99/62  Pulse: (!) 113 (!) 111 (!) 105 98  Resp: 18 18 18 18   Temp: 98.2 F (36.8 C) 98.7 F (37.1 C) 98.7 F (37.1 C) 97.8 F (36.6 C)  TempSrc:    Oral  SpO2: 95% 95% 94% 94%  Weight: 81.6 kg (179 lb 14.3 oz)     Height:        Wt Readings from Last 3 Encounters:  05/22/17 81.6 kg (179 lb 14.3 oz)  05/04/17 77.1 kg (170 lb)  04/29/17 77.1 kg (170 lb)     Intake/Output Summary (Last 24 hours) at 05/23/2017  1409 Last data filed at 05/23/2017 1355 Gross per 24 hour  Intake 600 ml  Output 1405 ml  Net -805 ml    Physical Exam:   GENERAL: Pleasant-appearing in no apparent distress.  HEAD, EYES, EARS, NOSE AND THROAT: Atraumatic, normocephalic. Extraocular muscles are intact. Pupils equal and reactive to light. Sclerae anicteric. No conjunctival injection. No oro-pharyngeal erythema.  NECK: Supple. There is no jugular venous distention. No bruits, no lymphadenopathy, no thyromegaly.  HEART: Regular rate and rhythm,. No murmurs, no rubs, no clicks.  LUNGS: Clear to auscultation bilaterally. No rales  or rhonchi. No wheezes.  ABDOMEN: Soft, flat, nontender, nondistended. Has good bowel sounds. No hepatosplenomegaly appreciated.  EXTREMITIES: No evidence of any cyanosis, clubbing, or peripheral edema.  +2 pedal and radial pulses bilaterally.  NEUROLOGIC: The patient is alert, awake, and oriented x3 with no focal motor or sensory deficits appreciated bilaterally.  SKIN: Moist and warm with no rashes appreciated.  Psych: Not anxious, depressed LN: No inguinal LN enlargement    Antibiotics   Anti-infectives (From admission, onward)   Start     Dose/Rate Route Frequency Ordered Stop   05/23/17 1000  levofloxacin (LEVAQUIN) IVPB 750 mg  Status:  Discontinued     750 mg 100 mL/hr over 90 Minutes Intravenous Every 24 hours 05/22/17 1338 05/22/17 1504   05/23/17 1000  levofloxacin (LEVAQUIN) IVPB 750 mg     750 mg 100 mL/hr over 90 Minutes Intravenous Every 24 hours 05/22/17 1504     05/22/17 1800  aztreonam (AZACTAM) 2 g in sodium chloride 0.9 % 100 mL IVPB  Status:  Discontinued     2 g 200 mL/hr over 30 Minutes Intravenous Every 8 hours 05/22/17 1338 05/22/17 1504   05/22/17 1800  vancomycin (VANCOCIN) IVPB 1000 mg/200 mL premix  Status:  Discontinued     1,000 mg 200 mL/hr over 60 Minutes Intravenous Every 12 hours 05/22/17 1338 05/22/17 1504   05/22/17 1000  levofloxacin (LEVAQUIN) IVPB 750  mg     750 mg 100 mL/hr over 90 Minutes Intravenous  Once 05/22/17 0949 05/22/17 1317   05/22/17 1000  aztreonam (AZACTAM) 2 g in sodium chloride 0.9 % 100 mL IVPB     2 g 200 mL/hr over 30 Minutes Intravenous  Once 05/22/17 0949 05/22/17 1135   05/22/17 1000  vancomycin (VANCOCIN) IVPB 1000 mg/200 mL premix     1,000 mg 200 mL/hr over 60 Minutes Intravenous  Once 05/22/17 0949 05/22/17 1317      Medications   Scheduled Meds: . acidophilus  1 capsule Oral QPM  . aspirin EC  81 mg Oral Daily  . cholecalciferol  5,000 Units Oral Daily  . digoxin  0.125 mg Oral Daily  . enoxaparin (LOVENOX) injection  40 mg Subcutaneous Q24H  . famotidine  20 mg Oral Daily  . fluticasone  2 spray Each Nare Daily  . gabapentin  600 mg Oral BID  . guaiFENesin  600 mg Oral BID  . hydrocortisone  10 mg Oral BID  . ipratropium-albuterol  3 mL Nebulization Q6H  . levothyroxine  100 mcg Oral QAC breakfast  . magnesium oxide  400 mg Oral QPM  . methenamine  1,000 mg Oral BID WC  . midodrine  10 mg Oral TID  . multivitamin with minerals  1 tablet Oral Daily  . pantoprazole  40 mg Oral QAC breakfast  . pyridostigmine  60 mg Oral BID  . simvastatin  20 mg Oral QPM  . vitamin B-12  500 mcg Oral Daily  . vitamin C  500 mg Oral BID  . vitamin E  400 Units Oral QPM   Continuous Infusions: . levofloxacin (LEVAQUIN) IV 750 mg (05/23/17 1113)   PRN Meds:.acetaminophen **OR** acetaminophen, guaiFENesin-codeine, ipratropium-albuterol, ondansetron **OR** ondansetron (ZOFRAN) IV   Data Review:   Micro Results Recent Results (from the past 240 hour(s))  Blood Culture (routine x 2)     Status: None (Preliminary result)   Collection Time: 05/22/17 10:03 AM  Result Value Ref Range Status   Specimen Description BLOOD RIGHT FA  Final  Special Requests   Final    BOTTLES DRAWN AEROBIC AND ANAEROBIC Blood Culture adequate volume   Culture   Final    NO GROWTH < 24 HOURS Performed at [facility],  [address] [place], [place] [address]    Report Status PENDING  Incomplete  Blood Culture (routine x 2)     Status: None (Preliminary result)   Collection Time: 05/22/17 10:04 AM  Result Value Ref Range Status   Specimen Description BLOOD RIGHT [facility]  Final   Special Requests   Final    BOTTLES DRAWN AEROBIC AND ANAEROBIC Blood Culture adequate volume   Culture   Final    NO GROWTH < 24 HOURS Performed at [facility], [address], [place], [place] [address]    Report Status PENDING  Incomplete    Radiology Reports Dg Chest 2 View  Result Date: 04/29/2017 CLINICAL DATA:  Cough and shortness of Breath EXAM: CHEST - 2 VIEW COMPARISON:  03/27/2017 FINDINGS: Cardiac shadow is stable. Defibrillator is again noted. Postsurgical changes are seen. The left lung is clear with mild interstitial changes. Right lung again demonstrates diffuse chronic pleuroparenchymal changes similar to that seen on the prior exam. No acute abnormality is noted. IMPRESSION: Chronic changes without acute abnormality. Electronically Signed   By: Inez Catalina M.D.   On: 04/29/2017 15:53   Ct Head Wo Contrast  Result Date: 05/22/2017 CLINICAL DATA:  Increasing weakness. Altered level of consciousness. EXAM: CT HEAD WITHOUT CONTRAST TECHNIQUE: Contiguous axial images were obtained from the base of the skull through the vertex without intravenous contrast. COMPARISON:  02/25/2017 FINDINGS: Brain: There is atrophy and chronic small vessel disease changes. No acute intracranial abnormality. Specifically, no hemorrhage, hydrocephalus, mass lesion, acute infarction, or significant intracranial injury. Vascular: No hyperdense vessel or unexpected calcification. Skull: No acute calvarial abnormality. Sinuses/Orbits: Mucosal thickening throughout the paranasal sinuses. Mastoid air cells are clear. Orbital soft tissues unremarkable. Other: None IMPRESSION: No acute intracranial abnormality. Atrophy, chronic  microvascular disease. Electronically Signed   By: Rolm Baptise M.D.   On: 05/22/2017 11:07   Ct Abdomen Pelvis W Contrast  Result Date: 05/04/2017 CLINICAL DATA:  Constipation, nausea EXAM: CT ABDOMEN AND PELVIS WITH CONTRAST TECHNIQUE: Multidetector CT imaging of the abdomen and pelvis was performed using the standard protocol following bolus administration of intravenous contrast. CONTRAST:  153mL ISOVUE-300 IOPAMIDOL (ISOVUE-300) INJECTION 61% COMPARISON:  03/24/2017, 08/28/2016, 01/13/2016 FINDINGS: Lower chest: Chronic rim enhancing pleural effusion at the right base. Right greater than left lung base fibrosis, similar pattern compared to prior. Volume loss on the right with shift of mediastinal contents to the right. Partially visualized cardiac pacing leads. Borderline cardiomegaly. Moderate hiatal hernia. Hepatobiliary: No focal hepatic abnormality. Calcified gallstone. No biliary dilatation Pancreas: Unremarkable. No pancreatic ductal dilatation or surrounding inflammatory changes. Spleen: Normal in size without focal abnormality. Adrenals/Urinary Tract: Adrenal glands are within normal limits. Kidneys show no hydronephrosis. 9 mm posterior and 7 mm anterior mid right renal indeterminate lesions are again noted. Urinary bladder slightly lobulated in contour but without focal wall thickening. Stomach/Bowel: Stomach is nonenlarged. Fluid-filled distal small bowel loops. Diffuse liquid stools throughout the colon without wall thickening. Moderate amount of formed stools within the rectosigmoid colon. Vascular/Lymphatic: Moderate aortic atherosclerosis. Ectatic infrarenal abdominal aorta up to 2.7 cm. No significantly enlarged lymph nodes. Reproductive: Prostate calcifications. Slightly enlarged prostate gland. Fluid versus high-riding testicle in the right inguinal region. Other: Negative for free air or free fluid. Musculoskeletal: Degenerative changes of  the spine. No acute or suspicious abnormality.  IMPRESSION: 1. Diffuse liquid stools throughout the colon but without colon wall thickening. Moderate formed stools within the rectosigmoid colon, suggesting constipation. 2. Chronic rim enhancing and loculated right lung base pleural effusion. Right greater than left lung base pulmonary fibrosis without significant interval change 3. Gallstone 4. Diffuse aortic atherosclerosis with ectatic infrarenal abdominal aorta up to 2.7 cm. Ectatic abdominal aorta at risk for aneurysm development. Recommend followup by ultrasound in 5 years. This recommendation follows ACR consensus guidelines: White Paper of the ACR Incidental Findings Committee II on Vascular Findings. J Am Coll Radiol 2013; 10:789-794. 5. Moderate hiatal hernia Electronically Signed   By: Donavan Foil M.D.   On: 05/04/2017 21:01   Dg Chest Port 1 View  Result Date: 05/22/2017 CLINICAL DATA:  Shortness of breath. EXAM: PORTABLE CHEST 1 VIEW COMPARISON:  04/29/2017. FINDINGS: AICD in stable position. Stable cardiomegaly. Stable bilateral interstitial prominence consistent chronic interstitial lung disease. Stable right pleural thickening consistent scarring. Prior cardiac valve replacement. No acute bony abnormality. IMPRESSION: 1. Stable changes of chronic interstitial lung disease and right-sided pleural scarring. 2. AICD in stable position. Prior cardiac valve replacement. Stable cardiomegaly. Electronically Signed   By: Marcello Moores  Register   On: 05/22/2017 10:23     CBC Recent Labs  Lab 05/22/17 1003 05/23/17 0424  WBC 7.8 7.6  HGB 14.3 13.5  HCT 42.7 39.8*  PLT 183 165  MCV 90.7 91.0  MCH 30.4 30.9  MCHC 33.5 33.9  RDW 15.4* 15.2*    Chemistries  Recent Labs  Lab 05/22/17 1003 05/23/17 0424  NA 135 135  K 4.6 4.5  CL 100* 100*  CO2 28 28  GLUCOSE 120* 137*  BUN 18 16  CREATININE 0.88 0.85  CALCIUM 9.0 9.1  AST 30  --   ALT 19  --   ALKPHOS 77  --   BILITOT 0.6  --     ------------------------------------------------------------------------------------------------------------------ estimated creatinine clearance is 71.6 mL/min (by C-G formula based on SCr of 0.85 mg/dL). ------------------------------------------------------------------------------------------------------------------ No results for input(s): HGBA1C in the last 72 hours. ------------------------------------------------------------------------------------------------------------------ No results for input(s): CHOL, HDL, LDLCALC, TRIG, CHOLHDL, LDLDIRECT in the last 72 hours. ------------------------------------------------------------------------------------------------------------------ Recent Labs    05/22/17 1630  TSH 2.678   ------------------------------------------------------------------------------------------------------------------ No results for input(s): VITAMINB12, FOLATE, FERRITIN, TIBC, IRON, RETICCTPCT in the last 72 hours.  Coagulation profile No results for input(s): INR, PROTIME in the last 168 hours.  No results for input(s): DDIMER in the last 72 hours.  Cardiac Enzymes No results for input(s): CKMB, TROPONINI, MYOGLOBIN in the last 168 hours.  Invalid input(s): CK ------------------------------------------------------------------------------------------------------------------ Invalid input(s): POCBNP    Assessment & Plan   Halen Boley  is a 83 y.o. male with a known history of cardiomyopathy with EF of 45%, COPD on 2 L home oxygen, remote history of smoking, paroxysmal A. fib, hyperlipidemia, valvular heart disease, benign prostatic hypertrophy requiring self-catheterization twice a day presents to hospital secondary to worsening weakness and sleepiness.  1.  Cough shortness of breath I have ordered a CT scan of the chest results currently pending   2.  Acute cystitis-gentle hydration, urine cultures -Started on Levaquin as he is allergic to  penicillins  3.  Paroxysmal atrial fibrillation-continue digoxin Rate well controlled.  Patient is status post AICD and pacemaker -Follows with outpatient cardiologist.  On aspirin for anticoagulation.  4.  GERD-Protonix  5.  BPH-with urinary retention, status post self catheterizations.  Will continue  6.  Chronic  hypotension-continue hydrocortisone and midodrine  7  DVT prophylaxis-Lovenox          Code Status Orders  (From admission, onward)        Start     Ordered   05/22/17 1610  Do not attempt resuscitation (DNR)  Continuous    Question Answer Comment  In the event of cardiac or respiratory ARREST Do not call a "code blue"   In the event of cardiac or respiratory ARREST Do not perform Intubation, CPR, defibrillation or ACLS   In the event of cardiac or respiratory ARREST Use medication by any route, position, wound care, and other measures to relive pain and suffering. May use oxygen, suction and manual treatment of airway obstruction as needed for comfort.      05/22/17 1609    Code Status History    Date Active Date Inactive Code Status Order ID Comments User Context   11/17/2016 1631 11/20/2016 1602 DNR [id number]  Vaughan Basta, MD Inpatient   08/19/2016 1315 08/30/2016 1728 DNR [id number]  Harrie Foreman, MD Inpatient   03/02/2016 2236 03/05/2016 2020 DNR [id number]  Mikael Spray, NP ED   01/09/2016 0658 01/14/2016 1939 DNR [id number]  Harrie Foreman, MD Inpatient   11/09/2015 0318 11/12/2015 1713 DNR [id number]  Harrie Foreman, MD Inpatient   06/14/2015 1639 06/18/2015 1513 DNR [id number]  Demetrios Loll, MD Inpatient    Advance Directive Documentation     Most Recent Value  Type of Advance Directive  Healthcare Power of Attorney, Living will, Out of facility DNR (pink MOST or yellow form)  Pre-existing out of facility DNR order (yellow form or pink MOST form)  Pink MOST form placed in chart (order not valid for inpatient use)  "MOST" Form in  Place?  -           Consults  none  DVT Prophylaxis  Lovenox   Lab Results  Component Value Date   PLT 165 05/23/2017     Time Spent in minutes   57min  Greater than 50% of time spent in care coordination and counseling patient regarding the condition and plan of care.   Dustin Flock M.D on 05/23/2017 at 2:09 PM  Between 7am to 6pm - Pager - [phone]  After 6pm go to [email or web address] - Proofreader  [employer]   Office  [phone]

## 2017-05-23 NOTE — Progress Notes (Signed)
PT Cancellation Note  Patient Details Name: Russell Howard MRN: [id number] DOB: May 04, 1934   Cancelled Treatment:    Reason Eval/Treat Not Completed: Fatigue/lethargy limiting ability to participate.  Try as time and pt allow.   Ramond Dial 05/23/2017, 11:27 AM   Mee Hives, PT MS Acute Rehab Dept. Number: [place] [facility] [place]

## 2017-05-24 DIAGNOSIS — R0600 Dyspnea, unspecified: Secondary | ICD-10-CM

## 2017-05-24 LAB — BASIC METABOLIC PANEL
Anion gap: 7 (ref 5–15)
BUN: 17 mg/dL (ref 6–20)
CALCIUM: 9 mg/dL (ref 8.9–10.3)
CHLORIDE: 98 mmol/L — AB (ref 101–111)
CO2: 28 mmol/L (ref 22–32)
CREATININE: 0.92 mg/dL (ref 0.61–1.24)
GFR calc Af Amer: >60 mL/min (ref >60)
GFR calc non Af Amer: >60 mL/min (ref >60)
GLUCOSE: 119 mg/dL — AB (ref 65–99)
Potassium: 4.9 mmol/L (ref 3.5–5.1)
Sodium: 133 mmol/L — ABNORMAL LOW (ref 135–145)

## 2017-05-24 LAB — CBC
HEMATOCRIT: 39.9 % — AB (ref 40.0–52.0)
HEMOGLOBIN: 13.3 g/dL (ref 13.0–18.0)
MCH: 30.3 pg (ref 26.0–34.0)
MCHC: 33.3 g/dL (ref 32.0–36.0)
MCV: 90.9 fL (ref 80.0–100.0)
Platelets: 162 10*3/uL (ref 150–440)
RBC: 4.39 MIL/uL — ABNORMAL LOW (ref 4.40–5.90)
RDW: 15.2 % — AB (ref 11.5–14.5)
WBC: 6.4 10*3/uL (ref 3.8–10.6)

## 2017-05-24 LAB — URINE CULTURE: Culture: 70000 — AB

## 2017-05-24 MED ORDER — SODIUM CHLORIDE 0.9 % IV SOLN
100.0000 mg | Freq: Two times a day (BID) | INTRAVENOUS | Status: DC
  Administered 2017-05-24 – 2017-05-26 (×4): 100 mg via INTRAVENOUS
  Filled 2017-05-24 (×5): qty 100

## 2017-05-24 MED ORDER — PROMETHAZINE HCL 25 MG/ML IJ SOLN
12.5000 mg | Freq: Four times a day (QID) | INTRAMUSCULAR | Status: DC | PRN
  Administered 2017-05-24 – 2017-05-25 (×2): 12.5 mg via INTRAVENOUS
  Filled 2017-05-24 (×2): qty 1

## 2017-05-24 MED ORDER — APIXABAN 5 MG PO TABS
10.0000 mg | ORAL_TABLET | Freq: Two times a day (BID) | ORAL | Status: DC
  Administered 2017-05-24 – 2017-05-29 (×10): 10 mg via ORAL
  Filled 2017-05-24 (×10): qty 2

## 2017-05-24 MED ORDER — BENZONATATE 100 MG PO CAPS
200.0000 mg | ORAL_CAPSULE | Freq: Three times a day (TID) | ORAL | Status: DC
  Administered 2017-05-24 – 2017-05-28 (×14): 200 mg via ORAL
  Filled 2017-05-24 (×15): qty 2

## 2017-05-24 MED ORDER — IPRATROPIUM-ALBUTEROL 0.5-2.5 (3) MG/3ML IN SOLN
3.0000 mL | RESPIRATORY_TRACT | Status: DC | PRN
  Filled 2017-05-24: qty 3

## 2017-05-24 MED ORDER — APIXABAN 5 MG PO TABS
5.0000 mg | ORAL_TABLET | Freq: Two times a day (BID) | ORAL | Status: DC
Start: 2017-05-31 — End: 2017-05-29

## 2017-05-24 MED ORDER — GUAIFENESIN-CODEINE 100-10 MG/5ML PO SOLN
15.0000 mL | ORAL | Status: DC | PRN
  Administered 2017-05-24 – 2017-05-27 (×7): 15 mL via ORAL
  Filled 2017-05-24 (×7): qty 15

## 2017-05-24 MED ORDER — SODIUM CHLORIDE 0.9 % IV BOLUS
500.0000 mL | Freq: Once | INTRAVENOUS | Status: AC
  Administered 2017-05-24: 500 mL via INTRAVENOUS

## 2017-05-24 MED ORDER — BUDESONIDE 0.25 MG/2ML IN SUSP
0.2500 mg | Freq: Two times a day (BID) | RESPIRATORY_TRACT | Status: DC
  Administered 2017-05-26: 0.25 mg via RESPIRATORY_TRACT
  Filled 2017-05-24 (×7): qty 2

## 2017-05-24 MED ORDER — KETOROLAC TROMETHAMINE 30 MG/ML IJ SOLN
15.0000 mg | Freq: Four times a day (QID) | INTRAMUSCULAR | Status: AC | PRN
  Administered 2017-05-24 – 2017-05-26 (×3): 15 mg via INTRAVENOUS
  Filled 2017-05-24 (×4): qty 1

## 2017-05-24 MED ORDER — METHYLPREDNISOLONE SODIUM SUCC 125 MG IJ SOLR
60.0000 mg | Freq: Four times a day (QID) | INTRAMUSCULAR | Status: DC
  Administered 2017-05-24 – 2017-05-26 (×8): 60 mg via INTRAVENOUS
  Filled 2017-05-24 (×8): qty 2

## 2017-05-24 NOTE — Progress Notes (Signed)
Patient was looking better than yesterday this morning; however, at 7:45 he VOMITED and began coughing even more forcefully.    IV Zofran was given.  It was not effective, so the MD ordered additional medications (see MAR).  They were given.  O2 was increased to 4L because his stats were low.    10am oral meds were held because he continues to heave and almost vomited from a sip of water.  Phillis Knack, RN

## 2017-05-24 NOTE — Progress Notes (Signed)
RN reported BP to Dr. Posey Pronto.  He gave a verbal order for a normal saline bolus of 500 mL.  Bolus was complete and patient reported leaking from the IV site.  RN inspected the site.  IV site was leaking but there was no damage to the tissue and no swelling.  IV was removed.  Patient informed RN that he is a hard stick.  IV consult was put in for the IV team.  He has medicine due via IV but it cannot be given because he does not have an IV at this time.  Phillis Knack, RN

## 2017-05-24 NOTE — Consult Note (Signed)
ANTICOAGULATION CONSULT NOTE - Initial Consult  Pharmacy Consult for apixaban Indication: atrial fibrillation and pulmonary embolus  Allergies  Allergen Reactions  . Penicillin G Hives    Has patient had a PCN reaction causing immediate rash, facial/tongue/throat swelling, SOB or lightheadedness with hypotension: Yes Has patient had a PCN reaction causing severe rash involving mucus membranes or skin necrosis: No Has patient had a PCN reaction that required hospitalization No Has patient had a PCN reaction occurring within the last 10 years: No If all of the above answers are "NO", then may proceed with Cephalosporin use.  . Sulfa Antibiotics Rash    Patient Measurements: Height: 5\' 10"  (177.8 cm) Weight: 179 lb 14.3 oz (81.6 kg) IBW/kg (Calculated) : 73 Heparin Dosing Weight:   Vital Signs: Temp: 98.5 F (36.9 C) (04/21 0449) Temp Source: Oral (04/21 0449) BP: 85/58 (04/21 0856) Pulse Rate: 79 (04/21 1059)  Labs: Recent Labs    05/22/17 1003 05/23/17 0424 05/24/17 0446  HGB 14.3 13.5 13.3  HCT 42.7 39.8* 39.9*  PLT 183 165 162  CREATININE 0.88 0.85 0.92    Estimated Creatinine Clearance: 66.1 mL/min (by C-G formula based on SCr of 0.92 mg/dL).   Medical History: Past Medical History:  Diagnosis Date  . Acquired hypothyroidism 11/02/2014  . BPH (benign prostatic hyperplasia)   . Cardiac defibrillator in place 11/02/2014  . Cardiomyopathy ([place])   . CHF (congestive heart failure) ([place])   . Chronic diastolic heart failure ([place]) 11/02/2014  . COPD (chronic obstructive pulmonary disease) ([place])   . Gastro-esophageal reflux disease without esophagitis 11/02/2014  . H/O malignant neoplasm 11/09/2014  . Heart valve disease 11/02/2014  . History of atrial fibrillation 11/02/2014  . Lung cancer ([place])   . Neuropathy 11/02/2014  . Orthostasis 11/02/2014  . Pure hypercholesterolemia 11/02/2014  . Valvular heart disease     Medications:  Scheduled:  . acidophilus  1 capsule  Oral QPM  . apixaban  10 mg Oral BID   Followed by  . [START ON 05/31/2017] apixaban  5 mg Oral BID  . aspirin EC  81 mg Oral Daily  . benzonatate  200 mg Oral TID  . budesonide (PULMICORT) nebulizer solution  0.25 mg Nebulization BID  . cholecalciferol  5,000 Units Oral Daily  . digoxin  0.125 mg Oral Daily  . famotidine  20 mg Oral Daily  . fluticasone  2 spray Each Nare Daily  . gabapentin  600 mg Oral BID  . guaiFENesin  600 mg Oral BID  . ipratropium-albuterol  3 mL Nebulization Q6H  . levothyroxine  100 mcg Oral QAC breakfast  . magnesium oxide  400 mg Oral QPM  . methenamine  1,000 mg Oral BID WC  . methylPREDNISolone (SOLU-MEDROL) injection  60 mg Intravenous Q6H  . midodrine  10 mg Oral TID  . multivitamin with minerals  1 tablet Oral Daily  . pantoprazole  40 mg Oral QAC breakfast  . pyridostigmine  60 mg Oral BID  . simvastatin  20 mg Oral QPM  . vitamin B-12  500 mcg Oral Daily  . vitamin C  500 mg Oral BID  . vitamin E  400 Units Oral QPM    Assessment: Patient is a 83 year old male with PMH of afib, CHF, lung cancer, valvular heart disease, COPD with a chronic right lung PE. Pt has been on lovenox therapuetic dose since yesterday. Now transitioning to apixaban. Last lovenox dose at 0500 today.  Goal of Therapy:   Monitor platelets  by anticoagulation protocol: Yes   Plan:  apixaban 10mg  BID x 7 days then 5mg  BID. Will give first dose at 1700 tonight. Please consider d/c ASA  Pamala Hayman D Canary Fister, Pharm.D, BCPS Clinical Pharmacist  05/24/2017,11:18 AM

## 2017-05-24 NOTE — Progress Notes (Signed)
PT Cancellation Note  Patient Details Name: Russell Howard MRN: [id number] DOB: 20-Mar-1934   Cancelled Treatment:    Reason Eval/Treat Not Completed: Other (comment).  Pt is in bed with emesis bag, refuses OOB now and not committing to up later as he feels so poorly.  Try later as pt will allow.   Ramond Dial 05/24/2017, 11:12 AM   Mee Hives, PT MS Acute Rehab Dept. Number: [place] [facility] [place]

## 2017-05-24 NOTE — Consult Note (Signed)
Reason for Consult:Cough Referring Physician: Dr. Briant Sites Russell Howard is an 83 y.o. male.  HPI: Russell Howard is a very pleasant 83 year old gentleman with a past medical history remarkable for congestive heart failure, ejection fraction 45%, has implantable defibrillator, atrial fibrillation, valvular heart disease, hypercholesterolemia, underlying history of COPD on 2 L nasal cannula at home, history of lung cancer, status post radiation treatment to the right lung with residual extensive right lung radiation fibrosis, bronchiectasis and pleural effusions, BPH and hypothyroidism, was brought in secondary to weakness, increased somnolence and altered mental status.  This was felt to be secondary to sepsis/infection.  He was initially hypotensive, had complained of chills, does have a history of baseline hypotension on hydrocortisone and mid a drain.  He also has had some worsening shortness of breath above baseline.  He states that he coughs continuously throughout the day, no specific recollection for day or night, and this is been going on for a long time per patient.  Denies any significant wheezing.  No sputum production.  I inquired about his medication the patient cannot remember if he took  specifically ACE inhibitors.  Past Medical History:  Diagnosis Date  . Acquired hypothyroidism 11/02/2014  . BPH (benign prostatic hyperplasia)   . Cardiac defibrillator in place 11/02/2014  . Cardiomyopathy ([place])   . CHF (congestive heart failure) ([place])   . Chronic diastolic heart failure ([place]) 11/02/2014  . COPD (chronic obstructive pulmonary disease) ([place])   . Gastro-esophageal reflux disease without esophagitis 11/02/2014  . H/O malignant neoplasm 11/09/2014  . Heart valve disease 11/02/2014  . History of atrial fibrillation 11/02/2014  . Lung cancer ([place])   . Neuropathy 11/02/2014  . Orthostasis 11/02/2014  . Pure hypercholesterolemia 11/02/2014  . Valvular heart disease     Past Surgical History:   Procedure Laterality Date  . APPENDECTOMY    . CARDIAC VALVE REPLACEMENT     with a bovine valve  . DUAL ICD IMPLANT  2007/2009   implantable cardiac defibrillator  . PORT-A-CATH REMOVAL      Family History  Problem Relation Age of Onset  . Hypertension Unknown   . Heart disease Unknown   . CAD Father     Social History:  reports that he quit smoking about 35 years ago. His smoking use included cigarettes. He has never used smokeless tobacco. He reports that he does not drink alcohol or use drugs.  Allergies:  Allergies  Allergen Reactions  . Penicillin G Hives    Has patient had a PCN reaction causing immediate rash, facial/tongue/throat swelling, SOB or lightheadedness with hypotension: Yes Has patient had a PCN reaction causing severe rash involving mucus membranes or skin necrosis: No Has patient had a PCN reaction that required hospitalization No Has patient had a PCN reaction occurring within the last 10 years: No If all of the above answers are "NO", then may proceed with Cephalosporin use.  . Sulfa Antibiotics Rash    Medications: I have reviewed the patient's current medications.  Results for orders placed or performed during the hospital encounter of 05/22/17 (from the past 48 hour(s))  Urine Culture     Status: Abnormal   Collection Time: 05/22/17  3:11 PM  Result Value Ref Range   Specimen Description      URINE, RANDOM Performed at [facility], [address], [place], [place] [address]    Special Requests      NONE Performed at [facility], [address] [place], [place] [address]  Culture (A)     70,000 COLONIES/mL METHICILLIN RESISTANT STAPHYLOCOCCUS AUREUS   Report Status 05/24/2017 FINAL    Organism ID, Bacteria METHICILLIN RESISTANT STAPHYLOCOCCUS AUREUS (A)       Susceptibility   Methicillin resistant staphylococcus aureus - MIC*    CIPROFLOXACIN >=8 RESISTANT Resistant     GENTAMICIN <=0.5 SENSITIVE Sensitive      NITROFURANTOIN <=16 SENSITIVE Sensitive     OXACILLIN >=4 RESISTANT Resistant     TETRACYCLINE <=1 SENSITIVE Sensitive     VANCOMYCIN <=0.5 SENSITIVE Sensitive     TRIMETH/SULFA <=10 SENSITIVE Sensitive     CLINDAMYCIN <=0.25 SENSITIVE Sensitive     RIFAMPIN <=0.5 SENSITIVE Sensitive     Inducible Clindamycin NEGATIVE Sensitive     * 70,000 COLONIES/mL METHICILLIN RESISTANT STAPHYLOCOCCUS AUREUS  TSH     Status: None   Collection Time: 05/22/17  4:30 PM  Result Value Ref Range   TSH 2.678 0.350 - 4.500 uIU/mL    Comment: Performed by a 3rd Generation assay with a functional sensitivity of <=0.01 uIU/mL. Performed at [facility], [address] [place], [place] [address]   Basic metabolic panel     Status: Abnormal   Collection Time: 05/23/17  4:24 AM  Result Value Ref Range   Sodium 135 135 - 145 mmol/L   Potassium 4.5 3.5 - 5.1 mmol/L   Chloride 100 (L) 101 - 111 mmol/L   CO2 28 22 - 32 mmol/L   Glucose, Bld 137 (H) 65 - 99 mg/dL   BUN 16 6 - 20 mg/dL   Creatinine, Ser 0.85 0.61 - 1.24 mg/dL   Calcium 9.1 8.9 - 10.3 mg/dL   GFR calc non Af Amer >60 >60 mL/min   GFR calc Af Amer >60 >60 mL/min    Comment: (NOTE) The eGFR has been calculated using the CKD EPI equation. This calculation has not been validated in all clinical situations. eGFR's persistently <60 mL/min signify possible Chronic Kidney Disease.    Anion gap 7 5 - 15    Comment: Performed at [facility], [address] [place], [place] [address]  CBC     Status: Abnormal   Collection Time: 05/23/17  4:24 AM  Result Value Ref Range   WBC 7.6 3.8 - 10.6 K/uL   RBC 4.37 (L) 4.40 - 5.90 MIL/uL   Hemoglobin 13.5 13.0 - 18.0 g/dL   HCT 39.8 (L) 40.0 - 52.0 %   MCV 91.0 80.0 - 100.0 fL   MCH 30.9 26.0 - 34.0 pg   MCHC 33.9 32.0 - 36.0 g/dL   RDW 15.2 (H) 11.5 - 14.5 %   Platelets 165 150 - 440 K/uL    Comment: Performed at [facility], [address] [place], [place] [address]   Brain natriuretic peptide     Status: None   Collection Time: 05/23/17  4:24 AM  Result Value Ref Range   B Natriuretic Peptide 84.0 0.0 - 100.0 pg/mL    Comment: Performed at [facility], [address] [place], [place] [address]  Procalcitonin - Baseline     Status: None   Collection Time: 05/23/17  4:24 AM  Result Value Ref Range   Procalcitonin <0.10 ng/mL    Comment:        Interpretation: PCT (Procalcitonin) <= 0.5 ng/mL: Systemic infection (sepsis) is not likely. Local bacterial infection is possible. (NOTE)       Sepsis PCT Algorithm  Lower Respiratory Tract                                      Infection PCT Algorithm    ----------------------------     ----------------------------         PCT < 0.25 ng/mL                PCT < 0.10 ng/mL         Strongly encourage             Strongly discourage   discontinuation of antibiotics    initiation of antibiotics    ----------------------------     -----------------------------       PCT 0.25 - 0.50 ng/mL            PCT 0.10 - 0.25 ng/mL               OR       >80% decrease in PCT            Discourage initiation of                                            antibiotics      Encourage discontinuation           of antibiotics    ----------------------------     -----------------------------         PCT >= 0.50 ng/mL              PCT 0.26 - 0.50 ng/mL               AND        <80% decrease in PCT             Encourage initiation of                                             antibiotics       Encourage continuation           of antibiotics    ----------------------------     -----------------------------        PCT >= 0.50 ng/mL                  PCT > 0.50 ng/mL               AND         increase in PCT                  Strongly encourage                                      initiation of antibiotics    Strongly encourage escalation           of antibiotics                                      -----------------------------  PCT <= 0.25 ng/mL                                                 OR                                        > 80% decrease in PCT                                     Discontinue / Do not initiate                                             antibiotics Performed at [facility], [address] [place], [place] [address]   CBC     Status: Abnormal   Collection Time: 05/24/17  4:46 AM  Result Value Ref Range   WBC 6.4 3.8 - 10.6 K/uL   RBC 4.39 (L) 4.40 - 5.90 MIL/uL   Hemoglobin 13.3 13.0 - 18.0 g/dL   HCT 39.9 (L) 40.0 - 52.0 %   MCV 90.9 80.0 - 100.0 fL   MCH 30.3 26.0 - 34.0 pg   MCHC 33.3 32.0 - 36.0 g/dL   RDW 15.2 (H) 11.5 - 14.5 %   Platelets 162 150 - 440 K/uL    Comment: Performed at [facility], [address] [place], [place] [address]  Basic metabolic panel     Status: Abnormal   Collection Time: 05/24/17  4:46 AM  Result Value Ref Range   Sodium 133 (L) 135 - 145 mmol/L   Potassium 4.9 3.5 - 5.1 mmol/L   Chloride 98 (L) 101 - 111 mmol/L   CO2 28 22 - 32 mmol/L   Glucose, Bld 119 (H) 65 - 99 mg/dL   BUN 17 6 - 20 mg/dL   Creatinine, Ser 0.92 0.61 - 1.24 mg/dL   Calcium 9.0 8.9 - 10.3 mg/dL   GFR calc non Af Amer >60 >60 mL/min   GFR calc Af Amer >60 >60 mL/min    Comment: (NOTE) The eGFR has been calculated using the CKD EPI equation. This calculation has not been validated in all clinical situations. eGFR's persistently <60 mL/min signify possible Chronic Kidney Disease.    Anion gap 7 5 - 15    Comment: Performed at [facility], [address] [place], [place] [address]    Ct Head Wo Contrast  Result Date: 05/22/2017 CLINICAL DATA:  Increasing weakness. Altered level of consciousness. EXAM: CT HEAD WITHOUT CONTRAST TECHNIQUE: Contiguous axial images were obtained from the base of the skull through the vertex without intravenous contrast. COMPARISON:   02/25/2017 FINDINGS: Brain: There is atrophy and chronic small vessel disease changes. No acute intracranial abnormality. Specifically, no hemorrhage, hydrocephalus, mass lesion, acute infarction, or significant intracranial injury. Vascular: No hyperdense vessel or unexpected calcification. Skull: No acute calvarial abnormality. Sinuses/Orbits: Mucosal thickening throughout the paranasal sinuses. Mastoid air cells are clear. Orbital soft tissues unremarkable. Other: None IMPRESSION: No acute intracranial abnormality. Atrophy, chronic microvascular disease. Electronically Signed   By: Rolm Baptise M.D.  On: 05/22/2017 11:07   Ct Chest W Contrast  Result Date: 05/23/2017 CLINICAL DATA:  Cough and congestion worsening for few days. Shortness of breath. History of COPD, cardiomyopathy, lung cancer. EXAM: CT CHEST WITH CONTRAST TECHNIQUE: Multidetector CT imaging of the chest was performed during intravenous contrast administration. CONTRAST:  59m ISOVUE-300 IOPAMIDOL (ISOVUE-300) INJECTION 61% COMPARISON:  Chest radiograph April 19th 2019 and CT chest November 10, 2015 and CT chest September 07, 2015 FINDINGS: CARDIOVASCULAR: The heart is mildly enlarged and unchanged. No pericardial effusion. Severe coronary artery calcifications. Thoracic aorta is normal course and caliber with moderate calcific atherosclerosis. large mural based filling defect RIGHT main pulmonary artery narrowing the lumen by at least 50%. Propagation of soft tissue into RIGHT upper lobe pulmonary artery with partial occlusion. MEDIASTINUM/NODES: Similar mediastinal, worsening hilar lymphadenopathy. LEFT AICD. LUNGS/PLEURA: Similar RIGHT lung bronchiectasis, with bilateral subpleural fibrosis. Faint ground-glass nodules LEFT lower lobe new from prior CT. LEFT calcified granuloma. Stable moderate RIGHT pleural effusion. UPPER ABDOMEN: Nonacute. Small hiatal hernia. Mildly hypodense liver likely reflecting hepatic steatosis. MUSCULOSKELETAL:  Nonacute. Osteopenia and mild degenerative change of the thoracic spine. IMPRESSION: 1. Large chronic RIGHT pulmonary embolism occlusive in RIGHT upper lobe. At least 50% stenosis RIGHT main pulmonary artery. 2. Ground-glass centrilobular nodules LEFT lower lobe may be infectious or inflammatory. Mildly worsening hilar lymphadenopathy. 3. Similar extensive fibrosis with bronchiectasis seen with chronic interstitial lung disease. 4. Chronic RIGHT moderate pleural effusion. 5. Acute findings discussed with and reconfirmed by Dr.SHREYANG PATEL on 05/23/2017 at 2:44 pm. Electronically Signed   By: CElon AlasM.D.   On: 05/23/2017 14:47    ROS Blood pressure (!) 85/58, pulse (!) 111, temperature 98.5 F (36.9 C), temperature source Oral, resp. rate 16, height '5\' 10"'  (1.778 m), weight 179 lb 14.3 oz (81.6 kg), SpO2 97 %. Physical Exam  Patient is awake, alert, states he feels nauseous and has recently split up. Vital signs above HEENT: Trachea is midline, no thyromegaly noted, no stridor, no accessory muscle utilization noted Cardiovascular: Regular rhythm Pulmonary: Markedly diminished breath sounds on the right, bronchial breath sounds appreciated on the right Abdominal: Positive bowel sounds, soft exam Extremities: No clubbing cyanosis or edema noted Neurologic: Patient was all extremities Cutaneous: No rashes or lesions appreciated  Assessment/Plan:  Cough/shortness of breath.  While some of this may be COPD exacerbation, he is being treated appropriately with budesonide, albuterol, Atrovent, supplemental oxygen, Levaquin and Solu-Medrol.    His right lung shows chronic extensive disease to include calcification in the airways, severe bronchiectasis with a component of fibrosis, chronic loculated effusions on the right.  This is chronic and can be documented back as far as 2017 on CT scan. Chronic thromboembolism in the right pulmonary artery with intraluminal irregularities. Agree with  chronic anticoagulation if able to tolerate. Would repeat echo to assess PA pressures. If he was younger and in better physiologic shape he may be considered for pulmonary endarterectomy for CTEPH based on echo result.   With regards to his chronic cough he has multiple etiologies most likely is chronic right-sided disease, radiation fibrosis, bronchiectasis, loculated effusions along with his COPD.  He is not taking any antihypertensive medication and none of his other medications are implicated.  Limited therapeutics other than what you are doing along with antitussives.  Acute cystitis.  Presently on vancomycin and Levaquin.  Urine grew MRSA  BPH.  Patient uses frequent self-catheterization  Gastroesophageal reflux disease.  On PPI  History of atrial fibrillation, status post pacemaker  defibrillator with rate control with digoxin  Patient has been seen in the outpatient clinic by Dr. Alva Garnet would recommend follow-up to him after discharge.  No additional recommendations at this point.   Dorsie Burich 05/24/2017, 10:18 AM

## 2017-05-24 NOTE — Progress Notes (Signed)
Patient has refused svn treatments today

## 2017-05-24 NOTE — Progress Notes (Signed)
Patient reported pain all over.  Dr. Posey Pronto ordered new IV medicine because patient is still having nausea.  See MAR, IV medicine given.  Patient and family do self-cath twice a day at home.  RN spoke with Dr. Posey Pronto.  He gave permission for the family to have an in-out cath kit.  200 mL urine was removed via the in out cath.  Daughter did the cath for the patient using the kit.  Family is with the patient.   Phillis Knack, RN

## 2017-05-24 NOTE — Progress Notes (Signed)
[place] at [facility]                                                                                                                                                                                  Patient Demographics   Russell Howard, is a 83 y.o. male, DOB - 1934-10-12, EGB:[id number]  Admit date - 05/22/2017   Admitting Physician Gladstone Lighter, MD  Outpatient Primary MD for the patient is Sparks, Leonie Douglas, MD   LOS - 2  Subjective: Patient having severe cough he was seen by pulmonary earlier    Review of Systems:   CONSTITUTIONAL: No documented fever. No fatigue, weakness. No weight gain, no weight loss.  EYES: No blurry or double vision.  ENT: No tinnitus. No postnasal drip. No redness of the oropharynx.  RESPIRATORY: +cough, no wheeze, no hemoptysis. + dyspnea.  CARDIOVASCULAR: No chest pain. No orthopnea. No palpitations. No syncope.  GASTROINTESTINAL: No nausea, no vomiting or diarrhea. No abdominal pain. No melena or hematochezia.  GENITOURINARY: No dysuria or hematuria.  ENDOCRINE: No polyuria or nocturia. No heat or cold intolerance.  HEMATOLOGY: No anemia. No bruising. No bleeding.  INTEGUMENTARY: No rashes. No lesions.  MUSCULOSKELETAL: No arthritis. No swelling. No gout.  NEUROLOGIC: No numbness, tingling, or ataxia. No seizure-type activity.  PSYCHIATRIC: No anxiety. No insomnia. No ADD.    Vitals:   Vitals:   05/24/17 0449 05/24/17 0856 05/24/17 1059 05/24/17 1141  BP: 98/60 (!) 85/58    Pulse: 91 (!) 111 79 (!) 44  Resp: 16     Temp: 98.5 F (36.9 C)     TempSrc: Oral     SpO2: 95% 97% (!) 88% 100%  Weight:      Height:        Wt Readings from Last 3 Encounters:  05/22/17 81.6 kg (179 lb 14.3 oz)  05/04/17 77.1 kg (170 lb)  04/29/17 77.1 kg (170 lb)     Intake/Output Summary (Last 24 hours) at 05/24/2017 1318 Last data filed at 05/24/2017 1300 Gross per 24 hour  Intake 480 ml  Output 1550 ml  Net -1070 ml     Physical Exam:   GENERAL: Pleasant-appearing in no apparent distress.  HEAD, EYES, EARS, NOSE AND THROAT: Atraumatic, normocephalic. Extraocular muscles are intact. Pupils equal and reactive to light. Sclerae anicteric. No conjunctival injection. No oro-pharyngeal erythema.  NECK: Supple. There is no jugular venous distention. No bruits, no lymphadenopathy, no thyromegaly.  HEART: Regular rate and rhythm,. No murmurs, no rubs, no clicks.  LUNGS: Clear to auscultation bilaterally. No rales or rhonchi. No wheezes.  ABDOMEN: Soft, flat, nontender, nondistended. Has good bowel sounds. No hepatosplenomegaly  appreciated.  EXTREMITIES: No evidence of any cyanosis, clubbing, or peripheral edema.  +2 pedal and radial pulses bilaterally.  NEUROLOGIC: The patient is alert, awake, and oriented x3 with no focal motor or sensory deficits appreciated bilaterally.  SKIN: Moist and warm with no rashes appreciated.  Psych: Not anxious, depressed LN: No inguinal LN enlargement    Antibiotics   Anti-infectives (From admission, onward)   Start     Dose/Rate Route Frequency Ordered Stop   05/23/17 1000  levofloxacin (LEVAQUIN) IVPB 750 mg  Status:  Discontinued     750 mg 100 mL/hr over 90 Minutes Intravenous Every 24 hours 05/22/17 1338 05/22/17 1504   05/23/17 1000  levofloxacin (LEVAQUIN) IVPB 750 mg     750 mg 100 mL/hr over 90 Minutes Intravenous Every 24 hours 05/22/17 1504     05/22/17 1800  aztreonam (AZACTAM) 2 g in sodium chloride 0.9 % 100 mL IVPB  Status:  Discontinued     2 g 200 mL/hr over 30 Minutes Intravenous Every 8 hours 05/22/17 1338 05/22/17 1504   05/22/17 1800  vancomycin (VANCOCIN) IVPB 1000 mg/200 mL premix  Status:  Discontinued     1,000 mg 200 mL/hr over 60 Minutes Intravenous Every 12 hours 05/22/17 1338 05/22/17 1504   05/22/17 1000  levofloxacin (LEVAQUIN) IVPB 750 mg     750 mg 100 mL/hr over 90 Minutes Intravenous  Once 05/22/17 0949 05/22/17 1317   05/22/17 1000   aztreonam (AZACTAM) 2 g in sodium chloride 0.9 % 100 mL IVPB     2 g 200 mL/hr over 30 Minutes Intravenous  Once 05/22/17 0949 05/22/17 1135   05/22/17 1000  vancomycin (VANCOCIN) IVPB 1000 mg/200 mL premix     1,000 mg 200 mL/hr over 60 Minutes Intravenous  Once 05/22/17 0949 05/22/17 1317      Medications   Scheduled Meds: . acidophilus  1 capsule Oral QPM  . apixaban  10 mg Oral BID   Followed by  . [START ON 05/31/2017] apixaban  5 mg Oral BID  . aspirin EC  81 mg Oral Daily  . benzonatate  200 mg Oral TID  . budesonide (PULMICORT) nebulizer solution  0.25 mg Nebulization BID  . cholecalciferol  5,000 Units Oral Daily  . digoxin  0.125 mg Oral Daily  . famotidine  20 mg Oral Daily  . fluticasone  2 spray Each Nare Daily  . gabapentin  600 mg Oral BID  . guaiFENesin  600 mg Oral BID  . ipratropium-albuterol  3 mL Nebulization Q6H  . levothyroxine  100 mcg Oral QAC breakfast  . magnesium oxide  400 mg Oral QPM  . methenamine  1,000 mg Oral BID WC  . methylPREDNISolone (SOLU-MEDROL) injection  60 mg Intravenous Q6H  . midodrine  10 mg Oral TID  . multivitamin with minerals  1 tablet Oral Daily  . pantoprazole  40 mg Oral QAC breakfast  . pyridostigmine  60 mg Oral BID  . simvastatin  20 mg Oral QPM  . vitamin B-12  500 mcg Oral Daily  . vitamin C  500 mg Oral BID  . vitamin E  400 Units Oral QPM   Continuous Infusions: . levofloxacin (LEVAQUIN) IV 750 mg (05/24/17 1222)   PRN Meds:.acetaminophen **OR** acetaminophen, guaiFENesin-codeine, ipratropium-albuterol, ketorolac, ondansetron **OR** ondansetron (ZOFRAN) IV, promethazine   Data Review:   Micro Results Recent Results (from the past 240 hour(s))  Blood Culture (routine x 2)     Status: None (Preliminary result)  Collection Time: 05/22/17 10:03 AM  Result Value Ref Range Status   Specimen Description BLOOD RIGHT FA  Final   Special Requests   Final    BOTTLES DRAWN AEROBIC AND ANAEROBIC Blood Culture adequate  volume   Culture   Final    NO GROWTH 2 DAYS Performed at [facility], [address], [place], [place] [address]    Report Status PENDING  Incomplete  Blood Culture (routine x 2)     Status: None (Preliminary result)   Collection Time: 05/22/17 10:04 AM  Result Value Ref Range Status   Specimen Description BLOOD RIGHT [facility]  Final   Special Requests   Final    BOTTLES DRAWN AEROBIC AND ANAEROBIC Blood Culture adequate volume   Culture   Final    NO GROWTH 2 DAYS Performed at [facility], [address], [place], [place] [address]    Report Status PENDING  Incomplete  Urine Culture     Status: Abnormal   Collection Time: 05/22/17  3:11 PM  Result Value Ref Range Status   Specimen Description   Final    URINE, RANDOM Performed at [facility], [address], [place], [place] [address]    Special Requests   Final    NONE Performed at [facility], [address] [place], [place] [address]    Culture (A)  Final    70,000 COLONIES/mL METHICILLIN RESISTANT STAPHYLOCOCCUS AUREUS   Report Status 05/24/2017 FINAL  Final   Organism ID, Bacteria METHICILLIN RESISTANT STAPHYLOCOCCUS AUREUS (A)  Final      Susceptibility   Methicillin resistant staphylococcus aureus - MIC*    CIPROFLOXACIN >=8 RESISTANT Resistant     GENTAMICIN <=0.5 SENSITIVE Sensitive     NITROFURANTOIN <=16 SENSITIVE Sensitive     OXACILLIN >=4 RESISTANT Resistant     TETRACYCLINE <=1 SENSITIVE Sensitive     VANCOMYCIN <=0.5 SENSITIVE Sensitive     TRIMETH/SULFA <=10 SENSITIVE Sensitive     CLINDAMYCIN <=0.25 SENSITIVE Sensitive     RIFAMPIN <=0.5 SENSITIVE Sensitive     Inducible Clindamycin NEGATIVE Sensitive     * 70,000 COLONIES/mL METHICILLIN RESISTANT STAPHYLOCOCCUS AUREUS    Radiology Reports Dg Chest 2 View  Result Date: 04/29/2017 CLINICAL DATA:  Cough and shortness of Breath EXAM: CHEST - 2 VIEW COMPARISON:  03/27/2017 FINDINGS: Cardiac shadow is stable.  Defibrillator is again noted. Postsurgical changes are seen. The left lung is clear with mild interstitial changes. Right lung again demonstrates diffuse chronic pleuroparenchymal changes similar to that seen on the prior exam. No acute abnormality is noted. IMPRESSION: Chronic changes without acute abnormality. Electronically Signed   By: Inez Catalina M.D.   On: 04/29/2017 15:53   Ct Head Wo Contrast  Result Date: 05/22/2017 CLINICAL DATA:  Increasing weakness. Altered level of consciousness. EXAM: CT HEAD WITHOUT CONTRAST TECHNIQUE: Contiguous axial images were obtained from the base of the skull through the vertex without intravenous contrast. COMPARISON:  02/25/2017 FINDINGS: Brain: There is atrophy and chronic small vessel disease changes. No acute intracranial abnormality. Specifically, no hemorrhage, hydrocephalus, mass lesion, acute infarction, or significant intracranial injury. Vascular: No hyperdense vessel or unexpected calcification. Skull: No acute calvarial abnormality. Sinuses/Orbits: Mucosal thickening throughout the paranasal sinuses. Mastoid air cells are clear. Orbital soft tissues unremarkable. Other: None IMPRESSION: No acute intracranial abnormality. Atrophy, chronic microvascular disease. Electronically Signed   By: Rolm Baptise M.D.   On: 05/22/2017 11:07   Ct Chest W Contrast  Result Date: 05/23/2017  CLINICAL DATA:  Cough and congestion worsening for few days. Shortness of breath. History of COPD, cardiomyopathy, lung cancer. EXAM: CT CHEST WITH CONTRAST TECHNIQUE: Multidetector CT imaging of the chest was performed during intravenous contrast administration. CONTRAST:  6mL ISOVUE-300 IOPAMIDOL (ISOVUE-300) INJECTION 61% COMPARISON:  Chest radiograph April 19th 2019 and CT chest November 10, 2015 and CT chest September 07, 2015 FINDINGS: CARDIOVASCULAR: The heart is mildly enlarged and unchanged. No pericardial effusion. Severe coronary artery calcifications. Thoracic aorta is normal  course and caliber with moderate calcific atherosclerosis. large mural based filling defect RIGHT main pulmonary artery narrowing the lumen by at least 50%. Propagation of soft tissue into RIGHT upper lobe pulmonary artery with partial occlusion. MEDIASTINUM/NODES: Similar mediastinal, worsening hilar lymphadenopathy. LEFT AICD. LUNGS/PLEURA: Similar RIGHT lung bronchiectasis, with bilateral subpleural fibrosis. Faint ground-glass nodules LEFT lower lobe new from prior CT. LEFT calcified granuloma. Stable moderate RIGHT pleural effusion. UPPER ABDOMEN: Nonacute. Small hiatal hernia. Mildly hypodense liver likely reflecting hepatic steatosis. MUSCULOSKELETAL: Nonacute. Osteopenia and mild degenerative change of the thoracic spine. IMPRESSION: 1. Large chronic RIGHT pulmonary embolism occlusive in RIGHT upper lobe. At least 50% stenosis RIGHT main pulmonary artery. 2. Ground-glass centrilobular nodules LEFT lower lobe may be infectious or inflammatory. Mildly worsening hilar lymphadenopathy. 3. Similar extensive fibrosis with bronchiectasis seen with chronic interstitial lung disease. 4. Chronic RIGHT moderate pleural effusion. 5. Acute findings discussed with and reconfirmed by Dr.Lacresha Fusilier on 05/23/2017 at 2:44 pm. Electronically Signed   By: Elon Alas M.D.   On: 05/23/2017 14:47   Ct Abdomen Pelvis W Contrast  Result Date: 05/04/2017 CLINICAL DATA:  Constipation, nausea EXAM: CT ABDOMEN AND PELVIS WITH CONTRAST TECHNIQUE: Multidetector CT imaging of the abdomen and pelvis was performed using the standard protocol following bolus administration of intravenous contrast. CONTRAST:  142mL ISOVUE-300 IOPAMIDOL (ISOVUE-300) INJECTION 61% COMPARISON:  03/24/2017, 08/28/2016, 01/13/2016 FINDINGS: Lower chest: Chronic rim enhancing pleural effusion at the right base. Right greater than left lung base fibrosis, similar pattern compared to prior. Volume loss on the right with shift of mediastinal contents to  the right. Partially visualized cardiac pacing leads. Borderline cardiomegaly. Moderate hiatal hernia. Hepatobiliary: No focal hepatic abnormality. Calcified gallstone. No biliary dilatation Pancreas: Unremarkable. No pancreatic ductal dilatation or surrounding inflammatory changes. Spleen: Normal in size without focal abnormality. Adrenals/Urinary Tract: Adrenal glands are within normal limits. Kidneys show no hydronephrosis. 9 mm posterior and 7 mm anterior mid right renal indeterminate lesions are again noted. Urinary bladder slightly lobulated in contour but without focal wall thickening. Stomach/Bowel: Stomach is nonenlarged. Fluid-filled distal small bowel loops. Diffuse liquid stools throughout the colon without wall thickening. Moderate amount of formed stools within the rectosigmoid colon. Vascular/Lymphatic: Moderate aortic atherosclerosis. Ectatic infrarenal abdominal aorta up to 2.7 cm. No significantly enlarged lymph nodes. Reproductive: Prostate calcifications. Slightly enlarged prostate gland. Fluid versus high-riding testicle in the right inguinal region. Other: Negative for free air or free fluid. Musculoskeletal: Degenerative changes of the spine. No acute or suspicious abnormality. IMPRESSION: 1. Diffuse liquid stools throughout the colon but without colon wall thickening. Moderate formed stools within the rectosigmoid colon, suggesting constipation. 2. Chronic rim enhancing and loculated right lung base pleural effusion. Right greater than left lung base pulmonary fibrosis without significant interval change 3. Gallstone 4. Diffuse aortic atherosclerosis with ectatic infrarenal abdominal aorta up to 2.7 cm. Ectatic abdominal aorta at risk for aneurysm development. Recommend followup by ultrasound in 5 years. This recommendation follows ACR consensus guidelines: White Paper of the ACR Incidental Findings  Committee II on Vascular Findings. J Am Coll Radiol 2013; 10:789-794. 5. Moderate hiatal  hernia Electronically Signed   By: Donavan Foil M.D.   On: 05/04/2017 21:01   Dg Chest Port 1 View  Result Date: 05/22/2017 CLINICAL DATA:  Shortness of breath. EXAM: PORTABLE CHEST 1 VIEW COMPARISON:  04/29/2017. FINDINGS: AICD in stable position. Stable cardiomegaly. Stable bilateral interstitial prominence consistent chronic interstitial lung disease. Stable right pleural thickening consistent scarring. Prior cardiac valve replacement. No acute bony abnormality. IMPRESSION: 1. Stable changes of chronic interstitial lung disease and right-sided pleural scarring. 2. AICD in stable position. Prior cardiac valve replacement. Stable cardiomegaly. Electronically Signed   By: Marcello Moores  Register   On: 05/22/2017 10:23     CBC Recent Labs  Lab 05/22/17 1003 05/23/17 0424 05/24/17 0446  WBC 7.8 7.6 6.4  HGB 14.3 13.5 13.3  HCT 42.7 39.8* 39.9*  PLT 183 165 162  MCV 90.7 91.0 90.9  MCH 30.4 30.9 30.3  MCHC 33.5 33.9 33.3  RDW 15.4* 15.2* 15.2*    Chemistries  Recent Labs  Lab 05/22/17 1003 05/23/17 0424 05/24/17 0446  NA 135 135 133*  K 4.6 4.5 4.9  CL 100* 100* 98*  CO2 28 28 28   GLUCOSE 120* 137* 119*  BUN 18 16 17   CREATININE 0.88 0.85 0.92  CALCIUM 9.0 9.1 9.0  AST 30  --   --   ALT 19  --   --   ALKPHOS 77  --   --   BILITOT 0.6  --   --    ------------------------------------------------------------------------------------------------------------------ estimated creatinine clearance is 66.1 mL/min (by C-G formula based on SCr of 0.92 mg/dL). ------------------------------------------------------------------------------------------------------------------ No results for input(s): HGBA1C in the last 72 hours. ------------------------------------------------------------------------------------------------------------------ No results for input(s): CHOL, HDL, LDLCALC, TRIG, CHOLHDL, LDLDIRECT in the last 72  hours. ------------------------------------------------------------------------------------------------------------------ Recent Labs    05/22/17 1630  TSH 2.678   ------------------------------------------------------------------------------------------------------------------ No results for input(s): VITAMINB12, FOLATE, FERRITIN, TIBC, IRON, RETICCTPCT in the last 72 hours.  Coagulation profile No results for input(s): INR, PROTIME in the last 168 hours.  No results for input(s): DDIMER in the last 72 hours.  Cardiac Enzymes No results for input(s): CKMB, TROPONINI, MYOGLOBIN in the last 168 hours.  Invalid input(s): CK ------------------------------------------------------------------------------------------------------------------ Invalid input(s): POCBNP    Assessment & Plan   Orlando Ingalls  is a 83 y.o. male with a known history of cardiomyopathy with EF of 45%, COPD on 2 L home oxygen, remote history of smoking, paroxysmal A. fib, hyperlipidemia, valvular heart disease, benign prostatic hypertrophy requiring self-catheterization twice a day presents to hospital secondary to worsening weakness and sleepiness.  1.  Shortness of breath Multifactorial including chronic pulmonary embolism Chronic underlying interstitial lung disease Due to persistent cough pulmonary seen the patient I will change his hydrocortisone to Solu-Medrol   2.  Acute cystitis-urine culture showing 70,000 MRSA bacteria unclear clinical significance Change antibiotics to IV doxycycline  3.  Paroxysmal atrial fibrillation-continue digoxin Rate well controlled.  Patient is status post AICD and pacemaker -Follows with outpatient cardiologist.  On aspirin for anticoagulation.  4.  GERD-Protonix  5.  BPH-with urinary retention, status post self catheterizations.  Will continue  6.  Chronic hypotension-continue hydrocortisone and midodrine  7  DVT prophylaxis-Lovenox          Code Status  Orders  (From admission, onward)        Start     Ordered   05/22/17 1610  Do not attempt resuscitation (DNR)  Continuous  Question Answer Comment  In the event of cardiac or respiratory ARREST Do not call a "code blue"   In the event of cardiac or respiratory ARREST Do not perform Intubation, CPR, defibrillation or ACLS   In the event of cardiac or respiratory ARREST Use medication by any route, position, wound care, and other measures to relive pain and suffering. May use oxygen, suction and manual treatment of airway obstruction as needed for comfort.      05/22/17 1609    Code Status History    Date Active Date Inactive Code Status Order ID Comments User Context   11/17/2016 1631 11/20/2016 1602 DNR [id number]  Vaughan Basta, MD Inpatient   08/19/2016 1315 08/30/2016 1728 DNR [id number]  Harrie Foreman, MD Inpatient   03/02/2016 2236 03/05/2016 2020 DNR [id number]  Mikael Spray, NP ED   01/09/2016 0658 01/14/2016 1939 DNR [id number]  Harrie Foreman, MD Inpatient   11/09/2015 0318 11/12/2015 1713 DNR [id number]  Harrie Foreman, MD Inpatient   06/14/2015 1639 06/18/2015 1513 DNR [id number]  Demetrios Loll, MD Inpatient    Advance Directive Documentation     Most Recent Value  Type of Advance Directive  Healthcare Power of Attorney, Living will, Out of facility DNR (pink MOST or yellow form)  Pre-existing out of facility DNR order (yellow form or pink MOST form)  Pink MOST form placed in chart (order not valid for inpatient use)  "MOST" Form in Place?  -           Consults  none  DVT Prophylaxis  Lovenox   Lab Results  Component Value Date   PLT 162 05/24/2017     Time Spent in minutes   34min  Greater than 50% of time spent in care coordination and counseling patient regarding the condition and plan of care.   Dustin Flock M.D on 05/24/2017 at 1:18 PM  Between 7am to 6pm - Pager - [phone]  After 6pm go to [email or web address] - [employer]  [employer]   Office  [phone]

## 2017-05-25 LAB — CBC
HEMATOCRIT: 38.4 % — AB (ref 40.0–52.0)
HEMOGLOBIN: 13.1 g/dL (ref 13.0–18.0)
MCH: 30.6 pg (ref 26.0–34.0)
MCHC: 34.2 g/dL (ref 32.0–36.0)
MCV: 89.6 fL (ref 80.0–100.0)
Platelets: 160 10*3/uL (ref 150–440)
RBC: 4.29 MIL/uL — ABNORMAL LOW (ref 4.40–5.90)
RDW: 15.2 % — ABNORMAL HIGH (ref 11.5–14.5)
WBC: 16.1 10*3/uL — AB (ref 3.8–10.6)

## 2017-05-25 LAB — BASIC METABOLIC PANEL
ANION GAP: 7 (ref 5–15)
BUN: 28 mg/dL — ABNORMAL HIGH (ref 6–20)
CHLORIDE: 101 mmol/L (ref 101–111)
CO2: 25 mmol/L (ref 22–32)
Calcium: 8.7 mg/dL — ABNORMAL LOW (ref 8.9–10.3)
Creatinine, Ser: 1.04 mg/dL (ref 0.61–1.24)
GFR calc non Af Amer: >60 mL/min (ref >60)
GLUCOSE: 181 mg/dL — AB (ref 65–99)
Potassium: 5.2 mmol/L — ABNORMAL HIGH (ref 3.5–5.1)
Sodium: 133 mmol/L — ABNORMAL LOW (ref 135–145)

## 2017-05-25 MED ORDER — DOCUSATE SODIUM 100 MG PO CAPS
100.0000 mg | ORAL_CAPSULE | Freq: Two times a day (BID) | ORAL | Status: DC
  Administered 2017-05-25 – 2017-05-29 (×7): 100 mg via ORAL
  Filled 2017-05-25 (×8): qty 1

## 2017-05-25 NOTE — Care Management Important Message (Signed)
Important Message  Patient Details  Name: Russell Howard MRN: [id number] Date of Birth: 10/11/1934   Medicare Important Message Given:  Yes    Beverly Sessions, RN 05/25/2017, 5:36 PM

## 2017-05-25 NOTE — Clinical Social Work Note (Addendum)
CSW spoke with patient and he does not want to go to SNF for short term rehab.  Patient states he has his daughter in law who is able to help take care of him.  Patient is agreeable to home health though and asked for case manager to call his daughter in law [name] at [phone].  CSW updated case manager, CSW to sign off please reconsult if other social work needs arise.  Jones Broom. [place], MSW, [place]  05/25/2017 12:55 PM

## 2017-05-25 NOTE — Progress Notes (Signed)
PT Cancellation Note  Patient Details Name: Jame Morrell MRN: [id number] DOB: April 15, 1934   Cancelled Treatment:    Reason Eval/Treat Not Completed: Medical issues which prohibited therapy; Pt's Ka 5.2 and trending up falling outside guidelines for participation with PT services.  Will attempt to see pt at a future date and time as medically appropriate.   Heloise Beecham Zarek Relph PT, DPT 05/25/17, 1:21 PM

## 2017-05-25 NOTE — Progress Notes (Signed)
[place] at [facility]                                                                                                                                                                                  Patient Demographics   Russell Howard, is a 83 y.o. male, DOB - 1934-04-18, RDE:[id number]  Admit date - 05/22/2017   Admitting Physician Gladstone Lighter, MD  Outpatient Primary MD for the patient is [name], MD   LOS - 3  Subjective: Pt breathing improved , coughing less    Review of Systems:   CONSTITUTIONAL: No documented fever. No fatigue, weakness. No weight gain, no weight loss.  EYES: No blurry or double vision.  ENT: No tinnitus. No postnasal drip. No redness of the oropharynx.  RESPIRATORY: +cough, no wheeze, no hemoptysis. + dyspnea.  CARDIOVASCULAR: No chest pain. No orthopnea. No palpitations. No syncope.  GASTROINTESTINAL: No nausea, no vomiting or diarrhea. No abdominal pain. No melena or hematochezia.  GENITOURINARY: No dysuria or hematuria.  ENDOCRINE: No polyuria or nocturia. No heat or cold intolerance.  HEMATOLOGY: No anemia. No bruising. No bleeding.  INTEGUMENTARY: No rashes. No lesions.  MUSCULOSKELETAL: No arthritis. No swelling. No gout.  NEUROLOGIC: No numbness, tingling, or ataxia. No seizure-type activity.  PSYCHIATRIC: No anxiety. No insomnia. No ADD.    Vitals:   Vitals:   05/24/17 1940 05/25/17 0446 05/25/17 0755 05/25/17 1129  BP: 133/60 (!) 90/56 (!) 96/57   Pulse: 96 70 74   Resp: 18 18 16    Temp: 98.1 F (36.7 C) 98.2 F (36.8 C) 98 F (36.7 C)   TempSrc: Oral     SpO2: 98% 100% 100% 97%  Weight:      Height:        Wt Readings from Last 3 Encounters:  05/22/17 81.6 kg (179 lb 14.3 oz)  05/04/17 77.1 kg (170 lb)  04/29/17 77.1 kg (170 lb)     Intake/Output Summary (Last 24 hours) at 05/25/2017 1320 Last data filed at 05/25/2017 1129 Gross per 24 hour  Intake 740 ml  Output 675 ml  Net 65 ml     Physical Exam:   GENERAL: Pleasant-appearing in no apparent distress.  HEAD, EYES, EARS, NOSE AND THROAT: Atraumatic, normocephalic. Extraocular muscles are intact. Pupils equal and reactive to light. Sclerae anicteric. No conjunctival injection. No oro-pharyngeal erythema.  NECK: Supple. There is no jugular venous distention. No bruits, no lymphadenopathy, no thyromegaly.  HEART: Regular rate and rhythm,. No murmurs, no rubs, no clicks.  LUNGS: Clear to auscultation bilaterally. No rales or rhonchi. No wheezes.  ABDOMEN: Soft, flat, nontender, nondistended. Has good bowel sounds. No hepatosplenomegaly  appreciated.  EXTREMITIES: No evidence of any cyanosis, clubbing, or peripheral edema.  +2 pedal and radial pulses bilaterally.  NEUROLOGIC: The patient is alert, awake, and oriented x3 with no focal motor or sensory deficits appreciated bilaterally.  SKIN: Moist and warm with no rashes appreciated.  Psych: Not anxious, depressed LN: No inguinal LN enlargement    Antibiotics   Anti-infectives (From admission, onward)   Start     Dose/Rate Route Frequency Ordered Stop   05/24/17 1600  doxycycline (VIBRAMYCIN) 100 mg in sodium chloride 0.9 % 250 mL IVPB     100 mg 125 mL/hr over 120 Minutes Intravenous Every 12 hours 05/24/17 1323     05/23/17 1000  levofloxacin (LEVAQUIN) IVPB 750 mg  Status:  Discontinued     750 mg 100 mL/hr over 90 Minutes Intravenous Every 24 hours 05/22/17 1338 05/22/17 1504   05/23/17 1000  levofloxacin (LEVAQUIN) IVPB 750 mg  Status:  Discontinued     750 mg 100 mL/hr over 90 Minutes Intravenous Every 24 hours 05/22/17 1504 05/24/17 1321   05/22/17 1800  aztreonam (AZACTAM) 2 g in sodium chloride 0.9 % 100 mL IVPB  Status:  Discontinued     2 g 200 mL/hr over 30 Minutes Intravenous Every 8 hours 05/22/17 1338 05/22/17 1504   05/22/17 1800  vancomycin (VANCOCIN) IVPB 1000 mg/200 mL premix  Status:  Discontinued     1,000 mg 200 mL/hr over 60 Minutes  Intravenous Every 12 hours 05/22/17 1338 05/22/17 1504   05/22/17 1000  levofloxacin (LEVAQUIN) IVPB 750 mg     750 mg 100 mL/hr over 90 Minutes Intravenous  Once 05/22/17 0949 05/22/17 1317   05/22/17 1000  aztreonam (AZACTAM) 2 g in sodium chloride 0.9 % 100 mL IVPB     2 g 200 mL/hr over 30 Minutes Intravenous  Once 05/22/17 0949 05/22/17 1135   05/22/17 1000  vancomycin (VANCOCIN) IVPB 1000 mg/200 mL premix     1,000 mg 200 mL/hr over 60 Minutes Intravenous  Once 05/22/17 0949 05/22/17 1317      Medications   Scheduled Meds: . acidophilus  1 capsule Oral QPM  . apixaban  10 mg Oral BID   Followed by  . [START ON 05/31/2017] apixaban  5 mg Oral BID  . aspirin EC  81 mg Oral Daily  . benzonatate  200 mg Oral TID  . budesonide (PULMICORT) nebulizer solution  0.25 mg Nebulization BID  . cholecalciferol  5,000 Units Oral Daily  . digoxin  0.125 mg Oral Daily  . famotidine  20 mg Oral Daily  . fluticasone  2 spray Each Nare Daily  . gabapentin  600 mg Oral BID  . guaiFENesin  600 mg Oral BID  . ipratropium-albuterol  3 mL Nebulization Q6H  . levothyroxine  100 mcg Oral QAC breakfast  . magnesium oxide  400 mg Oral QPM  . methenamine  1,000 mg Oral BID WC  . methylPREDNISolone (SOLU-MEDROL) injection  60 mg Intravenous Q6H  . midodrine  10 mg Oral TID  . multivitamin with minerals  1 tablet Oral Daily  . pantoprazole  40 mg Oral QAC breakfast  . pyridostigmine  60 mg Oral BID  . simvastatin  20 mg Oral QPM  . vitamin B-12  500 mcg Oral Daily  . vitamin C  500 mg Oral BID  . vitamin E  400 Units Oral QPM   Continuous Infusions: . doxycycline (VIBRAMYCIN) IV Stopped (05/25/17 0603)   PRN Meds:.acetaminophen **OR** acetaminophen, guaiFENesin-codeine,  ipratropium-albuterol, ketorolac, ondansetron **OR** ondansetron (ZOFRAN) IV, promethazine   Data Review:   Micro Results Recent Results (from the past 240 hour(s))  Blood Culture (routine x 2)     Status: None (Preliminary  result)   Collection Time: 05/22/17 10:03 AM  Result Value Ref Range Status   Specimen Description BLOOD RIGHT FA  Final   Special Requests   Final    BOTTLES DRAWN AEROBIC AND ANAEROBIC Blood Culture adequate volume   Culture   Final    NO GROWTH 3 DAYS Performed at [facility], [address] [place], [place] [address]    Report Status PENDING  Incomplete  Blood Culture (routine x 2)     Status: None (Preliminary result)   Collection Time: 05/22/17 10:04 AM  Result Value Ref Range Status   Specimen Description BLOOD RIGHT [facility]  Final   Special Requests   Final    BOTTLES DRAWN AEROBIC AND ANAEROBIC Blood Culture adequate volume   Culture   Final    NO GROWTH 3 DAYS Performed at [facility], [address], [place], [place] [address]    Report Status PENDING  Incomplete  Urine Culture     Status: Abnormal   Collection Time: 05/22/17  3:11 PM  Result Value Ref Range Status   Specimen Description   Final    URINE, RANDOM Performed at [facility], [address], [place], [place] [address]    Special Requests   Final    NONE Performed at [facility], [address] [place], [place] [address]    Culture (A)  Final    70,000 COLONIES/mL METHICILLIN RESISTANT STAPHYLOCOCCUS AUREUS   Report Status 05/24/2017 FINAL  Final   Organism ID, Bacteria METHICILLIN RESISTANT STAPHYLOCOCCUS AUREUS (A)  Final      Susceptibility   Methicillin resistant staphylococcus aureus - MIC*    CIPROFLOXACIN >=8 RESISTANT Resistant     GENTAMICIN <=0.5 SENSITIVE Sensitive     NITROFURANTOIN <=16 SENSITIVE Sensitive     OXACILLIN >=4 RESISTANT Resistant     TETRACYCLINE <=1 SENSITIVE Sensitive     VANCOMYCIN <=0.5 SENSITIVE Sensitive     TRIMETH/SULFA <=10 SENSITIVE Sensitive     CLINDAMYCIN <=0.25 SENSITIVE Sensitive     RIFAMPIN <=0.5 SENSITIVE Sensitive     Inducible Clindamycin NEGATIVE Sensitive     * 70,000 COLONIES/mL METHICILLIN RESISTANT  STAPHYLOCOCCUS AUREUS    Radiology Reports Dg Chest 2 View  Result Date: 04/29/2017 CLINICAL DATA:  Cough and shortness of Breath EXAM: CHEST - 2 VIEW COMPARISON:  03/27/2017 FINDINGS: Cardiac shadow is stable. Defibrillator is again noted. Postsurgical changes are seen. The left lung is clear with mild interstitial changes. Right lung again demonstrates diffuse chronic pleuroparenchymal changes similar to that seen on the prior exam. No acute abnormality is noted. IMPRESSION: Chronic changes without acute abnormality. Electronically Signed   By: Inez Catalina M.D.   On: 04/29/2017 15:53   Ct Head Wo Contrast  Result Date: 05/22/2017 CLINICAL DATA:  Increasing weakness. Altered level of consciousness. EXAM: CT HEAD WITHOUT CONTRAST TECHNIQUE: Contiguous axial images were obtained from the base of the skull through the vertex without intravenous contrast. COMPARISON:  02/25/2017 FINDINGS: Brain: There is atrophy and chronic small vessel disease changes. No acute intracranial abnormality. Specifically, no hemorrhage, hydrocephalus, mass lesion, acute infarction, or significant intracranial injury. Vascular: No hyperdense vessel or unexpected calcification. Skull: No acute calvarial abnormality. Sinuses/Orbits: Mucosal thickening throughout the paranasal sinuses. Mastoid air cells are clear.  Orbital soft tissues unremarkable. Other: None IMPRESSION: No acute intracranial abnormality. Atrophy, chronic microvascular disease. Electronically Signed   By: Rolm Baptise M.D.   On: 05/22/2017 11:07   Ct Chest W Contrast  Result Date: 05/23/2017 CLINICAL DATA:  Cough and congestion worsening for few days. Shortness of breath. History of COPD, cardiomyopathy, lung cancer. EXAM: CT CHEST WITH CONTRAST TECHNIQUE: Multidetector CT imaging of the chest was performed during intravenous contrast administration. CONTRAST:  59mL ISOVUE-300 IOPAMIDOL (ISOVUE-300) INJECTION 61% COMPARISON:  Chest radiograph April 19th 2019  and CT chest November 10, 2015 and CT chest September 07, 2015 FINDINGS: CARDIOVASCULAR: The heart is mildly enlarged and unchanged. No pericardial effusion. Severe coronary artery calcifications. Thoracic aorta is normal course and caliber with moderate calcific atherosclerosis. large mural based filling defect RIGHT main pulmonary artery narrowing the lumen by at least 50%. Propagation of soft tissue into RIGHT upper lobe pulmonary artery with partial occlusion. MEDIASTINUM/NODES: Similar mediastinal, worsening hilar lymphadenopathy. LEFT AICD. LUNGS/PLEURA: Similar RIGHT lung bronchiectasis, with bilateral subpleural fibrosis. Faint ground-glass nodules LEFT lower lobe new from prior CT. LEFT calcified granuloma. Stable moderate RIGHT pleural effusion. UPPER ABDOMEN: Nonacute. Small hiatal hernia. Mildly hypodense liver likely reflecting hepatic steatosis. MUSCULOSKELETAL: Nonacute. Osteopenia and mild degenerative change of the thoracic spine. IMPRESSION: 1. Large chronic RIGHT pulmonary embolism occlusive in RIGHT upper lobe. At least 50% stenosis RIGHT main pulmonary artery. 2. Ground-glass centrilobular nodules LEFT lower lobe may be infectious or inflammatory. Mildly worsening hilar lymphadenopathy. 3. Similar extensive fibrosis with bronchiectasis seen with chronic interstitial lung disease. 4. Chronic RIGHT moderate pleural effusion. 5. Acute findings discussed with and reconfirmed by Dr.Ilah Boule on 05/23/2017 at 2:44 pm. Electronically Signed   By: Elon Alas M.D.   On: 05/23/2017 14:47   Ct Abdomen Pelvis W Contrast  Result Date: 05/04/2017 CLINICAL DATA:  Constipation, nausea EXAM: CT ABDOMEN AND PELVIS WITH CONTRAST TECHNIQUE: Multidetector CT imaging of the abdomen and pelvis was performed using the standard protocol following bolus administration of intravenous contrast. CONTRAST:  157mL ISOVUE-300 IOPAMIDOL (ISOVUE-300) INJECTION 61% COMPARISON:  03/24/2017, 08/28/2016, 01/13/2016 FINDINGS:  Lower chest: Chronic rim enhancing pleural effusion at the right base. Right greater than left lung base fibrosis, similar pattern compared to prior. Volume loss on the right with shift of mediastinal contents to the right. Partially visualized cardiac pacing leads. Borderline cardiomegaly. Moderate hiatal hernia. Hepatobiliary: No focal hepatic abnormality. Calcified gallstone. No biliary dilatation Pancreas: Unremarkable. No pancreatic ductal dilatation or surrounding inflammatory changes. Spleen: Normal in size without focal abnormality. Adrenals/Urinary Tract: Adrenal glands are within normal limits. Kidneys show no hydronephrosis. 9 mm posterior and 7 mm anterior mid right renal indeterminate lesions are again noted. Urinary bladder slightly lobulated in contour but without focal wall thickening. Stomach/Bowel: Stomach is nonenlarged. Fluid-filled distal small bowel loops. Diffuse liquid stools throughout the colon without wall thickening. Moderate amount of formed stools within the rectosigmoid colon. Vascular/Lymphatic: Moderate aortic atherosclerosis. Ectatic infrarenal abdominal aorta up to 2.7 cm. No significantly enlarged lymph nodes. Reproductive: Prostate calcifications. Slightly enlarged prostate gland. Fluid versus high-riding testicle in the right inguinal region. Other: Negative for free air or free fluid. Musculoskeletal: Degenerative changes of the spine. No acute or suspicious abnormality. IMPRESSION: 1. Diffuse liquid stools throughout the colon but without colon wall thickening. Moderate formed stools within the rectosigmoid colon, suggesting constipation. 2. Chronic rim enhancing and loculated right lung base pleural effusion. Right greater than left lung base pulmonary fibrosis without significant interval change 3. Gallstone 4. Diffuse  aortic atherosclerosis with ectatic infrarenal abdominal aorta up to 2.7 cm. Ectatic abdominal aorta at risk for aneurysm development. Recommend followup by  ultrasound in 5 years. This recommendation follows ACR consensus guidelines: White Paper of the ACR Incidental Findings Committee II on Vascular Findings. J Am Coll Radiol 2013; 10:789-794. 5. Moderate hiatal hernia Electronically Signed   By: Donavan Foil M.D.   On: 05/04/2017 21:01   Dg Chest Port 1 View  Result Date: 05/22/2017 CLINICAL DATA:  Shortness of breath. EXAM: PORTABLE CHEST 1 VIEW COMPARISON:  04/29/2017. FINDINGS: AICD in stable position. Stable cardiomegaly. Stable bilateral interstitial prominence consistent chronic interstitial lung disease. Stable right pleural thickening consistent scarring. Prior cardiac valve replacement. No acute bony abnormality. IMPRESSION: 1. Stable changes of chronic interstitial lung disease and right-sided pleural scarring. 2. AICD in stable position. Prior cardiac valve replacement. Stable cardiomegaly. Electronically Signed   By: Marcello Moores  Register   On: 05/22/2017 10:23     CBC Recent Labs  Lab 05/22/17 1003 05/23/17 0424 05/24/17 0446 05/25/17 0447  WBC 7.8 7.6 6.4 16.1*  HGB 14.3 13.5 13.3 13.1  HCT 42.7 39.8* 39.9* 38.4*  PLT 183 165 162 160  MCV 90.7 91.0 90.9 89.6  MCH 30.4 30.9 30.3 30.6  MCHC 33.5 33.9 33.3 34.2  RDW 15.4* 15.2* 15.2* 15.2*    Chemistries  Recent Labs  Lab 05/22/17 1003 05/23/17 0424 05/24/17 0446 05/25/17 0447  NA 135 135 133* 133*  K 4.6 4.5 4.9 5.2*  CL 100* 100* 98* 101  CO2 28 28 28 25   GLUCOSE 120* 137* 119* 181*  BUN 18 16 17  28*  CREATININE 0.88 0.85 0.92 1.04  CALCIUM 9.0 9.1 9.0 8.7*  AST 30  --   --   --   ALT 19  --   --   --   ALKPHOS 77  --   --   --   BILITOT 0.6  --   --   --    ------------------------------------------------------------------------------------------------------------------ estimated creatinine clearance is 58.5 mL/min (by C-G formula based on SCr of 1.04  mg/dL). ------------------------------------------------------------------------------------------------------------------ No results for input(s): HGBA1C in the last 72 hours. ------------------------------------------------------------------------------------------------------------------ No results for input(s): CHOL, HDL, LDLCALC, TRIG, CHOLHDL, LDLDIRECT in the last 72 hours. ------------------------------------------------------------------------------------------------------------------ Recent Labs    05/22/17 1630  TSH 2.678   ------------------------------------------------------------------------------------------------------------------ No results for input(s): VITAMINB12, FOLATE, FERRITIN, TIBC, IRON, RETICCTPCT in the last 72 hours.  Coagulation profile No results for input(s): INR, PROTIME in the last 168 hours.  No results for input(s): DDIMER in the last 72 hours.  Cardiac Enzymes No results for input(s): CKMB, TROPONINI, MYOGLOBIN in the last 168 hours.  Invalid input(s): CK ------------------------------------------------------------------------------------------------------------------ Invalid input(s): POCBNP    Assessment & Plan   Russell Howard  is a 83 y.o. male with a known history of cardiomyopathy with EF of 45%, COPD on 2 L home oxygen, remote history of smoking, paroxysmal A. fib, hyperlipidemia, valvular heart disease, benign prostatic hypertrophy requiring self-catheterization twice a day presents to hospital secondary to worsening weakness and sleepiness.  1.  Shortness of breath Multifactorial including chronic pulmonary embolism Chronic underlying interstitial lung disease Due to persistent cough pulmonary seen the patient Continue IV Solu-Medrol Patient slow to improvement  2.  Acute cystitis-urine culture showing 70,000 MRSA bacteria unclear clinical significance Continue IV doxycycline  3.  Paroxysmal atrial fibrillation-continue  digoxin Rate well controlled.  Patient is status post AICD and pacemaker -Follows with outpatient cardiologist.  On aspirin for anticoagulation.  4.  GERD-Protonix  5.  BPH-with urinary retention, status post self catheterizations.  Will continue  6.  Chronic hypotension-continue hydrocortisone and midodrine  7  DVT prophylaxis-Lovenox          Code Status Orders  (From admission, onward)        Start     Ordered   05/22/17 1610  Do not attempt resuscitation (DNR)  Continuous    Question Answer Comment  In the event of cardiac or respiratory ARREST Do not call a "code blue"   In the event of cardiac or respiratory ARREST Do not perform Intubation, CPR, defibrillation or ACLS   In the event of cardiac or respiratory ARREST Use medication by any route, position, wound care, and other measures to relive pain and suffering. May use oxygen, suction and manual treatment of airway obstruction as needed for comfort.      05/22/17 1609    Code Status History    Date Active Date Inactive Code Status Order ID Comments User Context   11/17/2016 1631 11/20/2016 1602 DNR [id number]  Vaughan Basta, MD Inpatient   08/19/2016 1315 08/30/2016 1728 DNR [id number]  Harrie Foreman, MD Inpatient   03/02/2016 2236 03/05/2016 2020 DNR [id number]  Mikael Spray, NP ED   01/09/2016 0658 01/14/2016 1939 DNR [id number]  Harrie Foreman, MD Inpatient   11/09/2015 0318 11/12/2015 1713 DNR [id number]  Harrie Foreman, MD Inpatient   06/14/2015 1639 06/18/2015 1513 DNR [id number]  Demetrios Loll, MD Inpatient    Advance Directive Documentation     Most Recent Value  Type of Advance Directive  Healthcare Power of Attorney, Living will, Out of facility DNR (pink MOST or yellow form)  Pre-existing out of facility DNR order (yellow form or pink MOST form)  Pink MOST form placed in chart (order not valid for inpatient use)  "MOST" Form in Place?  -           Consults  none  DVT  Prophylaxis  Lovenox   Lab Results  Component Value Date   PLT 160 05/25/2017     Time Spent in minutes   49min  Greater than 50% of time spent in care coordination and counseling patient regarding the condition and plan of care.   Dustin Flock M.D on 05/25/2017 at 1:20 PM  Between 7am to 6pm - Pager - [phone]  After 6pm go to [email or web address] - Proofreader  [employer]   Office  [phone]

## 2017-05-26 ENCOUNTER — Inpatient Hospital Stay: Payer: Medicare Other

## 2017-05-26 LAB — TROPONIN I
TROPONIN I: 0.41 ng/mL — AB (ref <0.03)
TROPONIN I: 0.42 ng/mL — AB (ref <0.03)
Troponin I: 0.14 ng/mL (ref <0.03)

## 2017-05-26 MED ORDER — MORPHINE SULFATE (PF) 2 MG/ML IV SOLN: 2.0000 mg | INTRAVENOUS | Status: DC | PRN

## 2017-05-26 MED ORDER — DOXYCYCLINE HYCLATE 100 MG PO TABS
100.0000 mg | ORAL_TABLET | Freq: Two times a day (BID) | ORAL | Status: AC
  Administered 2017-05-26 – 2017-05-28 (×5): 100 mg via ORAL
  Filled 2017-05-26 (×5): qty 1

## 2017-05-26 MED ORDER — METHYLPREDNISOLONE SODIUM SUCC 125 MG IJ SOLR
60.0000 mg | Freq: Two times a day (BID) | INTRAMUSCULAR | Status: DC
  Administered 2017-05-26 – 2017-05-27 (×2): 60 mg via INTRAVENOUS
  Filled 2017-05-26 (×2): qty 2

## 2017-05-26 NOTE — Progress Notes (Signed)
PT Cancellation Note  Patient Details Name: Russell Howard MRN: [id number] DOB: [birth year]   Cancelled Treatment:    Reason Eval/Treat Not Completed: Medical issues which prohibited therapy; Per chart review, pt recently reporting left-sided chest pain with elevated troponin (new as of admission). Orders for trending troponin and cardiology consult pending. [place] LUE reveals no DVT. Additionally, last K+ taken 4/22 and noted to be elevated above threshold for appropriateness for therapy (5.2). Will hold PT treatment this date and re-attempt at later date/time as pt is medically appropriate.     Linus Salmons PT, DPT 05/26/17, 1:48 PM

## 2017-05-26 NOTE — Progress Notes (Signed)
[place] at [facility]                                                                                                                                                                                  Patient Demographics   Russell Howard, is a 83 y.o. male, DOB - [birth year], RKY:[id number]  Admit date - 05/22/2017   Admitting Physician Gladstone Lighter, MD  Outpatient Primary MD for the patient is Sparks, Leonie Douglas, MD   LOS - 4  Subjective: Patient is breathing is improved now he started complaining of pain in the left arm EKG was checked does not show any abnormality however troponin is slightly elevated    Review of Systems:   CONSTITUTIONAL: No documented fever. No fatigue, weakness. No weight gain, no weight loss.  EYES: No blurry or double vision.  ENT: No tinnitus. No postnasal drip. No redness of the oropharynx.  RESPIRATORY: +cough, no wheeze, no hemoptysis. + dyspnea.  CARDIOVASCULAR: Positive chest pain. No orthopnea. No palpitations. No syncope.  GASTROINTESTINAL: No nausea, no vomiting or diarrhea. No abdominal pain. No melena or hematochezia.  GENITOURINARY: No dysuria or hematuria.  ENDOCRINE: No polyuria or nocturia. No heat or cold intolerance.  HEMATOLOGY: No anemia. No bruising. No bleeding.  INTEGUMENTARY: No rashes. No lesions.  MUSCULOSKELETAL: No arthritis. No swelling. No gout.  NEUROLOGIC: No numbness, tingling, or ataxia. No seizure-type activity.  PSYCHIATRIC: No anxiety. No insomnia. No ADD.    Vitals:   Vitals:   05/25/17 1701 05/25/17 1918 05/26/17 0351 05/26/17 0714  BP: (!) 141/76 (!) 167/93 (!) 98/55 126/63  Pulse: 98 (!) 113 73 79  Resp: 14 18 18 18   Temp: 97.7 F (36.5 C) 98.5 F (36.9 C) 98.3 F (36.8 C)   TempSrc: Oral Oral    SpO2: 96% 100% 98% 96%  Weight:      Height:        Wt Readings from Last 3 Encounters:  05/22/17 81.6 kg (179 lb 14.3 oz)  05/04/17 77.1 kg (170 lb)  04/29/17 77.1 kg (170 lb)      Intake/Output Summary (Last 24 hours) at 05/26/2017 1312 Last data filed at 05/26/2017 1215 Gross per 24 hour  Intake 250 ml  Output 1500 ml  Net -1250 ml    Physical Exam:   GENERAL: Pleasant-appearing in no apparent distress.  HEAD, EYES, EARS, NOSE AND THROAT: Atraumatic, normocephalic. Extraocular muscles are intact. Pupils equal and reactive to light. Sclerae anicteric. No conjunctival injection. No oro-pharyngeal erythema.  NECK: Supple. There is no jugular venous distention. No bruits, no lymphadenopathy, no thyromegaly.  HEART: Regular rate and rhythm,. No murmurs, no rubs, no clicks.  LUNGS: Clear to auscultation bilaterally. No rales or rhonchi. No wheezes.  ABDOMEN: Soft, flat, nontender, nondistended. Has good bowel sounds. No hepatosplenomegaly appreciated.  EXTREMITIES: No evidence of any cyanosis, clubbing, or peripheral edema.  +2 pedal and radial pulses bilaterally.  NEUROLOGIC: The patient is alert, awake, and oriented x3 with no focal motor or sensory deficits appreciated bilaterally.  SKIN: Moist and warm with no rashes appreciated.  Psych: Not anxious, depressed LN: No inguinal LN enlargement    Antibiotics   Anti-infectives (From admission, onward)   Start     Dose/Rate Route Frequency Ordered Stop   05/24/17 1600  doxycycline (VIBRAMYCIN) 100 mg in sodium chloride 0.9 % 250 mL IVPB     100 mg 125 mL/hr over 120 Minutes Intravenous Every 12 hours 05/24/17 1323     05/23/17 1000  levofloxacin (LEVAQUIN) IVPB 750 mg  Status:  Discontinued     750 mg 100 mL/hr over 90 Minutes Intravenous Every 24 hours 05/22/17 1338 05/22/17 1504   05/23/17 1000  levofloxacin (LEVAQUIN) IVPB 750 mg  Status:  Discontinued     750 mg 100 mL/hr over 90 Minutes Intravenous Every 24 hours 05/22/17 1504 05/24/17 1321   05/22/17 1800  aztreonam (AZACTAM) 2 g in sodium chloride 0.9 % 100 mL IVPB  Status:  Discontinued     2 g 200 mL/hr over 30 Minutes Intravenous Every 8 hours  05/22/17 1338 05/22/17 1504   05/22/17 1800  vancomycin (VANCOCIN) IVPB 1000 mg/200 mL premix  Status:  Discontinued     1,000 mg 200 mL/hr over 60 Minutes Intravenous Every 12 hours 05/22/17 1338 05/22/17 1504   05/22/17 1000  levofloxacin (LEVAQUIN) IVPB 750 mg     750 mg 100 mL/hr over 90 Minutes Intravenous  Once 05/22/17 0949 05/22/17 1317   05/22/17 1000  aztreonam (AZACTAM) 2 g in sodium chloride 0.9 % 100 mL IVPB     2 g 200 mL/hr over 30 Minutes Intravenous  Once 05/22/17 0949 05/22/17 1135   05/22/17 1000  vancomycin (VANCOCIN) IVPB 1000 mg/200 mL premix     1,000 mg 200 mL/hr over 60 Minutes Intravenous  Once 05/22/17 0949 05/22/17 1317      Medications   Scheduled Meds: . acidophilus  1 capsule Oral QPM  . apixaban  10 mg Oral BID   Followed by  . [START ON 05/31/2017] apixaban  5 mg Oral BID  . aspirin EC  81 mg Oral Daily  . benzonatate  200 mg Oral TID  . budesonide (PULMICORT) nebulizer solution  0.25 mg Nebulization BID  . cholecalciferol  5,000 Units Oral Daily  . digoxin  0.125 mg Oral Daily  . docusate sodium  100 mg Oral BID  . famotidine  20 mg Oral Daily  . fluticasone  2 spray Each Nare Daily  . gabapentin  600 mg Oral BID  . guaiFENesin  600 mg Oral BID  . ipratropium-albuterol  3 mL Nebulization Q6H  . levothyroxine  100 mcg Oral QAC breakfast  . magnesium oxide  400 mg Oral QPM  . methenamine  1,000 mg Oral BID WC  . methylPREDNISolone (SOLU-MEDROL) injection  60 mg Intravenous Q6H  . midodrine  10 mg Oral TID  . multivitamin with minerals  1 tablet Oral Daily  . pantoprazole  40 mg Oral QAC breakfast  . pyridostigmine  60 mg Oral BID  . simvastatin  20 mg Oral QPM  . vitamin B-12  500 mcg Oral Daily  . vitamin C  500 mg Oral BID  . vitamin E  400 Units Oral QPM   Continuous Infusions: . doxycycline (VIBRAMYCIN) IV Stopped (05/26/17 0705)   PRN Meds:.acetaminophen **OR** acetaminophen, guaiFENesin-codeine, ipratropium-albuterol, ketorolac,  morphine injection, ondansetron **OR** ondansetron (ZOFRAN) IV, promethazine   Data Review:   Micro Results Recent Results (from the past 240 hour(s))  Blood Culture (routine x 2)     Status: None (Preliminary result)   Collection Time: 05/22/17 10:03 AM  Result Value Ref Range Status   Specimen Description BLOOD RIGHT FA  Final   Special Requests   Final    BOTTLES DRAWN AEROBIC AND ANAEROBIC Blood Culture adequate volume   Culture   Final    NO GROWTH 4 DAYS Performed at [facility], [address], [place], [place] [address]    Report Status PENDING  Incomplete  Blood Culture (routine x 2)     Status: None (Preliminary result)   Collection Time: 05/22/17 10:04 AM  Result Value Ref Range Status   Specimen Description BLOOD RIGHT [facility]  Final   Special Requests   Final    BOTTLES DRAWN AEROBIC AND ANAEROBIC Blood Culture adequate volume   Culture   Final    NO GROWTH 4 DAYS Performed at [facility], [address], [place], [place] [address]    Report Status PENDING  Incomplete  Urine Culture     Status: Abnormal   Collection Time: 05/22/17  3:11 PM  Result Value Ref Range Status   Specimen Description   Final    URINE, RANDOM Performed at [facility], [address], [place], [place] [address]    Special Requests   Final    NONE Performed at [facility], [address] [place], [place] [address]    Culture (A)  Final    70,000 COLONIES/mL METHICILLIN RESISTANT STAPHYLOCOCCUS AUREUS   Report Status 05/24/2017 FINAL  Final   Organism ID, Bacteria METHICILLIN RESISTANT STAPHYLOCOCCUS AUREUS (A)  Final      Susceptibility   Methicillin resistant staphylococcus aureus - MIC*    CIPROFLOXACIN >=8 RESISTANT Resistant     GENTAMICIN <=0.5 SENSITIVE Sensitive     NITROFURANTOIN <=16 SENSITIVE Sensitive     OXACILLIN >=4 RESISTANT Resistant     TETRACYCLINE <=1 SENSITIVE Sensitive     VANCOMYCIN <=0.5 SENSITIVE Sensitive      TRIMETH/SULFA <=10 SENSITIVE Sensitive     CLINDAMYCIN <=0.25 SENSITIVE Sensitive     RIFAMPIN <=0.5 SENSITIVE Sensitive     Inducible Clindamycin NEGATIVE Sensitive     * 70,000 COLONIES/mL METHICILLIN RESISTANT STAPHYLOCOCCUS AUREUS    Radiology Reports Dg Chest 2 View  Result Date: 04/29/2017 CLINICAL DATA:  Cough and shortness of Breath EXAM: CHEST - 2 VIEW COMPARISON:  03/27/2017 FINDINGS: Cardiac shadow is stable. Defibrillator is again noted. Postsurgical changes are seen. The left lung is clear with mild interstitial changes. Right lung again demonstrates diffuse chronic pleuroparenchymal changes similar to that seen on the prior exam. No acute abnormality is noted. IMPRESSION: Chronic changes without acute abnormality. Electronically Signed   By: Inez Catalina M.D.   On: 04/29/2017 15:53   Ct Head Wo Contrast  Result Date: 05/22/2017 CLINICAL DATA:  Increasing weakness. Altered level of consciousness. EXAM: CT HEAD WITHOUT CONTRAST TECHNIQUE: Contiguous axial images were obtained from the base of the skull through the vertex without intravenous contrast. COMPARISON:  02/25/2017 FINDINGS: Brain: There is atrophy and chronic small vessel disease changes. No acute intracranial abnormality. Specifically, no hemorrhage,  hydrocephalus, mass lesion, acute infarction, or significant intracranial injury. Vascular: No hyperdense vessel or unexpected calcification. Skull: No acute calvarial abnormality. Sinuses/Orbits: Mucosal thickening throughout the paranasal sinuses. Mastoid air cells are clear. Orbital soft tissues unremarkable. Other: None IMPRESSION: No acute intracranial abnormality. Atrophy, chronic microvascular disease. Electronically Signed   By: Rolm Baptise M.D.   On: 05/22/2017 11:07   Ct Chest W Contrast  Result Date: 05/23/2017 CLINICAL DATA:  Cough and congestion worsening for few days. Shortness of breath. History of COPD, cardiomyopathy, lung cancer. EXAM: CT CHEST WITH CONTRAST  TECHNIQUE: Multidetector CT imaging of the chest was performed during intravenous contrast administration. CONTRAST:  64mL ISOVUE-300 IOPAMIDOL (ISOVUE-300) INJECTION 61% COMPARISON:  Chest radiograph April 19th 2019 and CT chest November 10, 2015 and CT chest September 07, 2015 FINDINGS: CARDIOVASCULAR: The heart is mildly enlarged and unchanged. No pericardial effusion. Severe coronary artery calcifications. Thoracic aorta is normal course and caliber with moderate calcific atherosclerosis. large mural based filling defect RIGHT main pulmonary artery narrowing the lumen by at least 50%. Propagation of soft tissue into RIGHT upper lobe pulmonary artery with partial occlusion. MEDIASTINUM/NODES: Similar mediastinal, worsening hilar lymphadenopathy. LEFT AICD. LUNGS/PLEURA: Similar RIGHT lung bronchiectasis, with bilateral subpleural fibrosis. Faint ground-glass nodules LEFT lower lobe new from prior CT. LEFT calcified granuloma. Stable moderate RIGHT pleural effusion. UPPER ABDOMEN: Nonacute. Small hiatal hernia. Mildly hypodense liver likely reflecting hepatic steatosis. MUSCULOSKELETAL: Nonacute. Osteopenia and mild degenerative change of the thoracic spine. IMPRESSION: 1. Large chronic RIGHT pulmonary embolism occlusive in RIGHT upper lobe. At least 50% stenosis RIGHT main pulmonary artery. 2. Ground-glass centrilobular nodules LEFT lower lobe may be infectious or inflammatory. Mildly worsening hilar lymphadenopathy. 3. Similar extensive fibrosis with bronchiectasis seen with chronic interstitial lung disease. 4. Chronic RIGHT moderate pleural effusion. 5. Acute findings discussed with and reconfirmed by Dr.Reiko Vinje on 05/23/2017 at 2:44 pm. Electronically Signed   By: Elon Alas M.D.   On: 05/23/2017 14:47   Ct Abdomen Pelvis W Contrast  Result Date: 05/04/2017 CLINICAL DATA:  Constipation, nausea EXAM: CT ABDOMEN AND PELVIS WITH CONTRAST TECHNIQUE: Multidetector CT imaging of the abdomen and pelvis was  performed using the standard protocol following bolus administration of intravenous contrast. CONTRAST:  112mL ISOVUE-300 IOPAMIDOL (ISOVUE-300) INJECTION 61% COMPARISON:  03/24/2017, 08/28/2016, 01/13/2016 FINDINGS: Lower chest: Chronic rim enhancing pleural effusion at the right base. Right greater than left lung base fibrosis, similar pattern compared to prior. Volume loss on the right with shift of mediastinal contents to the right. Partially visualized cardiac pacing leads. Borderline cardiomegaly. Moderate hiatal hernia. Hepatobiliary: No focal hepatic abnormality. Calcified gallstone. No biliary dilatation Pancreas: Unremarkable. No pancreatic ductal dilatation or surrounding inflammatory changes. Spleen: Normal in size without focal abnormality. Adrenals/Urinary Tract: Adrenal glands are within normal limits. Kidneys show no hydronephrosis. 9 mm posterior and 7 mm anterior mid right renal indeterminate lesions are again noted. Urinary bladder slightly lobulated in contour but without focal wall thickening. Stomach/Bowel: Stomach is nonenlarged. Fluid-filled distal small bowel loops. Diffuse liquid stools throughout the colon without wall thickening. Moderate amount of formed stools within the rectosigmoid colon. Vascular/Lymphatic: Moderate aortic atherosclerosis. Ectatic infrarenal abdominal aorta up to 2.7 cm. No significantly enlarged lymph nodes. Reproductive: Prostate calcifications. Slightly enlarged prostate gland. Fluid versus high-riding testicle in the right inguinal region. Other: Negative for free air or free fluid. Musculoskeletal: Degenerative changes of the spine. No acute or suspicious abnormality. IMPRESSION: 1. Diffuse liquid stools throughout the colon but without colon wall thickening. Moderate formed stools  within the rectosigmoid colon, suggesting constipation. 2. Chronic rim enhancing and loculated right lung base pleural effusion. Right greater than left lung base pulmonary fibrosis  without significant interval change 3. Gallstone 4. Diffuse aortic atherosclerosis with ectatic infrarenal abdominal aorta up to 2.7 cm. Ectatic abdominal aorta at risk for aneurysm development. Recommend followup by ultrasound in 5 years. This recommendation follows ACR consensus guidelines: White Paper of the ACR Incidental Findings Committee II on Vascular Findings. J Am Coll Radiol 2013; 10:789-794. 5. Moderate hiatal hernia Electronically Signed   By: Donavan Foil M.D.   On: 05/04/2017 21:01   US Venous Img Upper Uni Left  Result Date: 05/26/2017 CLINICAL DATA:  83 year old male with a history left arm swelling EXAM: LEFT UPPER EXTREMITY VENOUS DOPPLER ULTRASOUND TECHNIQUE: Gray-scale sonography with graded compression, as well as color Doppler and duplex ultrasound were performed to evaluate the upper extremity deep venous system from the level of the subclavian vein and including the jugular, axillary, basilic, radial, ulnar and upper cephalic vein. Spectral Doppler was utilized to evaluate flow at rest and with distal augmentation maneuvers. COMPARISON:  None. FINDINGS: Contralateral Subclavian Vein: Respiratory phasicity is normal and symmetric with the symptomatic side. No evidence of thrombus. Normal compressibility. Internal Jugular Vein: No evidence of thrombus. Normal compressibility, respiratory phasicity and response to augmentation. Subclavian Vein: No evidence of thrombus. Normal compressibility, respiratory phasicity and response to augmentation. Axillary Vein: No evidence of thrombus. Normal compressibility, respiratory phasicity and response to augmentation. Cephalic Vein: No evidence of thrombus. Normal compressibility, respiratory phasicity and response to augmentation. Basilic Vein: No evidence of thrombus. Normal compressibility, respiratory phasicity and response to augmentation. Brachial Veins: No evidence of thrombus. Normal compressibility, respiratory phasicity and response to  augmentation. Radial Veins: No evidence of thrombus. Normal compressibility, respiratory phasicity and response to augmentation. Ulnar Veins: No evidence of thrombus. Normal compressibility, respiratory phasicity and response to augmentation. Other Findings:  None visualized. IMPRESSION: Sonographic survey of the left upper extremity negative for DVT. Electronically Signed   By: Corrie Mckusick D.O.   On: 05/26/2017 12:38   Dg Chest Port 1 View  Result Date: 05/22/2017 CLINICAL DATA:  Shortness of breath. EXAM: PORTABLE CHEST 1 VIEW COMPARISON:  04/29/2017. FINDINGS: AICD in stable position. Stable cardiomegaly. Stable bilateral interstitial prominence consistent chronic interstitial lung disease. Stable right pleural thickening consistent scarring. Prior cardiac valve replacement. No acute bony abnormality. IMPRESSION: 1. Stable changes of chronic interstitial lung disease and right-sided pleural scarring. 2. AICD in stable position. Prior cardiac valve replacement. Stable cardiomegaly. Electronically Signed   By: Marcello Moores  Register   On: 05/22/2017 10:23     CBC Recent Labs  Lab 05/22/17 1003 05/23/17 0424 05/24/17 0446 05/25/17 0447  WBC 7.8 7.6 6.4 16.1*  HGB 14.3 13.5 13.3 13.1  HCT 42.7 39.8* 39.9* 38.4*  PLT 183 165 162 160  MCV 90.7 91.0 90.9 89.6  MCH 30.4 30.9 30.3 30.6  MCHC 33.5 33.9 33.3 34.2  RDW 15.4* 15.2* 15.2* 15.2*    Chemistries  Recent Labs  Lab 05/22/17 1003 05/23/17 0424 05/24/17 0446 05/25/17 0447  NA 135 135 133* 133*  K 4.6 4.5 4.9 5.2*  CL 100* 100* 98* 101  CO2 28 28 28 25   GLUCOSE 120* 137* 119* 181*  BUN 18 16 17  28*  CREATININE 0.88 0.85 0.92 1.04  CALCIUM 9.0 9.1 9.0 8.7*  AST 30  --   --   --   ALT 19  --   --   --  ALKPHOS 77  --   --   --   BILITOT 0.6  --   --   --    ------------------------------------------------------------------------------------------------------------------ estimated creatinine clearance is 58.5 mL/min (by C-G  formula based on SCr of 1.04 mg/dL). ------------------------------------------------------------------------------------------------------------------ No results for input(s): HGBA1C in the last 72 hours. ------------------------------------------------------------------------------------------------------------------ No results for input(s): CHOL, HDL, LDLCALC, TRIG, CHOLHDL, LDLDIRECT in the last 72 hours. ------------------------------------------------------------------------------------------------------------------ No results for input(s): TSH, T4TOTAL, T3FREE, THYROIDAB in the last 72 hours.  Invalid input(s): FREET3 ------------------------------------------------------------------------------------------------------------------ No results for input(s): VITAMINB12, FOLATE, FERRITIN, TIBC, IRON, RETICCTPCT in the last 72 hours.  Coagulation profile No results for input(s): INR, PROTIME in the last 168 hours.  No results for input(s): DDIMER in the last 72 hours.  Cardiac Enzymes Recent Labs  Lab 05/26/17 1027  TROPONINI 0.14*   ------------------------------------------------------------------------------------------------------------------ Invalid input(s): POCBNP    Assessment & Plan   Russell Howard  is a 83 y.o. male with a known history of cardiomyopathy with EF of 45%, COPD on 2 L home oxygen, remote history of smoking, paroxysmal A. fib, hyperlipidemia, valvular heart disease, benign prostatic hypertrophy requiring self-catheterization twice a day presents to hospital secondary to worsening weakness and sleepiness.  1.  Shortness of breath Multifactorial including chronic pulmonary embolism Chronic underlying interstitial lung disease Due to persistent cough pulmonary seen the patient Continue IV Solu-Medrol  2.  Acute cystitis-urine culture showing 70,000 MRSA bacteria unclear clinical significance Continue IV doxycycline  3.  Paroxysmal atrial  fibrillation-continue digoxin Rate well controlled.  Patient is status post AICD and pacemaker -Follows with outpatient cardiologist.  On aspirin for anticoagulation.  4.    Left-sided chest pain with elevated troponin patient's troponin was not normal on admission I will ask cardiology to see  5.  BPH-with urinary retention, status post self catheterizations.  Will continue  6.  Chronic hypotension-continue hydrocortisone and midodrine  7  DVT prophylaxis-Lovenox          Code Status Orders  (From admission, onward)        Start     Ordered   05/22/17 1610  Do not attempt resuscitation (DNR)  Continuous    Question Answer Comment  In the event of cardiac or respiratory ARREST Do not call a "code blue"   In the event of cardiac or respiratory ARREST Do not perform Intubation, CPR, defibrillation or ACLS   In the event of cardiac or respiratory ARREST Use medication by any route, position, wound care, and other measures to relive pain and suffering. May use oxygen, suction and manual treatment of airway obstruction as needed for comfort.      05/22/17 1609    Code Status History    Date Active Date Inactive Code Status Order ID Comments User Context   11/17/2016 1631 11/20/2016 1602 DNR [id number]  Vaughan Basta, MD Inpatient   08/19/2016 1315 08/30/2016 1728 DNR [id number]  Harrie Foreman, MD Inpatient   03/02/2016 2236 03/05/2016 2020 DNR [id number]  Mikael Spray, NP ED   01/09/2016 0658 01/14/2016 1939 DNR [id number]  Harrie Foreman, MD Inpatient   11/09/2015 0318 11/12/2015 1713 DNR [id number]  Harrie Foreman, MD Inpatient   06/14/2015 1639 06/18/2015 1513 DNR [id number]  Demetrios Loll, MD Inpatient    Advance Directive Documentation     Most Recent Value  Type of Advance Directive  Healthcare Power of Attorney, Living will, Out of facility DNR (pink MOST or yellow form)  Pre-existing out of facility DNR order (yellow form or  pink MOST form)  Pink MOST  form placed in chart (order not valid for inpatient use)  "MOST" Form in Place?  -           Consults  none  DVT Prophylaxis  Lovenox   Lab Results  Component Value Date   PLT 160 05/25/2017     Time Spent in minutes   19min  Greater than 50% of time spent in care coordination and counseling patient regarding the condition and plan of care.   Dustin Flock M.D on 05/26/2017 at 1:12 PM  Between 7am to 6pm - Pager - [phone]  After 6pm go to [email or web address] - Proofreader  [employer]   Office  [phone]

## 2017-05-26 NOTE — Consult Note (Signed)
Reason for Consult: Atypical chest pain Referring Physician: Congestive heart failure chest pain shortness of breath atrial fibrillation  Russell Howard is an 83 y.o. male.  HPI: Patient has a history of multiple medical problems thyroid disease BPH cardiomyopathy AICD in place diastolic congestive heart failure COPD reflux valvular heart disease A. fib lung cancer hyperlipidemia.  Patient was brought to the emergency room after being found in possible sepsis presentation.  Patient has hypoxemia on home O2 cardiomyopathy mild reduced ejection fraction 45% hyperlipidemia artery disease BPH patient had a catheterization for urinary retention which she does twice a day.. She has noticed worsening fatigue "chills sweating weakness fatigue so presented to the emergency room for evaluation  Past Medical History:  Diagnosis Date  . Acquired hypothyroidism 11/02/2014  . BPH (benign prostatic hyperplasia)   . Cardiac defibrillator in place 11/02/2014  . Cardiomyopathy ([place])   . CHF (congestive heart failure) ([place])   . Chronic diastolic heart failure ([place]) 11/02/2014  . COPD (chronic obstructive pulmonary disease) ([place])   . Gastro-esophageal reflux disease without esophagitis 11/02/2014  . H/O malignant neoplasm 11/09/2014  . Heart valve disease 11/02/2014  . History of atrial fibrillation 11/02/2014  . Lung cancer ([place])   . Neuropathy 11/02/2014  . Orthostasis 11/02/2014  . Pure hypercholesterolemia 11/02/2014  . Valvular heart disease     Past Surgical History:  Procedure Laterality Date  . APPENDECTOMY    . CARDIAC VALVE REPLACEMENT     with a bovine valve  . DUAL ICD IMPLANT  2007/2009   implantable cardiac defibrillator  . PORT-A-CATH REMOVAL      Family History  Problem Relation Age of Onset  . Hypertension Unknown   . Heart disease Unknown   . CAD Father     Social History:  reports that he quit smoking about 35 years ago. His smoking use included cigarettes. He has never used smokeless  tobacco. He reports that he does not drink alcohol or use drugs.  Allergies:  Allergies  Allergen Reactions  . Penicillin G Hives    Has patient had a PCN reaction causing immediate rash, facial/tongue/throat swelling, SOB or lightheadedness with hypotension: Yes Has patient had a PCN reaction causing severe rash involving mucus membranes or skin necrosis: No Has patient had a PCN reaction that required hospitalization No Has patient had a PCN reaction occurring within the last 10 years: No If all of the above answers are "NO", then may proceed with Cephalosporin use.  . Sulfa Antibiotics Rash    Medications: I have reviewed the patient's current medications.  Results for orders placed or performed during the hospital encounter of 05/22/17 (from the past 48 hour(s))  CBC     Status: Abnormal   Collection Time: 05/25/17  4:47 AM  Result Value Ref Range   WBC 16.1 (H) 3.8 - 10.6 K/uL   RBC 4.29 (L) 4.40 - 5.90 MIL/uL   Hemoglobin 13.1 13.0 - 18.0 g/dL   HCT 38.4 (L) 40.0 - 52.0 %   MCV 89.6 80.0 - 100.0 fL   MCH 30.6 26.0 - 34.0 pg   MCHC 34.2 32.0 - 36.0 g/dL   RDW 15.2 (H) 11.5 - 14.5 %   Platelets 160 150 - 440 K/uL    Comment: Performed at [facility], [address] [place], [place] [address]  Basic metabolic panel     Status: Abnormal   Collection Time: 05/25/17  4:47 AM  Result Value Ref Range   Sodium 133 (L) 135 -  145 mmol/L   Potassium 5.2 (H) 3.5 - 5.1 mmol/L   Chloride 101 101 - 111 mmol/L   CO2 25 22 - 32 mmol/L   Glucose, Bld 181 (H) 65 - 99 mg/dL   BUN 28 (H) 6 - 20 mg/dL   Creatinine, Ser 1.04 0.61 - 1.24 mg/dL   Calcium 8.7 (L) 8.9 - 10.3 mg/dL   GFR calc non Af Amer >60 >60 mL/min   GFR calc Af Amer >60 >60 mL/min    Comment: (NOTE) The eGFR has been calculated using the CKD EPI equation. This calculation has not been validated in all clinical situations. eGFR's persistently <60 mL/min signify possible Chronic Kidney Disease.    Anion gap 7  5 - 15    Comment: Performed at [facility], [place], [place] [address]  Troponin I (q 6hr x 3)     Status: Abnormal   Collection Time: 05/26/17 10:27 AM  Result Value Ref Range   Troponin I 0.14 (HH) <0.03 ng/mL    Comment: CRITICAL RESULT CALLED TO, READ BACK BY AND VERIFIED WITH DAWN FURGUSON_0  ON 05/26/17 BY HKP Performed at [facility], [address] [place], [place] [address]   Troponin I (q 6hr x 3)     Status: Abnormal   Collection Time: 05/26/17  4:29 PM  Result Value Ref Range   Troponin I 0.41 (HH) <0.03 ng/mL    Comment: CRITICAL VALUE NOTED. VALUE IS CONSISTENT WITH PREVIOUSLY REPORTED/CALLED VALUE.  TFK Performed at [facility], [place], [place] [address]   Troponin I (q 6hr x 3)     Status: Abnormal   Collection Time: 05/26/17 10:13 PM  Result Value Ref Range   Troponin I 0.42 (HH) <0.03 ng/mL    Comment: CRITICAL VALUE NOTED. VALUE IS CONSISTENT WITH PREVIOUSLY REPORTED/CALLED VALUE. JAG Performed at [facility], [address] [place],  [address]     US Venous Img Upper Uni Left  Result Date: 05/26/2017 CLINICAL DATA:  83 year old male with a history left arm swelling EXAM: LEFT UPPER EXTREMITY VENOUS DOPPLER ULTRASOUND TECHNIQUE: Gray-scale sonography with graded compression, as well as color Doppler and duplex ultrasound were performed to evaluate the upper extremity deep venous system from the level of the subclavian vein and including the jugular, axillary, basilic, radial, ulnar and upper cephalic vein. Spectral Doppler was utilized to evaluate flow at rest and with distal augmentation maneuvers. COMPARISON:  None. FINDINGS: Contralateral Subclavian Vein: Respiratory phasicity is normal and symmetric with the symptomatic side. No evidence of thrombus. Normal compressibility. Internal Jugular Vein: No evidence of thrombus. Normal compressibility, respiratory phasicity and  response to augmentation. Subclavian Vein: No evidence of thrombus. Normal compressibility, respiratory phasicity and response to augmentation. Axillary Vein: No evidence of thrombus. Normal compressibility, respiratory phasicity and response to augmentation. Cephalic Vein: No evidence of thrombus. Normal compressibility, respiratory phasicity and response to augmentation. Basilic Vein: No evidence of thrombus. Normal compressibility, respiratory phasicity and response to augmentation. Brachial Veins: No evidence of thrombus. Normal compressibility, respiratory phasicity and response to augmentation. Radial Veins: No evidence of thrombus. Normal compressibility, respiratory phasicity and response to augmentation. Ulnar Veins: No evidence of thrombus. Normal compressibility, respiratory phasicity and response to augmentation. Other Findings:  None visualized. IMPRESSION: Sonographic survey of the left upper extremity negative for DVT. Electronically Signed   By: Corrie Mckusick D.O.   On: 05/26/2017 12:38    Review of Systems  Constitutional: Positive for chills, diaphoresis,  fever and malaise/fatigue.  HENT: Positive for congestion.   Eyes: Negative.   Respiratory: Positive for cough.   Cardiovascular: Positive for chest pain, palpitations and PND.  Gastrointestinal: Positive for heartburn.  Genitourinary: Negative.   Musculoskeletal: Positive for back pain and myalgias.  Skin: Negative.   Neurological: Positive for dizziness.  Endo/Heme/Allergies: Negative.   Psychiatric/Behavioral: Negative.    Blood pressure 140/64, pulse 93, temperature 97.6 F (36.4 C), temperature source Oral, resp. rate 14, height _0  (1.778 m), weight 179 lb 14.3 oz (81.6 kg), SpO2 97 %. Physical Exam  Nursing note and vitals reviewed. Constitutional: He is oriented to person, place, and time. He appears well-developed and well-nourished.  HENT:  Head: Normocephalic and atraumatic.  Eyes: Pupils are equal, round, and  reactive to light. Conjunctivae and EOM are normal.  Neck: Normal range of motion. Neck supple.  Cardiovascular: Normal rate, regular rhythm and normal heart sounds.  Respiratory: Breath sounds normal.  GI: Bowel sounds are normal.  Musculoskeletal: Normal range of motion. He exhibits edema.  Neurological: He is alert and oriented to person, place, and time. He has normal reflexes.  Skin: Skin is warm and dry.  Psychiatric: He has a normal mood and affect.    Assessment/Plan: Sepsis Chest pain GERD Cardiomyopathy AICD in place COPD Congestive heart failure diastolic dysfunction History of atrial fibrillation Hyperlipidemia . Plan Agree with admission to telemetry Rule out for myocardial infarction follow-up EKGs and troponins Rate control for atrial fibrillation Inhalers for COPD Diuretic therapy for diastolic congestive heart failure Broad-spectrum antibiotic therapy for sepsis Increase p.o. intake to assist in weight gain Interrogate permanent pacemaker Do not recommend invasive strategy at this point for cardiac assessment  Dwayne D Callwood 05/26/2017, 11:49 PM

## 2017-05-26 NOTE — Care Management (Signed)
Lives with son and daughter in law.  PCP [facility]. Prescriptions are filled at [facility] on [address].  Has a portable O2 concentrator through [employer]. Nebulizer, rolling walker, wheelchair, raised toilet and flexible bed in the home.  PT consult pending. Patient states that he is not willing to go to SNF if recommended.  Patient states that he is agreeable to home health services.  Patient states "someone is coming out to the house, but you will have to check with my daughter in law [name] to see who it is".  Patient has previously been open with [facility], but per [facility] with [facility] patient is not currently active.

## 2017-05-26 NOTE — Progress Notes (Addendum)
OT Cancellation Note  Patient Details Name: Matheu Ploeger MRN: [id number] DOB: 10-22-1934   Cancelled Treatment:    Reason Eval/Treat Not Completed: Medical issues which prohibited therapy. Per chart review, pt recently reporting left-sided chest pain with elevated troponin (new as of admission). Orders for trending troponin and cardiology consult pending. [place] LUE reveals no DVT. Additionally, last K+ taken 4/22 and noted to be elevated above threshold for appropriateness for therapy (5.2). Will hold OT treatment this date and re-attempt at later date/time as pt is medically appropriate.   Jeni Salles, MPH, MS, OTR/L ascom [phone] 05/26/17, 1:31 PM

## 2017-05-26 NOTE — Progress Notes (Signed)
MD Posey Pronto was notified that trop was 0.14

## 2017-05-26 NOTE — Progress Notes (Signed)
MD Posey Pronto was made aware of trop 0.41

## 2017-05-27 LAB — TROPONIN I: Troponin I: 0.26 ng/mL (ref <0.03)

## 2017-05-27 LAB — CULTURE, BLOOD (ROUTINE X 2)
CULTURE: NO GROWTH
Culture: NO GROWTH
SPECIAL REQUESTS: ADEQUATE
Special Requests: ADEQUATE

## 2017-05-27 MED ORDER — PREDNISONE 20 MG PO TABS: 40.0000 mg | ORAL_TABLET | Freq: Every day | ORAL | Status: DC

## 2017-05-27 MED ORDER — PREDNISONE 10 MG PO TABS: 10.0000 mg | ORAL_TABLET | Freq: Every day | ORAL | Status: DC

## 2017-05-27 MED ORDER — PREDNISONE 50 MG PO TABS
60.0000 mg | ORAL_TABLET | Freq: Every day | ORAL | Status: AC
  Administered 2017-05-28: 60 mg via ORAL
  Filled 2017-05-27: qty 1

## 2017-05-27 MED ORDER — PREDNISONE 20 MG PO TABS: 30.0000 mg | ORAL_TABLET | Freq: Every day | ORAL | Status: DC

## 2017-05-27 MED ORDER — SENNA 8.6 MG PO TABS
1.0000 | ORAL_TABLET | Freq: Two times a day (BID) | ORAL | Status: DC
  Administered 2017-05-27 – 2017-05-29 (×3): 8.6 mg via ORAL
  Filled 2017-05-27 (×3): qty 1

## 2017-05-27 MED ORDER — PREDNISONE 50 MG PO TABS
50.0000 mg | ORAL_TABLET | Freq: Every day | ORAL | Status: AC
  Administered 2017-05-29: 50 mg via ORAL
  Filled 2017-05-27: qty 1

## 2017-05-27 MED ORDER — PREDNISONE 20 MG PO TABS: 20.0000 mg | ORAL_TABLET | Freq: Every day | ORAL | Status: DC

## 2017-05-27 MED ORDER — PROMETHAZINE HCL 25 MG/ML IJ SOLN: 12.5000 mg | Freq: Four times a day (QID) | INTRAMUSCULAR | Status: DC | PRN

## 2017-05-27 NOTE — Progress Notes (Signed)
OT Cancellation Note  Patient Details Name: Russell Howard MRN: [id number] DOB: 03-11-1934   Cancelled Treatment:    Reason Eval/Treat Not Completed: Medical issues which prohibited therapy. Per cardiology, need to rule out for myocardial infarction with follow-up EKGs and troponin. Troponin currently trending upwards (.14, .41, .42). No new value for K+ since 4/22 which remains above the threshold for appropriateness for therapy (5.2). Will hold OT and re-attempt at later date/time as pt is medically appropriate.   Jeni Salles, MPH, MS, OTR/L ascom [phone] 05/27/17, 9:41 AM

## 2017-05-27 NOTE — Progress Notes (Signed)
PT Cancellation Note  Patient Details Name: Russell Howard MRN: [id number] DOB: 10-28-1934   Cancelled Treatment:    Reason Eval/Treat Not Completed: Medical issues which prohibited therapy; Per cardiology, need to rule out for myocardial infarction with follow-up EKGs and troponin. Troponin currently trending upwards (.14, .41, .42). No new value for K+ since 4/22 which remains above the threshold for appropriateness for therapy (5.2). Will hold PT and re-attempt at later date/time as pt is medically appropriate.      Linus Salmons PT, DPT 05/27/17, 11:09 AM

## 2017-05-27 NOTE — Progress Notes (Addendum)
[name]                                                                                                                                                                                  Patient Demographics   Russell Howard, is a 83 y.o. male, DOB - 1934-09-03, FUX:[id number]  Admit date - 05/22/2017   Admitting Physician Gladstone Lighter, MD  Outpatient Primary MD for the patient is [name], MD   LOS - 5  Subjective: pain in the arm is now resolved Cough improved   Review of Systems:   CONSTITUTIONAL: No documented fever. No fatigue, weakness. No weight gain, no weight loss.  EYES: No blurry or double vision.  ENT: No tinnitus. No postnasal drip. No redness of the oropharynx.  RESPIRATORY: +cough, no wheeze, no hemoptysis. + dyspnea.  CARDIOVASCULAR: Positive chest pain. No orthopnea. No palpitations. No syncope.  GASTROINTESTINAL: No nausea, no vomiting or diarrhea. No abdominal pain. No melena or hematochezia.  GENITOURINARY: No dysuria or hematuria.  ENDOCRINE: No polyuria or nocturia. No heat or cold intolerance.  HEMATOLOGY: No anemia. No bruising. No bleeding.  INTEGUMENTARY: No rashes. No lesions.  MUSCULOSKELETAL: No arthritis. No swelling. No gout.  NEUROLOGIC: No numbness, tingling, or ataxia. No seizure-type activity.  PSYCHIATRIC: No anxiety. No insomnia. No ADD.    Vitals:   Vitals:   05/26/17 1925 05/26/17 2100 05/27/17 0357 05/27/17 0936  BP: (!) 154/81 140/64 (!) 147/71 (!) 144/73  Pulse: 89 93 73 81  Resp: 14  18 18   Temp: 97.6 F (36.4 C)  98.2 F (36.8 C) (!) 97.5 F (36.4 C)  TempSrc: Oral   Oral  SpO2: 97%  95% 97%  Weight:      Height:        Wt Readings from Last 3 Encounters:  05/22/17 81.6 kg (179 lb 14.3 oz)  05/04/17 77.1 kg (170 lb)  04/29/17 77.1 kg (170 lb)     Intake/Output Summary (Last 24 hours) at 05/27/2017 1425 Last data filed at 05/27/2017 0254 Gross per 24 hour  Intake -   Output 945 ml  Net -945 ml    Physical Exam:   GENERAL: Pleasant-appearing in no apparent distress.  HEAD, EYES, EARS, NOSE AND THROAT: Atraumatic, normocephalic. Extraocular muscles are intact. Pupils equal and reactive to light. Sclerae anicteric. No conjunctival injection. No oro-pharyngeal erythema.  NECK: Supple. There is no jugular venous distention. No bruits, no lymphadenopathy, no thyromegaly.  HEART: Regular rate and rhythm,. No murmurs, no rubs, no clicks.  LUNGS: Clear to auscultation bilaterally. No rales or rhonchi. No wheezes.  ABDOMEN: Soft, flat, nontender, nondistended. Has good bowel  sounds. No hepatosplenomegaly appreciated.  EXTREMITIES: No evidence of any cyanosis, clubbing, or peripheral edema.  +2 pedal and radial pulses bilaterally.  NEUROLOGIC: The patient is alert, awake, and oriented x3 with no focal motor or sensory deficits appreciated bilaterally.  SKIN: Moist and warm with no rashes appreciated.  Psych: Not anxious, depressed LN: No inguinal LN enlargement    Antibiotics   Anti-infectives (From admission, onward)   Start     Dose/Rate Route Frequency Ordered Stop   05/26/17 2200  doxycycline (VIBRA-TABS) tablet 100 mg     100 mg Oral Every 12 hours 05/26/17 1417     05/24/17 1600  doxycycline (VIBRAMYCIN) 100 mg in sodium chloride 0.9 % 250 mL IVPB  Status:  Discontinued     100 mg 125 mL/hr over 120 Minutes Intravenous Every 12 hours 05/24/17 1323 05/26/17 1417   05/23/17 1000  levofloxacin (LEVAQUIN) IVPB 750 mg  Status:  Discontinued     750 mg 100 mL/hr over 90 Minutes Intravenous Every 24 hours 05/22/17 1338 05/22/17 1504   05/23/17 1000  levofloxacin (LEVAQUIN) IVPB 750 mg  Status:  Discontinued     750 mg 100 mL/hr over 90 Minutes Intravenous Every 24 hours 05/22/17 1504 05/24/17 1321   05/22/17 1800  aztreonam (AZACTAM) 2 g in sodium chloride 0.9 % 100 mL IVPB  Status:  Discontinued     2 g 200 mL/hr over 30 Minutes Intravenous Every 8  hours 05/22/17 1338 05/22/17 1504   05/22/17 1800  vancomycin (VANCOCIN) IVPB 1000 mg/200 mL premix  Status:  Discontinued     1,000 mg 200 mL/hr over 60 Minutes Intravenous Every 12 hours 05/22/17 1338 05/22/17 1504   05/22/17 1000  levofloxacin (LEVAQUIN) IVPB 750 mg     750 mg 100 mL/hr over 90 Minutes Intravenous  Once 05/22/17 0949 05/22/17 1317   05/22/17 1000  aztreonam (AZACTAM) 2 g in sodium chloride 0.9 % 100 mL IVPB     2 g 200 mL/hr over 30 Minutes Intravenous  Once 05/22/17 0949 05/22/17 1135   05/22/17 1000  vancomycin (VANCOCIN) IVPB 1000 mg/200 mL premix     1,000 mg 200 mL/hr over 60 Minutes Intravenous  Once 05/22/17 0949 05/22/17 1317      Medications   Scheduled Meds: . acidophilus  1 capsule Oral QPM  . apixaban  10 mg Oral BID   Followed by  . [START ON 05/31/2017] apixaban  5 mg Oral BID  . aspirin EC  81 mg Oral Daily  . benzonatate  200 mg Oral TID  . budesonide (PULMICORT) nebulizer solution  0.25 mg Nebulization BID  . cholecalciferol  5,000 Units Oral Daily  . digoxin  0.125 mg Oral Daily  . docusate sodium  100 mg Oral BID  . doxycycline  100 mg Oral Q12H  . famotidine  20 mg Oral Daily  . fluticasone  2 spray Each Nare Daily  . gabapentin  600 mg Oral BID  . guaiFENesin  600 mg Oral BID  . ipratropium-albuterol  3 mL Nebulization Q6H  . levothyroxine  100 mcg Oral QAC breakfast  . magnesium oxide  400 mg Oral QPM  . methenamine  1,000 mg Oral BID WC  . methylPREDNISolone (SOLU-MEDROL) injection  60 mg Intravenous Q12H  . midodrine  10 mg Oral TID  . multivitamin with minerals  1 tablet Oral Daily  . pantoprazole  40 mg Oral QAC breakfast  . pyridostigmine  60 mg Oral BID  . simvastatin  20  mg Oral QPM  . vitamin B-12  500 mcg Oral Daily  . vitamin C  500 mg Oral BID  . vitamin E  400 Units Oral QPM   Continuous Infusions:  PRN Meds:.acetaminophen **OR** acetaminophen, guaiFENesin-codeine, ipratropium-albuterol, ketorolac, morphine  injection, ondansetron **OR** ondansetron (ZOFRAN) IV, promethazine   Data Review:   Micro Results Recent Results (from the past 240 hour(s))  Blood Culture (routine x 2)     Status: None   Collection Time: 05/22/17 10:03 AM  Result Value Ref Range Status   Specimen Description BLOOD RIGHT FA  Final   Special Requests   Final    BOTTLES DRAWN AEROBIC AND ANAEROBIC Blood Culture adequate volume   Culture   Final    NO GROWTH 5 DAYS Performed at [facility], [address] [place], [place] [address]    Report Status 05/27/2017 FINAL  Final  Blood Culture (routine x 2)     Status: None   Collection Time: 05/22/17 10:04 AM  Result Value Ref Range Status   Specimen Description BLOOD RIGHT [facility]  Final   Special Requests   Final    BOTTLES DRAWN AEROBIC AND ANAEROBIC Blood Culture adequate volume   Culture   Final    NO GROWTH 5 DAYS Performed at [facility], [address] [place], [place] [address]    Report Status 05/27/2017 FINAL  Final  Urine Culture     Status: Abnormal   Collection Time: 05/22/17  3:11 PM  Result Value Ref Range Status   Specimen Description   Final    URINE, RANDOM Performed at [facility], [address], [place], [place] [address]    Special Requests   Final    NONE Performed at [facility], [address] [place], [place] [address]    Culture (A)  Final    70,000 COLONIES/mL METHICILLIN RESISTANT STAPHYLOCOCCUS AUREUS   Report Status 05/24/2017 FINAL  Final   Organism ID, Bacteria METHICILLIN RESISTANT STAPHYLOCOCCUS AUREUS (A)  Final      Susceptibility   Methicillin resistant staphylococcus aureus - MIC*    CIPROFLOXACIN >=8 RESISTANT Resistant     GENTAMICIN <=0.5 SENSITIVE Sensitive     NITROFURANTOIN <=16 SENSITIVE Sensitive     OXACILLIN >=4 RESISTANT Resistant     TETRACYCLINE <=1 SENSITIVE Sensitive     VANCOMYCIN <=0.5 SENSITIVE Sensitive     TRIMETH/SULFA <=10 SENSITIVE Sensitive      CLINDAMYCIN <=0.25 SENSITIVE Sensitive     RIFAMPIN <=0.5 SENSITIVE Sensitive     Inducible Clindamycin NEGATIVE Sensitive     * 70,000 COLONIES/mL METHICILLIN RESISTANT STAPHYLOCOCCUS AUREUS    Radiology Reports Dg Chest 2 View  Result Date: 04/29/2017 CLINICAL DATA:  Cough and shortness of Breath EXAM: CHEST - 2 VIEW COMPARISON:  03/27/2017 FINDINGS: Cardiac shadow is stable. Defibrillator is again noted. Postsurgical changes are seen. The left lung is clear with mild interstitial changes. Right lung again demonstrates diffuse chronic pleuroparenchymal changes similar to that seen on the prior exam. No acute abnormality is noted. IMPRESSION: Chronic changes without acute abnormality. Electronically Signed   By: Inez Catalina M.D.   On: 04/29/2017 15:53   Ct Head Wo Contrast  Result Date: 05/22/2017 CLINICAL DATA:  Increasing weakness. Altered level of consciousness. EXAM: CT HEAD WITHOUT CONTRAST TECHNIQUE: Contiguous axial images were obtained from the base of the skull through the vertex without intravenous contrast. COMPARISON:  02/25/2017 FINDINGS: Brain: There is atrophy and chronic small vessel disease changes.  No acute intracranial abnormality. Specifically, no hemorrhage, hydrocephalus, mass lesion, acute infarction, or significant intracranial injury. Vascular: No hyperdense vessel or unexpected calcification. Skull: No acute calvarial abnormality. Sinuses/Orbits: Mucosal thickening throughout the paranasal sinuses. Mastoid air cells are clear. Orbital soft tissues unremarkable. Other: None IMPRESSION: No acute intracranial abnormality. Atrophy, chronic microvascular disease. Electronically Signed   By: Rolm Baptise M.D.   On: 05/22/2017 11:07   Ct Chest W Contrast  Result Date: 05/23/2017 CLINICAL DATA:  Cough and congestion worsening for few days. Shortness of breath. History of COPD, cardiomyopathy, lung cancer. EXAM: CT CHEST WITH CONTRAST TECHNIQUE: Multidetector CT imaging of the  chest was performed during intravenous contrast administration. CONTRAST:  30mL ISOVUE-300 IOPAMIDOL (ISOVUE-300) INJECTION 61% COMPARISON:  Chest radiograph April 19th 2019 and CT chest November 10, 2015 and CT chest September 07, 2015 FINDINGS: CARDIOVASCULAR: The heart is mildly enlarged and unchanged. No pericardial effusion. Severe coronary artery calcifications. Thoracic aorta is normal course and caliber with moderate calcific atherosclerosis. large mural based filling defect RIGHT main pulmonary artery narrowing the lumen by at least 50%. Propagation of soft tissue into RIGHT upper lobe pulmonary artery with partial occlusion. MEDIASTINUM/NODES: Similar mediastinal, worsening hilar lymphadenopathy. LEFT AICD. LUNGS/PLEURA: Similar RIGHT lung bronchiectasis, with bilateral subpleural fibrosis. Faint ground-glass nodules LEFT lower lobe new from prior CT. LEFT calcified granuloma. Stable moderate RIGHT pleural effusion. UPPER ABDOMEN: Nonacute. Small hiatal hernia. Mildly hypodense liver likely reflecting hepatic steatosis. MUSCULOSKELETAL: Nonacute. Osteopenia and mild degenerative change of the thoracic spine. IMPRESSION: 1. Large chronic RIGHT pulmonary embolism occlusive in RIGHT upper lobe. At least 50% stenosis RIGHT main pulmonary artery. 2. Ground-glass centrilobular nodules LEFT lower lobe may be infectious or inflammatory. Mildly worsening hilar lymphadenopathy. 3. Similar extensive fibrosis with bronchiectasis seen with chronic interstitial lung disease. 4. Chronic RIGHT moderate pleural effusion. 5. Acute findings discussed with and reconfirmed by Dr.Aneyah Lortz on 05/23/2017 at 2:44 pm. Electronically Signed   By: Elon Alas M.D.   On: 05/23/2017 14:47   Ct Abdomen Pelvis W Contrast  Result Date: 05/04/2017 CLINICAL DATA:  Constipation, nausea EXAM: CT ABDOMEN AND PELVIS WITH CONTRAST TECHNIQUE: Multidetector CT imaging of the abdomen and pelvis was performed using the standard protocol  following bolus administration of intravenous contrast. CONTRAST:  113mL ISOVUE-300 IOPAMIDOL (ISOVUE-300) INJECTION 61% COMPARISON:  03/24/2017, 08/28/2016, 01/13/2016 FINDINGS: Lower chest: Chronic rim enhancing pleural effusion at the right base. Right greater than left lung base fibrosis, similar pattern compared to prior. Volume loss on the right with shift of mediastinal contents to the right. Partially visualized cardiac pacing leads. Borderline cardiomegaly. Moderate hiatal hernia. Hepatobiliary: No focal hepatic abnormality. Calcified gallstone. No biliary dilatation Pancreas: Unremarkable. No pancreatic ductal dilatation or surrounding inflammatory changes. Spleen: Normal in size without focal abnormality. Adrenals/Urinary Tract: Adrenal glands are within normal limits. Kidneys show no hydronephrosis. 9 mm posterior and 7 mm anterior mid right renal indeterminate lesions are again noted. Urinary bladder slightly lobulated in contour but without focal wall thickening. Stomach/Bowel: Stomach is nonenlarged. Fluid-filled distal small bowel loops. Diffuse liquid stools throughout the colon without wall thickening. Moderate amount of formed stools within the rectosigmoid colon. Vascular/Lymphatic: Moderate aortic atherosclerosis. Ectatic infrarenal abdominal aorta up to 2.7 cm. No significantly enlarged lymph nodes. Reproductive: Prostate calcifications. Slightly enlarged prostate gland. Fluid versus high-riding testicle in the right inguinal region. Other: Negative for free air or free fluid. Musculoskeletal: Degenerative changes of the spine. No acute or suspicious abnormality. IMPRESSION: 1. Diffuse liquid stools throughout the colon but  without colon wall thickening. Moderate formed stools within the rectosigmoid colon, suggesting constipation. 2. Chronic rim enhancing and loculated right lung base pleural effusion. Right greater than left lung base pulmonary fibrosis without significant interval change 3.  Gallstone 4. Diffuse aortic atherosclerosis with ectatic infrarenal abdominal aorta up to 2.7 cm. Ectatic abdominal aorta at risk for aneurysm development. Recommend followup by ultrasound in 5 years. This recommendation follows ACR consensus guidelines: White Paper of the ACR Incidental Findings Committee II on Vascular Findings. J Am Coll Radiol 2013; 10:789-794. 5. Moderate hiatal hernia Electronically Signed   By: Donavan Foil M.D.   On: 05/04/2017 21:01   US Venous Img Upper Uni Left  Result Date: 05/26/2017 CLINICAL DATA:  83 year old male with a history left arm swelling EXAM: LEFT UPPER EXTREMITY VENOUS DOPPLER ULTRASOUND TECHNIQUE: Gray-scale sonography with graded compression, as well as color Doppler and duplex ultrasound were performed to evaluate the upper extremity deep venous system from the level of the subclavian vein and including the jugular, axillary, basilic, radial, ulnar and upper cephalic vein. Spectral Doppler was utilized to evaluate flow at rest and with distal augmentation maneuvers. COMPARISON:  None. FINDINGS: Contralateral Subclavian Vein: Respiratory phasicity is normal and symmetric with the symptomatic side. No evidence of thrombus. Normal compressibility. Internal Jugular Vein: No evidence of thrombus. Normal compressibility, respiratory phasicity and response to augmentation. Subclavian Vein: No evidence of thrombus. Normal compressibility, respiratory phasicity and response to augmentation. Axillary Vein: No evidence of thrombus. Normal compressibility, respiratory phasicity and response to augmentation. Cephalic Vein: No evidence of thrombus. Normal compressibility, respiratory phasicity and response to augmentation. Basilic Vein: No evidence of thrombus. Normal compressibility, respiratory phasicity and response to augmentation. Brachial Veins: No evidence of thrombus. Normal compressibility, respiratory phasicity and response to augmentation. Radial Veins: No evidence  of thrombus. Normal compressibility, respiratory phasicity and response to augmentation. Ulnar Veins: No evidence of thrombus. Normal compressibility, respiratory phasicity and response to augmentation. Other Findings:  None visualized. IMPRESSION: Sonographic survey of the left upper extremity negative for DVT. Electronically Signed   By: Corrie Mckusick D.O.   On: 05/26/2017 12:38   Dg Chest Port 1 View  Result Date: 05/22/2017 CLINICAL DATA:  Shortness of breath. EXAM: PORTABLE CHEST 1 VIEW COMPARISON:  04/29/2017. FINDINGS: AICD in stable position. Stable cardiomegaly. Stable bilateral interstitial prominence consistent chronic interstitial lung disease. Stable right pleural thickening consistent scarring. Prior cardiac valve replacement. No acute bony abnormality. IMPRESSION: 1. Stable changes of chronic interstitial lung disease and right-sided pleural scarring. 2. AICD in stable position. Prior cardiac valve replacement. Stable cardiomegaly. Electronically Signed   By: Marcello Moores  Register   On: 05/22/2017 10:23     CBC Recent Labs  Lab 05/22/17 1003 05/23/17 0424 05/24/17 0446 05/25/17 0447  WBC 7.8 7.6 6.4 16.1*  HGB 14.3 13.5 13.3 13.1  HCT 42.7 39.8* 39.9* 38.4*  PLT 183 165 162 160  MCV 90.7 91.0 90.9 89.6  MCH 30.4 30.9 30.3 30.6  MCHC 33.5 33.9 33.3 34.2  RDW 15.4* 15.2* 15.2* 15.2*    Chemistries  Recent Labs  Lab 05/22/17 1003 05/23/17 0424 05/24/17 0446 05/25/17 0447  NA 135 135 133* 133*  K 4.6 4.5 4.9 5.2*  CL 100* 100* 98* 101  CO2 28 28 28 25   GLUCOSE 120* 137* 119* 181*  BUN 18 16 17  28*  CREATININE 0.88 0.85 0.92 1.04  CALCIUM 9.0 9.1 9.0 8.7*  AST 30  --   --   --   ALT 19  --   --   --  ALKPHOS 77  --   --   --   BILITOT 0.6  --   --   --    ------------------------------------------------------------------------------------------------------------------ estimated creatinine clearance is 58.5 mL/min (by C-G formula based on SCr of 1.04  mg/dL). ------------------------------------------------------------------------------------------------------------------ No results for input(s): HGBA1C in the last 72 hours. ------------------------------------------------------------------------------------------------------------------ No results for input(s): CHOL, HDL, LDLCALC, TRIG, CHOLHDL, LDLDIRECT in the last 72 hours. ------------------------------------------------------------------------------------------------------------------ No results for input(s): TSH, T4TOTAL, T3FREE, THYROIDAB in the last 72 hours.  Invalid input(s): FREET3 ------------------------------------------------------------------------------------------------------------------ No results for input(s): VITAMINB12, FOLATE, FERRITIN, TIBC, IRON, RETICCTPCT in the last 72 hours.  Coagulation profile No results for input(s): INR, PROTIME in the last 168 hours.  No results for input(s): DDIMER in the last 72 hours.  Cardiac Enzymes Recent Labs  Lab 05/26/17 1629 05/26/17 2213 05/27/17 1156  TROPONINI 0.41* 0.42* 0.26*   ------------------------------------------------------------------------------------------------------------------ Invalid input(s): POCBNP    Assessment & Plan   Wade Pilz  is a 83 y.o. male with a known history of cardiomyopathy with EF of 45%, COPD on 2 L home oxygen, remote history of smoking, paroxysmal A. fib, hyperlipidemia, valvular heart disease, benign prostatic hypertrophy requiring self-catheterization twice a day presents to hospital secondary to worsening weakness and sleepiness.  1.  Shortness of breath Multifactorial including chronic pulmonary embolism: Continue oral Eliquis Chronic underlying interstitial lung disease Start prednisone taper Oral doxycycline  2.  Acute cystitis-urine culture showing 70,000 MRSA bacteria unclear clinical significance Oral doxycycline  3.  Paroxysmal atrial fibrillation-continue  digoxin Rate well controlled.  Patient is status post AICD and pacemaker -Follows with outpatient cardiologist.  On aspirin for anticoagulation.  4.    Left-sided chest pain with elevated troponin due to demand ischemia Radiology recommended no further treatment Continue to monitor for any other symptoms  5.  BPH-with urinary retention, status post self catheterizations.  Will continue  6.  Chronic hypotension-continue hydrocortisone and midodrine  7  DVT prophylaxis-Lovenox          Code Status Orders  (From admission, onward)        Start     Ordered   05/22/17 1610  Do not attempt resuscitation (DNR)  Continuous    Question Answer Comment  In the event of cardiac or respiratory ARREST Do not call a "code blue"   In the event of cardiac or respiratory ARREST Do not perform Intubation, CPR, defibrillation or ACLS   In the event of cardiac or respiratory ARREST Use medication by any route, position, wound care, and other measures to relive pain and suffering. May use oxygen, suction and manual treatment of airway obstruction as needed for comfort.      05/22/17 1609    Code Status History    Date Active Date Inactive Code Status Order ID Comments User Context   11/17/2016 1631 11/20/2016 1602 DNR [id number]  Vaughan Basta, MD Inpatient   08/19/2016 1315 08/30/2016 1728 DNR [id number]  Harrie Foreman, MD Inpatient   03/02/2016 2236 03/05/2016 2020 DNR [id number]  Mikael Spray, NP ED   01/09/2016 0658 01/14/2016 1939 DNR [id number]  Harrie Foreman, MD Inpatient   11/09/2015 0318 11/12/2015 1713 DNR [id number]  Harrie Foreman, MD Inpatient   06/14/2015 1639 06/18/2015 1513 DNR [id number]  Demetrios Loll, MD Inpatient    Advance Directive Documentation     Most Recent Value  Type of Advance Directive  Healthcare Power of Attorney, Living will, Out of facility DNR (pink MOST or yellow form)  Pre-existing out of facility  DNR order (yellow form or pink MOST form)   Pink MOST form placed in chart (order not valid for inpatient use)  "MOST" Form in Place?  -           Consults  none  DVT Prophylaxis  Lovenox   Lab Results  Component Value Date   PLT 160 05/25/2017     Time Spent in minutes   65min  Greater than 50% of time spent in care coordination and counseling patient regarding the condition and plan of care.   Dustin Flock M.D on 05/27/2017 at 2:25 PM  Between 7am to 6pm - Pager - [phone]  After 6pm go to [email or web address] - Proofreader  [employer]   Office  [phone]

## 2017-05-28 LAB — BASIC METABOLIC PANEL
Anion gap: 5 (ref 5–15)
BUN: 43 mg/dL — AB (ref 6–20)
CALCIUM: 8.9 mg/dL (ref 8.9–10.3)
CO2: 28 mmol/L (ref 22–32)
CREATININE: 0.82 mg/dL (ref 0.61–1.24)
Chloride: 102 mmol/L (ref 101–111)
GFR calc non Af Amer: >60 mL/min (ref >60)
Glucose, Bld: 101 mg/dL — ABNORMAL HIGH (ref 65–99)
Potassium: 5 mmol/L (ref 3.5–5.1)
SODIUM: 135 mmol/L (ref 135–145)

## 2017-05-28 LAB — CBC
HCT: 39 % — ABNORMAL LOW (ref 40.0–52.0)
Hemoglobin: 12.9 g/dL — ABNORMAL LOW (ref 13.0–18.0)
MCH: 29.9 pg (ref 26.0–34.0)
MCHC: 33.2 g/dL (ref 32.0–36.0)
MCV: 90.1 fL (ref 80.0–100.0)
Platelets: 176 10*3/uL (ref 150–440)
RBC: 4.33 MIL/uL — ABNORMAL LOW (ref 4.40–5.90)
RDW: 15.2 % — AB (ref 11.5–14.5)
WBC: 15.1 10*3/uL — ABNORMAL HIGH (ref 3.8–10.6)

## 2017-05-28 MED ORDER — SODIUM CHLORIDE 0.9% FLUSH
3.0000 mL | Freq: Two times a day (BID) | INTRAVENOUS | Status: DC
  Administered 2017-05-28: 3 mL via INTRAVENOUS

## 2017-05-28 MED ORDER — SODIUM CHLORIDE 0.9% FLUSH
3.0000 mL | Freq: Two times a day (BID) | INTRAVENOUS | Status: DC
  Administered 2017-05-28 (×2): 3 mL via INTRAVENOUS

## 2017-05-28 MED ORDER — SODIUM CHLORIDE 0.9 % IV SOLN
INTRAVENOUS | Status: DC
  Administered 2017-05-28 – 2017-05-29 (×2): via INTRAVENOUS

## 2017-05-28 MED ORDER — IPRATROPIUM-ALBUTEROL 0.5-2.5 (3) MG/3ML IN SOLN
3.0000 mL | Freq: Two times a day (BID) | RESPIRATORY_TRACT | Status: DC
  Filled 2017-05-28 (×2): qty 3

## 2017-05-28 NOTE — Consult Note (Signed)
ANTICOAGULATION CONSULT NOTE - Initial Consult  Pharmacy Consult for apixaban Indication: atrial fibrillation and pulmonary embolus  Allergies  Allergen Reactions  . Penicillin G Hives    Has patient had a PCN reaction causing immediate rash, facial/tongue/throat swelling, SOB or lightheadedness with hypotension: Yes Has patient had a PCN reaction causing severe rash involving mucus membranes or skin necrosis: No Has patient had a PCN reaction that required hospitalization No Has patient had a PCN reaction occurring within the last 10 years: No If all of the above answers are "NO", then may proceed with Cephalosporin use.  . Sulfa Antibiotics Rash    Patient Measurements: Height: 5\' 10"  (177.8 cm) Weight: 179 lb 14.3 oz (81.6 kg) IBW/kg (Calculated) : 73 Heparin Dosing Weight:   Vital Signs: Temp: 98.1 F (36.7 C) (04/25 0800) Temp Source: Oral (04/25 0513) BP: 117/60 (04/25 0800) Pulse Rate: 101 (04/25 1113)  Labs: Recent Labs    05/26/17 1629 05/26/17 2213 05/27/17 1156 05/28/17 0456  HGB  --   --   --  12.9*  HCT  --   --   --  39.0*  PLT  --   --   --  176  CREATININE  --   --   --  0.82  TROPONINI 0.41* 0.42* 0.26*  --     Estimated Creatinine Clearance: 74.2 mL/min (by C-G formula based on SCr of 0.82 mg/dL).   Medical History: Past Medical History:  Diagnosis Date  . Acquired hypothyroidism 11/02/2014  . BPH (benign prostatic hyperplasia)   . Cardiac defibrillator in place 11/02/2014  . Cardiomyopathy ([place])   . CHF (congestive heart failure) ([place])   . Chronic diastolic heart failure ([place]) 11/02/2014  . COPD (chronic obstructive pulmonary disease) ([place])   . Gastro-esophageal reflux disease without esophagitis 11/02/2014  . H/O malignant neoplasm 11/09/2014  . Heart valve disease 11/02/2014  . History of atrial fibrillation 11/02/2014  . Lung cancer ([place])   . Neuropathy 11/02/2014  . Orthostasis 11/02/2014  . Pure hypercholesterolemia 11/02/2014  . Valvular  heart disease     Medications:  Scheduled:  . acidophilus  1 capsule Oral QPM  . apixaban  10 mg Oral BID   Followed by  . [START ON 05/31/2017] apixaban  5 mg Oral BID  . aspirin EC  81 mg Oral Daily  . benzonatate  200 mg Oral TID  . budesonide (PULMICORT) nebulizer solution  0.25 mg Nebulization BID  . cholecalciferol  5,000 Units Oral Daily  . digoxin  0.125 mg Oral Daily  . docusate sodium  100 mg Oral BID  . doxycycline  100 mg Oral Q12H  . famotidine  20 mg Oral Daily  . fluticasone  2 spray Each Nare Daily  . gabapentin  600 mg Oral BID  . guaiFENesin  600 mg Oral BID  . ipratropium-albuterol  3 mL Nebulization BID  . levothyroxine  100 mcg Oral QAC breakfast  . magnesium oxide  400 mg Oral QPM  . methenamine  1,000 mg Oral BID WC  . midodrine  10 mg Oral TID  . multivitamin with minerals  1 tablet Oral Daily  . pantoprazole  40 mg Oral QAC breakfast  . [START ON 05/29/2017] predniSONE  50 mg Oral Q breakfast   Followed by  . [START ON 05/30/2017] predniSONE  40 mg Oral Q breakfast   Followed by  . [START ON 05/31/2017] predniSONE  30 mg Oral Q breakfast   Followed by  . [START ON 06/01/2017]  predniSONE  20 mg Oral Q breakfast   Followed by  . [START ON 06/02/2017] predniSONE  10 mg Oral Q breakfast  . pyridostigmine  60 mg Oral BID  . senna  1 tablet Oral BID  . simvastatin  20 mg Oral QPM  . sodium chloride flush  3 mL Intravenous Q12H  . sodium chloride flush  3 mL Intravenous Q12H  . vitamin B-12  500 mcg Oral Daily  . vitamin C  500 mg Oral BID  . vitamin E  400 Units Oral QPM    Assessment: Patient is a 83 year old male with PMH of afib, CHF, lung cancer, valvular heart disease, COPD with a chronic right lung PE.   Goal of Therapy:   Monitor platelets by anticoagulation protocol: Yes   Plan:  Continue apixaban 10 mg bid with transition to 5 mg bid after 7 days.   [name], PharmD Clinical Pharmacist   05/28/2017,12:04 PM

## 2017-05-28 NOTE — Evaluation (Signed)
Physical Therapy Evaluation Patient Details Name: Russell Howard MRN: [id number] DOB: 04-21-1934 Today's Date: 05/28/2017   History of Present Illness  Pt is an28 y.o. malewith a known history of cardiomyopathy with EF of 45%, COPD on 2 L home oxygen, remote history of smoking, paroxysmal A. fib, hyperlipidemia, valvular heart disease, benign prostatic hypertrophy requiring self-catheterization twice a day presents to hospital secondary to worsening weakness and sleepiness.  Assessment includes: SOB with chronic PE, acute cystitis, A-fib, L-sided chest pain with elevated troponin due to demand ischemia, and chronic hypotension.      Clinical Impression  Pt presents with deficits in strength, transfers, mobility, gait, balance, and activity tolerance.  Pt functionally weak globally and required extra time and effort with all functional mobility tasks along with frequent therapeutic rest breaks.  Pt's SpO2 on 3LO2/min was 100% with HR 82 bpm with pt on 2LO2/min at home.  Nursing notified and gave OK to drop O2 to 2LO2/min.  Pt's SpO2 remained >/= 95% on 2LO2/min during therex, bed mobility, and transfers but after limited amb SpO2 dropped to 86% and pt was returned to 3LO2/min with SpO2 quickly returning to mid 90s.  Pt will benefit from PT services in a SNF setting upon discharge to safely address above deficits for decreased caregiver assistance and eventual return to PLOF.      Follow Up Recommendations SNF;Supervision for mobility/OOB    Equipment Recommendations  Other (comment)(TBD at next venue of care if discharges to a SNF)    Recommendations for Other Services       Precautions / Restrictions Precautions Precautions: Fall;ICD/Pacemaker Restrictions Weight Bearing Restrictions: No      Mobility  Bed Mobility Overal bed mobility: Needs Assistance Bed Mobility: Supine to Sit;Sit to Supine     Supine to sit: Min assist Sit to supine: Min assist      Transfers Overall  transfer level: Needs assistance Equipment used: Rolling walker (2 wheeled) Transfers: Sit to/from Stand Sit to Stand: From elevated surface;Min guard         General transfer comment: Extra time and effort required  Ambulation/Gait Ambulation/Gait assistance: Min guard Ambulation Distance (Feet): 1 Feet Assistive device: Rolling walker (2 wheeled) Gait Pattern/deviations: Step-to pattern;Trunk flexed;Decreased step length - right;Decreased step length - left     General Gait Details: 2-3 very small steps at EOB with pt then requiring to sit secondary to fatigue  Stairs            Wheelchair Mobility    Modified Rankin (Stroke Patients Only)       Balance Overall balance assessment: Needs assistance Sitting-balance support: Bilateral upper extremity supported;Feet supported Sitting balance-Leahy Scale: Fair     Standing balance support: Bilateral upper extremity supported Standing balance-Leahy Scale: Fair                               Pertinent Vitals/Pain Pain Assessment: No/denies pain    Home Living Family/patient expects to be discharged to:: Private residence Living Arrangements: Children;Other relatives Available Help at Discharge: Family;Available 24 hours/day Type of Home: House Home Access: Ramped entrance     Home Layout: Two level;Able to live on main level with bedroom/bathroom Home Equipment: Wheelchair - [employer] - 4 wheels;Shower seat;Toilet riser      Prior Function Level of Independence: Needs assistance   Gait / Transfers Assistance Needed: Limited household ambulator with rollator with SBA from family for safety, no fall history  ADL's /  Homemaking Assistance Needed: Requires assist for bathing and dressing from daughter in law, daughter in law provides set up for self-cath (pt does 2x/day) and manages medications, meals, cleaning  Comments: w/c used for community access     Hand Dominance   Dominant Hand:  Right    Extremity/Trunk Assessment   Upper Extremity Assessment Upper Extremity Assessment: Generalized weakness    Lower Extremity Assessment Lower Extremity Assessment: Generalized weakness       Communication   Communication: No difficulties  Cognition Arousal/Alertness: Awake/alert Behavior During Therapy: WFL for tasks assessed/performed Overall Cognitive Status: Within Functional Limits for tasks assessed                                        General Comments      Exercises Total Joint Exercises Ankle Circles/Pumps: AROM;Both;10 reps Quad Sets: Strengthening;Both;10 reps Gluteal Sets: Strengthening;Both;10 reps Heel Slides: AROM;Both;5 reps Long Arc Quad: AROM;Both;10 reps Knee Flexion: AROM;Both;10 reps   Assessment/Plan    PT Assessment Patient needs continued PT services  PT Problem List Decreased strength;Decreased activity tolerance;Decreased mobility       PT Treatment Interventions DME instruction;Gait training;Functional mobility training;Balance training;Therapeutic exercise;Therapeutic activities;Patient/family education    PT Goals (Current goals can be found in the Care Plan section)  Acute Rehab PT Goals Patient Stated Goal: Improved strength and endurance PT Goal Formulation: With patient Time For Goal Achievement: 06/10/17 Potential to Achieve Goals: Fair    Frequency Min 2X/week   Barriers to discharge Inaccessible home environment;Decreased caregiver support      Co-evaluation               AM-PAC PT "6 Clicks" Daily Activity  Outcome Measure Difficulty turning over in bed (including adjusting bedclothes, sheets and blankets)?: Unable Difficulty moving from lying on back to sitting on the side of the bed? : Unable Difficulty sitting down on and standing up from a chair with arms (e.g., wheelchair, bedside commode, etc,.)?: Unable Help needed moving to and from a bed to chair (including a wheelchair)?: A  Lot Help needed walking in hospital room?: Total Help needed climbing 3-5 steps with a railing? : Total 6 Click Score: 7    End of Session Equipment Utilized During Treatment: Gait belt;Oxygen Activity Tolerance: Patient limited by fatigue Patient left: in bed;with bed alarm set;with call bell/phone within reach Nurse Communication: Mobility status PT Visit Diagnosis: Difficulty in walking, not elsewhere classified (R26.2);Muscle weakness (generalized) (M62.81)    Time: 3491-7915 PT Time Calculation (min) (ACUTE ONLY): 32 min   Charges:   PT Evaluation $PT Eval Low Complexity: 1 Low PT Treatments $Therapeutic Exercise: 8-22 mins   PT G Codes:        DRoyetta Asal PT, DPT 05/28/17, 1:30 PM

## 2017-05-28 NOTE — Progress Notes (Signed)
OT Cancellation Note  Patient Details Name: Eamonn Sermeno MRN: [id number] DOB: [birth year]   Cancelled Treatment:    Reason Eval/Treat Not Completed: Fatigue/lethargy limiting ability to participate. Upon attempt this afternoon, daughter in law and grandson in room visiting, pt reporting being extremely tired and politely declines OT session at this time. DIL and pt appreciative of attempt, but prefer pt rest. Will re-attempt as appropriate.   Jeni Salles, MPH, MS, OTR/L ascom [phone] 05/28/17, 3:36 PM

## 2017-05-28 NOTE — Progress Notes (Signed)
[place] [facility]                                                                                                                                                                                  Patient Demographics   Russell Howard, is a 83 y.o. male, DOB - 10-Aug-1934, RSW:[id number]  Admit date - 05/22/2017   Admitting Physician Gladstone Lighter, MD  Outpatient Primary MD for the patient is Doy Hutching Leonie Douglas, MD   LOS - 6  Subjective: The patient has generalized weakness and eels drowsy.  On O2 [place] 3 L.  Review of Systems:   CONSTITUTIONAL: No documented fever.  Has generalized weakness. No weight gain, no weight loss.  EYES: No blurry or double vision.  ENT: No tinnitus. No postnasal drip. No redness of the oropharynx.  RESPIRATORY: +cough, no wheeze, no hemoptysis. + dyspnea.  CARDIOVASCULAR: Positive chest pain. No orthopnea. No palpitations. No syncope.  GASTROINTESTINAL: No nausea, no vomiting or diarrhea. No abdominal pain. No melena or hematochezia.  GENITOURINARY: No dysuria or hematuria.  ENDOCRINE: No polyuria or nocturia. No heat or cold intolerance.  HEMATOLOGY: No anemia. No bruising. No bleeding.  INTEGUMENTARY: No rashes. No lesions.  MUSCULOSKELETAL: No arthritis. No swelling. No gout.  NEUROLOGIC: No numbness, tingling, or ataxia. No seizure-type activity.  PSYCHIATRIC: No anxiety. No insomnia. No ADD.    Vitals:   Vitals:   05/28/17 0513 05/28/17 0800 05/28/17 1113 05/28/17 1314  BP: 116/70 117/60    Pulse: 79 72 (!) 101 82  Resp:  18    Temp: (!) 97.5 F (36.4 C) 98.1 F (36.7 C)    TempSrc: Oral     SpO2: 97% 97%  100%  Weight:      Height:        Wt Readings from Last 3 Encounters:  05/22/17 179 lb 14.3 oz (81.6 kg)  05/04/17 170 lb (77.1 kg)  04/29/17 170 lb (77.1 kg)     Intake/Output Summary (Last 24 hours) at 05/28/2017 1654 Last data filed at 05/28/2017 0515 Gross per 24 hour  Intake -  Output 1300 ml   Net -1300 ml    Physical Exam:   GENERAL: Pleasant-appearing in no apparent distress.  HEAD, EYES, EARS, NOSE AND THROAT: Atraumatic, normocephalic. Extraocular muscles are intact. Pupils equal and reactive to light. Sclerae anicteric. No conjunctival injection. No oro-pharyngeal erythema.  NECK: Supple. There is no jugular venous distention. No bruits, no lymphadenopathy, no thyromegaly.  HEART: Regular rate and rhythm,. No murmurs, no rubs, no clicks.  LUNGS: Clear to auscultation bilaterally. No rales or rhonchi. No wheezes.  ABDOMEN: Soft, flat, nontender, nondistended. Has good bowel  sounds. No hepatosplenomegaly appreciated.  EXTREMITIES: No evidence of any cyanosis, clubbing, or peripheral edema.  +2 pedal and radial pulses bilaterally.  NEUROLOGIC: The patient is alert, awake, and oriented x3 with no focal motor or sensory deficits appreciated bilaterally.  SKIN: Moist and warm with no rashes appreciated.  Psych: Not anxious, depressed LN: No inguinal LN enlargement    Antibiotics   Anti-infectives (From admission, onward)   Start     Dose/Rate Route Frequency Ordered Stop   05/26/17 2200  doxycycline (VIBRA-TABS) tablet 100 mg     100 mg Oral Every 12 hours 05/26/17 1417 05/28/17 2359   05/24/17 1600  doxycycline (VIBRAMYCIN) 100 mg in sodium chloride 0.9 % 250 mL IVPB  Status:  Discontinued     100 mg 125 mL/hr over 120 Minutes Intravenous Every 12 hours 05/24/17 1323 05/26/17 1417   05/23/17 1000  levofloxacin (LEVAQUIN) IVPB 750 mg  Status:  Discontinued     750 mg 100 mL/hr over 90 Minutes Intravenous Every 24 hours 05/22/17 1338 05/22/17 1504   05/23/17 1000  levofloxacin (LEVAQUIN) IVPB 750 mg  Status:  Discontinued     750 mg 100 mL/hr over 90 Minutes Intravenous Every 24 hours 05/22/17 1504 05/24/17 1321   05/22/17 1800  aztreonam (AZACTAM) 2 g in sodium chloride 0.9 % 100 mL IVPB  Status:  Discontinued     2 g 200 mL/hr over 30 Minutes Intravenous Every 8 hours  05/22/17 1338 05/22/17 1504   05/22/17 1800  vancomycin (VANCOCIN) IVPB 1000 mg/200 mL premix  Status:  Discontinued     1,000 mg 200 mL/hr over 60 Minutes Intravenous Every 12 hours 05/22/17 1338 05/22/17 1504   05/22/17 1000  levofloxacin (LEVAQUIN) IVPB 750 mg     750 mg 100 mL/hr over 90 Minutes Intravenous  Once 05/22/17 0949 05/22/17 1317   05/22/17 1000  aztreonam (AZACTAM) 2 g in sodium chloride 0.9 % 100 mL IVPB     2 g 200 mL/hr over 30 Minutes Intravenous  Once 05/22/17 0949 05/22/17 1135   05/22/17 1000  vancomycin (VANCOCIN) IVPB 1000 mg/200 mL premix     1,000 mg 200 mL/hr over 60 Minutes Intravenous  Once 05/22/17 0949 05/22/17 1317      Medications   Scheduled Meds: . acidophilus  1 capsule Oral QPM  . apixaban  10 mg Oral BID   Followed by  . [START ON 05/31/2017] apixaban  5 mg Oral BID  . aspirin EC  81 mg Oral Daily  . benzonatate  200 mg Oral TID  . budesonide (PULMICORT) nebulizer solution  0.25 mg Nebulization BID  . cholecalciferol  5,000 Units Oral Daily  . digoxin  0.125 mg Oral Daily  . docusate sodium  100 mg Oral BID  . doxycycline  100 mg Oral Q12H  . famotidine  20 mg Oral Daily  . fluticasone  2 spray Each Nare Daily  . gabapentin  600 mg Oral BID  . guaiFENesin  600 mg Oral BID  . ipratropium-albuterol  3 mL Nebulization BID  . levothyroxine  100 mcg Oral QAC breakfast  . magnesium oxide  400 mg Oral QPM  . methenamine  1,000 mg Oral BID WC  . midodrine  10 mg Oral TID  . multivitamin with minerals  1 tablet Oral Daily  . pantoprazole  40 mg Oral QAC breakfast  . [START ON 05/29/2017] predniSONE  50 mg Oral Q breakfast   Followed by  . [START ON 05/30/2017] predniSONE  40 mg Oral Q breakfast   Followed by  . [START ON 05/31/2017] predniSONE  30 mg Oral Q breakfast   Followed by  . [START ON 06/01/2017] predniSONE  20 mg Oral Q breakfast   Followed by  . [START ON 06/02/2017] predniSONE  10 mg Oral Q breakfast  . pyridostigmine  60 mg Oral  BID  . senna  1 tablet Oral BID  . simvastatin  20 mg Oral QPM  . sodium chloride flush  3 mL Intravenous Q12H  . sodium chloride flush  3 mL Intravenous Q12H  . vitamin B-12  500 mcg Oral Daily  . vitamin C  500 mg Oral BID  . vitamin E  400 Units Oral QPM   Continuous Infusions:  PRN Meds:.acetaminophen **OR** acetaminophen, guaiFENesin-codeine, ipratropium-albuterol, ketorolac, morphine injection, ondansetron **OR** ondansetron (ZOFRAN) IV, promethazine   Data Review:   Micro Results Recent Results (from the past 240 hour(s))  Blood Culture (routine x 2)     Status: None   Collection Time: 05/22/17 10:03 AM  Result Value Ref Range Status   Specimen Description BLOOD RIGHT FA  Final   Special Requests   Final    BOTTLES DRAWN AEROBIC AND ANAEROBIC Blood Culture adequate volume   Culture   Final    NO GROWTH 5 DAYS Performed at [facility], [address] [place], [place] [address]    Report Status 05/27/2017 FINAL  Final  Blood Culture (routine x 2)     Status: None   Collection Time: 05/22/17 10:04 AM  Result Value Ref Range Status   Specimen Description BLOOD RIGHT [facility]  Final   Special Requests   Final    BOTTLES DRAWN AEROBIC AND ANAEROBIC Blood Culture adequate volume   Culture   Final    NO GROWTH 5 DAYS Performed at [facility], [address] [place], [place] [address]    Report Status 05/27/2017 FINAL  Final  Urine Culture     Status: Abnormal   Collection Time: 05/22/17  3:11 PM  Result Value Ref Range Status   Specimen Description   Final    URINE, RANDOM Performed at [facility], [address] [place],  [address]    Special Requests   Final    NONE Performed at [facility], [address] [place],  [address]    Culture (A)  Final    70,000 COLONIES/mL METHICILLIN RESISTANT STAPHYLOCOCCUS AUREUS   Report Status 05/24/2017 FINAL  Final   Organism ID, Bacteria METHICILLIN RESISTANT  STAPHYLOCOCCUS AUREUS (A)  Final      Susceptibility   Methicillin resistant staphylococcus aureus - MIC*    CIPROFLOXACIN >=8 RESISTANT Resistant     GENTAMICIN <=0.5 SENSITIVE Sensitive     NITROFURANTOIN <=16 SENSITIVE Sensitive     OXACILLIN >=4 RESISTANT Resistant     TETRACYCLINE <=1 SENSITIVE Sensitive     VANCOMYCIN <=0.5 SENSITIVE Sensitive     TRIMETH/SULFA <=10 SENSITIVE Sensitive     CLINDAMYCIN <=0.25 SENSITIVE Sensitive     RIFAMPIN <=0.5 SENSITIVE Sensitive     Inducible Clindamycin NEGATIVE Sensitive     * 70,000 COLONIES/mL METHICILLIN RESISTANT STAPHYLOCOCCUS AUREUS    Radiology Reports Dg Chest 2 View  Result Date: 04/29/2017 CLINICAL DATA:  Cough and shortness of Breath EXAM: CHEST - 2 VIEW COMPARISON:  03/27/2017 FINDINGS: Cardiac shadow is stable. Defibrillator is again noted. Postsurgical changes are seen. The left lung is clear with mild interstitial changes. Right lung again  demonstrates diffuse chronic pleuroparenchymal changes similar to that seen on the prior exam. No acute abnormality is noted. IMPRESSION: Chronic changes without acute abnormality. Electronically Signed   By: Inez Catalina M.D.   On: 04/29/2017 15:53   Ct Head Wo Contrast  Result Date: 05/22/2017 CLINICAL DATA:  Increasing weakness. Altered level of consciousness. EXAM: CT HEAD WITHOUT CONTRAST TECHNIQUE: Contiguous axial images were obtained from the base of the skull through the vertex without intravenous contrast. COMPARISON:  02/25/2017 FINDINGS: Brain: There is atrophy and chronic small vessel disease changes. No acute intracranial abnormality. Specifically, no hemorrhage, hydrocephalus, mass lesion, acute infarction, or significant intracranial injury. Vascular: No hyperdense vessel or unexpected calcification. Skull: No acute calvarial abnormality. Sinuses/Orbits: Mucosal thickening throughout the paranasal sinuses. Mastoid air cells are clear. Orbital soft tissues unremarkable. Other: None  IMPRESSION: No acute intracranial abnormality. Atrophy, chronic microvascular disease. Electronically Signed   By: Rolm Baptise M.D.   On: 05/22/2017 11:07   Ct Chest W Contrast  Result Date: 05/23/2017 CLINICAL DATA:  Cough and congestion worsening for few days. Shortness of breath. History of COPD, cardiomyopathy, lung cancer. EXAM: CT CHEST WITH CONTRAST TECHNIQUE: Multidetector CT imaging of the chest was performed during intravenous contrast administration. CONTRAST:  60mL ISOVUE-300 IOPAMIDOL (ISOVUE-300) INJECTION 61% COMPARISON:  Chest radiograph April 19th 2019 and CT chest November 10, 2015 and CT chest September 07, 2015 FINDINGS: CARDIOVASCULAR: The heart is mildly enlarged and unchanged. No pericardial effusion. Severe coronary artery calcifications. Thoracic aorta is normal course and caliber with moderate calcific atherosclerosis. large mural based filling defect RIGHT main pulmonary artery narrowing the lumen by at least 50%. Propagation of soft tissue into RIGHT upper lobe pulmonary artery with partial occlusion. MEDIASTINUM/NODES: Similar mediastinal, worsening hilar lymphadenopathy. LEFT AICD. LUNGS/PLEURA: Similar RIGHT lung bronchiectasis, with bilateral subpleural fibrosis. Faint ground-glass nodules LEFT lower lobe new from prior CT. LEFT calcified granuloma. Stable moderate RIGHT pleural effusion. UPPER ABDOMEN: Nonacute. Small hiatal hernia. Mildly hypodense liver likely reflecting hepatic steatosis. MUSCULOSKELETAL: Nonacute. Osteopenia and mild degenerative change of the thoracic spine. IMPRESSION: 1. Large chronic RIGHT pulmonary embolism occlusive in RIGHT upper lobe. At least 50% stenosis RIGHT main pulmonary artery. 2. Ground-glass centrilobular nodules LEFT lower lobe may be infectious or inflammatory. Mildly worsening hilar lymphadenopathy. 3. Similar extensive fibrosis with bronchiectasis seen with chronic interstitial lung disease. 4. Chronic RIGHT moderate pleural effusion. 5. Acute  findings discussed with and reconfirmed by Dr.SHREYANG PATEL on 05/23/2017 at 2:44 pm. Electronically Signed   By: Elon Alas M.D.   On: 05/23/2017 14:47   Ct Abdomen Pelvis W Contrast  Result Date: 05/04/2017 CLINICAL DATA:  Constipation, nausea EXAM: CT ABDOMEN AND PELVIS WITH CONTRAST TECHNIQUE: Multidetector CT imaging of the abdomen and pelvis was performed using the standard protocol following bolus administration of intravenous contrast. CONTRAST:  133mL ISOVUE-300 IOPAMIDOL (ISOVUE-300) INJECTION 61% COMPARISON:  03/24/2017, 08/28/2016, 01/13/2016 FINDINGS: Lower chest: Chronic rim enhancing pleural effusion at the right base. Right greater than left lung base fibrosis, similar pattern compared to prior. Volume loss on the right with shift of mediastinal contents to the right. Partially visualized cardiac pacing leads. Borderline cardiomegaly. Moderate hiatal hernia. Hepatobiliary: No focal hepatic abnormality. Calcified gallstone. No biliary dilatation Pancreas: Unremarkable. No pancreatic ductal dilatation or surrounding inflammatory changes. Spleen: Normal in size without focal abnormality. Adrenals/Urinary Tract: Adrenal glands are within normal limits. Kidneys show no hydronephrosis. 9 mm posterior and 7 mm anterior mid right renal indeterminate lesions are again noted. Urinary bladder slightly lobulated in  contour but without focal wall thickening. Stomach/Bowel: Stomach is nonenlarged. Fluid-filled distal small bowel loops. Diffuse liquid stools throughout the colon without wall thickening. Moderate amount of formed stools within the rectosigmoid colon. Vascular/Lymphatic: Moderate aortic atherosclerosis. Ectatic infrarenal abdominal aorta up to 2.7 cm. No significantly enlarged lymph nodes. Reproductive: Prostate calcifications. Slightly enlarged prostate gland. Fluid versus high-riding testicle in the right inguinal region. Other: Negative for free air or free fluid. Musculoskeletal:  Degenerative changes of the spine. No acute or suspicious abnormality. IMPRESSION: 1. Diffuse liquid stools throughout the colon but without colon wall thickening. Moderate formed stools within the rectosigmoid colon, suggesting constipation. 2. Chronic rim enhancing and loculated right lung base pleural effusion. Right greater than left lung base pulmonary fibrosis without significant interval change 3. Gallstone 4. Diffuse aortic atherosclerosis with ectatic infrarenal abdominal aorta up to 2.7 cm. Ectatic abdominal aorta at risk for aneurysm development. Recommend followup by ultrasound in 5 years. This recommendation follows ACR consensus guidelines: White Paper of the ACR Incidental Findings Committee II on Vascular Findings. J Am Coll Radiol 2013; 10:789-794. 5. Moderate hiatal hernia Electronically Signed   By: Donavan Foil M.D.   On: 05/04/2017 21:01   US Venous Img Upper Uni Left  Result Date: 05/26/2017 CLINICAL DATA:  83 year old male with a history left arm swelling EXAM: LEFT UPPER EXTREMITY VENOUS DOPPLER ULTRASOUND TECHNIQUE: Gray-scale sonography with graded compression, as well as color Doppler and duplex ultrasound were performed to evaluate the upper extremity deep venous system from the level of the subclavian vein and including the jugular, axillary, basilic, radial, ulnar and upper cephalic vein. Spectral Doppler was utilized to evaluate flow at rest and with distal augmentation maneuvers. COMPARISON:  None. FINDINGS: Contralateral Subclavian Vein: Respiratory phasicity is normal and symmetric with the symptomatic side. No evidence of thrombus. Normal compressibility. Internal Jugular Vein: No evidence of thrombus. Normal compressibility, respiratory phasicity and response to augmentation. Subclavian Vein: No evidence of thrombus. Normal compressibility, respiratory phasicity and response to augmentation. Axillary Vein: No evidence of thrombus. Normal compressibility, respiratory  phasicity and response to augmentation. Cephalic Vein: No evidence of thrombus. Normal compressibility, respiratory phasicity and response to augmentation. Basilic Vein: No evidence of thrombus. Normal compressibility, respiratory phasicity and response to augmentation. Brachial Veins: No evidence of thrombus. Normal compressibility, respiratory phasicity and response to augmentation. Radial Veins: No evidence of thrombus. Normal compressibility, respiratory phasicity and response to augmentation. Ulnar Veins: No evidence of thrombus. Normal compressibility, respiratory phasicity and response to augmentation. Other Findings:  None visualized. IMPRESSION: Sonographic survey of the left upper extremity negative for DVT. Electronically Signed   By: Corrie Mckusick D.O.   On: 05/26/2017 12:38   Dg Chest Port 1 View  Result Date: 05/22/2017 CLINICAL DATA:  Shortness of breath. EXAM: PORTABLE CHEST 1 VIEW COMPARISON:  04/29/2017. FINDINGS: AICD in stable position. Stable cardiomegaly. Stable bilateral interstitial prominence consistent chronic interstitial lung disease. Stable right pleural thickening consistent scarring. Prior cardiac valve replacement. No acute bony abnormality. IMPRESSION: 1. Stable changes of chronic interstitial lung disease and right-sided pleural scarring. 2. AICD in stable position. Prior cardiac valve replacement. Stable cardiomegaly. Electronically Signed   By: Marcello Moores  Register   On: 05/22/2017 10:23     CBC Recent Labs  Lab 05/22/17 1003 05/23/17 0424 05/24/17 0446 05/25/17 0447 05/28/17 0456  WBC 7.8 7.6 6.4 16.1* 15.1*  HGB 14.3 13.5 13.3 13.1 12.9*  HCT 42.7 39.8* 39.9* 38.4* 39.0*  PLT 183 165 162 160 176  MCV 90.7 91.0  90.9 89.6 90.1  MCH 30.4 30.9 30.3 30.6 29.9  MCHC 33.5 33.9 33.3 34.2 33.2  RDW 15.4* 15.2* 15.2* 15.2* 15.2*    Chemistries  Recent Labs  Lab 05/22/17 1003 05/23/17 0424 05/24/17 0446 05/25/17 0447 05/28/17 0456  NA 135 135 133* 133* 135  K  4.6 4.5 4.9 5.2* 5.0  CL 100* 100* 98* 101 102  CO2 28 28 28 25 28   GLUCOSE 120* 137* 119* 181* 101*  BUN 18 16 17  28* 43*  CREATININE 0.88 0.85 0.92 1.04 0.82  CALCIUM 9.0 9.1 9.0 8.7* 8.9  AST 30  --   --   --   --   ALT 19  --   --   --   --   ALKPHOS 77  --   --   --   --   BILITOT 0.6  --   --   --   --    ------------------------------------------------------------------------------------------------------------------ estimated creatinine clearance is 74.2 mL/min (by C-G formula based on SCr of 0.82 mg/dL). ------------------------------------------------------------------------------------------------------------------ No results for input(s): HGBA1C in the last 72 hours. ------------------------------------------------------------------------------------------------------------------ No results for input(s): CHOL, HDL, LDLCALC, TRIG, CHOLHDL, LDLDIRECT in the last 72 hours. ------------------------------------------------------------------------------------------------------------------ No results for input(s): TSH, T4TOTAL, T3FREE, THYROIDAB in the last 72 hours.  Invalid input(s): FREET3 ------------------------------------------------------------------------------------------------------------------ No results for input(s): VITAMINB12, FOLATE, FERRITIN, TIBC, IRON, RETICCTPCT in the last 72 hours.  Coagulation profile No results for input(s): INR, PROTIME in the last 168 hours.  No results for input(s): DDIMER in the last 72 hours.  Cardiac Enzymes Recent Labs  Lab 05/26/17 1629 05/26/17 2213 05/27/17 1156  TROPONINI 0.41* 0.42* 0.26*   ------------------------------------------------------------------------------------------------------------------ Invalid input(s): POCBNP    Assessment & Plan   Dontrae Swigart  is a 83 y.o. male with a known history of cardiomyopathy with EF of 45%, COPD on 2 L home oxygen, remote history of smoking, paroxysmal A. fib,  hyperlipidemia, valvular heart disease, benign prostatic hypertrophy requiring self-catheterization twice a day presents to hospital secondary to worsening weakness and sleepiness.  1.  Acute on chronic respiratory failure on home oxygen 2 L. Now 3 L. Multifactorial including chronic pulmonary embolism: Continue oral Eliquis Chronic underlying interstitial lung disease Started prednisone taper, pulmonary consult.  2.  Acute cystitis-urine culture showing 70,000 MRSA bacteria unclear clinical significance Oral doxycycline  3.  Paroxysmal atrial fibrillation-continue digoxin Rate well controlled.  Patient is status post AICD and pacemaker -Follows with outpatient cardiologist.  On aspirin and Eliquis.  4.    Left-sided chest pain with elevated troponin due to demand ischemia Dr. Clayborn Bigness recommended no invasive strategy at this point for cardiac assessment.  Treatment as above.  5.  BPH-with urinary retention, status post self catheterizations.  6.  Chronic hypotension-continue hydrocortisone and midodrine  Chronic diastolic CHF.  Stable. Dehydration.  Gentle rehydration with IV fluid and follow-up BMP.       Code Status Orders  (From admission, onward)        Start     Ordered   05/22/17 1610  Do not attempt resuscitation (DNR)  Continuous    Question Answer Comment  In the event of cardiac or respiratory ARREST Do not call a "code blue"   In the event of cardiac or respiratory ARREST Do not perform Intubation, CPR, defibrillation or ACLS   In the event of cardiac or respiratory ARREST Use medication by any route, position, wound care, and other measures to relive pain and suffering. May use oxygen, suction and manual treatment  of airway obstruction as needed for comfort.      05/22/17 1609    Code Status History    Date Active Date Inactive Code Status Order ID Comments User Context   11/17/2016 1631 11/20/2016 1602 DNR [id number]  Vaughan Basta, MD Inpatient    08/19/2016 1315 08/30/2016 1728 DNR [id number]  Harrie Foreman, MD Inpatient   03/02/2016 2236 03/05/2016 2020 DNR [id number]  Mikael Spray, NP ED   01/09/2016 0658 01/14/2016 1939 DNR [id number]  Harrie Foreman, MD Inpatient   11/09/2015 0318 11/12/2015 1713 DNR [id number]  Harrie Foreman, MD Inpatient   06/14/2015 1639 06/18/2015 1513 DNR [id number]  Demetrios Loll, MD Inpatient    Advance Directive Documentation     Most Recent Value  Type of Advance Directive  Healthcare Power of Attorney, Living will, Out of facility DNR (pink MOST or yellow form)  Pre-existing out of facility DNR order (yellow form or pink MOST form)  Pink MOST form placed in chart (order not valid for inpatient use)  "MOST" Form in Place?  -           Consults  none  DVT Prophylaxis  Lovenox   Lab Results  Component Value Date   PLT 176 05/28/2017     Time Spent in minutes   35 min  Greater than 50% of time spent in care coordination and counseling patient regarding the condition and plan of care.   Demetrios Loll M.D on 05/28/2017 at 4:54 PM  Between 7am to 6pm - Pager - [phone]  After 6pm go to [email or web address] - Proofreader  [employer]   Office  [phone]

## 2017-05-29 LAB — BASIC METABOLIC PANEL
ANION GAP: 7 (ref 5–15)
BUN: 39 mg/dL — AB (ref 6–20)
CO2: 29 mmol/L (ref 22–32)
Calcium: 9.1 mg/dL (ref 8.9–10.3)
Chloride: 99 mmol/L — ABNORMAL LOW (ref 101–111)
Creatinine, Ser: 0.69 mg/dL (ref 0.61–1.24)
GFR calc non Af Amer: >60 mL/min (ref >60)
Glucose, Bld: 152 mg/dL — ABNORMAL HIGH (ref 65–99)
POTASSIUM: 4.9 mmol/L (ref 3.5–5.1)
SODIUM: 135 mmol/L (ref 135–145)

## 2017-05-29 MED ORDER — APIXABAN 5 MG PO TABS: ORAL_TABLET | ORAL | 1 refills | Status: DC

## 2017-05-29 MED ORDER — BUDESONIDE 0.25 MG/2ML IN SUSP: 0.2500 mg | Freq: Two times a day (BID) | RESPIRATORY_TRACT | 2 refills | Status: DC

## 2017-05-29 MED ORDER — FLUTICASONE PROPIONATE 50 MCG/ACT NA SUSP: 2.0000 | Freq: Every day | NASAL | 0 refills | Status: DC

## 2017-05-29 MED ORDER — IPRATROPIUM-ALBUTEROL 0.5-2.5 (3) MG/3ML IN SOLN: 3.0000 mL | RESPIRATORY_TRACT | 1 refills | Status: DC | PRN

## 2017-05-29 MED ORDER — BENZONATATE 200 MG PO CAPS: 200.0000 mg | ORAL_CAPSULE | Freq: Three times a day (TID) | ORAL | 0 refills | Status: DC | PRN

## 2017-05-29 NOTE — Care Management Important Message (Signed)
Important Message  Patient Details  Name: Russell Howard MRN: [id number] Date of Birth: May 24, 1934   Medicare Important Message Given:  Yes  Notice given around 12 pm to patient and his daughter  Mathis Bud 05/29/2017, 4:49 PM

## 2017-05-29 NOTE — Discharge Instructions (Signed)
Fall precaution. HHPT. Continue home O2  2L currently followed by out patient PALLIATIVE at home.

## 2017-05-29 NOTE — Progress Notes (Addendum)
Please note patient is currently followed by out patient PALLIATIVE at home. CMRN Joni Reining made aware. Plan is for discharge home today with [place]. Flo Shanks RN, BSN, [place] [facility], hospital Liaison [phone]

## 2017-05-29 NOTE — Discharge Summary (Signed)
[facility] [place] NAME: Russell Howard    MR#:  [id number]  DATE OF BIRTH:  September 02, 1934  DATE OF ADMISSION:  05/22/2017   ADMITTING PHYSICIAN: Gladstone Lighter, MD  DATE OF DISCHARGE: 05/29/2017 PRIMARY CARE PHYSICIAN: [name], MD   ADMISSION DIAGNOSIS:  Weakness [R53.1] Sepsis, due to unspecified organism ([place]) [A41.9] DISCHARGE DIAGNOSIS:  Active Problems:   Sepsis ([place])  SECONDARY DIAGNOSIS:   Past Medical History:  Diagnosis Date  . Acquired hypothyroidism 11/02/2014  . BPH (benign prostatic hyperplasia)   . Cardiac defibrillator in place 11/02/2014  . Cardiomyopathy ([place])   . CHF (congestive heart failure) ([place])   . Chronic diastolic heart failure ([place]) 11/02/2014  . COPD (chronic obstructive pulmonary disease) ([place])   . Gastro-esophageal reflux disease without esophagitis 11/02/2014  . H/O malignant neoplasm 11/09/2014  . Heart valve disease 11/02/2014  . History of atrial fibrillation 11/02/2014  . Lung cancer ([place])   . Neuropathy 11/02/2014  . Orthostasis 11/02/2014  . Pure hypercholesterolemia 11/02/2014  . Valvular heart disease    HOSPITAL COURSE:  HarlDunkinis a83 y.o.malewith a known history of cardiomyopathy with EF of 45%, COPD on 2 L home oxygen, remote history of smoking, paroxysmal A. fib, hyperlipidemia, valvular heart disease, benign prostatic hypertrophy requiring self-catheterization twice a day presents to hospital secondary to worsening weakness and sleepiness.  1.  Acute on chronic respiratory failure on home oxygen 2 L. weaned down to 2 L. Multifactorial including chronic pulmonary embolism: Continue oral Eliquis Chronic underlying interstitial lung disease and COPD He was treated with steroid, nebulizer per pulmonary consult.  2. Acute cystitis-urine culture showing 70,000 MRSA bacteria unclear clinical significance Oral doxycycline  3. Paroxysmal atrial fibrillation-continue  digoxin Rate well controlled. Patient is status post AICD and pacemaker -Follows with outpatient cardiologist. On aspirin and Eliquis.  4.   Left-sided chest pain with elevated troponin due to demand ischemia Dr. Clayborn Bigness recommended no invasive strategy at this point for cardiac assessment.  Treatment as above.  5. BPH-with urinary retention, status post self catheterizations.  6. Chronic hypotension-continue hydrocortisone and midodrine  Chronic diastolic CHF.  Stable. Dehydration.  given gentle rehydration with IV fluid and follow-up BMP as outpatient.. PT evaluation suggest the patient needs to skilled nursing facility placement.  But he wants to go home with home health. DISCHARGE CONDITIONS:  Stable, discharge to home with home health and PT today. CONSULTS OBTAINED:  Treatment Team:  Yolonda Kida, MD Wilhelmina Mcardle, MD DRUG ALLERGIES:   Allergies  Allergen Reactions  . Penicillin G Hives    Has patient had a PCN reaction causing immediate rash, facial/tongue/throat swelling, SOB or lightheadedness with hypotension: Yes Has patient had a PCN reaction causing severe rash involving mucus membranes or skin necrosis: No Has patient had a PCN reaction that required hospitalization No Has patient had a PCN reaction occurring within the last 10 years: No If all of the above answers are "NO", then may proceed with Cephalosporin use.  . Sulfa Antibiotics Rash   DISCHARGE MEDICATIONS:   Allergies as of 05/29/2017      Reactions   Penicillin G Hives   Has patient had a PCN reaction causing immediate rash, facial/tongue/throat swelling, SOB or lightheadedness with hypotension: Yes Has patient had a PCN reaction causing severe rash involving mucus membranes or skin necrosis: No Has patient had a PCN reaction that required hospitalization No Has patient had a PCN reaction occurring within the last 10  years: No If all of the above answers are "NO", then may proceed  with Cephalosporin use.   Sulfa Antibiotics Rash      Medication List    TAKE these medications   acidophilus Caps capsule Take 1 capsule by mouth every evening.   apixaban 5 MG Tabs tablet Commonly known as:  ELIQUIS 10 mg po bid for 3 days, then 5 mg po bid. Notes to patient:  10 MG TWICE A DAY FOR 3 DAYS, THEN 5 MG TWICE A DAY   aspirin EC 81 MG tablet Take 81 mg by mouth daily.   benzonatate 200 MG capsule Commonly known as:  TESSALON Take 1 capsule (200 mg total) by mouth 3 (three) times daily as needed for cough.   budesonide 0.25 MG/2ML nebulizer solution Commonly known as:  PULMICORT Take 2 mLs (0.25 mg total) by nebulization 2 (two) times daily.   Cholecalciferol 5000 units Tabs Take 5,000 Units by mouth daily.   Co Q-10 120 MG Caps Take 120 mg by mouth every evening.   digoxin 0.125 MG tablet Commonly known as:  LANOXIN Take 0.125 mg by mouth daily.   fluticasone 50 MCG/ACT nasal spray Commonly known as:  FLONASE Place 2 sprays into both nostrils daily.   furosemide 20 MG tablet Commonly known as:  LASIX Take 20 mg by mouth every other day as needed for fluid.   gabapentin 600 MG tablet Commonly known as:  NEURONTIN Take 600 mg by mouth 2 (two) times daily.   guaiFENesin-codeine 100-10 MG/5ML syrup Commonly known as:  ROBITUSSIN AC Take 5 mLs by mouth 3 (three) times daily as needed for cough.   hydrocortisone 10 MG tablet Commonly known as:  CORTEF Take 10 mg by mouth 2 (two) times daily.   ipratropium-albuterol 0.5-2.5 (3) MG/3ML Soln Commonly known as:  DUONEB Take 3 mLs by nebulization every 4 (four) hours as needed.   levothyroxine 100 MCG tablet Commonly known as:  SYNTHROID, LEVOTHROID Take 100 mcg by mouth daily before breakfast.   magnesium oxide 400 (241.3 Mg) MG tablet Commonly known as:  MAG-OX Take 400 mg by mouth every evening.   methenamine 1 g tablet Commonly known as:  MANDELAMINE Take 1,000 mg by mouth 2 (two) times  daily with a meal.   midodrine 10 MG tablet Commonly known as:  PROAMATINE Take 1 tablet (10 mg total) by mouth 3 (three) times daily with meals. What changed:  when to take this   multivitamin with minerals Tabs tablet Take 1 tablet by mouth daily.   ondansetron 4 MG disintegrating tablet Commonly known as:  ZOFRAN ODT Take 1 tablet (4 mg total) by mouth every 8 (eight) hours as needed for nausea or vomiting.   pantoprazole 40 MG tablet Commonly known as:  PROTONIX Take 40 mg by mouth daily before breakfast.   potassium chloride 10 MEQ tablet Commonly known as:  K-DUR Take 10 mEq by mouth daily as needed (for fluid retention when taking furosemide). Notes to patient:  TAKE WITH FOOD   pyridostigmine 60 MG tablet Commonly known as:  MESTINON Take 60 mg by mouth 2 (two) times daily.   ranitidine 300 MG tablet Commonly known as:  ZANTAC Take 300 mg by mouth every evening.   saw palmetto 500 MG capsule Take 1,000 mg by mouth every evening.   simvastatin 20 MG tablet Commonly known as:  ZOCOR Take 20 mg by mouth every evening.   vitamin B-12 500 MCG tablet Commonly known as:  CYANOCOBALAMIN Take 500 mcg by mouth daily.   vitamin C 1000 MG tablet Take 500 mg by mouth 2 (two) times daily.   vitamin E 400 UNIT capsule Take 400 Units by mouth every evening.            Durable Medical Equipment  (From admission, onward)        Start     Ordered   05/29/17 0939  For home use only DME Nebulizer machine  Once    Question:  Patient needs a nebulizer to treat with the following condition  Answer:  COPD (chronic obstructive pulmonary disease) ([place])   05/29/17 0940       DISCHARGE INSTRUCTIONS:  See AVS. If you experience worsening of your admission symptoms, develop shortness of breath, life threatening emergency, suicidal or homicidal thoughts you must seek medical attention immediately by calling 911 or calling your MD immediately  if symptoms less  severe.  You Must read complete instructions/literature along with all the possible adverse reactions/side effects for all the Medicines you take and that have been prescribed to you. Take any new Medicines after you have completely understood and accpet all the possible adverse reactions/side effects.   Please note  You were cared for by a hospitalist during your hospital stay. If you have any questions about your discharge medications or the care you received while you were in the hospital after you are discharged, you can call the unit and asked to speak with the hospitalist on call if the hospitalist that took care of you is not available. Once you are discharged, your primary care physician will handle any further medical issues. Please note that NO REFILLS for any discharge medications will be authorized once you are discharged, as it is imperative that you return to your primary care physician (or establish a relationship with a primary care physician if you do not have one) for your aftercare needs so that they can reassess your need for medications and monitor your lab values.    On the day of Discharge:  VITAL SIGNS:  Blood pressure (!) 98/52, pulse (!) 105, temperature (!) 97.5 F (36.4 C), temperature source Oral, resp. rate 14, height 5\' 10"  (1.778 m), weight 179 lb 14.3 oz (81.6 kg), SpO2 99 %. PHYSICAL EXAMINATION:  GENERAL:  83 y.o.-year-old patient lying in the bed with no acute distress.  EYES: Pupils equal, round, reactive to light and accommodation. No scleral icterus. Extraocular muscles intact.  HEENT: Head atraumatic, normocephalic. Oropharynx and nasopharynx clear.  NECK:  Supple, no jugular venous distention. No thyroid enlargement, no tenderness.  LUNGS: Normal breath sounds bilaterally, no wheezing, rales,rhonchi or crepitation. No use of accessory muscles of respiration.  CARDIOVASCULAR: S1, S2 normal. No murmurs, rubs, or gallops.  ABDOMEN: Soft, non-tender,  non-distended. Bowel sounds present. No organomegaly or mass.  EXTREMITIES: No pedal edema, cyanosis, or clubbing.  NEUROLOGIC: Cranial nerves II through XII are intact. Muscle strength 4/5 in all extremities. Sensation intact. Gait not checked.  PSYCHIATRIC: The patient is alert and oriented x 3.  SKIN: No obvious rash, lesion, or ulcer.  DATA REVIEW:   CBC Recent Labs  Lab 05/28/17 0456  WBC 15.1*  HGB 12.9*  HCT 39.0*  PLT 176    Chemistries  Recent Labs  Lab 05/29/17 0358  NA 135  K 4.9  CL 99*  CO2 29  GLUCOSE 152*  BUN 39*  CREATININE 0.69  CALCIUM 9.1     Microbiology Results  Results for orders  placed or performed during the hospital encounter of 05/22/17  Blood Culture (routine x 2)     Status: None   Collection Time: 05/22/17 10:03 AM  Result Value Ref Range Status   Specimen Description BLOOD RIGHT FA  Final   Special Requests   Final    BOTTLES DRAWN AEROBIC AND ANAEROBIC Blood Culture adequate volume   Culture   Final    NO GROWTH 5 DAYS Performed at [facility], [address] [place], [place] [address]    Report Status 05/27/2017 FINAL  Final  Blood Culture (routine x 2)     Status: None   Collection Time: 05/22/17 10:04 AM  Result Value Ref Range Status   Specimen Description BLOOD RIGHT [facility]  Final   Special Requests   Final    BOTTLES DRAWN AEROBIC AND ANAEROBIC Blood Culture adequate volume   Culture   Final    NO GROWTH 5 DAYS Performed at [facility], [address], [place], [place] [address]    Report Status 05/27/2017 FINAL  Final  Urine Culture     Status: Abnormal   Collection Time: 05/22/17  3:11 PM  Result Value Ref Range Status   Specimen Description   Final    URINE, RANDOM Performed at [facility], [address] [place], [place] [address]    Special Requests   Final    NONE Performed at [facility], [address], [place], [place] [address]    Culture (A)  Final    70,000  COLONIES/mL METHICILLIN RESISTANT STAPHYLOCOCCUS AUREUS   Report Status 05/24/2017 FINAL  Final   Organism ID, Bacteria METHICILLIN RESISTANT STAPHYLOCOCCUS AUREUS (A)  Final      Susceptibility   Methicillin resistant staphylococcus aureus - MIC*    CIPROFLOXACIN >=8 RESISTANT Resistant     GENTAMICIN <=0.5 SENSITIVE Sensitive     NITROFURANTOIN <=16 SENSITIVE Sensitive     OXACILLIN >=4 RESISTANT Resistant     TETRACYCLINE <=1 SENSITIVE Sensitive     VANCOMYCIN <=0.5 SENSITIVE Sensitive     TRIMETH/SULFA <=10 SENSITIVE Sensitive     CLINDAMYCIN <=0.25 SENSITIVE Sensitive     RIFAMPIN <=0.5 SENSITIVE Sensitive     Inducible Clindamycin NEGATIVE Sensitive     * 70,000 COLONIES/mL METHICILLIN RESISTANT STAPHYLOCOCCUS AUREUS    RADIOLOGY:  No results found.   Management plans discussed with the patient, family and they are in agreement.  CODE STATUS: DNR   TOTAL TIME TAKING CARE OF THIS PATIENT: 33 minutes.    Demetrios Loll M.D on 05/29/2017 at 2:34 PM  Between 7am to 6pm - Pager - [phone]  After 6pm go to [email or web address] - Proofreader  [facility]  Office  [phone]  CC: Primary care physician; [name], MD   Note: This dictation was prepared with Dragon dictation along with smaller phrase technology. Any transcriptional errors that result from this process are unintentional.

## 2017-05-29 NOTE — Care Management (Signed)
Patient currently followed by [facility] and receives orders to resume RN PT and add aide.  Outpatient palliative is also following

## 2017-05-29 NOTE — Progress Notes (Signed)
Advanced Care Plan.  Purpose of Encounter: Palliative care and hospice care Parties in Attendance: The patient, RN and me. Patient's Decisional Capacity: Yes. Medical Story: Russell Howard  is a 83 y.o. male with a known history of cardiomyopathy with EF of 45%, COPD on 2 L home oxygen, remote history of smoking, paroxysmal A. fib, hyperlipidemia, valvular heart disease, benign prostatic hypertrophy requiring self-catheterization twice a day presents to hospital secondary to worsening weakness and sleepiness.  He has acute on chronic respiratory failure due to multiple factorial including chronic pulmonary emboli and underlying interstitial disease.  She also has a cholecystitis, paroxysmal A. fib and elevated troponin due to demanding ischemia.  He has chronic hypotension and CHF.  I discussed with the patient about his current condition, poor prognosis, palliative care and hospice care.  He said he is not interested in and does not wants palliative care or hospice care.  Goals of Care Determinations: Palliative care or hospice care.:  Plan:  Code Status: DNR Time spent discussing advance care planning: 18 minutes.

## 2017-06-01 ENCOUNTER — Telehealth: Payer: Self-pay

## 2017-06-01 NOTE — Telephone Encounter (Signed)
Flagged on EMMI report for not reading discharge papers.  Called and spoke with patient and [name], his daughter-in-law.  They report they thought they had thrown them away accidentally before reading over them, but found them this morning before the Home Health RN came to visit.  Patient has not read over them, but [name] has.  No other questions or concerns.  I thanked them for their time and mentioned he would receive one more automated call in the next few days as a final check in.

## 2017-06-04 ENCOUNTER — Telehealth: Payer: Self-pay | Admitting: Urology

## 2017-06-04 ENCOUNTER — Telehealth: Payer: Self-pay

## 2017-06-04 ENCOUNTER — Other Ambulatory Visit: Payer: Self-pay

## 2017-06-04 LAB — ACETYLCHOLINE RECEPTOR AB, ALL
ACETYLCHOL BLOCK AB: 18 % (ref 0–25)
Acety choline binding ab: 0.15 nmol/L (ref 0.00–0.24)
Acetylcholine Modulat Ab: <12 % (ref 0–20)

## 2017-06-04 NOTE — Telephone Encounter (Signed)
Thank you,  I called and spoke with his daughter [name] and she is in agreement with this. I have also faxed the PCP back and made them aware. Thank you,  Sharyn Lull

## 2017-06-04 NOTE — Telephone Encounter (Signed)
Can you take a look at his hospital notes and let me know if he needs to be seen. I got  Referral from Dr. Doy Hutching and he is referring him to [place] for hematuria, it says a low amount. The patient's caregiver states that he had a blood clot in his lung and they put him on Eliquis and then that's when they noticed the blood in his urine. He has a follow up next year with a cysto. Does he need to be seen sooner?   Sharyn Lull

## 2017-06-04 NOTE — Telephone Encounter (Signed)
No, I do not really think he needs to be seen necessarily.  He had a cystoscopy just in January which was fine.  He is also had a CT scan in 05/2017 which shows no genitourinary pathology.  As such, I do not think that there is any things that we would do or offer him.  He has chronic cystitis with blood thinner which is likely the cause.    If the blood worsens or he starts passing clots, and have him come in, otherwise follow-up as scheduled.  Hollice Espy, MD

## 2017-06-04 NOTE — Telephone Encounter (Signed)
EMMI Follow-up: It was noted on the report that the patient had questions so I called to check. His D-I-L, caregiver was there and put him on speaker phone and I asked if they had any questions post discharge.  She said when the call came in yesterday, he coughed and system said it would take care of that.  I explained the system was sensitive and that it flagged [place] to call him.  I let them know there would be another automated call with different questions and to let us know if there were any needs at that time. D-I-L stated patient was still weak and not able to get up but [facility] RN & PT arranged and coming out to see patient today.  No other needs noted at this time.

## 2017-06-08 ENCOUNTER — Encounter: Payer: Self-pay | Admitting: Pulmonary Disease

## 2017-06-08 ENCOUNTER — Ambulatory Visit (INDEPENDENT_AMBULATORY_CARE_PROVIDER_SITE_OTHER): Payer: Medicare Other | Admitting: Pulmonary Disease

## 2017-06-08 VITALS — BP 122/70 | HR 96 | Ht 70.0 in

## 2017-06-08 DIAGNOSIS — J9611 Chronic respiratory failure with hypoxia: Secondary | ICD-10-CM | POA: Diagnosis not present

## 2017-06-08 DIAGNOSIS — I2782 Chronic pulmonary embolism: Secondary | ICD-10-CM | POA: Diagnosis not present

## 2017-06-08 DIAGNOSIS — IMO0002 Reserved for concepts with insufficient information to code with codable children: Secondary | ICD-10-CM

## 2017-06-08 DIAGNOSIS — J701 Chronic and other pulmonary manifestations due to radiation: Secondary | ICD-10-CM

## 2017-06-08 DIAGNOSIS — Z85118 Personal history of other malignant neoplasm of bronchus and lung: Secondary | ICD-10-CM | POA: Diagnosis not present

## 2017-06-08 NOTE — Patient Instructions (Addendum)
Continue oxygen therapy is close to 24 hours/day as possible Continue DuoNeb as needed for increased shortness of breath, wheezing, chest tightness, cough Continue Eliquis which should be a lifelong medication Follow-up in 2 to 3 months

## 2017-06-09 NOTE — Progress Notes (Signed)
PULMONARY OFFICE FOLLOW NOTE   PROBLEMS:  History of lung cancer, S/P XRT to R lung 2015 Extensive R lung radiation fibrosis Chronic hypoxemic respiratory failure Hospitalized 01/28-01/31/18 for severe sepsis and presumed PNA  DATA/EVENTS: PET 03/02/15: No findings suspicious for local recurrence or definite metastatic disease CT chest 11/10/15: Persistent volume loss, prominent fibrosis, bronchiectasis and interstitial infiltrates in the right upper lobe, with similar but less severe changes in the remaining of the right lung parenchyma. Increasing perifissural and peripleural nodular and linear interstitial thickening in the left lung. CT chest 05/23/17: Large chronic R pulmonary embolism occlusive in RUL. At least 50% stenosis R main PA. Ground-glass centrilobular nodules LLL may be infectious or inflammatory. Mildly worsening hilar lymphadenopathy. Similar extensive fibrosis with bronchiectasis seen with chronic interstitial lung disease. Chronic R moderate pleural effusion (reviewed by me)  INTERVAL HISTORY: Hospitalized 04/19-04/26/19 with acute on chronic hypoxemic resp failure. CT chest as above. Apixaban resumed and he was treated as PNA.  SUBJ: This is a post-hospitalization visit. He now feels that he is back to his baseline finally (over past couple of days). He remains on hydrocortisone 10 mg BID. Denies CP, fever, purulent sputum, hemoptysis, LE edema and calf tenderness. He is receiving HHN assistance since DC to home.    OBJ: Vitals:   06/08/17 1422 06/08/17 1424  BP:  122/70  Pulse:  96  SpO2:  94%  Height: 5\' 10"  (1.778 m)   2 LPM [place] pulse   Gen: No overt distress. In WC HEENT: NCAT, WNL Neck: No LAN, JVD Lungs: BS nearly absent on R. L basilar crackles. No wheezes Cardiovascular: RRR, no M Abdomen: soft, NABS Ext: no edema Neuro: No focal deficits   DATA: BMP Latest Ref Rng & Units 05/29/2017 05/28/2017 05/25/2017  Glucose 65 - 99 mg/dL 152(H) 101(H) 181(H)   BUN 6 - 20 mg/dL 39(H) 43(H) 28(H)  Creatinine 0.61 - 1.24 mg/dL 0.69 0.82 1.04  Sodium 135 - 145 mmol/L 135 135 133(L)  Potassium 3.5 - 5.1 mmol/L 4.9 5.0 5.2(H)  Chloride 101 - 111 mmol/L 99(L) 102 101  CO2 22 - 32 mmol/L 29 28 25   Calcium 8.9 - 10.3 mg/dL 9.1 8.9 8.7(L)    CBC Latest Ref Rng & Units 05/28/2017 05/25/2017 05/24/2017  WBC 3.8 - 10.6 K/uL 15.1(H) 16.1(H) 6.4  Hemoglobin 13.0 - 18.0 g/dL 12.9(L) 13.1 13.3  Hematocrit 40.0 - 52.0 % 39.0(L) 38.4(L) 39.9(L)  Platelets 150 - 440 K/uL 176 160 162    CXR 04/19:  NSC extensive opacification of R hemithorax. Patchy scattered infiltrates on L  IMPRESSION: History of lung cancer  Extensive radiation fibrosis   Chronic hypoxemic respiratory failure  Chronic pulmonary embolism   Recent hospitalization for acute on chronic resp failure with hypoxemia. Possible PNA. Finding of chronic PE. Persistent and extensive radiation fibrosis  DISCUSSION: We discussed goals of care. He (and is daughter in law) recognize the advanced nature of his lung disease and the limited options of treatment. They have already discussed Palliative Care/Hospice which I encouraged would be good options for him at this point  PLAN: Continue oxygen therapy is close to 24 hours/day as possible Continue DuoNeb as needed for increased shortness of breath, wheezing, chest tightness, cough Continue Eliquis which should be a lifelong medication Follow-up in 2 to 3 months  Merton Border, MD PCCM service Mobile [phone] Pager [phone] 06/09/2017 4:06 PM

## 2017-06-19 ENCOUNTER — Encounter: Payer: Self-pay | Admitting: Emergency Medicine

## 2017-06-19 ENCOUNTER — Other Ambulatory Visit: Payer: Self-pay

## 2017-06-19 ENCOUNTER — Emergency Department
Admission: EM | Admit: 2017-06-19 | Discharge: 2017-06-19 | Disposition: A | Discharge status: Home / Self Care | Payer: Medicare Other | Source: Home / Self Care | Attending: Emergency Medicine | Admitting: Emergency Medicine

## 2017-06-19 ENCOUNTER — Emergency Department: Payer: Medicare Other

## 2017-06-19 DIAGNOSIS — Z87891 Personal history of nicotine dependence: Secondary | ICD-10-CM

## 2017-06-19 DIAGNOSIS — E039 Hypothyroidism, unspecified: Secondary | ICD-10-CM | POA: Insufficient documentation

## 2017-06-19 DIAGNOSIS — R197 Diarrhea, unspecified: Secondary | ICD-10-CM | POA: Insufficient documentation

## 2017-06-19 DIAGNOSIS — J690 Pneumonitis due to inhalation of food and vomit: Secondary | ICD-10-CM | POA: Diagnosis not present

## 2017-06-19 DIAGNOSIS — E119 Type 2 diabetes mellitus without complications: Secondary | ICD-10-CM

## 2017-06-19 DIAGNOSIS — Z7901 Long term (current) use of anticoagulants: Secondary | ICD-10-CM

## 2017-06-19 DIAGNOSIS — Z7982 Long term (current) use of aspirin: Secondary | ICD-10-CM | POA: Insufficient documentation

## 2017-06-19 DIAGNOSIS — I110 Hypertensive heart disease with heart failure: Secondary | ICD-10-CM | POA: Insufficient documentation

## 2017-06-19 DIAGNOSIS — C3431 Malignant neoplasm of lower lobe, right bronchus or lung: Secondary | ICD-10-CM | POA: Insufficient documentation

## 2017-06-19 DIAGNOSIS — Z95810 Presence of automatic (implantable) cardiac defibrillator: Secondary | ICD-10-CM

## 2017-06-19 DIAGNOSIS — Z79899 Other long term (current) drug therapy: Secondary | ICD-10-CM

## 2017-06-19 DIAGNOSIS — R55 Syncope and collapse: Secondary | ICD-10-CM

## 2017-06-19 DIAGNOSIS — J449 Chronic obstructive pulmonary disease, unspecified: Secondary | ICD-10-CM

## 2017-06-19 DIAGNOSIS — I5033 Acute on chronic diastolic (congestive) heart failure: Secondary | ICD-10-CM

## 2017-06-19 DIAGNOSIS — R0902 Hypoxemia: Secondary | ICD-10-CM | POA: Diagnosis not present

## 2017-06-19 LAB — CBC WITH DIFFERENTIAL/PLATELET
Basophils Absolute: 0 10*3/uL (ref 0–0.1)
Basophils Relative: 0 %
Eosinophils Absolute: 0.4 10*3/uL (ref 0–0.7)
Eosinophils Relative: 4 %
HEMATOCRIT: 42.7 % (ref 40.0–52.0)
Hemoglobin: 14.2 g/dL (ref 13.0–18.0)
LYMPHS PCT: 14 %
Lymphs Abs: 1.3 10*3/uL (ref 1.0–3.6)
MCH: 30.3 pg (ref 26.0–34.0)
MCHC: 33.3 g/dL (ref 32.0–36.0)
MCV: 90.9 fL (ref 80.0–100.0)
MONO ABS: 0.9 10*3/uL (ref 0.2–1.0)
Monocytes Relative: 11 %
NEUTROS ABS: 6.4 10*3/uL (ref 1.4–6.5)
Neutrophils Relative %: 71 %
Platelets: 263 10*3/uL (ref 150–440)
RBC: 4.7 MIL/uL (ref 4.40–5.90)
RDW: 16.3 % — AB (ref 11.5–14.5)
WBC: 9 10*3/uL (ref 3.8–10.6)

## 2017-06-19 LAB — COMPREHENSIVE METABOLIC PANEL
ALT: 20 U/L (ref 17–63)
ANION GAP: 10 (ref 5–15)
AST: 27 U/L (ref 15–41)
Albumin: 3.8 g/dL (ref 3.5–5.0)
Alkaline Phosphatase: 90 U/L (ref 38–126)
BILIRUBIN TOTAL: 0.8 mg/dL (ref 0.3–1.2)
BUN: 15 mg/dL (ref 6–20)
CO2: 27 mmol/L (ref 22–32)
Calcium: 9.2 mg/dL (ref 8.9–10.3)
Chloride: 103 mmol/L (ref 101–111)
Creatinine, Ser: 1.13 mg/dL (ref 0.61–1.24)
GFR, EST NON AFRICAN AMERICAN: 59 mL/min — AB (ref >60)
Glucose, Bld: 147 mg/dL — ABNORMAL HIGH (ref 65–99)
POTASSIUM: 4 mmol/L (ref 3.5–5.1)
Sodium: 140 mmol/L (ref 135–145)
TOTAL PROTEIN: 8.3 g/dL — AB (ref 6.5–8.1)

## 2017-06-19 LAB — BRAIN NATRIURETIC PEPTIDE: B Natriuretic Peptide: 187 pg/mL — ABNORMAL HIGH (ref 0.0–100.0)

## 2017-06-19 LAB — TROPONIN I: Troponin I: <0.03 ng/mL (ref <0.03)

## 2017-06-19 MED ORDER — IPRATROPIUM-ALBUTEROL 0.5-2.5 (3) MG/3ML IN SOLN
3.0000 mL | Freq: Once | RESPIRATORY_TRACT | Status: DC
  Filled 2017-06-19: qty 3

## 2017-06-19 MED ORDER — CIPROFLOXACIN HCL 500 MG PO TABS: 500.0000 mg | ORAL_TABLET | Freq: Two times a day (BID) | ORAL | 0 refills | Status: DC

## 2017-06-19 MED ORDER — ONDANSETRON HCL 4 MG/2ML IJ SOLN
4.0000 mg | Freq: Once | INTRAMUSCULAR | Status: AC
  Administered 2017-06-19: 4 mg via INTRAVENOUS
  Filled 2017-06-19: qty 2

## 2017-06-19 MED ORDER — SODIUM CHLORIDE 0.9 % IV BOLUS
500.0000 mL | Freq: Once | INTRAVENOUS | Status: AC
  Administered 2017-06-19: 500 mL via INTRAVENOUS

## 2017-06-19 NOTE — ED Notes (Signed)
Patient to car in wheelchair with EDT. Verbalized understanding of discharge instructions and follow-up care.

## 2017-06-19 NOTE — Discharge Instructions (Addendum)
It was a pleasure to take care of you today, and thank you for coming to our emergency department.  If you have any questions or concerns before leaving please ask the nurse to grab me and I'm more than happy to go through your aftercare instructions again.  If you were prescribed any opioid pain medication today such as Norco, Vicodin, Percocet, morphine, hydrocodone, or oxycodone please make sure you do not drive when you are taking this medication as it can alter your ability to drive safely.  If you have any concerns once you are home that you are not improving or are in fact getting worse before you can make it to your follow-up appointment, please do not hesitate to call 911 and come back for further evaluation.  Darel Hong, MD  Results for orders placed or performed during the hospital encounter of 06/19/17  Brain natriuretic peptide  Result Value Ref Range   B Natriuretic Peptide 187.0 (H) 0.0 - 100.0 pg/mL  Troponin I  Result Value Ref Range   Troponin I <0.03 <0.03 ng/mL  CBC with Differential  Result Value Ref Range   WBC 9.0 3.8 - 10.6 K/uL   RBC 4.70 4.40 - 5.90 MIL/uL   Hemoglobin 14.2 13.0 - 18.0 g/dL   HCT 42.7 40.0 - 52.0 %   MCV 90.9 80.0 - 100.0 fL   MCH 30.3 26.0 - 34.0 pg   MCHC 33.3 32.0 - 36.0 g/dL   RDW 16.3 (H) 11.5 - 14.5 %   Platelets 263 150 - 440 K/uL   Neutrophils Relative % 71 %   Neutro Abs 6.4 1.4 - 6.5 K/uL   Lymphocytes Relative 14 %   Lymphs Abs 1.3 1.0 - 3.6 K/uL   Monocytes Relative 11 %   Monocytes Absolute 0.9 0.2 - 1.0 K/uL   Eosinophils Relative 4 %   Eosinophils Absolute 0.4 0 - 0.7 K/uL   Basophils Relative 0 %   Basophils Absolute 0.0 0 - 0.1 K/uL  Comprehensive metabolic panel  Result Value Ref Range   Sodium 140 135 - 145 mmol/L   Potassium 4.0 3.5 - 5.1 mmol/L   Chloride 103 101 - 111 mmol/L   CO2 27 22 - 32 mmol/L   Glucose, Bld 147 (H) 65 - 99 mg/dL   BUN 15 6 - 20 mg/dL   Creatinine, Ser 1.13 0.61 - 1.24 mg/dL   Calcium 9.2 8.9 - 10.3 mg/dL   Total Protein 8.3 (H) 6.5 - 8.1 g/dL   Albumin 3.8 3.5 - 5.0 g/dL   AST 27 15 - 41 U/L   ALT 20 17 - 63 U/L   Alkaline Phosphatase 90 38 - 126 U/L   Total Bilirubin 0.8 0.3 - 1.2 mg/dL   GFR calc non Af Amer 59 (L) >60 mL/min   GFR calc Af Amer >60 >60 mL/min   Anion gap 10 5 - 15   Ct Head Wo Contrast  Result Date: 05/22/2017 CLINICAL DATA:  Increasing weakness. Altered level of consciousness. EXAM: CT HEAD WITHOUT CONTRAST TECHNIQUE: Contiguous axial images were obtained from the base of the skull through the vertex without intravenous contrast. COMPARISON:  02/25/2017 FINDINGS: Brain: There is atrophy and chronic small vessel disease changes. No acute intracranial abnormality. Specifically, no hemorrhage, hydrocephalus, mass lesion, acute infarction, or significant intracranial injury. Vascular: No hyperdense vessel or unexpected calcification. Skull: No acute calvarial abnormality. Sinuses/Orbits: Mucosal thickening throughout the paranasal sinuses. Mastoid air cells are clear. Orbital soft tissues unremarkable. Other: None  IMPRESSION: No acute intracranial abnormality. Atrophy, chronic microvascular disease. Electronically Signed   By: Rolm Baptise M.D.   On: 05/22/2017 11:07   Ct Chest W Contrast  Result Date: 05/23/2017 CLINICAL DATA:  Cough and congestion worsening for few days. Shortness of breath. History of COPD, cardiomyopathy, lung cancer. EXAM: CT CHEST WITH CONTRAST TECHNIQUE: Multidetector CT imaging of the chest was performed during intravenous contrast administration. CONTRAST:  43mL ISOVUE-300 IOPAMIDOL (ISOVUE-300) INJECTION 61% COMPARISON:  Chest radiograph April 19th 2019 and CT chest November 10, 2015 and CT chest September 07, 2015 FINDINGS: CARDIOVASCULAR: The heart is mildly enlarged and unchanged. No pericardial effusion. Severe coronary artery calcifications. Thoracic aorta is normal course and caliber with moderate calcific atherosclerosis. large  mural based filling defect RIGHT main pulmonary artery narrowing the lumen by at least 50%. Propagation of soft tissue into RIGHT upper lobe pulmonary artery with partial occlusion. MEDIASTINUM/NODES: Similar mediastinal, worsening hilar lymphadenopathy. LEFT AICD. LUNGS/PLEURA: Similar RIGHT lung bronchiectasis, with bilateral subpleural fibrosis. Faint ground-glass nodules LEFT lower lobe new from prior CT. LEFT calcified granuloma. Stable moderate RIGHT pleural effusion. UPPER ABDOMEN: Nonacute. Small hiatal hernia. Mildly hypodense liver likely reflecting hepatic steatosis. MUSCULOSKELETAL: Nonacute. Osteopenia and mild degenerative change of the thoracic spine. IMPRESSION: 1. Large chronic RIGHT pulmonary embolism occlusive in RIGHT upper lobe. At least 50% stenosis RIGHT main pulmonary artery. 2. Ground-glass centrilobular nodules LEFT lower lobe may be infectious or inflammatory. Mildly worsening hilar lymphadenopathy. 3. Similar extensive fibrosis with bronchiectasis seen with chronic interstitial lung disease. 4. Chronic RIGHT moderate pleural effusion. 5. Acute findings discussed with and reconfirmed by Dr.SHREYANG PATEL on 05/23/2017 at 2:44 pm. Electronically Signed   By: Elon Alas M.D.   On: 05/23/2017 14:47   US Venous Img Upper Uni Left  Result Date: 05/26/2017 CLINICAL DATA:  83 year old male with a history left arm swelling EXAM: LEFT UPPER EXTREMITY VENOUS DOPPLER ULTRASOUND TECHNIQUE: Gray-scale sonography with graded compression, as well as color Doppler and duplex ultrasound were performed to evaluate the upper extremity deep venous system from the level of the subclavian vein and including the jugular, axillary, basilic, radial, ulnar and upper cephalic vein. Spectral Doppler was utilized to evaluate flow at rest and with distal augmentation maneuvers. COMPARISON:  None. FINDINGS: Contralateral Subclavian Vein: Respiratory phasicity is normal and symmetric with the symptomatic  side. No evidence of thrombus. Normal compressibility. Internal Jugular Vein: No evidence of thrombus. Normal compressibility, respiratory phasicity and response to augmentation. Subclavian Vein: No evidence of thrombus. Normal compressibility, respiratory phasicity and response to augmentation. Axillary Vein: No evidence of thrombus. Normal compressibility, respiratory phasicity and response to augmentation. Cephalic Vein: No evidence of thrombus. Normal compressibility, respiratory phasicity and response to augmentation. Basilic Vein: No evidence of thrombus. Normal compressibility, respiratory phasicity and response to augmentation. Brachial Veins: No evidence of thrombus. Normal compressibility, respiratory phasicity and response to augmentation. Radial Veins: No evidence of thrombus. Normal compressibility, respiratory phasicity and response to augmentation. Ulnar Veins: No evidence of thrombus. Normal compressibility, respiratory phasicity and response to augmentation. Other Findings:  None visualized. IMPRESSION: Sonographic survey of the left upper extremity negative for DVT. Electronically Signed   By: Corrie Mckusick D.O.   On: 05/26/2017 12:38   Dg Chest Port 1 View  Result Date: 06/19/2017 CLINICAL DATA:  Acute onset of syncope. Unconscious for 5 minutes. Shortness of breath. EXAM: PORTABLE CHEST 1 VIEW COMPARISON:  Chest radiograph performed 05/22/2017, and CT of the chest performed 05/23/2017 FINDINGS: A chronic moderate right pleural effusion  is again noted, with underlying honeycombing. This is slightly improved from the prior study. Superimposed pneumonia cannot be excluded, given the patient's symptoms. Known ground-glass opacities within the left lung are less well characterized on radiograph. No pneumothorax is seen. Rightward mediastinal shift is again noted, reflecting right-sided volume loss. A pacemaker/AICD is noted overlying the left chest wall, with leads overlying the right atrium,  right ventricle and coronary sinus. No acute osseous abnormalities are seen. IMPRESSION: 1. Moderate right pleural effusion, with underlying honeycombing, slightly improved from the prior study. Superimposed pneumonia cannot be excluded, given the patient's symptoms. 2. Known ground-glass opacities within the left lung are less well characterized on radiograph. Electronically Signed   By: Garald Balding M.D.   On: 06/19/2017 00:50   Dg Chest Port 1 View  Result Date: 05/22/2017 CLINICAL DATA:  Shortness of breath. EXAM: PORTABLE CHEST 1 VIEW COMPARISON:  04/29/2017. FINDINGS: AICD in stable position. Stable cardiomegaly. Stable bilateral interstitial prominence consistent chronic interstitial lung disease. Stable right pleural thickening consistent scarring. Prior cardiac valve replacement. No acute bony abnormality. IMPRESSION: 1. Stable changes of chronic interstitial lung disease and right-sided pleural scarring. 2. AICD in stable position. Prior cardiac valve replacement. Stable cardiomegaly. Electronically Signed   By: Marcello Moores  Register   On: 05/22/2017 10:23

## 2017-06-19 NOTE — ED Triage Notes (Signed)
Patient from home via [facility]. Reports he went to restroom to have bowel movement when he had syncopal episode. Patient's daughter found him in restroom. States patient was unconscious for approximately 5 minutes. Patient denies recent illness. History of lung cancer with persistent cough. Alert and oriented x4 upon arrival.

## 2017-06-19 NOTE — ED Notes (Signed)
Joey from [facility] called to report patient had no significant events recorded that would contribute to his loss of consciousness. Will fax official report. MD aware.

## 2017-06-19 NOTE — ED Provider Notes (Signed)
[facility] Emergency Department Provider Note  ____________________________________________   First MD Initiated Contact with Patient 06/19/17 0022     (approximate)  I have reviewed the triage vital signs and the nursing notes.   HISTORY  Chief Complaint Loss of Consciousness   HPI Saket Morrone is a 83 y.o. male who comes to the emergency department via EMS after a syncopal event while having a bowel movement.  According to the patient's daughter she found the patient slumped over in the restroom and he was unconscious for several minutes and then woke up.  He says that he went to the bathroom and had a large loose bowel movement and subsequently syncopized.  He does have a complex past medical history including CHF, pacemaker, and lung cancer.  He is currently asymptomatic.  His symptoms came on suddenly and passed quickly on their own.  When he was defecating he did have sharp cramping diffuse lower abdominal pain nonradiating.  It was improved after defecating.  Past Medical History:  Diagnosis Date  . Acquired hypothyroidism 11/02/2014  . BPH (benign prostatic hyperplasia)   . Cardiac defibrillator in place 11/02/2014  . Cardiomyopathy ([place])   . CHF (congestive heart failure) ([place])   . Chronic diastolic heart failure ([place]) 11/02/2014  . COPD (chronic obstructive pulmonary disease) ([place])   . Gastro-esophageal reflux disease without esophagitis 11/02/2014  . H/O malignant neoplasm 11/09/2014  . Heart valve disease 11/02/2014  . History of atrial fibrillation 11/02/2014  . Lung cancer ([place])   . Neuropathy 11/02/2014  . Orthostasis 11/02/2014  . Pure hypercholesterolemia 11/02/2014  . Valvular heart disease     Patient Active Problem List   Diagnosis Date Noted  . Acute respiratory failure ([place]) 06/21/2017  . UTI (urinary tract infection) 11/17/2016  . Empyema ([place])   . Weakness   . Community acquired pneumonia   . Pleural effusion   . Palliative care  by specialist   . Acute on chronic respiratory failure with hypoxia ([place]) 08/19/2016  . Acute respiratory failure with hypoxia ([place]) 08/19/2016  . Septic shock ([place]) 03/02/2016  . Hydronephrosis, right 01/14/2016  . Constipation 01/13/2016  . Urinary retention 01/13/2016  . Lower abdominal pain 01/13/2016  . Bacteremia 01/13/2016  . Hypotension 01/13/2016  . Palliative care encounter   . Goals of care, counseling/discussion   . Cough   . Encounter for hospice care discussion   . Hypoxia 01/09/2016  . Primary cancer of bronchus of right lower lobe ([place]) 11/29/2015  . Sepsis ([place]) 06/14/2015  . Acute lower UTI 06/14/2015    Class: Acute  . H/O malignant neoplasm 11/09/2014  . Acquired hypothyroidism 11/02/2014  . Cardiomyopathy ([place]) 11/02/2014  . Acute on chronic diastolic CHF (congestive heart failure) ([place]) 11/02/2014  . Diabetes mellitus without complication ([place]) 74/01/8785  . Cardiac defibrillator in place 11/02/2014  . Gastro-esophageal reflux disease without esophagitis 11/02/2014  . History of atrial fibrillation 11/02/2014  . BP (high blood pressure) 11/02/2014  . Neuropathy 11/02/2014  . Orthostasis 11/02/2014  . Pure hypercholesterolemia 11/02/2014  . Heart valve disease 11/02/2014    Past Surgical History:  Procedure Laterality Date  . APPENDECTOMY    . CARDIAC VALVE REPLACEMENT     with a bovine valve  . DUAL ICD IMPLANT  2007/2009   implantable cardiac defibrillator  . PORT-A-CATH REMOVAL      Prior to Admission medications   Medication Sig Start Date End Date Taking? Authorizing Provider  acidophilus (RISAQUAD) CAPS capsule Take 1  capsule by mouth every evening.     [provider]  apixaban (ELIQUIS) 5 MG TABS tablet 10 mg po bid for 3 days, then 5 mg po bid. 05/29/17   Demetrios Loll, MD  Ascorbic Acid (VITAMIN C) 1000 MG tablet Take 500 mg by mouth 2 (two) times daily.     [provider]  aspirin EC 81 MG tablet Take 81 mg by mouth  daily.     [provider]  benzonatate (TESSALON) 200 MG capsule Take 1 capsule (200 mg total) by mouth 3 (three) times daily as needed for cough. Patient not taking: Reported on 06/21/2017 05/29/17   Demetrios Loll, MD  budesonide (PULMICORT) 0.25 MG/2ML nebulizer solution Take 2 mLs (0.25 mg total) by nebulization 2 (two) times daily. 05/29/17   Demetrios Loll, MD  Cholecalciferol 5000 units TABS Take 5,000 Units by mouth daily.    [provider]  ciprofloxacin (CIPRO) 500 MG tablet Take 1 tablet (500 mg total) by mouth 2 (two) times daily for 3 days. 06/19/17 06/22/17  Darel Hong, MD  Coenzyme Q10 (CO Q-10) 120 MG CAPS Take 120 mg by mouth every evening.    [provider]  dicyclomine (BENTYL) 10 MG capsule Take 1 capsule by mouth 3 (three) times daily. 06/19/17   [provider]  digoxin (LANOXIN) 0.125 MG tablet Take 0.125 mg by mouth daily.    [provider]  fluticasone (FLONASE) 50 MCG/ACT nasal spray Place 2 sprays into both nostrils daily. 05/29/17   Demetrios Loll, MD  furosemide (LASIX) 20 MG tablet Take 20 mg by mouth every other day as needed for fluid.     [provider]  gabapentin (NEURONTIN) 600 MG tablet Take 600 mg by mouth 2 (two) times daily.    [provider]  guaiFENesin-codeine (ROBITUSSIN AC) 100-10 MG/5ML syrup Take 5 mLs by mouth 3 (three) times daily as needed for cough.    [provider]  hydrocortisone (CORTEF) 10 MG tablet Take 10 mg by mouth 2 (two) times daily.     [provider]  ipratropium-albuterol (DUONEB) 0.5-2.5 (3) MG/3ML SOLN Take 3 mLs by nebulization every 4 (four) hours as needed. 05/29/17   Demetrios Loll, MD  levothyroxine (SYNTHROID, LEVOTHROID) 100 MCG tablet Take 100 mcg by mouth daily before breakfast.    [provider]  magnesium oxide (MAG-OX) 400 (241.3 Mg) MG tablet Take 400 mg by mouth every evening.    [provider]  methenamine (MANDELAMINE) 1 g tablet  Take 1,000 mg by mouth 2 (two) times daily with a meal.    [provider]  midodrine (PROAMATINE) 10 MG tablet Take 1 tablet (10 mg total) by mouth 3 (three) times daily with meals. Patient taking differently: Take 10 mg by mouth 2 (two) times daily with a meal.  08/30/16   Hillary Bow, MD  Multiple Vitamin (MULTIVITAMIN WITH MINERALS) TABS tablet Take 1 tablet by mouth daily.    [provider]  ondansetron (ZOFRAN ODT) 4 MG disintegrating tablet Take 1 tablet (4 mg total) by mouth every 8 (eight) hours as needed for nausea or vomiting. Patient not taking: Reported on 06/21/2017 04/29/17   Rudene Re, MD  pantoprazole (PROTONIX) 40 MG tablet Take 40 mg by mouth daily before breakfast.     [provider]  potassium chloride (K-DUR) 10 MEQ tablet Take 10 mEq by mouth daily as needed (for fluid retention when taking furosemide).    [provider]  pyridostigmine (  MESTINON) 60 MG tablet Take 60 mg by mouth 2 (two) times daily. 03/11/17   [provider]  ranitidine (ZANTAC) 300 MG tablet Take 300 mg by mouth every evening.    [provider]  saw palmetto 500 MG capsule Take 1,000 mg by mouth every evening.    [provider]  simvastatin (ZOCOR) 20 MG tablet Take 20 mg by mouth every evening.     [provider]  vitamin B-12 (CYANOCOBALAMIN) 500 MCG tablet Take 500 mcg by mouth daily.     [provider]  vitamin E 400 UNIT capsule Take 400 Units by mouth every evening.     [provider]    Allergies Penicillin g and Sulfa antibiotics  Family History  Problem Relation Age of Onset  . Hypertension Unknown   . Heart disease Unknown   . CAD Father     Social History Social History   Tobacco Use  . Smoking status: Former Smoker    Types: Cigarettes    Last attempt to quit: 11/21/1981    Years since quitting: 35.6  . Smokeless tobacco: Never Used  Substance Use Topics  . Alcohol use: No      Alcohol/week: 0.0 oz  . Drug use: No    Review of Systems Constitutional: No fever/chills Eyes: No visual changes. ENT: No sore throat. Cardiovascular: Denies chest pain. Respiratory: Denies shortness of breath. Gastrointestinal: Positive for abdominal pain.  No nausea, no vomiting.  Positive for diarrhea.  No constipation. Genitourinary: Negative for dysuria. Musculoskeletal: Negative for back pain. Skin: Negative for rash. Neurological: Negative for headaches, focal weakness or numbness.   ____________________________________________   PHYSICAL EXAM:  VITAL SIGNS: ED Triage Vitals  Enc Vitals Group     BP      Pulse      Resp      Temp      Temp src      SpO2      Weight      Height      Head Circumference      Peak Flow      Pain Score      Pain Loc      Pain Edu?      Excl. in [place]?     Constitutional: Alert and oriented x4 pleasant cooperative speaks in full clear sentences no diaphoresis Eyes: PERRL EOMI. Head: Atraumatic. Nose: No congestion/rhinnorhea. Mouth/Throat: No trismus Neck: No stridor.   Cardiovascular: Tachycardic rate, regular rhythm. Grossly normal heart sounds.  Good peripheral circulation Pacer pocket to left upper chest intact no signs of infection. Respiratory: Normal respiratory effort.  No retractions. Lungs CTAB and moving good air Gastrointestinal: Soft nontender Musculoskeletal: No lower extremity edema   Neurologic:  Normal speech and language. No gross focal neurologic deficits are appreciated. Skin:  Skin is warm, dry and intact. No rash noted. Psychiatric: Mood and affect are normal. Speech and behavior are normal.    ____________________________________________   DIFFERENTIAL includes but not limited to  Vasovagal syncope, cardiogenic syncope, pacemaker malfunction, dehydration, infectious diarrhea ____________________________________________   LABS (all labs ordered are listed, but only abnormal results are  displayed)  Labs Reviewed  BRAIN NATRIURETIC PEPTIDE - Abnormal; Notable for the following components:      Result Value   B Natriuretic Peptide 187.0 (*)    All other components within normal limits  CBC WITH DIFFERENTIAL/PLATELET - Abnormal; Notable for the following components:   RDW 16.3 (*)    All other  components within normal limits  COMPREHENSIVE METABOLIC PANEL - Abnormal; Notable for the following components:   Glucose, Bld 147 (*)    Total Protein 8.3 (*)    GFR calc non Af Amer 59 (*)    All other components within normal limits  GASTROINTESTINAL PANEL BY PCR, STOOL (REPLACES STOOL CULTURE)  TROPONIN I    Lab work reviewed by me largely unremarkable __________________________________________  EKG  ED ECG REPORT I, Darel Hong, the attending physician, personally viewed and interpreted this ECG.  Date: 06/19/2017 EKG Time:  Rate: 114 Rhythm: ventricularly paced Intervals: normal ST/T Wave abnormalities: normal Narrative Interpretation: no evidence of acute ischemia  ____________________________________________  RADIOLOGY  Chest x-ray reviewed by me is complicated but largely at the patient's baseline ____________________________________________   PROCEDURES  Procedure(s) performed: no  Procedures  Critical Care performed: o  Observation: no ____________________________________________   INITIAL IMPRESSION / ASSESSMENT AND PLAN / ED COURSE  Pertinent labs & imaging results that were available during my care of the patient were reviewed by me and considered in my medical decision making (see chart for details).      ----------------------------------------- 12:57 AM on 06/19/2017 -----------------------------------------  The patient's pacemaker was interrogated and no concerning events during the syncopal episode.  ____________________________________________ ----------------------------------------- 2:00 AM on  06/19/2017 ----------------------------------------- The patient's heart rate is nearly normalized and he feels improved.  Some acute kidney injury likely related to dehydration.  I had a lengthy discussion with the patient and daughter at bedside regarding antibiotics for possible infectious diarrhea and we have opted to defer but I will write a prescription in case the diarrhea recurs.  Strict return precautions have been given to the patient verbalizes understanding agreement the plan.   FINAL CLINICAL IMPRESSION(S) / ED DIAGNOSES  Final diagnoses:  Vasovagal syncope  Diarrhea, unspecified type      NEW MEDICATIONS STARTED DURING THIS VISIT:  Discharge Medication List as of 06/19/2017  2:00 AM    START taking these medications   Details  ciprofloxacin (CIPRO) 500 MG tablet Take 1 tablet (500 mg total) by mouth 2 (two) times daily for 3 days., Starting Fri 06/19/2017, Until Mon 06/22/2017, Print         Note:  This document was prepared using Dragon voice recognition software and may include unintentional dictation errors.     Darel Hong, MD 06/21/17 1340

## 2017-06-21 ENCOUNTER — Encounter: Payer: Self-pay | Admitting: Emergency Medicine

## 2017-06-21 ENCOUNTER — Emergency Department: Payer: Medicare Other

## 2017-06-21 ENCOUNTER — Inpatient Hospital Stay
Admission: EM | Admit: 2017-06-21 | Discharge: 2017-06-24 | DRG: 177 | Disposition: A | Discharge status: Home Health | Payer: Medicare Other | Attending: Internal Medicine | Admitting: Internal Medicine

## 2017-06-21 ENCOUNTER — Other Ambulatory Visit: Payer: Self-pay

## 2017-06-21 DIAGNOSIS — J449 Chronic obstructive pulmonary disease, unspecified: Secondary | ICD-10-CM | POA: Diagnosis present

## 2017-06-21 DIAGNOSIS — F4323 Adjustment disorder with mixed anxiety and depressed mood: Secondary | ICD-10-CM

## 2017-06-21 DIAGNOSIS — R0902 Hypoxemia: Secondary | ICD-10-CM | POA: Diagnosis present

## 2017-06-21 DIAGNOSIS — N401 Enlarged prostate with lower urinary tract symptoms: Secondary | ICD-10-CM | POA: Diagnosis present

## 2017-06-21 DIAGNOSIS — Z66 Do not resuscitate: Secondary | ICD-10-CM | POA: Diagnosis present

## 2017-06-21 DIAGNOSIS — F4321 Adjustment disorder with depressed mood: Secondary | ICD-10-CM | POA: Diagnosis not present

## 2017-06-21 DIAGNOSIS — Y842 Radiological procedure and radiotherapy as the cause of abnormal reaction of the patient, or of later complication, without mention of misadventure at the time of the procedure: Secondary | ICD-10-CM | POA: Diagnosis present

## 2017-06-21 DIAGNOSIS — J90 Pleural effusion, not elsewhere classified: Secondary | ICD-10-CM | POA: Diagnosis not present

## 2017-06-21 DIAGNOSIS — Z7901 Long term (current) use of anticoagulants: Secondary | ICD-10-CM

## 2017-06-21 DIAGNOSIS — J9621 Acute and chronic respiratory failure with hypoxia: Secondary | ICD-10-CM | POA: Diagnosis present

## 2017-06-21 DIAGNOSIS — Z953 Presence of xenogenic heart valve: Secondary | ICD-10-CM

## 2017-06-21 DIAGNOSIS — R197 Diarrhea, unspecified: Secondary | ICD-10-CM | POA: Diagnosis present

## 2017-06-21 DIAGNOSIS — R112 Nausea with vomiting, unspecified: Secondary | ICD-10-CM | POA: Diagnosis present

## 2017-06-21 DIAGNOSIS — Z7982 Long term (current) use of aspirin: Secondary | ICD-10-CM

## 2017-06-21 DIAGNOSIS — K219 Gastro-esophageal reflux disease without esophagitis: Secondary | ICD-10-CM | POA: Diagnosis present

## 2017-06-21 DIAGNOSIS — Z87891 Personal history of nicotine dependence: Secondary | ICD-10-CM

## 2017-06-21 DIAGNOSIS — F329 Major depressive disorder, single episode, unspecified: Secondary | ICD-10-CM | POA: Diagnosis present

## 2017-06-21 DIAGNOSIS — Z9981 Dependence on supplemental oxygen: Secondary | ICD-10-CM

## 2017-06-21 DIAGNOSIS — J690 Pneumonitis due to inhalation of food and vomit: Secondary | ICD-10-CM | POA: Diagnosis present

## 2017-06-21 DIAGNOSIS — I9589 Other hypotension: Secondary | ICD-10-CM | POA: Diagnosis present

## 2017-06-21 DIAGNOSIS — Z7189 Other specified counseling: Secondary | ICD-10-CM | POA: Diagnosis not present

## 2017-06-21 DIAGNOSIS — Z79890 Hormone replacement therapy (postmenopausal): Secondary | ICD-10-CM

## 2017-06-21 DIAGNOSIS — I2699 Other pulmonary embolism without acute cor pulmonale: Secondary | ICD-10-CM | POA: Diagnosis present

## 2017-06-21 DIAGNOSIS — J9601 Acute respiratory failure with hypoxia: Secondary | ICD-10-CM | POA: Diagnosis not present

## 2017-06-21 DIAGNOSIS — Z85118 Personal history of other malignant neoplasm of bronchus and lung: Secondary | ICD-10-CM

## 2017-06-21 DIAGNOSIS — I480 Paroxysmal atrial fibrillation: Secondary | ICD-10-CM | POA: Diagnosis present

## 2017-06-21 DIAGNOSIS — R338 Other retention of urine: Secondary | ICD-10-CM | POA: Diagnosis present

## 2017-06-21 DIAGNOSIS — Z79899 Other long term (current) drug therapy: Secondary | ICD-10-CM

## 2017-06-21 DIAGNOSIS — Z882 Allergy status to sulfonamides status: Secondary | ICD-10-CM

## 2017-06-21 DIAGNOSIS — Z95810 Presence of automatic (implantable) cardiac defibrillator: Secondary | ICD-10-CM

## 2017-06-21 DIAGNOSIS — A419 Sepsis, unspecified organism: Secondary | ICD-10-CM

## 2017-06-21 DIAGNOSIS — Z7951 Long term (current) use of inhaled steroids: Secondary | ICD-10-CM

## 2017-06-21 DIAGNOSIS — Z923 Personal history of irradiation: Secondary | ICD-10-CM

## 2017-06-21 DIAGNOSIS — I5042 Chronic combined systolic (congestive) and diastolic (congestive) heart failure: Secondary | ICD-10-CM | POA: Diagnosis present

## 2017-06-21 DIAGNOSIS — E7800 Pure hypercholesterolemia, unspecified: Secondary | ICD-10-CM | POA: Diagnosis present

## 2017-06-21 DIAGNOSIS — Z880 Allergy status to penicillin: Secondary | ICD-10-CM | POA: Diagnosis not present

## 2017-06-21 DIAGNOSIS — I248 Other forms of acute ischemic heart disease: Secondary | ICD-10-CM | POA: Diagnosis present

## 2017-06-21 DIAGNOSIS — Z515 Encounter for palliative care: Secondary | ICD-10-CM | POA: Diagnosis not present

## 2017-06-21 DIAGNOSIS — E039 Hypothyroidism, unspecified: Secondary | ICD-10-CM | POA: Diagnosis present

## 2017-06-21 DIAGNOSIS — I429 Cardiomyopathy, unspecified: Secondary | ICD-10-CM | POA: Diagnosis present

## 2017-06-21 DIAGNOSIS — I2782 Chronic pulmonary embolism: Secondary | ICD-10-CM | POA: Diagnosis present

## 2017-06-21 DIAGNOSIS — J701 Chronic and other pulmonary manifestations due to radiation: Secondary | ICD-10-CM | POA: Diagnosis present

## 2017-06-21 DIAGNOSIS — J9600 Acute respiratory failure, unspecified whether with hypoxia or hypercapnia: Secondary | ICD-10-CM | POA: Diagnosis present

## 2017-06-21 DIAGNOSIS — Z8249 Family history of ischemic heart disease and other diseases of the circulatory system: Secondary | ICD-10-CM

## 2017-06-21 LAB — PROTIME-INR
INR: 1.49
Prothrombin Time: 17.9 seconds — ABNORMAL HIGH (ref 11.4–15.2)

## 2017-06-21 LAB — CBC WITH DIFFERENTIAL/PLATELET
Basophils Absolute: 0 10*3/uL (ref 0–0.1)
Basophils Relative: 0 %
EOS PCT: 0 %
Eosinophils Absolute: 0 10*3/uL (ref 0–0.7)
HEMATOCRIT: 34.6 % — AB (ref 40.0–52.0)
Hemoglobin: 11.8 g/dL — ABNORMAL LOW (ref 13.0–18.0)
LYMPHS PCT: 7 %
Lymphs Abs: 0.6 10*3/uL — ABNORMAL LOW (ref 1.0–3.6)
MCH: 31.1 pg (ref 26.0–34.0)
MCHC: 34.2 g/dL (ref 32.0–36.0)
MCV: 90.9 fL (ref 80.0–100.0)
MONO ABS: 0.9 10*3/uL (ref 0.2–1.0)
MONOS PCT: 10 %
NEUTROS ABS: 7.4 10*3/uL — AB (ref 1.4–6.5)
Neutrophils Relative %: 83 %
PLATELETS: 222 10*3/uL (ref 150–440)
RBC: 3.8 MIL/uL — ABNORMAL LOW (ref 4.40–5.90)
RDW: 17 % — AB (ref 11.5–14.5)
WBC: 9 10*3/uL (ref 3.8–10.6)

## 2017-06-21 LAB — URINALYSIS, COMPLETE (UACMP) WITH MICROSCOPIC
BILIRUBIN URINE: NEGATIVE
Bacteria, UA: NONE SEEN
Glucose, UA: NEGATIVE mg/dL
Hgb urine dipstick: NEGATIVE
KETONES UR: NEGATIVE mg/dL
Nitrite: NEGATIVE
Protein, ur: 30 mg/dL — AB
Specific Gravity, Urine: 1.015 (ref 1.005–1.030)
pH: 5 (ref 5.0–8.0)

## 2017-06-21 LAB — COMPREHENSIVE METABOLIC PANEL
ALBUMIN: 3.1 g/dL — AB (ref 3.5–5.0)
ALK PHOS: 67 U/L (ref 38–126)
ALT: 25 U/L (ref 17–63)
ANION GAP: 9 (ref 5–15)
AST: 38 U/L (ref 15–41)
BUN: 22 mg/dL — AB (ref 6–20)
CO2: 24 mmol/L (ref 22–32)
Calcium: 8.1 mg/dL — ABNORMAL LOW (ref 8.9–10.3)
Chloride: 100 mmol/L — ABNORMAL LOW (ref 101–111)
Creatinine, Ser: 1.13 mg/dL (ref 0.61–1.24)
GFR calc Af Amer: >60 mL/min (ref >60)
GFR calc non Af Amer: 59 mL/min — ABNORMAL LOW (ref >60)
GLUCOSE: 149 mg/dL — AB (ref 65–99)
Potassium: 4.7 mmol/L (ref 3.5–5.1)
SODIUM: 133 mmol/L — AB (ref 135–145)
Total Bilirubin: 1 mg/dL (ref 0.3–1.2)
Total Protein: 7.1 g/dL (ref 6.5–8.1)

## 2017-06-21 LAB — LACTIC ACID, PLASMA
Lactic Acid, Venous: 2.3 mmol/L (ref 0.5–1.9)
Lactic Acid, Venous: 1.3 mmol/L (ref 0.5–1.9)

## 2017-06-21 LAB — TROPONIN I
TROPONIN I: 0.62 ng/mL — AB (ref <0.03)
Troponin I: 0.74 ng/mL (ref <0.03)

## 2017-06-21 LAB — MRSA PCR SCREENING: MRSA by PCR: POSITIVE — AB

## 2017-06-21 LAB — PROCALCITONIN: PROCALCITONIN: 0.17 ng/mL

## 2017-06-21 LAB — BRAIN NATRIURETIC PEPTIDE: B Natriuretic Peptide: 684 pg/mL — ABNORMAL HIGH (ref 0.0–100.0)

## 2017-06-21 MED ORDER — APIXABAN 5 MG PO TABS
5.0000 mg | ORAL_TABLET | Freq: Two times a day (BID) | ORAL | Status: DC
  Administered 2017-06-21 – 2017-06-24 (×7): 5 mg via ORAL
  Filled 2017-06-21 (×7): qty 1

## 2017-06-21 MED ORDER — SODIUM CHLORIDE 0.9% FLUSH
3.0000 mL | Freq: Two times a day (BID) | INTRAVENOUS | Status: DC
  Administered 2017-06-21 – 2017-06-24 (×7): 3 mL via INTRAVENOUS

## 2017-06-21 MED ORDER — ONDANSETRON 4 MG PO TBDP
4.0000 mg | ORAL_TABLET | Freq: Three times a day (TID) | ORAL | Status: DC | PRN
  Filled 2017-06-21: qty 1

## 2017-06-21 MED ORDER — BENZONATATE 100 MG PO CAPS: 200.0000 mg | ORAL_CAPSULE | Freq: Three times a day (TID) | ORAL | Status: DC | PRN

## 2017-06-21 MED ORDER — CO Q-10 120 MG PO CAPS
120.0000 mg | ORAL_CAPSULE | Freq: Every evening | ORAL | Status: DC
Start: 2017-06-21 — End: 2017-06-21

## 2017-06-21 MED ORDER — IPRATROPIUM-ALBUTEROL 0.5-2.5 (3) MG/3ML IN SOLN
RESPIRATORY_TRACT | Status: AC
Start: 2017-06-21 — End: 2017-06-22
  Filled 2017-06-21: qty 3

## 2017-06-21 MED ORDER — POTASSIUM CHLORIDE CRYS ER 10 MEQ PO TBCR: 10.0000 meq | EXTENDED_RELEASE_TABLET | Freq: Every day | ORAL | Status: DC | PRN

## 2017-06-21 MED ORDER — VITAMIN D3 25 MCG (1000 UNIT) PO TABS
5000.0000 [IU] | ORAL_TABLET | Freq: Every day | ORAL | Status: DC
  Administered 2017-06-23 – 2017-06-24 (×2): 5000 [IU] via ORAL
  Filled 2017-06-21 (×4): qty 5

## 2017-06-21 MED ORDER — BUDESONIDE 0.25 MG/2ML IN SUSP
0.2500 mg | Freq: Two times a day (BID) | RESPIRATORY_TRACT | Status: DC
  Administered 2017-06-21 – 2017-06-22 (×2): 0.25 mg via RESPIRATORY_TRACT
  Filled 2017-06-21 (×4): qty 2

## 2017-06-21 MED ORDER — VITAMIN C 500 MG PO TABS
500.0000 mg | ORAL_TABLET | Freq: Two times a day (BID) | ORAL | Status: DC
  Administered 2017-06-21 – 2017-06-24 (×7): 500 mg via ORAL
  Filled 2017-06-21 (×8): qty 1

## 2017-06-21 MED ORDER — GUAIFENESIN ER 600 MG PO TB12
600.0000 mg | ORAL_TABLET | Freq: Two times a day (BID) | ORAL | Status: DC
  Administered 2017-06-21 – 2017-06-24 (×7): 600 mg via ORAL
  Filled 2017-06-21 (×7): qty 1

## 2017-06-21 MED ORDER — MAGNESIUM OXIDE 400 (241.3 MG) MG PO TABS
400.0000 mg | ORAL_TABLET | Freq: Every evening | ORAL | Status: DC
  Administered 2017-06-21 – 2017-06-23 (×3): 400 mg via ORAL
  Filled 2017-06-21 (×3): qty 1

## 2017-06-21 MED ORDER — CYANOCOBALAMIN 500 MCG PO TABS
500.0000 ug | ORAL_TABLET | Freq: Every day | ORAL | Status: DC
  Administered 2017-06-21 – 2017-06-24 (×4): 500 ug via ORAL
  Filled 2017-06-21 (×4): qty 1

## 2017-06-21 MED ORDER — ASPIRIN EC 81 MG PO TBEC
81.0000 mg | DELAYED_RELEASE_TABLET | Freq: Every day | ORAL | Status: DC
  Administered 2017-06-21 – 2017-06-24 (×4): 81 mg via ORAL
  Filled 2017-06-21 (×4): qty 1

## 2017-06-21 MED ORDER — ORAL CARE MOUTH RINSE
15.0000 mL | Freq: Two times a day (BID) | OROMUCOSAL | Status: DC
  Administered 2017-06-23 – 2017-06-24 (×2): 15 mL via OROMUCOSAL

## 2017-06-21 MED ORDER — LEVOTHYROXINE SODIUM 100 MCG PO TABS
100.0000 ug | ORAL_TABLET | Freq: Every day | ORAL | Status: DC
  Administered 2017-06-22 – 2017-06-24 (×3): 100 ug via ORAL
  Filled 2017-06-21 (×3): qty 1

## 2017-06-21 MED ORDER — VITAMIN E 180 MG (400 UNIT) PO CAPS
400.0000 [IU] | ORAL_CAPSULE | Freq: Every evening | ORAL | Status: DC
  Administered 2017-06-21 – 2017-06-23 (×3): 400 [IU] via ORAL
  Filled 2017-06-21 (×4): qty 1

## 2017-06-21 MED ORDER — PYRIDOSTIGMINE BROMIDE 60 MG PO TABS
60.0000 mg | ORAL_TABLET | Freq: Two times a day (BID) | ORAL | Status: DC
  Administered 2017-06-21 – 2017-06-23 (×6): 60 mg via ORAL
  Filled 2017-06-21 (×8): qty 1

## 2017-06-21 MED ORDER — SODIUM CHLORIDE 0.9 % IV SOLN
2.0000 g | Freq: Once | INTRAVENOUS | Status: AC
  Administered 2017-06-21: 2 g via INTRAVENOUS
  Filled 2017-06-21: qty 2

## 2017-06-21 MED ORDER — ACETAMINOPHEN 325 MG PO TABS: 650.0000 mg | ORAL_TABLET | Freq: Four times a day (QID) | ORAL | Status: DC | PRN

## 2017-06-21 MED ORDER — GUAIFENESIN-DM 100-10 MG/5ML PO SYRP
5.0000 mL | ORAL_SOLUTION | ORAL | Status: DC | PRN
  Administered 2017-06-21 – 2017-06-24 (×5): 5 mL via ORAL
  Filled 2017-06-21 (×7): qty 5

## 2017-06-21 MED ORDER — MUPIROCIN 2 % EX OINT
1.0000 "application " | TOPICAL_OINTMENT | Freq: Two times a day (BID) | CUTANEOUS | Status: DC
  Administered 2017-06-21 – 2017-06-24 (×6): 1 via NASAL
  Filled 2017-06-21: qty 22

## 2017-06-21 MED ORDER — DIGOXIN 125 MCG PO TABS
0.1250 mg | ORAL_TABLET | Freq: Every day | ORAL | Status: DC
  Administered 2017-06-21 – 2017-06-24 (×4): 0.125 mg via ORAL
  Filled 2017-06-21 (×4): qty 1

## 2017-06-21 MED ORDER — HYDROCODONE-ACETAMINOPHEN 5-325 MG PO TABS: 1.0000 | ORAL_TABLET | ORAL | Status: DC | PRN

## 2017-06-21 MED ORDER — METRONIDAZOLE IN NACL 5-0.79 MG/ML-% IV SOLN
500.0000 mg | Freq: Once | INTRAVENOUS | Status: AC
  Administered 2017-06-21: 500 mg via INTRAVENOUS
  Filled 2017-06-21: qty 100

## 2017-06-21 MED ORDER — RISAQUAD PO CAPS
1.0000 | ORAL_CAPSULE | Freq: Every evening | ORAL | Status: DC
  Administered 2017-06-21 – 2017-06-23 (×3): 1 via ORAL
  Filled 2017-06-21 (×3): qty 1

## 2017-06-21 MED ORDER — FAMOTIDINE 20 MG PO TABS
20.0000 mg | ORAL_TABLET | Freq: Two times a day (BID) | ORAL | Status: DC
  Administered 2017-06-21 – 2017-06-24 (×7): 20 mg via ORAL
  Filled 2017-06-21 (×7): qty 1

## 2017-06-21 MED ORDER — ACETAMINOPHEN 650 MG RE SUPP: 650.0000 mg | Freq: Four times a day (QID) | RECTAL | Status: DC | PRN

## 2017-06-21 MED ORDER — SAW PALMETTO (SERENOA REPENS) 500 MG PO CAPS: 1000.0000 mg | ORAL_CAPSULE | Freq: Every evening | ORAL | Status: DC

## 2017-06-21 MED ORDER — PANTOPRAZOLE SODIUM 40 MG PO TBEC
40.0000 mg | DELAYED_RELEASE_TABLET | Freq: Every day | ORAL | Status: DC
  Administered 2017-06-22 – 2017-06-24 (×3): 40 mg via ORAL
  Filled 2017-06-21 (×3): qty 1

## 2017-06-21 MED ORDER — SODIUM CHLORIDE 0.9 % IV SOLN: 250.0000 mL | INTRAVENOUS | Status: DC | PRN

## 2017-06-21 MED ORDER — FLUTICASONE PROPIONATE 50 MCG/ACT NA SUSP
2.0000 | Freq: Every day | NASAL | Status: DC
  Administered 2017-06-22 – 2017-06-24 (×3): 2 via NASAL
  Filled 2017-06-21: qty 16

## 2017-06-21 MED ORDER — VANCOMYCIN HCL IN DEXTROSE 1-5 GM/200ML-% IV SOLN
1000.0000 mg | Freq: Once | INTRAVENOUS | Status: AC
  Administered 2017-06-21: 1000 mg via INTRAVENOUS
  Filled 2017-06-21: qty 200

## 2017-06-21 MED ORDER — ACETAMINOPHEN 500 MG PO TABS
1000.0000 mg | ORAL_TABLET | Freq: Once | ORAL | Status: AC
  Administered 2017-06-21: 1000 mg via ORAL
  Filled 2017-06-21: qty 2

## 2017-06-21 MED ORDER — SODIUM CHLORIDE 0.9% FLUSH
3.0000 mL | INTRAVENOUS | Status: DC | PRN
  Administered 2017-06-21: 3 mL via INTRAVENOUS
  Filled 2017-06-21: qty 3

## 2017-06-21 MED ORDER — GABAPENTIN 600 MG PO TABS
600.0000 mg | ORAL_TABLET | Freq: Two times a day (BID) | ORAL | Status: DC
  Administered 2017-06-21 – 2017-06-24 (×7): 600 mg via ORAL
  Filled 2017-06-21 (×7): qty 1

## 2017-06-21 MED ORDER — SODIUM CHLORIDE 0.9 % IV SOLN
3.0000 g | Freq: Four times a day (QID) | INTRAVENOUS | Status: DC
  Administered 2017-06-21 – 2017-06-22 (×5): 3 g via INTRAVENOUS
  Filled 2017-06-21 (×8): qty 3

## 2017-06-21 MED ORDER — DICYCLOMINE HCL 10 MG PO CAPS
10.0000 mg | ORAL_CAPSULE | Freq: Three times a day (TID) | ORAL | Status: DC
  Administered 2017-06-21 – 2017-06-24 (×7): 10 mg via ORAL
  Filled 2017-06-21 (×9): qty 1

## 2017-06-21 MED ORDER — CHLORHEXIDINE GLUCONATE CLOTH 2 % EX PADS
6.0000 | MEDICATED_PAD | Freq: Every day | CUTANEOUS | Status: DC
  Administered 2017-06-22: 6 via TOPICAL

## 2017-06-21 MED ORDER — IPRATROPIUM-ALBUTEROL 0.5-2.5 (3) MG/3ML IN SOLN
3.0000 mL | RESPIRATORY_TRACT | Status: DC
  Administered 2017-06-21 – 2017-06-22 (×6): 3 mL via RESPIRATORY_TRACT
  Filled 2017-06-21 (×6): qty 3

## 2017-06-21 MED ORDER — BUDESONIDE 0.25 MG/2ML IN SUSP: 0.2500 mg | Freq: Two times a day (BID) | RESPIRATORY_TRACT | Status: DC

## 2017-06-21 MED ORDER — HYDROCORTISONE 10 MG PO TABS
10.0000 mg | ORAL_TABLET | Freq: Two times a day (BID) | ORAL | Status: DC
  Administered 2017-06-21 – 2017-06-24 (×7): 10 mg via ORAL
  Filled 2017-06-21 (×8): qty 1

## 2017-06-21 MED ORDER — METHENAMINE MANDELATE 1 G PO TABS
1000.0000 mg | ORAL_TABLET | Freq: Two times a day (BID) | ORAL | Status: DC
  Administered 2017-06-21 – 2017-06-24 (×6): 1000 mg via ORAL
  Filled 2017-06-21 (×7): qty 1

## 2017-06-21 MED ORDER — SIMVASTATIN 20 MG PO TABS
20.0000 mg | ORAL_TABLET | Freq: Every evening | ORAL | Status: DC
  Administered 2017-06-21 – 2017-06-23 (×3): 20 mg via ORAL
  Filled 2017-06-21 (×3): qty 1

## 2017-06-21 MED ORDER — SODIUM CHLORIDE 0.9 % IV BOLUS
500.0000 mL | Freq: Once | INTRAVENOUS | Status: AC
  Administered 2017-06-21: 500 mL via INTRAVENOUS

## 2017-06-21 MED ORDER — MIDODRINE HCL 5 MG PO TABS
10.0000 mg | ORAL_TABLET | Freq: Three times a day (TID) | ORAL | Status: DC
  Administered 2017-06-21 – 2017-06-22 (×4): 10 mg via ORAL
  Filled 2017-06-21 (×8): qty 2

## 2017-06-21 MED ORDER — ADULT MULTIVITAMIN W/MINERALS CH
1.0000 | ORAL_TABLET | Freq: Every day | ORAL | Status: DC
  Administered 2017-06-21 – 2017-06-24 (×4): 1 via ORAL
  Filled 2017-06-21 (×4): qty 1

## 2017-06-21 NOTE — Progress Notes (Signed)
PHARMACIST - PHYSICIAN ORDER COMMUNICATION  CONCERNING: P&T Medication Policy on Herbal Medications  DESCRIPTION:  This patient's orders for:  Co-enzyme Q10 and Saw Palmetto  have been noted.  This product(s) is classified as an "herbal" or natural product. Due to a lack of definitive safety studies or FDA approval, nonstandard manufacturing practices, plus the potential risk of unknown drug-drug interactions while on inpatient medications, the Pharmacy and Therapeutics Committee does not permit the use of "herbal" or natural products of this type within [facility].   ACTION TAKEN: The pharmacy department is unable to verify this order at this time  Please reevaluate patient's clinical condition at discharge and address if the herbal or natural product(s) should be resumed at that time.

## 2017-06-21 NOTE — ED Notes (Signed)
Pt placed on 6L via [place].

## 2017-06-21 NOTE — ED Provider Notes (Signed)
[facility] Emergency Department Provider Note ____________________________________________   First MD Initiated Contact with Patient 06/21/17 1101     (approximate)  I have reviewed the triage vital signs and the nursing notes.   HISTORY  Chief Complaint Weakness    HPI Russell Howard is a 84 y.o. male with history of lung cancer, COPD, CHF, and other PMH as noted below who presents with increased generalized weakness over the last several days, associated with nausea, intermittent abdominal discomfort, and worsening shortness of breath.  Per the daughter, the patient is chronically on 2 L by nasal cannula, but despite increasing to 4 L, his O2 saturation was in the 80s on EMS arrival.  The patient was seen in the ED 2 days ago for nausea, vomiting, diarrhea, and apparent near syncope.  His work-up was relatively unrevealing at that time, and the patient was discharged home.  The daughter, he has worsened since then.  Past Medical History:  Diagnosis Date  . Acquired hypothyroidism 11/02/2014  . BPH (benign prostatic hyperplasia)   . Cardiac defibrillator in place 11/02/2014  . Cardiomyopathy ([place])   . CHF (congestive heart failure) ([place])   . Chronic diastolic heart failure ([place]) 11/02/2014  . COPD (chronic obstructive pulmonary disease) ([place])   . Gastro-esophageal reflux disease without esophagitis 11/02/2014  . H/O malignant neoplasm 11/09/2014  . Heart valve disease 11/02/2014  . History of atrial fibrillation 11/02/2014  . Lung cancer ([place])   . Neuropathy 11/02/2014  . Orthostasis 11/02/2014  . Pure hypercholesterolemia 11/02/2014  . Valvular heart disease     Patient Active Problem List   Diagnosis Date Noted  . UTI (urinary tract infection) 11/17/2016  . Empyema ([place])   . Weakness   . Community acquired pneumonia   . Pleural effusion   . Palliative care by specialist   . Acute on chronic respiratory failure with hypoxia ([place]) 08/19/2016  . Acute  respiratory failure with hypoxia ([place]) 08/19/2016  . Septic shock ([place]) 03/02/2016  . Hydronephrosis, right 01/14/2016  . Constipation 01/13/2016  . Urinary retention 01/13/2016  . Lower abdominal pain 01/13/2016  . Bacteremia 01/13/2016  . Hypotension 01/13/2016  . Palliative care encounter   . Goals of care, counseling/discussion   . Cough   . Encounter for hospice care discussion   . Hypoxia 01/09/2016  . Primary cancer of bronchus of right lower lobe ([place]) 11/29/2015  . Sepsis ([place]) 06/14/2015  . Acute lower UTI 06/14/2015    Class: Acute  . H/O malignant neoplasm 11/09/2014  . Acquired hypothyroidism 11/02/2014  . Cardiomyopathy ([place]) 11/02/2014  . Acute on chronic diastolic CHF (congestive heart failure) ([place]) 11/02/2014  . Diabetes mellitus without complication ([place]) 44/81/8563  . Cardiac defibrillator in place 11/02/2014  . Gastro-esophageal reflux disease without esophagitis 11/02/2014  . History of atrial fibrillation 11/02/2014  . BP (high blood pressure) 11/02/2014  . Neuropathy 11/02/2014  . Orthostasis 11/02/2014  . Pure hypercholesterolemia 11/02/2014  . Heart valve disease 11/02/2014    Past Surgical History:  Procedure Laterality Date  . APPENDECTOMY    . CARDIAC VALVE REPLACEMENT     with a bovine valve  . DUAL ICD IMPLANT  2007/2009   implantable cardiac defibrillator  . PORT-A-CATH REMOVAL      Prior to Admission medications   Medication Sig Start Date End Date Taking? Authorizing Provider  acidophilus (RISAQUAD) CAPS capsule Take 1 capsule by mouth every evening.    Yes [provider]  apixaban (ELIQUIS) 5 MG  TABS tablet 10 mg po bid for 3 days, then 5 mg po bid. 05/29/17  Yes Demetrios Loll, MD  Ascorbic Acid (VITAMIN C) 1000 MG tablet Take 500 mg by mouth 2 (two) times daily.    Yes [provider]  aspirin EC 81 MG tablet Take 81 mg by mouth daily.    Yes [provider]  budesonide (PULMICORT) 0.25 MG/2ML nebulizer  solution Take 2 mLs (0.25 mg total) by nebulization 2 (two) times daily. 05/29/17  Yes Demetrios Loll, MD  Cholecalciferol 5000 units TABS Take 5,000 Units by mouth daily.   Yes [provider]  ciprofloxacin (CIPRO) 500 MG tablet Take 1 tablet (500 mg total) by mouth 2 (two) times daily for 3 days. 06/19/17 06/22/17 Yes Darel Hong, MD  Coenzyme Q10 (CO Q-10) 120 MG CAPS Take 120 mg by mouth every evening.   Yes [provider]  dicyclomine (BENTYL) 10 MG capsule Take 1 capsule by mouth 3 (three) times daily. 06/19/17  Yes [provider]  digoxin (LANOXIN) 0.125 MG tablet Take 0.125 mg by mouth daily.   Yes [provider]  fluticasone (FLONASE) 50 MCG/ACT nasal spray Place 2 sprays into both nostrils daily. 05/29/17  Yes Demetrios Loll, MD  furosemide (LASIX) 20 MG tablet Take 20 mg by mouth every other day as needed for fluid.    Yes [provider]  gabapentin (NEURONTIN) 600 MG tablet Take 600 mg by mouth 2 (two) times daily.   Yes [provider]  hydrocortisone (CORTEF) 10 MG tablet Take 10 mg by mouth 2 (two) times daily.    Yes [provider]  ipratropium-albuterol (DUONEB) 0.5-2.5 (3) MG/3ML SOLN Take 3 mLs by nebulization every 4 (four) hours as needed. 05/29/17  Yes Demetrios Loll, MD  levothyroxine (SYNTHROID, LEVOTHROID) 100 MCG tablet Take 100 mcg by mouth daily before breakfast.   Yes [provider]  magnesium oxide (MAG-OX) 400 (241.3 Mg) MG tablet Take 400 mg by mouth every evening.   Yes [provider]  methenamine (MANDELAMINE) 1 g tablet Take 1,000 mg by mouth 2 (two) times daily with a meal.   Yes [provider]  midodrine (PROAMATINE) 10 MG tablet Take 1 tablet (10 mg total) by mouth 3 (three) times daily with meals. Patient taking differently: Take 10 mg by mouth 2 (two) times daily with a meal.  08/30/16  Yes Sudini, Alveta Heimlich, MD  Multiple Vitamin (MULTIVITAMIN WITH MINERALS) TABS tablet Take 1  tablet by mouth daily.   Yes [provider]  pantoprazole (PROTONIX) 40 MG tablet Take 40 mg by mouth daily before breakfast.    Yes [provider]  potassium chloride (K-DUR) 10 MEQ tablet Take 10 mEq by mouth daily as needed (for fluid retention when taking furosemide).   Yes [provider]  pyridostigmine (MESTINON) 60 MG tablet Take 60 mg by mouth 2 (two) times daily. 03/11/17  Yes [provider]  ranitidine (ZANTAC) 300 MG tablet Take 300 mg by mouth every evening.   Yes [provider]  saw palmetto 500 MG capsule Take 1,000 mg by mouth every evening.   Yes [provider]  simvastatin (ZOCOR) 20 MG tablet Take 20 mg by mouth every evening.    Yes [provider]  vitamin B-12 (CYANOCOBALAMIN) 500 MCG tablet Take 500 mcg by mouth daily.    Yes [provider]  vitamin E 400 UNIT capsule Take 400 Units by mouth every evening.    Yes [provider]  benzonatate (TESSALON) 200 MG capsule Take 1 capsule (200 mg total) by mouth 3 (three) times daily as needed for cough. Patient not taking: Reported on 06/21/2017 05/29/17   Demetrios Loll, MD  guaiFENesin-codeine [facility]) 100-10 MG/5ML syrup Take 5 mLs by mouth 3 (three) times daily as needed for cough.    [provider]  ondansetron (ZOFRAN ODT) 4 MG disintegrating tablet Take 1 tablet (4 mg total) by mouth every 8 (eight) hours as needed for nausea or vomiting. Patient not taking: Reported on 06/21/2017 04/29/17   Rudene Re, MD    Allergies Penicillin g and Sulfa antibiotics  Family History  Problem Relation Age of Onset  . Hypertension Unknown   . Heart disease Unknown   . CAD Father     Social History Social History   Tobacco Use  . Smoking status: Former Smoker    Types: Cigarettes    Last attempt to quit: 11/21/1981    Years since quitting: 35.6  . Smokeless tobacco: Never Used  Substance Use Topics  . Alcohol use: No     Alcohol/week: 0.0 oz  . Drug use: No    Review of Systems  Constitutional: Positive for subjective fever. Eyes: No redness. ENT: No sore throat. Cardiovascular: Denies chest pain. Respiratory: Positive for shortness of breath. Gastrointestinal: Positive for nausea and diarrhea. Genitourinary: Negative for dysuria.  Musculoskeletal: Negative for back pain. Skin: Negative for rash. Neurological: Negative for headache.   ____________________________________________   PHYSICAL EXAM:  VITAL SIGNS: ED Triage Vitals  Enc Vitals Group     BP 06/21/17 1041 137/82     Pulse Rate 06/21/17 1041 (!) 110     Resp 06/21/17 1041 (!) 26     Temp 06/21/17 1041 99.7 F (37.6 C)     Temp Source 06/21/17 1041 Axillary     SpO2 06/21/17 1030 (!) 88 %     Weight 06/21/17 1033 179 lb (81.2 kg)     Height 06/21/17 1033 5\' 10"  (1.778 m)     Head Circumference --      Peak Flow --      Pain Score --      Pain Loc --      Pain Edu? --      Excl. in [place]? --     Constitutional: Alert and oriented.  Frail and weak appearing but in no acute distress. Eyes: Conjunctivae are normal.  EOMI. Head: Atraumatic. Nose: No congestion/rhinnorhea. Mouth/Throat: Mucous membranes are slightly dry.   Neck: Normal range of motion.  Cardiovascular: Tachycardic, regular rhythm. Grossly normal heart sounds.  Good peripheral circulation. Respiratory: Normal respiratory effort.  No retractions.  Decreased breath sounds and rhonchi bilaterally. Gastrointestinal: Soft and nontender. No distention.  Genitourinary: No flank tenderness. Musculoskeletal: No lower extremity edema.  Extremities warm and well perfused.  Neurologic:  Normal speech and language. No gross focal neurologic deficits are appreciated.  Skin:  Skin is warm and dry. No rash noted. Psychiatric: Speech and behavior are normal.  ____________________________________________   LABS (all labs ordered are listed, but only abnormal results are  displayed)  Labs Reviewed  COMPREHENSIVE METABOLIC PANEL - Abnormal; Notable for the following components:      Result Value   Sodium 133 (*)    Chloride 100 (*)    Glucose, Bld 149 (*)    BUN 22 (*)    Calcium 8.1 (*)    Albumin 3.1 (*)    GFR calc non Af Amer 59 (*)  All other components within normal limits  LACTIC ACID, PLASMA - Abnormal; Notable for the following components:   Lactic Acid, Venous 2.3 (*)    All other components within normal limits  CBC WITH DIFFERENTIAL/PLATELET - Abnormal; Notable for the following components:   RBC 3.80 (*)    Hemoglobin 11.8 (*)    HCT 34.6 (*)    RDW 17.0 (*)    Neutro Abs 7.4 (*)    Lymphs Abs 0.6 (*)    All other components within normal limits  PROTIME-INR - Abnormal; Notable for the following components:   Prothrombin Time 17.9 (*)    All other components within normal limits  TROPONIN I - Abnormal; Notable for the following components:   Troponin I 0.74 (*)    All other components within normal limits  CULTURE, BLOOD (ROUTINE X 2)  CULTURE, BLOOD (ROUTINE X 2)  LACTIC ACID, PLASMA  URINALYSIS, COMPLETE (UACMP) WITH MICROSCOPIC  BRAIN NATRIURETIC PEPTIDE   ____________________________________________  EKG  ED ECG REPORT I, Arta Silence, the attending physician, personally viewed and interpreted this ECG.  Date: 06/21/2017 EKG Time: 1048 Rate: 110 Rhythm: Paced rhythm QRS Axis: N/a Intervals: N/A ST/T Wave abnormalities: No acute abnormalities Narrative Interpretation: no evidence of acute ischemia; no significant change when compared to EKG of 06/19/2017  ____________________________________________  RADIOLOGY  CXR: Chronic opacification of right lung.  Left lung with possible increased hazy opacity.  ____________________________________________   PROCEDURES  Procedure(s) performed: No  Procedures  Critical Care performed: Yes  CRITICAL CARE Performed by: Arta Silence   Total critical  care time: 35 minutes  Critical care time was exclusive of separately billable procedures and treating other patients.  Critical care was necessary to treat or prevent imminent or life-threatening deterioration.  Critical care was time spent personally by me on the following activities: development of treatment plan with patient and/or surrogate as well as nursing, discussions with consultants, evaluation of patient's response to treatment, examination of patient, obtaining history from patient or surrogate, ordering and performing treatments and interventions, ordering and review of laboratory studies, ordering and review of radiographic studies, pulse oximetry and re-evaluation of patient's condition.  ____________________________________________   INITIAL IMPRESSION / ASSESSMENT AND PLAN / ED COURSE  Pertinent labs & imaging results that were available during my care of the patient were reviewed by me and considered in my medical decision making (see chart for details).  83 year old male with PMH as noted above presents with worsening generalized weakness, nausea, abdominal discomfort, and shortness of breath with hypoxia despite increasing his home oxygen.  He was seen in the ED 2 days ago for vomiting, diarrhea, and weakness, but his work-up was negative and he was discharged home.  I reviewed the past medical records in epic and confirmed the history of the visit 2 days ago.  The patient was previously admitted last month for sepsis with no clear source.  On exam, the patient is weak appearing, he has a borderline temperature and is tachycardic.  His O2 saturation is in the 70s on room air, however it is in the 90s on 6 L by nasal cannula.  The remainder of the exam is as described above.  Overall presentation is most consistent with sepsis, most likely from pulmonary source.  I have some concern for aspiration given the recent history of vomiting and patient's overall poor functional  status.  Also consider UTI.  I do not suspect an intra-abdominal source, as the patient has not had persistent vomiting,  and his abdomen is soft and nontender on exam today.  Plan: Sepsis work-up, fluids and supportive care, and reassess.  Anticipate likely admission.    ----------------------------------------- 12:42 PM on 06/21/2017 -----------------------------------------  Patient's chest x-ray read as stable by radiology, however based on my interpretation there may be some increased opacity on the left.  Patient's lactate is slightly elevated.  His troponin is also elevated although there are no EKG changes.  Overall presentation is still most consistent with sepsis, possible pneumonia, and possible aspiration.  I have given broad-spectrum antibiotics to cover for H CAP, as well as Flagyl for additional coverage for aspiration pneumonia in this penicillin allergic patient.  Patient's respiratory status has stabilized and he remains in the high 90s on 6 L by nasal cannula.  Due to the hypoxia and concern for sepsis as well as the elevated troponin, patient requires admission.  I signed the patient out to the hospitalist Dr. Posey Pronto.    ____________________________________________   FINAL CLINICAL IMPRESSION(S) / ED DIAGNOSES  Final diagnoses:  Sepsis, due to unspecified organism ([place])  Hypoxia      NEW MEDICATIONS STARTED DURING THIS VISIT:  New Prescriptions   No medications on file     Note:  This document was prepared using Dragon voice recognition software and may include unintentional dictation errors.    Arta Silence, MD 06/21/17 1244

## 2017-06-21 NOTE — Progress Notes (Signed)
Pharmacy Antibiotic Note  Russell Howard is a 83 y.o. male admitted on 06/21/2017 with aspiration PNA.  Pharmacy has been consulted for Unasyn dosing.  Plan: Unasyn 3 gm IV Q6H. Patient nas noted PCN allergy - RN clarifies with patient it was rash/itching, not life threatening reaction. Please monitor while on Unasyn.  Height: 5\' 10"  (177.8 cm) Weight: 179 lb (81.2 kg) IBW/kg (Calculated) : 73  Temp (24hrs), Avg:99.7 F (37.6 C), Min:99.7 F (37.6 C), Max:99.7 F (37.6 C)  Recent Labs  Lab 06/19/17 0029 06/21/17 1045  WBC 9.0 9.0  CREATININE 1.13 1.13  LATICACIDVEN  --  2.3*    Estimated Creatinine Clearance: 53.8 mL/min (by C-G formula based on SCr of 1.13 mg/dL).    Allergies  Allergen Reactions  . Penicillin G Hives    Has patient had a PCN reaction causing immediate rash, facial/tongue/throat swelling, SOB or lightheadedness with hypotension: Yes Has patient had a PCN reaction causing severe rash involving mucus membranes or skin necrosis: No Has patient had a PCN reaction that required hospitalization No Has patient had a PCN reaction occurring within the last 10 years: No If all of the above answers are "NO", then may proceed with Cephalosporin use.  . Sulfa Antibiotics Rash    Antimicrobials this admission:   Dose adjustments this admission:   Microbiology results:  BCx:   UCx:    Sputum:    MRSA PCR:   Thank you for allowing pharmacy to be a part of this patient's care.  Laural Benes, Pharm.D., BCPS Clinical Pharmacist 06/21/2017 1:36 PM

## 2017-06-21 NOTE — Progress Notes (Signed)
Advanced care plan.  Purpose of the Encounter: Home hospice    Parties in Attendance: Patient and his daughter  Patient's Decision Capacity: Intact Subjective/Patient's story: Patient is 84 year old with history of lung cancer in the past chronic interstitial lung disease, pulmonary fibrosis and COPD presenting to the ED with worsening shortness of breath nausea vomiting   Objective/Medical story I discussed with the patient regarding overall poor prognosis, discussed with the patient regarding notes from his primary pulmonologist describing his lung disease as being severe and no option for treatment. I recommended possible comfort care measures, however patient and daughter want aggressive therapy feel that this is an acute episode and would like aggressive treatment.  I will asked palliative care team to see again in the hospital  Goals of care determination: Continue current care    CODE STATUS: DNR   Time spent discussing advanced care planning: 16 minutes

## 2017-06-21 NOTE — H&P (Signed)
[facility] [place] NAME: Russell Howard    MR#:  [id number]  DATE OF BIRTH:  [birth year]  DATE OF ADMISSION:  06/21/2017  PRIMARY CARE PHYSICIAN: [name], MD   REQUESTING/REFERRING PHYSICIAN:   CHIEF COMPLAINT:   Chief Complaint  Patient presents with  . Weakness    HISTORY OF PRESENT ILLNESS: Russell Howard  is a 83 y.o. male with a known history of lung cancer in the past with extensive right lung radiation fibrosis, chronic pulmonary embolism, right-sided pleural effusion on chronic oxygen therapy with 2 L presents to the ED with generalized weakness vomiting and worsening hypoxia.  Patient was seen in the ED few days ago with complaint of diarrhea and was subsequently discharged home.  He returns today because he was vomiting and may have aspirated.  Now his oxygen requirements are much higher than normal.  Patient currently on 5 L of oxygen.  He states that he has not had diarrhea today.  Denies any fevers chills no chest pain.  He was seen by his primary partner pulmonologist on 05/06 and was told that his chronic lung issues are not treatable and consider having hospice following the patient.  According to the daughter and patient patient has palliative care team following him.  He currently does not have hospice following him.       PAST MEDICAL HISTORY:   Past Medical History:  Diagnosis Date  . Acquired hypothyroidism 11/02/2014  . BPH (benign prostatic hyperplasia)   . Cardiac defibrillator in place 11/02/2014  . Cardiomyopathy ([place])   . CHF (congestive heart failure) ([place])   . Chronic diastolic heart failure ([place]) 11/02/2014  . COPD (chronic obstructive pulmonary disease) ([place])   . Gastro-esophageal reflux disease without esophagitis 11/02/2014  . H/O malignant neoplasm 11/09/2014  . Heart valve disease 11/02/2014  . History of atrial fibrillation 11/02/2014  . Lung cancer ([place])   . Neuropathy 11/02/2014  . Orthostasis  11/02/2014  . Pure hypercholesterolemia 11/02/2014  . Valvular heart disease     PAST SURGICAL HISTORY:  Past Surgical History:  Procedure Laterality Date  . APPENDECTOMY    . CARDIAC VALVE REPLACEMENT     with a bovine valve  . DUAL ICD IMPLANT  2007/2009   implantable cardiac defibrillator  . PORT-A-CATH REMOVAL      SOCIAL HISTORY:  Social History   Tobacco Use  . Smoking status: Former Smoker    Types: Cigarettes    Last attempt to quit: 11/21/1981    Years since quitting: 35.6  . Smokeless tobacco: Never Used  Substance Use Topics  . Alcohol use: No    Alcohol/week: 0.0 oz    FAMILY HISTORY:  Family History  Problem Relation Age of Onset  . Hypertension Unknown   . Heart disease Unknown   . CAD Father     DRUG ALLERGIES:  Allergies  Allergen Reactions  . Penicillin G Hives    Has patient had a PCN reaction causing immediate rash, facial/tongue/throat swelling, SOB or lightheadedness with hypotension: Yes Has patient had a PCN reaction causing severe rash involving mucus membranes or skin necrosis: No Has patient had a PCN reaction that required hospitalization No Has patient had a PCN reaction occurring within the last 10 years: No If all of the above answers are "NO", then may proceed with Cephalosporin use.  . Sulfa Antibiotics Rash    REVIEW OF SYSTEMS:   CONSTITUTIONAL: No fever, fatigue or weakness.  EYES:  No blurred or double vision.  EARS, NOSE, AND THROAT: No tinnitus or ear pain.  RESPIRATORY: No cough, positive shortness of breath, wheezing or hemoptysis.  CARDIOVASCULAR: No chest pain, orthopnea, edema.  GASTROINTESTINAL: No nausea, positive vomiting, positive diarrhea or no abdominal pain.  GENITOURINARY: No dysuria, hematuria.  ENDOCRINE: No polyuria, nocturia,  HEMATOLOGY: No anemia, easy bruising or bleeding SKIN: No rash or lesion. MUSCULOSKELETAL: No joint pain or arthritis.   NEUROLOGIC: No tingling, numbness, weakness.  PSYCHIATRY:  No anxiety or depression.   MEDICATIONS AT HOME:  Prior to Admission medications   Medication Sig Start Date End Date Taking? Authorizing Provider  acidophilus (RISAQUAD) CAPS capsule Take 1 capsule by mouth every evening.    Yes [provider]  apixaban (ELIQUIS) 5 MG TABS tablet 10 mg po bid for 3 days, then 5 mg po bid. 05/29/17  Yes Demetrios Loll, MD  Ascorbic Acid (VITAMIN C) 1000 MG tablet Take 500 mg by mouth 2 (two) times daily.    Yes [provider]  aspirin EC 81 MG tablet Take 81 mg by mouth daily.    Yes [provider]  budesonide (PULMICORT) 0.25 MG/2ML nebulizer solution Take 2 mLs (0.25 mg total) by nebulization 2 (two) times daily. 05/29/17  Yes Demetrios Loll, MD  Cholecalciferol 5000 units TABS Take 5,000 Units by mouth daily.   Yes [provider]  ciprofloxacin (CIPRO) 500 MG tablet Take 1 tablet (500 mg total) by mouth 2 (two) times daily for 3 days. 06/19/17 06/22/17 Yes Darel Hong, MD  Coenzyme Q10 (CO Q-10) 120 MG CAPS Take 120 mg by mouth every evening.   Yes [provider]  dicyclomine (BENTYL) 10 MG capsule Take 1 capsule by mouth 3 (three) times daily. 06/19/17  Yes [provider]  digoxin (LANOXIN) 0.125 MG tablet Take 0.125 mg by mouth daily.   Yes [provider]  fluticasone (FLONASE) 50 MCG/ACT nasal spray Place 2 sprays into both nostrils daily. 05/29/17  Yes Demetrios Loll, MD  furosemide (LASIX) 20 MG tablet Take 20 mg by mouth every other day as needed for fluid.    Yes [provider]  gabapentin (NEURONTIN) 600 MG tablet Take 600 mg by mouth 2 (two) times daily.   Yes [provider]  hydrocortisone (CORTEF) 10 MG tablet Take 10 mg by mouth 2 (two) times daily.    Yes [provider]  ipratropium-albuterol (DUONEB) 0.5-2.5 (3) MG/3ML SOLN Take 3 mLs by nebulization every 4 (four) hours as needed. 05/29/17  Yes Demetrios Loll, MD  levothyroxine (SYNTHROID, LEVOTHROID) 100 MCG tablet  Take 100 mcg by mouth daily before breakfast.   Yes [provider]  magnesium oxide (MAG-OX) 400 (241.3 Mg) MG tablet Take 400 mg by mouth every evening.   Yes [provider]  methenamine (MANDELAMINE) 1 g tablet Take 1,000 mg by mouth 2 (two) times daily with a meal.   Yes [provider]  midodrine (PROAMATINE) 10 MG tablet Take 1 tablet (10 mg total) by mouth 3 (three) times daily with meals. Patient taking differently: Take 10 mg by mouth 2 (two) times daily with a meal.  08/30/16  Yes Sudini, Alveta Heimlich, MD  Multiple Vitamin (MULTIVITAMIN WITH MINERALS) TABS tablet Take 1 tablet by mouth daily.   Yes [provider]  pantoprazole (PROTONIX) 40 MG tablet Take 40 mg by mouth daily before breakfast.    Yes [provider]  potassium chloride (K-DUR) 10 MEQ tablet Take 10 mEq by mouth  daily as needed (for fluid retention when taking furosemide).   Yes [provider]  pyridostigmine (MESTINON) 60 MG tablet Take 60 mg by mouth 2 (two) times daily. 03/11/17  Yes [provider]  ranitidine (ZANTAC) 300 MG tablet Take 300 mg by mouth every evening.   Yes [provider]  saw palmetto 500 MG capsule Take 1,000 mg by mouth every evening.   Yes [provider]  simvastatin (ZOCOR) 20 MG tablet Take 20 mg by mouth every evening.    Yes [provider]  vitamin B-12 (CYANOCOBALAMIN) 500 MCG tablet Take 500 mcg by mouth daily.    Yes [provider]  vitamin E 400 UNIT capsule Take 400 Units by mouth every evening.    Yes [provider]  benzonatate (TESSALON) 200 MG capsule Take 1 capsule (200 mg total) by mouth 3 (three) times daily as needed for cough. Patient not taking: Reported on 06/21/2017 05/29/17   Demetrios Loll, MD  guaiFENesin-codeine [facility]) 100-10 MG/5ML syrup Take 5 mLs by mouth 3 (three) times daily as needed for cough.    [provider]  ondansetron (ZOFRAN ODT) 4 MG  disintegrating tablet Take 1 tablet (4 mg total) by mouth every 8 (eight) hours as needed for nausea or vomiting. Patient not taking: Reported on 06/21/2017 04/29/17   Rudene Re, MD      PHYSICAL EXAMINATION:   VITAL SIGNS: Blood pressure 130/78, pulse 94, temperature 99.7 F (37.6 C), temperature source Axillary, resp. rate 18, height 5\' 10"  (1.778 m), weight 81.2 kg (179 lb), SpO2 98 %.  GENERAL:  83 y.o.-year-old patient lying in the bed with no acute distress.  EYES: Pupils equal, round, reactive to light and accommodation. No scleral icterus. Extraocular muscles intact.  HEENT: Head atraumatic, normocephalic. Oropharynx and nasopharynx clear.  NECK:  Supple, no jugular venous distention. No thyroid enlargement, no tenderness.  LUNGS: Rhonchus breath sounds bilaterally no accessory muscle usage CARDIOVASCULAR: S1, S2 normal. No murmurs, rubs, or gallops.  ABDOMEN: Soft, nontender, nondistended. Bowel sounds present. No organomegaly or mass.  EXTREMITIES: No pedal edema, cyanosis, or clubbing.  NEUROLOGIC: Cranial nerves II through XII are intact. Muscle strength 5/5 in all extremities. Sensation intact. Gait not checked.  PSYCHIATRIC: The patient is alert and oriented x 3.  SKIN: No obvious rash, lesion, or ulcer.   LABORATORY PANEL:   CBC Recent Labs  Lab 06/19/17 0029 06/21/17 1045  WBC 9.0 9.0  HGB 14.2 11.8*  HCT 42.7 34.6*  PLT 263 222  MCV 90.9 90.9  MCH 30.3 31.1  MCHC 33.3 34.2  RDW 16.3* 17.0*  LYMPHSABS 1.3 0.6*  MONOABS 0.9 0.9  EOSABS 0.4 0.0  BASOSABS 0.0 0.0   ------------------------------------------------------------------------------------------------------------------  Chemistries  Recent Labs  Lab 06/19/17 0029 06/21/17 1045  NA 140 133*  K 4.0 4.7  CL 103 100*  CO2 27 24  GLUCOSE 147* 149*  BUN 15 22*  CREATININE 1.13 1.13  CALCIUM 9.2 8.1*  AST 27 38  ALT 20 25  ALKPHOS 90 67  BILITOT 0.8 1.0    ------------------------------------------------------------------------------------------------------------------ estimated creatinine clearance is 53.8 mL/min (by C-G formula based on SCr of 1.13 mg/dL). ------------------------------------------------------------------------------------------------------------------ No results for input(s): TSH, T4TOTAL, T3FREE, THYROIDAB in the last 72 hours.  Invalid input(s): FREET3   Coagulation profile Recent Labs  Lab 06/21/17 1045  INR 1.49   ------------------------------------------------------------------------------------------------------------------- No results for input(s): DDIMER in the last 72 hours. -------------------------------------------------------------------------------------------------------------------  Cardiac Enzymes Recent Labs  Lab 06/19/17 0029  06/21/17 1138  TROPONINI <0.03 0.74*   ------------------------------------------------------------------------------------------------------------------ Invalid input(s): POCBNP  ---------------------------------------------------------------------------------------------------------------  Urinalysis    Component Value Date/Time   COLORURINE YELLOW (A) 05/22/2017 1003   APPEARANCEUR CLEAR (A) 05/22/2017 1003   APPEARANCEUR Cloudy (A) 02/24/2017 1400   LABSPEC 1.013 05/22/2017 1003   PHURINE 7.0 05/22/2017 1003   GLUCOSEU NEGATIVE 05/22/2017 1003   HGBUR NEGATIVE 05/22/2017 1003   BILIRUBINUR NEGATIVE 05/22/2017 1003   BILIRUBINUR Negative 02/24/2017 1400   KETONESUR NEGATIVE 05/22/2017 1003   PROTEINUR NEGATIVE 05/22/2017 1003   NITRITE NEGATIVE 05/22/2017 1003   LEUKOCYTESUR SMALL (A) 05/22/2017 1003   LEUKOCYTESUR 1+ (A) 02/24/2017 1400     RADIOLOGY: Dg Chest 2 View  Result Date: 06/21/2017 CLINICAL DATA:  Weakness and shortness of breath. History of lung cancer. EXAM: CHEST - 2 VIEW COMPARISON:  Chest x-ray dated Jun 19, 2017. FINDINGS:  Unchanged left chest wall AICD. Stable cardiomediastinal silhouette status post aortic valve replacement. Unchanged volume loss in the right hemithorax with rightward mediastinal shift. Chronic moderate right pleural effusion and underlying interstitial disease in the right lung, similar to prior study. Coarsened interstitial opacities in the left lung are also unchanged. No left pleural effusion. No pneumothorax. No acute osseous abnormality. IMPRESSION: 1. Stable chest. Unchanged moderate right pleural effusion, right hemithorax volume loss, and chronic interstitial lung disease. Electronically Signed   By: Titus Dubin M.D.   On: 06/21/2017 11:33    EKG: Orders placed or performed during the hospital encounter of 06/21/17  . EKG 12-Lead  . EKG 12-Lead    IMPRESSION AND PLAN: Patient is a 83 year old presenting with vomiting noted to have worsening respiratory failure  1.  Acute on chronic respiratory failure likely due to combination of COPD exasperation and possible aspiration pneumonia I will check procalcitonin level Nebulizer therapy I will ask pulmonary to to reevaluate his prognosis very poor Patient may need thoracentesis on the right: Currently wants to hold off on that for now  Continue Pulmicort  2.  Nausea vomiting diarrhea if symptoms persist consider doing CT scan of the abdomen currently diarrhea is resolved  3.  Elevated troponin: Unclear if this is an non-ST MI or related to his acute respiratory failure and chronic pulmonary embolism however his troponin has been negative in the past Continue aspirin therapy cardiology evaluation   4.  Chronic pulmonary embolism continue Eliquis  5.  Paroxysmal atrial fibrillation continue digoxin  6.  GERD continue PPI  7.  BPH with urinary retention continue in and out cath is doing before  8.  Chronic hypotension continue hydrocortisone and hydrating      All the records are reviewed and case discussed with ED  provider. Management plans discussed with the patient, family and they are in agreement.  CODE STATUS: Code Status History    Date Active Date Inactive Code Status Order ID Comments User Context   05/22/2017 1609 05/29/2017 1740 DNR [id number]  Gladstone Lighter, MD Inpatient   11/17/2016 1631 11/20/2016 1602 DNR [id number]  Vaughan Basta, MD Inpatient   08/19/2016 1315 08/30/2016 1728 DNR [id number]  Harrie Foreman, MD Inpatient   03/02/2016 2236 03/05/2016 2020 DNR [id number]  Mikael Spray, NP ED   01/09/2016 0658 01/14/2016 1939 DNR [id number]  Harrie Foreman, MD Inpatient   11/09/2015 0318 11/12/2015 1713 DNR [id number]  Harrie Foreman, MD Inpatient   06/14/2015 1639 06/18/2015 1513 DNR [id number]  Demetrios Loll, MD Inpatient    Questions for Most Recent Historical Code Status (Order  [id number])    Question Answer Comment   In the event of cardiac or respiratory ARREST Do not call a "code blue"    In the event of cardiac or respiratory ARREST Do not perform Intubation, CPR, defibrillation or ACLS    In the event of cardiac or respiratory ARREST Use medication by any route, position, wound care, and other measures to relive pain and suffering. May use oxygen, suction and manual treatment of airway obstruction as needed for comfort.        TOTAL TIME TAKING CARE OF THIS PATIENT: 74minutes.    Dustin Flock M.D on 06/21/2017 at 12:49 PM  Between 7am to 6pm - Pager - [phone]  After 6pm go to [email or web address] - password [email or web address] [facility] Office  [phone]  CC: Primary care physician; [name], MD

## 2017-06-21 NOTE — ED Triage Notes (Signed)
Pt presents to ED via [facility] with c/o generalized weakness. Was seen on 5/17 with c/o N/V/D, today c/o generalized weakness and nausea and abdominal discomfort. Per EMS pt has hx of lung cancer, with chronic cough. EMS gave 125cc of NS, 4mg  Zofran IV. Per EMS 88% on 4L, pt normally on 2L, pt 97% on 6L via [place].

## 2017-06-21 NOTE — ED Notes (Addendum)
Lab called to report elevated Lactic level of 2.3 mmol/L; Dr Cherylann Banas made aware.

## 2017-06-21 NOTE — ED Notes (Signed)
Dr Cherylann Banas made aware of pt's elevated Troponin as reported by lab at this time= Troponin 0.74ng/mL

## 2017-06-22 DIAGNOSIS — J9601 Acute respiratory failure with hypoxia: Secondary | ICD-10-CM

## 2017-06-22 LAB — BASIC METABOLIC PANEL
ANION GAP: 7 (ref 5–15)
BUN: 21 mg/dL — ABNORMAL HIGH (ref 6–20)
CHLORIDE: 103 mmol/L (ref 101–111)
CO2: 26 mmol/L (ref 22–32)
CREATININE: 1.01 mg/dL (ref 0.61–1.24)
Calcium: 8.1 mg/dL — ABNORMAL LOW (ref 8.9–10.3)
GFR calc non Af Amer: >60 mL/min (ref >60)
Glucose, Bld: 187 mg/dL — ABNORMAL HIGH (ref 65–99)
POTASSIUM: 4.5 mmol/L (ref 3.5–5.1)
SODIUM: 136 mmol/L (ref 135–145)

## 2017-06-22 LAB — CBC
HCT: 31.9 % — ABNORMAL LOW (ref 40.0–52.0)
HEMOGLOBIN: 10.8 g/dL — AB (ref 13.0–18.0)
MCH: 30.7 pg (ref 26.0–34.0)
MCHC: 34 g/dL (ref 32.0–36.0)
MCV: 90.3 fL (ref 80.0–100.0)
Platelets: 207 10*3/uL (ref 150–440)
RBC: 3.54 MIL/uL — AB (ref 4.40–5.90)
RDW: 16.3 % — ABNORMAL HIGH (ref 11.5–14.5)
WBC: 8.3 10*3/uL (ref 3.8–10.6)

## 2017-06-22 LAB — TROPONIN I
TROPONIN I: 0.53 ng/mL — AB (ref <0.03)
TROPONIN I: 0.48 ng/mL — AB (ref <0.03)

## 2017-06-22 MED ORDER — IPRATROPIUM-ALBUTEROL 0.5-2.5 (3) MG/3ML IN SOLN
3.0000 mL | Freq: Four times a day (QID) | RESPIRATORY_TRACT | Status: DC
  Filled 2017-06-22: qty 3

## 2017-06-22 NOTE — Progress Notes (Signed)
[place] [facility] [place] NAME: Russell Howard    MR#:  [id number]  DATE OF BIRTH:  09-20-1934  SUBJECTIVE:   No new issues for RN. Resting comfortably. On 2 L nasal cannula oxygen. No respiratory distress. REVIEW OF SYSTEMS:   Review of Systems  Unable to perform ROS: Medical condition  :   DRUG ALLERGIES:   Allergies  Allergen Reactions  . Penicillin G Hives    Has patient had a PCN reaction causing immediate rash, facial/tongue/throat swelling, SOB or lightheadedness with hypotension: Yes Has patient had a PCN reaction causing severe rash involving mucus membranes or skin necrosis: No Has patient had a PCN reaction that required hospitalization No Has patient had a PCN reaction occurring within the last 10 years: No If all of the above answers are "NO", then may proceed with Cephalosporin use.  . Sulfa Antibiotics Rash    VITALS:  Blood pressure 135/63, pulse 80, temperature 98.2 F (36.8 C), temperature source Oral, resp. rate 16, height 5\' 10"  (1.778 m), weight 78.5 kg (173 lb), SpO2 94 %.  PHYSICAL EXAMINATION:   Physical Exam  GENERAL:  83 y.o.-year-old patient lying in the bed with no acute distress. Appears chronically ill week EYES: Pupils equal, round, reactive to light and accommodation. No scleral icterus. Extraocular muscles intact.  HEENT: Head atraumatic, normocephalic. Oropharynx and nasopharynx clear.  NECK:  Supple, no jugular venous distention. No thyroid enlargement, no tenderness.  LUNGS: coarse breath sounds bilaterally, no wheezing, rales, rhonchi. No use of accessory muscles of respiration.  CARDIOVASCULAR: S1, S2 normal. No murmurs, rubs, or gallops.  ABDOMEN: Soft, nontender, nondistended. Bowel sounds present. No organomegaly or mass.  EXTREMITIES: No cyanosis, clubbing or edema b/l.    NEUROLOGIC: grossly nonfocal no focal Motor or sensory deficits b/l.   PSYCHIATRIC:  patient is alert SKIN: No  obvious rash, lesion, or ulcer.   LABORATORY PANEL:  CBC Recent Labs  Lab 06/22/17 0023  WBC 8.3  HGB 10.8*  HCT 31.9*  PLT 207    Chemistries  Recent Labs  Lab 06/21/17 1045 06/22/17 0023  NA 133* 136  K 4.7 4.5  CL 100* 103  CO2 24 26  GLUCOSE 149* 187*  BUN 22* 21*  CREATININE 1.13 1.01  CALCIUM 8.1* 8.1*  AST 38  --   ALT 25  --   ALKPHOS 67  --   BILITOT 1.0  --    Cardiac Enzymes Recent Labs  Lab 06/22/17 0704  TROPONINI 0.48*   RADIOLOGY:  Dg Chest 2 View  Result Date: 06/21/2017 CLINICAL DATA:  Weakness and shortness of breath. History of lung cancer. EXAM: CHEST - 2 VIEW COMPARISON:  Chest x-ray dated Jun 19, 2017. FINDINGS: Unchanged left chest wall AICD. Stable cardiomediastinal silhouette status post aortic valve replacement. Unchanged volume loss in the right hemithorax with rightward mediastinal shift. Chronic moderate right pleural effusion and underlying interstitial disease in the right lung, similar to prior study. Coarsened interstitial opacities in the left lung are also unchanged. No left pleural effusion. No pneumothorax. No acute osseous abnormality. IMPRESSION: 1. Stable chest. Unchanged moderate right pleural effusion, right hemithorax volume loss, and chronic interstitial lung disease. Electronically Signed   By: Titus Dubin M.D.   On: 06/21/2017 11:33   ASSESSMENT AND PLAN:   Daivik Overley  is a 83 y.o. male with a known history of lung cancer in the past with extensive right lung radiation fibrosis, chronic pulmonary embolism, right-sided  pleural effusion on chronic oxygen therapy with 2 L presents to the ED with generalized weakness vomiting and worsening hypoxia.  Patient was seen in the ER 2 days ago  1.  Acute on chronic respiratory failure likely due to combination of COPD exaceration and possible aspiration pneumonia I-procalcitonin level 0.17, WBC normal--d/c abxs  Nebulizer therapy I appreciate pulmonary input from Dr.  Ashby Dawes . Continue Pulmicort  2.  Nausea vomiting diarrhea if symptoms persist consider doing CT scan of the abdomen currently diarrhea is resolved  3.  Elevated troponin:  appears to be demand ischemia in the setting of his acute respiratory failure and chronic pulmonary embolism however his troponin has been negative in the past Continue aspirin therapy  consider cardiology evaluation of patient has chest pain  4.  Chronic pulmonary embolism  continue Eliquis  5.  Paroxysmal atrial fibrillation  continue digoxin  6.  GERD continue PPI  7.  BPH with urinary retention continue in and out cath is doing before  8.  Chronic hypotension continue hydrocortisone and hydrating  palliative consultation appreciated. Plans to meet family tomorrow  Case discussed with Care Management/Social Worker. Management plans discussed with the patient, family and they are in agreement.  CODE STATUS: DNR  DVT Prophylaxis: eliquis  TOTAL TIME TAKING CARE OF THIS PATIENT: *25* minutes.  >50% time spent on counselling and coordination of care  POSSIBLE D/C IN *1-2* DAYS, DEPENDING ON CLINICAL CONDITION.  Note: This dictation was prepared with Dragon dictation along with smaller phrase technology. Any transcriptional errors that result from this process are unintentional.  Fritzi Mandes M.D on 06/22/2017 at 4:52 PM  Between 7am to 6pm - Pager - [phone]  After 6pm go to [email or web address] - password [email or web address] [facility]  Office  [phone]  CC: Primary care physician; [name], MDPatient ID: Milus Height, male   DOB: October 21, 1934, 83 y.o.   MRN: [id number]

## 2017-06-22 NOTE — Progress Notes (Signed)
No charge note:   Palliative consult received. Chart reviewed.   Noted patient is also followed by [facility] outpatient Palliative.   Meeting planned for tomorrow morning, 12/21 at [place], AGNP-C Palliative Medicine  Please call Palliative Medicine team phone with any questions [phone]. For individual providers please see AMION.

## 2017-06-22 NOTE — Consult Note (Signed)
[facility] Consultation      Assessment and Plan:  Acute on chronic toxic respiratory failure, appears to be doing better, currently the patient is on his usual home oxygen flow rate. Severe radiation fibrosis. Chronic loculated pleural effusion. Systolic congestive heart failure with ejection fraction 45%.  - Continue empiric treatment, advance activity as tolerated. - Agree with palliative care consultation, though patient has previously declined home hospice/palliative care. -Can likely DC soon.    Date: 06/22/2017  MRN# [id number] Russell Howard [birth year]  Referring Physician: Dr. Posey Pronto.   Russell Howard is a 83 y.o. old male seen in consultation for chief complaint of:    Chief Complaint  Patient presents with  . Weakness    HPI:  The patient is a 83 year old male, sees Dr. 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  He is maintained on hydrocortisone 10 mg twice daily he was previously hospitalized last month, was found to have a large chronic right main pulmonary embolism, chronic right pleural effusion.  He was felt that the patient had a poor prognosis, palliative care was consulted, however the patient declined any palliative or hospice services. He returned to the hospital on 5/17 by EMS after a syncopal episode.  Patient's was found by his daughter in the restroom and was unconscious for several minutes.  His troponin and lactic acid had a slight bump.  He was subsequently admitted to the hospital for presumed pneumonia, possibly due to aspiration.  He is being maintained on Eliquis.  Currently the patient is being maintained on 2 L nasal cannula  Images personally reviewed, CT chest 05/23/2017, right pulmonary embolism noted, chronic changes of  bronchiectasis throughout both lungs, large and highly loculated right-sided pleural effusion with changes of fibrosis throughout the right lung.  PMHX:   Past Medical History:  Diagnosis Date  . Acquired hypothyroidism 11/02/2014  . BPH (benign prostatic hyperplasia)   . Cardiac defibrillator in place 11/02/2014  . Cardiomyopathy ([place])   . CHF (congestive heart failure) ([place])   . Chronic diastolic heart failure ([place]) 11/02/2014  . COPD (chronic obstructive pulmonary disease) ([place])   . Gastro-esophageal reflux disease without esophagitis 11/02/2014  . H/O malignant neoplasm 11/09/2014  . Heart valve disease 11/02/2014  . History of atrial fibrillation 11/02/2014  . Lung cancer ([place])   . Neuropathy 11/02/2014  . Orthostasis 11/02/2014  . Pure hypercholesterolemia 11/02/2014  . Valvular heart disease    Surgical Hx:  Past Surgical History:  Procedure Laterality Date  . APPENDECTOMY    . CARDIAC VALVE REPLACEMENT     with a bovine valve  . DUAL ICD IMPLANT  2007/2009   implantable cardiac defibrillator  . PORT-A-CATH REMOVAL     Family Hx:  Family History  Problem Relation Age of Onset  . Hypertension Unknown   . Heart disease Unknown   . CAD Father    Social Hx:   Social History   Tobacco Use  . Smoking status: Former Smoker    Types: Cigarettes    Last attempt to quit: 11/21/1981    Years since quitting: 35.6  . Smokeless tobacco: Never Used  Substance Use Topics  . Alcohol use: No    Alcohol/week: 0.0 oz  . Drug use: No   Medication:    Current Facility-Administered Medications:  .  0.9 %  sodium chloride infusion, 250 mL, Intravenous, PRN, Dustin Flock, MD .  acetaminophen (TYLENOL) tablet 650 mg, 650 mg, Oral, Q6H PRN **OR** acetaminophen (TYLENOL) suppository 650 mg, 650 mg, Rectal, Q6H PRN, Dustin Flock, MD .  acidophilus (RISAQUAD) capsule 1 capsule, 1 capsule, Oral, QPM, Dustin Flock, MD, 1 capsule at 06/21/17 1730 .  Ampicillin-Sulbactam (UNASYN) 3 g  in sodium chloride 0.9 % 100 mL IVPB, 3 g, Intravenous, Q6H, Dustin Flock, MD, Stopped at 06/22/17 [phone] .  apixaban (ELIQUIS) tablet 5 mg, 5 mg, Oral, BID, Dustin Flock, MD, 5 mg at 06/22/17 0940 .  aspirin EC tablet 81 mg, 81 mg, Oral, Daily, Dustin Flock, MD, 81 mg at 06/22/17 0939 .  benzonatate (TESSALON) capsule 200 mg, 200 mg, Oral, TID PRN, Dustin Flock, MD .  budesonide (PULMICORT) nebulizer solution 0.25 mg, 0.25 mg, Nebulization, BID, Dustin Flock, MD, 0.25 mg at 06/21/17 2040 .  Chlorhexidine Gluconate Cloth 2 % PADS 6 each, 6 each, Topical, Q0600, Dustin Flock, MD, 6 each at 06/22/17 [phone] .  cholecalciferol (VITAMIN D) tablet 5,000 Units, 5,000 Units, Oral, Daily, Dustin Flock, MD .  dicyclomine (BENTYL) capsule 10 mg, 10 mg, Oral, TID, Dustin Flock, MD, 10 mg at 06/22/17 0940 .  digoxin (LANOXIN) tablet 0.125 mg, 0.125 mg, Oral, Daily, Dustin Flock, MD, 0.125 mg at 06/22/17 0939 .  famotidine (PEPCID) tablet 20 mg, 20 mg, Oral, BID, Dustin Flock, MD, 20 mg at 06/22/17 0940 .  fluticasone (FLONASE) 50 MCG/ACT nasal spray 2 spray, 2 spray, Each Nare, Daily, Dustin Flock, MD, 2 spray at 06/22/17 [phone]  gabapentin (NEURONTIN) tablet 600 mg, 600 mg, Oral, BID, Dustin Flock, MD, 600 mg at 06/22/17 0940 .  guaiFENesin (MUCINEX) 12 hr tablet 600 mg, 600 mg, Oral, BID, Dustin Flock, MD, 600 mg at 06/22/17 0939 .  guaiFENesin-dextromethorphan (ROBITUSSIN DM) 100-10 MG/5ML syrup 5 mL, 5 mL, Oral, Q4H PRN, Dustin Flock, MD, 5 mL at 06/22/17 0949 .  HYDROcodone-acetaminophen (NORCO/VICODIN) 5-325 MG per tablet 1-2 tablet, 1-2 tablet, Oral, Q4H PRN, Dustin Flock, MD .  hydrocortisone (CORTEF) tablet 10 mg, 10 mg, Oral, BID, Dustin Flock, MD, 10 mg at 06/22/17 0939 .  ipratropium-albuterol (DUONEB) 0.5-2.5 (3) MG/3ML nebulizer solution 3 mL, 3 mL, Nebulization, Q4H, Dustin Flock, MD, 3 mL at 06/22/17 1110 .  levothyroxine (SYNTHROID, LEVOTHROID)  tablet 100 mcg, 100 mcg, Oral, QAC breakfast, Dustin Flock, MD, 100 mcg at 06/22/17 [phone] .  magnesium oxide (MAG-OX) tablet 400 mg, 400 mg, Oral, QPM, Dustin Flock, MD, 400 mg at 06/21/17 1730 .  MEDLINE mouth rinse, 15 mL, Mouth Rinse, BID, Dustin Flock, MD .  methenamine (MANDELAMINE) tablet 1,000 mg, 1,000 mg, Oral, BID WC, Dustin Flock, MD, 1,000 mg at 06/22/17 3875 .  midodrine (PROAMATINE) tablet 10 mg, 10 mg, Oral, TID WC, Dustin Flock, MD, 10 mg at 06/22/17 6433 .  multivitamin with minerals tablet 1 tablet, 1 tablet, Oral, Daily, Dustin Flock, MD, 1 tablet at 06/22/17 [phone]  mupirocin ointment (BACTROBAN) 2 % 1 application, 1 application, Nasal, BID, Dustin Flock, MD, 1 application at 88/41/66 [phone] .  ondansetron (ZOFRAN-ODT) disintegrating tablet 4 mg, 4 mg, Oral, Q8H PRN, Dustin Flock, MD .  pantoprazole (PROTONIX) EC tablet 40 mg, 40 mg, Oral, QAC breakfast, Dustin Flock, MD, 40 mg at 06/22/17 1601 .  potassium chloride (K-DUR,KLOR-CON) CR tablet 10 mEq, 10 mEq, Oral, Daily PRN, Dustin Flock, MD .  pyridostigmine (MESTINON) tablet 60 mg, 60 mg, Oral, BID, Posey Pronto, [place],  MD, 60 mg at 06/22/17 0940 .  simvastatin (ZOCOR) tablet 20 mg, 20 mg, Oral, QPM, Dustin Flock, MD, 20 mg at 06/21/17 1730 .  sodium chloride flush (NS) 0.9 % injection 3 mL, 3 mL, Intravenous, Q12H, Dustin Flock, MD, 3 mL at 06/22/17 0940 .  sodium chloride flush (NS) 0.9 % injection 3 mL, 3 mL, Intravenous, PRN, Dustin Flock, MD, 3 mL at 06/21/17 2101 .  vitamin B-12 (CYANOCOBALAMIN) tablet 500 mcg, 500 mcg, Oral, Daily, Dustin Flock, MD, 500 mcg at 06/22/17 0940 .  vitamin C (ASCORBIC ACID) tablet 500 mg, 500 mg, Oral, BID, Dustin Flock, MD, 500 mg at 06/22/17 0940 .  vitamin E capsule 400 Units, 400 Units, Oral, QPM, Dustin Flock, MD, 400 Units at 06/21/17 1730   Allergies:  Penicillin g and Sulfa antibiotics  Review of Systems: Gen:  Denies  fever, sweats,  chills HEENT: Denies blurred vision, double vision. bleeds, sore throat Cvc:  No dizziness, chest pain. Resp:   Denies cough or sputum production, shortness of breath Gi: Denies swallowing difficulty, stomach pain. Gu:  Denies bladder incontinence, burning urine Ext:   No Joint pain, stiffness. Skin: No skin rash,  hives  Endoc:  No polyuria, polydipsia. Psych: No depression, insomnia. Other:  All other systems were reviewed with the patient and were negative other that what is mentioned in the HPI.   Physical Examination:   VS: BP (!) 111/53 (BP Location: Right Arm)   Pulse 92   Temp 97.7 F (36.5 C) (Oral)   Resp 16   Ht 5\' 10"  (1.778 m)   Wt 173 lb (78.5 kg)   SpO2 96%   BMI 24.82 kg/m   General Appearance: No distress  Neuro:without focal findings,  speech normal,  HEENT: PERRLA, EOM intact.   Pulmonary: diffuse bilateral wheezing/crackles.  CardiovascularNormal S1,S2.  No m/r/g.   Abdomen: Benign, Soft, non-tender. Renal:  No costovertebral tenderness  GU:  No performed at this time. Endoc: No evident thyromegaly, no signs of acromegaly. Skin:   warm, no rashes, no ecchymosis  Extremities: normal, no cyanosis, clubbing.  Other findings:    LABORATORY PANEL:   CBC Recent Labs  Lab 06/22/17 0023  WBC 8.3  HGB 10.8*  HCT 31.9*  PLT 207   ------------------------------------------------------------------------------------------------------------------  Chemistries  Recent Labs  Lab 06/21/17 1045 06/22/17 0023  NA 133* 136  K 4.7 4.5  CL 100* 103  CO2 24 26  GLUCOSE 149* 187*  BUN 22* 21*  CREATININE 1.13 1.01  CALCIUM 8.1* 8.1*  AST 38  --   ALT 25  --   ALKPHOS 67  --   BILITOT 1.0  --    ------------------------------------------------------------------------------------------------------------------  Cardiac Enzymes Recent Labs  Lab 06/22/17 0704  TROPONINI 0.48*    ------------------------------------------------------------  RADIOLOGY:  Dg Chest 2 View  Result Date: 06/21/2017 CLINICAL DATA:  Weakness and shortness of breath. History of lung cancer. EXAM: CHEST - 2 VIEW COMPARISON:  Chest x-ray dated Jun 19, 2017. FINDINGS: Unchanged left chest wall AICD. Stable cardiomediastinal silhouette status post aortic valve replacement. Unchanged volume loss in the right hemithorax with rightward mediastinal shift. Chronic moderate right pleural effusion and underlying interstitial disease in the right lung, similar to prior study. Coarsened interstitial opacities in the left lung are also unchanged. No left pleural effusion. No pneumothorax. No acute osseous abnormality. IMPRESSION: 1. Stable chest. Unchanged moderate right pleural effusion, right hemithorax volume loss, and chronic interstitial lung disease. Electronically Signed   By: Huntley Dec  Derry M.D.   On: 06/21/2017 11:33       Thank  you for the consultation and for allowing [facility] to assist in the care of your patient. Our recommendations are noted above.  Please contact us if we can be of further service.   Marda Stalker, MD.  Board Certified in Internal Medicine, Pulmonary Medicine, [place], and Sleep Medicine.  [facility] Office Number: [phone]  Patricia Pesa, M.D.  Merton Border, M.D  06/22/2017

## 2017-06-22 NOTE — Care Management (Addendum)
Patient is currently followed by [facility] RN PT SLP Aide and outpatient palliative with [employer]. Agencies aware of admission.  Chronic home 02.

## 2017-06-22 NOTE — Progress Notes (Signed)
Please note, patient Is currently followed by out patient PALLIATIVE in his home. CMRN Joni Reining and hospital Palliative Team made aware. Flo Shanks RN, BSN, [place] [facility] [place], [facility] [phone]

## 2017-06-23 DIAGNOSIS — J90 Pleural effusion, not elsewhere classified: Secondary | ICD-10-CM

## 2017-06-23 DIAGNOSIS — F4321 Adjustment disorder with depressed mood: Secondary | ICD-10-CM

## 2017-06-23 DIAGNOSIS — Z7189 Other specified counseling: Secondary | ICD-10-CM

## 2017-06-23 DIAGNOSIS — Z515 Encounter for palliative care: Secondary | ICD-10-CM

## 2017-06-23 DIAGNOSIS — F4323 Adjustment disorder with mixed anxiety and depressed mood: Secondary | ICD-10-CM

## 2017-06-23 MED ORDER — IPRATROPIUM-ALBUTEROL 0.5-2.5 (3) MG/3ML IN SOLN: 3.0000 mL | RESPIRATORY_TRACT | Status: DC | PRN

## 2017-06-23 NOTE — Consult Note (Signed)
Consultation Note Date: 06/23/2017   Patient Name: Russell Howard  DOB: 1934/12/13  MRN: [id number]  Age / Sex: 83 y.o., male  PCP: Russell Crouch, MD Referring Physician: Fritzi Mandes, MD  Reason for Consultation: Establishing goals of care  HPI/Patient Profile: 83 y.o. 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 fib  admitted on 06/21/2017 with nausea, vomiting, shortness of breath and elevated troponins. He was admitted and is being treated for possible aspiration pneumonia. Most recent chest xray shows moderate R pleural effusion, R hemithorax volume loss, and chronic interstitial lung disease. Patient was seen outpatient by pulmonology days before admission and recommendations were made for Hospice. He is currently followed by [facility] Palliative outpatient. Palliative medicine consulted for further [place].   Clinical Assessment and Goals of Care:  I have reviewed medical records including EPIC notes, labs and imaging, assessed the patient and then met at the bedside along with the patient and his daughter in law- Russell Howard- to discuss diagnosis prognosis, GOC, EOL wishes, disposition and options.  I introduced Palliative Medicine as specialized medical care for people living with serious illness. It focuses on providing relief from the symptoms and stress of a serious illness. The goal is to improve quality of life for both the patient and the family.  We discussed a brief life review of the patient. He lives with Russell Howard and family. He was married and took great care of his disabled wife before she died in April 08, 2012. This has been a source of grief for him. He used to enjoy singing country and gospel, playing his guitar and fishing.   As far  as functional and nutritional status- he is able to go about town- shopping and to church in a wheelchair that is able to lie flat- he is more comfortable lying flat. He in unable to walk but only a few steps, but can transfer himself, and move himself around in the bed. He is requiring assistance with BM's due to his diarrhea. Russell Howard helps with his self caths- I observed her and she provides very good care.  His appetite is good. I am able to observe him eat a whole bagel with cream cheese and an omelet without any difficulty. They have home health services several days a week.     We discussed their current illness and what it means in the larger context of their on-going co-morbidities.  Natural disease trajectory and expectations at EOL were discussed. Russell Howard and Russell Howard both clearly describe to me Russell Howard's lung and heart problems. They described the trajectory of good days and bad days and understand that his bad days will begin to take over the good days. However, for now they feel he still has good quality of life and they are well supported at home with the home health care and Palliative team they have in place.  They are supported by outpatient [facility] Palliative services. Until Russell Howard's bad days outnumber his good days he wants to continue aggressive care and return to the hospital to receive limited medical care if needed- ie IV antibiotics. He would not want CPR or intubation or artificial feeding.   We discussed symptom management- he complains of SOB with ADL's, fatigue mostly in the mornings- I discussed possibly using low dose morphine for shortness of breath before ADL's, and methylphenidate in the mornings for increased energy- Russell Howard says he tried methylphenidate in the past with Russell Howard and they will discuss with him again.  He denies insomnia or poor appetite. I also screened him for depression related to chronic illness and grief. While he does get tearful when speaking of his Russell Howard's  passing- he denies other symptoms of depression. He declines antidepressant. They said they will discuss recommendations with Russell Howard- patient's primary MD.   Hospice and Palliative Care services outpatient were explained and offered. Russell Howard would like to continue with outpatient Palliative and the home health services that he has in place. He and Russell Howard understand that transitioning to Hospice is an option when they are ready.   Questions and concerns were addressed.    Primary Decision Maker PATIENT    SUMMARY OF RECOMMENDATIONS - ?IR consult for thoracentesis for pleural effusion- would pleurex cath help with chronic effusion -Pt to discuss symptom mgmt of chronic pulm fibrosis and CHF with Russell Howard- would recommend:   -Morphine concentrated liquid- 2.57m q2hr AS NEEDED for shortness of breath (can also premed before ADL's or any activity that is likely to make patient SOB)  -Methylphenidate 2.5955mpo 8am and 12pm (may titrate up to 55m655mor effect)  -Continue to monitor for depression symptoms      Code Status/Advance Care Planning:  DNR  Palliative Prophylaxis:   Frequent Pain Assessment  Prognosis:    Unable to determine  Discharge Planning: Home with Home Health  Primary Diagnoses: Present on Admission: . [place] respiratory failure ([place] I have reviewed the medical record, interviewed the patient and family, and examined the patient. The following aspects are pertinent.  Past Medical History:  Diagnosis Date  . Acquired hypothyroidism 11/02/2014  . BPH (benign prostatic hyperplasia)   . Cardiac defibrillator in place 11/02/2014  . [place]omy[place] ([place] . CHF (congestive heart failure) ([place] . Chronic diastolic heart failure ([place]/29/2016  . COPD (chronic obstructive pulmonary disease) ([facility] . Gastro-esophageal reflux disease without esophagitis 11/02/2014  . H/O malignant neoplasm 11/09/2014  . Heart valve disease 11/02/2014  . History of atrial  fibrillation 11/02/2014  . Lung cancer ([place] . Neuropathy 11/02/2014  . Orthostasis 11/02/2014  . Pure hypercholesterolemia 11/02/2014  . Valvular heart disease    Social History   Socioeconomic History  . Marital status: Widowed    Spouse name: Not on file  . Number of children: Not on file  . Years of education: Not on file  . Highest education level: Not on file  Occupational History  . Not on file  Social Needs  . Financial resource strain: Not on file  . Food insecurity:    Worry: Not on file    Inability: Not on file  . Transportation needs:    Medical: Not on file    Non-medical: Not on file  Tobacco Use  . Smoking status: Former Smoker    Types: Cigarettes    Last attempt to quit: 11/21/1981  Years since quitting: 35.6  . Smokeless tobacco: Never Used  Substance and Sexual Activity  . Alcohol use: No    Alcohol/week: 0.0 oz  . Drug use: No  . Sexual activity: Not Currently  Lifestyle  . Physical activity:    Days per week: Not on file    Minutes per session: Not on file  . Stress: Not on file  Relationships  . Social connections:    Talks on phone: Not on file    Gets together: Not on file    Attends religious service: Not on file    Active member of club or organization: Not on file    Attends meetings of clubs or organizations: Not on file    Relationship status: Not on file  Other Topics Concern  . Not on file  Social History Narrative   Live at home with family   Ambulates with a walker at baseline   Family History  Problem Relation Age of Onset  . Hypertension Unknown   . Heart disease Unknown   . CAD Father    Scheduled Meds: . acidophilus  1 capsule Oral QPM  . apixaban  5 mg Oral BID  . aspirin EC  81 mg Oral Daily  . budesonide  0.25 mg Nebulization BID  . Chlorhexidine Gluconate Cloth  6 each Topical Q0600  . cholecalciferol  5,000 Units Oral Daily  . dicyclomine  10 mg Oral TID  . digoxin  0.125 mg Oral Daily  . famotidine  20  mg Oral BID  . fluticasone  2 spray Each Nare Daily  . gabapentin  600 mg Oral BID  . guaiFENesin  600 mg Oral BID  . hydrocortisone  10 mg Oral BID  . ipratropium-albuterol  3 mL Nebulization QID  . levothyroxine  100 mcg Oral QAC breakfast  . magnesium oxide  400 mg Oral QPM  . mouth rinse  15 mL Mouth Rinse BID  . methenamine  1,000 mg Oral BID WC  . midodrine  10 mg Oral TID WC  . multivitamin with minerals  1 tablet Oral Daily  . mupirocin ointment  1 application Nasal BID  . pantoprazole  40 mg Oral QAC breakfast  . pyridostigmine  60 mg Oral BID  . simvastatin  20 mg Oral QPM  . sodium chloride flush  3 mL Intravenous Q12H  . vitamin B-12  500 mcg Oral Daily  . vitamin C  500 mg Oral BID  . vitamin E  400 Units Oral QPM   Continuous Infusions: . sodium chloride     PRN Meds:.sodium chloride, acetaminophen **OR** acetaminophen, guaiFENesin-dextromethorphan, HYDROcodone-acetaminophen, ondansetron, potassium chloride, sodium chloride flush Medications Prior to Admission:  Prior to Admission medications   Medication Sig Start Date End Date Taking? Authorizing Provider  acidophilus (RISAQUAD) CAPS capsule Take 1 capsule by mouth every evening.    Yes [provider]  apixaban (ELIQUIS) 5 MG TABS tablet 10 mg po bid for 3 days, then 5 mg po bid. 05/29/17  Yes Demetrios Loll, MD  Ascorbic Acid (VITAMIN C) 1000 MG tablet Take 500 mg by mouth 2 (two) times daily.    Yes [provider]  aspirin EC 81 MG tablet Take 81 mg by mouth daily.    Yes [provider]  budesonide (PULMICORT) 0.25 MG/2ML nebulizer solution Take 2 mLs (0.25 mg total) by nebulization 2 (two) times daily. 05/29/17  Yes Demetrios Loll, MD  Cholecalciferol 5000 units TABS Take 5,000 Units by mouth daily.  Yes [provider]  Coenzyme Q10 (CO Q-10) 120 MG CAPS Take 120 mg by mouth every evening.   Yes [provider]  dicyclomine (BENTYL) 10 MG capsule Take 1 capsule by mouth 3  (three) times daily. 06/19/17  Yes [provider]  digoxin (LANOXIN) 0.125 MG tablet Take 0.125 mg by mouth daily.   Yes [provider]  fluticasone (FLONASE) 50 MCG/ACT nasal spray Place 2 sprays into both nostrils daily. 05/29/17  Yes Demetrios Loll, MD  furosemide (LASIX) 20 MG tablet Take 20 mg by mouth every other day as needed for fluid.    Yes [provider]  gabapentin (NEURONTIN) 600 MG tablet Take 600 mg by mouth 2 (two) times daily.   Yes [provider]  hydrocortisone (CORTEF) 10 MG tablet Take 10 mg by mouth 2 (two) times daily.    Yes [provider]  ipratropium-albuterol (DUONEB) 0.5-2.5 (3) MG/3ML SOLN Take 3 mLs by nebulization every 4 (four) hours as needed. 05/29/17  Yes Demetrios Loll, MD  levothyroxine (SYNTHROID, LEVOTHROID) 100 MCG tablet Take 100 mcg by mouth daily before breakfast.   Yes [provider]  magnesium oxide (MAG-OX) 400 (241.3 Mg) MG tablet Take 400 mg by mouth every evening.   Yes [provider]  methenamine (MANDELAMINE) 1 g tablet Take 1,000 mg by mouth 2 (two) times daily with a meal.   Yes [provider]  midodrine (PROAMATINE) 10 MG tablet Take 1 tablet (10 mg total) by mouth 3 (three) times daily with meals. Patient taking differently: Take 10 mg by mouth 2 (two) times daily with a meal.  08/30/16  Yes Sudini, Alveta Heimlich, MD  Multiple Vitamin (MULTIVITAMIN WITH MINERALS) TABS tablet Take 1 tablet by mouth daily.   Yes [provider]  pantoprazole (PROTONIX) 40 MG tablet Take 40 mg by mouth daily before breakfast.    Yes [provider]  potassium chloride (K-DUR) 10 MEQ tablet Take 10 mEq by mouth daily as needed (for fluid retention when taking furosemide).   Yes [provider]  pyridostigmine (MESTINON) 60 MG tablet Take 60 mg by mouth 2 (two) times daily. 03/11/17  Yes [provider]  ranitidine (ZANTAC) 300 MG tablet Take 300 mg by mouth every evening.    Yes [provider]  saw palmetto 500 MG capsule Take 1,000 mg by mouth every evening.   Yes [provider]  simvastatin (ZOCOR) 20 MG tablet Take 20 mg by mouth every evening.    Yes [provider]  vitamin B-12 (CYANOCOBALAMIN) 500 MCG tablet Take 500 mcg by mouth daily.    Yes [provider]  vitamin E 400 UNIT capsule Take 400 Units by mouth every evening.    Yes [provider]  benzonatate (TESSALON) 200 MG capsule Take 1 capsule (200 mg total) by mouth 3 (three) times daily as needed for cough. Patient not taking: Reported on 06/21/2017 05/29/17   Demetrios Loll, MD  guaiFENesin-codeine [facility]) 100-10 MG/5ML syrup Take 5 mLs by mouth 3 (three) times daily as needed for cough.    [provider]  ondansetron (ZOFRAN ODT) 4 MG disintegrating tablet Take 1 tablet (4 mg total) by mouth every 8 (eight) hours as needed for nausea or vomiting. Patient not taking: Reported on 06/21/2017 04/29/17   Rudene Re, MD   Allergies  Allergen Reactions  . Penicillin G Hives    Has patient had a PCN reaction causing immediate rash, facial/tongue/throat swelling, SOB or lightheadedness  with hypotension: Yes Has patient had a PCN reaction causing severe rash involving mucus membranes or skin necrosis: No Has patient had a PCN reaction that required hospitalization No Has patient had a PCN reaction occurring within the last 10 years: No If all of the above answers are "NO", then may proceed with Cephalosporin use.  . Sulfa Antibiotics Rash   Review of Systems  Constitutional: Positive for activity change and fatigue.  Respiratory: Positive for shortness of breath.   Genitourinary: Positive for difficulty urinating.       Self caths  Psychiatric/Behavioral:       Grief over spousal loss    Physical Exam  Constitutional: He is oriented to person, place, and time. He appears well-developed and well-nourished.  Cardiovascular: Normal rate  and regular rhythm.  Pulmonary/Chest: Effort normal and breath sounds normal.  Abdominal: Soft. Bowel sounds are normal.  Musculoskeletal: He exhibits no edema.  Neurological: He is alert and oriented to person, place, and time.  Skin: Skin is warm and dry.  Psychiatric: He has a normal mood and affect. His behavior is normal. Judgment and thought content normal.  Nursing note and vitals reviewed.   Vital Signs: BP (!) 147/75 (BP Location: Right Arm)   Pulse 77   Temp 98.2 F (36.8 C) (Oral)   Resp 18   Ht _0  (1.778 m)   Wt 78.5 kg (173 lb)   SpO2 97%   BMI 24.82 kg/m  Pain Scale: 0-10   Pain Score: 0-No pain   SpO2: SpO2: 97 % O2 Device:SpO2: 97 % O2 Flow Rate: .O2 Flow Rate (L/min): 2 L/min  IO: Intake/output summary:   Intake/Output Summary (Last 24 hours) at 06/23/2017 1058 Last data filed at 06/23/2017 0810 Gross per 24 hour  Intake -  Output 2250 ml  Net -2250 ml    LBM: Last BM Date: 06/20/17 Baseline Weight: Weight: 81.2 kg (179 lb) Most recent weight: Weight: 78.5 kg (173 lb)     Palliative Assessment/Data: PPS: 50%     Thank you for this consult. Palliative medicine will continue to follow and assist as needed.   Time In:0930 Time Out: 1100 Time Total: 90 mins Prolonged time billed: Yes Greater than 50%  of this time was spent counseling and coordinating care related to the above assessment and plan.  Signed by: Mariana Kaufman, AGNP-C Palliative Medicine    Please contact Palliative Medicine Team phone at [phone] for questions and concerns.  For individual provider: See Shea Evans

## 2017-06-23 NOTE — Consult Note (Signed)
Reason for Consult: Shortness of breath congestion borderline troponins Referring Physician: Dr. Versie Starks hospitalist primary physician Georgie Chard  Russell Howard is an 84 y.o. male.  HPI: Patient presents with history of lung cancer right lung radiation with fibrosis chronic pulmonary embolus right-sided pleural effusion hypoxemia on 2 L O2 presented with generalized weakness nausea vomiting hypoxemia.  Patient says he is having symptoms of the last few days including diarrhea patient returns to the ER with vomiting and possible aspiration.  Denies any chest pain denies any significant palpitations or tachycardia.  Patient has known congestive heart failure symptoms AICD in place with generalized weakness because of lung cancer  Past Medical History:  Diagnosis Date  . Acquired hypothyroidism 11/02/2014  . BPH (benign prostatic hyperplasia)   . Cardiac defibrillator in place 11/02/2014  . Cardiomyopathy ([place])   . CHF (congestive heart failure) ([place])   . Chronic diastolic heart failure ([place]) 11/02/2014  . COPD (chronic obstructive pulmonary disease) ([place])   . Gastro-esophageal reflux disease without esophagitis 11/02/2014  . H/O malignant neoplasm 11/09/2014  . Heart valve disease 11/02/2014  . History of atrial fibrillation 11/02/2014  . Lung cancer ([place])   . Neuropathy 11/02/2014  . Orthostasis 11/02/2014  . Pure hypercholesterolemia 11/02/2014  . Valvular heart disease     Past Surgical History:  Procedure Laterality Date  . APPENDECTOMY    . CARDIAC VALVE REPLACEMENT     with a bovine valve  . DUAL ICD IMPLANT  2007/2009   implantable cardiac defibrillator  . PORT-A-CATH REMOVAL      Family History  Problem Relation Age of Onset  . Hypertension Unknown   . Heart disease Unknown   . CAD Father     Social History:  reports that he quit smoking about 35 years ago. His smoking use included cigarettes. He has never used smokeless tobacco. He reports that he does not drink alcohol or  use drugs.  Allergies:  Allergies  Allergen Reactions  . Penicillin G Hives    Has patient had a PCN reaction causing immediate rash, facial/tongue/throat swelling, SOB or lightheadedness with hypotension: Yes Has patient had a PCN reaction causing severe rash involving mucus membranes or skin necrosis: No Has patient had a PCN reaction that required hospitalization No Has patient had a PCN reaction occurring within the last 10 years: No If all of the above answers are "NO", then may proceed with Cephalosporin use.  . Sulfa Antibiotics Rash    Medications: I have reviewed the patient's current medications.  Results for orders placed or performed during the hospital encounter of 06/21/17 (from the past 48 hour(s))  Comprehensive metabolic panel     Status: Abnormal   Collection Time: 06/21/17 10:45 AM  Result Value Ref Range   Sodium 133 (L) 135 - 145 mmol/L    Comment: RESULTS VERIFIED BY REPEAT TESTING SNJ   Potassium 4.7 3.5 - 5.1 mmol/L   Chloride 100 (L) 101 - 111 mmol/L   CO2 24 22 - 32 mmol/L   Glucose, Bld 149 (H) 65 - 99 mg/dL   BUN 22 (H) 6 - 20 mg/dL   Creatinine, Ser 1.13 0.61 - 1.24 mg/dL   Calcium 8.1 (L) 8.9 - 10.3 mg/dL   Total Protein 7.1 6.5 - 8.1 g/dL   Albumin 3.1 (L) 3.5 - 5.0 g/dL   AST 38 15 - 41 U/L   ALT 25 17 - 63 U/L   Alkaline Phosphatase 67 38 - 126 U/L   Total Bilirubin  1.0 0.3 - 1.2 mg/dL   GFR calc non Af Amer 59 (L) >60 mL/min   GFR calc Af Amer >60 >60 mL/min    Comment: (NOTE) The eGFR has been calculated using the CKD EPI equation. This calculation has not been validated in all clinical situations. eGFR's persistently <60 mL/min signify possible Chronic Kidney Disease.    Anion gap 9 5 - 15    Comment: Performed at [facility], [address] [place], [place] [address]  Lactic acid, plasma     Status: Abnormal   Collection Time: 06/21/17 10:45 AM  Result Value Ref Range   Lactic Acid, Venous 2.3 (HH) 0.5 - 1.9 mmol/L     Comment: CRITICAL RESULT CALLED TO, READ BACK BY AND VERIFIED WITH BUTCH WOODS AT 1130 ON 06/21/17 BY SNJ Performed at [facility], [address] [place], [place] [address]   CBC with Differential     Status: Abnormal   Collection Time: 06/21/17 10:45 AM  Result Value Ref Range   WBC 9.0 3.8 - 10.6 K/uL   RBC 3.80 (L) 4.40 - 5.90 MIL/uL   Hemoglobin 11.8 (L) 13.0 - 18.0 g/dL   HCT 34.6 (L) 40.0 - 52.0 %   MCV 90.9 80.0 - 100.0 fL   MCH 31.1 26.0 - 34.0 pg   MCHC 34.2 32.0 - 36.0 g/dL   RDW 17.0 (H) 11.5 - 14.5 %   Platelets 222 150 - 440 K/uL   Neutrophils Relative % 83 %   Neutro Abs 7.4 (H) 1.4 - 6.5 K/uL   Lymphocytes Relative 7 %   Lymphs Abs 0.6 (L) 1.0 - 3.6 K/uL   Monocytes Relative 10 %   Monocytes Absolute 0.9 0.2 - 1.0 K/uL   Eosinophils Relative 0 %   Eosinophils Absolute 0.0 0 - 0.7 K/uL   Basophils Relative 0 %   Basophils Absolute 0.0 0 - 0.1 K/uL    Comment: Performed at [facility], [address] [place], [place] [address]  Protime-INR     Status: Abnormal   Collection Time: 06/21/17 10:45 AM  Result Value Ref Range   Prothrombin Time 17.9 (H) 11.4 - 15.2 seconds   INR 1.49     Comment: Performed at [facility], [address] [place], [place] [address]  Culture, blood (Routine x 2)     Status: None (Preliminary result)   Collection Time: 06/21/17 10:45 AM  Result Value Ref Range   Specimen Description BLOOD BLOOD RIGHT FOREARM    Special Requests      BOTTLES DRAWN AEROBIC AND ANAEROBIC Blood Culture adequate volume   Culture      NO GROWTH 2 DAYS Performed at [facility], [address] [place], [place] [address]    Report Status PENDING   Culture, blood (Routine x 2)     Status: None (Preliminary result)   Collection Time: 06/21/17 10:45 AM  Result Value Ref Range   Specimen Description BLOOD BLOOD RIGHT HAND    Special Requests      BOTTLES DRAWN AEROBIC AND ANAEROBIC Blood Culture adequate volume    Culture      NO GROWTH 2 DAYS Performed at [facility], [address], [place],  [address]    Report Status PENDING   Brain natriuretic peptide     Status: Abnormal   Collection Time: 06/21/17 10:45 AM  Result Value Ref Range   B Natriuretic Peptide 684.0 (H) 0.0 - 100.0 pg/mL  Comment: Performed at [facility], [address] [place], [place] [address]  Troponin I     Status: Abnormal   Collection Time: 06/21/17 11:38 AM  Result Value Ref Range   Troponin I 0.74 (HH) <0.03 ng/mL    Comment: CRITICAL RESULT CALLED TO, READ BACK BY AND VERIFIED WITH BUTCH WOODS AT 1227 ON 06/21/17 BY SNJ Performed at [facility], [address] [place], [place] [address]   Lactic acid, plasma     Status: None   Collection Time: 06/21/17  1:24 PM  Result Value Ref Range   Lactic Acid, Venous 1.3 0.5 - 1.9 mmol/L    Comment: Performed at [facility], [address] [place], [place] [address]  Procalcitonin - Baseline     Status: None   Collection Time: 06/21/17  1:24 PM  Result Value Ref Range   Procalcitonin 0.17 ng/mL    Comment:        Interpretation: PCT (Procalcitonin) <= 0.5 ng/mL: Systemic infection (sepsis) is not likely. Local bacterial infection is possible. (NOTE)       Sepsis PCT Algorithm           Lower Respiratory Tract                                      Infection PCT Algorithm    ----------------------------     ----------------------------         PCT < 0.25 ng/mL                PCT < 0.10 ng/mL         Strongly encourage             Strongly discourage   discontinuation of antibiotics    initiation of antibiotics    ----------------------------     -----------------------------       PCT 0.25 - 0.50 ng/mL            PCT 0.10 - 0.25 ng/mL               OR       >80% decrease in PCT            Discourage initiation of                                            antibiotics      Encourage discontinuation           of  antibiotics    ----------------------------     -----------------------------         PCT >= 0.50 ng/mL              PCT 0.26 - 0.50 ng/mL               AND        <80% decrease in PCT             Encourage initiation of                                             antibiotics       Encourage continuation  of antibiotics    ----------------------------     -----------------------------        PCT >= 0.50 ng/mL                  PCT > 0.50 ng/mL               AND         increase in PCT                  Strongly encourage                                      initiation of antibiotics    Strongly encourage escalation           of antibiotics                                     -----------------------------                                           PCT <= 0.25 ng/mL                                                 OR                                        > 80% decrease in PCT                                     Discontinue / Do not initiate                                             antibiotics Performed at [facility], [address] [place], [place] [address]   Urinalysis, Complete w Microscopic     Status: Abnormal   Collection Time: 06/21/17  3:15 PM  Result Value Ref Range   Color, Urine YELLOW (A) YELLOW   APPearance HAZY (A) CLEAR   Specific Gravity, Urine 1.015 1.005 - 1.030   pH 5.0 5.0 - 8.0   Glucose, UA NEGATIVE NEGATIVE mg/dL   Hgb urine dipstick NEGATIVE NEGATIVE   Bilirubin Urine NEGATIVE NEGATIVE   Ketones, ur NEGATIVE NEGATIVE mg/dL   Protein, ur 30 (A) NEGATIVE mg/dL   Nitrite NEGATIVE NEGATIVE   Leukocytes, UA SMALL (A) NEGATIVE   RBC / HPF 0-5 0 - 5 RBC/hpf   WBC, UA 21-50 0 - 5 WBC/hpf   Bacteria, UA NONE SEEN NONE SEEN   Squamous Epithelial / LPF 0-5 0 - 5   Mucus PRESENT    Hyaline Casts, UA PRESENT    Non Squamous Epithelial 0-5 (A) NONE SEEN    Comment: Performed at [facility], [address] [place], [place]  [address]  MRSA PCR Screening  Status: Abnormal   Collection Time: 06/21/17  3:15 PM  Result Value Ref Range   MRSA by PCR POSITIVE (A) NEGATIVE    Comment:        The GeneXpert MRSA Assay (FDA approved for NASAL specimens only), is one component of a comprehensive MRSA colonization surveillance program. It is not intended to diagnose MRSA infection nor to guide or monitor treatment for MRSA infections. RESULT CALLED TO, READ BACK BY AND VERIFIED WITH: [facility] 06/21/17 @ 1638  [place] Performed at [facility], [address] [place], [place] [address]   Troponin I     Status: Abnormal   Collection Time: 06/21/17  7:10 PM  Result Value Ref Range   Troponin I 0.62 (HH) <0.03 ng/mL    Comment: CRITICAL VALUE NOTED. VALUE IS CONSISTENT WITH PREVIOUSLY REPORTED/CALLED VALUE.PMH Performed at [facility], [address] [place], [place] [address]   Troponin I     Status: Abnormal   Collection Time: 06/22/17 12:23 AM  Result Value Ref Range   Troponin I 0.53 (HH) <0.03 ng/mL    Comment: CRITICAL VALUE NOTED. VALUE IS CONSISTENT WITH PREVIOUSLY REPORTED/CALLED VALUE.PMH Performed at [facility], [address] [place], [place] [address]   CBC     Status: Abnormal   Collection Time: 06/22/17 12:23 AM  Result Value Ref Range   WBC 8.3 3.8 - 10.6 K/uL   RBC 3.54 (L) 4.40 - 5.90 MIL/uL   Hemoglobin 10.8 (L) 13.0 - 18.0 g/dL   HCT 31.9 (L) 40.0 - 52.0 %   MCV 90.3 80.0 - 100.0 fL   MCH 30.7 26.0 - 34.0 pg   MCHC 34.0 32.0 - 36.0 g/dL   RDW 16.3 (H) 11.5 - 14.5 %   Platelets 207 150 - 440 K/uL    Comment: Performed at [facility], [address] [place], [place] [address]  Basic metabolic panel     Status: Abnormal   Collection Time: 06/22/17 12:23 AM  Result Value Ref Range   Sodium 136 135 - 145 mmol/L   Potassium 4.5 3.5 - 5.1 mmol/L   Chloride 103 101 - 111 mmol/L   CO2 26 22 - 32 mmol/L   Glucose, Bld 187 (H) 65 - 99 mg/dL    BUN 21 (H) 6 - 20 mg/dL   Creatinine, Ser 1.01 0.61 - 1.24 mg/dL   Calcium 8.1 (L) 8.9 - 10.3 mg/dL   GFR calc non Af Amer >60 >60 mL/min   GFR calc Af Amer >60 >60 mL/min    Comment: (NOTE) The eGFR has been calculated using the CKD EPI equation. This calculation has not been validated in all clinical situations. eGFR's persistently <60 mL/min signify possible Chronic Kidney Disease.    Anion gap 7 5 - 15    Comment: Performed at [facility], [address] [place], [place] [address]  Troponin I     Status: Abnormal   Collection Time: 06/22/17  7:04 AM  Result Value Ref Range   Troponin I 0.48 (HH) <0.03 ng/mL    Comment: CRITICAL VALUE NOTED. VALUE IS CONSISTENT WITH PREVIOUSLY REPORTED/CALLED VALUE DAS Performed at [facility], [address] [place], [place] [address]     Dg Chest 2 View  Result Date: 06/21/2017 CLINICAL DATA:  Weakness and shortness of breath. History of lung cancer. EXAM: CHEST - 2 VIEW COMPARISON:  Chest x-ray dated Jun 19, 2017. FINDINGS: Unchanged left chest wall AICD. Stable cardiomediastinal silhouette status post  aortic valve replacement. Unchanged volume loss in the right hemithorax with rightward mediastinal shift. Chronic moderate right pleural effusion and underlying interstitial disease in the right lung, similar to prior study. Coarsened interstitial opacities in the left lung are also unchanged. No left pleural effusion. No pneumothorax. No acute osseous abnormality. IMPRESSION: 1. Stable chest. Unchanged moderate right pleural effusion, right hemithorax volume loss, and chronic interstitial lung disease. Electronically Signed   By: Titus Dubin M.D.   On: 06/21/2017 11:33    Review of Systems  Constitutional: Positive for diaphoresis, malaise/fatigue and weight loss.  HENT: Positive for congestion.   Eyes: Negative.   Respiratory: Positive for cough, sputum production and shortness of breath.   Cardiovascular: Positive  for orthopnea, leg swelling and PND.  Gastrointestinal: Negative.   Genitourinary: Negative.   Musculoskeletal: Negative.   Skin: Negative.   Neurological: Positive for dizziness and weakness.  Endo/Heme/Allergies: Negative.   Psychiatric/Behavioral: Negative.    Blood pressure (!) 147/80, pulse 92, temperature 97.8 F (36.6 C), temperature source Oral, resp. rate 18, height 5' 10" (1.778 m), weight 173 lb (78.5 kg), SpO2 91 %. Physical Exam  Nursing note and vitals reviewed. Constitutional: He is oriented to person, place, and time. He appears well-developed and well-nourished.  HENT:  Head: Normocephalic and atraumatic.  Eyes: Pupils are equal, round, and reactive to light. Conjunctivae and EOM are normal.  Neck: Normal range of motion. Neck supple.  Cardiovascular: Normal rate and regular rhythm.  Murmur heard. Respiratory: Tachypnea noted. He has decreased breath sounds. He has wheezes. He has rhonchi.  GI: Soft. Bowel sounds are normal.  Musculoskeletal: Normal range of motion.  Neurological: He is alert and oriented to person, place, and time. He has normal reflexes.  Skin: Skin is warm and dry.  Psychiatric: He has a normal mood and affect.    Assessment/Plan: Shortness of breath Congestion Weakness Borderline troponins Paroxysmal A. Fib Acute on chronic restaurant failure GERD Chronic pulmonary emboli BPH Chronic hypotension Congestive heart failure systolic and diastolic dysfunction AICD in place Pulmonary fibrosis shortness of breath Lung cancer with pleural effusion . Plan Agree to admit to telemetry Follow-up EKGs and troponins Recommend supplemental oxygen as necessary Continue inhalers to help with shortness of breath Recommend pulmonary input for severe COPD Consider thoracentesis for pleural effusion on the right Agree with digoxin and Eliquis for atrial fibrillation anticoagulation Maintain Eliquis for chronic PEs Continue therapy for GERD type  symptoms Hyperlipidemia continue simvastatin therapy for lipid management Consider defibrillator interrogation and evaluation Diuretic therapy for shortness of breath  Russell Howard 06/23/2017, 7:56 AM

## 2017-06-23 NOTE — Progress Notes (Signed)
[place] [facility] [place] NAME: Russell Howard    MR#:  [id number]  DATE OF BIRTH:  02/24/1934  SUBJECTIVE:  Feels weak No new issues for RN. Resting comfortably. On 2 L nasal cannula oxygen. No respiratory distress. dter in law in the room REVIEW OF SYSTEMS:   Review of Systems  Constitutional: Negative for chills, fever and weight loss.  HENT: Negative for ear discharge, ear pain and nosebleeds.   Eyes: Negative for blurred vision, pain and discharge.  Respiratory: Positive for shortness of breath. Negative for sputum production, wheezing and stridor.   Cardiovascular: Negative for chest pain, palpitations, orthopnea and PND.  Gastrointestinal: Negative for abdominal pain, diarrhea, nausea and vomiting.  Genitourinary: Negative for frequency and urgency.  Musculoskeletal: Negative for back pain and joint pain.  Neurological: Positive for weakness. Negative for sensory change, speech change and focal weakness.  Psychiatric/Behavioral: Negative for depression and hallucinations. The patient is not nervous/anxious.   :   DRUG ALLERGIES:   Allergies  Allergen Reactions  . Penicillin G Hives    Has patient had a PCN reaction causing immediate rash, facial/tongue/throat swelling, SOB or lightheadedness with hypotension: Yes Has patient had a PCN reaction causing severe rash involving mucus membranes or skin necrosis: No Has patient had a PCN reaction that required hospitalization No Has patient had a PCN reaction occurring within the last 10 years: No If all of the above answers are "NO", then may proceed with Cephalosporin use.  . Sulfa Antibiotics Rash    VITALS:  Blood pressure 131/78, pulse (!) 114, temperature 98.2 F (36.8 C), temperature source Oral, resp. rate 18, Howard 5' 10" (1.778 m), weight 78.5 kg (173 lb), SpO2 96 %.  PHYSICAL EXAMINATION:   Physical Exam  GENERAL:  83 y.o.-year-old patient lying in the bed with no  acute distress. Appears chronically ill week EYES: Pupils equal, round, reactive to light and accommodation. No scleral icterus. Extraocular muscles intact.  HEENT: Head atraumatic, normocephalic. Oropharynx and nasopharynx clear.  NECK:  Supple, no jugular venous distention. No thyroid enlargement, no tenderness.  LUNGS: coarse breath sounds bilaterally, no wheezing, rales, rhonchi. No use of accessory muscles of respiration.  CARDIOVASCULAR: S1, S2 normal. No murmurs, rubs, or gallops.  ABDOMEN: Soft, nontender, nondistended. Bowel sounds present. No organomegaly or mass.  EXTREMITIES: No cyanosis, clubbing or edema b/l.    NEUROLOGIC: grossly nonfocal no focal Motor or sensory deficits b/l.   PSYCHIATRIC:  patient is alert SKIN: No obvious rash, lesion, or ulcer.   LABORATORY PANEL:  CBC Recent Labs  Lab 06/22/17 0023  WBC 8.3  HGB 10.8*  HCT 31.9*  PLT 207    Chemistries  Recent Labs  Lab 06/21/17 1045 06/22/17 0023  NA 133* 136  K 4.7 4.5  CL 100* 103  CO2 24 26  GLUCOSE 149* 187*  BUN 22* 21*  CREATININE 1.13 1.01  CALCIUM 8.1* 8.1*  AST 38  --   ALT 25  --   ALKPHOS 67  --   BILITOT 1.0  --    Cardiac Enzymes Recent Labs  Lab 06/22/17 0704  TROPONINI 0.48*   RADIOLOGY:  No results found. ASSESSMENT AND PLAN:   Russell Howard  is a 84 y.o. male with a known history of lung cancer in the past with extensive right lung radiation fibrosis, chronic pulmonary embolism, right-sided pleural effusion on chronic oxygen therapy with 2 L presents to the ED with generalized weakness vomiting and  worsening hypoxia.  Patient was seen in the ER 2 days ago  1.  Acute on chronic respiratory failure likely due to combination of COPD exaceration and possible aspiration pneumonitis I-procalcitonin level 0.17, WBC normal--d/c abxs  -sepsis ruled out Nebulizer therapy prn  appreciate pulmonary input from Dr. Ashby Dawes . Continue Pulmicort  2.  Nausea vomiting diarrhea if  symptoms persist consider doing CT scan of the abdomen currently diarrhea is resolved  3.  Elevated troponin:  appears to be demand ischemia in the setting of his acute respiratory failure and chronic pulmonary embolism however his troponin has been negative in the past Continue aspirin therapy  No cp  4.  Chronic pulmonary embolism  continue Eliquis  5.  Paroxysmal atrial fibrillation  continue digoxin  6.  GERD continue PPI  7.  BPH with urinary retention continue in and out cath is doing before  8.  Chronic hypotension continue hydrocortisone and hydrating  palliative consultation appreciated. Met with pt and dter in law. Pt will return back home with [facility] services tomorrow if shows improvement.  Case discussed with Care Management/Social Worker. Management plans discussed with the patient, family and they are in agreement.  CODE STATUS: DNR  DVT Prophylaxis: eliquis  TOTAL TIME TAKING CARE OF THIS PATIENT: *25* minutes.  >50% time spent on counselling and coordination of care  POSSIBLE D/C IN *1-2* DAYS, DEPENDING ON CLINICAL CONDITION.  Note: This dictation was prepared with Dragon dictation along with smaller phrase technology. Any transcriptional errors that result from this process are unintentional.  Fritzi Mandes M.D on 06/23/2017 at 3:30 PM  Between 7am to 6pm - Pager - [phone]  After 6pm go to [email or web address] - password [email or web address] [facility]  Office  [phone]  CC: Primary care physician; [name], MDPatient ID: Russell Howard, male   DOB: 1934/04/05, 83 y.o.   MRN: [id number]

## 2017-06-23 NOTE — Plan of Care (Signed)
  Problem: Education: Goal: Knowledge of General Education information will improve Outcome: Progressing   Problem: Nutrition: Goal: Adequate nutrition will be maintained Outcome: Progressing   Problem: Coping: Goal: Level of anxiety will decrease Outcome: Progressing

## 2017-06-23 NOTE — Evaluation (Signed)
Physical Therapy Evaluation Patient Details Name: Russell Howard MRN: [id number] DOB: 1935-01-09 Today's Date: 06/23/2017   History of Present Illness  83 y.o. malewith a history of cardiomyopathy with EF of 45%, COPD on 2 L home oxygen, remote history of smoking, paroxysmal A. fib, hyperlipidemia, valvular heart disease, benign prostatic hypertrophy requiring self-catheterization twice a day.  Assessment includes: SOB with chronic PE, acute cystitis, A-fib, L-sided chest pain with elevated troponin due to demand ischemia, and chronic hypotension.    Clinical Impression  Pt did well with most aspects of mobility apart from significant DOE with even very minimal exertion.  He had good strength, balance, mobility and ability to take steps with walker but with each activity needed focused breathing, rest breaks and ultimately needed to quickly sit and lay down with just a few feet of ambulation in room.  Pt's O2 dropped from mid 90s (on 2 liters at rest) to low 80s (on 3 liters with activity) and did return to the mid/low 90s relatively quickly after getting back to sidelying but with feeling of severe weakness and fatigue. Pt has 24/7 assist at home and is safe for very limited in-home distances, will benefit from HHPT upon discharge.    Follow Up Recommendations Home health PT    Equipment Recommendations  None recommended by PT    Recommendations for Other Services       Precautions / Restrictions Precautions Precautions: Fall Restrictions Weight Bearing Restrictions: No      Mobility  Bed Mobility Overal bed mobility: Modified Independent             General bed mobility comments: Use of rails and some struggle, but able to get to and maintain sitting w/o direct assist  Transfers Overall transfer level: Modified independent Equipment used: Rolling walker (2 wheeled)             General transfer comment: Pt able to get to standing w/o direct assist and showed good  confidence and safety.  Some SOB with the effort.   Ambulation/Gait Ambulation/Gait assistance: Min guard Ambulation Distance (Feet): 10 Feet Assistive device: Rolling walker (2 wheeled)       General Gait Details: Pt able to do side stepping along EOB as well as forward/backward ambulation with walker and increased O2 (from 2 to 3 liters with activity) but quickly fatigued with limited mobility and needed to quickly sit and get to laying down 2/2 SOB  Stairs            Wheelchair Mobility    Modified Rankin (Stroke Patients Only)       Balance Overall balance assessment: Modified Independent                                           Pertinent Vitals/Pain Pain Assessment: No/denies pain    Home Living Family/patient expects to be discharged to:: Private residence Living Arrangements: Other relatives(daughter-in-law) Available Help at Discharge: Family;Available 24 hours/day Type of Home: House Home Access: Ramped entrance     Home Layout: Two level;Able to live on main level with bedroom/bathroom Home Equipment: Wheelchair - [employer] - 4 wheels;Shower seat;Toilet riser      Prior Function Level of Independence: Needs assistance   Gait / Transfers Assistance Needed: Limited household ambulator with rollator with SBA from family for safety, no fall history  ADL's / Homemaking Assistance Needed: Requires assist for bathing  and dressing from daughter in law, daughter in law provides set up for self-cath (pt does 2x/day) and manages medications, meals, cleaning  Comments: Pt chronically feels very fatigued/short of breath with minimal exertion      Hand Dominance        Extremity/Trunk Assessment   Upper Extremity Assessment Upper Extremity Assessment: Overall WFL for tasks assessed;Generalized weakness(fatigued with even minimal light testing)    Lower Extremity Assessment Lower Extremity Assessment: Overall WFL for tasks  assessed;Generalized weakness(fatigued with minimal light testing)       Communication   Communication: No difficulties(some dyspnea with "prolonged" speaking)  Cognition Arousal/Alertness: Awake/alert Behavior During Therapy: Restless(secondary to SOB) Overall Cognitive Status: Within Functional Limits for tasks assessed                                        General Comments      Exercises     Assessment/Plan    PT Assessment Patient needs continued PT services  PT Problem List Decreased strength;Decreased range of motion;Decreased activity tolerance;Decreased mobility;Decreased safety awareness;Decreased knowledge of use of DME;Cardiopulmonary status limiting activity       PT Treatment Interventions DME instruction;Gait training;Functional mobility training;Therapeutic activities;Therapeutic exercise;Balance training;Neuromuscular re-education;Patient/family education    PT Goals (Current goals can be found in the Care Plan section)  Acute Rehab PT Goals Patient Stated Goal: get breathing better to be able to do more around the home PT Goal Formulation: With patient Time For Goal Achievement: 07/07/17 Potential to Achieve Goals: Fair    Frequency Min 2X/week   Barriers to discharge        Co-evaluation               AM-PAC PT "6 Clicks" Daily Activity  Outcome Measure Difficulty turning over in bed (including adjusting bedclothes, sheets and blankets)?: A Little Difficulty moving from lying on back to sitting on the side of the bed? : A Little Difficulty sitting down on and standing up from a chair with arms (e.g., wheelchair, bedside commode, etc,.)?: A Little Help needed moving to and from a bed to chair (including a wheelchair)?: None Help needed walking in hospital room?: A Little Help needed climbing 3-5 steps with a railing? : A Lot 6 Click Score: 18    End of Session Equipment Utilized During Treatment: Gait belt Activity  Tolerance: Patient limited by fatigue Patient left: with call bell/phone within reach;with bed alarm set Nurse Communication: Mobility status PT Visit Diagnosis: Muscle weakness (generalized) (M62.81);Difficulty in walking, not elsewhere classified (R26.2)    Time: 5456-2563 PT Time Calculation (min) (ACUTE ONLY): 23 min   Charges:   PT Evaluation $PT Eval Moderate Complexity: 1 Mod     PT G CodesKreg Shropshire, DPT 06/23/2017, 4:07 PM

## 2017-06-24 MED ORDER — MIDODRINE HCL 10 MG PO TABS: 10.0000 mg | ORAL_TABLET | Freq: Three times a day (TID) | ORAL | 0 refills | Status: AC | PRN

## 2017-06-24 NOTE — Discharge Summary (Signed)
[facility] [place] NAME: Russell Howard    MR#:  [id number]  DATE OF BIRTH:  [birth year]  DATE OF ADMISSION:  06/21/2017 ADMITTING PHYSICIAN: Dustin Flock, MD  DATE OF DISCHARGE: 06/24/2017  PRIMARY CARE PHYSICIAN: [name], MD    ADMISSION DIAGNOSIS:  Hypoxia [R09.02] Sepsis, due to unspecified organism ([place]) [A41.9]  DISCHARGE DIAGNOSIS:  Acute on chronic respiratory distress secondary to suspected chemical pneumonitis respiratory failure on 2 L nasal cannula oxygen history of pulmonary embolism  SECONDARY DIAGNOSIS:   Past Medical History:  Diagnosis Date  . Acquired hypothyroidism 11/02/2014  . BPH (benign prostatic hyperplasia)   . Cardiac defibrillator in place 11/02/2014  . Cardiomyopathy ([place])   . CHF (congestive heart failure) ([place])   . Chronic diastolic heart failure ([place]) 11/02/2014  . COPD (chronic obstructive pulmonary disease) ([place])   . Gastro-esophageal reflux disease without esophagitis 11/02/2014  . H/O malignant neoplasm 11/09/2014  . Heart valve disease 11/02/2014  . History of atrial fibrillation 11/02/2014  . Lung cancer ([place])   . Neuropathy 11/02/2014  . Orthostasis 11/02/2014  . Pure hypercholesterolemia 11/02/2014  . Valvular heart disease     HOSPITAL COURSE:   HarlDunkinis an 83 y.o.malewith a known history of lung cancer in the past with extensive right lung radiation fibrosis, chronic pulmonary embolism, right-sided pleural effusion on chronic oxygen therapy with 2 L presents to the ED with generalized weakness vomiting and worsening hypoxia. Patient was seen in the ER 2 days ago  1.Acute on chronic respiratory failure likely due to combination of COPD exaceration and possible aspiration pneumonitis I-procalcitonin level 0.17, WBC normal--d/c abxs  -sepsis ruled out Nebulizer therapy prn  appreciate pulmonary input from Dr. Ashby Dawes . Continue Pulmicort  2.Nausea vomiting  diarrhea resolved  3.Elevated troponin: appears to be demand ischemia in the setting of his acute respiratory failure and chronic pulmonary embolism however his troponin has been negative in the past Continue aspirin therapy  No cp  4.Chronic pulmonary embolism  continue Eliquis  5.Paroxysmal atrial fibrillation  continue digoxin  6.GERD continue PPI  7.BPH with urinary retention continue in and out cath is doing before  8.Chronic hypotension discussed with patient's daughter in law Ms. [name]. Patient's blood pressure according to her has been in the 90s and hundred. Here in the hospital the lowest was 333 systolic. Blood pressure has been in fact sometimes on the higher side. Will keep Midodrine as PRN medication with blood pressure parameters given to daughter-in-law to manage at home. She is agreeable with it.  palliative consultation appreciated.  Pt will return back home with [facility] services today.  CONSULTS OBTAINED:  Treatment Team:  Wilhelmina Mcardle, MD Yolonda Kida, MD [facility], [facility], MD  DRUG ALLERGIES:   Allergies  Allergen Reactions  . Penicillin G Hives    Has patient had a PCN reaction causing immediate rash, facial/tongue/throat swelling, SOB or lightheadedness with hypotension: Yes Has patient had a PCN reaction causing severe rash involving mucus membranes or skin necrosis: No Has patient had a PCN reaction that required hospitalization No Has patient had a PCN reaction occurring within the last 10 years: No If all of the above answers are "NO", then may proceed with Cephalosporin use.  . Sulfa Antibiotics Rash    DISCHARGE MEDICATIONS:   Allergies as of 06/24/2017      Reactions   Penicillin G Hives   Has patient had a PCN reaction causing immediate rash, facial/tongue/throat swelling, SOB  or lightheadedness with hypotension: Yes Has patient had a PCN reaction causing severe rash involving mucus membranes or skin necrosis:  No Has patient had a PCN reaction that required hospitalization No Has patient had a PCN reaction occurring within the last 10 years: No If all of the above answers are "NO", then may proceed with Cephalosporin use.   Sulfa Antibiotics Rash      Medication List    STOP taking these medications   benzonatate 200 MG capsule Commonly known as:  TESSALON   ciprofloxacin 500 MG tablet Commonly known as:  CIPRO   ondansetron 4 MG disintegrating tablet Commonly known as:  ZOFRAN ODT     TAKE these medications   acidophilus Caps capsule Take 1 capsule by mouth every evening.   apixaban 5 MG Tabs tablet Commonly known as:  ELIQUIS 10 mg po bid for 3 days, then 5 mg po bid.   aspirin EC 81 MG tablet Take 81 mg by mouth daily.   budesonide 0.25 MG/2ML nebulizer solution Commonly known as:  PULMICORT Take 2 mLs (0.25 mg total) by nebulization 2 (two) times daily.   Cholecalciferol 5000 units Tabs Take 5,000 Units by mouth daily.   Co Q-10 120 MG Caps Take 120 mg by mouth every evening.   dicyclomine 10 MG capsule Commonly known as:  BENTYL Take 1 capsule by mouth 3 (three) times daily.   digoxin 0.125 MG tablet Commonly known as:  LANOXIN Take 0.125 mg by mouth daily.   fluticasone 50 MCG/ACT nasal spray Commonly known as:  FLONASE Place 2 sprays into both nostrils daily.   furosemide 20 MG tablet Commonly known as:  LASIX Take 20 mg by mouth every other day as needed for fluid.   gabapentin 600 MG tablet Commonly known as:  NEURONTIN Take 600 mg by mouth 2 (two) times daily.   guaiFENesin-codeine 100-10 MG/5ML syrup Commonly known as:  ROBITUSSIN AC Take 5 mLs by mouth 3 (three) times daily as needed for cough.   hydrocortisone 10 MG tablet Commonly known as:  CORTEF Take 10 mg by mouth 2 (two) times daily.   ipratropium-albuterol 0.5-2.5 (3) MG/3ML Soln Commonly known as:  DUONEB Take 3 mLs by nebulization every 4 (four) hours as needed.   levothyroxine  100 MCG tablet Commonly known as:  SYNTHROID, LEVOTHROID Take 100 mcg by mouth daily before breakfast.   magnesium oxide 400 (241.3 Mg) MG tablet Commonly known as:  MAG-OX Take 400 mg by mouth every evening.   methenamine 1 g tablet Commonly known as:  MANDELAMINE Take 1,000 mg by mouth 2 (two) times daily with a meal.   midodrine 10 MG tablet Commonly known as:  PROAMATINE Take 1 tablet (10 mg total) by mouth 3 (three) times daily as needed. If systolic BP <542 What changed:    when to take this  reasons to take this  additional instructions   multivitamin with minerals Tabs tablet Take 1 tablet by mouth daily.   pantoprazole 40 MG tablet Commonly known as:  PROTONIX Take 40 mg by mouth daily before breakfast.   potassium chloride 10 MEQ tablet Commonly known as:  K-DUR Take 10 mEq by mouth daily as needed (for fluid retention when taking furosemide).   pyridostigmine 60 MG tablet Commonly known as:  MESTINON Take 60 mg by mouth 2 (two) times daily.   ranitidine 300 MG tablet Commonly known as:  ZANTAC Take 300 mg by mouth every evening.   saw palmetto 500 MG capsule  Take 1,000 mg by mouth every evening.   simvastatin 20 MG tablet Commonly known as:  ZOCOR Take 20 mg by mouth every evening.   vitamin B-12 500 MCG tablet Commonly known as:  CYANOCOBALAMIN Take 500 mcg by mouth daily.   vitamin C 1000 MG tablet Take 500 mg by mouth 2 (two) times daily.   vitamin E 400 UNIT capsule Take 400 Units by mouth every evening.       If you experience worsening of your admission symptoms, develop shortness of breath, life threatening emergency, suicidal or homicidal thoughts you must seek medical attention immediately by calling 911 or calling your MD immediately  if symptoms less severe.  You Must read complete instructions/literature along with all the possible adverse reactions/side effects for all the Medicines you take and that have been prescribed to you.  Take any new Medicines after you have completely understood and accept all the possible adverse reactions/side effects.   Please note  You were cared for by a hospitalist during your hospital stay. If you have any questions about your discharge medications or the care you received while you were in the hospital after you are discharged, you can call the unit and asked to speak with the hospitalist on call if the hospitalist that took care of you is not available. Once you are discharged, your primary care physician will handle any further medical issues. Please note that NO REFILLS for any discharge medications will be authorized once you are discharged, as it is imperative that you return to your primary care physician (or establish a relationship with a primary care physician if you do not have one) for your aftercare needs so that they can reassess your need for medications and monitor your lab values. Today   SUBJECTIVE  No new complaints  VITAL SIGNS:  Blood pressure 128/67, pulse 89, temperature 98.1 F (36.7 C), temperature source Oral, resp. rate 18, height 5\' 10"  (1.778 m), weight 78.5 kg (173 lb), SpO2 97 %.  I/O:    Intake/Output Summary (Last 24 hours) at 06/24/2017 1030 Last data filed at 06/24/2017 9562 Gross per 24 hour  Intake 240 ml  Output 2300 ml  Net -2060 ml    PHYSICAL EXAMINATION:  GENERAL:  83 y.o.-year-old patient lying in the bed with no acute distress.  EYES: Pupils equal, round, reactive to light and accommodation. No scleral icterus. Extraocular muscles intact.  HEENT: Head atraumatic, normocephalic. Oropharynx and nasopharynx clear.  NECK:  Supple, no jugular venous distention. No thyroid enlargement, no tenderness.  LUNGS: decreased breath sounds bilaterally, no wheezing, rales,rhonchi or crepitation. No use of accessory muscles of respiration.  CARDIOVASCULAR: S1, S2 normal. No murmurs, rubs, or gallops.  ABDOMEN: Soft, non-tender, non-distended. Bowel  sounds present. No organomegaly or mass.  EXTREMITIES: No pedal edema, cyanosis, or clubbing.  NEUROLOGIC: Cranial nerves II through XII are intact. Muscle strength 5/5 in all extremities. Sensation intact. Gait not checked.  PSYCHIATRIC: The patient is alert and oriented x 3.  SKIN: No obvious rash, lesion, or ulcer.   DATA REVIEW:   CBC  Recent Labs  Lab 06/22/17 0023  WBC 8.3  HGB 10.8*  HCT 31.9*  PLT 207    Chemistries  Recent Labs  Lab 06/21/17 1045 06/22/17 0023  NA 133* 136  K 4.7 4.5  CL 100* 103  CO2 24 26  GLUCOSE 149* 187*  BUN 22* 21*  CREATININE 1.13 1.01  CALCIUM 8.1* 8.1*  AST 38  --  ALT 25  --   ALKPHOS 67  --   BILITOT 1.0  --     Microbiology Results   Recent Results (from the past 240 hour(s))  Culture, blood (Routine x 2)     Status: None (Preliminary result)   Collection Time: 06/21/17 10:45 AM  Result Value Ref Range Status   Specimen Description BLOOD BLOOD RIGHT FOREARM  Final   Special Requests   Final    BOTTLES DRAWN AEROBIC AND ANAEROBIC Blood Culture adequate volume   Culture   Final    NO GROWTH 3 DAYS Performed at [facility], [address], [place], [place] [address]    Report Status PENDING  Incomplete  Culture, blood (Routine x 2)     Status: None (Preliminary result)   Collection Time: 06/21/17 10:45 AM  Result Value Ref Range Status   Specimen Description BLOOD BLOOD RIGHT HAND  Final   Special Requests   Final    BOTTLES DRAWN AEROBIC AND ANAEROBIC Blood Culture adequate volume   Culture   Final    NO GROWTH 3 DAYS Performed at [facility], [address] [place], [place] [address]    Report Status PENDING  Incomplete  MRSA PCR Screening     Status: Abnormal   Collection Time: 06/21/17  3:15 PM  Result Value Ref Range Status   MRSA by PCR POSITIVE (A) NEGATIVE Final    Comment:        The GeneXpert MRSA Assay (FDA approved for NASAL specimens only), is one component of a comprehensive  MRSA colonization surveillance program. It is not intended to diagnose MRSA infection nor to guide or monitor treatment for MRSA infections. RESULT CALLED TO, READ BACK BY AND VERIFIED WITH: CRYSTAL [facility] 06/21/17 @ 1638  [place] Performed at [facility], [address] [place], [place] [address]     RADIOLOGY:  No results found.   Management plans discussed with the patient, family and they are in agreement.  CODE STATUS:     Code Status Orders  (From admission, onward)        Start     Ordered   06/21/17 1421  Do not attempt resuscitation (DNR)  Continuous    Question Answer Comment  In the event of cardiac or respiratory ARREST Do not call a "code blue"   In the event of cardiac or respiratory ARREST Do not perform Intubation, CPR, defibrillation or ACLS   In the event of cardiac or respiratory ARREST Use medication by any route, position, wound care, and other measures to relive pain and suffering. May use oxygen, suction and manual treatment of airway obstruction as needed for comfort.      06/21/17 1420    Code Status History    Date Active Date Inactive Code Status Order ID Comments User Context   05/22/2017 1609 05/29/2017 1740 DNR [id number]  Gladstone Lighter, MD Inpatient   11/17/2016 1631 11/20/2016 1602 DNR [id number]  Vaughan Basta, MD Inpatient   08/19/2016 1315 08/30/2016 1728 DNR [id number]  Harrie Foreman, MD Inpatient   03/02/2016 2236 03/05/2016 2020 DNR [id number]  Mikael Spray, NP ED   01/09/2016 0658 01/14/2016 1939 DNR [id number]  Harrie Foreman, MD Inpatient   11/09/2015 0318 11/12/2015 1713 DNR [id number]  Harrie Foreman, MD Inpatient   06/14/2015 1639 06/18/2015 1513 DNR [id number]  Demetrios Loll, MD Inpatient    Advance Directive Documentation     Most Recent Value  Type of Advance Directive  Healthcare Power of Attorney  Pre-existing out of facility DNR order (yellow form or pink MOST form)  -  "MOST" Form in Place?  -       TOTAL TIME TAKING CARE OF THIS PATIENT: *40* minutes.    Fritzi Mandes M.D on 06/24/2017 at 10:30 AM  Between 7am to 6pm - Pager - [phone] After 6pm go to [email or web address] - password [email or web address] [place]  Office  [phone]  CC: Primary care physician; [name], MD

## 2017-06-24 NOTE — Care Management Important Message (Signed)
Important Message  Patient Details  Name: Russell Howard MRN: [id number] Date of Birth: Nov 12, 1934   Medicare Important Message Given:  No   CM covering  2 units. CM was aware of discharge but not aware the notice was not given due to patient being on isolation.  By the time CM got back to that unit and found the notice, patient had discharged.  Katrina Stack, RN 06/24/2017, 1:16 PM

## 2017-06-24 NOTE — Progress Notes (Addendum)
Maverik Quant to be D/C'd Home with home health per MD order.  Discussed prescriptions and follow up appointments with the patient. Prescriptions sent [facility] on [address], medication list explained in detail. Pt verbalized understanding.  Allergies as of 06/24/2017      Reactions   Penicillin G Hives   Has patient had a PCN reaction causing immediate rash, facial/tongue/throat swelling, SOB or lightheadedness with hypotension: Yes Has patient had a PCN reaction causing severe rash involving mucus membranes or skin necrosis: No Has patient had a PCN reaction that required hospitalization No Has patient had a PCN reaction occurring within the last 10 years: No If all of the above answers are "NO", then may proceed with Cephalosporin use.   Sulfa Antibiotics Rash      Medication List    STOP taking these medications   benzonatate 200 MG capsule Commonly known as:  TESSALON   ciprofloxacin 500 MG tablet Commonly known as:  CIPRO   ondansetron 4 MG disintegrating tablet Commonly known as:  ZOFRAN ODT     TAKE these medications   acidophilus Caps capsule Take 1 capsule by mouth every evening.   apixaban 5 MG Tabs tablet Commonly known as:  ELIQUIS 10 mg po bid for 3 days, then 5 mg po bid.   aspirin EC 81 MG tablet Take 81 mg by mouth daily.   budesonide 0.25 MG/2ML nebulizer solution Commonly known as:  PULMICORT Take 2 mLs (0.25 mg total) by nebulization 2 (two) times daily.   Cholecalciferol 5000 units Tabs Take 5,000 Units by mouth daily.   Co Q-10 120 MG Caps Take 120 mg by mouth every evening.   dicyclomine 10 MG capsule Commonly known as:  BENTYL Take 1 capsule by mouth 3 (three) times daily.   digoxin 0.125 MG tablet Commonly known as:  LANOXIN Take 0.125 mg by mouth daily.   fluticasone 50 MCG/ACT nasal spray Commonly known as:  FLONASE Place 2 sprays into both nostrils daily.   furosemide 20 MG tablet Commonly known as:  LASIX Take 20 mg by  mouth every other day as needed for fluid.   gabapentin 600 MG tablet Commonly known as:  NEURONTIN Take 600 mg by mouth 2 (two) times daily.   guaiFENesin-codeine 100-10 MG/5ML syrup Commonly known as:  ROBITUSSIN AC Take 5 mLs by mouth 3 (three) times daily as needed for cough.   hydrocortisone 10 MG tablet Commonly known as:  CORTEF Take 10 mg by mouth 2 (two) times daily.   ipratropium-albuterol 0.5-2.5 (3) MG/3ML Soln Commonly known as:  DUONEB Take 3 mLs by nebulization every 4 (four) hours as needed.   levothyroxine 100 MCG tablet Commonly known as:  SYNTHROID, LEVOTHROID Take 100 mcg by mouth daily before breakfast.   magnesium oxide 400 (241.3 Mg) MG tablet Commonly known as:  MAG-OX Take 400 mg by mouth every evening.   methenamine 1 g tablet Commonly known as:  MANDELAMINE Take 1,000 mg by mouth 2 (two) times daily with a meal.   midodrine 10 MG tablet Commonly known as:  PROAMATINE Take 1 tablet (10 mg total) by mouth 3 (three) times daily as needed. If systolic BP <998 What changed:    when to take this  reasons to take this  additional instructions   multivitamin with minerals Tabs tablet Take 1 tablet by mouth daily.   pantoprazole 40 MG tablet Commonly known as:  PROTONIX Take 40 mg by mouth daily before breakfast.   potassium chloride 10  MEQ tablet Commonly known as:  K-DUR Take 10 mEq by mouth daily as needed (for fluid retention when taking furosemide).   pyridostigmine 60 MG tablet Commonly known as:  MESTINON Take 60 mg by mouth 2 (two) times daily.   ranitidine 300 MG tablet Commonly known as:  ZANTAC Take 300 mg by mouth every evening.   saw palmetto 500 MG capsule Take 1,000 mg by mouth every evening.   simvastatin 20 MG tablet Commonly known as:  ZOCOR Take 20 mg by mouth every evening.   vitamin B-12 500 MCG tablet Commonly known as:  CYANOCOBALAMIN Take 500 mcg by mouth daily.   vitamin C 1000 MG tablet Take 500 mg by  mouth 2 (two) times daily.   vitamin E 400 UNIT capsule Take 400 Units by mouth every evening.       Vitals:   06/24/17 0354 06/24/17 0735  BP: 135/85 128/67  Pulse: 86 89  Resp: 18   Temp: 97.9 F (36.6 C) 98.1 F (36.7 C)  SpO2: 98% 97%    Tele box removed and returned. Skin clean, dry and intact without evidence of skin break down, no evidence of skin tears noted. IV catheter discontinued intact. Site without signs and symptoms of complications. Dressing and pressure applied. Pt denies pain at this time. No complaints noted.  An After Visit Summary was printed and given to the patient. Patient escorted via [place], and D/C home via private auto.  Rolley Sims

## 2017-06-24 NOTE — Care Management (Signed)
Notified [facility] of discharge and orders for RN PT Aide SLP and outpatient palliative

## 2017-06-26 LAB — CULTURE, BLOOD (ROUTINE X 2)
CULTURE: NO GROWTH
CULTURE: NO GROWTH
SPECIAL REQUESTS: ADEQUATE
Special Requests: ADEQUATE

## 2017-07-01 ENCOUNTER — Other Ambulatory Visit: Payer: Self-pay | Admitting: Urology

## 2017-07-01 DIAGNOSIS — N390 Urinary tract infection, site not specified: Secondary | ICD-10-CM

## 2017-07-09 ENCOUNTER — Inpatient Hospital Stay: Payer: Medicare Other | Admitting: Pulmonary Disease

## 2017-07-09 ENCOUNTER — Ambulatory Visit (INDEPENDENT_AMBULATORY_CARE_PROVIDER_SITE_OTHER): Payer: Medicare Other | Admitting: Pulmonary Disease

## 2017-07-09 ENCOUNTER — Encounter: Payer: Self-pay | Admitting: Pulmonary Disease

## 2017-07-09 VITALS — BP 106/64 | HR 111 | Ht 70.0 in

## 2017-07-09 DIAGNOSIS — J9611 Chronic respiratory failure with hypoxia: Secondary | ICD-10-CM | POA: Diagnosis not present

## 2017-07-09 DIAGNOSIS — I2699 Other pulmonary embolism without acute cor pulmonale: Secondary | ICD-10-CM | POA: Diagnosis not present

## 2017-07-09 DIAGNOSIS — J701 Chronic and other pulmonary manifestations due to radiation: Secondary | ICD-10-CM | POA: Diagnosis not present

## 2017-07-09 DIAGNOSIS — IMO0002 Reserved for concepts with insufficient information to code with codable children: Secondary | ICD-10-CM

## 2017-07-09 DIAGNOSIS — Z85118 Personal history of other malignant neoplasm of bronchus and lung: Secondary | ICD-10-CM

## 2017-07-09 NOTE — Progress Notes (Signed)
PULMONARY OFFICE FOLLOW NOTE   PROBLEMS:  History of lung cancer, S/P XRT to R lung 2015 Extensive R lung radiation fibrosis Chronic hypoxemic respiratory failure Hospitalized 01/28-01/31/18 for severe sepsis and presumed PNA  DATA/EVENTS: PET 03/02/15: No findings suspicious for local recurrence or definite metastatic disease CT chest 11/10/15: Persistent volume loss, prominent fibrosis, bronchiectasis and interstitial infiltrates in the right upper lobe, with similar but less severe changes in the remaining of the right lung parenchyma. Increasing perifissural and peripleural nodular and linear interstitial thickening in the left lung. CT chest 05/23/17: Large chronic R pulmonary embolism occlusive in RUL. At least 50% stenosis R main PA. Ground-glass centrilobular nodules LLL may be infectious or inflammatory. Mildly worsening hilar lymphadenopathy. Similar extensive fibrosis with bronchiectasis seen with chronic interstitial lung disease. Chronic R moderate pleural effusion (reviewed by me)  INTERVAL HISTORY: ED visit 5/17 after he "passed out" on toilet.  Was suffering from severe nausea, vomiting, diarrhea at that time.  Discharged to home.  Returned 5/19 with continued vomiting and diarrhea and hypoxemia.  Discharged to home 5/22 with diagnosis of acute on chronic hypoxemic respiratory failure due to chemical pneumonitis.  SUBJ: As above.  He now feels that he is back to his baseline.  The nausea, vomiting and diarrhea have resolved.  His breathing feels like his usual (okay at rest, short of breath with minimal exertion).  He has no significant cough or sputum production.  He wears his oxygen compliantly.  He is on no bronchodilator therapy since none have provided him with much benefit. Denies CP, fever, purulent sputum, hemoptysis, LE edema and calf tenderness.   OBJ: Vitals:   07/09/17 1107 07/09/17 1109  BP:  106/64  Pulse:  (!) 111  SpO2:  94%  Height: 5\' 10"  (1.778 m)   2  LPM [place] pulse   Gen: No overt distress. In WC HEENT: NCAT, WNL Neck: No LAN, JVD Lungs: BS diminished on the right. No wheezes or other adventitious sounds Cardiovascular: RRR, no M Abdomen: soft, NABS Ext: no edema Neuro: No focal deficits   DATA: BMP Latest Ref Rng & Units 06/22/2017 06/21/2017 06/19/2017  Glucose 65 - 99 mg/dL 187(H) 149(H) 147(H)  BUN 6 - 20 mg/dL 21(H) 22(H) 15  Creatinine 0.61 - 1.24 mg/dL 1.01 1.13 1.13  Sodium 135 - 145 mmol/L 136 133(L) 140  Potassium 3.5 - 5.1 mmol/L 4.5 4.7 4.0  Chloride 101 - 111 mmol/L 103 100(L) 103  CO2 22 - 32 mmol/L 26 24 27   Calcium 8.9 - 10.3 mg/dL 8.1(L) 8.1(L) 9.2    CBC Latest Ref Rng & Units 06/22/2017 06/21/2017 06/19/2017  WBC 3.8 - 10.6 K/uL 8.3 9.0 9.0  Hemoglobin 13.0 - 18.0 g/dL 10.8(L) 11.8(L) 14.2  Hematocrit 40.0 - 52.0 % 31.9(L) 34.6(L) 42.7  Platelets 150 - 440 K/uL 207 222 263    CXR 519: NSC extensive opacification of R hemithorax with probable right pleural effusion  IMPRESSION: History of lung cancer  Radiation fibrosis (HCC)  Chronic hypoxemic respiratory failure (HCC)  Recent PE   PLAN: Continue oxygen therapy is close to 24 hours/day as possible Continue DuoNeb as needed for increased shortness of breath, wheezing, chest tightness, cough Continue Eliquis which should be a lifelong medication Follow-up in 3 months  Merton Border, MD PCCM service Mobile [phone] Pager [phone] 07/09/2017 5:33 PM

## 2017-07-09 NOTE — Patient Instructions (Signed)
Continue oxygen therapy is close to 24 hours/day as possible Continue DuoNeb as needed for increased shortness of breath, wheezing, chest tightness, cough  Follow-up in 3 months

## 2017-07-28 ENCOUNTER — Other Ambulatory Visit: Payer: Self-pay | Admitting: Urology

## 2017-07-28 DIAGNOSIS — N390 Urinary tract infection, site not specified: Secondary | ICD-10-CM

## 2017-08-31 ENCOUNTER — Emergency Department: Payer: Medicare Other

## 2017-08-31 ENCOUNTER — Other Ambulatory Visit: Payer: Self-pay

## 2017-08-31 ENCOUNTER — Inpatient Hospital Stay
Admission: EM | Admit: 2017-08-31 | Discharge: 2017-09-02 | DRG: 690 | Disposition: A | Discharge status: Home Health | Payer: Medicare Other | Attending: Internal Medicine | Admitting: Internal Medicine

## 2017-08-31 DIAGNOSIS — R627 Adult failure to thrive: Secondary | ICD-10-CM | POA: Diagnosis present

## 2017-08-31 DIAGNOSIS — I2510 Atherosclerotic heart disease of native coronary artery without angina pectoris: Secondary | ICD-10-CM | POA: Diagnosis present

## 2017-08-31 DIAGNOSIS — J449 Chronic obstructive pulmonary disease, unspecified: Secondary | ICD-10-CM | POA: Diagnosis present

## 2017-08-31 DIAGNOSIS — Z7982 Long term (current) use of aspirin: Secondary | ICD-10-CM

## 2017-08-31 DIAGNOSIS — Z880 Allergy status to penicillin: Secondary | ICD-10-CM

## 2017-08-31 DIAGNOSIS — Z87891 Personal history of nicotine dependence: Secondary | ICD-10-CM | POA: Diagnosis not present

## 2017-08-31 DIAGNOSIS — Z952 Presence of prosthetic heart valve: Secondary | ICD-10-CM | POA: Diagnosis not present

## 2017-08-31 DIAGNOSIS — R4182 Altered mental status, unspecified: Secondary | ICD-10-CM | POA: Diagnosis present

## 2017-08-31 DIAGNOSIS — I5032 Chronic diastolic (congestive) heart failure: Secondary | ICD-10-CM | POA: Diagnosis present

## 2017-08-31 DIAGNOSIS — N400 Enlarged prostate without lower urinary tract symptoms: Secondary | ICD-10-CM | POA: Diagnosis present

## 2017-08-31 DIAGNOSIS — Z66 Do not resuscitate: Secondary | ICD-10-CM | POA: Diagnosis present

## 2017-08-31 DIAGNOSIS — Z9221 Personal history of antineoplastic chemotherapy: Secondary | ICD-10-CM

## 2017-08-31 DIAGNOSIS — J9610 Chronic respiratory failure, unspecified whether with hypoxia or hypercapnia: Secondary | ICD-10-CM | POA: Diagnosis present

## 2017-08-31 DIAGNOSIS — Z95810 Presence of automatic (implantable) cardiac defibrillator: Secondary | ICD-10-CM

## 2017-08-31 DIAGNOSIS — Z923 Personal history of irradiation: Secondary | ICD-10-CM

## 2017-08-31 DIAGNOSIS — Z7901 Long term (current) use of anticoagulants: Secondary | ICD-10-CM

## 2017-08-31 DIAGNOSIS — Z85118 Personal history of other malignant neoplasm of bronchus and lung: Secondary | ICD-10-CM | POA: Diagnosis not present

## 2017-08-31 DIAGNOSIS — N3000 Acute cystitis without hematuria: Principal | ICD-10-CM | POA: Diagnosis present

## 2017-08-31 DIAGNOSIS — E7800 Pure hypercholesterolemia, unspecified: Secondary | ICD-10-CM | POA: Diagnosis present

## 2017-08-31 DIAGNOSIS — Z9981 Dependence on supplemental oxygen: Secondary | ICD-10-CM | POA: Diagnosis not present

## 2017-08-31 DIAGNOSIS — Z882 Allergy status to sulfonamides status: Secondary | ICD-10-CM

## 2017-08-31 DIAGNOSIS — Z7951 Long term (current) use of inhaled steroids: Secondary | ICD-10-CM

## 2017-08-31 DIAGNOSIS — E039 Hypothyroidism, unspecified: Secondary | ICD-10-CM | POA: Diagnosis present

## 2017-08-31 DIAGNOSIS — I429 Cardiomyopathy, unspecified: Secondary | ICD-10-CM | POA: Diagnosis present

## 2017-08-31 DIAGNOSIS — I4891 Unspecified atrial fibrillation: Secondary | ICD-10-CM | POA: Diagnosis present

## 2017-08-31 DIAGNOSIS — G629 Polyneuropathy, unspecified: Secondary | ICD-10-CM | POA: Diagnosis present

## 2017-08-31 DIAGNOSIS — R531 Weakness: Secondary | ICD-10-CM

## 2017-08-31 DIAGNOSIS — N390 Urinary tract infection, site not specified: Secondary | ICD-10-CM

## 2017-08-31 DIAGNOSIS — Z86711 Personal history of pulmonary embolism: Secondary | ICD-10-CM | POA: Diagnosis not present

## 2017-08-31 DIAGNOSIS — Z6824 Body mass index (BMI) 24.0-24.9, adult: Secondary | ICD-10-CM

## 2017-08-31 LAB — COMPREHENSIVE METABOLIC PANEL
ALT: 14 U/L (ref 0–44)
AST: 24 U/L (ref 15–41)
Albumin: 4.1 g/dL (ref 3.5–5.0)
Alkaline Phosphatase: 73 U/L (ref 38–126)
Anion gap: 11 (ref 5–15)
BUN: 26 mg/dL — ABNORMAL HIGH (ref 8–23)
CHLORIDE: 101 mmol/L (ref 98–111)
CO2: 28 mmol/L (ref 22–32)
CREATININE: 0.96 mg/dL (ref 0.61–1.24)
Calcium: 9.6 mg/dL (ref 8.9–10.3)
GFR calc Af Amer: >60 mL/min (ref >60)
Glucose, Bld: 102 mg/dL — ABNORMAL HIGH (ref 70–99)
POTASSIUM: 4.5 mmol/L (ref 3.5–5.1)
Sodium: 140 mmol/L (ref 135–145)
Total Bilirubin: 0.6 mg/dL (ref 0.3–1.2)
Total Protein: 8.5 g/dL — ABNORMAL HIGH (ref 6.5–8.1)

## 2017-08-31 LAB — URINALYSIS, COMPLETE (UACMP) WITH MICROSCOPIC
BACTERIA UA: NONE SEEN
Bilirubin Urine: NEGATIVE
GLUCOSE, UA: NEGATIVE mg/dL
Hgb urine dipstick: NEGATIVE
Ketones, ur: NEGATIVE mg/dL
Nitrite: NEGATIVE
Protein, ur: NEGATIVE mg/dL
Specific Gravity, Urine: 1.017 (ref 1.005–1.030)
WBC, UA: >50 WBC/hpf — ABNORMAL HIGH (ref 0–5)
pH: 6 (ref 5.0–8.0)

## 2017-08-31 LAB — FIBRIN DERIVATIVES D-DIMER (ARMC ONLY): Fibrin derivatives D-dimer (ARMC): 1468.35 ng/mL (FEU) — ABNORMAL HIGH (ref 0.00–499.00)

## 2017-08-31 LAB — LACTIC ACID, PLASMA: LACTIC ACID, VENOUS: 1.4 mmol/L (ref 0.5–1.9)

## 2017-08-31 LAB — TROPONIN I

## 2017-08-31 LAB — MRSA PCR SCREENING: MRSA BY PCR: NEGATIVE

## 2017-08-31 LAB — BRAIN NATRIURETIC PEPTIDE: B NATRIURETIC PEPTIDE 5: 110 pg/mL — AB (ref 0.0–100.0)

## 2017-08-31 MED ORDER — BUDESONIDE 0.25 MG/2ML IN SUSP
0.2500 mg | Freq: Two times a day (BID) | RESPIRATORY_TRACT | Status: DC
Start: 2017-08-31 — End: 2017-09-01

## 2017-08-31 MED ORDER — ONDANSETRON HCL 4 MG/2ML IJ SOLN: 4.0000 mg | Freq: Four times a day (QID) | INTRAMUSCULAR | Status: DC | PRN

## 2017-08-31 MED ORDER — SIMVASTATIN 20 MG PO TABS
20.0000 mg | ORAL_TABLET | Freq: Every evening | ORAL | Status: DC
  Administered 2017-08-31 – 2017-09-01 (×2): 20 mg via ORAL
  Filled 2017-08-31 (×2): qty 1

## 2017-08-31 MED ORDER — LEVOTHYROXINE SODIUM 100 MCG PO TABS
100.0000 ug | ORAL_TABLET | Freq: Every day | ORAL | Status: DC
  Administered 2017-09-01 – 2017-09-02 (×2): 100 ug via ORAL
  Filled 2017-08-31 (×2): qty 1

## 2017-08-31 MED ORDER — CO Q-10 120 MG PO CAPS: 120.0000 mg | ORAL_CAPSULE | Freq: Every evening | ORAL | Status: DC

## 2017-08-31 MED ORDER — DIGOXIN 250 MCG PO TABS
0.2500 mg | ORAL_TABLET | Freq: Every day | ORAL | Status: DC
  Administered 2017-09-01 – 2017-09-02 (×2): 0.25 mg via ORAL
  Filled 2017-08-31 (×3): qty 1

## 2017-08-31 MED ORDER — ONDANSETRON HCL 4 MG PO TABS
4.0000 mg | ORAL_TABLET | Freq: Four times a day (QID) | ORAL | Status: DC | PRN
Start: 2017-08-31 — End: 2017-09-02

## 2017-08-31 MED ORDER — VITAMIN D3 25 MCG (1000 UNIT) PO TABS
5000.0000 [IU] | ORAL_TABLET | Freq: Every day | ORAL | Status: DC
  Administered 2017-09-01 – 2017-09-02 (×2): 5000 [IU] via ORAL
  Filled 2017-08-31 (×5): qty 5

## 2017-08-31 MED ORDER — SODIUM CHLORIDE 0.9 % IV BOLUS
500.0000 mL | Freq: Once | INTRAVENOUS | Status: AC
  Administered 2017-08-31: 500 mL via INTRAVENOUS

## 2017-08-31 MED ORDER — CEFTRIAXONE SODIUM 1 G IJ SOLR
1.0000 g | INTRAMUSCULAR | Status: DC
  Administered 2017-09-01: 18:00:00 1 g via INTRAVENOUS
  Filled 2017-08-31: qty 10
  Filled 2017-08-31: qty 1

## 2017-08-31 MED ORDER — IPRATROPIUM-ALBUTEROL 0.5-2.5 (3) MG/3ML IN SOLN: 3.0000 mL | RESPIRATORY_TRACT | Status: DC | PRN

## 2017-08-31 MED ORDER — RISAQUAD PO CAPS
1.0000 | ORAL_CAPSULE | Freq: Every evening | ORAL | Status: DC
  Administered 2017-08-31 – 2017-09-01 (×2): 1 via ORAL
  Filled 2017-08-31 (×2): qty 1

## 2017-08-31 MED ORDER — SODIUM CHLORIDE 0.9 % IV SOLN
INTRAVENOUS | Status: AC
  Administered 2017-08-31 – 2017-09-01 (×2): via INTRAVENOUS

## 2017-08-31 MED ORDER — ACETAMINOPHEN 650 MG RE SUPP: 650.0000 mg | Freq: Four times a day (QID) | RECTAL | Status: DC | PRN

## 2017-08-31 MED ORDER — METHENAMINE MANDELATE 1 G PO TABS
1.0000 g | ORAL_TABLET | Freq: Four times a day (QID) | ORAL | Status: DC
  Administered 2017-08-31 – 2017-09-01 (×2): 1 g via ORAL
  Filled 2017-08-31 (×2): qty 2
  Filled 2017-08-31: qty 1
  Filled 2017-08-31: qty 2
  Filled 2017-08-31: qty 1
  Filled 2017-08-31 (×3): qty 2
  Filled 2017-08-31: qty 1
  Filled 2017-08-31: qty 2
  Filled 2017-08-31: qty 1
  Filled 2017-08-31: qty 2

## 2017-08-31 MED ORDER — APIXABAN 5 MG PO TABS
5.0000 mg | ORAL_TABLET | Freq: Two times a day (BID) | ORAL | Status: DC
  Administered 2017-08-31 – 2017-09-02 (×4): 5 mg via ORAL
  Filled 2017-08-31 (×4): qty 1

## 2017-08-31 MED ORDER — ASPIRIN EC 81 MG PO TBEC
81.0000 mg | DELAYED_RELEASE_TABLET | Freq: Every day | ORAL | Status: DC
  Administered 2017-09-01 – 2017-09-02 (×2): 81 mg via ORAL
  Filled 2017-08-31 (×2): qty 1

## 2017-08-31 MED ORDER — PANTOPRAZOLE SODIUM 40 MG PO TBEC
40.0000 mg | DELAYED_RELEASE_TABLET | Freq: Every day | ORAL | Status: DC
  Administered 2017-09-01 – 2017-09-02 (×2): 40 mg via ORAL
  Filled 2017-08-31 (×2): qty 1

## 2017-08-31 MED ORDER — ADULT MULTIVITAMIN W/MINERALS CH
1.0000 | ORAL_TABLET | Freq: Every day | ORAL | Status: DC
  Administered 2017-09-01 – 2017-09-02 (×2): 1 via ORAL
  Filled 2017-08-31 (×2): qty 1

## 2017-08-31 MED ORDER — GABAPENTIN 600 MG PO TABS
600.0000 mg | ORAL_TABLET | Freq: Two times a day (BID) | ORAL | Status: DC
  Administered 2017-08-31 – 2017-09-02 (×4): 600 mg via ORAL
  Filled 2017-08-31 (×4): qty 1

## 2017-08-31 MED ORDER — MIDODRINE HCL 5 MG PO TABS
10.0000 mg | ORAL_TABLET | Freq: Three times a day (TID) | ORAL | Status: DC | PRN
  Administered 2017-09-01 – 2017-09-02 (×2): 10 mg via ORAL
  Filled 2017-08-31 (×3): qty 2

## 2017-08-31 MED ORDER — VITAMIN B-12 1000 MCG PO TABS
500.0000 ug | ORAL_TABLET | Freq: Every day | ORAL | Status: DC
  Administered 2017-09-01 – 2017-09-02 (×2): 500 ug via ORAL
  Filled 2017-08-31 (×2): qty 1

## 2017-08-31 MED ORDER — HYDROCORTISONE 10 MG PO TABS
10.0000 mg | ORAL_TABLET | Freq: Two times a day (BID) | ORAL | Status: DC
  Administered 2017-08-31 – 2017-09-02 (×4): 10 mg via ORAL
  Filled 2017-08-31 (×5): qty 1

## 2017-08-31 MED ORDER — VITAMIN C 500 MG PO TABS
500.0000 mg | ORAL_TABLET | Freq: Two times a day (BID) | ORAL | Status: DC
  Administered 2017-08-31 – 2017-09-02 (×4): 500 mg via ORAL
  Filled 2017-08-31 (×5): qty 1

## 2017-08-31 MED ORDER — VITAMIN E 180 MG (400 UNIT) PO CAPS
400.0000 [IU] | ORAL_CAPSULE | Freq: Every evening | ORAL | Status: DC
  Administered 2017-08-31 – 2017-09-01 (×2): 400 [IU] via ORAL
  Filled 2017-08-31 (×3): qty 1

## 2017-08-31 MED ORDER — MAGNESIUM OXIDE 400 (241.3 MG) MG PO TABS
400.0000 mg | ORAL_TABLET | Freq: Every evening | ORAL | Status: DC
  Administered 2017-08-31 – 2017-09-01 (×2): 400 mg via ORAL
  Filled 2017-08-31 (×2): qty 1

## 2017-08-31 MED ORDER — FLUTICASONE PROPIONATE 50 MCG/ACT NA SUSP
2.0000 | Freq: Every day | NASAL | Status: DC
  Administered 2017-09-01: 2 via NASAL
  Filled 2017-08-31: qty 16

## 2017-08-31 MED ORDER — SODIUM CHLORIDE 0.9 % IV SOLN
1.0000 g | Freq: Once | INTRAVENOUS | Status: AC
  Administered 2017-08-31: 1 g via INTRAVENOUS
  Filled 2017-08-31: qty 10

## 2017-08-31 MED ORDER — LIDOCAINE HCL (PF) 1 % IJ SOLN
INTRAMUSCULAR | Status: AC
  Filled 2017-08-31: qty 5

## 2017-08-31 MED ORDER — PYRIDOSTIGMINE BROMIDE 60 MG PO TABS
60.0000 mg | ORAL_TABLET | Freq: Two times a day (BID) | ORAL | Status: DC
  Administered 2017-08-31 – 2017-09-02 (×4): 60 mg via ORAL
  Filled 2017-08-31 (×5): qty 1

## 2017-08-31 MED ORDER — ACETAMINOPHEN 325 MG PO TABS: 650.0000 mg | ORAL_TABLET | Freq: Four times a day (QID) | ORAL | Status: DC | PRN

## 2017-08-31 NOTE — ED Notes (Signed)
Pt self cath'd for urine specimen with family & RN assistance

## 2017-08-31 NOTE — ED Triage Notes (Signed)
To ER via [facility] from home for increasing SOB and weakness over past few days. VSS with EMS. CBG 89 with EMS. Pt wears 2L [place] chronically. Alert and oriented X 4.

## 2017-08-31 NOTE — Progress Notes (Signed)

## 2017-08-31 NOTE — Progress Notes (Signed)
   [place] at [facility] Day: 0 days Russell Howard is a 83 y.o. male presenting with Weakness and Shortness of Breath .   Advance care planning discussed with patient And his daughter at bedside.  Daughter Ms. Russell Howard is his healthcare power of attorney. All questions in regards to overall condition and expected prognosis answered.  Patient and family said that patient would not want to be put on life support or would want any aggressive measures.  They decided to make him a DNR.    CODE STATUS: DNR Time spent: 18 minutes

## 2017-08-31 NOTE — H&P (Addendum)
[place] at [place] NAME: Russell Howard    MR#:  [id number]  DATE OF BIRTH:  03/06/1934  DATE OF ADMISSION:  08/31/2017  PRIMARY CARE PHYSICIAN: [name], MD   REQUESTING/REFERRING PHYSICIAN: Dr. Arta Silence  CHIEF COMPLAINT:   Chief Complaint  Patient presents with  . Weakness  . Shortness of Breath    HISTORY OF PRESENT ILLNESS:  Russell Howard  is a 83 y.o. male with a known history of CAD, COPD on 2l home o2, history of lung cancer status post chemoradiation, chronic diastolic heart failure, BPH requiring self-catheterization, history of A. fib and PE on Eliquis presents to hospital secondary to worsening weakness, fevers and confusion. Most of the history is obtained from daughter at bedside.  Patient has been complaining of increased weakness and also was noted to be confused yesterday.  This morning he was having chills and low-grade fever and so brought to the emergency room.  Urine analysis consistent with UTI.  Due to his history of self-catheterization, he has had UTIs in the past.  According to daughter, his urine was dark in the last couple of days.  No odour to it.  Patient denies any dysuria.  No nausea or vomiting.  No abdominal pain.  Patient has chronic shortness of breath secondary to his COPD, history of lung cancer and radiation and history of PE.  Uses chronic home oxygen.  No change in his dyspnea noted.  PAST MEDICAL HISTORY:   Past Medical History:  Diagnosis Date  . Acquired hypothyroidism 11/02/2014  . BPH (benign prostatic hyperplasia)   . Cardiac defibrillator in place 11/02/2014  . Cardiomyopathy ([place])   . CHF (congestive heart failure) ([place])   . Chronic diastolic heart failure ([place]) 11/02/2014  . COPD (chronic obstructive pulmonary disease) ([place])   . Gastro-esophageal reflux disease without esophagitis 11/02/2014  . H/O malignant neoplasm 11/09/2014  . Heart valve disease 11/02/2014  . History of  atrial fibrillation 11/02/2014  . Lung cancer ([place])   . Neuropathy 11/02/2014  . Orthostasis 11/02/2014  . Pure hypercholesterolemia 11/02/2014  . Valvular heart disease     PAST SURGICAL HISTORY:   Past Surgical History:  Procedure Laterality Date  . APPENDECTOMY    . CARDIAC VALVE REPLACEMENT     with a bovine valve  . DUAL ICD IMPLANT  2007/2009   implantable cardiac defibrillator  . PORT-A-CATH REMOVAL      SOCIAL HISTORY:   Social History   Tobacco Use  . Smoking status: Former Smoker    Types: Cigarettes    Last attempt to quit: 11/21/1981    Years since quitting: 35.8  . Smokeless tobacco: Never Used  Substance Use Topics  . Alcohol use: No    Alcohol/week: 0.0 oz    FAMILY HISTORY:   Family History  Problem Relation Age of Onset  . Hypertension Unknown   . Heart disease Unknown   . CAD Father     DRUG ALLERGIES:   Allergies  Allergen Reactions  . Penicillin G Hives    Has patient had a PCN reaction causing immediate rash, facial/tongue/throat swelling, SOB or lightheadedness with hypotension: Yes Has patient had a PCN reaction causing severe rash involving mucus membranes or skin necrosis: No Has patient had a PCN reaction that required hospitalization No Has patient had a PCN reaction occurring within the last 10 years: No If all of the above answers are "NO", then may proceed with Cephalosporin use.  Marland Kitchen  Sulfa Antibiotics Rash    REVIEW OF SYSTEMS:   Review of Systems  Constitutional: Positive for fever and malaise/fatigue. Negative for chills and weight loss.  HENT: Negative for ear discharge, ear pain, hearing loss, nosebleeds and tinnitus.   Eyes: Negative for blurred vision, double vision and photophobia.  Respiratory: Negative for cough, hemoptysis, shortness of breath and wheezing.   Cardiovascular: Negative for chest pain, palpitations, orthopnea and leg swelling.  Gastrointestinal: Negative for abdominal pain, constipation, diarrhea,  heartburn, melena, nausea and vomiting.  Genitourinary: Negative for dysuria, frequency, hematuria and urgency.  Musculoskeletal: Negative for back pain, myalgias and neck pain.  Skin: Negative for rash.  Neurological: Positive for weakness. Negative for dizziness, tingling, tremors, sensory change, speech change, focal weakness and headaches.  Endo/Heme/Allergies: Does not bruise/bleed easily.  Psychiatric/Behavioral: Negative for depression.       Confusion    MEDICATIONS AT HOME:   Prior to Admission medications   Medication Sig Start Date End Date Taking? Authorizing Provider  acidophilus (RISAQUAD) CAPS capsule Take 1 capsule by mouth every evening.    Yes [provider]  apixaban (ELIQUIS) 5 MG TABS tablet 10 mg po bid for 3 days, then 5 mg po bid. Patient taking differently: Take 5 mg by mouth 2 (two) times daily.  05/29/17  Yes Demetrios Loll, MD  Ascorbic Acid (VITAMIN C) 1000 MG tablet Take 500 mg by mouth 2 (two) times daily.    Yes [provider]  aspirin EC 81 MG tablet Take 81 mg by mouth daily.    Yes [provider]  budesonide (PULMICORT) 0.25 MG/2ML nebulizer solution Take 2 mLs (0.25 mg total) by nebulization 2 (two) times daily. 05/29/17  Yes Demetrios Loll, MD  Cholecalciferol 5000 units TABS Take 5,000 Units by mouth daily.   Yes [provider]  Coenzyme Q10 (CO Q-10) 120 MG CAPS Take 120 mg by mouth every evening.   Yes [provider]  digoxin (LANOXIN) 0.25 MG tablet Take 0.25 mg by mouth daily.    Yes [provider]  fluticasone (FLONASE) 50 MCG/ACT nasal spray Place 2 sprays into both nostrils daily. 05/29/17  Yes Demetrios Loll, MD  gabapentin (NEURONTIN) 600 MG tablet Take 600 mg by mouth 2 (two) times daily.   Yes [provider]  hydrocortisone (CORTEF) 10 MG tablet Take 10 mg by mouth 2 (two) times daily.    Yes [provider]  ipratropium-albuterol (DUONEB) 0.5-2.5 (3) MG/3ML SOLN Take 3 mLs by  nebulization every 4 (four) hours as needed. 05/29/17  Yes Demetrios Loll, MD  levothyroxine (SYNTHROID, LEVOTHROID) 100 MCG tablet Take 100 mcg by mouth daily before breakfast.   Yes [provider]  magnesium oxide (MAG-OX) 400 (241.3 Mg) MG tablet Take 400 mg by mouth every evening.   Yes [provider]  methenamine (HIPREX) 1 g tablet TAKE 1 TABLET BY MOUTH TWICE DAILY WITH MEALS 07/29/17  Yes McGowan, Larene [place] A, PA-C  midodrine (PROAMATINE) 10 MG tablet Take 1 tablet (10 mg total) by mouth 3 (three) times daily as needed. If systolic BP <016 0/10/93  Yes Fritzi Mandes, MD  Multiple Vitamin (MULTIVITAMIN WITH MINERALS) TABS tablet Take 1 tablet by mouth daily.   Yes [provider]  pantoprazole (PROTONIX) 40 MG tablet Take 40 mg by mouth daily before breakfast.    Yes [provider]  pyridostigmine (MESTINON) 60 MG tablet Take 60 mg by mouth 2 (two) times daily. 03/11/17  Yes [provider]  saw  palmetto 500 MG capsule Take 1,000 mg by mouth every evening.   Yes [provider]  simvastatin (ZOCOR) 20 MG tablet Take 20 mg by mouth every evening.    Yes [provider]  vitamin B-12 (CYANOCOBALAMIN) 500 MCG tablet Take 500 mcg by mouth daily.    Yes [provider]  vitamin E 400 UNIT capsule Take 400 Units by mouth every evening.    Yes [provider]  furosemide (LASIX) 20 MG tablet Take 20 mg by mouth every other day as needed for fluid.     [provider]  potassium chloride (K-DUR) 10 MEQ tablet Take 10 mEq by mouth daily as needed (for fluid retention when taking furosemide).    [provider]      VITAL SIGNS:  Blood pressure 122/78, pulse 99, temperature 98 F (36.7 C), temperature source Oral, resp. rate (!) 31, height 5\' 10"  (1.778 m), weight 77.1 kg (170 lb), SpO2 99 %.  PHYSICAL EXAMINATION:   Physical Exam  GENERAL:  83 y.o.-year-old elderly patient lying in the bed with no acute  distress.  EYES: Pupils equal, round, reactive to light and accommodation. No scleral icterus. Extraocular muscles intact.  HEENT: Head atraumatic, normocephalic. Oropharynx and nasopharynx clear.  NECK:  Supple, no jugular venous distention. No thyroid enlargement, no tenderness.  LUNGS: Normal breath sounds bilaterally, no wheezing, rales,rhonchi or crepitation. No use of accessory muscles of respiration.  CARDIOVASCULAR: S1, S2 normal. No  rubs, or gallops.  ABDOMEN: Soft, nontender, nondistended. Bowel sounds present. No organomegaly or mass.  EXTREMITIES: No pedal edema, cyanosis, or clubbing. 3/6 systolic murmur present, s/p AICD/pacemaker left chest NEUROLOGIC: Cranial nerves II through XII are intact. Muscle strength 5/5 in all extremities. Sensation intact. Gait not checked.  PSYCHIATRIC: The patient is alert and oriented x 3. Some intermittent confusion noted. SKIN: No obvious rash, lesion, or ulcer.   LABORATORY PANEL:   CBC No results for input(s): WBC, HGB, HCT, PLT in the last 168 hours. ------------------------------------------------------------------------------------------------------------------  Chemistries  Recent Labs  Lab 08/31/17 1605  NA 140  K 4.5  CL 101  CO2 28  GLUCOSE 102*  BUN 26*  CREATININE 0.96  CALCIUM 9.6  AST 24  ALT 14  ALKPHOS 73  BILITOT 0.6   ------------------------------------------------------------------------------------------------------------------  Cardiac Enzymes Recent Labs  Lab 08/31/17 1605  TROPONINI <0.03   ------------------------------------------------------------------------------------------------------------------  RADIOLOGY:  Dg Chest Portable 1 View  Result Date: 08/31/2017 CLINICAL DATA:  Increasing shortness of Breath EXAM: PORTABLE CHEST 1 VIEW COMPARISON:  06/21/2017 FINDINGS: Cardiac shadow is stable. Postsurgical changes are noted as well as defibrillator. Chronic right pleural effusion and diffuse  interstitial changes throughout the right lung with volume loss. No new focal abnormality is seen. IMPRESSION: Chronic changes on the right stable from the previous exam. Electronically Signed   By: Inez Catalina M.D.   On: 08/31/2017 16:10    EKG:   Orders placed or performed during the hospital encounter of 06/21/17  . EKG 12-Lead  . EKG 12-Lead  . EKG    IMPRESSION AND PLAN:   Russell Howard  is a 83 y.o. male with a known history of CAD, COPD on 2l home o2, history of lung cancer status post chemoradiation, chronic diastolic heart failure, BPH requiring self-catheterization, history of A. Fib and PE on Eliquis presents to hospital secondary to worsening weakness, fevers and confusion  1. Acute cystitis- patient self catheterizes at home due to BPH -Admit, follow-up urine cultures. -Started on Rocephin -  PT consult for weakness  2.  History of pulmonary embolism-on Eliquis.  Diagnosed in April 2019. -Has chronic shortness of breath.  On home oxygen.  Also has had a history of lung cancer status post chemo and radiation  3.  Atrial fibrillation-paced rhythm, rate controlled.  Continue digoxin -On Eliquis for anticoagulation  4.  Chronic orthostasis- on solucortef and midodrine  5.  DVT prophylaxis-already on Eliquis  PT consult   All the records are reviewed and case discussed with ED provider. Management plans discussed with the patient, family and they are in agreement.  CODE STATUS: DNR  TOTAL TIME TAKING CARE OF THIS PATIENT: 50 minutes.    Gladstone Lighter M.D on 08/31/2017 at 8:23 PM  Between 7am to 6pm - Pager - [phone]  After 6pm go to [email or web address] - password [email or web address] [facility]  Office  [phone]  CC: Primary care physician; [name], MD

## 2017-08-31 NOTE — ED Provider Notes (Signed)
[facility] Emergency Department Provider Note ____________________________________________   First MD Initiated Contact with Patient 08/31/17 1537     (approximate)  I have reviewed the triage vital signs and the nursing notes.   HISTORY  Chief Complaint Weakness and Shortness of Breath    HPI Russell Howard is a 83 y.o. male with PMH as noted below who presents with generalized weakness over the last 1 to 2 days, gradual onset, worsening, and associated with some shortness of breath as well as chills.  The patient denies associated chest pain, vomiting or diarrhea, or other focal symptoms.  Past Medical History:  Diagnosis Date  . Acquired hypothyroidism 11/02/2014  . BPH (benign prostatic hyperplasia)   . Cardiac defibrillator in place 11/02/2014  . Cardiomyopathy (Russell)   . CHF (congestive heart failure) ([place])   . Chronic diastolic heart failure ([place]) 11/02/2014  . COPD (chronic obstructive pulmonary disease) ([place])   . Gastro-esophageal reflux disease without esophagitis 11/02/2014  . H/O malignant neoplasm 11/09/2014  . Heart valve disease 11/02/2014  . History of atrial fibrillation 11/02/2014  . Lung cancer ([place])   . Neuropathy 11/02/2014  . Orthostasis 11/02/2014  . Pure hypercholesterolemia 11/02/2014  . Valvular heart disease     Patient Active Problem List   Diagnosis Date Noted  . Advance care planning   . Adjustment disorder with mixed anxiety and depressed mood   . Grief   . Acute respiratory failure ([place]) 06/21/2017  . UTI (urinary tract infection) 11/17/2016  . Empyema ([place])   . Weakness   . Community acquired pneumonia   . Pleural effusion   . Palliative care by specialist   . Acute on chronic respiratory failure with hypoxia ([place]) 08/19/2016  . Acute respiratory failure with hypoxia ([place]) 08/19/2016  . Septic shock ([place]) 03/02/2016  . Hydronephrosis, right 01/14/2016  . Constipation 01/13/2016  . Urinary retention 01/13/2016  .  Lower abdominal pain 01/13/2016  . Bacteremia 01/13/2016  . Hypotension 01/13/2016  . Palliative care encounter   . Goals of care, counseling/discussion   . Cough   . Encounter for hospice care discussion   . Hypoxia 01/09/2016  . Primary cancer of bronchus of right lower lobe ([place]) 11/29/2015  . Sepsis ([place]) 06/14/2015  . Acute lower UTI 06/14/2015    Class: Acute  . H/O malignant neoplasm 11/09/2014  . Acquired hypothyroidism 11/02/2014  . Cardiomyopathy ([place]) 11/02/2014  . Acute on chronic diastolic CHF (congestive heart failure) ([place]) 11/02/2014  . Diabetes mellitus without complication ([place]) 00/34/9179  . Cardiac defibrillator in place 11/02/2014  . Gastro-esophageal reflux disease without esophagitis 11/02/2014  . History of atrial fibrillation 11/02/2014  . BP (high blood pressure) 11/02/2014  . Neuropathy 11/02/2014  . Orthostasis 11/02/2014  . Pure hypercholesterolemia 11/02/2014  . Heart valve disease 11/02/2014    Past Surgical History:  Procedure Laterality Date  . APPENDECTOMY    . CARDIAC VALVE REPLACEMENT     with a bovine valve  . DUAL ICD IMPLANT  2007/2009   implantable cardiac defibrillator  . PORT-A-CATH REMOVAL      Prior to Admission medications   Medication Sig Start Date End Date Taking? Authorizing Provider  acidophilus (RISAQUAD) CAPS capsule Take 1 capsule by mouth every evening.    Yes [provider]  apixaban (ELIQUIS) 5 MG TABS tablet 10 mg po bid for 3 days, then 5 mg po bid. Patient taking differently: Take 5 mg by mouth 2 (two) times daily.  05/29/17  Yes  Demetrios Loll, MD  Ascorbic Acid (VITAMIN C) 1000 MG tablet Take 500 mg by mouth 2 (two) times daily.    Yes [provider]  aspirin EC 81 MG tablet Take 81 mg by mouth daily.    Yes [provider]  budesonide (PULMICORT) 0.25 MG/2ML nebulizer solution Take 2 mLs (0.25 mg total) by nebulization 2 (two) times daily. 05/29/17  Yes Demetrios Loll, MD  Cholecalciferol  5000 units TABS Take 5,000 Units by mouth daily.   Yes [provider]  Coenzyme Q10 (CO Q-10) 120 MG CAPS Take 120 mg by mouth every evening.   Yes [provider]  digoxin (LANOXIN) 0.25 MG tablet Take 0.25 mg by mouth daily.    Yes [provider]  fluticasone (FLONASE) 50 MCG/ACT nasal spray Place 2 sprays into both nostrils daily. 05/29/17  Yes Demetrios Loll, MD  gabapentin (NEURONTIN) 600 MG tablet Take 600 mg by mouth 2 (two) times daily.   Yes [provider]  hydrocortisone (CORTEF) 10 MG tablet Take 10 mg by mouth 2 (two) times daily.    Yes [provider]  ipratropium-albuterol (DUONEB) 0.5-2.5 (3) MG/3ML SOLN Take 3 mLs by nebulization every 4 (four) hours as needed. 05/29/17  Yes Demetrios Loll, MD  levothyroxine (SYNTHROID, LEVOTHROID) 100 MCG tablet Take 100 mcg by mouth daily before breakfast.   Yes [provider]  magnesium oxide (MAG-OX) 400 (241.3 Mg) MG tablet Take 400 mg by mouth every evening.   Yes [provider]  methenamine (HIPREX) 1 g tablet TAKE 1 TABLET BY MOUTH TWICE DAILY WITH MEALS 07/29/17  Yes McGowan, Larene Beach A, PA-C  midodrine (PROAMATINE) 10 MG tablet Take 1 tablet (10 mg total) by mouth 3 (three) times daily as needed. If systolic BP <144 04/18/38  Yes Fritzi Mandes, MD  Multiple Vitamin (MULTIVITAMIN WITH MINERALS) TABS tablet Take 1 tablet by mouth daily.   Yes [provider]  pantoprazole (PROTONIX) 40 MG tablet Take 40 mg by mouth daily before breakfast.    Yes [provider]  pyridostigmine (MESTINON) 60 MG tablet Take 60 mg by mouth 2 (two) times daily. 03/11/17  Yes [provider]  saw palmetto 500 MG capsule Take 1,000 mg by mouth every evening.   Yes [provider]  simvastatin (ZOCOR) 20 MG tablet Take 20 mg by mouth every evening.    Yes [provider]  vitamin B-12 (CYANOCOBALAMIN) 500 MCG tablet Take 500 mcg by mouth daily.    Yes [provider]  vitamin E 400 UNIT capsule Take 400 Units by mouth every evening.    Yes [provider]  furosemide (LASIX) 20 MG tablet Take 20 mg by mouth every other day as needed for fluid.     [provider]  potassium chloride (K-DUR) 10 MEQ tablet Take 10 mEq by mouth daily as needed (for fluid retention when taking furosemide).    [provider]    Allergies Penicillin g and Sulfa antibiotics  Family History  Problem Relation Age of Onset  . Hypertension Unknown   . Heart disease Unknown   . CAD Father     Social History Social History   Tobacco Use  . Smoking status: Former Smoker    Types: Cigarettes    Last attempt to quit: 11/21/1981    Years since quitting: 35.8  . Smokeless tobacco: Never Used  Substance Use Topics  . Alcohol use: No    Alcohol/week: 0.0 oz  . Drug  use: No    Review of Systems  Constitutional: Positive for chills. Eyes: No redness. ENT: No sore throat. Cardiovascular: Denies chest pain. Respiratory: Positive for shortness of breath. Gastrointestinal: No vomiting. Genitourinary: Negative for dysuria.  Musculoskeletal: Negative for back pain. Skin: Negative for rash. Neurological: Negative for headache.   ____________________________________________   PHYSICAL EXAM:  VITAL SIGNS: ED Triage Vitals  Enc Vitals Group     BP 08/31/17 1523 127/84     Pulse Rate 08/31/17 1523 (!) 113     Resp 08/31/17 1523 (!) 28     Temp 08/31/17 1523 98 F (36.7 C)     Temp Source 08/31/17 1523 Oral     SpO2 08/31/17 1523 97 %     Weight 08/31/17 1524 170 lb (77.1 kg)     Height 08/31/17 1524 5\' 10"  (1.778 m)     Head Circumference --      Peak Flow --      Pain Score 08/31/17 1524 0     Pain Loc --      Pain Edu? --      Excl. in [place]? --     Constitutional: Alert and oriented.  Weak appearing but in no acute distress. Eyes: Conjunctivae are normal.  EOMI. Head: Atraumatic. Nose: No  congestion/rhinnorhea. Mouth/Throat: Mucous membranes are somewhat dry.   Neck: Normal range of motion.  Cardiovascular: Tachycardic, regular rhythm. Grossly normal heart sounds.  Good peripheral circulation. Respiratory: Slightly increased respiratory effort.  No retractions. Lungs CTAB. Gastrointestinal: Soft and nontender. No distention.  Genitourinary: No flank tenderness. Musculoskeletal: No lower extremity edema.  Extremities warm and well perfused.  Neurologic:  Normal speech and language. No gross focal neurologic deficits are appreciated.  Skin:  Skin is warm and dry. No rash noted. Psychiatric: Mood and affect are normal. Speech and behavior are normal.  ____________________________________________   LABS (all labs ordered are listed, but only abnormal results are displayed)  Labs Reviewed  COMPREHENSIVE METABOLIC PANEL - Abnormal; Notable for the following components:      Result Value   Glucose, Bld 102 (*)    BUN 26 (*)    Total Protein 8.5 (*)    All other components within normal limits  BRAIN NATRIURETIC PEPTIDE - Abnormal; Notable for the following components:   B Natriuretic Peptide 110.0 (*)    All other components within normal limits  URINALYSIS, COMPLETE (UACMP) WITH MICROSCOPIC - Abnormal; Notable for the following components:   Color, Urine YELLOW (*)    APPearance HAZY (*)    Leukocytes, UA LARGE (*)    WBC, UA >50 (*)    Non Squamous Epithelial 0-5 (*)    All other components within normal limits  FIBRIN DERIVATIVES D-DIMER ([facility] ONLY) - Abnormal; Notable for the following components:   Fibrin derivatives D-dimer [facility]) 1,696.78 (*)    All other components within normal limits  LACTIC ACID, PLASMA  TROPONIN I   ____________________________________________  EKG  ED ECG REPORT I, Arta Silence, the attending physician, personally viewed and interpreted this ECG.  Date: 08/31/2017 EKG Time: 1522 Rate: 110 Rhythm: Paced rhythm QRS Axis:  normal Intervals: normal ST/T Wave abnormalities: normal Narrative Interpretation: no evidence of acute ischemia; no significant change when compared to EKG of 06/21/2017 ____________________________________________  RADIOLOGY  CXR: No focal infiltrate  ____________________________________________   PROCEDURES  Procedure(s) performed: No  Procedures  Critical Care performed: No ____________________________________________   INITIAL IMPRESSION / ASSESSMENT AND PLAN / ED COURSE  Pertinent labs &  imaging results that were available during my care of the patient were reviewed by me and considered in my medical decision making (see chart for details).  83 year old male with PMH as noted above presents with generalized weakness over the last several days along with some shortness of breath and increased oxygen requirement.  The patient is normally on 2 L.  He denies any other focal symptoms.  I reviewed the past medical records in [place]; the patient was most recently admitted in May of this year for respiratory failure related to suspected chemical pneumonitis and was ruled out for sepsis.  On exam, the patient is weak appearing but alert and oriented.  The remainder of the exam is as described above.  Differential includes primarily infection/sepsis including UTI or pneumonia as the source, dehydration or other metabolic etiology, or less likely cardiac cause.  Will obtain lab work-up, chest x-ray, give fluids, and reassess.  ----------------------------------------- 6:44 PM on 08/31/2017 -----------------------------------------  The patient's work-up is consistent with UTI.  His chest x-ray shows no significant acute findings.  I had a further discussion with the patient's daughter who is now present.  Per her, he has been extremely weak, and was somewhat confused yesterday.  Although his labs are not consistent with sepsis, given his tachypnea, generalized weakness, and hypoxia at  home, he will benefit from admission for IV antibiotics.  I ordered ceftriaxone and signed the patient out to the hospitalist Dr. Tressia Miners. ____________________________________________   FINAL CLINICAL IMPRESSION(S) / ED DIAGNOSES  Final diagnoses:  Urinary tract infection without hematuria, site unspecified  Generalized weakness      NEW MEDICATIONS STARTED DURING THIS VISIT:  New Prescriptions   No medications on file     Note:  This document was prepared using Dragon voice recognition software and may include unintentional dictation errors.    Arta Silence, MD 08/31/17 [phone]

## 2017-08-31 NOTE — ED Notes (Signed)
LAB  CALLED  NEED BLUE  TOP  TUBE INFORMED  RN CHRISTINA

## 2017-09-01 LAB — CBC
HEMATOCRIT: 41 % (ref 40.0–52.0)
Hemoglobin: 13.8 g/dL (ref 13.0–18.0)
MCH: 30.3 pg (ref 26.0–34.0)
MCHC: 33.8 g/dL (ref 32.0–36.0)
MCV: 89.8 fL (ref 80.0–100.0)
Platelets: 188 10*3/uL (ref 150–440)
RBC: 4.56 MIL/uL (ref 4.40–5.90)
RDW: 15.8 % — AB (ref 11.5–14.5)
WBC: 9.9 10*3/uL (ref 3.8–10.6)

## 2017-09-01 LAB — BASIC METABOLIC PANEL
Anion gap: 7 (ref 5–15)
BUN: 25 mg/dL — AB (ref 8–23)
CHLORIDE: 104 mmol/L (ref 98–111)
CO2: 30 mmol/L (ref 22–32)
Calcium: 8.9 mg/dL (ref 8.9–10.3)
Creatinine, Ser: 1.02 mg/dL (ref 0.61–1.24)
GFR calc Af Amer: >60 mL/min (ref >60)
GFR calc non Af Amer: >60 mL/min (ref >60)
GLUCOSE: 131 mg/dL — AB (ref 70–99)
POTASSIUM: 4.4 mmol/L (ref 3.5–5.1)
SODIUM: 141 mmol/L (ref 135–145)

## 2017-09-01 MED ORDER — BUDESONIDE 0.25 MG/2ML IN SUSP
0.2500 mg | Freq: Two times a day (BID) | RESPIRATORY_TRACT | Status: DC
  Administered 2017-09-01 – 2017-09-02 (×3): 0.25 mg via RESPIRATORY_TRACT
  Filled 2017-09-01 (×3): qty 2

## 2017-09-01 NOTE — Progress Notes (Signed)
[place] at [place] NAME: Russell Howard    MR#:  [id number]  DATE OF BIRTH:  09-Jun-1934  SUBJECTIVE:  CHIEF COMPLAINT:   Chief Complaint  Patient presents with  . Weakness  . Shortness of Breath   Feels weak.  Poor appetite.  No pain. Afebrile  REVIEW OF SYSTEMS:    Review of Systems  Constitutional: Positive for chills and malaise/fatigue. Negative for fever.  HENT: Negative for sore throat.   Eyes: Negative for blurred vision, double vision and pain.  Respiratory: Negative for cough, hemoptysis, shortness of breath and wheezing.   Cardiovascular: Negative for chest pain, palpitations, orthopnea and leg swelling.  Gastrointestinal: Negative for abdominal pain, constipation, diarrhea, heartburn, nausea and vomiting.  Genitourinary: Negative for dysuria and hematuria.  Musculoskeletal: Negative for back pain and joint pain.  Skin: Negative for rash.  Neurological: Positive for dizziness. Negative for sensory change, speech change, focal weakness and headaches.  Endo/Heme/Allergies: Does not bruise/bleed easily.  Psychiatric/Behavioral: Negative for depression. The patient is not nervous/anxious.     DRUG ALLERGIES:   Allergies  Allergen Reactions  . Penicillin G Hives    Has patient had a PCN reaction causing immediate rash, facial/tongue/throat swelling, SOB or lightheadedness with hypotension: Yes Has patient had a PCN reaction causing severe rash involving mucus membranes or skin necrosis: No Has patient had a PCN reaction that required hospitalization No Has patient had a PCN reaction occurring within the last 10 years: No If all of the above answers are "NO", then may proceed with Cephalosporin use.  . Sulfa Antibiotics Rash    VITALS:  Blood pressure 113/62, pulse 92, temperature 97.9 F (36.6 C), temperature source Oral, resp. rate 18, height 5\' 10"  (1.778 m), weight 76.4 kg (168 lb 8 oz), SpO2 98 %.  PHYSICAL  EXAMINATION:   Physical Exam  GENERAL:  83 y.o.-year-old patient lying in the bed with no acute distress.  EYES: Pupils equal, round, reactive to light and accommodation. No scleral icterus. Extraocular muscles intact.  HEENT: Head atraumatic, normocephalic. Oropharynx and nasopharynx clear.  NECK:  Supple, no jugular venous distention. No thyroid enlargement, no tenderness.  LUNGS: Normal breath sounds bilaterally, no wheezing, rales, rhonchi. No use of accessory muscles of respiration.  CARDIOVASCULAR: S1, S2 normal. No murmurs, rubs, or gallops.  ABDOMEN: Soft, nontender, nondistended. Bowel sounds present. No organomegaly or mass.  EXTREMITIES: No cyanosis, clubbing or edema b/l.    NEUROLOGIC: Cranial nerves II through XII are intact. No focal Motor or sensory deficits b/l.   PSYCHIATRIC: The patient is alert and oriented x 3.  SKIN: No obvious rash, lesion, or ulcer.   LABORATORY PANEL:   CBC Recent Labs  Lab 09/01/17 0316  WBC 9.9  HGB 13.8  HCT 41.0  PLT 188   ------------------------------------------------------------------------------------------------------------------ Chemistries  Recent Labs  Lab 08/31/17 1605 09/01/17 0316  NA 140 141  K 4.5 4.4  CL 101 104  CO2 28 30  GLUCOSE 102* 131*  BUN 26* 25*  CREATININE 0.96 1.02  CALCIUM 9.6 8.9  AST 24  --   ALT 14  --   ALKPHOS 73  --   BILITOT 0.6  --    ------------------------------------------------------------------------------------------------------------------  Cardiac Enzymes Recent Labs  Lab 08/31/17 1605  TROPONINI <0.03   ------------------------------------------------------------------------------------------------------------------  RADIOLOGY:  Dg Chest Portable 1 View  Result Date: 08/31/2017 CLINICAL DATA:  Increasing shortness of Breath EXAM: PORTABLE CHEST 1 VIEW COMPARISON:  06/21/2017 FINDINGS: Cardiac shadow is  stable. Postsurgical changes are noted as well as defibrillator.  Chronic right pleural effusion and diffuse interstitial changes throughout the right lung with volume loss. No new focal abnormality is seen. IMPRESSION: Chronic changes on the right stable from the previous exam. Electronically Signed   By: Inez Catalina M.D.   On: 08/31/2017 16:10     ASSESSMENT AND PLAN:   Russell Howard  is a 83 y.o. male with a known history of CAD, COPD on 2l home o2, history of lung cancer status post chemoradiation, chronic diastolic heart failure, BPH requiring self-catheterization, history of A. Fib and PE on Eliquis presents to hospital secondary to worsening weakness, fevers and confusion  1. Acute cystitis, cathter related- patient self catheterizes at home due to BPH - IV Rocephin _ U cx pending - PT consult for weakness  2.    COPD with chronic respiratory failure.  Stable. Also has had a history of lung cancer status post chemo and radiation Continue oxygen  3.  Atrial fibrillation-paced rhythm, rate controlled.  Continue digoxin -On Eliquis for anticoagulation  4.  Chronic orthostasis- on solucortef and midodrine  5.  DVT prophylaxis-already on Eliquis  All the records are reviewed and case discussed with Care Management/Social Worker Management plans discussed with the patient, family and they are in agreement.  CODE STATUS: DNR  DVT Prophylaxis: SCDs  TOTAL TIME TAKING CARE OF THIS PATIENT: 35 minutes.   POSSIBLE D/C IN 1-2 DAYS, DEPENDING ON CLINICAL CONDITION.  Russell Howard M.D on 09/01/2017 at 9:19 AM  Between 7am to 6pm - Pager - [phone]  After 6pm go to [email or web address] - password [email or web address] [facility]  Office  [phone]  CC: Primary care physician; [name], MD  Note: This dictation was prepared with Dragon dictation along with smaller phrase technology. Any transcriptional errors that result from this process are unintentional.

## 2017-09-01 NOTE — Evaluation (Signed)
Physical Therapy Evaluation Patient Details Name: Russell Howard MRN: [id number] DOB: 15-Jun-1934 Today's Date: 09/01/2017   History of Present Illness  83 y.o. malewith recent weakness and confusion.  History of cardiomyopathy with EF of 45%, COPD on 2 L home oxygen, remote history of smoking, paroxysmal A. fib, hyperlipidemia, valvular heart disease, benign prostatic hypertrophy requiring self-catheterization twice a day.  Assessment includes: SOB with chronic PE, acute cystitis, A-fib, L-sided chest pain with elevated troponin due to demand ischemia, and chronic hypotension.    Clinical Impression  Pt not very interested in participating with PT exam secondary to feeling poorly, especially with dizziness (BP WNL post ambulation), he did have significant change in vitals with HR increasing to 120s and O2 dropping to 90% (on 2 liters) with modest amount of slow/labored ambulation of ~35 ft.  Pt reports he generally needs help at home and is refusing to consider STR despite more recent decrease in independence at home, will need HHPT if he is going to go home.     Follow Up Recommendations SNF(Pt refusing, states daughter-in-law is able to help her)    Equipment Recommendations       Recommendations for Other Services       Precautions / Restrictions Precautions Precautions: Fall Restrictions Weight Bearing Restrictions: No      Mobility  Bed Mobility Overal bed mobility: Modified Independent             General bed mobility comments: Pt needed rails to get to/from sitting but was able to do so w/o direct assist  Transfers Overall transfer level: Needs assistance Equipment used: Rolling walker (2 wheeled) Transfers: Sit to/from Stand Sit to Stand: Min assist;Min guard         General transfer comment: Pt struggled to get to standing w/o assist, lightly assist to get weight forward and for him to get to upright  Ambulation/Gait Ambulation/Gait assistance: Min assist Gait  Distance (Feet): 35 Feet Assistive device: Rolling walker (2 wheeled)       General Gait Details: Pt with slow, labored ambulation and considerable fatigue with the effort. His HR increased to 120 and O2 dropped to 90% (on 2 liters, 97% at rest pre ambulation)  Stairs            Wheelchair Mobility    Modified Rankin (Stroke Patients Only)       Balance Overall balance assessment: Needs assistance   Sitting balance-Leahy Scale: Fair Sitting balance - Comments: Pt with dizziness, did not tolerate sitting for more than 2 minutes on first attempt on getting up     Standing balance-Leahy Scale: Fair Standing balance comment: Highly reliant on the walker, poor tolerance                             Pertinent Vitals/Pain Pain Assessment: No/denies pain    Home Living Family/patient expects to be discharged to:: Private residence Living Arrangements: Children Available Help at Discharge: Family;Available 24 hours/day Type of Home: House Home Access: Ramped entrance     Home Layout: Two level;Able to live on main level with bedroom/bathroom Home Equipment: Wheelchair - [employer] - 4 wheels;Shower seat;Toilet riser      Prior Function Level of Independence: Needs assistance   Gait / Transfers Assistance Needed: Limited household ambulator with rollator with SBA from family for safety, no fall history  ADL's / Homemaking Assistance Needed: Requires assist for bathing and dressing from daughter in law, daughter in  law provides set up for self-cath (pt does 2x/day) and manages medications, meals, cleaning  Comments: Pt chronically feels very fatigued/short of breath with minimal exertion.  Uses urinal dtr-in-law walks with him to the commode for bowel movement     Hand Dominance        Extremity/Trunk Assessment   Upper Extremity Assessment Upper Extremity Assessment: Generalized weakness    Lower Extremity Assessment Lower Extremity Assessment:  Generalized weakness       Communication   Communication: No difficulties  Cognition Arousal/Alertness: Lethargic Behavior During Therapy: Flat affect Overall Cognitive Status: Difficult to assess                                 General Comments: Pt did know that it was late July and the year, generally was very low energy/motivation and struggled to stay engaged with PT questioning.  C/o dizziness t/o the session (in any position)      General Comments      Exercises     Assessment/Plan    PT Assessment Patient needs continued PT services  PT Problem List Decreased strength;Decreased range of motion;Decreased activity tolerance;Decreased balance;Decreased mobility;Decreased coordination;Decreased cognition;Decreased safety awareness;Decreased knowledge of use of DME       PT Treatment Interventions DME instruction;Gait training;Stair training;Functional mobility training;Therapeutic activities;Therapeutic exercise;Balance training;Neuromuscular re-education;Patient/family education    PT Goals (Current goals can be found in the Care Plan section)  Acute Rehab PT Goals Patient Stated Goal: go home PT Goal Formulation: With patient Time For Goal Achievement: 09/15/17 Potential to Achieve Goals: Fair    Frequency Min 2X/week   Barriers to discharge        Co-evaluation               AM-PAC PT "6 Clicks" Daily Activity  Outcome Measure Difficulty turning over in bed (including adjusting bedclothes, sheets and blankets)?: A Little Difficulty moving from lying on back to sitting on the side of the bed? : A Little Difficulty sitting down on and standing up from a chair with arms (e.g., wheelchair, bedside commode, etc,.)?: A Little Help needed moving to and from a bed to chair (including a wheelchair)?: A Little Help needed walking in hospital room?: A Lot Help needed climbing 3-5 steps with a railing? : A Lot 6 Click Score: 16    End of Session  Equipment Utilized During Treatment: Gait belt;Oxygen(2 liters) Activity Tolerance: Patient limited by fatigue;Patient limited by lethargy Patient left: with bed alarm set;with call bell/phone within reach Nurse Communication: Mobility status PT Visit Diagnosis: Muscle weakness (generalized) (M62.81);Difficulty in walking, not elsewhere classified (R26.2);Dizziness and giddiness (R42)    Time: 6659-9357 PT Time Calculation (min) (ACUTE ONLY): 22 min   Charges:   PT Evaluation $PT Eval Low Complexity: 1 Low          Kreg Shropshire, DPT 09/01/2017, 1:07 PM

## 2017-09-01 NOTE — Clinical Social Work Note (Signed)
CSW notified by PT that patient need SNF placement. CSW met with patient and he is refusing to go to SNF. Patient is open to going home with home health. RN CM made aware. CSW signing off, please re consult if further needs arise.   [place], [place]

## 2017-09-02 LAB — URINE CULTURE

## 2017-09-02 LAB — DIGOXIN LEVEL: DIGOXIN LVL: 1.9 ng/mL (ref 0.8–2.0)

## 2017-09-02 MED ORDER — CEPHALEXIN 500 MG PO CAPS: 500.0000 mg | ORAL_CAPSULE | Freq: Three times a day (TID) | ORAL | 0 refills | Status: AC

## 2017-09-02 MED ORDER — APIXABAN 5 MG PO TABS: 5.0000 mg | ORAL_TABLET | Freq: Two times a day (BID) | ORAL | 2 refills | Status: AC

## 2017-09-02 NOTE — Care Management (Signed)
Discharge to home today per Dr. Tressia Miners. [place] representative updated. Will be followed by [facility] in the home. Family will transport Shelbie Ammons RN MSN CCM Care Management [phone]

## 2017-09-02 NOTE — Discharge Summary (Signed)
[facility] NAME: Russell Howard    MR#:  [id number]  DATE OF BIRTH:  1934/07/26  DATE OF ADMISSION:  08/31/2017   ADMITTING PHYSICIAN: Gladstone Lighter, MD  DATE OF DISCHARGE: 09/02/2017 12:35 PM  PRIMARY CARE PHYSICIAN: [name], MD   ADMISSION DIAGNOSIS:   Generalized weakness [R53.1] Urinary tract infection without hematuria, site unspecified [N39.0]  DISCHARGE DIAGNOSIS:   Active Problems:   Altered mental status   SECONDARY DIAGNOSIS:   Past Medical History:  Diagnosis Date  . Acquired hypothyroidism 11/02/2014  . BPH (benign prostatic hyperplasia)   . Cardiac defibrillator in place 11/02/2014  . Cardiomyopathy ([place])   . CHF (congestive heart failure) ([place])   . Chronic diastolic heart failure ([place]) 11/02/2014  . COPD (chronic obstructive pulmonary disease) ([place])   . Gastro-esophageal reflux disease without esophagitis 11/02/2014  . H/O malignant neoplasm 11/09/2014  . Heart valve disease 11/02/2014  . History of atrial fibrillation 11/02/2014  . Lung cancer ([place])   . Neuropathy 11/02/2014  . Orthostasis 11/02/2014  . Pure hypercholesterolemia 11/02/2014  . Valvular heart disease     HOSPITAL COURSE:   Russell Howard  is a 83 y.o. male with a known history of CAD, COPD on 2l home o2, history of lung cancer status post chemoradiation, chronic diastolic heart failure, BPH requiring self-catheterization, history of A. Fib and PE on Eliquis presents to hospital secondary to worsening weakness, fevers and confusion  1. Acute cystitis- patient self catheterizes at home due to BPH -Urine cultures here growing and significant growth.  Patient responded well with Rocephin -Being discharged on oral Keflex - PT consult for weakness-patient refused rehab and is being discharged home  2.  History of pulmonary embolism-on Eliquis.  Diagnosed in April 2019. -Has chronic shortness of breath.  On home oxygen 2 L.  Also has  had a history of lung cancer status post chemo and radiation  3.  Atrial fibrillation-paced rhythm, rate controlled.  Continue digoxin -On Eliquis for anticoagulation  4.  Chronic orthostasis- on solucortef and midodrine  5.  Generalized weakness-also failure to thrive with gradual oral intake declining. -Palliative care follow-up  PT consulted-patient did not seem to participate well with therapy.  They have recommended rehab.  However patient refused and family taking him home.  We will set up home health. -Recommend palliative care follow-up     DISCHARGE CONDITIONS:   Guarded  CONSULTS OBTAINED:   None  DRUG ALLERGIES:   Allergies  Allergen Reactions  . Penicillin G Hives    Has patient had a PCN reaction causing immediate rash, facial/tongue/throat swelling, SOB or lightheadedness with hypotension: Yes Has patient had a PCN reaction causing severe rash involving mucus membranes or skin necrosis: No Has patient had a PCN reaction that required hospitalization No Has patient had a PCN reaction occurring within the last 10 years: No If all of the above answers are "NO", then may proceed with Cephalosporin use.  . Sulfa Antibiotics Rash   DISCHARGE MEDICATIONS:   Allergies as of 09/02/2017      Reactions   Penicillin G Hives   Has patient had a PCN reaction causing immediate rash, facial/tongue/throat swelling, SOB or lightheadedness with hypotension: Yes Has patient had a PCN reaction causing severe rash involving mucus membranes or skin necrosis: No Has patient had a PCN reaction that required hospitalization No Has patient had a PCN reaction occurring within the last 10 years: No If all  of the above answers are "NO", then may proceed with Cephalosporin use.   Sulfa Antibiotics Rash      Medication List    TAKE these medications   acidophilus Caps capsule Take 1 capsule by mouth every evening.   apixaban 5 MG Tabs tablet Commonly known as:  ELIQUIS Take  1 tablet (5 mg total) by mouth 2 (two) times daily.   aspirin EC 81 MG tablet Take 81 mg by mouth daily.   budesonide 0.25 MG/2ML nebulizer solution Commonly known as:  PULMICORT Take 2 mLs (0.25 mg total) by nebulization 2 (two) times daily.   cephALEXin 500 MG capsule Commonly known as:  KEFLEX Take 1 capsule (500 mg total) by mouth 3 (three) times daily for 7 days.   Cholecalciferol 5000 units Tabs Take 5,000 Units by mouth daily.   Co Q-10 120 MG Caps Take 120 mg by mouth every evening.   digoxin 0.25 MG tablet Commonly known as:  LANOXIN Take 0.25 mg by mouth daily.   fluticasone 50 MCG/ACT nasal spray Commonly known as:  FLONASE Place 2 sprays into both nostrils daily.   furosemide 20 MG tablet Commonly known as:  LASIX Take 20 mg by mouth every other day as needed for fluid.   gabapentin 600 MG tablet Commonly known as:  NEURONTIN Take 600 mg by mouth 2 (two) times daily.   hydrocortisone 10 MG tablet Commonly known as:  CORTEF Take 10 mg by mouth 2 (two) times daily.   ipratropium-albuterol 0.5-2.5 (3) MG/3ML Soln Commonly known as:  DUONEB Take 3 mLs by nebulization every 4 (four) hours as needed.   levothyroxine 100 MCG tablet Commonly known as:  SYNTHROID, LEVOTHROID Take 100 mcg by mouth daily before breakfast.   magnesium oxide 400 (241.3 Mg) MG tablet Commonly known as:  MAG-OX Take 400 mg by mouth every evening.   methenamine 1 g tablet Commonly known as:  HIPREX TAKE 1 TABLET BY MOUTH TWICE DAILY WITH MEALS   midodrine 10 MG tablet Commonly known as:  PROAMATINE Take 1 tablet (10 mg total) by mouth 3 (three) times daily as needed. If systolic BP <518   multivitamin with minerals Tabs tablet Take 1 tablet by mouth daily.   pantoprazole 40 MG tablet Commonly known as:  PROTONIX Take 40 mg by mouth daily before breakfast.   potassium chloride 10 MEQ tablet Commonly known as:  K-DUR Take 10 mEq by mouth daily as needed (for fluid  retention when taking furosemide).   pyridostigmine 60 MG tablet Commonly known as:  MESTINON Take 60 mg by mouth 2 (two) times daily.   saw palmetto 500 MG capsule Take 1,000 mg by mouth every evening.   simvastatin 20 MG tablet Commonly known as:  ZOCOR Take 20 mg by mouth every evening.   vitamin B-12 500 MCG tablet Commonly known as:  CYANOCOBALAMIN Take 500 mcg by mouth daily.   vitamin C 1000 MG tablet Take 500 mg by mouth 2 (two) times daily.   vitamin E 400 UNIT capsule Take 400 Units by mouth every evening.        DISCHARGE INSTRUCTIONS:   1. PCP f/u in 1-2 weeks 2. Palliative care f/u with home health  DIET:   Regular diet  ACTIVITY:   Activity as tolerated  OXYGEN:   Home Oxygen: Yes.    Oxygen Delivery: 2 liters/min via Patient connected to nasal cannula oxygen  DISCHARGE LOCATION:   home   If you experience worsening of your admission  symptoms, develop shortness of breath, life threatening emergency, suicidal or homicidal thoughts you must seek medical attention immediately by calling 911 or calling your MD immediately  if symptoms less severe.  You Must read complete instructions/literature along with all the possible adverse reactions/side effects for all the Medicines you take and that have been prescribed to you. Take any new Medicines after you have completely understood and accpet all the possible adverse reactions/side effects.   Please note  You were cared for by a hospitalist during your hospital stay. If you have any questions about your discharge medications or the care you received while you were in the hospital after you are discharged, you can call the unit and asked to speak with the hospitalist on call if the hospitalist that took care of you is not available. Once you are discharged, your primary care physician will handle any further medical issues. Please note that NO REFILLS for any discharge medications will be authorized once  you are discharged, as it is imperative that you return to your primary care physician (or establish a relationship with a primary care physician if you do not have one) for your aftercare needs so that they can reassess your need for medications and monitor your lab values.    On the day of Discharge:  VITAL SIGNS:   Blood pressure (!) 96/51, pulse 90, temperature 97.7 F (36.5 C), temperature source Oral, resp. rate (!) 22, height 5\' 10"  (1.778 m), weight 76.4 kg (168 lb 8 oz), SpO2 99 %.  PHYSICAL EXAMINATION:    GENERAL:  83 y.o.-year-old elderly patient lying in the bed with no acute distress. Weak and frail appearing EYES: Pupils equal, round, reactive to light and accommodation. No scleral icterus. Extraocular muscles intact.  HEENT: Head atraumatic, normocephalic. Oropharynx and nasopharynx clear.  NECK:  Supple, no jugular venous distention. No thyroid enlargement, no tenderness.  LUNGS: Normal breath sounds bilaterally, no wheezing, rales,rhonchi or crepitation. No use of accessory muscles of respiration.  CARDIOVASCULAR: S1, S2 normal. No  rubs, or gallops.  ABDOMEN: Soft, nontender, nondistended. Bowel sounds present. No organomegaly or mass.  EXTREMITIES: No pedal edema, cyanosis, or clubbing. 3/6 systolic murmur present, s/p AICD/pacemaker left chest NEUROLOGIC: Cranial nerves II through XII are intact. Muscle strength 5/5 in all extremities. Sensation intact. Gait not checked.  PSYCHIATRIC: The patient is alert and oriented x 3. Some intermittent confusion noted. SKIN: No obvious rash, lesion, or ulcer.     DATA REVIEW:   CBC Recent Labs  Lab 09/01/17 0316  WBC 9.9  HGB 13.8  HCT 41.0  PLT 188    Chemistries  Recent Labs  Lab 08/31/17 1605 09/01/17 0316  NA 140 141  K 4.5 4.4  CL 101 104  CO2 28 30  GLUCOSE 102* 131*  BUN 26* 25*  CREATININE 0.96 1.02  CALCIUM 9.6 8.9  AST 24  --   ALT 14  --   ALKPHOS 73  --   BILITOT 0.6  --       Microbiology Results  Results for orders placed or performed during the hospital encounter of 08/31/17  Urine Culture     Status: Abnormal   Collection Time: 08/31/17  5:16 PM  Result Value Ref Range Status   Specimen Description   Final    URINE, RANDOM Performed at [facility], [address] [place], [place] [address]    Special Requests   Final    NONE Performed at [facility], [place]  [address] [place], [place] [address]    Culture (A)  Final    <10,000 COLONIES/mL INSIGNIFICANT GROWTH Performed at [facility] [address], [place], [place] [address]    Report Status 09/02/2017 FINAL  Final  MRSA PCR Screening     Status: None   Collection Time: 08/31/17  9:43 PM  Result Value Ref Range Status   MRSA by PCR NEGATIVE NEGATIVE Final    Comment:        The GeneXpert MRSA Assay (FDA approved for NASAL specimens only), is one component of a comprehensive MRSA colonization surveillance program. It is not intended to diagnose MRSA infection nor to guide or monitor treatment for MRSA infections. Performed at [facility], [address], [place], [place] [address]     RADIOLOGY:  No results found.   Management plans discussed with the patient, family and they are in agreement.  CODE STATUS:  Code Status History    Date Active Date Inactive Code Status Order ID Comments User Context   08/31/2017 2104 09/02/2017 1540 DNR [id number]  Gladstone Lighter, MD Inpatient   06/21/2017 1420 06/24/2017 1512 DNR [id number]  Dustin Flock, MD Inpatient   05/22/2017 1609 05/29/2017 1740 DNR [id number]  Gladstone Lighter, MD Inpatient   11/17/2016 1631 11/20/2016 1602 DNR [id number]  Vaughan Basta, MD Inpatient   08/19/2016 1315 08/30/2016 1728 DNR [id number]  Harrie Foreman, MD Inpatient   03/02/2016 2236 03/05/2016 2020 DNR [id number]  Mikael Spray, NP ED   01/09/2016 0658 01/14/2016 1939 DNR [id number]  Harrie Foreman, MD  Inpatient   11/09/2015 0318 11/12/2015 1713 DNR [id number]  Harrie Foreman, MD Inpatient   06/14/2015 1639 06/18/2015 1513 DNR [id number]  Demetrios Loll, MD Inpatient    Questions for Most Recent Historical Code Status (Order [id number])    Question Answer Comment   In the event of cardiac or respiratory ARREST Do not call a "code blue"    In the event of cardiac or respiratory ARREST Do not perform Intubation, CPR, defibrillation or ACLS    In the event of cardiac or respiratory ARREST Use medication by any route, position, wound care, and other measures to relive pain and suffering. May use oxygen, suction and manual treatment of airway obstruction as needed for comfort.         Advance Directive Documentation     Most Recent Value  Type of Advance Directive  Healthcare Power of [place], Out of facility DNR (pink MOST or yellow form), Living will  Pre-existing out of facility DNR order (yellow form or pink MOST form)  Yellow form placed in chart (order not valid for inpatient use)  "MOST" Form in Place?  -      TOTAL TIME TAKING CARE OF THIS PATIENT: 38 minutes.    Gladstone Lighter M.D on 09/02/2017 at 3:55 PM  Between 7am to 6pm - Pager - [phone]  After 6pm go to [email or web address] - Proofreader  [facility]  Office  [phone]  CC: Primary care physician; [name], MD   Note: This dictation was prepared with Dragon dictation along with smaller phrase technology. Any transcriptional errors that result from this process are unintentional.

## 2017-09-02 NOTE — Progress Notes (Signed)
Esmond Harps., RN completed discharge. Madlyn Frankel, RN

## 2017-09-02 NOTE — Progress Notes (Signed)
Please note patient is currently followed by outpatient Palliative at home. [place] made aware. Palliative team updated. Flo Shanks RN, BSN, [place] and [facility], hospital Liaison [phone]

## 2017-09-10 ENCOUNTER — Other Ambulatory Visit: Payer: Self-pay | Admitting: Urology

## 2017-09-10 DIAGNOSIS — N390 Urinary tract infection, site not specified: Secondary | ICD-10-CM

## 2017-10-10 ENCOUNTER — Other Ambulatory Visit: Payer: Self-pay | Admitting: Urology

## 2017-10-10 DIAGNOSIS — N390 Urinary tract infection, site not specified: Secondary | ICD-10-CM

## 2017-10-17 ENCOUNTER — Other Ambulatory Visit: Payer: Self-pay

## 2017-10-17 ENCOUNTER — Emergency Department: Payer: Medicare Other

## 2017-10-17 ENCOUNTER — Inpatient Hospital Stay
Admission: EM | Admit: 2017-10-17 | Discharge: 2017-10-20 | DRG: 699 | Disposition: A | Discharge status: Home Health | Payer: Medicare Other | Attending: Internal Medicine | Admitting: Internal Medicine

## 2017-10-17 ENCOUNTER — Encounter: Payer: Self-pay | Admitting: Emergency Medicine

## 2017-10-17 DIAGNOSIS — Y846 Urinary catheterization as the cause of abnormal reaction of the patient, or of later complication, without mention of misadventure at the time of the procedure: Secondary | ICD-10-CM | POA: Diagnosis present

## 2017-10-17 DIAGNOSIS — E860 Dehydration: Secondary | ICD-10-CM | POA: Diagnosis present

## 2017-10-17 DIAGNOSIS — I480 Paroxysmal atrial fibrillation: Secondary | ICD-10-CM | POA: Diagnosis present

## 2017-10-17 DIAGNOSIS — E039 Hypothyroidism, unspecified: Secondary | ICD-10-CM | POA: Diagnosis present

## 2017-10-17 DIAGNOSIS — G629 Polyneuropathy, unspecified: Secondary | ICD-10-CM | POA: Diagnosis present

## 2017-10-17 DIAGNOSIS — Z7901 Long term (current) use of anticoagulants: Secondary | ICD-10-CM | POA: Diagnosis not present

## 2017-10-17 DIAGNOSIS — Z66 Do not resuscitate: Secondary | ICD-10-CM | POA: Diagnosis present

## 2017-10-17 DIAGNOSIS — N390 Urinary tract infection, site not specified: Secondary | ICD-10-CM | POA: Diagnosis present

## 2017-10-17 DIAGNOSIS — Z952 Presence of prosthetic heart valve: Secondary | ICD-10-CM

## 2017-10-17 DIAGNOSIS — I5032 Chronic diastolic (congestive) heart failure: Secondary | ICD-10-CM | POA: Diagnosis present

## 2017-10-17 DIAGNOSIS — E7800 Pure hypercholesterolemia, unspecified: Secondary | ICD-10-CM | POA: Diagnosis present

## 2017-10-17 DIAGNOSIS — Z7982 Long term (current) use of aspirin: Secondary | ICD-10-CM

## 2017-10-17 DIAGNOSIS — B401 Chronic pulmonary blastomycosis: Secondary | ICD-10-CM | POA: Diagnosis present

## 2017-10-17 DIAGNOSIS — R339 Retention of urine, unspecified: Secondary | ICD-10-CM | POA: Diagnosis present

## 2017-10-17 DIAGNOSIS — Z882 Allergy status to sulfonamides status: Secondary | ICD-10-CM

## 2017-10-17 DIAGNOSIS — Z7952 Long term (current) use of systemic steroids: Secondary | ICD-10-CM

## 2017-10-17 DIAGNOSIS — Z87891 Personal history of nicotine dependence: Secondary | ICD-10-CM

## 2017-10-17 DIAGNOSIS — I959 Hypotension, unspecified: Secondary | ICD-10-CM | POA: Diagnosis present

## 2017-10-17 DIAGNOSIS — N179 Acute kidney failure, unspecified: Secondary | ICD-10-CM | POA: Diagnosis present

## 2017-10-17 DIAGNOSIS — I429 Cardiomyopathy, unspecified: Secondary | ICD-10-CM | POA: Diagnosis present

## 2017-10-17 DIAGNOSIS — Z95810 Presence of automatic (implantable) cardiac defibrillator: Secondary | ICD-10-CM

## 2017-10-17 DIAGNOSIS — Z880 Allergy status to penicillin: Secondary | ICD-10-CM

## 2017-10-17 DIAGNOSIS — Z95828 Presence of other vascular implants and grafts: Secondary | ICD-10-CM | POA: Diagnosis not present

## 2017-10-17 DIAGNOSIS — Z7951 Long term (current) use of inhaled steroids: Secondary | ICD-10-CM

## 2017-10-17 DIAGNOSIS — Z85118 Personal history of other malignant neoplasm of bronchus and lung: Secondary | ICD-10-CM | POA: Diagnosis not present

## 2017-10-17 DIAGNOSIS — J449 Chronic obstructive pulmonary disease, unspecified: Secondary | ICD-10-CM | POA: Diagnosis present

## 2017-10-17 DIAGNOSIS — T83518A Infection and inflammatory reaction due to other urinary catheter, initial encounter: Secondary | ICD-10-CM | POA: Diagnosis present

## 2017-10-17 DIAGNOSIS — Y732 Prosthetic and other implants, materials and accessory gastroenterology and urology devices associated with adverse incidents: Secondary | ICD-10-CM | POA: Diagnosis present

## 2017-10-17 DIAGNOSIS — K219 Gastro-esophageal reflux disease without esophagitis: Secondary | ICD-10-CM | POA: Diagnosis present

## 2017-10-17 DIAGNOSIS — Z79899 Other long term (current) drug therapy: Secondary | ICD-10-CM

## 2017-10-17 DIAGNOSIS — Z792 Long term (current) use of antibiotics: Secondary | ICD-10-CM

## 2017-10-17 LAB — COMPREHENSIVE METABOLIC PANEL
ALBUMIN: 4.1 g/dL (ref 3.5–5.0)
ALK PHOS: 82 U/L (ref 38–126)
ALT: 17 U/L (ref 0–44)
AST: 31 U/L (ref 15–41)
Anion gap: 15 (ref 5–15)
BUN: 23 mg/dL (ref 8–23)
CALCIUM: 9.7 mg/dL (ref 8.9–10.3)
CHLORIDE: 102 mmol/L (ref 98–111)
CO2: 23 mmol/L (ref 22–32)
CREATININE: 1.29 mg/dL — AB (ref 0.61–1.24)
GFR calc non Af Amer: 51 mL/min — ABNORMAL LOW (ref >60)
GFR, EST AFRICAN AMERICAN: 59 mL/min — AB (ref >60)
GLUCOSE: 157 mg/dL — AB (ref 70–99)
Potassium: 4.2 mmol/L (ref 3.5–5.1)
SODIUM: 140 mmol/L (ref 135–145)
Total Bilirubin: 0.6 mg/dL (ref 0.3–1.2)
Total Protein: 8.3 g/dL — ABNORMAL HIGH (ref 6.5–8.1)

## 2017-10-17 LAB — CBC
HCT: 46.8 % (ref 40.0–52.0)
Hemoglobin: 15.8 g/dL (ref 13.0–18.0)
MCH: 30.7 pg (ref 26.0–34.0)
MCHC: 33.9 g/dL (ref 32.0–36.0)
MCV: 90.8 fL (ref 80.0–100.0)
PLATELETS: 279 10*3/uL (ref 150–440)
RBC: 5.15 MIL/uL (ref 4.40–5.90)
RDW: 15.5 % — ABNORMAL HIGH (ref 11.5–14.5)
WBC: 15.5 10*3/uL — ABNORMAL HIGH (ref 3.8–10.6)

## 2017-10-17 LAB — URINALYSIS, COMPLETE (UACMP) WITH MICROSCOPIC
BILIRUBIN URINE: NEGATIVE
Bacteria, UA: NONE SEEN
Glucose, UA: NEGATIVE mg/dL
Hgb urine dipstick: NEGATIVE
KETONES UR: NEGATIVE mg/dL
Nitrite: NEGATIVE
PH: 5 (ref 5.0–8.0)
Protein, ur: 30 mg/dL — AB
SPECIFIC GRAVITY, URINE: 1.019 (ref 1.005–1.030)

## 2017-10-17 LAB — TROPONIN I

## 2017-10-17 LAB — LIPASE, BLOOD: LIPASE: 28 U/L (ref 11–51)

## 2017-10-17 MED ORDER — APIXABAN 5 MG PO TABS
5.0000 mg | ORAL_TABLET | Freq: Two times a day (BID) | ORAL | Status: DC
  Administered 2017-10-17 – 2017-10-20 (×6): 5 mg via ORAL
  Filled 2017-10-17 (×6): qty 1

## 2017-10-17 MED ORDER — VITAMIN C 500 MG PO TABS
500.0000 mg | ORAL_TABLET | Freq: Two times a day (BID) | ORAL | Status: DC
  Administered 2017-10-17 – 2017-10-20 (×6): 500 mg via ORAL
  Filled 2017-10-17 (×7): qty 1

## 2017-10-17 MED ORDER — ONDANSETRON HCL 4 MG PO TABS: 4.0000 mg | ORAL_TABLET | Freq: Four times a day (QID) | ORAL | Status: DC | PRN

## 2017-10-17 MED ORDER — MIDODRINE HCL 5 MG PO TABS
10.0000 mg | ORAL_TABLET | Freq: Three times a day (TID) | ORAL | Status: DC | PRN
  Administered 2017-10-19 – 2017-10-20 (×3): 10 mg via ORAL
  Filled 2017-10-17 (×4): qty 2

## 2017-10-17 MED ORDER — SODIUM CHLORIDE 0.9 % IV SOLN
Freq: Once | INTRAVENOUS | Status: AC
  Administered 2017-10-17: via INTRAVENOUS

## 2017-10-17 MED ORDER — MAGNESIUM OXIDE 400 (241.3 MG) MG PO TABS
400.0000 mg | ORAL_TABLET | Freq: Every evening | ORAL | Status: DC
  Administered 2017-10-17 – 2017-10-19 (×3): 400 mg via ORAL
  Filled 2017-10-17 (×3): qty 1

## 2017-10-17 MED ORDER — ACETAMINOPHEN 325 MG PO TABS: 650.0000 mg | ORAL_TABLET | Freq: Four times a day (QID) | ORAL | Status: DC | PRN

## 2017-10-17 MED ORDER — ACETAMINOPHEN 650 MG RE SUPP: 650.0000 mg | Freq: Four times a day (QID) | RECTAL | Status: DC | PRN

## 2017-10-17 MED ORDER — SIMVASTATIN 20 MG PO TABS
20.0000 mg | ORAL_TABLET | Freq: Every evening | ORAL | Status: DC
  Administered 2017-10-17 – 2017-10-19 (×3): 20 mg via ORAL
  Filled 2017-10-17 (×3): qty 1

## 2017-10-17 MED ORDER — DIGOXIN 250 MCG PO TABS
0.2500 mg | ORAL_TABLET | Freq: Every day | ORAL | Status: DC
  Administered 2017-10-18 – 2017-10-20 (×3): 0.25 mg via ORAL
  Filled 2017-10-17 (×3): qty 1

## 2017-10-17 MED ORDER — SODIUM CHLORIDE 0.9 % IV SOLN
Freq: Once | INTRAVENOUS | Status: AC
  Administered 2017-10-17: 21:00:00 via INTRAVENOUS

## 2017-10-17 MED ORDER — SODIUM CHLORIDE 0.9 % IV SOLN
1.0000 g | Freq: Once | INTRAVENOUS | Status: AC
  Administered 2017-10-17: 1 g via INTRAVENOUS
  Filled 2017-10-17: qty 10

## 2017-10-17 MED ORDER — HYDROCORTISONE 10 MG PO TABS
10.0000 mg | ORAL_TABLET | Freq: Two times a day (BID) | ORAL | Status: DC
  Administered 2017-10-17 – 2017-10-20 (×6): 10 mg via ORAL
  Filled 2017-10-17 (×8): qty 1

## 2017-10-17 MED ORDER — ADULT MULTIVITAMIN W/MINERALS CH
1.0000 | ORAL_TABLET | Freq: Every day | ORAL | Status: DC
  Administered 2017-10-18 – 2017-10-20 (×3): 1 via ORAL
  Filled 2017-10-17 (×3): qty 1

## 2017-10-17 MED ORDER — ONDANSETRON HCL 4 MG/2ML IJ SOLN
INTRAMUSCULAR | Status: AC
  Administered 2017-10-17: 4 mg
  Filled 2017-10-17: qty 2

## 2017-10-17 MED ORDER — IPRATROPIUM-ALBUTEROL 0.5-2.5 (3) MG/3ML IN SOLN
3.0000 mL | RESPIRATORY_TRACT | Status: DC | PRN
  Filled 2017-10-17: qty 3

## 2017-10-17 MED ORDER — VITAMIN B-12 1000 MCG PO TABS
500.0000 ug | ORAL_TABLET | Freq: Every day | ORAL | Status: DC
  Administered 2017-10-18 – 2017-10-20 (×3): 500 ug via ORAL
  Filled 2017-10-17 (×3): qty 1

## 2017-10-17 MED ORDER — CO Q-10 120 MG PO CAPS: 120.0000 mg | ORAL_CAPSULE | Freq: Every evening | ORAL | Status: DC

## 2017-10-17 MED ORDER — VITAMIN E 180 MG (400 UNIT) PO CAPS
400.0000 [IU] | ORAL_CAPSULE | Freq: Every evening | ORAL | Status: DC
  Administered 2017-10-18 – 2017-10-19 (×2): 400 [IU] via ORAL
  Filled 2017-10-17 (×3): qty 1

## 2017-10-17 MED ORDER — RISAQUAD PO CAPS
1.0000 | ORAL_CAPSULE | Freq: Every evening | ORAL | Status: DC
  Administered 2017-10-17 – 2017-10-19 (×3): 1 via ORAL
  Filled 2017-10-17 (×3): qty 1

## 2017-10-17 MED ORDER — POTASSIUM CHLORIDE CRYS ER 10 MEQ PO TBCR: 10.0000 meq | EXTENDED_RELEASE_TABLET | Freq: Every day | ORAL | Status: DC | PRN

## 2017-10-17 MED ORDER — BISACODYL 5 MG PO TBEC: 5.0000 mg | DELAYED_RELEASE_TABLET | Freq: Every day | ORAL | Status: DC | PRN

## 2017-10-17 MED ORDER — GI COCKTAIL ~~LOC~~
30.0000 mL | Freq: Once | ORAL | Status: AC
  Administered 2017-10-17: 30 mL via ORAL
  Filled 2017-10-17: qty 30

## 2017-10-17 MED ORDER — FLUTICASONE PROPIONATE 50 MCG/ACT NA SUSP
2.0000 | Freq: Every day | NASAL | Status: DC
  Filled 2017-10-17: qty 16

## 2017-10-17 MED ORDER — ASPIRIN EC 81 MG PO TBEC
81.0000 mg | DELAYED_RELEASE_TABLET | Freq: Every day | ORAL | Status: DC
  Administered 2017-10-18 – 2017-10-20 (×3): 81 mg via ORAL
  Filled 2017-10-17 (×3): qty 1

## 2017-10-17 MED ORDER — SODIUM CHLORIDE 0.9 % IV SOLN
1.0000 g | INTRAVENOUS | Status: DC
  Administered 2017-10-18: 1 g via INTRAVENOUS
  Filled 2017-10-17: qty 10
  Filled 2017-10-17: qty 1

## 2017-10-17 MED ORDER — HYDROCODONE-ACETAMINOPHEN 5-325 MG PO TABS: 1.0000 | ORAL_TABLET | ORAL | Status: DC | PRN

## 2017-10-17 MED ORDER — FUROSEMIDE 20 MG PO TABS: 20.0000 mg | ORAL_TABLET | ORAL | Status: DC | PRN

## 2017-10-17 MED ORDER — VITAMIN D 1000 UNITS PO TABS
5000.0000 [IU] | ORAL_TABLET | Freq: Every day | ORAL | Status: DC
  Administered 2017-10-18 – 2017-10-20 (×3): 5000 [IU] via ORAL
  Filled 2017-10-17 (×3): qty 5

## 2017-10-17 MED ORDER — ONDANSETRON HCL 4 MG/2ML IJ SOLN: 4.0000 mg | Freq: Four times a day (QID) | INTRAMUSCULAR | Status: DC | PRN

## 2017-10-17 MED ORDER — DOCUSATE SODIUM 100 MG PO CAPS
100.0000 mg | ORAL_CAPSULE | Freq: Two times a day (BID) | ORAL | Status: DC
  Administered 2017-10-18 – 2017-10-20 (×3): 100 mg via ORAL
  Filled 2017-10-17 (×5): qty 1

## 2017-10-17 MED ORDER — PANTOPRAZOLE SODIUM 40 MG PO TBEC
40.0000 mg | DELAYED_RELEASE_TABLET | Freq: Every day | ORAL | Status: DC
  Administered 2017-10-18 – 2017-10-20 (×3): 40 mg via ORAL
  Filled 2017-10-17 (×3): qty 1

## 2017-10-17 MED ORDER — SAW PALMETTO (SERENOA REPENS) 500 MG PO CAPS: 1000.0000 mg | ORAL_CAPSULE | Freq: Every evening | ORAL | Status: DC

## 2017-10-17 MED ORDER — GABAPENTIN 600 MG PO TABS
600.0000 mg | ORAL_TABLET | Freq: Two times a day (BID) | ORAL | Status: DC
  Administered 2017-10-17 – 2017-10-20 (×6): 600 mg via ORAL
  Filled 2017-10-17 (×6): qty 1

## 2017-10-17 MED ORDER — TRAZODONE HCL 50 MG PO TABS: 25.0000 mg | ORAL_TABLET | Freq: Every evening | ORAL | Status: DC | PRN

## 2017-10-17 MED ORDER — LEVOTHYROXINE SODIUM 100 MCG PO TABS
100.0000 ug | ORAL_TABLET | Freq: Every day | ORAL | Status: DC
  Administered 2017-10-18 – 2017-10-20 (×3): 100 ug via ORAL
  Filled 2017-10-17 (×3): qty 1

## 2017-10-17 MED ORDER — BUDESONIDE 0.25 MG/2ML IN SUSP
0.2500 mg | Freq: Two times a day (BID) | RESPIRATORY_TRACT | Status: DC
  Filled 2017-10-17 (×2): qty 2

## 2017-10-17 NOTE — H&P (Signed)
[facility] [place] NAME: Russell Howard    MR#:  [id number]  DATE OF BIRTH:  08/20/1934  DATE OF ADMISSION:  10/17/2017  PRIMARY CARE PHYSICIAN: [name], MD   REQUESTING/REFERRING PHYSICIAN:   CHIEF COMPLAINT:   Chief Complaint  Patient presents with  . Emesis    HISTORY OF PRESENT ILLNESS: Russell Howard  is a 83 y.o. male with a known history of BPH, cardiomyopathy, CHF, diastolic heart failure, COPD, GERD, atrial fibrillation and recurrent urinary tract infections.  Patient was diagnosed with urinary retention approximately 2 years ago and he has been doing straight cath since. Patient presented to emergency room for acute onset of nausea and vomiting, started earlier today and gradually getting worse.  His symptoms improved with Zofran IV, received in the emergency room. Per family, he has been feeling tired and with poor appetite for the past 5 to 6 days. No fever or chills, no bleeding, no chest pain, no cough. Blood test done emergency room revealed elevated WBC at 15.5 and creatinine level of 1.29.  Troponin level is less than 0.03.  UA is positive for UTI. EKG shows V paced rhythm with dual AV paced complexes, no ST elevation. Patient is admitted for further evaluation and treatment.  PAST MEDICAL HISTORY:   Past Medical History:  Diagnosis Date  . Acquired hypothyroidism 11/02/2014  . BPH (benign prostatic hyperplasia)   . Cardiac defibrillator in place 11/02/2014  . Cardiomyopathy ([place])   . CHF (congestive heart failure) ([place])   . Chronic diastolic heart failure ([place]) 11/02/2014  . COPD (chronic obstructive pulmonary disease) ([place])   . Gastro-esophageal reflux disease without esophagitis 11/02/2014  . H/O malignant neoplasm 11/09/2014  . Heart valve disease 11/02/2014  . History of atrial fibrillation 11/02/2014  . Lung cancer ([place])   . Neuropathy 11/02/2014  . Orthostasis 11/02/2014  . Pure hypercholesterolemia  11/02/2014  . Valvular heart disease     PAST SURGICAL HISTORY:  Past Surgical History:  Procedure Laterality Date  . APPENDECTOMY    . CARDIAC VALVE REPLACEMENT     with a bovine valve  . DUAL ICD IMPLANT  2007/2009   implantable cardiac defibrillator  . PORT-A-CATH REMOVAL      SOCIAL HISTORY:  Social History   Tobacco Use  . Smoking status: Former Smoker    Types: Cigarettes    Last attempt to quit: 11/21/1981    Years since quitting: 35.9  . Smokeless tobacco: Never Used  Substance Use Topics  . Alcohol use: No    Alcohol/week: 0.0 standard drinks    FAMILY HISTORY:  Family History  Problem Relation Age of Onset  . Hypertension Unknown   . Heart disease Unknown   . CAD Father     DRUG ALLERGIES:  Allergies  Allergen Reactions  . Penicillin G Hives    Has patient had a PCN reaction causing immediate rash, facial/tongue/throat swelling, SOB or lightheadedness with hypotension: Yes Has patient had a PCN reaction causing severe rash involving mucus membranes or skin necrosis: No Has patient had a PCN reaction that required hospitalization No Has patient had a PCN reaction occurring within the last 10 years: No If all of the above answers are "NO", then may proceed with Cephalosporin use.  . Sulfa Antibiotics Rash    REVIEW OF SYSTEMS:   CONSTITUTIONAL: No fever, but positive for fatigue and generalized weakness.  EYES: No changes in vision.  EARS, NOSE, AND THROAT: No  tinnitus or ear pain.  RESPIRATORY: No cough, shortness of breath, wheezing or hemoptysis.  CARDIOVASCULAR: No chest pain, orthopnea, edema.  GASTROINTESTINAL: Positive for nausea, vomiting, no diarrhea or abdominal pain.  GENITOURINARY: No dysuria, hematuria.  Positive history of urinary retention, patient does self cath. ENDOCRINE: No polyuria, nocturia. HEMATOLOGY: No bleeding. SKIN: No rash or lesion. MUSCULOSKELETAL: No joint pain at this time.   NEUROLOGIC: No focal weakness.   PSYCHIATRY: No anxiety or depression.   MEDICATIONS AT HOME:  Prior to Admission medications   Medication Sig Start Date End Date Taking? Authorizing Provider  acidophilus (RISAQUAD) CAPS capsule Take 1 capsule by mouth every evening.     [provider]  apixaban (ELIQUIS) 5 MG TABS tablet Take 1 tablet (5 mg total) by mouth 2 (two) times daily. 09/02/17   Gladstone Lighter, MD  Ascorbic Acid (VITAMIN C) 1000 MG tablet Take 500 mg by mouth 2 (two) times daily.     [provider]  aspirin EC 81 MG tablet Take 81 mg by mouth daily.     [provider]  budesonide (PULMICORT) 0.25 MG/2ML nebulizer solution Take 2 mLs (0.25 mg total) by nebulization 2 (two) times daily. 05/29/17   Demetrios Loll, MD  Cholecalciferol 5000 units TABS Take 5,000 Units by mouth daily.    [provider]  Coenzyme Q10 (CO Q-10) 120 MG CAPS Take 120 mg by mouth every evening.    [provider]  digoxin (LANOXIN) 0.25 MG tablet Take 0.25 mg by mouth daily.     [provider]  fluticasone (FLONASE) 50 MCG/ACT nasal spray Place 2 sprays into both nostrils daily. 05/29/17   Demetrios Loll, MD  furosemide (LASIX) 20 MG tablet Take 20 mg by mouth every other day as needed for fluid.     [provider]  gabapentin (NEURONTIN) 600 MG tablet Take 600 mg by mouth 2 (two) times daily.    [provider]  hydrocortisone (CORTEF) 10 MG tablet Take 10 mg by mouth 2 (two) times daily.     [provider]  ipratropium-albuterol (DUONEB) 0.5-2.5 (3) MG/3ML SOLN Take 3 mLs by nebulization every 4 (four) hours as needed. 05/29/17   Demetrios Loll, MD  levothyroxine (SYNTHROID, LEVOTHROID) 100 MCG tablet Take 100 mcg by mouth daily before breakfast.    [provider]  magnesium oxide (MAG-OX) 400 (241.3 Mg) MG tablet Take 400 mg by mouth every evening.    [provider]  methenamine (HIPREX) 1 g tablet TAKE 1 TABLET BY MOUTH TWICE DAILY WITH MEALS  10/12/17   McGowan, Larene Beach A, PA-C  midodrine (PROAMATINE) 10 MG tablet Take 1 tablet (10 mg total) by mouth 3 (three) times daily as needed. If systolic BP <932 3/55/73   Fritzi Mandes, MD  Multiple Vitamin (MULTIVITAMIN WITH MINERALS) TABS tablet Take 1 tablet by mouth daily.    [provider]  pantoprazole (PROTONIX) 40 MG tablet Take 40 mg by mouth daily before breakfast.     [provider]  potassium chloride (K-DUR) 10 MEQ tablet Take 10 mEq by mouth daily as needed (for fluid retention when taking furosemide).    [provider]  pyridostigmine (MESTINON) 60 MG tablet Take 60 mg by mouth 2 (two) times daily. 03/11/17   [provider]  saw palmetto 500 MG capsule Take 1,000 mg by mouth every evening.    [provider]  simvastatin (ZOCOR) 20 MG tablet Take 20 mg by mouth every evening.  [provider]  vitamin B-12 (CYANOCOBALAMIN) 500 MCG tablet Take 500 mcg by mouth daily.     [provider]  vitamin E 400 UNIT capsule Take 400 Units by mouth every evening.     [provider]      PHYSICAL EXAMINATION:   VITAL SIGNS: Blood pressure (!) 127/109, pulse (!) 116, resp. rate 20, height 5\' 10"  (1.778 m), weight 77.1 kg, SpO2 100 %.  GENERAL:  83 y.o.-year-old patient lying in the bed with no acute distress, currently feeling better status post antinausea medication.  EYES: Pupils equal, round, reactive to light and accommodation. No scleral icterus. Extraocular muscles intact.  HEENT: Head atraumatic, normocephalic. Oropharynx and nasopharynx clear.  NECK:  Supple, no jugular venous distention. No thyroid enlargement, no tenderness.  LUNGS: Normal breath sounds bilaterally, no wheezing, rales,rhonchi or crepitation. No use of accessory muscles of respiration.  CARDIOVASCULAR: S1, S2 normal. No S3/S4.  ABDOMEN: Soft, nontender, nondistended. Bowel sounds present. No organomegaly or mass.  EXTREMITIES: No pedal  edema, cyanosis, or clubbing.  NEUROLOGIC: Cranial nerves II through XII are intact. Muscle strength 5/5 in all extremities. Sensation intact.   PSYCHIATRIC: The patient is alert and oriented x 3.  SKIN: No obvious rash, lesion, or ulcer.   LABORATORY PANEL:   CBC Recent Labs  Lab 10/17/17 1758  WBC 15.5*  HGB 15.8  HCT 46.8  PLT 279  MCV 90.8  MCH 30.7  MCHC 33.9  RDW 15.5*   ------------------------------------------------------------------------------------------------------------------  Chemistries  Recent Labs  Lab 10/17/17 1758  NA 140  K 4.2  CL 102  CO2 23  GLUCOSE 157*  BUN 23  CREATININE 1.29*  CALCIUM 9.7  AST 31  ALT 17  ALKPHOS 82  BILITOT 0.6   ------------------------------------------------------------------------------------------------------------------ estimated creatinine clearance is 47.2 mL/min (A) (by C-G formula based on SCr of 1.29 mg/dL (H)). ------------------------------------------------------------------------------------------------------------------ No results for input(s): TSH, T4TOTAL, T3FREE, THYROIDAB in the last 72 hours.  Invalid input(s): FREET3   Coagulation profile No results for input(s): INR, PROTIME in the last 168 hours. ------------------------------------------------------------------------------------------------------------------- No results for input(s): DDIMER in the last 72 hours. -------------------------------------------------------------------------------------------------------------------  Cardiac Enzymes Recent Labs  Lab 10/17/17 1758  TROPONINI <0.03   ------------------------------------------------------------------------------------------------------------------ Invalid input(s): POCBNP  ---------------------------------------------------------------------------------------------------------------  Urinalysis    Component Value Date/Time   COLORURINE YELLOW (A) 10/17/2017 1952    APPEARANCEUR HAZY (A) 10/17/2017 1952   APPEARANCEUR Cloudy (A) 02/24/2017 1400   LABSPEC 1.019 10/17/2017 1952   PHURINE 5.0 10/17/2017 1952   GLUCOSEU NEGATIVE 10/17/2017 1952   HGBUR NEGATIVE 10/17/2017 1952   BILIRUBINUR NEGATIVE 10/17/2017 1952   BILIRUBINUR Negative 02/24/2017 1400   KETONESUR NEGATIVE 10/17/2017 1952   PROTEINUR 30 (A) 10/17/2017 1952   NITRITE NEGATIVE 10/17/2017 1952   LEUKOCYTESUR MODERATE (A) 10/17/2017 1952   LEUKOCYTESUR 1+ (A) 02/24/2017 1400     RADIOLOGY: Dg Chest 2 View  Result Date: 10/17/2017 CLINICAL DATA:  Vomiting beginning today with diaphoresis and shortness of breath. EXAM: CHEST - 2 VIEW COMPARISON:  06/21/2017 and 08/31/2016 FINDINGS: Stable left-sided pacemaker. Stable prosthetic heart valve. There is stable elevation of the right hemidiaphragm and stable chronic changes of the right lung compatible with effusion and interstitial disease. Stable interstitial changes over the left lung. Cardiomediastinal silhouette and remainder of the exam is unchanged. IMPRESSION: No acute findings. Stable chronic interstitial disease as well as stable right effusion with right lung volume loss. Electronically Signed   By: Marin Olp M.D.   On: 10/17/2017 19:04  EKG: Orders placed or performed during the hospital encounter of 10/17/17  . ED EKG  . ED EKG    IMPRESSION AND PLAN:  1.  Acute UTI.  We will start IV fluids and IV antibiotics, while waiting for culture results. 2.  Intractable nausea and vomiting, improving with Zofran.  This could be related to UTI or GERD.  We will continue treatment with antinausea medication as needed and start Protonix to help with GERD symptoms from vomiting. 3.  Acute renal failure, likely prerenal secondary to dehydration from nausea and vomiting.  We will start gentle IV hydration and monitor kidney function closely.  Avoid nephrotoxic medications. 4.  Diastolic CHF.  Patient is currently clinically compensated,  will continue medical treatment. 5.  Paroxysmal atrial fibrillation.  Currently, heart rate is slightly elevated, around 100, likely secondary to acute infection and dehydration.  We will continue gentle IV hydration, digoxin and Eliquis.  Continue to monitor patient on telemetry. 6.  Urinary retention, continue self cath.  All the records are reviewed and case discussed with ED provider. Management plans discussed with the patient, family and they are in agreement.  CODE STATUS: DNR Code Status History    Date Active Date Inactive Code Status Order ID Comments User Context   08/31/2017 2104 09/02/2017 1540 DNR [id number]  Gladstone Lighter, MD Inpatient   06/21/2017 1420 06/24/2017 1512 DNR [id number]  Dustin Flock, MD Inpatient   05/22/2017 1609 05/29/2017 1740 DNR [id number]  Gladstone Lighter, MD Inpatient   11/17/2016 1631 11/20/2016 1602 DNR [id number]  Vaughan Basta, MD Inpatient   08/19/2016 1315 08/30/2016 1728 DNR [id number]  Harrie Foreman, MD Inpatient   03/02/2016 2236 03/05/2016 2020 DNR [id number]  Mikael Spray, NP ED   01/09/2016 0658 01/14/2016 1939 DNR [id number]  Harrie Foreman, MD Inpatient   11/09/2015 0318 11/12/2015 1713 DNR [id number]  Harrie Foreman, MD Inpatient   06/14/2015 1639 06/18/2015 1513 DNR [id number]  Demetrios Loll, MD Inpatient    Questions for Most Recent Historical Code Status (Order [id number])    Question Answer Comment   In the event of cardiac or respiratory ARREST Do not call a "code blue"    In the event of cardiac or respiratory ARREST Do not perform Intubation, CPR, defibrillation or ACLS    In the event of cardiac or respiratory ARREST Use medication by any route, position, wound care, and other measures to relive pain and suffering. May use oxygen, suction and manual treatment of airway obstruction as needed for comfort.        TOTAL TIME TAKING CARE OF THIS PATIENT: 50 minutes.    Amelia Jo M.D on 10/17/2017 at 9:47  PM  Between 7am to 6pm - Pager - [phone]  After 6pm go to [email or web address] - password [email or web address] [facility] [place] [facility]  [phone]  CC: Primary care physician; [name], MD

## 2017-10-17 NOTE — Progress Notes (Signed)
Attempted to get medication list from [facility] pharmacy, [place] Pharmacist at this store refused to fax list. While trying to explain, that [employer] hung up on me. I contacted store manager who contacted Pharmacy Manager for this store. Manger will be contacting main pharmacy in the morning. Passed information to [facility] on 3rd shift to pass to oncoming shift.

## 2017-10-17 NOTE — ED Notes (Signed)
Report called to 1c

## 2017-10-17 NOTE — ED Provider Notes (Signed)
[facility] Emergency Department Provider Note  ____________________________________________   I have reviewed the triage vital signs and the nursing notes.   HISTORY  Chief Complaint Emesis   History limited by: Not Limited   HPI Russell Howard is a 83 y.o. male who presents to the emergency department today brought in by family because of concerns for nausea and vomiting.  The patient had the symptoms started today at lunch.  Multiple episodes of vomiting.  The patient did have some abdominal discomfort with this.  Family states the patient has not been feeling well for the past week.  He does have a history of some chronic cough but it was worse after the vomiting episodes.  He denies any pain in his chest.  No fevers or chills appreciated.   Per medical record review patient has a history of COPD, CHF.   Past Medical History:  Diagnosis Date  . Acquired hypothyroidism 11/02/2014  . BPH (benign prostatic hyperplasia)   . Cardiac defibrillator in place 11/02/2014  . Cardiomyopathy ([place])   . CHF (congestive heart failure) ([place])   . Chronic diastolic heart failure ([place]) 11/02/2014  . COPD (chronic obstructive pulmonary disease) ([place])   . Gastro-esophageal reflux disease without esophagitis 11/02/2014  . H/O malignant neoplasm 11/09/2014  . Heart valve disease 11/02/2014  . History of atrial fibrillation 11/02/2014  . Lung cancer ([place])   . Neuropathy 11/02/2014  . Orthostasis 11/02/2014  . Pure hypercholesterolemia 11/02/2014  . Valvular heart disease     Patient Active Problem List   Diagnosis Date Noted  . Altered mental status 08/31/2017  . Advance care planning   . Adjustment disorder with mixed anxiety and depressed mood   . Grief   . Acute respiratory failure ([place]) 06/21/2017  . UTI (urinary tract infection) 11/17/2016  . Empyema ([place])   . Weakness   . Community acquired pneumonia   . Pleural effusion   . Palliative care by specialist   . Acute on  chronic respiratory failure with hypoxia ([place]) 08/19/2016  . Acute respiratory failure with hypoxia ([place]) 08/19/2016  . Septic shock ([place]) 03/02/2016  . Hydronephrosis, right 01/14/2016  . Constipation 01/13/2016  . Urinary retention 01/13/2016  . Lower abdominal pain 01/13/2016  . Bacteremia 01/13/2016  . Hypotension 01/13/2016  . Palliative care encounter   . Goals of care, counseling/discussion   . Cough   . Encounter for hospice care discussion   . Hypoxia 01/09/2016  . Primary cancer of bronchus of right lower lobe ([place]) 11/29/2015  . Sepsis ([place]) 06/14/2015  . Acute lower UTI 06/14/2015    Class: Acute  . H/O malignant neoplasm 11/09/2014  . Acquired hypothyroidism 11/02/2014  . Cardiomyopathy ([place]) 11/02/2014  . Acute on chronic diastolic CHF (congestive heart failure) ([place]) 11/02/2014  . Diabetes mellitus without complication ([place]) 24/58/0998  . Cardiac defibrillator in place 11/02/2014  . Gastro-esophageal reflux disease without esophagitis 11/02/2014  . History of atrial fibrillation 11/02/2014  . BP (high blood pressure) 11/02/2014  . Neuropathy 11/02/2014  . Orthostasis 11/02/2014  . Pure hypercholesterolemia 11/02/2014  . Heart valve disease 11/02/2014    Past Surgical History:  Procedure Laterality Date  . APPENDECTOMY    . CARDIAC VALVE REPLACEMENT     with a bovine valve  . DUAL ICD IMPLANT  2007/2009   implantable cardiac defibrillator  . PORT-A-CATH REMOVAL      Prior to Admission medications   Medication Sig Start Date End Date Taking? Authorizing Provider  acidophilus (  RISAQUAD) CAPS capsule Take 1 capsule by mouth every evening.     [provider]  apixaban (ELIQUIS) 5 MG TABS tablet Take 1 tablet (5 mg total) by mouth 2 (two) times daily. 09/02/17   Gladstone Lighter, MD  Ascorbic Acid (VITAMIN C) 1000 MG tablet Take 500 mg by mouth 2 (two) times daily.     [provider]  aspirin EC 81 MG tablet Take 81 mg by mouth daily.      [provider]  budesonide (PULMICORT) 0.25 MG/2ML nebulizer solution Take 2 mLs (0.25 mg total) by nebulization 2 (two) times daily. 05/29/17   Demetrios Loll, MD  Cholecalciferol 5000 units TABS Take 5,000 Units by mouth daily.    [provider]  Coenzyme Q10 (CO Q-10) 120 MG CAPS Take 120 mg by mouth every evening.    [provider]  digoxin (LANOXIN) 0.25 MG tablet Take 0.25 mg by mouth daily.     [provider]  fluticasone (FLONASE) 50 MCG/ACT nasal spray Place 2 sprays into both nostrils daily. 05/29/17   Demetrios Loll, MD  furosemide (LASIX) 20 MG tablet Take 20 mg by mouth every other day as needed for fluid.     [provider]  gabapentin (NEURONTIN) 600 MG tablet Take 600 mg by mouth 2 (two) times daily.    [provider]  hydrocortisone (CORTEF) 10 MG tablet Take 10 mg by mouth 2 (two) times daily.     [provider]  ipratropium-albuterol (DUONEB) 0.5-2.5 (3) MG/3ML SOLN Take 3 mLs by nebulization every 4 (four) hours as needed. 05/29/17   Demetrios Loll, MD  levothyroxine (SYNTHROID, LEVOTHROID) 100 MCG tablet Take 100 mcg by mouth daily before breakfast.    [provider]  magnesium oxide (MAG-OX) 400 (241.3 Mg) MG tablet Take 400 mg by mouth every evening.    [provider]  methenamine (HIPREX) 1 g tablet TAKE 1 TABLET BY MOUTH TWICE DAILY WITH MEALS 10/12/17   McGowan, Larene Beach A, PA-C  midodrine (PROAMATINE) 10 MG tablet Take 1 tablet (10 mg total) by mouth 3 (three) times daily as needed. If systolic BP <329 06/21/82   Fritzi Mandes, MD  Multiple Vitamin (MULTIVITAMIN WITH MINERALS) TABS tablet Take 1 tablet by mouth daily.    [provider]  pantoprazole (PROTONIX) 40 MG tablet Take 40 mg by mouth daily before breakfast.     [provider]  potassium chloride (K-DUR) 10 MEQ tablet Take 10 mEq by mouth daily as needed (for fluid retention when taking furosemide).    [provider]   pyridostigmine (MESTINON) 60 MG tablet Take 60 mg by mouth 2 (two) times daily. 03/11/17   [provider]  saw palmetto 500 MG capsule Take 1,000 mg by mouth every evening.    [provider]  simvastatin (ZOCOR) 20 MG tablet Take 20 mg by mouth every evening.     [provider]  vitamin B-12 (CYANOCOBALAMIN) 500 MCG tablet Take 500 mcg by mouth daily.     [provider]  vitamin E 400 UNIT capsule Take 400 Units by mouth every evening.     [provider]    Allergies Penicillin g and Sulfa antibiotics  Family History  Problem Relation Age of Onset  . Hypertension Unknown   . Heart disease Unknown   . CAD Father     Social History Social History   Tobacco Use  . Smoking status: Former Smoker    Types: Cigarettes  Last attempt to quit: 11/21/1981    Years since quitting: 35.9  . Smokeless tobacco: Never Used  Substance Use Topics  . Alcohol use: No    Alcohol/week: 0.0 standard drinks  . Drug use: No    Review of Systems Constitutional: No fever/chills Eyes: No visual changes. ENT: No sore throat. Cardiovascular: Denies chest pain. Respiratory: Denies shortness of breath. Positive for cough. Gastrointestinal: Positive for abdominal pain, nausea and vomiting.  Genitourinary: Negative for dysuria. Musculoskeletal: Negative for back pain. Skin: Negative for rash. Neurological: Negative for headaches, focal weakness or numbness.  ____________________________________________   PHYSICAL EXAM:  VITAL SIGNS: ED Triage Vitals  Enc Vitals Group     BP 10/17/17 1754 (!) 156/50     Pulse Rate 10/17/17 1754 (!) 43     Resp 10/17/17 1754 (!) 28     Temp --      Temp src --      SpO2 10/17/17 1754 91 %     Weight 10/17/17 1749 170 lb (77.1 kg)     Height 10/17/17 1749 5\' 10"  (1.778 m)     Head Circumference --      Peak Flow --      Pain Score 10/17/17 1748 8   Constitutional: Alert and oriented.  Eyes: Conjunctivae  are normal.  ENT      Head: Normocephalic and atraumatic.      Nose: No congestion/rhinnorhea.      Mouth/Throat: Mucous membranes are moist.      Neck: No stridor. Hematological/Lymphatic/Immunilogical: No cervical lymphadenopathy. Cardiovascular: Bradycardic, irregular rhythm. Respiratory: Frequent cough. No wheezing or crackles appreciated.  Gastrointestinal: Soft and non tender. No rebound. No guarding.  Genitourinary: Deferred Musculoskeletal: Normal range of motion in all extremities. No lower extremity edema. Neurologic:  Normal speech and language. No gross focal neurologic deficits are appreciated.  Skin:  Skin is warm, dry and intact. No rash noted. Psychiatric: Mood and affect are normal. Speech and behavior are normal. Patient exhibits appropriate insight and judgment.  ____________________________________________    LABS (pertinent positives/negatives)  Trop <0.03 Lipase 28 CBC wbc 15.5, hgb 15.8, plt 279 CMP na 140, k 4.2, glu 157, cr 1.29 UA hazy, moderate leukocytes, 21-50 wbc  ____________________________________________   EKG  I, Nance Pear, attending physician, personally viewed and interpreted this EKG  EKG Time: 1758 Rate: 87 Rhythm: v paced rhythm with dual av paced complexes Axis: left axis deviation Intervals: qtc 481 QRS: wide ST changes: no st elevation Impression: abnormal ekg   ____________________________________________    RADIOLOGY  CXR No acute disease  ____________________________________________   PROCEDURES  Procedures  ____________________________________________   INITIAL IMPRESSION / ASSESSMENT AND PLAN / ED COURSE  Pertinent labs & imaging results that were available during my care of the patient were reviewed by me and considered in my medical decision making (see chart for details).   Patient presented to the emergency department today because of concerns for nausea vomiting.  Work-up did show a  leukocytosis.  Additionally patient's urine is concerning for infection.  He does self cath.  He has had urinary tract infections in the past.  Will plan on starting IV antibiotics and plan on admission.  Discussed this with the patient.  ____________________________________________   FINAL CLINICAL IMPRESSION(S) / ED DIAGNOSES  Final diagnoses:  Lower urinary tract infectious disease     Note: This dictation was prepared with Dragon dictation. Any transcriptional errors that result from this process are unintentional     Nance Pear,  MD 10/17/17 2124

## 2017-10-17 NOTE — ED Triage Notes (Signed)
Here for vomiting that started about 20 minutes ago. Pt is slightly pale.  diaphoretic.  Having increased SHOB. Pursed lip breathing noted.  On 2 L baseline.  Pt does not appear well.

## 2017-10-18 LAB — CBC
HCT: 41.5 % (ref 40.0–52.0)
Hemoglobin: 14.1 g/dL (ref 13.0–18.0)
MCH: 30.8 pg (ref 26.0–34.0)
MCHC: 34 g/dL (ref 32.0–36.0)
MCV: 90.5 fL (ref 80.0–100.0)
PLATELETS: 185 10*3/uL (ref 150–440)
RBC: 4.58 MIL/uL (ref 4.40–5.90)
RDW: 15.4 % — AB (ref 11.5–14.5)
WBC: 10.1 10*3/uL (ref 3.8–10.6)

## 2017-10-18 LAB — GLUCOSE, CAPILLARY: Glucose-Capillary: 114 mg/dL — ABNORMAL HIGH (ref 70–99)

## 2017-10-18 LAB — MRSA PCR SCREENING: MRSA by PCR: NEGATIVE

## 2017-10-18 LAB — BASIC METABOLIC PANEL
ANION GAP: 9 (ref 5–15)
BUN: 23 mg/dL (ref 8–23)
CO2: 28 mmol/L (ref 22–32)
Calcium: 9.2 mg/dL (ref 8.9–10.3)
Chloride: 102 mmol/L (ref 98–111)
Creatinine, Ser: 1.03 mg/dL (ref 0.61–1.24)
GFR calc non Af Amer: >60 mL/min (ref >60)
GLUCOSE: 148 mg/dL — AB (ref 70–99)
Potassium: 5.1 mmol/L (ref 3.5–5.1)
Sodium: 139 mmol/L (ref 135–145)

## 2017-10-18 MED ORDER — INFLUENZA VAC SPLIT HIGH-DOSE 0.5 ML IM SUSY
0.5000 mL | PREFILLED_SYRINGE | INTRAMUSCULAR | Status: DC
  Filled 2017-10-18 (×2): qty 0.5

## 2017-10-18 MED ORDER — SODIUM CHLORIDE 0.9% FLUSH
3.0000 mL | Freq: Two times a day (BID) | INTRAVENOUS | Status: DC
  Administered 2017-10-18 – 2017-10-20 (×5): 3 mL via INTRAVENOUS

## 2017-10-18 MED ORDER — SODIUM CHLORIDE 0.9% FLUSH: 3.0000 mL | INTRAVENOUS | Status: DC | PRN

## 2017-10-18 NOTE — Progress Notes (Signed)
Reports feeling, "so tired" with severe fatigue which he states is normal for him. Requires 1-2+ to [facility] with pt unable tolerate going to chair at this time. PT consult submitted. Pt performs self urinary cath without difficulty with 1+. Dgt in. IVF's completing. No acute distress. Will continue antibiotic therapy for UTI.

## 2017-10-18 NOTE — Progress Notes (Signed)
Russell Howard at [place] NAME: Russell Howard    MR#:  [id number]  DATE OF BIRTH:  December 10, 1934  SUBJECTIVE:  CHIEF COMPLAINT:   Chief Complaint  Patient presents with  . Emesis   Has chronic urinary retention and using in and out catheter.  Brought with nausea and vomiting.  Found to have a UTI and some dehydration.  Feeling better today.  REVIEW OF SYSTEMS:  CONSTITUTIONAL: No fever, he have fatigue or weakness.  EYES: No blurred or double vision.  EARS, NOSE, AND THROAT: No tinnitus or ear pain.  RESPIRATORY: No cough, shortness of breath, wheezing or hemoptysis.  CARDIOVASCULAR: No chest pain, orthopnea, edema.  GASTROINTESTINAL: Had some nausea, vomiting, no diarrhea or abdominal pain.  GENITOURINARY: No dysuria, hematuria.  ENDOCRINE: No polyuria, nocturia,  HEMATOLOGY: No anemia, easy bruising or bleeding SKIN: No rash or lesion. MUSCULOSKELETAL: No joint pain or arthritis.   NEUROLOGIC: No tingling, numbness, weakness.  PSYCHIATRY: No anxiety or depression.   ROS  DRUG ALLERGIES:   Allergies  Allergen Reactions  . Penicillin G Hives    Has patient had a PCN reaction causing immediate rash, facial/tongue/throat swelling, SOB or lightheadedness with hypotension: Yes Has patient had a PCN reaction causing severe rash involving mucus membranes or skin necrosis: No Has patient had a PCN reaction that required hospitalization No Has patient had a PCN reaction occurring within the last 10 years: No If all of the above answers are "NO", then may proceed with Cephalosporin use.  . Sulfa Antibiotics Rash    VITALS:  Blood pressure 101/67, pulse 99, temperature 98.4 F (36.9 C), temperature source Oral, resp. rate 20, height 5\' 10"  (1.778 m), weight 77.2 kg, SpO2 97 %.  PHYSICAL EXAMINATION:  GENERAL:  83 y.o.-year-old patient lying in the bed with no acute distress.  EYES: Pupils equal, round, reactive to light and accommodation. No  scleral icterus. Extraocular muscles intact.  HEENT: Head atraumatic, normocephalic. Oropharynx and nasopharynx clear.  NECK:  Supple, no jugular venous distention. No thyroid enlargement, no tenderness.  LUNGS: Normal breath sounds bilaterally, no wheezing, rales,rhonchi or crepitation. No use of accessory muscles of respiration.  CARDIOVASCULAR: S1, S2 normal. No murmurs, rubs, or gallops.  ABDOMEN: Soft, nontender, nondistended. Bowel sounds present. No organomegaly or mass.  EXTREMITIES: No pedal edema, cyanosis, or clubbing.  NEUROLOGIC: Cranial nerves II through XII are intact. Muscle strength 5/5 in all extremities. Sensation intact. Gait not checked.  PSYCHIATRIC: The patient is alert and oriented x 2.  SKIN: No obvious rash, lesion, or ulcer.   Physical Exam LABORATORY PANEL:   CBC Recent Labs  Lab 10/18/17 0455  WBC 10.1  HGB 14.1  HCT 41.5  PLT 185   ------------------------------------------------------------------------------------------------------------------  Chemistries  Recent Labs  Lab 10/17/17 1758 10/18/17 0455  NA 140 139  K 4.2 5.1  CL 102 102  CO2 23 28  GLUCOSE 157* 148*  BUN 23 23  CREATININE 1.29* 1.03  CALCIUM 9.7 9.2  AST 31  --   ALT 17  --   ALKPHOS 82  --   BILITOT 0.6  --    ------------------------------------------------------------------------------------------------------------------  Cardiac Enzymes Recent Labs  Lab 10/17/17 1758  TROPONINI <0.03   ------------------------------------------------------------------------------------------------------------------  RADIOLOGY:  Dg Chest 2 View  Result Date: 10/17/2017 CLINICAL DATA:  Vomiting beginning today with diaphoresis and shortness of breath. EXAM: CHEST - 2 VIEW COMPARISON:  06/21/2017 and 08/31/2016 FINDINGS: Stable left-sided pacemaker. Stable prosthetic heart valve. There is  stable elevation of the right hemidiaphragm and stable chronic changes of the right lung  compatible with effusion and interstitial disease. Stable interstitial changes over the left lung. Cardiomediastinal silhouette and remainder of the exam is unchanged. IMPRESSION: No acute findings. Stable chronic interstitial disease as well as stable right effusion with right lung volume loss. Electronically Signed   By: Marin Olp M.D.   On: 10/17/2017 19:04    ASSESSMENT AND PLAN:   Active Problems:   Acute UTI  1.  Acute UTI.   IV fluids and IV antibiotics, while waiting for culture results.  2.  Intractable nausea and vomiting, improving with Zofran.  This could be related to UTI or GERD.  We will continue treatment with antinausea medication as needed and start Protonix to help with GERD symptoms from vomiting.  3.  Acute renal failure, likely prerenal secondary to dehydration from nausea and vomiting.   IV hydration and monitor kidney function closely.  Avoid nephrotoxic medications.  4.  Diastolic CHF.  Patient is currently clinically compensated, will continue medical treatment.  5.  Paroxysmal atrial fibrillation.  Currently, heart rate is slightly elevated, around 100, likely secondary to acute infection and dehydration.  We will continue gentle IV hydration, digoxin and Eliquis.  Continue to monitor patient.  6.  Urinary retention, continue self cath.  Patient walks with a walker and lives with daughter and her family at home.  All the records are reviewed and case discussed with Care Management/Social Workerr. Management plans discussed with the patient, family and they are in agreement.  CODE STATUS: DNR  TOTAL TIME TAKING CARE OF THIS PATIENT: 35 minutes.   Discussed with his daughter in the room.  POSSIBLE D/C IN 1-2 DAYS, DEPENDING ON CLINICAL CONDITION.   Vaughan Basta M.D on 10/18/2017   Between 7am to 6pm - Pager - [phone]  After 6pm go to [email or web address] - password [email or web address] [place] [facility]  Office   [phone]  CC: Primary care physician; [name], MD  Note: This dictation was prepared with Dragon dictation along with smaller phrase technology. Any transcriptional errors that result from this process are unintentional.

## 2017-10-18 NOTE — Progress Notes (Signed)
Family Meeting Note  Advance Directive:yes  Today a meeting took place with the Patient and daughter.  The following clinical team members were present during this meeting:MD  The following were discussed:Patient's diagnosis: UTI, urinary retention, generalized weakness, Patient's progosis: Unable to determine and Goals for treatment: DNR  Additional follow-up to be provided: PMD  Time spent during discussion:20 minutes  Vaughan Basta, MD

## 2017-10-19 ENCOUNTER — Ambulatory Visit: Payer: Medicare Other | Admitting: Pulmonary Disease

## 2017-10-19 LAB — CBC
HEMATOCRIT: 42 % (ref 40.0–52.0)
HEMOGLOBIN: 14 g/dL (ref 13.0–18.0)
MCH: 30 pg (ref 26.0–34.0)
MCHC: 33.3 g/dL (ref 32.0–36.0)
MCV: 90.1 fL (ref 80.0–100.0)
Platelets: 165 10*3/uL (ref 150–440)
RBC: 4.66 MIL/uL (ref 4.40–5.90)
RDW: 15.7 % — ABNORMAL HIGH (ref 11.5–14.5)
WBC: 10.3 10*3/uL (ref 3.8–10.6)

## 2017-10-19 LAB — URINE CULTURE: CULTURE: NO GROWTH

## 2017-10-19 LAB — BASIC METABOLIC PANEL
ANION GAP: 9 (ref 5–15)
BUN: 21 mg/dL (ref 8–23)
CHLORIDE: 100 mmol/L (ref 98–111)
CO2: 27 mmol/L (ref 22–32)
CREATININE: 1.05 mg/dL (ref 0.61–1.24)
Calcium: 9 mg/dL (ref 8.9–10.3)
GFR calc Af Amer: >60 mL/min (ref >60)
GFR calc non Af Amer: >60 mL/min (ref >60)
GLUCOSE: 137 mg/dL — AB (ref 70–99)
Potassium: 4.4 mmol/L (ref 3.5–5.1)
Sodium: 136 mmol/L (ref 135–145)

## 2017-10-19 LAB — GLUCOSE, CAPILLARY: Glucose-Capillary: 119 mg/dL — ABNORMAL HIGH (ref 70–99)

## 2017-10-19 MED ORDER — CEPHALEXIN 500 MG PO CAPS
500.0000 mg | ORAL_CAPSULE | Freq: Two times a day (BID) | ORAL | Status: DC
  Administered 2017-10-19 – 2017-10-20 (×3): 500 mg via ORAL
  Filled 2017-10-19 (×3): qty 1

## 2017-10-19 NOTE — Progress Notes (Signed)
Please note patient is currently followed by outpatient PALLIATIVE at home. CMRN Orvan July made aware. Flo Shanks RN, BSN, [facility] [place], hospital liaison [phone]

## 2017-10-19 NOTE — Progress Notes (Signed)
[place] at [place] NAME: Russell Howard    MR#:  [id number]  DATE OF BIRTH:  Apr 06, 1934  SUBJECTIVE:  CHIEF COMPLAINT:   Chief Complaint  Patient presents with  . Emesis   Has chronic urinary retention and using in and out catheter.  Brought with nausea and vomiting.  Found to have a UTI and some dehydration.Had some bleed while trying in and out catheter in hospital, feels more weak today.  REVIEW OF SYSTEMS:  CONSTITUTIONAL: No fever, he have fatigue or weakness.  EYES: No blurred or double vision.  EARS, NOSE, AND THROAT: No tinnitus or ear pain.  RESPIRATORY: No cough, shortness of breath, wheezing or hemoptysis.  CARDIOVASCULAR: No chest pain, orthopnea, edema.  GASTROINTESTINAL: Had some nausea, vomiting, no diarrhea or abdominal pain.  GENITOURINARY: No dysuria, hematuria.  ENDOCRINE: No polyuria, nocturia,  HEMATOLOGY: No anemia, easy bruising or bleeding SKIN: No rash or lesion. MUSCULOSKELETAL: No joint pain or arthritis.   NEUROLOGIC: No tingling, numbness, weakness.  PSYCHIATRY: No anxiety or depression.   ROS  DRUG ALLERGIES:   Allergies  Allergen Reactions  . Penicillin G Hives    Has patient had a PCN reaction causing immediate rash, facial/tongue/throat swelling, SOB or lightheadedness with hypotension: Yes Has patient had a PCN reaction causing severe rash involving mucus membranes or skin necrosis: No Has patient had a PCN reaction that required hospitalization No Has patient had a PCN reaction occurring within the last 10 years: No If all of the above answers are "NO", then may proceed with Cephalosporin use.  . Sulfa Antibiotics Rash    VITALS:  Blood pressure 103/63, pulse (!) 106, temperature 98.3 F (36.8 C), resp. rate 18, height 5\' 10"  (1.778 m), weight 78.1 kg, SpO2 96 %.  PHYSICAL EXAMINATION:  GENERAL:  83 y.o.-year-old patient lying in the bed with no acute distress.  EYES: Pupils equal, round,  reactive to light and accommodation. No scleral icterus. Extraocular muscles intact.  HEENT: Head atraumatic, normocephalic. Oropharynx and nasopharynx clear.  NECK:  Supple, no jugular venous distention. No thyroid enlargement, no tenderness.  LUNGS: Normal breath sounds bilaterally, no wheezing, rales,rhonchi or crepitation. No use of accessory muscles of respiration.  CARDIOVASCULAR: S1, S2 normal. No murmurs, rubs, or gallops.  ABDOMEN: Soft, nontender, nondistended. Bowel sounds present. No organomegaly or mass.  EXTREMITIES: No pedal edema, cyanosis, or clubbing.  NEUROLOGIC: Cranial nerves II through XII are intact. Muscle strength 4/5 in all extremities. Sensation intact. Gait not checked.  PSYCHIATRIC: The patient is alert and oriented x 2.  SKIN: No obvious rash, lesion, or ulcer.   Physical Exam LABORATORY PANEL:   CBC Recent Labs  Lab 10/19/17 0445  WBC 10.3  HGB 14.0  HCT 42.0  PLT 165   ------------------------------------------------------------------------------------------------------------------  Chemistries  Recent Labs  Lab 10/17/17 1758  10/19/17 0445  NA 140   < > 136  K 4.2   < > 4.4  CL 102   < > 100  CO2 23   < > 27  GLUCOSE 157*   < > 137*  BUN 23   < > 21  CREATININE 1.29*   < > 1.05  CALCIUM 9.7   < > 9.0  AST 31  --   --   ALT 17  --   --   ALKPHOS 82  --   --   BILITOT 0.6  --   --    < > = values in this  interval not displayed.   ------------------------------------------------------------------------------------------------------------------  Cardiac Enzymes Recent Labs  Lab 10/17/17 1758  TROPONINI <0.03   ------------------------------------------------------------------------------------------------------------------  RADIOLOGY:  Dg Chest 2 View  Result Date: 10/17/2017 CLINICAL DATA:  Vomiting beginning today with diaphoresis and shortness of breath. EXAM: CHEST - 2 VIEW COMPARISON:  06/21/2017 and 08/31/2016 FINDINGS:  Stable left-sided pacemaker. Stable prosthetic heart valve. There is stable elevation of the right hemidiaphragm and stable chronic changes of the right lung compatible with effusion and interstitial disease. Stable interstitial changes over the left lung. Cardiomediastinal silhouette and remainder of the exam is unchanged. IMPRESSION: No acute findings. Stable chronic interstitial disease as well as stable right effusion with right lung volume loss. Electronically Signed   By: Marin Olp M.D.   On: 10/17/2017 19:04    ASSESSMENT AND PLAN:   Active Problems:   Acute UTI  1.  Acute UTI.   IV fluids and IV antibiotics, while waiting for culture results.  2.  Intractable nausea and vomiting, improving with Zofran.  This could be related to UTI or GERD.  We will continue treatment with antinausea medication as needed and start Protonix to help with GERD symptoms from vomiting.  3.  Acute renal failure, likely prerenal secondary to dehydration from nausea and vomiting.   IV hydration and monitor kidney function closely.  Avoid nephrotoxic medications.  4.  Diastolic CHF.  Patient is currently clinically compensated, will continue medical treatment.  5.  Paroxysmal atrial fibrillation.  Currently, heart rate is slightly elevated, around 100, likely secondary to acute infection and dehydration.  We will continue gentle IV hydration, digoxin and Eliquis.  Continue to monitor patient.  6.  Urinary retention, continue self cath.  Patient walks with a walker and lives with daughter and her family at home. Will need Home health PT arrangement on discharge.  All the records are reviewed and case discussed with Care Management/Social Workerr. Management plans discussed with the patient, family and they are in agreement.  CODE STATUS: DNR  TOTAL TIME TAKING CARE OF THIS PATIENT: 35 minutes.   Discussed with his daughter in the room.  POSSIBLE D/C IN 1-2 DAYS, DEPENDING ON CLINICAL  CONDITION.   Vaughan Basta M.D on 10/19/2017   Between 7am to 6pm - Pager - [phone]  After 6pm go to [email or web address] - password [email or web address] [place] [facility]  Office  [phone]  CC: Primary care physician; [name], MD  Note: This dictation was prepared with Dragon dictation along with smaller phrase technology. Any transcriptional errors that result from this process are unintentional.

## 2017-10-19 NOTE — Progress Notes (Signed)
Pul[employer] SVN D/C'd at pt request.

## 2017-10-20 LAB — BASIC METABOLIC PANEL
Anion gap: 7 (ref 5–15)
BUN: 23 mg/dL (ref 8–23)
CHLORIDE: 100 mmol/L (ref 98–111)
CO2: 28 mmol/L (ref 22–32)
CREATININE: 0.96 mg/dL (ref 0.61–1.24)
Calcium: 9.2 mg/dL (ref 8.9–10.3)
GFR calc Af Amer: >60 mL/min (ref >60)
GFR calc non Af Amer: >60 mL/min (ref >60)
GLUCOSE: 145 mg/dL — AB (ref 70–99)
POTASSIUM: 4.4 mmol/L (ref 3.5–5.1)
Sodium: 135 mmol/L (ref 135–145)

## 2017-10-20 LAB — CBC
HCT: 40.6 % (ref 40.0–52.0)
Hemoglobin: 13.7 g/dL (ref 13.0–18.0)
MCH: 30.3 pg (ref 26.0–34.0)
MCHC: 33.7 g/dL (ref 32.0–36.0)
MCV: 89.9 fL (ref 80.0–100.0)
Platelets: 178 10*3/uL (ref 150–440)
RBC: 4.52 MIL/uL (ref 4.40–5.90)
RDW: 15.5 % — ABNORMAL HIGH (ref 11.5–14.5)
WBC: 9.3 10*3/uL (ref 3.8–10.6)

## 2017-10-20 LAB — GLUCOSE, CAPILLARY: Glucose-Capillary: 118 mg/dL — ABNORMAL HIGH (ref 70–99)

## 2017-10-20 MED ORDER — CEPHALEXIN 500 MG PO CAPS: 500.0000 mg | ORAL_CAPSULE | Freq: Two times a day (BID) | ORAL | 0 refills | Status: AC

## 2017-10-20 NOTE — Plan of Care (Signed)
Pt is being d/ced home.  Asked if he felt ready to go and he affirmed.  This am, he was very dizzy and SOB when he sat up to self-cath.  He states he feels tired all the time. He is to follow up with the urologist and pulmonologist. Pt's daughter requested getting [facility].  Communicated this to Education officer, museum.  Pt will go home with daughter.

## 2017-10-20 NOTE — Care Management Note (Signed)
Case Management Note  Patient Details  Name: Russell Howard MRN: [id number] Date of Birth: December 31, 1934  Subjective/Objective:                 Patient presents from home due to vomiting.  Currently being treated for UTI.  Being treated with IVFs and IV antibiotics.  He is followed by [facility] outpatient palliative.  Chronic home 02. Has been followed by [facility].   Action/Plan:  Reaching out to [facility] to determine if patient is still open for services  Expected Discharge Date:                  Expected Discharge Plan:     In-House Referral:     Discharge planning Services     Post Acute Care Choice:    Choice offered to:     DME Arranged:    DME Agency:     HH Arranged:    [place] Agency:     Status of Service:     If discussed at [employer], dates discussed:    Additional Comments:  Katrina Stack, RN 10/20/2017, 8:11 AM

## 2017-10-20 NOTE — Care Management (Addendum)
Patient is currently open to [facility] RN and PT.  Requested order to resume from attending.  Notified [facility] of discharge.  Notified Outpatient Palliative of discharge

## 2017-10-20 NOTE — Care Management Important Message (Signed)
Copy of signed IM left with patient in room.  

## 2017-10-20 NOTE — Discharge Summary (Signed)
[facility] NAME: Russell Howard    MR#:  [id number]  DATE OF BIRTH:  1934-10-18  DATE OF ADMISSION:  10/17/2017 ADMITTING PHYSICIAN: Amelia Jo, MD  DATE OF DISCHARGE: 10/20/2017   PRIMARY CARE PHYSICIAN: [name], MD    ADMISSION DIAGNOSIS:  Lower urinary tract infectious disease [N39.0]  DISCHARGE DIAGNOSIS:  Active Problems:   Acute UTI   SECONDARY DIAGNOSIS:   Past Medical History:  Diagnosis Date  . Acquired hypothyroidism 11/02/2014  . BPH (benign prostatic hyperplasia)   . Cardiac defibrillator in place 11/02/2014  . Cardiomyopathy ([place])   . CHF (congestive heart failure) ([place])   . Chronic diastolic heart failure ([place]) 11/02/2014  . COPD (chronic obstructive pulmonary disease) ([place])   . Gastro-esophageal reflux disease without esophagitis 11/02/2014  . H/O malignant neoplasm 11/09/2014  . Heart valve disease 11/02/2014  . History of atrial fibrillation 11/02/2014  . Lung cancer ([place])   . Neuropathy 11/02/2014  . Orthostasis 11/02/2014  . Pure hypercholesterolemia 11/02/2014  . Valvular heart disease     HOSPITAL COURSE:   1.Acute UTI. IV fluids and IV antibiotics,while waiting for culture results.  Cultures are negative, patient improved, will give oral Keflex on discharge.  2.Intractable nausea and vomiting,improving with Zofran.This could be related to UTI or GERD.We will continue treatment with antinausea medication as needed and start Protonix to help with GERD symptoms from vomiting. Improved much.  3.Acute renal failure, likely prerenal secondary to dehydration from nausea and vomiting.  IV hydration and monitor kidney function closely.Avoid nephrotoxic medications.  4.Diastolic CHF.Patient is currently clinically compensated,will continue medical treatment.  5.Paroxysmal atrial fibrillation.Currently, heart rate is slightly elevated, around 100,likely  secondary to acute infection and dehydration. We will continue gentle IV hydration, digoxin and Eliquis. Continue to monitor patient.  6.Urinary retention,continue self cath.  7. Hypotension Patient have chronic complaint of hypotension he takes steroids and midodrine at home, continue the same here.  Patient walks with a walker and lives with daughter and her family at home. Will need Home health PT arrangement on discharge.  DISCHARGE CONDITIONS:   Stable  CONSULTS OBTAINED:    DRUG ALLERGIES:   Allergies  Allergen Reactions  . Penicillin G Hives    Has patient had a PCN reaction causing immediate rash, facial/tongue/throat swelling, SOB or lightheadedness with hypotension: Yes Has patient had a PCN reaction causing severe rash involving mucus membranes or skin necrosis: No Has patient had a PCN reaction that required hospitalization No Has patient had a PCN reaction occurring within the last 10 years: No If all of the above answers are "NO", then may proceed with Cephalosporin use.  . Sulfa Antibiotics Rash    DISCHARGE MEDICATIONS:   Allergies as of 10/20/2017      Reactions   Penicillin G Hives   Has patient had a PCN reaction causing immediate rash, facial/tongue/throat swelling, SOB or lightheadedness with hypotension: Yes Has patient had a PCN reaction causing severe rash involving mucus membranes or skin necrosis: No Has patient had a PCN reaction that required hospitalization No Has patient had a PCN reaction occurring within the last 10 years: No If all of the above answers are "NO", then may proceed with Cephalosporin use.   Sulfa Antibiotics Rash      Medication List    TAKE these medications   acidophilus Caps capsule Take 1 capsule by mouth every evening.   apixaban 5 MG Tabs tablet Commonly known as:  ELIQUIS Take 1 tablet (5 mg total) by mouth 2 (two) times daily.   aspirin EC 81 MG tablet Take 81 mg by mouth daily.   cephALEXin 500 MG  capsule Commonly known as:  KEFLEX Take 1 capsule (500 mg total) by mouth every 12 (twelve) hours for 3 days.   Cholecalciferol 5000 units Tabs Take 5,000 Units by mouth daily.   Co Q-10 120 MG Caps Take 120 mg by mouth every evening.   digoxin 0.25 MG tablet Commonly known as:  LANOXIN Take 0.25 mg by mouth daily.   gabapentin 600 MG tablet Commonly known as:  NEURONTIN Take 600 mg by mouth 2 (two) times daily.   hydrocortisone 10 MG tablet Commonly known as:  CORTEF Take 10 mg by mouth 2 (two) times daily.   levothyroxine 100 MCG tablet Commonly known as:  SYNTHROID, LEVOTHROID Take 100 mcg by mouth daily before breakfast.   magnesium oxide 400 (241.3 Mg) MG tablet Commonly known as:  MAG-OX Take 400 mg by mouth every evening.   methenamine 1 g tablet Commonly known as:  HIPREX TAKE 1 TABLET BY MOUTH TWICE DAILY WITH MEALS   midodrine 10 MG tablet Commonly known as:  PROAMATINE Take 1 tablet (10 mg total) by mouth 3 (three) times daily as needed. If systolic BP <782   multivitamin with minerals Tabs tablet Take 1 tablet by mouth daily.   pantoprazole 40 MG tablet Commonly known as:  PROTONIX Take 40 mg by mouth daily before breakfast.   saw palmetto 500 MG capsule Take 1,000 mg by mouth every evening.   simvastatin 20 MG tablet Commonly known as:  ZOCOR Take 20 mg by mouth every evening.   vitamin B-12 500 MCG tablet Commonly known as:  CYANOCOBALAMIN Take 500 mcg by mouth daily.   vitamin C 1000 MG tablet Take 500 mg by mouth 2 (two) times daily.   vitamin E 400 UNIT capsule Take 400 Units by mouth every evening.        DISCHARGE INSTRUCTIONS:    Follow with primary care physician and pulmonologist in the next 1 to 2 weeks with  If you experience worsening of your admission symptoms, develop shortness of breath, life threatening emergency, suicidal or homicidal thoughts you must seek medical attention immediately by calling 911 or calling your  MD immediately  if symptoms less severe.  You Must read complete instructions/literature along with all the possible adverse reactions/side effects for all the Medicines you take and that have been prescribed to you. Take any new Medicines after you have completely understood and accept all the possible adverse reactions/side effects.   Please note  You were cared for by a hospitalist during your hospital stay. If you have any questions about your discharge medications or the care you received while you were in the hospital after you are discharged, you can call the unit and asked to speak with the hospitalist on call if the hospitalist that took care of you is not available. Once you are discharged, your primary care physician will handle any further medical issues. Please note that NO REFILLS for any discharge medications will be authorized once you are discharged, as it is imperative that you return to your primary care physician (or establish a relationship with a primary care physician if you do not have one) for your aftercare needs so that they can reassess your need for medications and monitor your lab values.    Today   CHIEF COMPLAINT:   Chief  Complaint  Patient presents with  . Emesis    HISTORY OF PRESENT ILLNESS:  Pau Patman  is a 83 y.o. male with a known history of BPH, cardiomyopathy, CHF, diastolic heart failure, COPD, GERD, atrial fibrillation and recurrent urinary tract infections.  Patient was diagnosed with urinary retention approximately 2 years ago and he has been doing straight cath since. Patient presented to emergency room for acute onset of nausea and vomiting, started earlier today and gradually getting worse.  His symptoms improved with Zofran IV, received in the emergency room. Per family, he has been feeling tired and with poor appetite for the past 5 to 6 days. No fever or chills, no bleeding, no chest pain, no cough. Blood test done emergency room revealed  elevated WBC at 15.5 and creatinine level of 1.29.  Troponin level is less than 0.03.  UA is positive for UTI. EKG shows V paced rhythm with dual AV paced complexes, no ST elevation. Patient is admitted for further evaluation and treatment.   VITAL SIGNS:  Blood pressure 93/66, pulse (!) 104, temperature 97.8 F (36.6 C), resp. rate 18, height 5\' 10"  (1.778 m), weight 75.3 kg, SpO2 97 %.  I/O:    Intake/Output Summary (Last 24 hours) at 10/20/2017 1426 Last data filed at 10/20/2017 0923 Gross per 24 hour  Intake 240 ml  Output 1595 ml  Net -1355 ml    PHYSICAL EXAMINATION:   GENERAL:  83 y.o.-year-old patient lying in the bed with no acute distress.  EYES: Pupils equal, round, reactive to light and accommodation. No scleral icterus. Extraocular muscles intact.  HEENT: Head atraumatic, normocephalic. Oropharynx and nasopharynx clear.  NECK:  Supple, no jugular venous distention. No thyroid enlargement, no tenderness.  LUNGS: Normal breath sounds bilaterally, no wheezing, rales,rhonchi or crepitation. No use of accessory muscles of respiration.  CARDIOVASCULAR: S1, S2 normal. No murmurs, rubs, or gallops.  ABDOMEN: Soft, nontender, nondistended. Bowel sounds present. No organomegaly or mass.  EXTREMITIES: No pedal edema, cyanosis, or clubbing.  NEUROLOGIC: Cranial nerves II through XII are intact. Muscle strength 4/5 in all extremities. Sensation intact. Gait not checked.  PSYCHIATRIC: The patient is alert and oriented x 2.  SKIN: No obvious rash, lesion, or ulcer.   DATA REVIEW:   CBC Recent Labs  Lab 10/20/17 0303  WBC 9.3  HGB 13.7  HCT 40.6  PLT 178    Chemistries  Recent Labs  Lab 10/17/17 1758  10/20/17 0303  NA 140   < > 135  K 4.2   < > 4.4  CL 102   < > 100  CO2 23   < > 28  GLUCOSE 157*   < > 145*  BUN 23   < > 23  CREATININE 1.29*   < > 0.96  CALCIUM 9.7   < > 9.2  AST 31  --   --   ALT 17  --   --   ALKPHOS 82  --   --   BILITOT 0.6  --   --    <  > = values in this interval not displayed.    Cardiac Enzymes Recent Labs  Lab 10/17/17 1758  TROPONINI <0.03    Microbiology Results  Results for orders placed or performed during the hospital encounter of 10/17/17  Urine Culture     Status: None   Collection Time: 10/17/17  7:52 PM  Result Value Ref [place] Status   Specimen Description   Final    URINE, RANDOM  Performed at [facility], [address] [place], [place] [address]    Special Requests   Final    NONE Performed at [facility], [address], [place], [place] [address]    Culture   Final    NO GROWTH Performed at [facility], [place] [address], [place], [place] [address]    Report Status 10/19/2017 FINAL  Final  MRSA PCR Screening     Status: None   Collection Time: 10/17/17 11:55 PM  Result Value Ref [place] Status   MRSA by PCR NEGATIVE NEGATIVE Final    Comment:        The GeneXpert MRSA Assay (FDA approved for NASAL specimens only), is one component of a comprehensive MRSA colonization surveillance program. It is not intended to diagnose MRSA infection nor to guide or monitor treatment for MRSA infections. Performed at [facility], [address], [place], [place] [address]     RADIOLOGY:  No results found.  EKG:   Orders placed or performed during the hospital encounter of 10/17/17  . ED EKG  . ED EKG      Management plans discussed with the patient, family and they are in agreement.  CODE STATUS: DNR    Code Status Orders  (From admission, onward)         Start     Ordered   10/17/17 2300  Do not attempt resuscitation (DNR)  Continuous    Question Answer Comment  In the event of cardiac or respiratory ARREST Do not call a "code blue"   In the event of cardiac or respiratory ARREST Do not perform Intubation, CPR, defibrillation or ACLS   In the event of cardiac or respiratory ARREST Use medication by any route, position, wound care, and other  measures to relive pain and suffering. May use oxygen, suction and manual treatment of airway obstruction as needed for comfort.      10/17/17 2259        Code Status History    Date Active Date Inactive Code Status Order ID Comments User Context   08/31/2017 2104 09/02/2017 1540 DNR [id number]  Gladstone Lighter, MD Inpatient   06/21/2017 1420 06/24/2017 1512 DNR [id number]  Dustin Flock, MD Inpatient   05/22/2017 1609 05/29/2017 1740 DNR [id number]  Gladstone Lighter, MD Inpatient   11/17/2016 1631 11/20/2016 1602 DNR [id number]  Vaughan Basta, MD Inpatient   08/19/2016 1315 08/30/2016 1728 DNR [id number]  Harrie Foreman, MD Inpatient   03/02/2016 2236 03/05/2016 2020 DNR [id number]  Mikael Spray, NP ED   01/09/2016 0658 01/14/2016 1939 DNR [id number]  Harrie Foreman, MD Inpatient   11/09/2015 0318 11/12/2015 1713 DNR [id number]  Harrie Foreman, MD Inpatient   06/14/2015 1639 06/18/2015 1513 DNR [id number]  Demetrios Loll, MD Inpatient    Advance Directive Documentation     Most Recent Value  Type of Advance Directive  Out of facility DNR (pink MOST or yellow form)  Pre-existing out of facility DNR order (yellow form or pink MOST form)  -  "MOST" Form in Place?  -      TOTAL TIME TAKING CARE OF THIS PATIENT: 35 minutes.    Vaughan Basta M.D on 10/20/2017 at 2:26 PM  Between 7am to 6pm - Pager - [phone]  After 6pm go to [email or web address] - password [email or web address] [facility]  Office  [phone]  CC: Primary care physician; [name], MD   Note: This dictation was prepared with  Dragon dictation along with smaller Company secretary. Any transcriptional errors that result from this process are unintentional.

## 2017-10-21 ENCOUNTER — Ambulatory Visit (INDEPENDENT_AMBULATORY_CARE_PROVIDER_SITE_OTHER): Payer: Medicare Other | Admitting: Urology

## 2017-10-21 VITALS — BP 112/77 | HR 114 | Ht 70.0 in | Wt 170.0 lb

## 2017-10-21 DIAGNOSIS — N390 Urinary tract infection, site not specified: Secondary | ICD-10-CM

## 2017-10-21 LAB — URINALYSIS, COMPLETE
Bilirubin, UA: NEGATIVE
Glucose, UA: NEGATIVE
Ketones, UA: NEGATIVE
Leukocytes, UA: NEGATIVE
NITRITE UA: NEGATIVE
PH UA: 5.5 (ref 5.0–7.5)
Protein, UA: NEGATIVE
RBC, UA: NEGATIVE
Specific Gravity, UA: 1.02 (ref 1.005–1.030)
Urobilinogen, Ur: 0.2 mg/dL (ref 0.2–1.0)

## 2017-10-21 LAB — MICROSCOPIC EXAMINATION: WBC UA: NONE SEEN /HPF (ref 0–5)

## 2017-10-21 NOTE — Progress Notes (Signed)
10/21/2017 2:46 PM   Russell Howard 10/09/1934 [id number]  Referring provider: [name], MD [place] [facility] [place], [place] [address]  Chief Complaint  Patient presents with  . Follow-up    HPI: Patient is a 83 year old Caucasian male who is referred to [place] by [facility] for recurrent urinary tract infections with his daughter, Russell Howard.    Over the past year, he has been hospitalized with UTI's four times.    Reviewing his records,  he has had: + Staphylococcus epidermidis resistant to ciprofloxacin, clindamycin and oxacillin on November 17, 2016 Multiple species in March 2019 + MRSA in 05/22/2017  He is currently on Keflex for an UTI.  Urine culture from the 10/17/2017 ED visit and admission was negative.    His symptoms with a urinary tract infection consist of confusion, weakness and malaise.  He states these symptoms come and go intermittently.  He states that yesterday, he did not have any of these symptoms and felt good.    He is having intermittency, hesitancy and straining to urinate.  Patient denies any dysuria or suprapubic/flank pain.   He has a history of a stricture and retention which he manages with CIC 3 to 4 times daily with residuals of 200cc to 400cc.    He is using a new catheter with every cathing.  He does have bleeding from time to time with the cathing.  He was started on Hiprex in 2017.    He underwent a cystoscopy in 02/2017 and no malignancy or nidus for infection was seen.  Contrast CT in 05/2017 noted adrenal glands are within normal limits.  Kidneys show no hydronephrosis. 9 mm posterior and 7 mm anterior mid right renal indeterminate lesions are again noted. Urinary bladder slightly lobulated in contour but without focal wall thickening.  He is having normal bowel movements every other day.    He is drinking one jug (from hospital) of water daily.  He is drinking milk with every meal.  He is also drinking V8.  He is taking  cranberry supplements that he started two weeks ago.  He is not drinking any coffee.  He is drinking OJ.  He does not drink sodas.  He has sweet tea when he goes out to week several times of week.  He is taking methenamine twice daily.     PMH: Past Medical History:  Diagnosis Date  . Acquired hypothyroidism 11/02/2014  . BPH (benign prostatic hyperplasia)   . Cardiac defibrillator in place 11/02/2014  . Cardiomyopathy ([place])   . CHF (congestive heart failure) ([place])   . Chronic diastolic heart failure ([place]) 11/02/2014  . COPD (chronic obstructive pulmonary disease) ([place])   . Gastro-esophageal reflux disease without esophagitis 11/02/2014  . H/O malignant neoplasm 11/09/2014  . Heart valve disease 11/02/2014  . History of atrial fibrillation 11/02/2014  . Lung cancer ([place])   . Neuropathy 11/02/2014  . Orthostasis 11/02/2014  . Pure hypercholesterolemia 11/02/2014  . Valvular heart disease     Surgical History: Past Surgical History:  Procedure Laterality Date  . APPENDECTOMY    . CARDIAC VALVE REPLACEMENT     with a bovine valve  . DUAL ICD IMPLANT  2007/2009   implantable cardiac defibrillator  . PORT-A-CATH REMOVAL      Home Medications:  Allergies as of 10/21/2017      Reactions   Penicillin G Hives   Has patient had a PCN reaction causing immediate rash, facial/tongue/throat swelling, SOB or lightheadedness  with hypotension: Yes Has patient had a PCN reaction causing severe rash involving mucus membranes or skin necrosis: No Has patient had a PCN reaction that required hospitalization No Has patient had a PCN reaction occurring within the last 10 years: No If all of the above answers are "NO", then may proceed with Cephalosporin use.   Sulfa Antibiotics Rash      Medication List        Accurate as of 10/21/17  2:46 PM. Always use your most recent med list.          acidophilus Caps capsule Take 1 capsule by mouth every evening.   apixaban 5 MG Tabs tablet Commonly  known as:  ELIQUIS Take 1 tablet (5 mg total) by mouth 2 (two) times daily.   aspirin EC 81 MG tablet Take 81 mg by mouth daily.   cephALEXin 500 MG capsule Commonly known as:  KEFLEX Take 1 capsule (500 mg total) by mouth every 12 (twelve) hours for 3 days.   Cholecalciferol 5000 units Tabs Take 5,000 Units by mouth daily.   Co Q-10 120 MG Caps Take 120 mg by mouth every evening.   digoxin 0.25 MG tablet Commonly known as:  LANOXIN Take 0.25 mg by mouth daily.   gabapentin 600 MG tablet Commonly known as:  NEURONTIN Take 600 mg by mouth 2 (two) times daily.   hydrocortisone 10 MG tablet Commonly known as:  CORTEF Take 10 mg by mouth 2 (two) times daily.   levothyroxine 100 MCG tablet Commonly known as:  SYNTHROID, LEVOTHROID Take 100 mcg by mouth daily before breakfast.   magnesium oxide 400 (241.3 Mg) MG tablet Commonly known as:  MAG-OX Take 400 mg by mouth every evening.   methenamine 1 g tablet Commonly known as:  HIPREX TAKE 1 TABLET BY MOUTH TWICE DAILY WITH MEALS   midodrine 10 MG tablet Commonly known as:  PROAMATINE Take 1 tablet (10 mg total) by mouth 3 (three) times daily as needed. If systolic BP <086   multivitamin with minerals Tabs tablet Take 1 tablet by mouth daily.   pantoprazole 40 MG tablet Commonly known as:  PROTONIX Take 40 mg by mouth daily before breakfast.   saw palmetto 500 MG capsule Take 1,000 mg by mouth every evening.   simvastatin 20 MG tablet Commonly known as:  ZOCOR Take 20 mg by mouth every evening.   vitamin B-12 500 MCG tablet Commonly known as:  CYANOCOBALAMIN Take 500 mcg by mouth daily.   vitamin C 1000 MG tablet Take 500 mg by mouth 2 (two) times daily.   vitamin E 400 UNIT capsule Take 400 Units by mouth every evening.       Allergies:  Allergies  Allergen Reactions  . Penicillin G Hives    Has patient had a PCN reaction causing immediate rash, facial/tongue/throat swelling, SOB or lightheadedness  with hypotension: Yes Has patient had a PCN reaction causing severe rash involving mucus membranes or skin necrosis: No Has patient had a PCN reaction that required hospitalization No Has patient had a PCN reaction occurring within the last 10 years: No If all of the above answers are "NO", then may proceed with Cephalosporin use.  . Sulfa Antibiotics Rash    Family History: Family History  Problem Relation Age of Onset  . Hypertension Unknown   . Heart disease Unknown   . CAD Father     Social History:  reports that he quit smoking about 35 years ago. His smoking use  included cigarettes. He has never used smokeless tobacco. He reports that he does not drink alcohol or use drugs.  ROS: UROLOGY Frequent Urination?: No Hard to postpone urination?: No Burning/pain with urination?: No Get up at night to urinate?: No Leakage of urine?: No Urine stream starts and stops?: Yes Trouble starting stream?: Yes Do you have to strain to urinate?: Yes Blood in urine?: No Urinary tract infection?: Yes Sexually transmitted disease?: No Injury to kidneys or bladder?: No Painful intercourse?: No Weak stream?: No Erection problems?: No Penile pain?: No  Gastrointestinal Nausea?: No Vomiting?: Yes Indigestion/heartburn?: No Diarrhea?: No Constipation?: No  Constitutional Fever: No Night sweats?: No Weight loss?: No Fatigue?: Yes  Skin Skin rash/lesions?: No Itching?: No  Eyes Blurred vision?: No Double vision?: No  Ears/Nose/Throat Sore throat?: No Sinus problems?: Yes  Hematologic/Lymphatic Swollen glands?: No Easy bruising?: No  Cardiovascular Leg swelling?: No Chest pain?: No  Respiratory Cough?: Yes Shortness of breath?: Yes  Endocrine Excessive thirst?: No  Musculoskeletal Back pain?: No Joint pain?: No  Neurological Headaches?: Yes Dizziness?: Yes  Psychologic Depression?: No Anxiety?: No  Physical Exam: BP 112/77 (BP Location: Right Leg,  Patient Position: Sitting, Cuff Size: Large)   Pulse (!) 114   Ht 5\' 10"  (1.778 m)   Wt 170 lb (77.1 kg) Comment: stated  BMI 24.39 kg/m   Constitutional:  Well nourished. Alert and oriented, No acute distress. HEENT: [place] AT, moist mucus membranes.  Trachea midline, no masses. Cardiovascular: No clubbing, cyanosis, or edema. Respiratory: Normal respiratory effort, no increased work of breathing. GI: Abdomen is soft, non tender, non distended, no abdominal masses. Liver and spleen not palpable.  No hernias appreciated.  Stool sample for occult testing is not indicated.   GU: No CVA tenderness.  No bladder fullness or masses.  Patient with circumcised phallus.  Urethral meatus is patent.  No penile discharge. No penile lesions or rashes. Scrotum without lesions, cysts, rashes and/or edema.  Testicles are located scrotally bilaterally. No masses are appreciated in the testicles. Left and right epididymis are normal. Rectal: Patient with  normal sphincter tone. Anus and perineum without scarring or rashes. No rectal masses are appreciated. Prostate is approximately 50 grams, no nodules are appreciated. Seminal vesicles are normal. Skin: No rashes, bruises or suspicious lesions. Lymph: No cervical or inguinal adenopathy. Neurologic: Grossly intact, no focal deficits, moving all 4 extremities. Psychiatric: Normal mood and affect.  Laboratory Data: Lab Results  Component Value Date   WBC 9.3 10/20/2017   HGB 13.7 10/20/2017   HCT 40.6 10/20/2017   MCV 89.9 10/20/2017   PLT 178 10/20/2017    Lab Results  Component Value Date   CREATININE 0.96 10/20/2017    No results found for: PSA  No results found for: TESTOSTERONE  Lab Results  Component Value Date   HGBA1C 7.4 (H) 08/19/2016    Lab Results  Component Value Date   TSH 2.678 05/22/2017    No results found for: CHOL, HDL, CHOLHDL, VLDL, LDLCALC  Lab Results  Component Value Date   AST 31 10/17/2017   Lab Results    Component Value Date   ALT 17 10/17/2017   No components found for: ALKALINEPHOPHATASE No components found for: BILIRUBINTOTAL  No results found for: ESTRADIOL  Urinalysis CATH UA was Negative.  See Epic.   I have reviewed the labs.  Procedure In and Out Catheterization Patient was cleaned and prepped in a sterile fashion with betadine and Lidocaine 2% jelly was instilled into  the urethra.  A 14 FR cath was inserted no complications were noted , 70 ml of urine return was noted, urine was dark yellow in color. A clean urine sample was collected for UA. Bladder was drained  And catheter was removed with out difficulty.    Preformed by: Zara Council, PA-C     Assessment & Plan:    1. rUTI Continue Hiprex and probiotics Expressed my concern regarding the paucity of positive urine cultures and the numerous hospitalizations that included the diagnosis of UTIs Reviewed that he is likely chronically colonized due to his CIC and with him not having typical UTI symptoms they should not stop at the diagnosis of UTI when he is feeling poorly and seek evaluation of other systems (cardiac, pulmonary, neuro, etc.) for a cause of his symptoms We will also ask ID to evaluate - offered referral to [facility], but they would like to wait to see ID in [facility]    Return for patient to go on ID waiting list .  These notes generated with voice recognition software. I apologize for typographical errors.  Zara Council, PA-C  [facility] [address]  [place], [place] [address] [phone]

## 2017-10-22 ENCOUNTER — Ambulatory Visit (INDEPENDENT_AMBULATORY_CARE_PROVIDER_SITE_OTHER): Payer: Medicare Other | Admitting: Pulmonary Disease

## 2017-10-22 ENCOUNTER — Encounter: Payer: Self-pay | Admitting: Pulmonary Disease

## 2017-10-22 VITALS — BP 130/78 | HR 107 | Ht 70.0 in | Wt 170.0 lb

## 2017-10-22 DIAGNOSIS — J701 Chronic and other pulmonary manifestations due to radiation: Secondary | ICD-10-CM

## 2017-10-22 DIAGNOSIS — J9611 Chronic respiratory failure with hypoxia: Secondary | ICD-10-CM

## 2017-10-22 DIAGNOSIS — Z85118 Personal history of other malignant neoplasm of bronchus and lung: Secondary | ICD-10-CM | POA: Diagnosis not present

## 2017-10-22 DIAGNOSIS — Z86711 Personal history of pulmonary embolism: Secondary | ICD-10-CM | POA: Diagnosis not present

## 2017-10-22 NOTE — Patient Instructions (Signed)
Continue oxygen therapy is close to 24 hours/day as possible.  You may adjust the flow rate to maintain oxygen saturations >90%  Continue nebulized DuoNeb as needed for increased shortness of breath, wheezing, chest tightness, cough  Follow-up in 3 to 4 months.  Call sooner if needed

## 2017-10-22 NOTE — Progress Notes (Signed)
PULMONARY OFFICE FOLLOW NOTE   PROBLEMS:  History of lung cancer, S/P XRT to R lung 2015 Extensive R lung radiation fibrosis Chronic hypoxemic respiratory failure Hospitalized 01/28-01/31/18 for severe sepsis and presumed PNA  DATA/EVENTS: PET 03/02/15: No findings suspicious for local recurrence or definite metastatic disease CT chest 11/10/15: Persistent volume loss, prominent fibrosis, bronchiectasis and interstitial infiltrates in the right upper lobe, with similar but less severe changes in the remaining of the right lung parenchyma. Increasing perifissural and peripleural nodular and linear interstitial thickening in the left lung. CT chest 05/23/17: Large chronic R pulmonary embolism occlusive in RUL. At least 50% stenosis R main PA. Ground-glass centrilobular nodules LLL may be infectious or inflammatory. Mildly worsening hilar lymphadenopathy. Similar extensive fibrosis with bronchiectasis seen with chronic interstitial lung disease. Chronic R moderate pleural effusion (reviewed by me)  INTERVAL HISTORY: Hospitalized 9/14-9/17 with urinary tract infection  SUBJ: This is a scheduled return visit. He states that he is "pretty much the same". Denies new respiratory complaints. Denies CP, fever, purulent sputum, hemoptysis, LE edema and calf tenderness.   OBJ: Vitals:   10/22/17 1501  BP: 130/78  Pulse: (!) 107  SpO2: 93%  Weight: 170 lb (77.1 kg)  Height: 5\' 10"  (1.778 m)  2 LPM [place] pulse  Gen: NAD in WC wearing [place] HEENT: NCAT, sclera white Neck: No JVD Lungs: breath sounds nearly absent on R, no wheezes or other adventitious sounds Cardiovascular: RRR, no murmurs Abdomen: Soft, nontender, normal BS Ext: without clubbing, cyanosis, edema Neuro: grossly intact Skin: Limited exam, no lesions noted    DATA: BMP Latest Ref Rng & Units 10/20/2017 10/19/2017 10/18/2017  Glucose 70 - 99 mg/dL 145(H) 137(H) 148(H)  BUN 8 - 23 mg/dL 23 21 23   Creatinine 0.61 - 1.24 mg/dL 0.96  1.05 1.03  Sodium 135 - 145 mmol/L 135 136 139  Potassium 3.5 - 5.1 mmol/L 4.4 4.4 5.1  Chloride 98 - 111 mmol/L 100 100 102  CO2 22 - 32 mmol/L 28 27 28   Calcium 8.9 - 10.3 mg/dL 9.2 9.0 9.2    CBC Latest Ref Rng & Units 10/20/2017 10/19/2017 10/18/2017  WBC 3.8 - 10.6 K/uL 9.3 10.3 10.1  Hemoglobin 13.0 - 18.0 g/dL 13.7 14.0 14.1  Hematocrit 40.0 - 52.0 % 40.6 42.0 41.5  Platelets 150 - 440 K/uL 178 165 185    CXR 9/14: No significant change extensive opacification of right hemithorax  IMPRESSION: Chronic hypoxemic respiratory failure (HCC)  History of lung cancer  History of pulmonary embolism  Extensive radiation fibrosis of right lung ([place])   PLAN: Continue oxygen therapy is close to 24 hours/day as possible. Adjust the flow rate to maintain oxygen saturations >90%  Continue nebulized DuoNeb as needed for increased shortness of breath, wheezing, chest tightness, cough  Follow-up in 3 to 4 months.  Call sooner if needed  Merton Border, MD PCCM service Mobile [phone] Pager [phone] 10/22/2017 3:08 PM

## 2017-10-25 ENCOUNTER — Encounter: Payer: Self-pay | Admitting: Urology

## 2017-10-26 ENCOUNTER — Encounter: Payer: Self-pay | Admitting: Pulmonary Disease

## 2017-11-17 ENCOUNTER — Other Ambulatory Visit: Payer: Self-pay | Admitting: Urology

## 2017-11-17 DIAGNOSIS — N390 Urinary tract infection, site not specified: Secondary | ICD-10-CM

## 2017-12-21 ENCOUNTER — Telehealth: Payer: Self-pay | Admitting: Urology

## 2017-12-21 NOTE — Telephone Encounter (Signed)
Would you check to see if patient has been seen by ID?

## 2017-12-21 NOTE — Telephone Encounter (Signed)
Patient has not been seen by ID, is there anything I need to do to assist in this referral? Thanks

## 2017-12-22 ENCOUNTER — Other Ambulatory Visit: Payer: Self-pay | Admitting: Urology

## 2017-12-22 DIAGNOSIS — N390 Urinary tract infection, site not specified: Secondary | ICD-10-CM

## 2017-12-22 NOTE — Telephone Encounter (Signed)
Patient has never had a referral to ID so that's why he never got an app. I checked his chart and there was never on placed. A referral needs to be put in.   Sharyn Lull

## 2017-12-22 NOTE — Progress Notes (Signed)
Referral for ID is in the chart.

## 2017-12-29 ENCOUNTER — Ambulatory Visit: Payer: Medicare Other | Attending: Infectious Diseases | Admitting: Infectious Diseases

## 2017-12-29 VITALS — BP 118/62 | HR 84 | Wt 170.0 lb

## 2017-12-29 DIAGNOSIS — N390 Urinary tract infection, site not specified: Secondary | ICD-10-CM

## 2017-12-29 DIAGNOSIS — I5032 Chronic diastolic (congestive) heart failure: Secondary | ICD-10-CM

## 2017-12-29 DIAGNOSIS — I4891 Unspecified atrial fibrillation: Secondary | ICD-10-CM

## 2017-12-29 DIAGNOSIS — R110 Nausea: Secondary | ICD-10-CM | POA: Diagnosis not present

## 2017-12-29 DIAGNOSIS — Z7901 Long term (current) use of anticoagulants: Secondary | ICD-10-CM

## 2017-12-29 DIAGNOSIS — Z85118 Personal history of other malignant neoplasm of bronchus and lung: Secondary | ICD-10-CM

## 2017-12-29 DIAGNOSIS — Z9981 Dependence on supplemental oxygen: Secondary | ICD-10-CM

## 2017-12-29 DIAGNOSIS — F329 Major depressive disorder, single episode, unspecified: Secondary | ICD-10-CM

## 2017-12-29 DIAGNOSIS — R42 Dizziness and giddiness: Secondary | ICD-10-CM

## 2017-12-29 DIAGNOSIS — Z923 Personal history of irradiation: Secondary | ICD-10-CM

## 2017-12-29 DIAGNOSIS — Z79899 Other long term (current) drug therapy: Secondary | ICD-10-CM

## 2017-12-29 DIAGNOSIS — E079 Disorder of thyroid, unspecified: Secondary | ICD-10-CM

## 2017-12-29 DIAGNOSIS — J849 Interstitial pulmonary disease, unspecified: Secondary | ICD-10-CM

## 2017-12-29 DIAGNOSIS — Z881 Allergy status to other antibiotic agents status: Secondary | ICD-10-CM

## 2017-12-29 DIAGNOSIS — Z87440 Personal history of urinary (tract) infections: Secondary | ICD-10-CM | POA: Diagnosis not present

## 2017-12-29 DIAGNOSIS — I951 Orthostatic hypotension: Secondary | ICD-10-CM

## 2017-12-29 DIAGNOSIS — N400 Enlarged prostate without lower urinary tract symptoms: Secondary | ICD-10-CM

## 2017-12-29 DIAGNOSIS — Z952 Presence of prosthetic heart valve: Secondary | ICD-10-CM

## 2017-12-29 DIAGNOSIS — Z95810 Presence of automatic (implantable) cardiac defibrillator: Secondary | ICD-10-CM

## 2017-12-29 DIAGNOSIS — I2782 Chronic pulmonary embolism: Secondary | ICD-10-CM

## 2017-12-29 DIAGNOSIS — Z880 Allergy status to penicillin: Secondary | ICD-10-CM

## 2017-12-29 DIAGNOSIS — Z87891 Personal history of nicotine dependence: Secondary | ICD-10-CM

## 2017-12-29 DIAGNOSIS — J449 Chronic obstructive pulmonary disease, unspecified: Secondary | ICD-10-CM

## 2017-12-29 DIAGNOSIS — Z8614 Personal history of Methicillin resistant Staphylococcus aureus infection: Secondary | ICD-10-CM

## 2017-12-29 DIAGNOSIS — Z872 Personal history of diseases of the skin and subcutaneous tissue: Secondary | ICD-10-CM

## 2017-12-29 DIAGNOSIS — R5383 Other fatigue: Secondary | ICD-10-CM | POA: Diagnosis not present

## 2017-12-29 LAB — POCT URINALYSIS DIPSTICK OB
BILIRUBIN UA: NEGATIVE
Glucose, UA: NEGATIVE
Ketones, UA: NEGATIVE
Nitrite, UA: NEGATIVE
PH UA: 6.5 (ref 5.0–8.0)
POC,PROTEIN,UA: NEGATIVE
Spec Grav, UA: 1.015 (ref 1.010–1.025)
UROBILINOGEN UA: 0.2 U/dL

## 2017-12-29 NOTE — Progress Notes (Signed)
NAME: Russell Howard  DOB: Jun 25, 1934  MRN: [id number]  Date/Time: 12/29/2017 9:15 AM Subjective:  REASON FOR CONSULT: recurrent UTI ? Russell Howard is a 83 y.o.male  with a history of BPH, self catheterization, Afib, AICD, aortic valve replacement, CHF, COPD, Ca lung is here to see me for recurrent UTI. Pt has dizziness, fatigue and nausea which are constant with some days of relief and other days when it is worse. This has been present for many months. Today is not a good day. He has nausea but no vomiting, fatigue and feeling dizzy. His daughter says he was told that these are symptoms of UTI and they have been given keflex and other antibiotics in the past. His last keflex was 11/26/17-12/06/17 for 10 days. On 11/26/17 urine culture had 10K-25K urogenital mixed flora. Some perceived improvement with keflex but not ongoing. In September he was in the hospital between 9/14-9/17 for nausea and vomiting and UA showed some leucoesterase but UC negative. He was treated with IV fluids and Antibiotics and was told it could be UTI when clearly the urine culture was negative. The past 4 times his urine culture has been negative other than in April when he had MRSA .one other time last year he had staph epi both being skin bacteria and contaminating the urine.He has never had e.coli or gram neg rod in the urine He also has orthostasis and takes midodrine. He is on digoxin. He has no fever, chills, abdominal pain, hematuria.  Base line he has restricted mobility- walks with a walker- sits in chair most of the time He lives with his daughter and they both are clearly frustrated that his symptoms are not getting better and neither do we have an answer for his symptoms- He is on home oxygen for a combination of lung disease following  Radiation fibrosis  to   R lung for lung cancer in 2015, chronic PE RUL   CHF, copd, interstitial lung disease-  He is followed by pulmonary, Cardiology , urology, PCP   Medical  history CHF (congestive heart failure), NYHA class IV, chronic, diastolic (CMS-HCC)  . Valvular heart disease  . Cardiomyopathy (CMS-HCC)  . 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 History of atrial fibrillation  . HTN, . Pure hypercholesterolemia  . Neuropathy  . Acquired hypothyroidism, unspecified  . Diabetes mellitus without complication (CMS-HCC)  . GERD without esophagitis  . Orthostasis  . History of lung cancer     Past Surgical History:  Procedure Laterality Date  . APPENDECTOMY    . CARDIAC VALVE REPLACEMENT     with a bovine valve  . DUAL ICD IMPLANT  2007/2009   implantable cardiac defibrillator  . PORT-A-CATH REMOVAL     SH Lived in [place] until he moved here Lives with his daughter now Former smoker   Family History  Problem Relation Age of Onset  . Hypertension Unknown   . Heart disease Unknown   . CAD Father    Allergies  Allergen Reactions  . Penicillin G Hives    Has patient had a PCN reaction causing immediate rash, facial/tongue/throat swelling, SOB or lightheadedness with hypotension: Yes Has patient had a PCN reaction causing severe rash involving mucus membranes or skin necrosis: No Has patient had a PCN reaction that required hospitalization No Has patient had a PCN reaction occurring within the last 10 years: No If all of the above answers are "NO", then may proceed with Cephalosporin use.  . Sulfa Antibiotics Rash  ?  Current Outpatient Medications  Medication Sig Dispense Refill  . acidophilus (RISAQUAD) CAPS capsule Take 1 capsule by mouth every evening.     Marland Kitchen apixaban (ELIQUIS) 5 MG TABS tablet Take 1 tablet (5 mg total) by mouth 2 (two) times daily. 60 tablet 2  . Ascorbic Acid (VITAMIN C) 1000 MG tablet Take 500 mg by mouth 2 (two) times daily.     Marland Kitchen aspirin EC 81 MG tablet Take 81 mg by mouth daily.     . Cholecalciferol 5000 units TABS Take 5,000 Units by mouth daily.    . Coenzyme Q10 (CO Q-10) 120 MG CAPS Take  120 mg by mouth every evening.    . digoxin (LANOXIN) 0.25 MG tablet Take 0.25 mg by mouth daily.     Marland Kitchen gabapentin (NEURONTIN) 600 MG tablet Take 600 mg by mouth 2 (two) times daily.    . hydrocortisone (CORTEF) 10 MG tablet Take 10 mg by mouth 2 (two) times daily.     Marland Kitchen levothyroxine (SYNTHROID, LEVOTHROID) 100 MCG tablet Take 100 mcg by mouth daily before breakfast.    . magnesium oxide (MAG-OX) 400 (241.3 Mg) MG tablet Take 400 mg by mouth every evening.    . methenamine (HIPREX) 1 g tablet TAKE 1 TABLET BY MOUTH TWICE DAILY WITH MEALS 60 tablet 3  . midodrine (PROAMATINE) 10 MG tablet Take 1 tablet (10 mg total) by mouth 3 (three) times daily as needed. If systolic BP <270 90 tablet 0  . Multiple Vitamin (MULTIVITAMIN WITH MINERALS) TABS tablet Take 1 tablet by mouth daily.    . pantoprazole (PROTONIX) 40 MG tablet Take 40 mg by mouth daily before breakfast.     . saw palmetto 500 MG capsule Take 1,000 mg by mouth every evening.    . simvastatin (ZOCOR) 20 MG tablet Take 20 mg by mouth every evening.     . vitamin B-12 (CYANOCOBALAMIN) 500 MCG tablet Take 500 mcg by mouth daily.     . vitamin E 400 UNIT capsule Take 400 Units by mouth every evening.      No current facility-administered medications for this visit.      Abtx:  Anti-infectives (From admission, onward)   None      REVIEW OF SYSTEMS:  Const: negative fever, negative chills, negative weight loss Eyes: negative diplopia or visual changes, negative eye pain ENT: negative coryza, negative sore throat Resp:  cough, no hemoptysis, has dyspnea Cards: negative for chest pain, palpitations, lower extremity edema GU: self catheterization 3 times a day GI: Negative for abdominal pain, diarrhea, bleeding, has constipation Skin: negative for rash and pruritus Heme: negative for easy bruising and gum/nose bleeding MS: negative for myalgias, arthralgias, back pain and has muscle weakness Neurolo:negative for headaches,  Has  dizziness, vertigo, memory problems  Psych:  feelings of  depression  Endocrine: thyroid issues Allergy/Immunology- penicillin and bactrim allergy Objective:  VITALS:  BP 118/62 (BP Location: Left Arm, Patient Position: Sitting, Cuff Size: Normal)   Pulse 84   Wt 170 lb (77.1 kg)   SpO2 (!) 87%   BMI 24.39 kg/m  PHYSICAL EXAM:  General: Alert, flat affect , weak cooperative, is nauseous and is sitting with a bowl but no vomiting Head: Normocephalic, without obvious abnormality, atraumatic. Eyes: Conjunctivae clear, anicteric sclerae. Pupils are equal ENT Nares normal. No drainage or sinus tenderness. Lips, mucosa, and tongue dry l. No Thrush Neck: Supple, symmetrical, no adenopathy, thyroid: non tender no carotid bruit and no JVD. Back: No CVA  tenderness. Lungs: b/l air entry- tubular breathing rt , coarse crepts  Heart: s1s2 irregular AICD site fine Abdomen: Soft, non-tender,not distended. Bowel sounds normal. No masses Extremities: atraumatic, no cyanosis. No edema. No clubbing Skin: easy bruising Lymph: Cervical, supraclavicular normal. Neurologic: Grossly non-focal He did catheterize himself and had 170 ml of urine since his last time which was after midnight Pertinent Labs Lab Results CBC    Component Value Date/Time   WBC 9.3 10/20/2017 0303   RBC 4.52 10/20/2017 0303   HGB 13.7 10/20/2017 0303   HCT 40.6 10/20/2017 0303   PLT 178 10/20/2017 0303   MCV 89.9 10/20/2017 0303   MCH 30.3 10/20/2017 0303   MCHC 33.7 10/20/2017 0303   RDW 15.5 (H) 10/20/2017 0303   LYMPHSABS 0.6 (L) 06/21/2017 1045   MONOABS 0.9 06/21/2017 1045   EOSABS 0.0 06/21/2017 1045   BASOSABS 0.0 06/21/2017 1045    CMP Latest Ref Rng & Units 10/20/2017 10/19/2017 10/18/2017  Glucose 70 - 99 mg/dL 145(H) 137(H) 148(H)  BUN 8 - 23 mg/dL 23 21 23   Creatinine 0.61 - 1.24 mg/dL 0.96 1.05 1.03  Sodium 135 - 145 mmol/L 135 136 139  Potassium 3.5 - 5.1 mmol/L 4.4 4.4 5.1  Chloride 98 - 111 mmol/L  100 100 102  CO2 22 - 32 mmol/L 28 27 28   Calcium 8.9 - 10.3 mg/dL 9.2 9.0 9.2  Total Protein 6.5 - 8.1 g/dL - - -  Total Bilirubin 0.3 - 1.2 mg/dL - - -  Alkaline Phos 38 - 126 U/L - - -  AST 15 - 41 U/L - - -  ALT 0 - 44 U/L - - -      Microbiology: Reviewed micro results from sept and beforeIMAGING RESULTS: ? Impression/Recommendation ?83 y.o.male  with a history of BPH, self catheterization, Afib, AICD, aortic valve replacement, CHF, COPD, Ca lung is here to see me for recurrent UTI. Pt has dizziness, fatigue and nausea which are constant with some days of relief and other days when it is worse. This has been present for many months. Today is not a good day. He has nausea but no vomiting, fatigue and feeling dizzy. His daughter says he was told that these are symptoms of UTI and they have been given keflex and other antibiotics in the past. His last keflex was 11/26/17-12/06/17 for 10 days. On 11/26/17 urine culture had 10K-25K urogenital mixed flora. Some perceived improvement with keflex but not ongoing.   Nausea, fatigue and dizziness for many months- this is not UTI- This could be autonomic dysfunction or with orthostasis need to rule out adrenal insufficiency.There is an element of dehydration as evidenced by an output of 145ml in 10 hours He is currently on midodrine and hydrocortisone- may need to refer to endocrinology to test for adrenal insufficiency- may have to switch cortisone to other steroid like decadron for testing.   Also may have gastroparesis which could explain nausea Need to r/o digoxin side effect He has many medical comorbidities-cardiac and pulmonary  which could explain his general deconditioning and that can cause his symptoms  BPH- with CIC- has been on Hiprex- seen urology  CHF, his last echo this year showed a EF of 45%  AFIB on digoxin and eliquis  Aortic valve replacement  Ca lung with radiation changes to rt lung Chronic PE- rt Ul   ?Daughter  and patient were convinced that his symptoms are due to UTI as they had been told in the past by  many physicians and said that when he was in the hospital in [place] he got better with IV antibiotics- I had to explain to them that it may have been  IV fluids that made him better as he was dehydrated and had increase in creatinine and his WBC was also increased due to hemoconcentration. Also told them that his urine culture was negative.  I can understand their frustration of not having an answer to his symptoms but the UA findings are  a distraction  from pursuing a real etiology  I recommended to him to increase his fluid intake and also to use salt liberally. I told them antibiotics are to be reserved only for a true infection. He is expected to have wbc in his urine because of self catheterization. He is expected to have low level bacteria in the urine- In fact I am surprised that he has not had any gram neg bacteria but he  has been following a clean  technique of CIC  as much as possible.  I recommended that he go to ED /or call his PCP if his symptoms of nausea and dizziness  get worse   ___________________________________________________ Discussed with patient and his daughter in great detail. Will discuss with his PCP

## 2017-12-29 NOTE — Patient Instructions (Addendum)
You are referred to me for possible UTI- you have been tired with dizziness You also have nausea You self catheterize. Your last 4 urine culture have not grown any bacteria . You have postural hypotension which can cause these symptoms and you take midodrine- recommend taking some salt with lot of water and other fluids Also checking for gastroparesis can help. Checking the level of digoxin will help Would not recommend antibiotics if there is no bacteria in the urine and also most of your symptoms are not related to UTI .you have cardiac and pulmonary issues and you are on home oxygen . Please check with your PCP. If your nausea and fatigue is not getting better recommenf going to PCP/urgicare

## 2018-01-15 ENCOUNTER — Inpatient Hospital Stay
Admission: EM | Admit: 2018-01-15 | Discharge: 2018-01-18 | DRG: 312 | Disposition: A | Discharge status: Home Health | Payer: Medicare Other | Attending: Internal Medicine | Admitting: Internal Medicine

## 2018-01-15 ENCOUNTER — Emergency Department: Payer: Medicare Other

## 2018-01-15 ENCOUNTER — Other Ambulatory Visit: Payer: Self-pay

## 2018-01-15 ENCOUNTER — Other Ambulatory Visit: Payer: Medicare Other

## 2018-01-15 DIAGNOSIS — J9610 Chronic respiratory failure, unspecified whether with hypoxia or hypercapnia: Secondary | ICD-10-CM | POA: Diagnosis present

## 2018-01-15 DIAGNOSIS — N400 Enlarged prostate without lower urinary tract symptoms: Secondary | ICD-10-CM | POA: Diagnosis present

## 2018-01-15 DIAGNOSIS — J449 Chronic obstructive pulmonary disease, unspecified: Secondary | ICD-10-CM | POA: Diagnosis present

## 2018-01-15 DIAGNOSIS — Z8249 Family history of ischemic heart disease and other diseases of the circulatory system: Secondary | ICD-10-CM

## 2018-01-15 DIAGNOSIS — Z880 Allergy status to penicillin: Secondary | ICD-10-CM

## 2018-01-15 DIAGNOSIS — I5042 Chronic combined systolic (congestive) and diastolic (congestive) heart failure: Secondary | ICD-10-CM | POA: Diagnosis present

## 2018-01-15 DIAGNOSIS — E860 Dehydration: Secondary | ICD-10-CM | POA: Diagnosis present

## 2018-01-15 DIAGNOSIS — Z87891 Personal history of nicotine dependence: Secondary | ICD-10-CM

## 2018-01-15 DIAGNOSIS — Z7982 Long term (current) use of aspirin: Secondary | ICD-10-CM | POA: Diagnosis not present

## 2018-01-15 DIAGNOSIS — Z9981 Dependence on supplemental oxygen: Secondary | ICD-10-CM

## 2018-01-15 DIAGNOSIS — Z515 Encounter for palliative care: Secondary | ICD-10-CM | POA: Diagnosis present

## 2018-01-15 DIAGNOSIS — N390 Urinary tract infection, site not specified: Secondary | ICD-10-CM

## 2018-01-15 DIAGNOSIS — N3000 Acute cystitis without hematuria: Secondary | ICD-10-CM | POA: Diagnosis present

## 2018-01-15 DIAGNOSIS — E039 Hypothyroidism, unspecified: Secondary | ICD-10-CM | POA: Diagnosis present

## 2018-01-15 DIAGNOSIS — Z882 Allergy status to sulfonamides status: Secondary | ICD-10-CM | POA: Diagnosis not present

## 2018-01-15 DIAGNOSIS — I951 Orthostatic hypotension: Secondary | ICD-10-CM | POA: Diagnosis present

## 2018-01-15 DIAGNOSIS — Z7901 Long term (current) use of anticoagulants: Secondary | ICD-10-CM

## 2018-01-15 DIAGNOSIS — R627 Adult failure to thrive: Secondary | ICD-10-CM | POA: Diagnosis present

## 2018-01-15 DIAGNOSIS — Z79890 Hormone replacement therapy (postmenopausal): Secondary | ICD-10-CM

## 2018-01-15 DIAGNOSIS — Z85118 Personal history of other malignant neoplasm of bronchus and lung: Secondary | ICD-10-CM

## 2018-01-15 DIAGNOSIS — I4891 Unspecified atrial fibrillation: Secondary | ICD-10-CM | POA: Diagnosis present

## 2018-01-15 DIAGNOSIS — E7800 Pure hypercholesterolemia, unspecified: Secondary | ICD-10-CM | POA: Diagnosis present

## 2018-01-15 DIAGNOSIS — Z95810 Presence of automatic (implantable) cardiac defibrillator: Secondary | ICD-10-CM

## 2018-01-15 DIAGNOSIS — K219 Gastro-esophageal reflux disease without esophagitis: Secondary | ICD-10-CM | POA: Diagnosis present

## 2018-01-15 DIAGNOSIS — Z79899 Other long term (current) drug therapy: Secondary | ICD-10-CM

## 2018-01-15 DIAGNOSIS — E875 Hyperkalemia: Secondary | ICD-10-CM | POA: Diagnosis not present

## 2018-01-15 DIAGNOSIS — G629 Polyneuropathy, unspecified: Secondary | ICD-10-CM | POA: Diagnosis present

## 2018-01-15 DIAGNOSIS — R531 Weakness: Secondary | ICD-10-CM

## 2018-01-15 DIAGNOSIS — Z66 Do not resuscitate: Secondary | ICD-10-CM | POA: Diagnosis present

## 2018-01-15 DIAGNOSIS — I959 Hypotension, unspecified: Secondary | ICD-10-CM | POA: Diagnosis present

## 2018-01-15 LAB — COMPREHENSIVE METABOLIC PANEL
ALT: 14 U/L (ref 0–44)
AST: 25 U/L (ref 15–41)
Albumin: 3.8 g/dL (ref 3.5–5.0)
Alkaline Phosphatase: 83 U/L (ref 38–126)
Anion gap: 11 (ref 5–15)
BUN: 22 mg/dL (ref 8–23)
CO2: 23 mmol/L (ref 22–32)
Calcium: 9 mg/dL (ref 8.9–10.3)
Chloride: 100 mmol/L (ref 98–111)
Creatinine, Ser: 1.07 mg/dL (ref 0.61–1.24)
GFR calc Af Amer: >60 mL/min (ref >60)
GFR calc non Af Amer: >60 mL/min (ref >60)
Glucose, Bld: 89 mg/dL (ref 70–99)
Potassium: 4.3 mmol/L (ref 3.5–5.1)
Sodium: 134 mmol/L — ABNORMAL LOW (ref 135–145)
Total Bilirubin: 0.8 mg/dL (ref 0.3–1.2)
Total Protein: 8 g/dL (ref 6.5–8.1)

## 2018-01-15 LAB — TROPONIN I: Troponin I: <0.03 ng/mL (ref <0.03)

## 2018-01-15 LAB — URINALYSIS, COMPLETE (UACMP) WITH MICROSCOPIC
BACTERIA UA: NONE SEEN
Bilirubin Urine: NEGATIVE
GLUCOSE, UA: NEGATIVE mg/dL
Ketones, ur: 20 mg/dL — AB
Nitrite: NEGATIVE
Protein, ur: NEGATIVE mg/dL
Specific Gravity, Urine: 1.016 (ref 1.005–1.030)
pH: 5 (ref 5.0–8.0)

## 2018-01-15 LAB — CBC WITH DIFFERENTIAL/PLATELET
Abs Immature Granulocytes: 0.05 10*3/uL (ref 0.00–0.07)
BASOS PCT: 0 %
Basophils Absolute: 0 10*3/uL (ref 0.0–0.1)
EOS ABS: 0.1 10*3/uL (ref 0.0–0.5)
Eosinophils Relative: 1 %
HEMATOCRIT: 45.5 % (ref 39.0–52.0)
Hemoglobin: 14.9 g/dL (ref 13.0–17.0)
Immature Granulocytes: 1 %
LYMPHS ABS: 0.7 10*3/uL (ref 0.7–4.0)
Lymphocytes Relative: 11 %
MCH: 29.4 pg (ref 26.0–34.0)
MCHC: 32.7 g/dL (ref 30.0–36.0)
MCV: 89.7 fL (ref 80.0–100.0)
Monocytes Absolute: 1.1 10*3/uL — ABNORMAL HIGH (ref 0.1–1.0)
Monocytes Relative: 16 %
Neutro Abs: 4.7 10*3/uL (ref 1.7–7.7)
Neutrophils Relative %: 71 %
PLATELETS: 172 10*3/uL (ref 150–400)
RBC: 5.07 MIL/uL (ref 4.22–5.81)
RDW: 15 % (ref 11.5–15.5)
WBC: 6.7 10*3/uL (ref 4.0–10.5)
nRBC: 0 % (ref 0.0–0.2)

## 2018-01-15 LAB — INFLUENZA PANEL BY PCR (TYPE A & B)
Influenza A By PCR: NEGATIVE
Influenza B By PCR: NEGATIVE

## 2018-01-15 LAB — LACTIC ACID, PLASMA
Lactic Acid, Venous: 1.3 mmol/L (ref 0.5–1.9)
Lactic Acid, Venous: 1.2 mmol/L (ref 0.5–1.9)

## 2018-01-15 MED ORDER — RISAQUAD PO CAPS
1.0000 | ORAL_CAPSULE | Freq: Every evening | ORAL | Status: DC
  Administered 2018-01-15 – 2018-01-17 (×3): 1 via ORAL
  Filled 2018-01-15 (×3): qty 1

## 2018-01-15 MED ORDER — ACETAMINOPHEN 650 MG RE SUPP: 650.0000 mg | Freq: Four times a day (QID) | RECTAL | Status: DC | PRN

## 2018-01-15 MED ORDER — MAGNESIUM OXIDE 400 (241.3 MG) MG PO TABS
400.0000 mg | ORAL_TABLET | Freq: Every evening | ORAL | Status: DC
  Administered 2018-01-16 – 2018-01-17 (×2): 400 mg via ORAL
  Filled 2018-01-15 (×2): qty 1

## 2018-01-15 MED ORDER — HYDROCORTISONE NA SUCCINATE PF 100 MG IJ SOLR
100.0000 mg | Freq: Three times a day (TID) | INTRAMUSCULAR | Status: DC
  Administered 2018-01-15 – 2018-01-18 (×8): 100 mg via INTRAVENOUS
  Filled 2018-01-15 (×9): qty 2

## 2018-01-15 MED ORDER — VITAMIN B-12 1000 MCG PO TABS
500.0000 ug | ORAL_TABLET | Freq: Every day | ORAL | Status: DC
  Administered 2018-01-16 – 2018-01-18 (×3): 500 ug via ORAL
  Filled 2018-01-15 (×3): qty 1

## 2018-01-15 MED ORDER — GUAIFENESIN-DM 100-10 MG/5ML PO SYRP
5.0000 mL | ORAL_SOLUTION | ORAL | Status: DC | PRN
  Administered 2018-01-15: 5 mL via ORAL
  Filled 2018-01-15 (×2): qty 5

## 2018-01-15 MED ORDER — ONDANSETRON HCL 4 MG PO TABS: 4.0000 mg | ORAL_TABLET | Freq: Four times a day (QID) | ORAL | Status: DC | PRN

## 2018-01-15 MED ORDER — APIXABAN 5 MG PO TABS
5.0000 mg | ORAL_TABLET | Freq: Two times a day (BID) | ORAL | Status: DC
  Administered 2018-01-15 – 2018-01-18 (×6): 5 mg via ORAL
  Filled 2018-01-15 (×6): qty 1

## 2018-01-15 MED ORDER — ADULT MULTIVITAMIN W/MINERALS CH
1.0000 | ORAL_TABLET | Freq: Every day | ORAL | Status: DC
  Administered 2018-01-16: 09:00:00 1 via ORAL
  Filled 2018-01-15: qty 1

## 2018-01-15 MED ORDER — DIGOXIN 250 MCG PO TABS
0.2500 mg | ORAL_TABLET | Freq: Every day | ORAL | Status: DC
  Administered 2018-01-16 – 2018-01-18 (×3): 0.25 mg via ORAL
  Filled 2018-01-15 (×3): qty 1

## 2018-01-15 MED ORDER — PANTOPRAZOLE SODIUM 40 MG PO TBEC
40.0000 mg | DELAYED_RELEASE_TABLET | Freq: Every day | ORAL | Status: DC
  Administered 2018-01-16 – 2018-01-18 (×3): 40 mg via ORAL
  Filled 2018-01-15 (×3): qty 1

## 2018-01-15 MED ORDER — ONDANSETRON HCL 4 MG/2ML IJ SOLN: 4.0000 mg | Freq: Four times a day (QID) | INTRAMUSCULAR | Status: DC | PRN

## 2018-01-15 MED ORDER — MIDODRINE HCL 5 MG PO TABS
10.0000 mg | ORAL_TABLET | Freq: Three times a day (TID) | ORAL | Status: DC
  Administered 2018-01-15 – 2018-01-18 (×8): 10 mg via ORAL
  Filled 2018-01-15 (×10): qty 2

## 2018-01-15 MED ORDER — METHENAMINE HIPPURATE 1 G PO TABS: 1.0000 g | ORAL_TABLET | Freq: Two times a day (BID) | ORAL | Status: DC

## 2018-01-15 MED ORDER — GABAPENTIN 300 MG PO CAPS
600.0000 mg | ORAL_CAPSULE | Freq: Two times a day (BID) | ORAL | Status: DC
  Administered 2018-01-15 – 2018-01-18 (×6): 600 mg via ORAL
  Filled 2018-01-15 (×6): qty 2

## 2018-01-15 MED ORDER — LEVOTHYROXINE SODIUM 50 MCG PO TABS
100.0000 ug | ORAL_TABLET | Freq: Every day | ORAL | Status: DC
  Administered 2018-01-17 – 2018-01-18 (×2): 100 ug via ORAL
  Filled 2018-01-15 (×2): qty 2

## 2018-01-15 MED ORDER — ACETAMINOPHEN 325 MG PO TABS: 650.0000 mg | ORAL_TABLET | Freq: Four times a day (QID) | ORAL | Status: DC | PRN

## 2018-01-15 MED ORDER — SODIUM CHLORIDE 0.9 % IV SOLN
1.0000 g | Freq: Once | INTRAVENOUS | Status: AC
  Administered 2018-01-15: 1 g via INTRAVENOUS
  Filled 2018-01-15: qty 10

## 2018-01-15 MED ORDER — SODIUM CHLORIDE 0.9 % IV SOLN
INTRAVENOUS | Status: DC
  Administered 2018-01-15: 21:00:00 via INTRAVENOUS

## 2018-01-15 MED ORDER — SAW PALMETTO (SERENOA REPENS) 500 MG PO CAPS: 1000.0000 mg | ORAL_CAPSULE | Freq: Every evening | ORAL | Status: DC

## 2018-01-15 MED ORDER — SODIUM CHLORIDE 0.9 % IV BOLUS
500.0000 mL | Freq: Once | INTRAVENOUS | Status: AC
  Administered 2018-01-15: 500 mL via INTRAVENOUS

## 2018-01-15 MED ORDER — SODIUM CHLORIDE 0.9 % IV SOLN
1.0000 g | Freq: Two times a day (BID) | INTRAVENOUS | Status: DC
  Administered 2018-01-15 – 2018-01-18 (×6): 1 g via INTRAVENOUS
  Filled 2018-01-15 (×7): qty 1

## 2018-01-15 MED ORDER — GABAPENTIN 600 MG PO TABS: 600.0000 mg | ORAL_TABLET | Freq: Two times a day (BID) | ORAL | Status: DC

## 2018-01-15 NOTE — ED Triage Notes (Signed)
Pt arrives via ems from home where he lives with daughter. Ems report pt failure to thrive, not eating and drinking x 2 days, not taking medications. Home health states pt was positive for orthostatics at home with bp dropping into the 50's. Pt a&o x4 on arrival, asking for daughter. No acute distress noted. Pt denies any pain just feels weak.

## 2018-01-15 NOTE — Progress Notes (Signed)
Pharmacy Antibiotic Note  Russell Howard is a 83 y.o. male admitted on 01/15/2018 with UTI.  Pharmacy has been consulted for Cefepime dosing. Patient initially received Ceftriaxone in the ED but patient does self-caths and MD is concerned about complicated infection.  Plan: Cefepime 1g IV q12h  Height: 5\' 10"  (177.8 cm) Weight: 170 lb (77.1 kg) IBW/kg (Calculated) : 73  Temp (24hrs), Avg:97.9 F (36.6 C), Min:97.9 F (36.6 C), Max:97.9 F (36.6 C)  Recent Labs  Lab 01/15/18 1350 01/15/18 1437  WBC 6.7  --   CREATININE 1.07  --   LATICACIDVEN  --  1.3    Estimated Creatinine Clearance: 56.9 mL/min (by C-G formula based on SCr of 1.07 mg/dL).    Allergies  Allergen Reactions  . Penicillin G Hives    Has patient had a PCN reaction causing immediate rash, facial/tongue/throat swelling, SOB or lightheadedness with hypotension: Yes Has patient had a PCN reaction causing severe rash involving mucus membranes or skin necrosis: No Has patient had a PCN reaction that required hospitalization No Has patient had a PCN reaction occurring within the last 10 years: No If all of the above answers are "NO", then may proceed with Cephalosporin use.  . Sulfa Antibiotics Rash    Antimicrobials this admission: Ceftriaxone 12/13 x1 Cefepime 12/13 >>   Thank you for allowing pharmacy to be a part of this patient's care.  Paulina Fusi, PharmD, BCPS 01/15/2018 7:54 PM

## 2018-01-15 NOTE — ED Provider Notes (Signed)
Signout from Dr. Deboraha Sprang in this 83 year old male with orthostatic hypotension as well as failure to thrive.  Patient with severely decreased appetite and now not taking his medications over the past 2 days.  Plan is to follow-up with urinalysis as well as influenza swab.  Physical Exam  BP 101/75   Pulse (!) 107   Temp 97.9 F (36.6 C) (Oral)   Resp (!) 24   Ht 5\' 10"  (1.778 m)   Wt 77.1 kg   SpO2 96%   BMI 24.39 kg/m   Physical Exam Patient upon reevaluation is resting without any distress.  Able to tolerate a very small amount of soup. ED Course/Procedures     Procedures  MDM  Patient positive for urinary tract infection.  Because of constellation of symptoms and infectious source patient will be admitted to the hospital.  Family as well as patient are aware.  Signed out to Dr. Earleen Newport.         Russell Pyo, MD 01/15/18 [phone]

## 2018-01-15 NOTE — Progress Notes (Signed)
PHARMACIST - PHYSICIAN ORDER COMMUNICATION  CONCERNING: P&T Medication Policy on Herbal Medications  DESCRIPTION:  This patient's order for:  Saw Palmetto  has been noted.  This product(s) is classified as an "herbal" or natural product. Due to a lack of definitive safety studies or FDA approval, nonstandard manufacturing practices, plus the potential risk of unknown drug-drug interactions while on inpatient medications, the Pharmacy and Therapeutics Committee does not permit the use of "herbal" or natural products of this type within [facility].   ACTION TAKEN: The pharmacy department is unable to verify this order at this time and your patient has been informed of this safety policy. Please reevaluate patient's clinical condition at discharge and address if the herbal or natural product(s) should be resumed at that time.

## 2018-01-15 NOTE — ED Notes (Signed)
Pt cleared by MD to eat. Pt daughter brought food for pt. Pt eating and tolerating tomato soup at this time

## 2018-01-15 NOTE — Progress Notes (Addendum)
Patient ID: Russell Howard, male   DOB: [birth year], 83 y.o.   MRN: [id number]  ACP note  patient and daughter present  Diagnosis, hypotension, Acute cystitis, failure to thrive, atrial fibrillation, history of chronic systolic congestive heart failure, COPD with history of lung cancer chronic respiratory failure, hypothyroidism, neuropathy  CODE STATUS discussed and patient is a DNR.  Plan.  With hypotension history of orthostatic hypotension give IV fluids patient is also on Cortef which we will switch to Solu-Cortef and midodrine.  Change antibiotics over to Maxipime because the patient does self catheterizations.  Continue to monitor closely.  Blood pressure high enough to go to the floor.  Time spent on ACP discussion 17 minutes Dr. Loletha Grayer

## 2018-01-15 NOTE — Progress Notes (Addendum)
PATIENT NAME: Russell Howard DOB: [birth year] MRN: [id number]  PRIMARY CARE PROVIDER: [name], MD  RESPONSIBLE PARTY:  Acct ID - Guarantor Home Phone Work Phone Relationship Acct Type  [id number] REN, ASPINALL [phone]  Self P/F     [address], [place], [place] [address]    PLAN OF CARE and INTERVENTIONS:               1.  GOALS OF CARE/ ADVANCE CARE PLANNING:  Patient's goal is to feel better, family wants patient to improve if possible               2.  PATIENT/CAREGIVER EDUCATION:  Discussed early signs of disease decline, early signs of dehydration. Reviewed today's MD appt at 2 pm, patient reports he cant make it because he is too weak. Discussed hospice services and goals for the patient and family                              5.  DISEASE STATUS:Family and patient have identified that disease progression has been very visible the last 3 weeks.  Pt only has approx 2 good days a week where he can get up and engage in family at home.  Daughter in law who is primary caregiver reports decreased oral intake and medication intake for few days, increased weakness over the last week.    HISTORY OF PRESENT ILLNESS:    CODE STATUS:   Code Status: Prior  ADVANCED DIRECTIVES: N MOST FORM:  PPS: 30 %   PHYSICAL EXAM:   VITALS: Today's Vitals   01/15/18 1259 01/15/18 1300  Resp: (!) 23 (!) 25  SpO2: 94% (!) 86%  PainSc: 0-No pain 0-No pain    LUNGS: decreased breath sounds CARDIAC: Cor Tachy EXTREMITIES: negative SKIN: pale, increased cap refill 5 seconds NEURO: positive for dizziness and memory problems   Daughter in law contacted triage to notify palliative care that patient has MD appt  Today for follow up but she is requesting callback and possibly home visit because patient has declined and she is unsure if she can get him in to see MD at 2 pm today.  RN contacted caregiver by phone and had lengthy conversation about options.  RN offered visit.   Upon RN visit,  patient lying in bed grunting/moaning at times but denies pain. Pt reports that he is extremely weak.  Daughter in law reports that patient has been declining for approx 3 weeks.  Rn evaluates that patient has had notable decline since last palliative visit.  Daughter in law reports that within the last few days limited amount of oral intake and medications.  Pt states a couple of times, that he is tired and "ready to be with the Reita Cliche"  Lengthy discussion about patient's goals as well as family's goals with daughter in law and patient.  Patient is unsure at this point. He wants to feel better and "not give up" but doesn't know what to do.  Lengthy discussion about hospice vs. palliative services. Reviewed option of keeping MD appt at 2 pm today, but patient is too weak and BP is too low to safely accomplish this.  Patient and family open to hospice but would like to go to ER today to see if fluids would help him feels better and to make sure they are doing everything they can to help him.  Daughter in law, [name] will call us with update or  if at any point patient changes his mind.  Palliative care will continue to follow patient until decision made to elect hospice services.  Notified hospital liaison karen to follow up with patient when he arrives in ER.  Daughter in [employer] adviser EMS for transport.  During physical exam, patient noted to have low blood pressure, dizziness with slight position changes.  Also had drop in O2 sats from 94 % on 2 LPM of O2 to 86 % on 2 LPM of O2 when changing positions to sitting.  Pt is requiring complete care from caregiver during the last few weeks.  Visit time: 11:30am-1:00 pm    Maricela Curet, RN

## 2018-01-15 NOTE — H&P (Signed)
[facility] NAME: Russell Howard    MR#:  [id number]  DATE OF BIRTH:  [birth year]  DATE OF ADMISSION:  01/15/2018  PRIMARY CARE PHYSICIAN: [name], MD   REQUESTING/REFERRING PHYSICIAN: Dr Larae Grooms  CHIEF COMPLAINT:   Chief Complaint  Patient presents with  . Failure To Thrive    HISTORY OF PRESENT ILLNESS:  Russell Howard  is a 83 y.o. male with a known history of orthostatic hypotension on numerous medications.  He has not been eating or drinking or taking his pills over the last couple days.  His palliative care nurse came out and his blood pressure was in the 50s.  They sent him into the emergency room for further evaluation.  In the ER he was found to have a urinary tract infection.  Patient's initial blood pressure was in the 50s but did come up to 100 with a fluid bolus.  Patient always complains of little shortness of breath and cough.  Has some nausea and some abdominal discomfort and some constipation.  No fever or chills.  Of note the patient does take midodrine, and hydrocortisone orally.  PAST MEDICAL HISTORY:   Past Medical History:  Diagnosis Date  . Acquired hypothyroidism 11/02/2014  . BPH (benign prostatic hyperplasia)   . Cardiac defibrillator in place 11/02/2014  . Cardiomyopathy ([place])   . CHF (congestive heart failure) ([place])   . Chronic diastolic heart failure ([place]) 11/02/2014  . COPD (chronic obstructive pulmonary disease) ([place])   . Gastro-esophageal reflux disease without esophagitis 11/02/2014  . H/O malignant neoplasm 11/09/2014  . Heart valve disease 11/02/2014  . History of atrial fibrillation 11/02/2014  . Lung cancer ([place])   . Neuropathy 11/02/2014  . Orthostasis 11/02/2014  . Pure hypercholesterolemia 11/02/2014  . Valvular heart disease     PAST SURGICAL HISTORY:   Past Surgical History:  Procedure Laterality Date  . APPENDECTOMY    . CARDIAC VALVE REPLACEMENT     with a  bovine valve  . DUAL ICD IMPLANT  2007/2009   implantable cardiac defibrillator  . PORT-A-CATH REMOVAL      SOCIAL HISTORY:   Social History   Tobacco Use  . Smoking status: Former Smoker    Types: Cigarettes    Last attempt to quit: 11/21/1981    Years since quitting: 36.1  . Smokeless tobacco: Never Used  Substance Use Topics  . Alcohol use: No    Alcohol/week: 0.0 standard drinks    FAMILY HISTORY:   Family History  Problem Relation Age of Onset  . Hypertension Other   . Heart disease Other   . CAD Father     DRUG ALLERGIES:   Allergies  Allergen Reactions  . Penicillin G Hives    Has patient had a PCN reaction causing immediate rash, facial/tongue/throat swelling, SOB or lightheadedness with hypotension: Yes Has patient had a PCN reaction causing severe rash involving mucus membranes or skin necrosis: No Has patient had a PCN reaction that required hospitalization No Has patient had a PCN reaction occurring within the last 10 years: No If all of the above answers are "NO", then may proceed with Cephalosporin use.  . Sulfa Antibiotics Rash    REVIEW OF SYSTEMS:  CONSTITUTIONAL: No fever, positive for hot and cold feeling.  Positive for fatigue.  EYES: Occasional hazy vision. EARS, NOSE, AND THROAT: No tinnitus or ear pain. No sore throat.  Positive for runny nose. RESPIRATORY: Some cough and shortness  of breath.  No wheezing or hemoptysis.  CARDIOVASCULAR: No chest pain, orthopnea, edema.  GASTROINTESTINAL: Positive for nausea, abdominal pain and constipation.  No blood in bowel movements GENITOURINARY: Does self-catheterization ENDOCRINE: No polyuria, nocturia,  HEMATOLOGY: No anemia, easy bruising or bleeding SKIN: No rash or lesion. MUSCULOSKELETAL: Some arthritis pain NEUROLOGIC: No tingling, numbness, weakness.  PSYCHIATRY: Some depression  MEDICATIONS AT HOME:   Prior to Admission medications   Medication Sig Start Date End Date Taking?  Authorizing Provider  acidophilus (RISAQUAD) CAPS capsule Take 1 capsule by mouth every evening.    Yes [provider]  apixaban (ELIQUIS) 5 MG TABS tablet Take 1 tablet (5 mg total) by mouth 2 (two) times daily. 09/02/17  Yes Gladstone Lighter, MD  Ascorbic Acid (VITAMIN C) 1000 MG tablet Take 500 mg by mouth 2 (two) times daily.    Yes [provider]  aspirin EC 81 MG tablet Take 81 mg by mouth daily.    Yes [provider]  Cholecalciferol 5000 units TABS Take 5,000 Units by mouth daily.   Yes [provider]  Coenzyme Q10 (CO Q-10) 120 MG CAPS Take 120 mg by mouth every evening.   Yes [provider]  digoxin (LANOXIN) 0.25 MG tablet Take 0.25 mg by mouth daily.    Yes [provider]  hydrocortisone (CORTEF) 10 MG tablet Take 10 mg by mouth 2 (two) times daily.    Yes [provider]  levothyroxine (SYNTHROID, LEVOTHROID) 100 MCG tablet Take 100 mcg by mouth daily before breakfast.   Yes [provider]  magnesium oxide (MAG-OX) 400 (241.3 Mg) MG tablet Take 400 mg by mouth every evening.   Yes [provider]  methenamine (HIPREX) 1 g tablet TAKE 1 TABLET BY MOUTH TWICE DAILY WITH MEALS 11/20/17  Yes McGowan, Larene Beach A, PA-C  midodrine (PROAMATINE) 10 MG tablet Take 1 tablet (10 mg total) by mouth 3 (three) times daily as needed. If systolic BP <277 09/26/21  Yes Fritzi Mandes, MD  Multiple Vitamin (MULTIVITAMIN WITH MINERALS) TABS tablet Take 1 tablet by mouth daily.   Yes [provider]  ondansetron (ZOFRAN) 4 MG tablet Take 4 mg by mouth every 8 (eight) hours as needed for nausea or vomiting.   Yes [provider]  pantoprazole (PROTONIX) 40 MG tablet Take 40 mg by mouth daily before breakfast.    Yes [provider]  saw palmetto 500 MG capsule Take 1,000 mg by mouth every evening.   Yes [provider]  simvastatin (ZOCOR) 20 MG tablet Take 20 mg by mouth every evening.     Yes [provider]  vitamin B-12 (CYANOCOBALAMIN) 500 MCG tablet Take 500 mcg by mouth daily.    Yes [provider]  vitamin E 400 UNIT capsule Take 400 Units by mouth every evening.    Yes [provider]  gabapentin (NEURONTIN) 600 MG tablet Take 600 mg by mouth 2 (two) times daily.    [provider]      VITAL SIGNS:  Blood pressure 112/85, pulse 97, temperature 97.9 F (36.6 C), temperature source Oral, resp. rate 19, height 5\' 10"  (1.778 m), weight 77.1 kg, SpO2 97 %.  PHYSICAL EXAMINATION:  GENERAL:  83 y.o.-year-old patient lying in the bed with no acute distress.  EYES: Pupils equal, round, reactive to light and accommodation. No scleral icterus. Extraocular muscles intact.  HEENT: Head atraumatic, normocephalic. Oropharynx and nasopharynx clear.  NECK:  Supple, no jugular venous distention. No  thyroid enlargement, no tenderness.  LUNGS: Normal breath sounds bilaterally, no wheezing, rales,rhonchi or crepitation. No use of accessory muscles of respiration.  CARDIOVASCULAR: S1, S2 normal. No murmurs, rubs, or gallops.  ABDOMEN: Soft, nontender, nondistended. Bowel sounds present. No organomegaly or mass.  EXTREMITIES: No pedal edema, cyanosis, or clubbing.  NEUROLOGIC: Cranial nerves II through XII are intact. Muscle strength 5/5 in all extremities. Sensation intact. Gait not checked.  PSYCHIATRIC: The patient is alert and oriented x 3.  SKIN: No rash, lesion, or ulcer.   LABORATORY PANEL:   CBC Recent Labs  Lab 01/15/18 1350  WBC 6.7  HGB 14.9  HCT 45.5  PLT 172   ------------------------------------------------------------------------------------------------------------------  Chemistries  Recent Labs  Lab 01/15/18 1350  NA 134*  K 4.3  CL 100  CO2 23  GLUCOSE 89  BUN 22  CREATININE 1.07  CALCIUM 9.0  AST 25  ALT 14  ALKPHOS 83  BILITOT 0.8    ------------------------------------------------------------------------------------------------------------------  Cardiac Enzymes Recent Labs  Lab 01/15/18 1350  TROPONINI <0.03   ------------------------------------------------------------------------------------------------------------------  RADIOLOGY:  Dg Chest Portable 1 View  Result Date: 01/15/2018 CLINICAL DATA:  Weakness EXAM: PORTABLE CHEST 1 VIEW COMPARISON:  10/17/2017, 08/31/2017, 11/17/2016 FINDINGS: Left-sided pacing device as before. Valve prosthesis. The left lung is clear. Diffuse pleural and parenchymal disease in the right thorax with volume loss, no change as compared with previous exams. IMPRESSION: No active disease. Similar appearance of right hemithorax volume loss and diffuse pleural and parenchymal disease. Electronically Signed   By: Donavan Foil M.D.   On: 01/15/2018 14:09    EKG:   Paced 98 bpm  IMPRESSION AND PLAN:   1.  Hypotension with history of orthostatic hypotension.  Also on Cortef so could be adrenal insufficiency.  We will give Solu-Medrol midodrine and IV fluids. 2.  Acute cystitis.  Could be a complicated infection because the patient does self catheterizations.  We will switch antibiotics over to Maxipime. 3.  Failure to thrive and not eating.  I advised the patient he must eat in order to survive. 4.  Atrial fibrillation history on Eliquis and digoxin 5.  History of chronic systolic congestive heart failure with EF of 20% 6.  COPD with history of lung cancer.  Chronic respiratory failure wearing 2 L of oxygen. 7.  Hypothyroidism unspecified on levothyroxine 8.  Neuropathy on gabapentin   All the records are reviewed and case discussed with ED provider. Management plans discussed with the patient, family and they are in agreement.  CODE STATUS: DNR  TOTAL TIME TAKING CARE OF THIS PATIENT: 50 minutes, including acp time.    Loletha Grayer M.D on 01/15/2018 at 7:13  PM  Between 7am to 6pm - Pager - [phone]  After 6pm call admission pager [phone]  [facility]  [phone]  CC: Primary care physician; [name], MD

## 2018-01-15 NOTE — ED Provider Notes (Signed)
[facility] Emergency Department Provider Note ____________________________________________   First MD Initiated Contact with Patient 01/15/18 1324     (approximate)  I have reviewed the triage vital signs and the nursing notes.   HISTORY  Chief Complaint Failure To Thrive    HPI Russell Howard is a 83 y.o. male with PMH as noted below who presents with weakness, generalized in nature, gradual onset over the last several weeks but worse in the last 2 days, and associated with malaise.  The patient denies any focal pain or other acute symptoms.  He feels dizzy when he sits or stands up, and a home nurse noted that his blood pressure dropped in the 50s when he tried to stand up.  Per the daughter-in-law the patient has had decreased p.o. intake.  She reports a gradual decline over the last 3 weeks, and an acute worsening in the last 2 days.  The patient has been admitted several times before for UTIs.  Past Medical History:  Diagnosis Date  . Acquired hypothyroidism 11/02/2014  . BPH (benign prostatic hyperplasia)   . Cardiac defibrillator in place 11/02/2014  . Cardiomyopathy ([place])   . CHF (congestive heart failure) ([place])   . Chronic diastolic heart failure ([place]) 11/02/2014  . COPD (chronic obstructive pulmonary disease) ([place])   . Gastro-esophageal reflux disease without esophagitis 11/02/2014  . H/O malignant neoplasm 11/09/2014  . Heart valve disease 11/02/2014  . History of atrial fibrillation 11/02/2014  . Lung cancer ([place])   . Neuropathy 11/02/2014  . Orthostasis 11/02/2014  . Pure hypercholesterolemia 11/02/2014  . Valvular heart disease     Patient Active Problem List   Diagnosis Date Noted  . Acute UTI 10/17/2017  . Altered mental status 08/31/2017  . Advance care planning   . Adjustment disorder with mixed anxiety and depressed mood   . Grief   . Acute respiratory failure ([place]) 06/21/2017  . UTI (urinary tract infection) 11/17/2016  . Empyema ([place])    . Weakness   . Community acquired pneumonia   . Pleural effusion   . Palliative care by specialist   . Acute on chronic respiratory failure with hypoxia ([place]) 08/19/2016  . Acute respiratory failure with hypoxia ([place]) 08/19/2016  . Septic shock ([place]) 03/02/2016  . Hydronephrosis, right 01/14/2016  . Constipation 01/13/2016  . Urinary retention 01/13/2016  . Lower abdominal pain 01/13/2016  . Bacteremia 01/13/2016  . Hypotension 01/13/2016  . Palliative care encounter   . Goals of care, counseling/discussion   . Cough   . Encounter for hospice care discussion   . Hypoxia 01/09/2016  . Primary cancer of bronchus of right lower lobe ([place]) 11/29/2015  . Sepsis ([place]) 06/14/2015  . Acute lower UTI 06/14/2015    Class: Acute  . H/O malignant neoplasm 11/09/2014  . Acquired hypothyroidism 11/02/2014  . Cardiomyopathy ([place]) 11/02/2014  . Acute on chronic diastolic CHF (congestive heart failure) ([place]) 11/02/2014  . Diabetes mellitus without complication ([place]) 20/25/4270  . Cardiac defibrillator in place 11/02/2014  . Gastro-esophageal reflux disease without esophagitis 11/02/2014  . History of atrial fibrillation 11/02/2014  . BP (high blood pressure) 11/02/2014  . Neuropathy 11/02/2014  . Orthostasis 11/02/2014  . Pure hypercholesterolemia 11/02/2014  . Heart valve disease 11/02/2014    Past Surgical History:  Procedure Laterality Date  . APPENDECTOMY    . CARDIAC VALVE REPLACEMENT     with a bovine valve  . DUAL ICD IMPLANT  2007/2009   implantable cardiac defibrillator  .  PORT-A-CATH REMOVAL      Prior to Admission medications   Medication Sig Start Date End Date Taking? Authorizing Provider  acidophilus (RISAQUAD) CAPS capsule Take 1 capsule by mouth every evening.    Yes [provider]  apixaban (ELIQUIS) 5 MG TABS tablet Take 1 tablet (5 mg total) by mouth 2 (two) times daily. 09/02/17  Yes Gladstone Lighter, MD  Ascorbic Acid (VITAMIN C) 1000 MG tablet  Take 500 mg by mouth 2 (two) times daily.    Yes [provider]  aspirin EC 81 MG tablet Take 81 mg by mouth daily.    Yes [provider]  Cholecalciferol 5000 units TABS Take 5,000 Units by mouth daily.   Yes [provider]  Coenzyme Q10 (CO Q-10) 120 MG CAPS Take 120 mg by mouth every evening.   Yes [provider]  digoxin (LANOXIN) 0.25 MG tablet Take 0.25 mg by mouth daily.    Yes [provider]  hydrocortisone (CORTEF) 10 MG tablet Take 10 mg by mouth 2 (two) times daily.    Yes [provider]  levothyroxine (SYNTHROID, LEVOTHROID) 100 MCG tablet Take 100 mcg by mouth daily before breakfast.   Yes [provider]  magnesium oxide (MAG-OX) 400 (241.3 Mg) MG tablet Take 400 mg by mouth every evening.   Yes [provider]  methenamine (HIPREX) 1 g tablet TAKE 1 TABLET BY MOUTH TWICE DAILY WITH MEALS 11/20/17  Yes McGowan, Larene Beach A, PA-C  midodrine (PROAMATINE) 10 MG tablet Take 1 tablet (10 mg total) by mouth 3 (three) times daily as needed. If systolic BP <371 6/96/78  Yes Fritzi Mandes, MD  Multiple Vitamin (MULTIVITAMIN WITH MINERALS) TABS tablet Take 1 tablet by mouth daily.   Yes [provider]  ondansetron (ZOFRAN) 4 MG tablet Take 4 mg by mouth every 8 (eight) hours as needed for nausea or vomiting.   Yes [provider]  pantoprazole (PROTONIX) 40 MG tablet Take 40 mg by mouth daily before breakfast.    Yes [provider]  saw palmetto 500 MG capsule Take 1,000 mg by mouth every evening.   Yes [provider]  simvastatin (ZOCOR) 20 MG tablet Take 20 mg by mouth every evening.    Yes [provider]  vitamin B-12 (CYANOCOBALAMIN) 500 MCG tablet Take 500 mcg by mouth daily.    Yes [provider]  vitamin E 400 UNIT capsule Take 400 Units by mouth every evening.    Yes [provider]  gabapentin (NEURONTIN) 600 MG tablet Take 600 mg by mouth 2 (two)  times daily.    [provider]    Allergies Penicillin g and Sulfa antibiotics  Family History  Problem Relation Age of Onset  . Hypertension Other   . Heart disease Other   . CAD Father     Social History Social History   Tobacco Use  . Smoking status: Former Smoker    Types: Cigarettes    Last attempt to quit: 11/21/1981    Years since quitting: 36.1  . Smokeless tobacco: Never Used  Substance Use Topics  . Alcohol use: No    Alcohol/week: 0.0 standard drinks  . Drug use: No    Review of Systems  Constitutional: No fever.  Positive for weakness. Eyes: No redness. ENT: No sore throat. Cardiovascular: Denies chest pain. Respiratory: Denies shortness of breath. Gastrointestinal: No vomiting or diarrhea.  Genitourinary: Negative for dysuria.  Musculoskeletal: Negative for back pain. Skin: Negative for  rash. Neurological: Negative for headache.   ____________________________________________   PHYSICAL EXAM:  VITAL SIGNS: ED Triage Vitals  Enc Vitals Group     BP 01/15/18 1325 116/79     Pulse Rate 01/15/18 1325 85     Resp 01/15/18 1325 (!) 24     Temp 01/15/18 1325 97.9 F (36.6 C)     Temp Source 01/15/18 1325 Oral     SpO2 01/15/18 1319 96 %     Weight 01/15/18 1326 170 lb (77.1 kg)     Height 01/15/18 1326 5\' 10"  (5.784 m)     Head Circumference --      Peak Flow --      Pain Score 01/15/18 1326 0     Pain Loc --      Pain Edu? --      Excl. in [place]? --     Constitutional: Alert and oriented.  Frail and weak appearing.  Eyes: Conjunctivae are normal.  Head: Atraumatic. Nose: No congestion/rhinnorhea. Mouth/Throat: Mucous membranes are dry.   Neck: Normal range of motion.  Cardiovascular: Borderline tachycardia, regular rhythm. Grossly normal heart sounds.  Good peripheral circulation. Respiratory: Normal respiratory effort.  No retractions.  Decreased breath sounds in the right lung. Gastrointestinal: Soft and nontender. No  distention.  Genitourinary: No flank tenderness. Musculoskeletal: No lower extremity edema.  Extremities warm and well perfused.  Neurologic:  Normal speech and language. No gross focal neurologic deficits are appreciated.  Skin:  Skin is warm and dry.  Pale appearing.  No rash noted. Psychiatric: Mood and affect are normal. Speech and behavior are normal.  ____________________________________________   LABS (all labs ordered are listed, but only abnormal results are displayed)  Labs Reviewed  CBC WITH DIFFERENTIAL/PLATELET - Abnormal; Notable for the following components:      Result Value   Monocytes Absolute 1.1 (*)    All other components within normal limits  COMPREHENSIVE METABOLIC PANEL - Abnormal; Notable for the following components:   Sodium 134 (*)    All other components within normal limits  URINALYSIS, COMPLETE (UACMP) WITH MICROSCOPIC - Abnormal; Notable for the following components:   Color, Urine YELLOW (*)    APPearance HAZY (*)    Hgb urine dipstick SMALL (*)    Ketones, ur 20 (*)    Leukocytes, UA MODERATE (*)    WBC, UA >50 (*)    All other components within normal limits  TROPONIN I  LACTIC ACID, PLASMA  LACTIC ACID, PLASMA  INFLUENZA PANEL BY PCR (TYPE A & B)   ____________________________________________  EKG  ED ECG REPORT I, Arta Silence, the attending physician, personally viewed and interpreted this ECG.  Date: 01/15/2018 EKG Time: 1317 Rate: 98 Rhythm: AV dual paced rhythm Intervals: n/a ST/T Wave abnormalities: n/a Narrative Interpretation: no evidence of acute ischemia  ____________________________________________  RADIOLOGY  CXR: Opacity right hemithorax, unchanged from prior.  No acute findings. ____________________________________________   PROCEDURES  Procedure(s) performed: No  Procedures  Critical Care performed: No ____________________________________________   INITIAL IMPRESSION / ASSESSMENT AND PLAN / ED  COURSE  Pertinent labs & imaging results that were available during my care of the patient were reviewed by me and considered in my medical decision making (see chart for details).  83 year old male with PMH as noted above presents with generalized weakness and malaise over the last several days, with orthostatic hypotension noted today.  On exam, the patient is extremely weak and frail appearing although alert and oriented.  The remainder of  the exam is as described above.  I reviewed the past medical records in Epic; the patient was most recently admitted in September with UTI.  Differential includes dehydration, infection/sepsis, or less likely CHF exacerbation or other cardiac etiology.  The patient appears volume depleted which would go along with his vital signs and orthostatics.  I had an extensive discussion with the daughter-in-law who is the patient's main caregiver.  She is concerned that he has had a gradual decline over several weeks, with acute symptoms today although she also feels comfortable taking him home if he is worked up and stabilized.  We will obtain lab work-up including sepsis labs, give fluids, and reassess.  ----------------------------------------- 5:05 PM on 01/15/2018 -----------------------------------------  Work-up resulted so far shows no significant findings.  I am signing the patient out to the oncoming physician Dr. Clearnce Hasten.  If the patient has a UTI or remains orthostatic after fluids, I think it would be best to admit him.  If his work-up is negative and he has no orthostatic symptoms and appears more comfortable, discharge would be appropriate.  ____________________________________________   FINAL CLINICAL IMPRESSION(S) / ED DIAGNOSES  Final diagnoses:  Weakness  Orthostatic hypotension      NEW MEDICATIONS STARTED DURING THIS VISIT:  New Prescriptions   No medications on file     Note:  This document was prepared using Dragon voice  recognition software and may include unintentional dictation errors.    Arta Silence, MD 01/15/18 [phone]

## 2018-01-16 LAB — DIGOXIN LEVEL: Digoxin Level: 0.3 ng/mL — ABNORMAL LOW (ref 0.8–2.0)

## 2018-01-16 LAB — BASIC METABOLIC PANEL
Anion gap: 9 (ref 5–15)
BUN: 21 mg/dL (ref 8–23)
CO2: 25 mmol/L (ref 22–32)
Calcium: 8.8 mg/dL — ABNORMAL LOW (ref 8.9–10.3)
Chloride: 100 mmol/L (ref 98–111)
Creatinine, Ser: 1.05 mg/dL (ref 0.61–1.24)
GFR calc Af Amer: >60 mL/min (ref >60)
GFR calc non Af Amer: >60 mL/min (ref >60)
GLUCOSE: 135 mg/dL — AB (ref 70–99)
Potassium: 5.2 mmol/L — ABNORMAL HIGH (ref 3.5–5.1)
Sodium: 134 mmol/L — ABNORMAL LOW (ref 135–145)

## 2018-01-16 LAB — CBC
HCT: 44.1 % (ref 39.0–52.0)
HEMOGLOBIN: 14 g/dL (ref 13.0–17.0)
MCH: 28.7 pg (ref 26.0–34.0)
MCHC: 31.7 g/dL (ref 30.0–36.0)
MCV: 90.4 fL (ref 80.0–100.0)
Platelets: 167 10*3/uL (ref 150–400)
RBC: 4.88 MIL/uL (ref 4.22–5.81)
RDW: 14.7 % (ref 11.5–15.5)
WBC: 7.9 10*3/uL (ref 4.0–10.5)
nRBC: 0 % (ref 0.0–0.2)

## 2018-01-16 NOTE — Progress Notes (Signed)
[facility] [place] NAME: Russell Howard    MR#:  [id number]  DATE OF BIRTH:  [birth year]  SUBJECTIVE:  CHIEF COMPLAINT:   Chief Complaint  Patient presents with  . Failure To Thrive   Generalized weakness. REVIEW OF SYSTEMS:  Review of Systems  Constitutional: Positive for malaise/fatigue. Negative for chills and fever.  HENT: Negative for sore throat.   Eyes: Negative for blurred vision and double vision.  Respiratory: Negative for cough, hemoptysis, shortness of breath, wheezing and stridor.   Cardiovascular: Negative for chest pain, palpitations, orthopnea and leg swelling.  Gastrointestinal: Negative for abdominal pain, blood in stool, diarrhea, melena, nausea and vomiting.  Genitourinary: Negative for dysuria, flank pain and hematuria.  Musculoskeletal: Negative for back pain and joint pain.  Skin: Negative for rash.  Neurological: Negative for dizziness, sensory change, focal weakness, seizures, loss of consciousness, weakness and headaches.  Endo/Heme/Allergies: Negative for polydipsia.  Psychiatric/Behavioral: Negative for depression. The patient is not nervous/anxious.     DRUG ALLERGIES:   Allergies  Allergen Reactions  . Penicillin G Hives    Has patient had a PCN reaction causing immediate rash, facial/tongue/throat swelling, SOB or lightheadedness with hypotension: Yes Has patient had a PCN reaction causing severe rash involving mucus membranes or skin necrosis: No Has patient had a PCN reaction that required hospitalization No Has patient had a PCN reaction occurring within the last 10 years: No If all of the above answers are "NO", then may proceed with Cephalosporin use.  . Sulfa Antibiotics Rash   VITALS:  Blood pressure 100/77, pulse (!) 106, temperature (!) 97.5 F (36.4 C), temperature source Oral, resp. rate 18, height 5\' 10"  (1.778 m), weight 73.5 kg, SpO2 95 %. PHYSICAL EXAMINATION:  Physical  Exam Constitutional:      General: He is not in acute distress. HENT:     Head: Normocephalic.     Mouth/Throat:     Mouth: Mucous membranes are moist.  Eyes:     General: No scleral icterus.    Conjunctiva/sclera: Conjunctivae normal.     Pupils: Pupils are equal, round, and reactive to light.  Neck:     Musculoskeletal: Normal range of motion and neck supple.     Vascular: No JVD.     Trachea: No tracheal deviation.  Cardiovascular:     Rate and Rhythm: Normal rate and regular rhythm.     Heart sounds: Normal heart sounds. No murmur. No gallop.   Pulmonary:     Effort: Pulmonary effort is normal. No respiratory distress.     Breath sounds: Normal breath sounds. No wheezing or rales.  Abdominal:     General: Bowel sounds are normal. There is no distension.     Palpations: Abdomen is soft.     Tenderness: There is no abdominal tenderness. There is no rebound.  Musculoskeletal: Normal range of motion.        General: No tenderness.     Right lower leg: No edema.     Left lower leg: No edema.  Skin:    Findings: No erythema or rash.  Neurological:     Mental Status: He is alert and oriented to person, place, and time.     Cranial Nerves: No cranial nerve deficit.  Psychiatric:        Mood and Affect: Mood normal.    LABORATORY PANEL:  Male CBC Recent Labs  Lab 01/16/18 0425  WBC 7.9  HGB 14.0  HCT  44.1  PLT 167   ------------------------------------------------------------------------------------------------------------------ Chemistries  Recent Labs  Lab 01/15/18 1350 01/16/18 0425  NA 134* 134*  K 4.3 5.2*  CL 100 100  CO2 23 25  GLUCOSE 89 135*  BUN 22 21  CREATININE 1.07 1.05  CALCIUM 9.0 8.8*  AST 25  --   ALT 14  --   ALKPHOS 83  --   BILITOT 0.8  --    RADIOLOGY:  No results found. ASSESSMENT AND PLAN:   1.  Hypotension with history of orthostatic hypotension.  Also on Cortef so could be adrenal insufficiency.   Continue Solu-Medrol,  midodrine and IV fluids.  2.  Acute cystitis.  Could be a complicated infection because the patient does self catheterizations. Continue Maxipime.  Follow-up cultures.  3.  Failure to thrive and not eating.  Encourage oral intake.    4.  Atrial fibrillation history on Eliquis and digoxin  5.  History of chronic systolic congestive heart failure with EF of 20%  6.  COPD with history of lung cancer.  Chronic respiratory failure wearing 2 L of oxygen.  7.  Hypothyroidism unspecified on levothyroxine 8.  Neuropathy on gabapentin 9. BPH. self catheterization.  Hyperkalemia.  Continue IV fluid support and follow-up BMP.  All the records are reviewed and case discussed with Care Management/Social Worker. Management plans discussed with the patient, family and they are in agreement.  CODE STATUS: DNR  TOTAL TIME TAKING CARE OF THIS PATIENT: 32 minutes.   More than 50% of the time was spent in counseling/coordination of care: YES  POSSIBLE D/C IN 2 DAYS, DEPENDING ON CLINICAL CONDITION.   Demetrios Loll M.D on 01/16/2018 at 2:15 PM  Between 7am to 6pm - Pager - [phone]  After 6pm go to [email or web address] - [facility]

## 2018-01-16 NOTE — Evaluation (Signed)
Physical Therapy Evaluation Patient Details Name: Russell Howard MRN: [id number] DOB: 1934-05-04 Today's Date: 01/16/2018   History of Present Illness  Patient is an 83 year old male admitted for dehydration, hypotension, weakness and UTI.  PMH includes colon CA with subtotal colectomy, valvular heart disease, lunc CA, atrial fibrillation, COPD, CHF, defibrillator and cardiomyopathy.  Clinical Impression  Pt is an 83 year old male who lives with his son and daughter-in-law.  He is a limited household ambulator with assistance for transfers to [facility] at baseline.  Pt is a poor historian, stating that he "can't remember" if he has walked with RW in the past two weeks when asked.  Pt appearing very lethargic and discouraged and lying flat in bed on his L side upon PT arrival.  PT noted darker red markings on pt's medial knees as a result of pressure.  PT educated pt on prevention of pressure ulcers and assisted in positioning to which pt did not appear interested.  Pt attempted to sit up at EOB but was only able to achieve a max of 20 sec before quickly returning to sidelying position.  Pt required min A to sit up but will require +2 assist to transfer due to low tolerance to activity.  Due to pt not functioning at baseline, pt will benefit from continued participation with skilled PT with focus on strength, tolerance to activity and safe functional mobility.  He is appropriate for SNF placement following discharge due to need for caregiver support at this time.    Follow Up Recommendations SNF    Equipment Recommendations  None recommended by PT    Recommendations for Other Services       Precautions / Restrictions Precautions Precautions: Fall Restrictions Weight Bearing Restrictions: No      Mobility  Bed Mobility Overal bed mobility: Needs Assistance Bed Mobility: Supine to Sit     Supine to sit: Min assist     General bed mobility comments: Hand held assist to complete transfer.  Pt  very kyphotic when sitting with elbows resting on knees.  Sat for approximately 20 seconds and slowly lowered himself back into bed repeating "oh my" and reporting weakness.  Transfers                    Ambulation/Gait                Stairs            Wheelchair Mobility    Modified Rankin (Stroke Patients Only)       Balance Overall balance assessment: Needs assistance Sitting-balance support: Feet supported;Bilateral upper extremity supported Sitting balance-Leahy Scale: Poor Sitting balance - Comments: Very low tolerance to sitting upright.                                     Pertinent Vitals/Pain Pain Assessment: No/denies pain(States that he has no pain but that his weakness feels "almost like pain".)    Home Living Family/patient expects to be discharged to:: Private residence Living Arrangements: Children(Son and daughter in law.) Available Help at Discharge: Family;Available PRN/intermittently Type of Home: House Home Access: Ramped entrance     Home Layout: One level Home Equipment: Walker - 2 wheels;Wheelchair - manual      Prior Function Level of Independence: Needs assistance   Gait / Transfers Assistance Needed: Son and daughter in law assist with transfers.  Pt is  primarily in bed during the day per pt report.  He thinks he has walked with a RW short household distances in the past two weeks but also stated that he "can''t remember" if it has been that recent.           Hand Dominance        Extremity/Trunk Assessment   Upper Extremity Assessment Upper Extremity Assessment: Generalized weakness    Lower Extremity Assessment Lower Extremity Assessment: Generalized weakness    Cervical / Trunk Assessment Cervical / Trunk Assessment: Kyphotic  Communication   Communication: No difficulties  Cognition Arousal/Alertness: Lethargic Behavior During Therapy: WFL for tasks  assessed/performed(Despondent) Overall Cognitive Status: Within Functional Limits for tasks assessed                                 General Comments: Patient very low energy and making statements such as "I just don't know if the strength will ever return".       General Comments      Exercises     Assessment/Plan    PT Assessment Patient needs continued PT services  PT Problem List Decreased mobility;Decreased strength;Decreased activity tolerance;Decreased balance       PT Treatment Interventions DME instruction;Functional mobility training;Balance training;Gait training;Therapeutic activities;Stair training;Therapeutic exercise;Neuromuscular re-education;Patient/family education    PT Goals (Current goals can be found in the Care Plan section)  Acute Rehab PT Goals Patient Stated Goal: To go home PT Goal Formulation: With patient Time For Goal Achievement: 01/30/18 Potential to Achieve Goals: Fair    Frequency Min 2X/week   Barriers to discharge        Co-evaluation               AM-PAC PT "6 Clicks" Mobility  Outcome Measure Help needed turning from your back to your side while in a flat bed without using bedrails?: A Little Help needed moving from lying on your back to sitting on the side of a flat bed without using bedrails?: A Little Help needed moving to and from a bed to a chair (including a wheelchair)?: A Lot Help needed standing up from a chair using your arms (e.g., wheelchair or bedside chair)?: A Lot Help needed to walk in hospital room?: A Lot Help needed climbing 3-5 steps with a railing? : Total 6 Click Score: 13    End of Session   Activity Tolerance: Patient limited by fatigue Patient left: in bed;with call bell/phone within reach;with bed alarm set Nurse Communication: Mobility status PT Visit Diagnosis: Muscle weakness (generalized) (M62.81);Adult, failure to thrive (R62.7)    Time: 7062-3762 PT Time Calculation (min)  (ACUTE ONLY): 17 min   Charges:   PT Evaluation $PT Eval Low Complexity: 1 Low         Roxanne Gates, PT, DPT   Roxanne Gates 01/16/2018, 9:37 AM

## 2018-01-17 LAB — BASIC METABOLIC PANEL
Anion gap: 8 (ref 5–15)
BUN: 24 mg/dL — ABNORMAL HIGH (ref 8–23)
CO2: 24 mmol/L (ref 22–32)
Calcium: 8.9 mg/dL (ref 8.9–10.3)
Chloride: 103 mmol/L (ref 98–111)
Creatinine, Ser: 0.9 mg/dL (ref 0.61–1.24)
GFR calc Af Amer: >60 mL/min (ref >60)
GFR calc non Af Amer: >60 mL/min (ref >60)
Glucose, Bld: 140 mg/dL — ABNORMAL HIGH (ref 70–99)
Potassium: 4.5 mmol/L (ref 3.5–5.1)
Sodium: 135 mmol/L (ref 135–145)

## 2018-01-17 NOTE — Progress Notes (Signed)
LCSW met with patient and he stated he did not want go to SNF in the same breathe patient stated he will do whatever his  Daughter in law wants. LCSW provided [email or web address] list in patient records and called patients daughter.   Patients family want patient to DC with home health [facility]) LCSW will text Care manager to advise them of this preference  Azzie Thiem [place] LCSW [phone]

## 2018-01-17 NOTE — Progress Notes (Signed)
[facility] [place] [facility] [place] NAME: Russell Howard    MR#:  [id number]  DATE OF BIRTH:  Nov 28, 1934  SUBJECTIVE:  CHIEF COMPLAINT:   Chief Complaint  Patient presents with  . Failure To Thrive   Generalized weakness. REVIEW OF SYSTEMS:  Review of Systems  Constitutional: Positive for malaise/fatigue. Negative for chills and fever.  HENT: Negative for sore throat.   Eyes: Negative for blurred vision and double vision.  Respiratory: Negative for cough, hemoptysis, shortness of breath, wheezing and stridor.   Cardiovascular: Negative for chest pain, palpitations, orthopnea and leg swelling.  Gastrointestinal: Negative for abdominal pain, blood in stool, diarrhea, melena, nausea and vomiting.  Genitourinary: Negative for dysuria, flank pain and hematuria.  Musculoskeletal: Negative for back pain and joint pain.  Skin: Negative for rash.  Neurological: Negative for dizziness, sensory change, focal weakness, seizures, loss of consciousness, weakness and headaches.  Endo/Heme/Allergies: Negative for polydipsia.  Psychiatric/Behavioral: Negative for depression. The patient is not nervous/anxious.     DRUG ALLERGIES:   Allergies  Allergen Reactions  . Penicillin G Hives    Has patient had a PCN reaction causing immediate rash, facial/tongue/throat swelling, SOB or lightheadedness with hypotension: Yes Has patient had a PCN reaction causing severe rash involving mucus membranes or skin necrosis: No Has patient had a PCN reaction that required hospitalization No Has patient had a PCN reaction occurring within the last 10 years: No If all of the above answers are "NO", then may proceed with Cephalosporin use.  . Sulfa Antibiotics Rash   VITALS:  Blood pressure 122/81, pulse 76, temperature (!) 97.5 F (36.4 C), temperature source Oral, resp. rate 17, height 5\' 10"  (1.778 m), weight 73.5 kg, SpO2 98 %. PHYSICAL EXAMINATION:  Physical  Exam Constitutional:      General: He is not in acute distress. HENT:     Head: Normocephalic.     Mouth/Throat:     Mouth: Mucous membranes are moist.  Eyes:     General: No scleral icterus.    Conjunctiva/sclera: Conjunctivae normal.     Pupils: Pupils are equal, round, and reactive to light.  Neck:     Musculoskeletal: Normal range of motion and neck supple.     Vascular: No JVD.     Trachea: No tracheal deviation.  Cardiovascular:     Rate and Rhythm: Normal rate and regular rhythm.     Heart sounds: Normal heart sounds. No murmur. No gallop.   Pulmonary:     Effort: Pulmonary effort is normal. No respiratory distress.     Breath sounds: Normal breath sounds. No wheezing or rales.  Abdominal:     General: Bowel sounds are normal. There is no distension.     Palpations: Abdomen is soft.     Tenderness: There is no abdominal tenderness. There is no rebound.  Musculoskeletal: Normal range of motion.        General: No tenderness.     Right lower leg: No edema.     Left lower leg: No edema.  Skin:    Findings: No erythema or rash.  Neurological:     Mental Status: He is alert and oriented to person, place, and time.     Cranial Nerves: No cranial nerve deficit.  Psychiatric:        Mood and Affect: Mood normal.    LABORATORY PANEL:  Male CBC Recent Labs  Lab 01/16/18 0425  WBC 7.9  HGB 14.0  HCT 44.1  PLT 167   ------------------------------------------------------------------------------------------------------------------ Chemistries  Recent Labs  Lab 01/15/18 1350  01/17/18 0837  NA 134*   < > 135  K 4.3   < > 4.5  CL 100   < > 103  CO2 23   < > 24  GLUCOSE 89   < > 140*  BUN 22   < > 24*  CREATININE 1.07   < > 0.90  CALCIUM 9.0   < > 8.9  AST 25  --   --   ALT 14  --   --   ALKPHOS 83  --   --   BILITOT 0.8  --   --    < > = values in this interval not displayed.   RADIOLOGY:  No results found. ASSESSMENT AND PLAN:   1.  Hypotension with  history of orthostatic hypotension.  Also on Cortef so could be adrenal insufficiency.   Continue Solu-Medrol, midodrine and IV fluids.  2.  Acute cystitis.  Could be a complicated infection because the patient does self catheterizations.  Continue Maxipime.   Urine culture shows: 20,000 COLONIES/mL STAPHYLOCOCCUS AUREUS  CULTURE REINCUBATED FOR BETTER GROWTH   3.  Failure to thrive and not eating.  Encourage oral intake.    4.  Atrial fibrillation history on Eliquis and digoxin  5.  History of chronic systolic congestive heart failure with EF of 20%  6.  COPD with history of lung cancer.  Chronic respiratory failure wearing 2 L of oxygen.  7.  Hypothyroidism unspecified on levothyroxine 8.  Neuropathy on gabapentin 9. BPH. self catheterization.  Hyperkalemia.  Improved with IV fluid support. Dehydration.  Continue gentle IV fluid.  All the records are reviewed and case discussed with Care Management/Social Worker. Management plans discussed with the patient, family and they are in agreement.  CODE STATUS: DNR  TOTAL TIME TAKING CARE OF THIS PATIENT: 26 minutes.   More than 50% of the time was spent in counseling/coordination of care: YES  POSSIBLE D/C IN 2 DAYS, DEPENDING ON CLINICAL CONDITION.   Demetrios Loll M.D on 01/17/2018 at 3:28 PM  Between 7am to 6pm - Pager - [phone]  After 6pm go to [email or web address] - [employer] [facility]

## 2018-01-18 LAB — BASIC METABOLIC PANEL
Anion gap: 4 — ABNORMAL LOW (ref 5–15)
BUN: 24 mg/dL — AB (ref 8–23)
CALCIUM: 8.4 mg/dL — AB (ref 8.9–10.3)
CO2: 26 mmol/L (ref 22–32)
CREATININE: 0.91 mg/dL (ref 0.61–1.24)
Chloride: 106 mmol/L (ref 98–111)
GFR calc Af Amer: >60 mL/min (ref >60)
GFR calc non Af Amer: >60 mL/min (ref >60)
Glucose, Bld: 155 mg/dL — ABNORMAL HIGH (ref 70–99)
Potassium: 4 mmol/L (ref 3.5–5.1)
Sodium: 136 mmol/L (ref 135–145)

## 2018-01-18 MED ORDER — CEPHALEXIN 500 MG PO CAPS: 500.0000 mg | ORAL_CAPSULE | Freq: Three times a day (TID) | ORAL | Status: DC

## 2018-01-18 MED ORDER — CEPHALEXIN 500 MG PO CAPS: 500.0000 mg | ORAL_CAPSULE | Freq: Three times a day (TID) | ORAL | 0 refills | Status: AC

## 2018-01-18 NOTE — Care Management Important Message (Signed)
Important Message  Patient Details  Name: Russell Howard MRN: [id number] Date of Birth: 1934-12-27   Medicare Important Message Given:  Yes    [name] 01/18/2018, 10:43 AM

## 2018-01-18 NOTE — Discharge Summary (Signed)
[facility] NAME: Russell Howard    MR#:  [id number]  DATE OF BIRTH:  81-Oct-1936  DATE OF ADMISSION:  81/13/2019   ADMITTING PHYSICIAN: Loletha Grayer, MD  DATE OF DISCHARGE: 81/16/2019 12:09 PM  PRIMARY CARE PHYSICIAN: [name], MD   ADMISSION DIAGNOSIS:  Orthostatic hypotension [I95.1] Weakness [R53.1] Urinary tract infection without hematuria, site unspecified [N39.0] DISCHARGE DIAGNOSIS:  Active Problems:   Hypotension  SECONDARY DIAGNOSIS:   Past Medical History:  Diagnosis Date  . Acquired hypothyroidism 11/02/2014  . BPH (benign prostatic hyperplasia)   . Cardiac defibrillator in place 11/02/2014  . Cardiomyopathy ([place])   . CHF (congestive heart failure) ([place])   . Chronic diastolic heart failure ([place]) 11/02/2014  . COPD (chronic obstructive pulmonary disease) ([place])   . Gastro-esophageal reflux disease without esophagitis 11/02/2014  . H/O malignant neoplasm 11/09/2014  . Heart valve disease 11/02/2014  . History of atrial fibrillation 11/02/2014  . Lung cancer ([place])   . Neuropathy 11/02/2014  . Orthostasis 11/02/2014  . Pure hypercholesterolemia 11/02/2014  . Valvular heart disease    HOSPITAL COURSE:  1. Hypotension with history of orthostatic hypotension. Also on Cortef so could be adrenal insufficiency.  He is treated with Solu-Medrol and IVF, jchange to home steroid, continue  Midodrine. Improved.  2. Acute cystitis. Could be a complicated infection because the patient does self catheterizations.  He was treated with Maxipime.   Urine culture shows: 20,000 COLONIES/mL STAPHYLOCOCCUS AUREUS  CULTURE REINCUBATED FOR BETTER GROWTH. Change to po keflex for 3 days.  3. Failure to thrive and not eating.  Encourage oral intake and he has better oral intake.  4. Atrial fibrillation history on Eliquis and digoxin  5. History of chronic systolic congestive heart failure with EF of 20%  6.  COPD with history of lung cancer.Chronic respiratory failure wearing 2 L of oxygen.  7. Hypothyroidism unspecified on levothyroxine 8. Neuropathy on gabapentin 9. BPH. self catheterization.  Hyperkalemia.  Improved with IV fluid support. Dehydration. Given gentle IV fluid.  DISCHARGE CONDITIONS:  Stable ,discharge to home with HHPT and palliative care. CONSULTS OBTAINED:   DRUG ALLERGIES:   Allergies  Allergen Reactions  . Penicillin G Hives    Has patient had a PCN reaction causing immediate rash, facial/tongue/throat swelling, SOB or lightheadedness with hypotension: Yes Has patient had a PCN reaction causing severe rash involving mucus membranes or skin necrosis: No Has patient had a PCN reaction that required hospitalization No Has patient had a PCN reaction occurring within the last 10 years: No If all of the above answers are "NO", then may proceed with Cephalosporin use.  . Sulfa Antibiotics Rash   DISCHARGE MEDICATIONS:   Allergies as of 01/18/2018      Reactions   Penicillin G Hives   Has patient had a PCN reaction causing immediate rash, facial/tongue/throat swelling, SOB or lightheadedness with hypotension: Yes Has patient had a PCN reaction causing severe rash involving mucus membranes or skin necrosis: No Has patient had a PCN reaction that required hospitalization No Has patient had a PCN reaction occurring within the last 10 years: No If all of the above answers are "NO", then may proceed with Cephalosporin use.   Sulfa Antibiotics Rash      Medication List    TAKE these medications   acidophilus Caps capsule Take 1 capsule by mouth every evening.   apixaban 5 MG Tabs tablet Commonly known as:  ELIQUIS Take 1  tablet (5 mg total) by mouth 2 (two) times daily.   aspirin EC 81 MG tablet Take 81 mg by mouth daily.   cephALEXin 500 MG capsule Commonly known as:  KEFLEX Take 1 capsule (500 mg total) by mouth every 8 (eight) hours for 3 days.     Cholecalciferol 125 MCG (5000 UT) Tabs Take 5,000 Units by mouth daily.   Co Q-10 120 MG Caps Take 120 mg by mouth every evening.   digoxin 0.25 MG tablet Commonly known as:  LANOXIN Take 0.25 mg by mouth daily.   gabapentin 600 MG tablet Commonly known as:  NEURONTIN Take 600 mg by mouth 2 (two) times daily.   hydrocortisone 10 MG tablet Commonly known as:  CORTEF Take 10 mg by mouth 2 (two) times daily.   levothyroxine 100 MCG tablet Commonly known as:  SYNTHROID, LEVOTHROID Take 100 mcg by mouth daily before breakfast.   magnesium oxide 400 (241.3 Mg) MG tablet Commonly known as:  MAG-OX Take 400 mg by mouth every evening.   methenamine 1 g tablet Commonly known as:  HIPREX TAKE 1 TABLET BY MOUTH TWICE DAILY WITH MEALS   midodrine 10 MG tablet Commonly known as:  PROAMATINE Take 1 tablet (10 mg total) by mouth 3 (three) times daily as needed. If systolic BP <269   multivitamin with minerals Tabs tablet Take 1 tablet by mouth daily.   ondansetron 4 MG tablet Commonly known as:  ZOFRAN Take 4 mg by mouth every 8 (eight) hours as needed for nausea or vomiting.   pantoprazole 40 MG tablet Commonly known as:  PROTONIX Take 40 mg by mouth daily before breakfast.   saw palmetto 500 MG capsule Take 1,000 mg by mouth every evening.   simvastatin 20 MG tablet Commonly known as:  ZOCOR Take 20 mg by mouth every evening.   vitamin B-12 500 MCG tablet Commonly known as:  CYANOCOBALAMIN Take 500 mcg by mouth daily.   vitamin C 1000 MG tablet Take 500 mg by mouth 2 (two) times daily.   vitamin E 400 UNIT capsule Take 400 Units by mouth every evening.        DISCHARGE INSTRUCTIONS:  See AVS.  If you experience worsening of your admission symptoms, develop shortness of breath, life threatening emergency, suicidal or homicidal thoughts you must seek medical attention immediately by calling 911 or calling your MD immediately  if symptoms less severe.  You Must  read complete instructions/literature along with all the possible adverse reactions/side effects for all the Medicines you take and that have been prescribed to you. Take any new Medicines after you have completely understood and accpet all the possible adverse reactions/side effects.   Please note  You were cared for by a hospitalist during your hospital stay. If you have any questions about your discharge medications or the care you received while you were in the hospital after you are discharged, you can call the unit and asked to speak with the hospitalist on call if the hospitalist that took care of you is not available. Once you are discharged, your primary care physician will handle any further medical issues. Please note that NO REFILLS for any discharge medications will be authorized once you are discharged, as it is imperative that you return to your primary care physician (or establish a relationship with a primary care physician if you do not have one) for your aftercare needs so that they can reassess your need for medications and monitor your lab values.  On the day of Discharge:  VITAL SIGNS:  Blood pressure (!) 142/77, pulse 75, temperature 98.6 F (37 C), resp. rate 18, height 5\' 10"  (1.778 m), weight 73.5 kg, SpO2 94 %. PHYSICAL EXAMINATION:  GENERAL:  83 y.o.-year-old patient lying in the bed with no acute distress.  EYES: Pupils equal, round, reactive to light and accommodation. No scleral icterus. Extraocular muscles intact.  HEENT: Head atraumatic, normocephalic. Oropharynx and nasopharynx clear.  NECK:  Supple, no jugular venous distention. No thyroid enlargement, no tenderness.  LUNGS: Normal breath sounds bilaterally, no wheezing, rales,rhonchi or crepitation. No use of accessory muscles of respiration.  CARDIOVASCULAR: S1, S2 normal. No murmurs, rubs, or gallops.  ABDOMEN: Soft, non-tender, non-distended. Bowel sounds present. No organomegaly or mass.  EXTREMITIES: No  pedal edema, cyanosis, or clubbing.  NEUROLOGIC: Cranial nerves II through XII are intact. Muscle strength 3/5 in all extremities. Sensation intact. Gait not checked.  PSYCHIATRIC: The patient is alert and oriented x 3.  SKIN: No obvious rash, lesion, or ulcer.  DATA REVIEW:   CBC Recent Labs  Lab 01/16/18 0425  WBC 7.9  HGB 14.0  HCT 44.1  PLT 167    Chemistries  Recent Labs  Lab 01/15/18 1350  01/18/18 0433  NA 134*   < > 136  K 4.3   < > 4.0  CL 100   < > 106  CO2 23   < > 26  GLUCOSE 89   < > 155*  BUN 22   < > 24*  CREATININE 1.07   < > 0.91  CALCIUM 9.0   < > 8.4*  AST 25  --   --   ALT 14  --   --   ALKPHOS 83  --   --   BILITOT 0.8  --   --    < > = values in this interval not displayed.     Microbiology Results  Results for orders placed or performed during the hospital encounter of 01/15/18  Urine Culture     Status: Abnormal (Preliminary result)   Collection Time: 01/15/18  2:37 PM  Result Value Ref Range Status   Specimen Description   Final    URINE, RANDOM Performed at [facility], [address] [place], [place] [address]    Special Requests   Final    NONE Performed at [facility], [address] [place], [place] [address]    Culture (A)  Final    20,000 COLONIES/mL STAPHYLOCOCCUS AUREUS SUSCEPTIBILITIES TO FOLLOW Performed at [facility], [place] [address] [place], [place] [address]    Report Status PENDING  Incomplete    RADIOLOGY:  No results found.   Management plans discussed with the patient, family and they are in agreement.  CODE STATUS: DNR   TOTAL TIME TAKING CARE OF THIS PATIENT: 32  minutes.    Demetrios Loll M.D on 01/18/2018 at 12:35 PM  Between 7am to 6pm - Pager - [phone]  After 6pm go to [email or web address] - Proofreader  [facility]  Office  [phone]  CC: Primary care physician; [name], MD   Note: This dictation was prepared with  Dragon dictation along with smaller phrase technology. Any transcriptional errors that result from this process are unintentional.

## 2018-01-18 NOTE — Discharge Instructions (Signed)
HHPT °[place]. °

## 2018-01-18 NOTE — Progress Notes (Signed)
Please note, [patient is currently followed by outpatient PALLIATIVE at home. [place] made aware. Flo Shanks RN, BSN, [facility] [place], hospital liaison [phone]

## 2018-01-18 NOTE — Care Management Note (Signed)
Case Management Note  Patient Details  Name: Derric Dealmeida MRN: [id number] Date of Birth: 08/11/1934  Subjective/Objective:      Admitted to [facility] with the diagnosis of hypotension. Lives with son and daughter-in-law. Sees Dr. Doy Hutching as primary care physician. Chronic oxygen 2-3 liters per nasal cannula.               Action/Plan: Followed by Palliative Care team in the home, Currently with [facility]. Rema Jasmine, [place] representative updated.  Discussed home health agencies would like to continue to [place].  Will give zip code home health list.    Expected Discharge Date:  01/18/18               Expected Discharge Plan:     In-House Referral:   yes  Discharge planning Services   yes  Post Acute Care Choice:   yes Choice offered to:   patient  DME Arranged:    DME Agency:     HH Arranged:   yes HH Agency:   [facility]  Status of Service:     If discussed at [place] of Stay Meetings, dates discussed:    Additional Comments:  Shelbie Ammons, RN MSN CCM Care Management [phone] 01/18/2018, 9:37 AM

## 2018-01-19 LAB — URINE CULTURE: Culture: 20000 — AB

## 2018-02-04 ENCOUNTER — Telehealth: Payer: Self-pay | Admitting: Urology

## 2018-02-04 NOTE — Telephone Encounter (Signed)
Pt daughter called office states pt is out of catheters, home agency is working getting more, in the meantime they would like to know if they could get a box of catheters from our office. Please advise. Thanks.

## 2018-02-04 NOTE — Telephone Encounter (Signed)
Returned call to patients daughter.  Advised self caths will be placed up front for them to pick up.

## 2018-02-04 NOTE — Telephone Encounter (Signed)
Box of 16FR straight speedicath placed upfront for patient pickup

## 2018-02-10 ENCOUNTER — Telehealth: Payer: Self-pay | Admitting: Urology

## 2018-02-10 NOTE — Telephone Encounter (Signed)
Pt is out of catheters and still hasn't received his from company. Daughter would like to know if she can get more from the clinic until she gets things straightened out with company that is supposed to provide. Please advise [name] at [phone].

## 2018-02-10 NOTE — Telephone Encounter (Signed)
Left message for patients daughter [name]. Box of 16FR straight speedicaths left at front desk for patient pickup.

## 2018-02-18 ENCOUNTER — Encounter: Payer: Self-pay | Admitting: Emergency Medicine

## 2018-02-18 ENCOUNTER — Emergency Department
Admission: EM | Admit: 2018-02-18 | Discharge: 2018-02-18 | Disposition: A | Discharge status: Home / Self Care | Payer: Medicare Other | Attending: Emergency Medicine | Admitting: Emergency Medicine

## 2018-02-18 ENCOUNTER — Emergency Department: Payer: Medicare Other

## 2018-02-18 DIAGNOSIS — E119 Type 2 diabetes mellitus without complications: Secondary | ICD-10-CM | POA: Insufficient documentation

## 2018-02-18 DIAGNOSIS — I5032 Chronic diastolic (congestive) heart failure: Secondary | ICD-10-CM | POA: Insufficient documentation

## 2018-02-18 DIAGNOSIS — N390 Urinary tract infection, site not specified: Secondary | ICD-10-CM | POA: Diagnosis not present

## 2018-02-18 DIAGNOSIS — Z7982 Long term (current) use of aspirin: Secondary | ICD-10-CM | POA: Insufficient documentation

## 2018-02-18 DIAGNOSIS — Z85118 Personal history of other malignant neoplasm of bronchus and lung: Secondary | ICD-10-CM | POA: Insufficient documentation

## 2018-02-18 DIAGNOSIS — Z79899 Other long term (current) drug therapy: Secondary | ICD-10-CM | POA: Diagnosis not present

## 2018-02-18 DIAGNOSIS — Z87891 Personal history of nicotine dependence: Secondary | ICD-10-CM | POA: Diagnosis not present

## 2018-02-18 DIAGNOSIS — R0602 Shortness of breath: Secondary | ICD-10-CM | POA: Diagnosis not present

## 2018-02-18 DIAGNOSIS — R531 Weakness: Secondary | ICD-10-CM | POA: Insufficient documentation

## 2018-02-18 DIAGNOSIS — Z7901 Long term (current) use of anticoagulants: Secondary | ICD-10-CM | POA: Insufficient documentation

## 2018-02-18 LAB — COMPREHENSIVE METABOLIC PANEL
ALT: 17 U/L (ref 0–44)
AST: 26 U/L (ref 15–41)
Albumin: 3.7 g/dL (ref 3.5–5.0)
Alkaline Phosphatase: 89 U/L (ref 38–126)
Anion gap: 10 (ref 5–15)
BUN: 18 mg/dL (ref 8–23)
CO2: 25 mmol/L (ref 22–32)
CREATININE: 1 mg/dL (ref 0.61–1.24)
Calcium: 9.2 mg/dL (ref 8.9–10.3)
Chloride: 102 mmol/L (ref 98–111)
GFR calc non Af Amer: >60 mL/min (ref >60)
Glucose, Bld: 103 mg/dL — ABNORMAL HIGH (ref 70–99)
Potassium: 4.5 mmol/L (ref 3.5–5.1)
Sodium: 137 mmol/L (ref 135–145)
Total Bilirubin: 1 mg/dL (ref 0.3–1.2)
Total Protein: 7.8 g/dL (ref 6.5–8.1)

## 2018-02-18 LAB — URINALYSIS, COMPLETE (UACMP) WITH MICROSCOPIC
Bacteria, UA: NONE SEEN
Bilirubin Urine: NEGATIVE
GLUCOSE, UA: NEGATIVE mg/dL
HGB URINE DIPSTICK: NEGATIVE
Ketones, ur: NEGATIVE mg/dL
Nitrite: NEGATIVE
Protein, ur: NEGATIVE mg/dL
Specific Gravity, Urine: 1.016 (ref 1.005–1.030)
pH: 6 (ref 5.0–8.0)

## 2018-02-18 LAB — TROPONIN I: Troponin I: <0.03 ng/mL (ref <0.03)

## 2018-02-18 LAB — CBC
HCT: 46.8 % (ref 39.0–52.0)
Hemoglobin: 14.8 g/dL (ref 13.0–17.0)
MCH: 29 pg (ref 26.0–34.0)
MCHC: 31.6 g/dL (ref 30.0–36.0)
MCV: 91.6 fL (ref 80.0–100.0)
Platelets: 248 10*3/uL (ref 150–400)
RBC: 5.11 MIL/uL (ref 4.22–5.81)
RDW: 15 % (ref 11.5–15.5)
WBC: 9.4 10*3/uL (ref 4.0–10.5)
nRBC: 0 % (ref 0.0–0.2)

## 2018-02-18 MED ORDER — SODIUM CHLORIDE 0.9 % IV SOLN
1000.0000 mL | Freq: Once | INTRAVENOUS | Status: AC
  Administered 2018-02-18: 1000 mL via INTRAVENOUS

## 2018-02-18 MED ORDER — CIPROFLOXACIN HCL 500 MG PO TABS: 500.0000 mg | ORAL_TABLET | Freq: Two times a day (BID) | ORAL | 0 refills | Status: AC

## 2018-02-18 NOTE — ED Triage Notes (Signed)
Pt arrived to [facility] ED via [facility]. Per EMS pt c/o nausea. Pt lives with daughter (primary caregiver) who told EMS that pt is dehydrated. Per EMS pt has a hx if lung cancer, CHF and pt has a pacemaker. Per EMS pt is on chronic O2 at home 2L via nasal cannula.250cc of NS given on route by [facility] and 4mg  zofran given once for nausea. Upon arrival VSS. NAD noted at this time.

## 2018-02-18 NOTE — ED Notes (Signed)
Dr. Corky Downs at pt's bedside with updates

## 2018-02-18 NOTE — ED Notes (Signed)
Pt c/o generalized weakness. Pt denies CP or SOB. Pt states "I'm just not feeling well" Pt denies any fevers. Pt denies pain at this time. VSS.

## 2018-02-18 NOTE — ED Notes (Signed)
Pt's daughter diffusing essential oil at bedside. This RN spoke with Agricultural consultant and Langley Gauss Rhew regarding policy on diffusion essential oils. This Rn explained to patient's daughter that due to people potentially having a severe allergy or respiratory problems if she wanted to continue to diffuse essential oil that door would have to be closed and a sign warning people  Due to potential allergic reaction. Pt's daughter opted to remove essential oil diffuser at this time.

## 2018-02-18 NOTE — ED Provider Notes (Signed)
[facility] Emergency Department Provider Note   ____________________________________________    I have reviewed the triage vital signs and the nursing notes.   HISTORY  Chief Complaint Weakness    HPI Russell Howard is a 84 y.o. male who presents with complaints of generalized weakness.  Patient reports that he feels overall okay but quite fatigued.  Denies fevers or chills.  Reports chronic shortness of breath and that his breathing is at baseline.  Denies new cough or pleurisy.  No nausea or vomiting or abdominal pain.  Denies myalgias.  Reports a history of dehydration.  He does self cath but denies abdominal or suprapubic pain   Past Medical History:  Diagnosis Date  . Acquired hypothyroidism 11/02/2014  . BPH (benign prostatic hyperplasia)   . Cardiac defibrillator in place 11/02/2014  . Cardiomyopathy ([place])   . CHF (congestive heart failure) ([place])   . Chronic diastolic heart failure ([place]) 11/02/2014  . COPD (chronic obstructive pulmonary disease) ([place])   . Gastro-esophageal reflux disease without esophagitis 11/02/2014  . H/O malignant neoplasm 11/09/2014  . Heart valve disease 11/02/2014  . History of atrial fibrillation 11/02/2014  . Lung cancer ([place])   . Neuropathy 11/02/2014  . Orthostasis 11/02/2014  . Pure hypercholesterolemia 11/02/2014  . Valvular heart disease     Patient Active Problem List   Diagnosis Date Noted  . Acute UTI 10/17/2017  . Altered mental status 08/31/2017  . Advance care planning   . Adjustment disorder with mixed anxiety and depressed mood   . Grief   . Acute respiratory failure ([place]) 06/21/2017  . UTI (urinary tract infection) 11/17/2016  . Empyema ([place])   . Weakness   . Community acquired pneumonia   . Pleural effusion   . Palliative care by specialist   . Acute on chronic respiratory failure with hypoxia ([place]) 08/19/2016  . Acute respiratory failure with hypoxia ([place]) 08/19/2016  . Septic shock ([place])  03/02/2016  . Hydronephrosis, right 01/14/2016  . Constipation 01/13/2016  . Urinary retention 01/13/2016  . Lower abdominal pain 01/13/2016  . Bacteremia 01/13/2016  . Hypotension 01/13/2016  . Palliative care encounter   . Goals of care, counseling/discussion   . Cough   . Encounter for hospice care discussion   . Hypoxia 01/09/2016  . Primary cancer of bronchus of right lower lobe ([place]) 11/29/2015  . Sepsis ([place]) 06/14/2015  . Acute lower UTI 06/14/2015    Class: Acute  . H/O malignant neoplasm 11/09/2014  . Acquired hypothyroidism 11/02/2014  . Cardiomyopathy ([place]) 11/02/2014  . Acute on chronic diastolic CHF (congestive heart failure) ([place]) 11/02/2014  . Diabetes mellitus without complication ([place]) 19/37/9024  . Cardiac defibrillator in place 11/02/2014  . Gastro-esophageal reflux disease without esophagitis 11/02/2014  . History of atrial fibrillation 11/02/2014  . BP (high blood pressure) 11/02/2014  . Neuropathy 11/02/2014  . Orthostasis 11/02/2014  . Pure hypercholesterolemia 11/02/2014  . Heart valve disease 11/02/2014    Past Surgical History:  Procedure Laterality Date  . APPENDECTOMY    . CARDIAC VALVE REPLACEMENT     with a bovine valve  . DUAL ICD IMPLANT  2007/2009   implantable cardiac defibrillator  . PORT-A-CATH REMOVAL      Prior to Admission medications   Medication Sig Start Date End Date Taking? Authorizing Provider  acidophilus (RISAQUAD) CAPS capsule Take 1 capsule by mouth every evening.    Yes [provider]  apixaban (ELIQUIS) 5 MG TABS tablet Take 1 tablet (5 mg  total) by mouth 2 (two) times daily. 09/02/17  Yes [place] Lighter, MD  Ascorbic Acid (VITAMIN C) 1000 MG tablet Take 500 mg by mouth 2 (two) times daily.    Yes [provider]  aspirin EC 81 MG tablet Take 81 mg by mouth daily.    Yes [provider]  Cholecalciferol 5000 units TABS Take 5,000 Units by mouth daily.   Yes [provider]    Coenzyme Q10 (CO Q-10) 120 MG CAPS Take 120 mg by mouth every evening.   Yes [provider]  digoxin (LANOXIN) 0.25 MG tablet Take 0.25 mg by mouth daily.    Yes [provider]  gabapentin (NEURONTIN) 300 MG capsule Take 600 mg by mouth 2 (two) times daily.    Yes [provider]  hydrocortisone (CORTEF) 10 MG tablet Take 10 mg by mouth 2 (two) times daily.    Yes [provider]  levothyroxine (SYNTHROID, LEVOTHROID) 100 MCG tablet Take 100 mcg by mouth daily before breakfast.   Yes [provider]  magnesium oxide (MAG-OX) 400 (241.3 Mg) MG tablet Take 400 mg by mouth every evening.   Yes [provider]  methenamine (HIPREX) 1 g tablet TAKE 1 TABLET BY MOUTH TWICE DAILY WITH MEALS 11/20/17  Yes McGowan, Larene [place] A, PA-C  midodrine (PROAMATINE) 10 MG tablet Take 1 tablet (10 mg total) by mouth 3 (three) times daily as needed. If systolic BP <401 0/27/25  Yes Fritzi Mandes, MD  Multiple Vitamin (MULTIVITAMIN WITH MINERALS) TABS tablet Take 1 tablet by mouth daily.   Yes [provider]  pantoprazole (PROTONIX) 40 MG tablet Take 40 mg by mouth daily before breakfast.    Yes [provider]  pyridostigmine (MESTINON) 60 MG tablet Take 60 mg by mouth 2 (two) times daily. 01/01/18  Yes [provider]  saw palmetto 500 MG capsule Take 1,000 mg by mouth every evening.   Yes [provider]  vitamin B-12 (CYANOCOBALAMIN) 500 MCG tablet Take 500 mcg by mouth daily.    Yes [provider]  vitamin E 400 UNIT capsule Take 400 Units by mouth every evening.    Yes [provider]  ciprofloxacin (CIPRO) 500 MG tablet Take 1 tablet (500 mg total) by mouth 2 (two) times daily for 10 days. 02/18/18 02/28/18  Lavonia Drafts, MD  ondansetron (ZOFRAN) 4 MG tablet Take 4 mg by mouth every 8 (eight) hours as needed for nausea or vomiting.    [provider]  simvastatin (ZOCOR) 20 MG tablet Take 20 mg by  mouth every evening.     [provider]     Allergies Penicillin g and Sulfa antibiotics  Family History  Problem Relation Age of Onset  . Hypertension Other   . Heart disease Other   . CAD Father     Social History Social History   Tobacco Use  . Smoking status: Former Smoker    Types: Cigarettes    Last attempt to quit: 11/21/1981    Years since quitting: 36.2  . Smokeless tobacco: Never Used  Substance Use Topics  . Alcohol use: No    Alcohol/week: 0.0 standard drinks  . Drug use: No    Review of Systems  Constitutional: No fever/chills Eyes: No visual changes.  ENT: No sore throat. Cardiovascular: Denies chest pain. Respiratory: Denies shortness of breath. Gastrointestinal: As above Genitourinary: No suprapubic pain. Musculoskeletal: Negative for back pain. Skin: Negative for rash. Neurological: Negative for headaches   ____________________________________________  PHYSICAL EXAM:  VITAL SIGNS: ED Triage Vitals  Enc Vitals Group     BP 02/18/18 0947 113/76     Pulse Rate 02/18/18 0947 94     Resp 02/18/18 0947 20     Temp 02/18/18 0947 (!) 97.5 F (36.4 C)     Temp Source 02/18/18 0947 Oral     SpO2 02/18/18 0947 98 %     Weight 02/18/18 0949 68 kg (150 lb)     Height 02/18/18 0949 1.778 m (5\' 10" )     Head Circumference --      Peak Flow --      Pain Score 02/18/18 1402 0     Pain Loc --      Pain Edu? --      Excl. in [place]? --     Constitutional: Alert and oriented.  Eyes: Conjunctivae are normal.   Nose: No congestion/rhinnorhea. Mouth/Throat: Mucous membranes are moist.    Cardiovascular: Normal rate, regular rhythm. Grossly normal heart sounds.  Good peripheral circulation. Respiratory: Normal respiratory effort.  No retractions. Lungs CTAB. Gastrointestinal: Soft and nontender. No distention.    Musculoskeletal:   Warm and well perfused Neurologic:  Normal speech and language. No gross focal neurologic deficits are  appreciated.  Skin:  Skin is warm, dry and intact. No rash noted. Psychiatric: Mood and affect are normal. Speech and behavior are normal.  ____________________________________________   LABS (all labs ordered are listed, but only abnormal results are displayed)  Labs Reviewed  COMPREHENSIVE METABOLIC PANEL - Abnormal; Notable for the following components:      Result Value   Glucose, Bld 103 (*)    All other components within normal limits  URINALYSIS, COMPLETE (UACMP) WITH MICROSCOPIC - Abnormal; Notable for the following components:   Color, Urine YELLOW (*)    APPearance CLEAR (*)    Leukocytes, UA MODERATE (*)    All other components within normal limits  CBC  TROPONIN I   ____________________________________________  EKG  ED ECG REPORT I, Lavonia Drafts, the attending physician, personally viewed and interpreted this ECG.  Date: 02/18/2018  Rhythm: Atrial paced rhythm QRS Axis: normal Intervals: Abnormal ST/T Wave abnormalities: Nonspecific change   ____________________________________________  RADIOLOGY  Chest x-ray unremarkable ____________________________________________   PROCEDURES  Procedure(s) performed: No  Procedures   Critical Care performed: No ____________________________________________   INITIAL IMPRESSION / ASSESSMENT AND PLAN / ED COURSE  Pertinent labs & imaging results that were available during my care of the patient were reviewed by me and considered in my medical decision making (see chart for details).  Patient presents with generalized weakness, overall well-appearing, reassuring exam.  Lab work is unremarkable urinalysis demonstrates likely urinary tract infection.  Will place the patient on antibiotics, appropriate for discharge at this time, return precautions discussed    ____________________________________________   FINAL CLINICAL IMPRESSION(S) / ED DIAGNOSES  Final diagnoses:  Lower urinary tract infectious  disease  Generalized weakness        Note:  This document was prepared using Dragon voice recognition software and may include unintentional dictation errors.   Lavonia Drafts, MD 02/18/18 [phone]

## 2018-03-02 ENCOUNTER — Other Ambulatory Visit: Payer: Medicare Other | Admitting: Urology

## 2018-03-03 ENCOUNTER — Ambulatory Visit (INDEPENDENT_AMBULATORY_CARE_PROVIDER_SITE_OTHER): Payer: Medicare Other | Admitting: Urology

## 2018-03-03 ENCOUNTER — Telehealth: Payer: Self-pay | Admitting: Urology

## 2018-03-03 ENCOUNTER — Encounter: Payer: Self-pay | Admitting: Urology

## 2018-03-03 VITALS — BP 104/68 | HR 112

## 2018-03-03 DIAGNOSIS — N390 Urinary tract infection, site not specified: Secondary | ICD-10-CM

## 2018-03-03 DIAGNOSIS — Z87448 Personal history of other diseases of urinary system: Secondary | ICD-10-CM

## 2018-03-03 DIAGNOSIS — R339 Retention of urine, unspecified: Secondary | ICD-10-CM

## 2018-03-03 MED ORDER — GENTAMICIN SULFATE 40 MG/ML IJ SOLN
80.0000 mg | Freq: Once | INTRAMUSCULAR | Status: AC
  Administered 2018-03-03: 80 mg via INTRAMUSCULAR

## 2018-03-03 NOTE — Telephone Encounter (Signed)
Patient has chronic incomplete bladder emptying.

## 2018-03-04 NOTE — Progress Notes (Signed)
   03/04/18  CC:  Chief Complaint  Patient presents with  . Cysto    HPI: 84 year old male with a history of chronic urinary retention managed with clean intermittent catheterization twice daily presents today for annual cystoscopy.  He several times this year with "urinary tract infection" which he most recently grew 10,000 colonies of MRSA.  His symptoms include failure to thrive.   He continues to take hipprex for prevention --> difficulty swallowing this tablet.  Last week, she felt that he may be developing a UTI road 10,000 colonies of mixed flora.  No evidence of UTI.  UA appeared very similar to today's.  He does also have a history of urethral stricture office dilation in 2016.  He has been managing this with self cath tid.  He is unable to void spontaneously.    Blood pressure 113/65, pulse (!) 110, height 5\' 10"  (1.778 m), weight 170 lb (77.1 kg). NED. A&Ox3.   No respiratory distress   Abd soft, NT, ND Normal phallus with bilateral descended testicles  Cystoscopy Procedure Note  Patient identification was confirmed, informed consent was obtained, and patient was prepped using Betadine solution.  Lidocaine jelly was administered per urethral meatus.    Preoperative abx where received prior to procedure.     Pre-Procedure: - Inspection reveals a normal caliber ureteral meatus.  Procedure: The flexible cystoscope was introduced without difficulty - No urethral strictures/lesions are present.  There is a 1 cm narrowed area in the bulbar urethra consistent with previous history of urethral stricture but  passable with a 60 French flexible scope which some manipulation. - Normal prostate with coapting lateral lobes - Normal bladder neck - Bilateral ureteral orifices identified - Bladder mucosa  reveals no ulcers, tumors, or lesions.  - No bladder stones -Mild trabeculation  Retroflexion shows unremarkable without evidence of intravesical  protrusion   Post-Procedure: - Patient tolerated the procedure well  Assessment/ Plan:  1. Recurrent UTI Doing well on Hipprex, continue for UTI prevention- may crush or half Single isolated admission this year for UTI which is improved from previous years Continue to treat as needed for symptomatic infections Reviewed between colonization and infection again today Ppx gentamycin given today light of his recent infection and frailty - Urinalysis, Complete - ciprofloxacin (CIPRO) tablet 500 mg - lidocaine (XYLOCAINE) 2 % jelly 1 application  2. Urinary retention Managed with CIC with tid daily Increase frequency as needed Requires coude tip catheter to accommodate urethral stricture and prostatic anatomy  3. History of urethral stricture As above  Return in about 1 year (around 03/04/2019) for cysto.  Hollice Espy, MD

## 2018-03-19 ENCOUNTER — Observation Stay
Admission: EM | Admit: 2018-03-19 | Discharge: 2018-03-21 | Disposition: A | Discharge status: Home / Self Care | Payer: Medicare Other | Attending: Internal Medicine | Admitting: Internal Medicine

## 2018-03-19 ENCOUNTER — Emergency Department: Payer: Medicare Other

## 2018-03-19 ENCOUNTER — Other Ambulatory Visit: Payer: Self-pay

## 2018-03-19 DIAGNOSIS — Z79899 Other long term (current) drug therapy: Secondary | ICD-10-CM | POA: Diagnosis not present

## 2018-03-19 DIAGNOSIS — Z87891 Personal history of nicotine dependence: Secondary | ICD-10-CM | POA: Diagnosis not present

## 2018-03-19 DIAGNOSIS — E1140 Type 2 diabetes mellitus with diabetic neuropathy, unspecified: Secondary | ICD-10-CM | POA: Insufficient documentation

## 2018-03-19 DIAGNOSIS — E46 Unspecified protein-calorie malnutrition: Secondary | ICD-10-CM | POA: Insufficient documentation

## 2018-03-19 DIAGNOSIS — Z79890 Hormone replacement therapy (postmenopausal): Secondary | ICD-10-CM | POA: Diagnosis not present

## 2018-03-19 DIAGNOSIS — Z85118 Personal history of other malignant neoplasm of bronchus and lung: Secondary | ICD-10-CM | POA: Insufficient documentation

## 2018-03-19 DIAGNOSIS — I429 Cardiomyopathy, unspecified: Secondary | ICD-10-CM | POA: Insufficient documentation

## 2018-03-19 DIAGNOSIS — Z8249 Family history of ischemic heart disease and other diseases of the circulatory system: Secondary | ICD-10-CM | POA: Diagnosis not present

## 2018-03-19 DIAGNOSIS — R110 Nausea: Secondary | ICD-10-CM | POA: Diagnosis present

## 2018-03-19 DIAGNOSIS — I482 Chronic atrial fibrillation, unspecified: Secondary | ICD-10-CM | POA: Diagnosis not present

## 2018-03-19 DIAGNOSIS — Z952 Presence of prosthetic heart valve: Secondary | ICD-10-CM | POA: Diagnosis not present

## 2018-03-19 DIAGNOSIS — E038 Other specified hypothyroidism: Secondary | ICD-10-CM | POA: Diagnosis not present

## 2018-03-19 DIAGNOSIS — J111 Influenza due to unidentified influenza virus with other respiratory manifestations: Secondary | ICD-10-CM

## 2018-03-19 DIAGNOSIS — R531 Weakness: Secondary | ICD-10-CM | POA: Diagnosis present

## 2018-03-19 DIAGNOSIS — J449 Chronic obstructive pulmonary disease, unspecified: Secondary | ICD-10-CM | POA: Insufficient documentation

## 2018-03-19 DIAGNOSIS — R627 Adult failure to thrive: Secondary | ICD-10-CM | POA: Insufficient documentation

## 2018-03-19 DIAGNOSIS — N400 Enlarged prostate without lower urinary tract symptoms: Secondary | ICD-10-CM | POA: Diagnosis not present

## 2018-03-19 DIAGNOSIS — J9611 Chronic respiratory failure with hypoxia: Secondary | ICD-10-CM | POA: Diagnosis not present

## 2018-03-19 DIAGNOSIS — Z7901 Long term (current) use of anticoagulants: Secondary | ICD-10-CM | POA: Insufficient documentation

## 2018-03-19 DIAGNOSIS — Z7982 Long term (current) use of aspirin: Secondary | ICD-10-CM | POA: Diagnosis not present

## 2018-03-19 DIAGNOSIS — J101 Influenza due to other identified influenza virus with other respiratory manifestations: Secondary | ICD-10-CM | POA: Diagnosis not present

## 2018-03-19 DIAGNOSIS — I5032 Chronic diastolic (congestive) heart failure: Secondary | ICD-10-CM | POA: Insufficient documentation

## 2018-03-19 DIAGNOSIS — Z66 Do not resuscitate: Secondary | ICD-10-CM | POA: Insufficient documentation

## 2018-03-19 DIAGNOSIS — K219 Gastro-esophageal reflux disease without esophagitis: Secondary | ICD-10-CM | POA: Diagnosis not present

## 2018-03-19 DIAGNOSIS — F4323 Adjustment disorder with mixed anxiety and depressed mood: Secondary | ICD-10-CM | POA: Insufficient documentation

## 2018-03-19 DIAGNOSIS — E7800 Pure hypercholesterolemia, unspecified: Secondary | ICD-10-CM | POA: Diagnosis not present

## 2018-03-19 DIAGNOSIS — Z95810 Presence of automatic (implantable) cardiac defibrillator: Secondary | ICD-10-CM | POA: Insufficient documentation

## 2018-03-19 LAB — URINALYSIS, COMPLETE (UACMP) WITH MICROSCOPIC
Bacteria, UA: NONE SEEN
Bilirubin Urine: NEGATIVE
Glucose, UA: NEGATIVE mg/dL
Hgb urine dipstick: NEGATIVE
KETONES UR: NEGATIVE mg/dL
Nitrite: NEGATIVE
Protein, ur: NEGATIVE mg/dL
Specific Gravity, Urine: 1.014 (ref 1.005–1.030)
pH: 6 (ref 5.0–8.0)

## 2018-03-19 LAB — COMPREHENSIVE METABOLIC PANEL
ALT: 15 U/L (ref 0–44)
AST: 20 U/L (ref 15–41)
Albumin: 3.8 g/dL (ref 3.5–5.0)
Alkaline Phosphatase: 72 U/L (ref 38–126)
Anion gap: 7 (ref 5–15)
BUN: 18 mg/dL (ref 8–23)
CO2: 28 mmol/L (ref 22–32)
Calcium: 9.1 mg/dL (ref 8.9–10.3)
Chloride: 101 mmol/L (ref 98–111)
Creatinine, Ser: 0.98 mg/dL (ref 0.61–1.24)
GFR calc Af Amer: >60 mL/min (ref >60)
GFR calc non Af Amer: >60 mL/min (ref >60)
Glucose, Bld: 127 mg/dL — ABNORMAL HIGH (ref 70–99)
Potassium: 4.4 mmol/L (ref 3.5–5.1)
SODIUM: 136 mmol/L (ref 135–145)
Total Bilirubin: 0.7 mg/dL (ref 0.3–1.2)
Total Protein: 7.5 g/dL (ref 6.5–8.1)

## 2018-03-19 LAB — CBC
HCT: 42.5 % (ref 39.0–52.0)
Hemoglobin: 13.6 g/dL (ref 13.0–17.0)
MCH: 28.9 pg (ref 26.0–34.0)
MCHC: 32 g/dL (ref 30.0–36.0)
MCV: 90.4 fL (ref 80.0–100.0)
Platelets: 197 10*3/uL (ref 150–400)
RBC: 4.7 MIL/uL (ref 4.22–5.81)
RDW: 14.9 % (ref 11.5–15.5)
WBC: 11.3 10*3/uL — ABNORMAL HIGH (ref 4.0–10.5)
nRBC: 0 % (ref 0.0–0.2)

## 2018-03-19 LAB — INFLUENZA PANEL BY PCR (TYPE A & B)
Influenza A By PCR: POSITIVE — AB
Influenza B By PCR: NEGATIVE

## 2018-03-19 LAB — MRSA PCR SCREENING: MRSA by PCR: POSITIVE — AB

## 2018-03-19 LAB — TROPONIN I: Troponin I: <0.03 ng/mL (ref <0.03)

## 2018-03-19 LAB — DIGOXIN LEVEL: Digoxin Level: 1.9 ng/mL (ref 0.8–2.0)

## 2018-03-19 MED ORDER — SIMVASTATIN 20 MG PO TABS
20.0000 mg | ORAL_TABLET | Freq: Every evening | ORAL | Status: DC
  Administered 2018-03-19 – 2018-03-20 (×2): 20 mg via ORAL
  Filled 2018-03-19 (×2): qty 1

## 2018-03-19 MED ORDER — ONDANSETRON HCL 4 MG PO TABS: 4.0000 mg | ORAL_TABLET | Freq: Three times a day (TID) | ORAL | Status: DC | PRN

## 2018-03-19 MED ORDER — MAGNESIUM OXIDE 400 (241.3 MG) MG PO TABS
400.0000 mg | ORAL_TABLET | Freq: Every evening | ORAL | Status: DC
  Administered 2018-03-19 – 2018-03-20 (×2): 400 mg via ORAL
  Filled 2018-03-19 (×2): qty 1

## 2018-03-19 MED ORDER — ORAL CARE MOUTH RINSE
15.0000 mL | Freq: Two times a day (BID) | OROMUCOSAL | Status: DC
  Administered 2018-03-19 – 2018-03-21 (×2): 15 mL via OROMUCOSAL

## 2018-03-19 MED ORDER — IBUPROFEN 400 MG PO TABS
400.0000 mg | ORAL_TABLET | Freq: Three times a day (TID) | ORAL | Status: DC | PRN
  Administered 2018-03-19: 400 mg via ORAL
  Filled 2018-03-19: qty 1

## 2018-03-19 MED ORDER — ONDANSETRON HCL 4 MG/2ML IJ SOLN
4.0000 mg | Freq: Four times a day (QID) | INTRAMUSCULAR | Status: DC | PRN
  Administered 2018-03-19 – 2018-03-20 (×3): 4 mg via INTRAVENOUS
  Filled 2018-03-19 (×3): qty 2

## 2018-03-19 MED ORDER — GUAIFENESIN ER 600 MG PO TB12
600.0000 mg | ORAL_TABLET | Freq: Two times a day (BID) | ORAL | Status: DC
  Administered 2018-03-19 – 2018-03-21 (×5): 600 mg via ORAL
  Filled 2018-03-19 (×6): qty 1

## 2018-03-19 MED ORDER — ONDANSETRON HCL 4 MG/2ML IJ SOLN
4.0000 mg | Freq: Once | INTRAMUSCULAR | Status: AC
  Administered 2018-03-19: 4 mg via INTRAVENOUS
  Filled 2018-03-19: qty 2

## 2018-03-19 MED ORDER — BUDESONIDE 0.25 MG/2ML IN SUSP: 0.2500 mg | Freq: Two times a day (BID) | RESPIRATORY_TRACT | Status: DC

## 2018-03-19 MED ORDER — RISAQUAD PO CAPS
1.0000 | ORAL_CAPSULE | Freq: Every evening | ORAL | Status: DC
  Administered 2018-03-19 – 2018-03-20 (×2): 1 via ORAL
  Filled 2018-03-19 (×2): qty 1

## 2018-03-19 MED ORDER — OSELTAMIVIR PHOSPHATE 75 MG PO CAPS
75.0000 mg | ORAL_CAPSULE | Freq: Once | ORAL | Status: AC
  Administered 2018-03-19: 75 mg via ORAL
  Filled 2018-03-19: qty 1

## 2018-03-19 MED ORDER — GUAIFENESIN-DM 100-10 MG/5ML PO SYRP
5.0000 mL | ORAL_SOLUTION | Freq: Four times a day (QID) | ORAL | Status: DC | PRN
  Filled 2018-03-19: qty 5

## 2018-03-19 MED ORDER — OSELTAMIVIR PHOSPHATE 75 MG PO CAPS
75.0000 mg | ORAL_CAPSULE | Freq: Two times a day (BID) | ORAL | Status: DC
  Administered 2018-03-19 – 2018-03-21 (×5): 75 mg via ORAL
  Filled 2018-03-19 (×5): qty 1

## 2018-03-19 MED ORDER — SODIUM CHLORIDE 0.9 % IV BOLUS
250.0000 mL | Freq: Once | INTRAVENOUS | Status: AC
  Administered 2018-03-19: 13:00:00 250 mL via INTRAVENOUS

## 2018-03-19 MED ORDER — VITAMIN D 25 MCG (1000 UNIT) PO TABS
5000.0000 [IU] | ORAL_TABLET | Freq: Every day | ORAL | Status: DC
  Administered 2018-03-19 – 2018-03-21 (×2): 5000 [IU] via ORAL
  Filled 2018-03-19 (×3): qty 5

## 2018-03-19 MED ORDER — PANTOPRAZOLE SODIUM 40 MG PO TBEC
40.0000 mg | DELAYED_RELEASE_TABLET | Freq: Every day | ORAL | Status: DC
  Administered 2018-03-19 – 2018-03-21 (×2): 40 mg via ORAL
  Filled 2018-03-19 (×3): qty 1

## 2018-03-19 MED ORDER — SODIUM CHLORIDE 0.9 % IV BOLUS: 250.0000 mL | INTRAVENOUS | Status: AC

## 2018-03-19 MED ORDER — CYANOCOBALAMIN 500 MCG PO TABS
500.0000 ug | ORAL_TABLET | Freq: Every day | ORAL | Status: DC
  Administered 2018-03-19 – 2018-03-21 (×2): 500 ug via ORAL
  Filled 2018-03-19 (×3): qty 1

## 2018-03-19 MED ORDER — LEVOTHYROXINE SODIUM 100 MCG PO TABS
100.0000 ug | ORAL_TABLET | Freq: Every day | ORAL | Status: DC
  Administered 2018-03-19 – 2018-03-21 (×3): 100 ug via ORAL
  Filled 2018-03-19 (×3): qty 1

## 2018-03-19 MED ORDER — ALBUTEROL SULFATE (2.5 MG/3ML) 0.083% IN NEBU
2.5000 mg | INHALATION_SOLUTION | Freq: Four times a day (QID) | RESPIRATORY_TRACT | Status: DC
  Administered 2018-03-19 – 2018-03-20 (×4): 2.5 mg via RESPIRATORY_TRACT
  Filled 2018-03-19 (×6): qty 3

## 2018-03-19 MED ORDER — MIDODRINE HCL 5 MG PO TABS
10.0000 mg | ORAL_TABLET | Freq: Three times a day (TID) | ORAL | Status: DC | PRN
  Administered 2018-03-20: 10 mg via ORAL
  Filled 2018-03-19: qty 2

## 2018-03-19 MED ORDER — ASPIRIN EC 81 MG PO TBEC
81.0000 mg | DELAYED_RELEASE_TABLET | Freq: Every day | ORAL | Status: DC
  Administered 2018-03-19 – 2018-03-21 (×3): 81 mg via ORAL
  Filled 2018-03-19 (×3): qty 1

## 2018-03-19 MED ORDER — HYDROCODONE-ACETAMINOPHEN 5-325 MG PO TABS: 1.0000 | ORAL_TABLET | ORAL | Status: DC | PRN

## 2018-03-19 MED ORDER — ACETAMINOPHEN 650 MG RE SUPP: 650.0000 mg | Freq: Four times a day (QID) | RECTAL | Status: DC | PRN

## 2018-03-19 MED ORDER — ONDANSETRON HCL 4 MG PO TABS: 4.0000 mg | ORAL_TABLET | Freq: Four times a day (QID) | ORAL | Status: DC | PRN

## 2018-03-19 MED ORDER — ADULT MULTIVITAMIN W/MINERALS CH
1.0000 | ORAL_TABLET | Freq: Every day | ORAL | Status: DC
  Administered 2018-03-19 – 2018-03-21 (×2): 1 via ORAL
  Filled 2018-03-19 (×4): qty 1

## 2018-03-19 MED ORDER — HYDROCORTISONE 10 MG PO TABS
10.0000 mg | ORAL_TABLET | Freq: Two times a day (BID) | ORAL | Status: DC
  Administered 2018-03-19 – 2018-03-21 (×5): 10 mg via ORAL
  Filled 2018-03-19 (×6): qty 1

## 2018-03-19 MED ORDER — GABAPENTIN 300 MG PO CAPS
600.0000 mg | ORAL_CAPSULE | Freq: Two times a day (BID) | ORAL | Status: DC
  Administered 2018-03-19 – 2018-03-21 (×4): 600 mg via ORAL
  Filled 2018-03-19 (×4): qty 2

## 2018-03-19 MED ORDER — ACETAMINOPHEN 325 MG PO TABS: 650.0000 mg | ORAL_TABLET | Freq: Four times a day (QID) | ORAL | Status: DC | PRN

## 2018-03-19 MED ORDER — ENSURE ENLIVE PO LIQD
237.0000 mL | Freq: Two times a day (BID) | ORAL | Status: DC
  Administered 2018-03-19: 237 mL via ORAL

## 2018-03-19 MED ORDER — IPRATROPIUM-ALBUTEROL 0.5-2.5 (3) MG/3ML IN SOLN: 3.0000 mL | RESPIRATORY_TRACT | Status: DC | PRN

## 2018-03-19 MED ORDER — SODIUM CHLORIDE 0.9 % IV BOLUS: 500.0000 mL | INTRAVENOUS | Status: DC

## 2018-03-19 MED ORDER — APIXABAN 5 MG PO TABS
5.0000 mg | ORAL_TABLET | Freq: Two times a day (BID) | ORAL | Status: DC
  Administered 2018-03-19 – 2018-03-21 (×5): 5 mg via ORAL
  Filled 2018-03-19 (×6): qty 1

## 2018-03-19 MED ORDER — DIGOXIN 250 MCG PO TABS
0.2500 mg | ORAL_TABLET | Freq: Every day | ORAL | Status: DC
  Administered 2018-03-19 – 2018-03-21 (×3): 0.25 mg via ORAL
  Filled 2018-03-19 (×3): qty 1

## 2018-03-19 MED ORDER — VITAMIN E 180 MG (400 UNIT) PO CAPS
400.0000 [IU] | ORAL_CAPSULE | Freq: Every evening | ORAL | Status: DC
  Administered 2018-03-20: 400 [IU] via ORAL
  Filled 2018-03-19 (×3): qty 1

## 2018-03-19 MED ORDER — DOCUSATE SODIUM 100 MG PO CAPS
100.0000 mg | ORAL_CAPSULE | Freq: Two times a day (BID) | ORAL | Status: DC
  Administered 2018-03-21: 100 mg via ORAL
  Filled 2018-03-19 (×5): qty 1

## 2018-03-19 MED ORDER — METHENAMINE MANDELATE 1 G PO TABS
1.0000 g | ORAL_TABLET | Freq: Two times a day (BID) | ORAL | Status: DC
  Administered 2018-03-19 – 2018-03-20 (×3): 1 g via ORAL
  Filled 2018-03-19: qty 1

## 2018-03-19 MED ORDER — PYRIDOSTIGMINE BROMIDE 60 MG PO TABS
60.0000 mg | ORAL_TABLET | Freq: Two times a day (BID) | ORAL | Status: DC
  Administered 2018-03-19 – 2018-03-21 (×5): 60 mg via ORAL
  Filled 2018-03-19 (×6): qty 1

## 2018-03-19 MED ORDER — BUDESONIDE 0.5 MG/2ML IN SUSP: 0.5000 mg | Freq: Two times a day (BID) | RESPIRATORY_TRACT | Status: DC

## 2018-03-19 MED ORDER — DEXTROSE-NACL 5-0.45 % IV SOLN
INTRAVENOUS | Status: DC
  Administered 2018-03-19 (×2): via INTRAVENOUS

## 2018-03-19 MED ORDER — CO Q-10 120 MG PO CAPS: 120.0000 mg | ORAL_CAPSULE | Freq: Every evening | ORAL | Status: DC

## 2018-03-19 MED ORDER — VITAMIN C 500 MG PO TABS
500.0000 mg | ORAL_TABLET | Freq: Two times a day (BID) | ORAL | Status: DC
  Administered 2018-03-19 – 2018-03-21 (×4): 500 mg via ORAL
  Filled 2018-03-19 (×5): qty 1

## 2018-03-19 MED ORDER — BUDESONIDE 0.25 MG/2ML IN SUSP
0.5000 mg | Freq: Two times a day (BID) | RESPIRATORY_TRACT | Status: DC
  Administered 2018-03-19 – 2018-03-21 (×3): 0.5 mg via RESPIRATORY_TRACT
  Filled 2018-03-19 (×5): qty 4

## 2018-03-19 NOTE — Progress Notes (Signed)
Pt refusing to eat unless diet changed. MD notified and Diet has been changed per pt. Request.

## 2018-03-19 NOTE — Progress Notes (Signed)
Noted BP of 74/29. Alerted MD. Placed in trendelenburg and called rapid. Pt BP increased to pt baseline range  102/63 map of 76 inTrandelenberg,  BP remained consistently in pt baseline range while lying flat. Pt declined to sit up. Will continue to monitor.

## 2018-03-19 NOTE — Care Management Obs Status (Signed)
[facility] NOTIFICATION   Patient Details  Name: Kurtiss Wence MRN: [id number] Date of Birth: 06-Jul-1934   Medicare Observation Status Notification Given:  Yes    Shelbie Ammons, RN 03/19/2018, 11:28 AM

## 2018-03-19 NOTE — H&P (Signed)
[facility] [place] NAME: Russell Howard    MR#:  [id number]  DATE OF BIRTH:  [birth year]  DATE OF ADMISSION:  03/19/2018  PRIMARY CARE PHYSICIAN: [name], MD   REQUESTING/REFERRING PHYSICIAN:   CHIEF COMPLAINT:   Chief Complaint  Patient presents with  . Nausea    HISTORY OF PRESENT ILLNESS: Russell Howard  is a 84 y.o. male with a known history per below presenting from home via EMS with complaints of severe generalized weakness, fatigue, fevers, nausea, dry heaves, cough, and emergency room work-up noted for white count 11,000, influenza A positive, patient evaluated in the emergency room, daughter at the bedside, patient no apparent distress, resting comfortably in bed, patient is now being admitted for acute influenza A infection.  PAST MEDICAL HISTORY:   Past Medical History:  Diagnosis Date  . Acquired hypothyroidism 11/02/2014  . BPH (benign prostatic hyperplasia)   . Cardiac defibrillator in place 11/02/2014  . Cardiomyopathy ([place])   . CHF (congestive heart failure) ([place])   . Chronic diastolic heart failure ([place]) 11/02/2014  . COPD (chronic obstructive pulmonary disease) ([place])   . Gastro-esophageal reflux disease without esophagitis 11/02/2014  . H/O malignant neoplasm 11/09/2014  . Heart valve disease 11/02/2014  . History of atrial fibrillation 11/02/2014  . Lung cancer ([place])   . Neuropathy 11/02/2014  . Orthostasis 11/02/2014  . Pure hypercholesterolemia 11/02/2014  . Valvular heart disease     PAST SURGICAL HISTORY:  Past Surgical History:  Procedure Laterality Date  . APPENDECTOMY    . CARDIAC VALVE REPLACEMENT     with a bovine valve  . DUAL ICD IMPLANT  2007/2009   implantable cardiac defibrillator  . PORT-A-CATH REMOVAL      SOCIAL HISTORY:  Social History   Tobacco Use  . Smoking status: Former Smoker    Types: Cigarettes    Last attempt to quit: 11/21/1981    Years since quitting: 36.3  . Smokeless  tobacco: Never Used  Substance Use Topics  . Alcohol use: No    Alcohol/week: 0.0 standard drinks    FAMILY HISTORY:  Family History  Problem Relation Age of Onset  . Hypertension Other   . Heart disease Other   . CAD Father     DRUG ALLERGIES:  Allergies  Allergen Reactions  . Penicillin G Hives    Has patient had a PCN reaction causing immediate rash, facial/tongue/throat swelling, SOB or lightheadedness with hypotension: Yes Has patient had a PCN reaction causing severe rash involving mucus membranes or skin necrosis: No Has patient had a PCN reaction that required hospitalization No Has patient had a PCN reaction occurring within the last 10 years: No If all of the above answers are "NO", then may proceed with Cephalosporin use.  . Sulfa Antibiotics Rash    REVIEW OF SYSTEMS:   CONSTITUTIONAL: + fever, fatigue, weakness.  EYES: No blurred or double vision.  EARS, NOSE, AND THROAT: No tinnitus or ear pain.  RESPIRATORY: + cough, shortness of breath, wheezing CARDIOVASCULAR: No chest pain, orthopnea, edema.  GASTROINTESTINAL: + nausea, no vomiting, diarrhea or abdominal pain.  GENITOURINARY: No dysuria, hematuria.  ENDOCRINE: No polyuria, nocturia,  HEMATOLOGY: No anemia, easy bruising or bleeding SKIN: No rash or lesion. MUSCULOSKELETAL: No joint pain or arthritis.   NEUROLOGIC: No tingling, numbness, weakness.  PSYCHIATRY: No anxiety or depression.   MEDICATIONS AT HOME:  Prior to Admission medications   Medication Sig Start Date End Date Taking? Authorizing  Provider  acidophilus (RISAQUAD) CAPS capsule Take 1 capsule by mouth every evening.     [provider]  apixaban (ELIQUIS) 5 MG TABS tablet Take 1 tablet (5 mg total) by mouth 2 (two) times daily. 09/02/17   Gladstone Lighter, MD  Ascorbic Acid (VITAMIN C) 1000 MG tablet Take 500 mg by mouth 2 (two) times daily.     [provider]  aspirin EC 81 MG tablet Take 81 mg by mouth daily.      [provider]  Cholecalciferol 5000 units TABS Take 5,000 Units by mouth daily.    [provider]  Coenzyme Q10 (CO Q-10) 120 MG CAPS Take 120 mg by mouth every evening.    [provider]  digoxin (LANOXIN) 0.25 MG tablet Take 0.25 mg by mouth daily.     [provider]  gabapentin (NEURONTIN) 300 MG capsule Take 600 mg by mouth 2 (two) times daily.     [provider]  hydrocortisone (CORTEF) 10 MG tablet Take 10 mg by mouth 2 (two) times daily.     [provider]  levothyroxine (SYNTHROID, LEVOTHROID) 100 MCG tablet Take 100 mcg by mouth daily before breakfast.    [provider]  magnesium oxide (MAG-OX) 400 (241.3 Mg) MG tablet Take 400 mg by mouth every evening.    [provider]  methenamine (HIPREX) 1 g tablet TAKE 1 TABLET BY MOUTH TWICE DAILY WITH MEALS 11/20/17   McGowan, Larene [place] A, PA-C  midodrine (PROAMATINE) 10 MG tablet Take 1 tablet (10 mg total) by mouth 3 (three) times daily as needed. If systolic BP <270 3/50/09   Fritzi Mandes, MD  Multiple Vitamin (MULTIVITAMIN WITH MINERALS) TABS tablet Take 1 tablet by mouth daily.    [provider]  ondansetron (ZOFRAN) 4 MG tablet Take 4 mg by mouth every 8 (eight) hours as needed for nausea or vomiting.    [provider]  pantoprazole (PROTONIX) 40 MG tablet Take 40 mg by mouth daily before breakfast.     [provider]  pyridostigmine (MESTINON) 60 MG tablet Take 60 mg by mouth 2 (two) times daily. 01/01/18   [provider]  saw palmetto 500 MG capsule Take 1,000 mg by mouth every evening.    [provider]  simvastatin (ZOCOR) 20 MG tablet Take 20 mg by mouth every evening.     [provider]  vitamin B-12 (CYANOCOBALAMIN) 500 MCG tablet Take 500 mcg by mouth daily.     [provider]  vitamin E 400 UNIT capsule Take 400 Units by mouth every evening.     [provider]      PHYSICAL  EXAMINATION:   VITAL SIGNS: Blood pressure 116/62, pulse 82, temperature 98.6 F (37 C), temperature source Oral, resp. rate 20, height 5\' 10"  (1.778 m), weight 81.6 kg, SpO2 98 %.  GENERAL:  84 y.o.-year-old patient lying in the bed with no acute distress.  Malnourished appearance, nontoxic appearing EYES: Pupils equal, round, reactive to light and accommodation. No scleral icterus. Extraocular muscles intact.  HEENT: Head atraumatic, normocephalic. Oropharynx and nasopharynx clear.  NECK:  Supple, no jugular venous distention. No thyroid enlargement, no tenderness.  LUNGS: Normal breath sounds bilaterally, no wheezing, rales,rhonchi or crepitation. No use of accessory muscles of respiration.  CARDIOVASCULAR: S1, S2 normal. No murmurs, rubs, or gallops.  ABDOMEN: Soft, nontender, nondistended. Bowel sounds present. No organomegaly or mass.  EXTREMITIES: No pedal edema, cyanosis, or clubbing.  Diffuse muscular wasting  NEUROLOGIC: Cranial nerves II through XII are intact. MAES Gait not checked.  PSYCHIATRIC: The patient is alert and oriented x 3.  SKIN: No obvious rash, lesion, or ulcer.   LABORATORY PANEL:   CBC Recent Labs  Lab 03/19/18 0132  WBC 11.3*  HGB 13.6  HCT 42.5  PLT 197  MCV 90.4  MCH 28.9  MCHC 32.0  RDW 14.9   ------------------------------------------------------------------------------------------------------------------  Chemistries  Recent Labs  Lab 03/19/18 0132  NA 136  K 4.4  CL 101  CO2 28  GLUCOSE 127*  BUN 18  CREATININE 0.98  CALCIUM 9.1  AST 20  ALT 15  ALKPHOS 72  BILITOT 0.7   ------------------------------------------------------------------------------------------------------------------ estimated creatinine clearance is 61 mL/min (by C-G formula based on SCr of 0.98 mg/dL). ------------------------------------------------------------------------------------------------------------------ No results for input(s): TSH, T4TOTAL, T3FREE,  THYROIDAB in the last 72 hours.  Invalid input(s): FREET3   Coagulation profile No results for input(s): INR, PROTIME in the last 168 hours. ------------------------------------------------------------------------------------------------------------------- No results for input(s): DDIMER in the last 72 hours. -------------------------------------------------------------------------------------------------------------------  Cardiac Enzymes Recent Labs  Lab 03/19/18 0132  TROPONINI <0.03   ------------------------------------------------------------------------------------------------------------------ Invalid input(s): POCBNP  ---------------------------------------------------------------------------------------------------------------  Urinalysis    Component Value Date/Time   COLORURINE YELLOW (A) 02/18/2018 0953   APPEARANCEUR CLEAR (A) 02/18/2018 0953   APPEARANCEUR Clear 10/21/2017 1544   LABSPEC 1.016 02/18/2018 [place] 6.0 02/18/2018 0953   GLUCOSEU NEGATIVE 02/18/2018 0953   HGBUR NEGATIVE 02/18/2018 0953   BILIRUBINUR NEGATIVE 02/18/2018 0953   BILIRUBINUR Negative 12/29/2017 1512   BILIRUBINUR Negative 10/21/2017 1544   KETONESUR NEGATIVE 02/18/2018 0953   PROTEINUR NEGATIVE 02/18/2018 0953   UROBILINOGEN 0.2 12/29/2017 1512   NITRITE NEGATIVE 02/18/2018 0953   LEUKOCYTESUR MODERATE (A) 02/18/2018 0953   LEUKOCYTESUR Negative 10/21/2017 1544     RADIOLOGY: Dg Chest Port 1 View  Result Date: 03/19/2018 CLINICAL DATA:  Cough, weakness EXAM: PORTABLE CHEST 1 VIEW COMPARISON:  02/18/2018 FINDINGS: Left AICD remains in place, unchanged. Severe diffuse right lung airspace disease and pleural thickening again noted, unchanged. Interstitial prominence throughout the left lung. Findings are stable dating back to remote studies in early 2019. No definite acute process. IMPRESSION: Severe chronic changes with airspace disease and pleural thickening in the right  hemithorax and interstitial prominence on the left. No definite acute process. Electronically Signed   By: Rolm Baptise M.D.   On: 03/19/2018 02:03    EKG: Orders placed or performed during the hospital encounter of 03/19/18  . EKG 12-Lead  . EKG 12-Lead  . EKG 12-Lead  . EKG 12-Lead    IMPRESSION AND PLAN: *Acute influenza A infection Referred to the observation unit, Tamiflu for 5-day course, supportive care, gentle IV fluids for rehydration given history of heart failure, Tylenol as needed fevers, antiemetics PRN  *Chronic A. Fib Stable Continue Eliquis and digoxin-check level in the morning  *History of congestive heart failure Without exacerbation Continue aspirin, on Eliquis, heart healthy/low-sodium diet, statin therapy  *Chronic cardiomyopathy Stable Status post AICD Conservative medical management  *COPD without exacerbation Stable Breathing treatments PRN On 2 L via nasal cannula chronically at home  *Chronic hypoxic respiratory failure Stable On 2 L chronically at home  *Chronic adult failure to thrive, deconditioning, malnutrition Increase nursing care PRN, aspiration/fall/skin care precautions while in house, dietary consulted  *History of hypotension Continue Midodrine as needed  All the records are reviewed and case discussed with ED provider. Management plans discussed with the patient, family and they are in agreement.  CODE STATUS:DNR Code Status History    Date Active Date Inactive Code Status Order ID Comments User Context   01/15/2018 1909 01/18/2018 1510 DNR [id number]  Loletha Grayer, MD ED   10/17/2017 2259 10/20/2017 1812 DNR [id number]  Amelia Jo, MD Inpatient   08/31/2017 2104 09/02/2017 1540 DNR [id number]  Gladstone Lighter, MD Inpatient   06/21/2017 1420 06/24/2017 1512 DNR [id number]  Dustin Flock, MD Inpatient   05/22/2017 1609 05/29/2017 1740 DNR [id number]  Gladstone Lighter, MD Inpatient   11/17/2016 1631 11/20/2016 1602 DNR  [id number]  Vaughan Basta, MD Inpatient   08/19/2016 1315 08/30/2016 1728 DNR [id number]  Harrie Foreman, MD Inpatient   03/02/2016 2236 03/05/2016 2020 DNR [id number]  Mikael Spray, NP ED   01/09/2016 0658 01/14/2016 1939 DNR [id number]  Harrie Foreman, MD Inpatient   11/09/2015 0318 11/12/2015 1713 DNR [id number]  Harrie Foreman, MD Inpatient   06/14/2015 1639 06/18/2015 1513 DNR [id number]  Demetrios Loll, MD Inpatient    Questions for Most Recent Historical Code Status (Order [id number])    Question Answer Comment   In the event of cardiac or respiratory ARREST Do not call a "code blue"    In the event of cardiac or respiratory ARREST Do not perform Intubation, CPR, defibrillation or ACLS    In the event of cardiac or respiratory ARREST Use medication by any route, position, wound care, and other measures to relive pain and suffering. May use oxygen, suction and manual treatment of airway obstruction as needed for comfort.    Comments nurse may pronounce         Advance Directive Documentation     Most Recent Value  Type of Advance Directive  Out of facility DNR (pink MOST or yellow form)  Pre-existing out of facility DNR order (yellow form or pink MOST form)  Yellow form placed in chart (order not valid for inpatient use)  "MOST" Form in Place?  -       TOTAL TIME TAKING CARE OF THIS PATIENT: 40 minutes.    Avel Peace Nayellie Sanseverino M.D on 03/19/2018   Between 7am to 6pm - Pager - [phone]  After 6pm go to [email or web address] - password [email or web address] [facility]  Office  [phone]  CC: Primary care physician; [name], MD   Note: This dictation was prepared with Dragon dictation along with smaller phrase technology. Any transcriptional errors that result from this process are unintentional.

## 2018-03-19 NOTE — Progress Notes (Signed)
[facility] at [place] NAME: Russell Howard    MR#:  [id number]  DATE OF BIRTH:  08-13-1934  SUBJECTIVE:  CHIEF COMPLAINT:   Patient is nauseous, hypotensive and tachycardic Not vomiting.  The daughter-in-law at bedside.  REVIEW OF SYSTEMS:  CONSTITUTIONAL: No fever, fatigue . Feeling weak and tired EYES: No blurred or double vision.  EARS, NOSE, AND THROAT: No tinnitus or ear pain.  RESPIRATORY: Intermittent episodes of cough, shortness of breath, denies wheezing or hemoptysis.  CARDIOVASCULAR: No chest pain, orthopnea, edema.  GASTROINTESTINAL: Reports nausea, denies vomiting, diarrhea or abdominal pain.  GENITOURINARY: No dysuria, hematuria.  ENDOCRINE: No polyuria, nocturia,  HEMATOLOGY: No anemia, easy bruising or bleeding SKIN: No rash or lesion. MUSCULOSKELETAL: No joint pain or arthritis.   NEUROLOGIC: No tingling, numbness, weakness.  PSYCHIATRY: No anxiety or depression.   DRUG ALLERGIES:   Allergies  Allergen Reactions  . Penicillin G Hives    Has patient had a PCN reaction causing immediate rash, facial/tongue/throat swelling, SOB or lightheadedness with hypotension: Yes Has patient had a PCN reaction causing severe rash involving mucus membranes or skin necrosis: No Has patient had a PCN reaction that required hospitalization No Has patient had a PCN reaction occurring within the last 10 years: No If all of the above answers are "NO", then may proceed with Cephalosporin use.  . Sulfa Antibiotics Rash    VITALS:  Blood pressure (!) 88/48, pulse (!) 116, temperature 98.2 F (36.8 C), temperature source Oral, resp. rate (!) 24, height 5\' 10"  (1.778 m), weight 81.6 kg, SpO2 97 %.  PHYSICAL EXAMINATION:  GENERAL:  84 y.o.-year-old patient lying in the bed with no acute distress.  EYES: Pupils equal, round, reactive to light and accommodation. No scleral icterus. Extraocular muscles intact.  HEENT: Head atraumatic,  normocephalic. Oropharynx and nasopharynx clear.  NECK:  Supple, no jugular venous distention. No thyroid enlargement, no tenderness.  LUNGS: Moderately diminished breath sounds bilaterally, no wheezing, rales,rhonchi or crepitation. No use of accessory muscles of respiration.  CARDIOVASCULAR: S1, S2 normal. No murmurs, rubs, or gallops.  ABDOMEN: Soft, nontender, nondistended. Bowel sounds present.  EXTREMITIES: No pedal edema, cyanosis, or clubbing.  NEUROLOGIC: Awake, alert and oriented x3. Sensation intact. Gait not checked.  PSYCHIATRIC: The patient is alert and oriented x 3.  SKIN: No obvious rash, lesion, or ulcer.    LABORATORY PANEL:   CBC Recent Labs  Lab 03/19/18 0132  WBC 11.3*  HGB 13.6  HCT 42.5  PLT 197   ------------------------------------------------------------------------------------------------------------------  Chemistries  Recent Labs  Lab 03/19/18 0132  NA 136  K 4.4  CL 101  CO2 28  GLUCOSE 127*  BUN 18  CREATININE 0.98  CALCIUM 9.1  AST 20  ALT 15  ALKPHOS 72  BILITOT 0.7   ------------------------------------------------------------------------------------------------------------------  Cardiac Enzymes Recent Labs  Lab 03/19/18 0132  TROPONINI <0.03   ------------------------------------------------------------------------------------------------------------------  RADIOLOGY:  Dg Chest Port 1 View  Result Date: 03/19/2018 CLINICAL DATA:  Cough, weakness EXAM: PORTABLE CHEST 1 VIEW COMPARISON:  02/18/2018 FINDINGS: Left AICD remains in place, unchanged. Severe diffuse right lung airspace disease and pleural thickening again noted, unchanged. Interstitial prominence throughout the left lung. Findings are stable dating back to remote studies in early 2019. No definite acute process. IMPRESSION: Severe chronic changes with airspace disease and pleural thickening in the right hemithorax and interstitial prominence on the left. No definite  acute process. Electronically Signed   By: Rolm Baptise M.D.  On: 03/19/2018 02:03    EKG:   Orders placed or performed during the hospital encounter of 03/19/18  . EKG 12-Lead  . EKG 12-Lead  . EKG 12-Lead  . EKG 12-Lead    ASSESSMENT AND PLAN:    *Acute influenza A infection Tamiflu for 5-day course, supportive care, gentle IV fluids for rehydration given history of heart failure will watch for symptoms and signs of fluid overload Tylenol as needed fevers, antiemetics PRN  *Chronic A. Fib Stable Continue Eliquis and digoxin-check level in the morning  *History of congestive heart failure Without exacerbation Continue aspirin, on Eliquis, heart healthy/low-sodium diet, statin therapy  *Chronic cardiomyopathy Stable Status post AICD Conservative medical management  *COPD without exacerbation Stable Breathing treatments PRN On 2 L via nasal cannula chronically at home  *Chronic hypoxic respiratory failure Stable On 2 L chronically at home  *Chronic adult failure to thrive, deconditioning, malnutrition Increase nursing care PRN, aspiration/fall/skin care precautions while in house, dietary consulted  *History of hypotension Gentle hydration with IV fluids , monitor for symptoms and signs of fluid overload Continue Midodrine as needed     All the records are reviewed and case discussed with Care Management/Social Workerr. Management plans discussed with the patient, daughter-in-law at bedside and they are in agreement.  CODE STATUS: DNR  TOTAL TIME TAKING CARE OF THIS PATIENT: 43minutes.   POSSIBLE D/C IN 2  DAYS, DEPENDING ON CLINICAL CONDITION.  Note: This dictation was prepared with Dragon dictation along with smaller phrase technology. Any transcriptional errors that result from this process are unintentional.   Nicholes Mango M.D on 03/19/2018 at 12:50 PM  Between 7am to 6pm - Pager - [phone] After 6pm go to [email or web address] - password [email or web address]  [facility]  Office  [phone]  CC: Primary care physician; [name], MD

## 2018-03-19 NOTE — Progress Notes (Signed)
Please note, patient is currently followed by outpatient Palliative at home. [place] made aware.  Flo Shanks RN, BSN, [place] [facility], hospital Liaison [phone]

## 2018-03-19 NOTE — Significant Event (Signed)
Rapid Response Event Note  Overview: Time Called: 1710 Arrival Time: 1712 Event Type: Hypotension  Initial Focused Assessment: Rapid response RN arrived in patient's room and patient in trendelenberg position. Per patient's RN patient had been borderline hypotensive for most of the shift and had gotten a 250 mL bolus of NS at 13:00. When the NT went in to check vital signs patient's BP was 70s/20s and patient was pale. Patient placed in trendelenberg position then. When this RN arrived patient was alert and oriented, color pink and appropriate for ethnicity, skin dry. BP upon arrival was 102/63, MAP 76, HR 106 ST (per Brandi patient had been ST most of shift), oxygen saturations 98% on 2L [place], patient's RR 35 with slightly increased work of breathing (but patient's son at bedside said this was normal pattern for patient).  Interventions: None done by this RN. Sat patient up to lying flat and patient with no dizziness, shortness of breath, or light headedness. BP flat was 107/57, MAP 73, RR 28, oxygen saturations 97% on 2 L [place]. At 17:27 patient sat up slightly (patient did not want to sit up more) and BP was 95/62, MAP 73.   Plan of Care (if not transferred): Patient to remain on 1C for now. Brandi RN has been in contact with Dr. Margaretmary Eddy who she reports is aware of patient's situation. If patient's blood pressure unable to be maintained then RN and family instructed to recall rapid response  Event Summary:   Outcome: Stayed in room and stabalized  Event End Time: [place], [place]

## 2018-03-19 NOTE — Plan of Care (Signed)
BP running low. MD aware. Fluid bolus given and rate of IV fluid increased. Continuing to monitor.

## 2018-03-19 NOTE — ED Provider Notes (Signed)
[facility] Emergency Department Provider Note   ____________________________________________    I have reviewed the triage vital signs and the nursing notes.   HISTORY  Chief Complaint Nausea     HPI Russell Howard is a 84 y.o. male who presents with complaints of nausea, fatigue, and "I just do not feel well ".  Patient has a history of CHF, COPD, with defibrillator.  Daughter has arrived and reports that he had a fever yesterday, she notes that her son had a high fever earlier this week and flulike symptoms.  Patient reports his breathing is at baseline, he does wear 2 L of oxygen.  Reports nausea, mild cough.  Has not take anything for this.  Past Medical History:  Diagnosis Date  . Acquired hypothyroidism 11/02/2014  . BPH (benign prostatic hyperplasia)   . Cardiac defibrillator in place 11/02/2014  . Cardiomyopathy ([place])   . CHF (congestive heart failure) ([place])   . Chronic diastolic heart failure ([place]) 11/02/2014  . COPD (chronic obstructive pulmonary disease) ([place])   . Gastro-esophageal reflux disease without esophagitis 11/02/2014  . H/O malignant neoplasm 11/09/2014  . Heart valve disease 11/02/2014  . History of atrial fibrillation 11/02/2014  . Lung cancer ([place])   . Neuropathy 11/02/2014  . Orthostasis 11/02/2014  . Pure hypercholesterolemia 11/02/2014  . Valvular heart disease     Patient Active Problem List   Diagnosis Date Noted  . Influenza A 03/19/2018  . Acute UTI 10/17/2017  . Altered mental status 08/31/2017  . Advance care planning   . Adjustment disorder with mixed anxiety and depressed mood   . Grief   . Acute respiratory failure ([place]) 06/21/2017  . UTI (urinary tract infection) 11/17/2016  . Empyema ([place])   . Weakness   . Community acquired pneumonia   . Pleural effusion   . Palliative care by specialist   . Acute on chronic respiratory failure with hypoxia ([place]) 08/19/2016  . Acute respiratory failure with hypoxia ([place])  08/19/2016  . Septic shock ([place]) 03/02/2016  . Hydronephrosis, right 01/14/2016  . Constipation 01/13/2016  . Urinary retention 01/13/2016  . Lower abdominal pain 01/13/2016  . Bacteremia 01/13/2016  . Hypotension 01/13/2016  . Palliative care encounter   . Goals of care, counseling/discussion   . Cough   . Encounter for hospice care discussion   . Hypoxia 01/09/2016  . Primary cancer of bronchus of right lower lobe ([place]) 11/29/2015  . Sepsis ([place]) 06/14/2015  . Acute lower UTI 06/14/2015    Class: Acute  . H/O malignant neoplasm 11/09/2014  . Acquired hypothyroidism 11/02/2014  . Cardiomyopathy ([place]) 11/02/2014  . Acute on chronic diastolic CHF (congestive heart failure) ([place]) 11/02/2014  . Diabetes mellitus without complication ([place]) 26/83/4196  . Cardiac defibrillator in place 11/02/2014  . Gastro-esophageal reflux disease without esophagitis 11/02/2014  . History of atrial fibrillation 11/02/2014  . BP (high blood pressure) 11/02/2014  . Neuropathy 11/02/2014  . Orthostasis 11/02/2014  . Pure hypercholesterolemia 11/02/2014  . Heart valve disease 11/02/2014    Past Surgical History:  Procedure Laterality Date  . APPENDECTOMY    . CARDIAC VALVE REPLACEMENT     with a bovine valve  . DUAL ICD IMPLANT  2007/2009   implantable cardiac defibrillator  . PORT-A-CATH REMOVAL      Prior to Admission medications   Medication Sig Start Date End Date Taking? Authorizing Provider  acidophilus (RISAQUAD) CAPS capsule Take 1 capsule by mouth every evening.     [provider]  apixaban (ELIQUIS) 5 MG TABS tablet Take 1 tablet (5 mg total) by mouth 2 (two) times daily. 09/02/17   Gladstone Lighter, MD  Ascorbic Acid (VITAMIN C) 1000 MG tablet Take 500 mg by mouth 2 (two) times daily.     [provider]  aspirin EC 81 MG tablet Take 81 mg by mouth daily.     [provider]  Cholecalciferol 5000 units TABS Take 5,000 Units by mouth daily.    [provider]  Coenzyme Q10 (CO Q-10) 120 MG CAPS Take 120 mg by mouth every evening.    [provider]  digoxin (LANOXIN) 0.25 MG tablet Take 0.25 mg by mouth daily.     [provider]  gabapentin (NEURONTIN) 300 MG capsule Take 600 mg by mouth 2 (two) times daily.     [provider]  hydrocortisone (CORTEF) 10 MG tablet Take 10 mg by mouth 2 (two) times daily.     [provider]  levothyroxine (SYNTHROID, LEVOTHROID) 100 MCG tablet Take 100 mcg by mouth daily before breakfast.    [provider]  magnesium oxide (MAG-OX) 400 (241.3 Mg) MG tablet Take 400 mg by mouth every evening.    [provider]  methenamine (HIPREX) 1 g tablet TAKE 1 TABLET BY MOUTH TWICE DAILY WITH MEALS 11/20/17   McGowan, Larene [place] A, PA-C  midodrine (PROAMATINE) 10 MG tablet Take 1 tablet (10 mg total) by mouth 3 (three) times daily as needed. If systolic BP <035 0/09/38   Fritzi Mandes, MD  Multiple Vitamin (MULTIVITAMIN WITH MINERALS) TABS tablet Take 1 tablet by mouth daily.    [provider]  ondansetron (ZOFRAN) 4 MG tablet Take 4 mg by mouth every 8 (eight) hours as needed for nausea or vomiting.    [provider]  pantoprazole (PROTONIX) 40 MG tablet Take 40 mg by mouth daily before breakfast.     [provider]  pyridostigmine (MESTINON) 60 MG tablet Take 60 mg by mouth 2 (two) times daily. 01/01/18   [provider]  saw palmetto 500 MG capsule Take 1,000 mg by mouth every evening.    [provider]  simvastatin (ZOCOR) 20 MG tablet Take 20 mg by mouth every evening.     [provider]  vitamin B-12 (CYANOCOBALAMIN) 500 MCG tablet Take 500 mcg by mouth daily.     [provider]  vitamin E 400 UNIT capsule Take 400 Units by mouth every evening.     [provider]     Allergies Penicillin g and Sulfa antibiotics  Family History  Problem Relation Age of Onset  . Hypertension  Other   . Heart disease Other   . CAD Father     Social History Social History   Tobacco Use  . Smoking status: Former Smoker    Types: Cigarettes    Last attempt to quit: 11/21/1981    Years since quitting: 36.3  . Smokeless tobacco: Never Used  Substance Use Topics  . Alcohol use: No    Alcohol/week: 0.0 standard drinks  . Drug use: No    Review of Systems  Constitutional: Positive chills, positive fatigue Eyes: No visual changes.  ENT: No sore throat. Cardiovascular: Denies chest pain. Respiratory: Denies shortness of breath.  Positive cough Gastrointestinal: No abdominal pain.  No nausea, no vomiting.   Genitourinary: Negative for dysuria. Musculoskeletal: Some myalgias Skin: Negative for rash. Neurological: Negative for headaches   ____________________________________________   PHYSICAL  EXAM:  VITAL SIGNS: ED Triage Vitals  Enc Vitals Group     BP 03/19/18 0129 99/86     Pulse Rate 03/19/18 0129 (!) 56     Resp 03/19/18 0129 18     Temp 03/19/18 0129 98.1 F (36.7 C)     Temp Source 03/19/18 0129 Oral     SpO2 03/19/18 0129 94 %     Weight 03/19/18 0130 81.6 kg (180 lb)     Height 03/19/18 0130 1.778 m (5\' 10" )     Head Circumference --      Peak Flow --      Pain Score 03/19/18 0129 0     Pain Loc --      Pain Edu? --      Excl. in [place]? --     Constitutional: Alert and oriented.  Eyes: Conjunctivae are normal.   Nose: No congestion/rhinnorhea. Mouth/Throat: Mucous membranes are moist.    Cardiovascular: Normal rate, regular rhythm. Grossly normal heart sounds.  Good peripheral circulation. Respiratory: Normal respiratory effort.  No retractions.  Scattered wheezes Gastrointestinal: Soft and nontender. No distention.  Genitourinary: deferred Musculoskeletal:  Warm and well perfused Neurologic:  Normal speech and language. No gross focal neurologic deficits are appreciated.  Skin:  Skin is warm, dry and intact. No rash noted. Psychiatric:  Mood and affect are normal. Speech and behavior are normal.  ____________________________________________   LABS (all labs ordered are listed, but only abnormal results are displayed)  Labs Reviewed  INFLUENZA PANEL BY PCR (TYPE A & B) - Abnormal; Notable for the following components:      Result Value   Influenza A By PCR POSITIVE (*)    All other components within normal limits  CBC - Abnormal; Notable for the following components:   WBC 11.3 (*)    All other components within normal limits  COMPREHENSIVE METABOLIC PANEL - Abnormal; Notable for the following components:   Glucose, Bld 127 (*)    All other components within normal limits  TROPONIN I  URINALYSIS, COMPLETE (UACMP) WITH MICROSCOPIC   ____________________________________________  EKG  ED ECG REPORT I, Lavonia Drafts, the attending physician, personally viewed and interpreted this ECG.   Paced rhythm QRS Axis: normal Intervals: normal ST/T Wave abnormalities: Nonspecific Narrative Interpretation: no evidence of acute ischemia  ____________________________________________  RADIOLOGY  Chest x-ray negative for pneumonia, chronic changes ____________________________________________   PROCEDURES  Procedure(s) performed: No  Procedures   Critical Care performed: No ____________________________________________   INITIAL IMPRESSION / ASSESSMENT AND PLAN / ED COURSE  Pertinent labs & imaging results that were available during my care of the patient were reviewed by me and considered in my medical decision making (see chart for details).  Patient presents with weakness, nausea, reports of fever with sick contacts.  We will check labs, send influenza, check chest x-ray, urinalysis (patient does self cath).  Lab work significant for mildly elevated white blood cell count urinalysis overall unremarkable.  Influenza A positive will treat with Tamiflu  Patient is at high risk of complications of influenza  given chronic airspace disease will discuss with hospitalist for admission    ____________________________________________   FINAL CLINICAL IMPRESSION(S) / ED DIAGNOSES  Final diagnoses:  Influenza  Weakness  Nausea        Note:  This document was prepared using Dragon voice recognition software and may include unintentional dictation errors.   Lavonia Drafts, MD 03/19/18 [phone]

## 2018-03-19 NOTE — ED Triage Notes (Signed)
Pt arrives to ED via [facility] from home with c/o nausea and "not feeling well" x2 days. Per EMS, pt with c/o nausea and dry heaving, but no emesis. Family also reported a possible fever, but pt's temp by EMS was 98.6 orally.  Pt arrives on 2L [place] that he uses at home.

## 2018-03-19 NOTE — ED Notes (Signed)
Purple DNR identification bracelet placed on pt at this time.

## 2018-03-19 NOTE — Progress Notes (Signed)
Initial Nutrition Assessment  DOCUMENTATION CODES:   Not applicable  INTERVENTION:  -Ensure Enlive po BID, each supplement provides 350 kcal and 20 grams of protein -Magic cup TID with meals, each supplement provides 290 kcal and 9 grams of protein    NUTRITION DIAGNOSIS:   Inadequate oral intake related to acute illness, poor appetite as evidenced by energy intake < 75% for > 7 days.   GOAL:   Patient will meet greater than or equal to 90% of their needs  MONITOR:   PO intake, Weight trends, Supplement acceptance  REASON FOR ASSESSMENT:   Consult Assessment of nutrition requirement/status  ASSESSMENT:  84 year old male presented to ED via EMS with complaints of severe weakness, fever, fatigue admitted with influenza A. Pt with PMH including hypothyroidism, CHF, cardiac defibrillator, cardiac valve replacement, lung cancer   Pt placed on 2gm diet at admission, per RN note pt refused meals; MD liberated diet to regular. No recorded meals at this time.   Patient appears pale, groaning, and reports feeling poorly this morning. Untouched breakfast tray of eggs, bacon, and muffin on counter. Patient stated that he was feeling nauseous, endorsing poor appetite and PO for the past few days.   Patient resides with his son and daughter in law. He stated that his daughter in law prepares his meals and when feeling good he usually eats 3x/day. Patient unable to provide recall and told RD to ask Clarise Cruz what she cooks.  Patient weighed 162 lb in December and currently 179.5 lbs; no edema present. Patient unaware of any changes in weight and referred to [facility] for obtaining information and asked to be left alone.   Medications reviewed and include: acidophilus, vitD3, colace, Mucinex, MVI, protonix vit B12, vit C, vit E, zofran  D5 @ 50 mL/hr providing 204 kcal  No pertinent labs  NUTRITION - FOCUSED PHYSICAL EXAM:  Unable to complete full assessment d/t patient request to be left  alone. Suspect some mild/moderate nutrition from visual assessment.    Most Recent Value  Orbital Region  Moderate depletion  Upper Arm Region  Unable to assess  Thoracic and Lumbar Region  Unable to assess  Buccal Region  Moderate depletion  Temple Region  Mild depletion  Clavicle Bone Region  Mild depletion  Clavicle and Acromion Bone Region  Unable to assess  Scapular Bone Region  Unable to assess  Dorsal Hand  Unable to assess  Patellar Region  Unable to assess  Anterior Thigh Region  Unable to assess  Posterior Calf Region  Unable to assess  Edema (RD Assessment)  None  Hair  Reviewed  Eyes  Reviewed  Mouth  Reviewed  Skin  Reviewed  Nails  Reviewed [discoloration,   rt greater toe]       Diet Order:   Diet Order            Diet regular Room service appropriate? Yes; Fluid consistency: Thin  Diet effective now              EDUCATION NEEDS:   No education needs have been identified at this time  Skin:  Skin Assessment: Reviewed RN Assessment(abrasion; mid; anterior; head)  Last BM:  unknown  Height:   Ht Readings from Last 1 Encounters:  03/19/18 5\' 10"  (1.778 m)    Weight:   Wt Readings from Last 1 Encounters:  03/19/18 81.6 kg   02/18/18 68 kg  01/16/18 73.5 kg  12/29/17 77.1 kg  10/22/17 77.1 kg  10/21/17  77.1 kg  10/20/17 75.3 kg    Ideal Body Weight:  75.5 kg  BMI:  Body mass index is 25.83 kg/m.  Estimated Nutritional Needs:   Kcal:  1840-2000  Protein:  90-100 grams  Fluid:  2L/day    Lajuan Lines, RD, LDN  After Hours/Weekend Pager: [phone]

## 2018-03-19 NOTE — Progress Notes (Signed)
Family Meeting Note  Advance Directive:yes  Today a meeting took place with the Patient, daughter  Patient is able to participate   The following clinical team members were present during this meeting:MD  The following were discussed:Patient's diagnosis: Influenza A infection, Patient's progosis: Unable to determine and Goals for treatment: DNR  Additional follow-up to be provided: prn  Time spent during discussion:20 minutes  [name], MD

## 2018-03-19 NOTE — ED Notes (Addendum)
ED TO INPATIENT HANDOFF REPORT  ED Nurse Name and Phone #: Daiva Nakayama, RN [phone]  Name/Age/Gender Russell Howard 84 y.o. male Room/Bed: ED08A/ED08A  Code Status   Code Status: DNR  Home/SNF/Other Home Patient oriented to: A&Ox4 Is this baseline? Yes   Triage Complete: Triage complete  Chief Complaint emesis  Triage Note Pt arrives to ED via [facility] from home with c/o nausea and "not feeling well" x2 days. Per EMS, pt with c/o nausea and dry heaving, but no emesis. Family also reported a possible fever, but pt's temp by EMS was 98.6 orally.  Pt arrives on 2L [place] that he uses at home.   Allergies Allergies  Allergen Reactions  . Penicillin G Hives    Has patient had a PCN reaction causing immediate rash, facial/tongue/throat swelling, SOB or lightheadedness with hypotension: Yes Has patient had a PCN reaction causing severe rash involving mucus membranes or skin necrosis: No Has patient had a PCN reaction that required hospitalization No Has patient had a PCN reaction occurring within the last 10 years: No If all of the above answers are "NO", then may proceed with Cephalosporin use.  . Sulfa Antibiotics Rash    Level of Care/Admitting Diagnosis ED Disposition    ED Disposition Condition [facility] Area: [place] [100120]  Level of Care: Med-Surg [16]  Diagnosis: Influenza A [154008]  Admitting Physician: [name] [[id number]]  Attending Physician: [name] [[id number]]  PT Class (Do Not Modify): Observation [104]  PT Acc Code (Do Not Modify): Observation [10022]       Medical/Surgery History Past Medical History:  Diagnosis Date  . Acquired hypothyroidism 11/02/2014  . BPH (benign prostatic hyperplasia)   . Cardiac defibrillator in place 11/02/2014  . Cardiomyopathy ([place])   . CHF (congestive heart failure) ([place])   . Chronic diastolic heart failure ([place]) 11/02/2014  . COPD (chronic obstructive pulmonary disease) ([place])    . Gastro-esophageal reflux disease without esophagitis 11/02/2014  . H/O malignant neoplasm 11/09/2014  . Heart valve disease 11/02/2014  . History of atrial fibrillation 11/02/2014  . Lung cancer ([place])   . Neuropathy 11/02/2014  . Orthostasis 11/02/2014  . Pure hypercholesterolemia 11/02/2014  . Valvular heart disease    Past Surgical History:  Procedure Laterality Date  . APPENDECTOMY    . CARDIAC VALVE REPLACEMENT     with a bovine valve  . DUAL ICD IMPLANT  2007/2009   implantable cardiac defibrillator  . PORT-A-CATH REMOVAL       IV Location/Drains/Wounds Patient Lines/Drains/Airways Status   Active Line/Drains/Airways    Name:   Placement date:   Placement time:   Site:   Days:   Peripheral IV 03/19/18 Right;Distal Forearm   03/19/18    0140    Forearm   less than 1   Peripheral IV 03/19/18 Left Wrist   03/19/18    0141    Wrist   less than 1          Intake/Output Last 24 hours No intake or output data in the 24 hours ending 03/19/18 0422  Labs/Imaging Results for orders placed or performed during the hospital encounter of 03/19/18 (from the past 48 hour(s))  CBC     Status: Abnormal   Collection Time: 03/19/18  1:32 AM  Result Value Ref Range   WBC 11.3 (H) 4.0 - 10.5 K/uL   RBC 4.70 4.22 - 5.81 MIL/uL   Hemoglobin 13.6 13.0 - 17.0 g/dL   HCT  42.5 39.0 - 52.0 %   MCV 90.4 80.0 - 100.0 fL   MCH 28.9 26.0 - 34.0 pg   MCHC 32.0 30.0 - 36.0 g/dL   RDW 14.9 11.5 - 15.5 %   Platelets 197 150 - 400 K/uL   nRBC 0.0 0.0 - 0.2 %    Comment: Performed at [facility], [address] [place], [place] [address]  Comprehensive metabolic panel     Status: Abnormal   Collection Time: 03/19/18  1:32 AM  Result Value Ref Range   Sodium 136 135 - 145 mmol/L   Potassium 4.4 3.5 - 5.1 mmol/L   Chloride 101 98 - 111 mmol/L   CO2 28 22 - 32 mmol/L   Glucose, Bld 127 (H) 70 - 99 mg/dL   BUN 18 8 - 23 mg/dL   Creatinine, Ser 0.98 0.61 - 1.24 mg/dL   Calcium 9.1 8.9 -  10.3 mg/dL   Total Protein 7.5 6.5 - 8.1 g/dL   Albumin 3.8 3.5 - 5.0 g/dL   AST 20 15 - 41 U/L   ALT 15 0 - 44 U/L   Alkaline Phosphatase 72 38 - 126 U/L   Total Bilirubin 0.7 0.3 - 1.2 mg/dL   GFR calc non Af Amer >60 >60 mL/min   GFR calc Af Amer >60 >60 mL/min   Anion gap 7 5 - 15    Comment: Performed at [facility], [address] [place], [place] [address]  Troponin I - ONCE - STAT     Status: None   Collection Time: 03/19/18  1:32 AM  Result Value Ref Range   Troponin I <0.03 <0.03 ng/mL    Comment: Performed at [facility], [address] [place], [place] [address]  Influenza panel by PCR (type A & B)     Status: Abnormal   Collection Time: 03/19/18  1:49 AM  Result Value Ref Range   Influenza A By PCR POSITIVE (A) NEGATIVE   Influenza B By PCR NEGATIVE NEGATIVE    Comment: (NOTE) The Xpert Xpress Flu assay is intended as an aid in the diagnosis of  influenza and should not be used as a sole basis for treatment.  This  assay is FDA approved for nasopharyngeal swab specimens only. Nasal  washings and aspirates are unacceptable for Xpert Xpress Flu testing. Performed at [facility], [address] [place], [place] [address]    Dg Chest Port 1 View  Result Date: 03/19/2018 CLINICAL DATA:  Cough, weakness EXAM: PORTABLE CHEST 1 VIEW COMPARISON:  02/18/2018 FINDINGS: Left AICD remains in place, unchanged. Severe diffuse right lung airspace disease and pleural thickening again noted, unchanged. Interstitial prominence throughout the left lung. Findings are stable dating back to remote studies in early 2019. No definite acute process. IMPRESSION: Severe chronic changes with airspace disease and pleural thickening in the right hemithorax and interstitial prominence on the left. No definite acute process. Electronically Signed   By: Rolm Baptise M.D.   On: 03/19/2018 02:03    Pending Labs Unresulted Labs (From admission, onward)    Start      Ordered   03/19/18 0147  Urinalysis, Complete w Microscopic  Once,   STAT     03/19/18 0146          Vitals/Pain Today's Vitals   03/19/18 0250 03/19/18 0256 03/19/18 0310 03/19/18 0421  BP:  93/76 116/62 106/63  Pulse:  95 82 83  Resp:  (!) 21 20 (!)  22  Temp:  98.6 F (37 C)    TempSrc:  Oral    SpO2:  96% 98% 99%  Weight:      Height:      PainSc: 0-No pain       Isolation Precautions Droplet precaution  Medications Medications  guaiFENesin (MUCINEX) 12 hr tablet 600 mg (600 mg Oral Given 03/19/18 0417)  guaiFENesin-dextromethorphan (ROBITUSSIN DM) 100-10 MG/5ML syrup 5 mL (has no administration in time range)  budesonide (PULMICORT) nebulizer solution 0.5 mg (0.5 mg Nebulization Given 03/19/18 0418)  ondansetron (ZOFRAN) injection 4 mg (4 mg Intravenous Given 03/19/18 0205)  oseltamivir (TAMIFLU) capsule 75 mg (75 mg Oral Given 03/19/18 0253)    Mobility manual wheelchair Low fall risk   Focused Assessments    Recommendations: See Admitting Provider Note  Report given to: [place]  Additional Notes:  Pt self-caths PRN

## 2018-03-19 NOTE — Care Management Note (Addendum)
Case Management Note  Patient Details  Name: Russell Howard MRN: [id number] Date of Birth: [birth year]  Subjective/Objective:   Admitted to [facility] under observation status with the diagnosis of Flu+.  Lives with son and daughter-inlaw. [name] [phone]). Sees Dr. Doy Hutching as primary care physician. Prescriptions are filled at [facility] on [facility].  Home Health per [facility], [facility], and [facility] in the past. No skilled facility. Home oxygen per [facility].  Nebulizer, rolling walker, raised toilet seat, and flexible bed in the home. Self feed, needs help with dressing and baths.                  Action/Plan: Telephone call to Rema Jasmine at [facility]. Currently not seeing Mr. Haroon. Receiving skilled nursing and physical therapy in the past per [facility].    Expected Discharge Date:                  Expected Discharge Plan:     In-House Referral:   yes  Discharge planning Services   yes  Post Acute Care Choice:    Choice offered to:     DME Arranged:    DME Agency:     HH Arranged:    HH Agency:     Status of Service:     If discussed at [employer] of Stay Meetings, dates discussed:    Additional Comments:  Shelbie Ammons, RN MSN CCM Care Management [phone] 03/19/2018, 9:50 AM

## 2018-03-20 DIAGNOSIS — J101 Influenza due to other identified influenza virus with other respiratory manifestations: Secondary | ICD-10-CM | POA: Diagnosis not present

## 2018-03-20 LAB — DIGOXIN LEVEL: Digoxin Level: 1.4 ng/mL (ref 0.8–2.0)

## 2018-03-20 NOTE — Progress Notes (Signed)
[facility] [place] NAME: Russell Howard    MR#:  [id number]  DATE OF BIRTH:  Dec 16, 1934  SUBJECTIVE:  CHIEF COMPLAINT:   Patient is nauseous Had rapid called last pm due to BP 70's. Responded to iVF bolus Not vomiting.  The daughter-in-law at bedside REVIEW OF SYSTEMS:  CONSTITUTIONAL: No fever, fatigue . Feeling weak and tired EYES: No blurred or double vision.  EARS, NOSE, AND THROAT: No tinnitus or ear pain.  RESPIRATORY: Intermittent episodes of cough, shortness of breath, denies wheezing or hemoptysis.  CARDIOVASCULAR: No chest pain, orthopnea, edema.  GASTROINTESTINAL: Reports nausea, denies vomiting, diarrhea or abdominal pain.  GENITOURINARY: No dysuria, hematuria.  ENDOCRINE: No polyuria, nocturia,  HEMATOLOGY: No anemia, easy bruising or bleeding SKIN: No rash or lesion. MUSCULOSKELETAL: No joint pain or arthritis.   NEUROLOGIC: No tingling, numbness, weakness.  PSYCHIATRY: No anxiety or depression.   DRUG ALLERGIES:   Allergies  Allergen Reactions  . Penicillin G Hives    Has patient had a PCN reaction causing immediate rash, facial/tongue/throat swelling, SOB or lightheadedness with hypotension: Yes Has patient had a PCN reaction causing severe rash involving mucus membranes or skin necrosis: No Has patient had a PCN reaction that required hospitalization No Has patient had a PCN reaction occurring within the last 10 years: No If all of the above answers are "NO", then may proceed with Cephalosporin use.  . Sulfa Antibiotics Rash    VITALS:  Blood pressure 101/86, pulse 99, temperature 97.7 F (36.5 C), resp. rate 20, height 5\' 10"  (1.778 m), weight 68.7 kg, SpO2 94 %.  PHYSICAL EXAMINATION:  GENERAL:  84 y.o.-year-old patient lying in the bed with no acute distress.  EYES: Pupils equal, round, reactive to light and accommodation. No scleral icterus. Extraocular muscles intact.  HEENT: Head atraumatic,  normocephalic. Oropharynx and nasopharynx clear.  NECK:  Supple, no jugular venous distention. No thyroid enlargement, no tenderness.  LUNGS: Moderately diminished breath sounds bilaterally, no wheezing, rales,rhonchi or crepitation. No use of accessory muscles of respiration.  CARDIOVASCULAR: S1, S2 normal. No murmurs, rubs, or gallops.  ABDOMEN: Soft, nontender, nondistended. Bowel sounds present.  EXTREMITIES: No pedal edema, cyanosis, or clubbing.  NEUROLOGIC: Awake, alert and oriented x3. Sensation intact. Gait not checked. weak PSYCHIATRIC: The patient is alert and oriented x 3.  SKIN: No obvious rash, lesion, or ulcer.    LABORATORY PANEL:   CBC Recent Labs  Lab 03/19/18 0132  WBC 11.3*  HGB 13.6  HCT 42.5  PLT 197   ------------------------------------------------------------------------------------------------------------------  Chemistries  Recent Labs  Lab 03/19/18 0132  NA 136  K 4.4  CL 101  CO2 28  GLUCOSE 127*  BUN 18  CREATININE 0.98  CALCIUM 9.1  AST 20  ALT 15  ALKPHOS 72  BILITOT 0.7   ------------------------------------------------------------------------------------------------------------------  Cardiac Enzymes Recent Labs  Lab 03/19/18 0132  TROPONINI <0.03   ------------------------------------------------------------------------------------------------------------------  RADIOLOGY:  Dg Chest Port 1 View  Result Date: 03/19/2018 CLINICAL DATA:  Cough, weakness EXAM: PORTABLE CHEST 1 VIEW COMPARISON:  02/18/2018 FINDINGS: Left AICD remains in place, unchanged. Severe diffuse right lung airspace disease and pleural thickening again noted, unchanged. Interstitial prominence throughout the left lung. Findings are stable dating back to remote studies in early 2019. No definite acute process. IMPRESSION: Severe chronic changes with airspace disease and pleural thickening in the right hemithorax and interstitial prominence on the left. No  definite acute process. Electronically Signed   By: Lennette Bihari  [place] M.D.   On: 03/19/2018 02:03    EKG:   Orders placed or performed during the hospital encounter of 03/19/18  . EKG 12-Lead  . EKG 12-Lead  . EKG 12-Lead  . EKG 12-Lead    ASSESSMENT AND PLAN:    *Acute influenza A  Tamiflu for 5-day course, supportive care, gentle IV fluids for rehydration given history of heart failure will watch for symptoms and signs of fluid overload Tylenol as needed fevers, antiemetics PRN  *Chronic A. Fib Stable Continue Eliquis and digoxin level 1.9  *History of congestive heart failure Without exacerbation Continue aspirin, on Eliquis, heart healthy/low-sodium diet, statin therapy  *Chronic cardiomyopathy Stable Status post AICD Conservative medical management  *COPD without exacerbation Stable Breathing treatments PRN On 2 L via nasal cannula chronically at home  *Chronic hypoxic respiratory failure Stable On 2 L chronically at home  *Chronic adult failure to thrive, deconditioning, malnutrition Increase nursing care PRN, aspiration/fall/skin care precautions while in house, dietary consulted  *History of Chronic hypotension recieved hydration with IV fluids , monitor for symptoms and signs of fluid overload Continue Midodrine and hydrocortisone   * Nausea Prn zofran Ice chips. Encouraged po col fluids if able to take  * Gen weakness -PT to see pt At home uses walker  Dter in law has some questions regarding pt being observation vs inpt. At present he does not meet inpatient crieria. Josh, CM will stop by to address concerns.  If pt feels better later and wants to go home will facilitate that.  CODE STATUS: DNR  TOTAL TIME TAKING CARE OF THIS PATIENT: 72minutes.   POSSIBLE D/C todayDAYS, DEPENDING ON CLINICAL CONDITION.  Note: This dictation was prepared with Dragon dictation along with smaller phrase technology. Any transcriptional errors that result  from this process are unintentional.   Fritzi Mandes M.D on 03/20/2018 at 10:05 AM  Between 7am to 6pm - Pager - [phone] After 6pm go to [email or web address] - password [email or web address] [facility]  Office  [phone]  CC: Primary care physician; [name], MD

## 2018-03-20 NOTE — Care Management Note (Signed)
Case Management Note  Patient Details  Name: Russell Howard MRN: [id number] Date of Birth: 1934-08-13  Subjective/Objective:   RNCM met with patient and daughter to discuss disposition. Daughter has many concerns about discharge. We spoke at length and ultimately decided that home health would be beneficial. Since the patient has had COPD and CHF diagnosis in the past and has struggled with exacerbations we will utilize and agency with a COPD/CHf protocol. Form signed by MD and referral placed with Jermaine at [facility]. No DME needs, patient on chronic O2.                   Action/Plan:   Expected Discharge Date:                  Expected Discharge Plan:  [place]  In-House Referral:     Discharge planning Services  CM Consult  Post Acute Care Choice:  Home Health Choice offered to:  Patient, Adult Children  DME Arranged:    DME Agency:     HH Arranged:  RN, Disease Management [place] Agency:  [place]  Status of Service:  In process, will continue to follow  If discussed at Long Length of Stay Meetings, dates discussed:    Additional Comments:  Latanya Maudlin, RN 03/20/2018, 1:26 PM

## 2018-03-20 NOTE — Evaluation (Signed)
Physical Therapy Evaluation Patient Details Name: Russell Howard MRN: [id number] DOB: 1934-08-31 Today's Date: 03/20/2018   History of Present Illness  Patient is a pleasant 84 year old male who presented to the ER with severe generalized weakness, fatigue, fevers, nausea, dry heaves, cough, and emergency room work-up noted for white count 11,000, influenza A positive. Patient lives at son and daughter in laws and is followed by outpatient palliative care at home.  PMH includes COPD and CHF diagnosis.    Clinical Impression  Patient is a pleasant 84 year old male who presents with generalized weakness and instability. Was unable to assess transfers or ambulation due to patient experiencing drop in HR to 38 with extreme dizziness. Upon laying patient supine HR returned to normal and dizziness cleared. Stayed with patient bedside discussing options while monitoring vitals to ensure safe within functional levels maintained. Patient's son and patient refuse SNF placement stating that they would like to return home with home health. Patient would benefit from skilled physical therapy to increase LE strength, improve stability and mobility, and increase capacity for functional mobility for improved quality of life. Upon discharge patient would benefit from SNF placement due to dropping of vitals with transfers however due to family preference home health physical therapy and assistance would be required to address limitations in strength, mobility, and transfers.     Follow Up Recommendations SNF;Home health PT(Patient's family refuses SNF placement. )    Equipment Recommendations       Recommendations for Other Services       Precautions / Restrictions Precautions Precautions: Fall Precaution Comments: orthostatic hypotension requires HR monitoring for transfers.  Restrictions Weight Bearing Restrictions: No Other Position/Activity Restrictions: slow transitions required due to patient feeling dizzy        Mobility  Bed Mobility Overal bed mobility: Needs Assistance Bed Mobility: Supine to Sit;Sit to Supine     Supine to sit: Min guard;HOB elevated;Min assist(uses UE's to pull to sit. ) Sit to supine: Min guard;HOB elevated   General bed mobility comments: Patient requires use of UE's to pull to sit EOB, pulling with one hand on side of bed and other hand on PT's hand. No further assistance required.   Transfers                 General transfer comment: unable to stand due to extreme dizziness and drop in HR. Returned to supine where vitals returned to functional levels.   Ambulation/Gait             General Gait Details: unable to assess at this time.   Stairs            Wheelchair Mobility    Modified Rankin (Stroke Patients Only)       Balance Overall balance assessment: Needs assistance Sitting-balance support: Bilateral upper extremity supported;Feet supported Sitting balance-Leahy Scale: Fair Sitting balance - Comments: Initially fine sitting EOB however became dizzy with drop of HR requiring return to supine position.                                      Pertinent Vitals/Pain Pain Assessment: No/denies pain    Home Living Family/patient expects to be discharged to:: Private residence Living Arrangements: Children Available Help at Discharge: Family(daughter in law helps 24/7) Type of Home: House Home Access: Ramped entrance     Home Layout: One Reese: Environmental consultant - 2 wheels;Wheelchair -  manual;Other (comment)(bed is able to raise up and down) Additional Comments: daughter in law and son assist with all ADLs and iADLs.     Prior Function Level of Independence: Needs assistance   Gait / Transfers Assistance Needed: Patient's daughter in law assists with ADLs and iADLs. Patient utilizes RW to walk from bedroom to living room with daughter in law holding onto him. Uses a manual chair all other times. Is on home  oxygen at 2L via nasal cannula.   ADL's / Homemaking Assistance Needed: Requires assist for bathing and dressing from daughter in law, daughter in law provides set up for self-cath (pt does 2x/day) and manages medications, meals, cleaning  Comments: Pt chronically feels very fatigued/short of breath with minimal exertion. Does not walk much at baseline, primarily in bed or wheelchair.      Hand Dominance   Dominant Hand: Right    Extremity/Trunk Assessment   Upper Extremity Assessment Upper Extremity Assessment: Generalized weakness    Lower Extremity Assessment Lower Extremity Assessment: Generalized weakness(unable to assess seated due to HR and dizziness. In bed grossly is 3+/5 bilaterally )       Communication   Communication: No difficulties  Cognition Arousal/Alertness: Awake/alert Behavior During Therapy: WFL for tasks assessed/performed Overall Cognitive Status: Within Functional Limits for tasks assessed                                 General Comments: Patient alert and oriented. Follows complex requests.       General Comments General comments (skin integrity, edema, etc.): no noticeable skin tears/bruising. Limited muscle bulk on LE's.     Exercises Other Exercises Other Exercises: Supine: ankle pumps 10x, heel slides 10x bilaterally, abduction 10x,  Other Exercises: transition supine to sit  Other Exercises: education on slow transitions to decrease episodes of dizziness and drop of vitals.    Assessment/Plan    PT Assessment Patient needs continued PT services  PT Problem List Decreased strength;Decreased range of motion;Decreased activity tolerance;Decreased mobility;Decreased balance;Cardiopulmonary status limiting activity       PT Treatment Interventions Gait training;Therapeutic activities;Functional mobility training;Therapeutic exercise;Balance training;Neuromuscular re-education;Patient/family education;Wheelchair mobility  training;Manual techniques    PT Goals (Current goals can be found in the Care Plan section)  Acute Rehab PT Goals Patient Stated Goal: to return home PT Goal Formulation: With patient Time For Goal Achievement: 04/03/18 Potential to Achieve Goals: Fair    Frequency Min 2X/week   Barriers to discharge   will require assistance for transfers and medication management due to HR drop/dizziness with transitions.     Co-evaluation               AM-PAC PT "6 Clicks" Mobility  Outcome Measure Help needed turning from your back to your side while in a flat bed without using bedrails?: A Little Help needed moving from lying on your back to sitting on the side of a flat bed without using bedrails?: A Little Help needed moving to and from a bed to a chair (including a wheelchair)?: A Lot Help needed standing up from a chair using your arms (e.g., wheelchair or bedside chair)?: A Lot Help needed to walk in hospital room?: A Lot Help needed climbing 3-5 steps with a railing? : Total 6 Click Score: 13    End of Session Equipment Utilized During Treatment: Oxygen(2 L02 via nasal cannula ) Activity Tolerance: Treatment limited secondary to medical complications (Comment)(drop of  HR, dizziness. ) Patient left: in bed;with bed alarm set;with call bell/phone within reach;with family/visitor present Nurse Communication: Mobility status PT Visit Diagnosis: Unsteadiness on feet (R26.81);Other abnormalities of gait and mobility (R26.89);Muscle weakness (generalized) (M62.81);Adult, failure to thrive (R62.7);Dizziness and giddiness (R42)    Time: 9833-8250 PT Time Calculation (min) (ACUTE ONLY): 32 min   Charges:   PT Evaluation $PT Eval Low Complexity: 1 Low PT Treatments $Therapeutic Exercise: 8-22 mins        Janna Arch, PT, DPT    Janna Arch 03/20/2018, 4:05 PM

## 2018-03-21 DIAGNOSIS — J101 Influenza due to other identified influenza virus with other respiratory manifestations: Secondary | ICD-10-CM | POA: Diagnosis not present

## 2018-03-21 MED ORDER — OSELTAMIVIR PHOSPHATE 75 MG PO CAPS: 75.0000 mg | ORAL_CAPSULE | Freq: Two times a day (BID) | ORAL | 0 refills | Status: AC

## 2018-03-21 MED ORDER — ALBUTEROL SULFATE HFA 108 (90 BASE) MCG/ACT IN AERS: 2.0000 | INHALATION_SPRAY | Freq: Four times a day (QID) | RESPIRATORY_TRACT | 2 refills | Status: AC | PRN

## 2018-03-21 MED ORDER — ALBUTEROL SULFATE (2.5 MG/3ML) 0.083% IN NEBU
2.5000 mg | INHALATION_SOLUTION | Freq: Three times a day (TID) | RESPIRATORY_TRACT | Status: DC
  Administered 2018-03-21: 2.5 mg via RESPIRATORY_TRACT
  Filled 2018-03-21: qty 3

## 2018-03-21 MED ORDER — ENSURE ENLIVE PO LIQD: 237.0000 mL | Freq: Two times a day (BID) | ORAL | 12 refills | Status: DC

## 2018-03-21 NOTE — Care Management Note (Signed)
Case Management Note  Patient Details  Name: Russell Howard MRN: [id number] Date of Birth: [birth year]  Subjective/Objective:   Patient to be discharged per MD order. Orders in place for home health services. RNCM team had began workup for home health yesterday via [facility] since patient prefers to use COPD protocol. Referral was previously placed, notified Jermaine at [facility] of pending discharge. Chronic O2, tank brought from home. Family to transport.                  Action/Plan:   Expected Discharge Date:  03/21/18               Expected Discharge Plan:  [place]  In-House Referral:     Discharge planning Services  CM Consult  Post Acute Care Choice:  Home Health Choice offered to:  Patient, Adult Children  DME Arranged:    DME Agency:     HH Arranged:  RN, Disease Management, PT [place] Agency:  [place]  Status of Service:  Completed, signed off  If discussed at [place] of Stay Meetings, dates discussed:    Additional Comments:  Latanya Maudlin, RN 03/21/2018, 9:33 AM

## 2018-03-21 NOTE — Discharge Summary (Signed)
Russell Howard NAME: Russell Howard    MR#:  [id number]  DATE OF BIRTH:  [birth year]  DATE OF ADMISSION:  03/19/2018 ADMITTING PHYSICIAN: Russell Harms, MD  DATE OF DISCHARGE: 03/21/2018  PRIMARY CARE PHYSICIAN: Russell Crouch, MD    ADMISSION DIAGNOSIS:  Nausea [R11.0] Weakness [R53.1] Influenza [J11.1]  DISCHARGE DIAGNOSIS:  Influenza generalized weakness  SECONDARY DIAGNOSIS:   Past Medical History:  Diagnosis Date  . Acquired hypothyroidism 11/02/2014  . BPH (benign prostatic hyperplasia)   . Cardiac defibrillator in place 11/02/2014  . Cardiomyopathy ([place])   . CHF (congestive heart failure) ([place])   . Chronic diastolic heart failure ([place]) 11/02/2014  . COPD (chronic obstructive pulmonary disease) ([place])   . Gastro-esophageal reflux disease without esophagitis 11/02/2014  . H/O malignant neoplasm 11/09/2014  . Heart valve disease 11/02/2014  . History of atrial fibrillation 11/02/2014  . Lung cancer ([place])   . Neuropathy 11/02/2014  . Orthostasis 11/02/2014  . Pure hypercholesterolemia 11/02/2014  . Valvular heart disease     HOSPITAL COURSE:  Russell Howard  is a 84 y.o. male with a known history per below presenting from home via EMS with complaints of severe generalized weakness, fatigue, fevers, nausea, dry heaves, cough, and emergency room work-up noted for white count 11,000, influenza A positive.  * influenza A  Tamiflu for 5-day course, supportive care, gentle IV fluids for rehydration given history of heart failure will watch for symptoms and signs of fluid overload Tylenol as needed fevers, antiemetics PRN  *Chronic A. Fib Stable Continue Eliquis and digoxin level 1.4  *History of congestive heart failure Without exacerbation Continue aspirin, on Eliquis, heart healthy/low-sodium diet, statin therapy  *Chronic cardiomyopathy Status post AICD  *COPD without exacerbation--chronic Stable Breathing  treatments PRN On 2 L via nasal cannula chronically at home  *Chronic adult failure to thrive, deconditioning, malnutrition Increase nursing care PRN, aspiration/fall/skin care precautions while in house,   *History of Chronic hypotension recieved hydration with IV fluids , monitor for symptoms and signs of fluid overload Continue Midodrine and hydrocortisone Bp stable  * Nausea--now resolved. Ate better lat pm per RN Prn zofran  * Gen weakness -PT to see pt-- deconditioned. Pt declined SNF and would like to go home. Family aware. Will arrange Home health needs At home uses walker  D/c home today CONSULTS OBTAINED:    DRUG ALLERGIES:   Allergies  Allergen Reactions  . Penicillin G Hives    Has patient had a PCN reaction causing immediate rash, facial/tongue/throat swelling, SOB or lightheadedness with hypotension: Yes Has patient had a PCN reaction causing severe rash involving mucus membranes or skin necrosis: No Has patient had a PCN reaction that required hospitalization No Has patient had a PCN reaction occurring within the last 10 years: No If all of the above answers are "NO", then may proceed with Cephalosporin use.  . Sulfa Antibiotics Rash    DISCHARGE MEDICATIONS:   Allergies as of 03/21/2018      Reactions   Penicillin G Hives   Has patient had a PCN reaction causing immediate rash, facial/tongue/throat swelling, SOB or lightheadedness with hypotension: Yes Has patient had a PCN reaction causing severe rash involving mucus membranes or skin necrosis: No Has patient had a PCN reaction that required hospitalization No Has patient had a PCN reaction occurring within the last 10 years: No If all of the above answers are "NO", then may proceed with Cephalosporin use.  Sulfa Antibiotics Rash      Medication List    TAKE these medications   acidophilus Caps capsule Take 1 capsule by mouth every evening.   albuterol 108 (90 Base) MCG/ACT inhaler Commonly  known as:  PROVENTIL HFA;VENTOLIN HFA Inhale 2 puffs into the lungs every 6 (six) hours as needed for wheezing or shortness of breath.   apixaban 5 MG Tabs tablet Commonly known as:  ELIQUIS Take 1 tablet (5 mg total) by mouth 2 (two) times daily.   aspirin EC 81 MG tablet Take 81 mg by mouth daily.   Cholecalciferol 125 MCG (5000 UT) Tabs Take 5,000 Units by mouth daily.   Co Q-10 120 MG Caps Take 120 mg by mouth every evening.   digoxin 0.25 MG tablet Commonly known as:  LANOXIN Take 0.25 mg by mouth daily.   feeding supplement (ENSURE ENLIVE) Liqd Take 237 mLs by mouth 2 (two) times daily between meals.   gabapentin 300 MG capsule Commonly known as:  NEURONTIN Take 600 mg by mouth 2 (two) times daily.   hydrocortisone 10 MG tablet Commonly known as:  CORTEF Take 10 mg by mouth 2 (two) times daily.   levothyroxine 100 MCG tablet Commonly known as:  SYNTHROID, LEVOTHROID Take 100 mcg by mouth daily before breakfast.   magnesium oxide 400 (241.3 Mg) MG tablet Commonly known as:  MAG-OX Take 400 mg by mouth every evening.   methenamine 1 g tablet Commonly known as:  HIPREX TAKE 1 TABLET BY MOUTH TWICE DAILY WITH MEALS   midodrine 10 MG tablet Commonly known as:  PROAMATINE Take 1 tablet (10 mg total) by mouth 3 (three) times daily as needed. If systolic BP <742 What changed:  when to take this   multivitamin with minerals Tabs tablet Take 1 tablet by mouth daily.   ondansetron 4 MG tablet Commonly known as:  ZOFRAN Take 4 mg by mouth every 8 (eight) hours as needed for nausea or vomiting.   oseltamivir 75 MG capsule Commonly known as:  TAMIFLU Take 1 capsule (75 mg total) by mouth 2 (two) times daily for 2 days.   pantoprazole 40 MG tablet Commonly known as:  PROTONIX Take 40 mg by mouth daily before breakfast.   pyridostigmine 60 MG tablet Commonly known as:  MESTINON Take 60 mg by mouth 2 (two) times daily.   saw palmetto 500 MG capsule Take 1,000  mg by mouth every evening.   simvastatin 20 MG tablet Commonly known as:  ZOCOR Take 20 mg by mouth every evening.   vitamin B-12 500 MCG tablet Commonly known as:  CYANOCOBALAMIN Take 500 mcg by mouth daily.   vitamin C 1000 MG tablet Take 500 mg by mouth 2 (two) times daily.   vitamin E 400 UNIT capsule Take 400 Units by mouth every evening.       If you experience worsening of your admission symptoms, develop shortness of breath, life threatening emergency, suicidal or homicidal thoughts you must seek medical attention immediately by calling 911 or calling your MD immediately  if symptoms less severe.  You Must read complete instructions/literature along with all the possible adverse reactions/side effects for all the Medicines you take and that have been prescribed to you. Take any new Medicines after you have completely understood and accept all the possible adverse reactions/side effects.   Please note  You were cared for by a hospitalist during your hospital stay. If you have any questions about your discharge medications or the care  you received while you were in the hospital after you are discharged, you can call the unit and asked to speak with the hospitalist on call if the hospitalist that took care of you is not available. Once you are discharged, your primary care physician will handle any further medical issues. Please note that NO REFILLS for any discharge medications will be authorized once you are discharged, as it is imperative that you return to your primary care physician (or establish a relationship with a primary care physician if you do not have one) for your aftercare needs so that they can reassess your need for medications and monitor your lab values. Today   SUBJECTIVE   Resting quietly. No issues overnight per nighttime nurse. Patient 50% of his meal last evening according to the nurse. No vomiting. Patient denies any nausea.  No family in the room at  present  VITAL SIGNS:  Blood pressure 109/65, pulse 88, temperature (!) 97.4 F (36.3 C), temperature source Oral, resp. rate 20, height 5\' 10"  (1.778 m), weight 72.1 kg, SpO2 97 %.  I/O:    Intake/Output Summary (Last 24 hours) at 03/21/2018 0858 Last data filed at 03/21/2018 0013 Gross per 24 hour  Intake -  Output 2050 ml  Net -2050 ml    PHYSICAL EXAMINATION:  GENERAL:  84 y.o.-year-old patient lying in the bed with no acute distress. weak EYES: Pupils equal, round, reactive to light and accommodation. No scleral icterus. Extraocular muscles intact.  HEENT: Head atraumatic, normocephalic. Oropharynx and nasopharynx clear.  NECK:  Supple, no jugular venous distention. No thyroid enlargement, no tenderness.  LUNGS: distant breath sounds bilaterally, no wheezing, rales,rhonchi or crepitation. No use of accessory muscles of respiration.  CARDIOVASCULAR: S1, S2 normal. No murmurs, rubs, or gallops.  ABDOMEN: Soft, non-tender, non-distended. Bowel sounds present. No organomegaly or mass.  EXTREMITIES: No pedal edema, cyanosis, or clubbing.  NEUROLOGIC: Cranial nerves II through XII are intact. Muscle strength 5/5 in all extremities. Sensation intact. Gait not checked.  PSYCHIATRIC:  patient is alert and awake, answers all questions appropriately SKIN: No obvious rash, lesion, or ulcer.   DATA REVIEW:   CBC  Recent Labs  Lab 03/19/18 0132  WBC 11.3*  HGB 13.6  HCT 42.5  PLT 197    Chemistries  Recent Labs  Lab 03/19/18 0132  NA 136  K 4.4  CL 101  CO2 28  GLUCOSE 127*  BUN 18  CREATININE 0.98  CALCIUM 9.1  AST 20  ALT 15  ALKPHOS 72  BILITOT 0.7    Microbiology Results   Recent Results (from the past 240 hour(s))  MRSA PCR Screening     Status: Abnormal   Collection Time: 03/19/18  8:26 AM  Result Value Ref Range Status   MRSA by PCR POSITIVE (A) NEGATIVE Final    Comment:        The GeneXpert MRSA Assay (FDA approved for NASAL specimens only), is one  component of a comprehensive MRSA colonization surveillance program. It is not intended to diagnose MRSA infection nor to guide or monitor treatment for MRSA infections. RESULT CALLED TO, READ BACK BY AND VERIFIED WITH:  RANDI MILLER AT 2440 03/19/18 SMA/SDR Performed at [facility], [address] [place], [place] [address]     RADIOLOGY:  No results found.   CODE STATUS:     Code Status Orders  (From admission, onward)         Start     Ordered   03/19/18  0410  Do not attempt resuscitation (DNR)  Continuous    Question Answer Comment  In the event of cardiac or respiratory ARREST Do not call a "code blue"   In the event of cardiac or respiratory ARREST Do not perform Intubation, CPR, defibrillation or ACLS   In the event of cardiac or respiratory ARREST Use medication by any route, position, wound care, and other measures to relive pain and suffering. May use oxygen, suction and manual treatment of airway obstruction as needed for comfort.   Comments Nurse may pronounce      03/19/18 0409        Code Status History    Date Active Date Inactive Code Status Order ID Comments User Context   03/19/2018 0408 03/19/2018 0409 Full Code [id number]  Russell Harms, MD ED   01/15/2018 1909 01/18/2018 1510 DNR [id number]  Loletha Grayer, MD ED   10/17/2017 2259 10/20/2017 1812 DNR [id number]  Amelia Jo, MD Inpatient   08/31/2017 2104 09/02/2017 1540 DNR [id number]  Gladstone Lighter, MD Inpatient   06/21/2017 1420 06/24/2017 1512 DNR [id number]  Dustin Flock, MD Inpatient   05/22/2017 1609 05/29/2017 1740 DNR [id number]  Gladstone Lighter, MD Inpatient   11/17/2016 1631 11/20/2016 1602 DNR [id number]  Vaughan Basta, MD Inpatient   08/19/2016 1315 08/30/2016 1728 DNR [id number]  Harrie [place], MD Inpatient   03/02/2016 2236 03/05/2016 2020 DNR [id number]  Mikael Spray, NP ED   01/09/2016 0658 01/14/2016 1939 DNR [id number]  Harrie [place], MD Inpatient    11/09/2015 0318 11/12/2015 1713 DNR [id number]  Harrie [place], MD Inpatient   06/14/2015 1639 06/18/2015 1513 DNR [id number]  Demetrios Loll, MD Inpatient    Advance Directive Documentation     Most Recent Value  Type of Advance Directive  Healthcare Power of Attorney, Living will  Pre-existing out of facility DNR order (yellow form or pink MOST form)  Yellow form placed in chart (order not valid for inpatient use)  "MOST" Form in Place?  -      TOTAL TIME TAKING CARE OF THIS PATIENT: **40* minutes.    Fritzi Mandes M.D on 03/21/2018 at 8:58 AM  Between 7am to 6pm - Pager - [phone] After 6pm go to [email or web address] - password [email or web address] [facility]  Office  [phone]  CC: Primary care physician; Russell Crouch, MD

## 2018-03-21 NOTE — Progress Notes (Signed)
Pt d/c to home with daughter in law. VSS.  IVs removed intact. All belongings sent with pt. Education completed. All questions answered.

## 2018-03-25 ENCOUNTER — Emergency Department: Payer: Medicare Other

## 2018-03-25 ENCOUNTER — Encounter: Payer: Self-pay | Admitting: Emergency Medicine

## 2018-03-25 ENCOUNTER — Other Ambulatory Visit: Payer: Self-pay

## 2018-03-25 ENCOUNTER — Inpatient Hospital Stay
Admission: EM | Admit: 2018-03-25 | Discharge: 2018-03-28 | DRG: 445 | Disposition: A | Discharge status: Home / Self Care | Payer: Medicare Other | Attending: Internal Medicine | Admitting: Internal Medicine

## 2018-03-25 DIAGNOSIS — E039 Hypothyroidism, unspecified: Secondary | ICD-10-CM | POA: Diagnosis present

## 2018-03-25 DIAGNOSIS — Z6822 Body mass index (BMI) 22.0-22.9, adult: Secondary | ICD-10-CM

## 2018-03-25 DIAGNOSIS — Z7189 Other specified counseling: Secondary | ICD-10-CM | POA: Diagnosis not present

## 2018-03-25 DIAGNOSIS — I482 Chronic atrial fibrillation, unspecified: Secondary | ICD-10-CM | POA: Diagnosis present

## 2018-03-25 DIAGNOSIS — R1010 Upper abdominal pain, unspecified: Secondary | ICD-10-CM | POA: Diagnosis not present

## 2018-03-25 DIAGNOSIS — Z7901 Long term (current) use of anticoagulants: Secondary | ICD-10-CM | POA: Diagnosis not present

## 2018-03-25 DIAGNOSIS — Z515 Encounter for palliative care: Secondary | ICD-10-CM | POA: Diagnosis present

## 2018-03-25 DIAGNOSIS — K8010 Calculus of gallbladder with chronic cholecystitis without obstruction: Secondary | ICD-10-CM | POA: Diagnosis not present

## 2018-03-25 DIAGNOSIS — J449 Chronic obstructive pulmonary disease, unspecified: Secondary | ICD-10-CM | POA: Diagnosis present

## 2018-03-25 DIAGNOSIS — Z880 Allergy status to penicillin: Secondary | ICD-10-CM | POA: Diagnosis not present

## 2018-03-25 DIAGNOSIS — K8020 Calculus of gallbladder without cholecystitis without obstruction: Secondary | ICD-10-CM | POA: Diagnosis present

## 2018-03-25 DIAGNOSIS — Z79890 Hormone replacement therapy (postmenopausal): Secondary | ICD-10-CM

## 2018-03-25 DIAGNOSIS — E119 Type 2 diabetes mellitus without complications: Secondary | ICD-10-CM | POA: Diagnosis present

## 2018-03-25 DIAGNOSIS — E440 Moderate protein-calorie malnutrition: Secondary | ICD-10-CM | POA: Diagnosis present

## 2018-03-25 DIAGNOSIS — M623 Immobility syndrome (paraplegic): Secondary | ICD-10-CM | POA: Diagnosis present

## 2018-03-25 DIAGNOSIS — E7800 Pure hypercholesterolemia, unspecified: Secondary | ICD-10-CM | POA: Diagnosis present

## 2018-03-25 DIAGNOSIS — Z79899 Other long term (current) drug therapy: Secondary | ICD-10-CM | POA: Diagnosis not present

## 2018-03-25 DIAGNOSIS — Z882 Allergy status to sulfonamides status: Secondary | ICD-10-CM

## 2018-03-25 DIAGNOSIS — Z7982 Long term (current) use of aspirin: Secondary | ICD-10-CM

## 2018-03-25 DIAGNOSIS — Z87891 Personal history of nicotine dependence: Secondary | ICD-10-CM

## 2018-03-25 DIAGNOSIS — Z66 Do not resuscitate: Secondary | ICD-10-CM | POA: Diagnosis present

## 2018-03-25 DIAGNOSIS — Z85118 Personal history of other malignant neoplasm of bronchus and lung: Secondary | ICD-10-CM | POA: Diagnosis not present

## 2018-03-25 DIAGNOSIS — K219 Gastro-esophageal reflux disease without esophagitis: Secondary | ICD-10-CM | POA: Diagnosis present

## 2018-03-25 DIAGNOSIS — I5032 Chronic diastolic (congestive) heart failure: Secondary | ICD-10-CM | POA: Diagnosis present

## 2018-03-25 DIAGNOSIS — R627 Adult failure to thrive: Secondary | ICD-10-CM | POA: Diagnosis present

## 2018-03-25 DIAGNOSIS — Z95810 Presence of automatic (implantable) cardiac defibrillator: Secondary | ICD-10-CM | POA: Diagnosis not present

## 2018-03-25 DIAGNOSIS — Z8249 Family history of ischemic heart disease and other diseases of the circulatory system: Secondary | ICD-10-CM

## 2018-03-25 DIAGNOSIS — Z953 Presence of xenogenic heart valve: Secondary | ICD-10-CM

## 2018-03-25 DIAGNOSIS — N400 Enlarged prostate without lower urinary tract symptoms: Secondary | ICD-10-CM | POA: Diagnosis present

## 2018-03-25 LAB — URINALYSIS, COMPLETE (UACMP) WITH MICROSCOPIC
Bilirubin Urine: NEGATIVE
Glucose, UA: NEGATIVE mg/dL
Ketones, ur: 20 mg/dL — AB
Nitrite: NEGATIVE
Protein, ur: 30 mg/dL — AB
SPECIFIC GRAVITY, URINE: 1.017 (ref 1.005–1.030)
WBC, UA: >50 WBC/hpf — ABNORMAL HIGH (ref 0–5)
pH: 5 (ref 5.0–8.0)

## 2018-03-25 LAB — COMPREHENSIVE METABOLIC PANEL
ALT: 20 U/L (ref 0–44)
AST: 38 U/L (ref 15–41)
Albumin: 3.6 g/dL (ref 3.5–5.0)
Alkaline Phosphatase: 75 U/L (ref 38–126)
Anion gap: 11 (ref 5–15)
BILIRUBIN TOTAL: 1.6 mg/dL — AB (ref 0.3–1.2)
BUN: 15 mg/dL (ref 8–23)
CALCIUM: 8.8 mg/dL — AB (ref 8.9–10.3)
CO2: 23 mmol/L (ref 22–32)
Chloride: 97 mmol/L — ABNORMAL LOW (ref 98–111)
Creatinine, Ser: 0.95 mg/dL (ref 0.61–1.24)
GFR calc Af Amer: >60 mL/min (ref >60)
Glucose, Bld: 99 mg/dL (ref 70–99)
Potassium: 4.9 mmol/L (ref 3.5–5.1)
Sodium: 131 mmol/L — ABNORMAL LOW (ref 135–145)
Total Protein: 8.1 g/dL (ref 6.5–8.1)

## 2018-03-25 LAB — LIPASE, BLOOD: Lipase: 21 U/L (ref 11–51)

## 2018-03-25 LAB — CBC
HEMATOCRIT: 45.4 % (ref 39.0–52.0)
HEMOGLOBIN: 14.8 g/dL (ref 13.0–17.0)
MCH: 28.9 pg (ref 26.0–34.0)
MCHC: 32.6 g/dL (ref 30.0–36.0)
MCV: 88.7 fL (ref 80.0–100.0)
Platelets: 180 10*3/uL (ref 150–400)
RBC: 5.12 MIL/uL (ref 4.22–5.81)
RDW: 14.6 % (ref 11.5–15.5)
WBC: 10.2 10*3/uL (ref 4.0–10.5)
nRBC: 0 % (ref 0.0–0.2)

## 2018-03-25 LAB — TROPONIN I: Troponin I: <0.03 ng/mL (ref <0.03)

## 2018-03-25 LAB — MRSA PCR SCREENING: MRSA by PCR: POSITIVE — AB

## 2018-03-25 MED ORDER — SENNOSIDES-DOCUSATE SODIUM 8.6-50 MG PO TABS: 1.0000 | ORAL_TABLET | Freq: Every evening | ORAL | Status: DC | PRN

## 2018-03-25 MED ORDER — ALBUTEROL SULFATE (2.5 MG/3ML) 0.083% IN NEBU: 2.5000 mg | INHALATION_SOLUTION | Freq: Four times a day (QID) | RESPIRATORY_TRACT | Status: DC | PRN

## 2018-03-25 MED ORDER — ONDANSETRON HCL 4 MG/2ML IJ SOLN
4.0000 mg | Freq: Once | INTRAMUSCULAR | Status: AC
  Administered 2018-03-25: 4 mg via INTRAVENOUS
  Filled 2018-03-25: qty 2

## 2018-03-25 MED ORDER — PYRIDOSTIGMINE BROMIDE 60 MG PO TABS
60.0000 mg | ORAL_TABLET | Freq: Two times a day (BID) | ORAL | Status: DC
  Administered 2018-03-25 – 2018-03-28 (×5): 60 mg via ORAL
  Filled 2018-03-25 (×7): qty 1

## 2018-03-25 MED ORDER — DIGOXIN 250 MCG PO TABS
0.2500 mg | ORAL_TABLET | Freq: Every day | ORAL | Status: DC
  Administered 2018-03-25 – 2018-03-28 (×4): 0.25 mg via ORAL
  Filled 2018-03-25 (×5): qty 1

## 2018-03-25 MED ORDER — ONDANSETRON HCL 4 MG/2ML IJ SOLN
4.0000 mg | Freq: Four times a day (QID) | INTRAMUSCULAR | Status: DC | PRN
  Administered 2018-03-25 – 2018-03-26 (×3): 4 mg via INTRAVENOUS
  Filled 2018-03-25 (×3): qty 2

## 2018-03-25 MED ORDER — ACETAMINOPHEN 650 MG RE SUPP: 650.0000 mg | Freq: Four times a day (QID) | RECTAL | Status: DC | PRN

## 2018-03-25 MED ORDER — MORPHINE SULFATE (PF) 4 MG/ML IV SOLN
4.0000 mg | Freq: Once | INTRAVENOUS | Status: AC
  Administered 2018-03-25: 4 mg via INTRAVENOUS
  Filled 2018-03-25: qty 1

## 2018-03-25 MED ORDER — SODIUM CHLORIDE 0.9 % IV SOLN
INTRAVENOUS | Status: DC
  Administered 2018-03-25 – 2018-03-28 (×3): via INTRAVENOUS

## 2018-03-25 MED ORDER — ONDANSETRON HCL 4 MG PO TABS: 4.0000 mg | ORAL_TABLET | Freq: Four times a day (QID) | ORAL | Status: DC | PRN

## 2018-03-25 MED ORDER — MORPHINE SULFATE (PF) 2 MG/ML IV SOLN
2.0000 mg | INTRAVENOUS | Status: DC | PRN
  Administered 2018-03-25 – 2018-03-26 (×2): 2 mg via INTRAVENOUS
  Filled 2018-03-25 (×2): qty 1

## 2018-03-25 MED ORDER — ACETAMINOPHEN 325 MG PO TABS: 650.0000 mg | ORAL_TABLET | Freq: Four times a day (QID) | ORAL | Status: DC | PRN

## 2018-03-25 MED ORDER — LEVOTHYROXINE SODIUM 100 MCG PO TABS
100.0000 ug | ORAL_TABLET | Freq: Every day | ORAL | Status: DC
  Administered 2018-03-26 – 2018-03-28 (×3): 100 ug via ORAL
  Filled 2018-03-25 (×3): qty 1

## 2018-03-25 NOTE — ED Triage Notes (Signed)
Pt to ER via EMS with c/o RUQ abdominal pain starting last night.  Pt with nausea and spitting up phlem.  Pt is hospice pt, but they were not notified.  Pt seen in last 2 weeks for flu.

## 2018-03-25 NOTE — ED Notes (Signed)
Attempt IV x 2 without success.  Dr. Corky Downs made aware.

## 2018-03-25 NOTE — ED Notes (Signed)
ED TO INPATIENT HANDOFF REPORT  ED Nurse Name and Phone #: Anderson [place] 10  S Name/Age/Gender Russell Howard 84 y.o. male Room/Bed: ED13A/ED13A  Code Status   Code Status: Prior  Home/SNF/Other Home Patient oriented to: self, place, time and situation Is this baseline? Yes   Triage Complete: Triage complete  Chief Complaint abd pain  Triage Note Pt to ER via EMS with c/o RUQ abdominal pain starting last night.  Pt with nausea and spitting up phlem.  Pt is hospice pt, but they were not notified.  Pt seen in last 2 weeks for flu.   Allergies Allergies  Allergen Reactions  . Penicillin G Hives    Has patient had a PCN reaction causing immediate rash, facial/tongue/throat swelling, SOB or lightheadedness with hypotension: Yes Has patient had a PCN reaction causing severe rash involving mucus membranes or skin necrosis: No Has patient had a PCN reaction that required hospitalization No Has patient had a PCN reaction occurring within the last 10 years: No If all of the above answers are "NO", then may proceed with Cephalosporin use.  . Sulfa Antibiotics Rash    Level of Care/Admitting Diagnosis ED Disposition    ED Disposition Condition [facility] Area: [place] [[id number]]  Level of Care: Med-Surg [16]  Diagnosis: Cholelithiasis [630160]  Admitting Physician: Saundra Shelling [[id number]]  Attending Physician: Saundra Shelling [[id number]]  Estimated length of stay: past midnight tomorrow  Certification:: I certify this patient will need inpatient services for at least 2 midnights  PT Class (Do Not Modify): Inpatient [101]  PT Acc Code (Do Not Modify): Private [1]       B Medical/Surgery History Past Medical History:  Diagnosis Date  . Acquired hypothyroidism 11/02/2014  . BPH (benign prostatic hyperplasia)   . Cardiac defibrillator in place 11/02/2014  . Cardiomyopathy ([place])   . CHF (congestive heart failure) ([place])   . Chronic diastolic  heart failure ([place]) 11/02/2014  . COPD (chronic obstructive pulmonary disease) ([place])   . Gastro-esophageal reflux disease without esophagitis 11/02/2014  . H/O malignant neoplasm 11/09/2014  . Heart valve disease 11/02/2014  . History of atrial fibrillation 11/02/2014  . Lung cancer ([place])   . Neuropathy 11/02/2014  . Orthostasis 11/02/2014  . Pure hypercholesterolemia 11/02/2014  . Valvular heart disease    Past Surgical History:  Procedure Laterality Date  . APPENDECTOMY    . CARDIAC VALVE REPLACEMENT     with a bovine valve  . DUAL ICD IMPLANT  2007/2009   implantable cardiac defibrillator  . PORT-A-CATH REMOVAL       A IV Location/Drains/Wounds Patient Lines/Drains/Airways Status   Active Line/Drains/Airways    Name:   Placement date:   Placement time:   Site:   Days:   Peripheral IV 03/25/18 Left Hand   03/25/18    1125    Hand   less than 1          Intake/Output Last 24 hours No intake or output data in the 24 hours ending 03/25/18 1738  Labs/Imaging Results for orders placed or performed during the hospital encounter of 03/25/18 (from the past 48 hour(s))  CBC     Status: None   Collection Time: 03/25/18 11:00 AM  Result Value Ref Range   WBC 10.2 4.0 - 10.5 K/uL   RBC 5.12 4.22 - 5.81 MIL/uL   Hemoglobin 14.8 13.0 - 17.0 g/dL   HCT 45.4 39.0 - 52.0 %   MCV 88.7 80.0 - 100.0  fL   MCH 28.9 26.0 - 34.0 pg   MCHC 32.6 30.0 - 36.0 g/dL   RDW 14.6 11.5 - 15.5 %   Platelets 180 150 - 400 K/uL   nRBC 0.0 0.0 - 0.2 %    Comment: Performed at [facility], [address] [place], [place] [address]  Comprehensive metabolic panel     Status: Abnormal   Collection Time: 03/25/18 11:00 AM  Result Value Ref Range   Sodium 131 (L) 135 - 145 mmol/L   Potassium 4.9 3.5 - 5.1 mmol/L    Comment: HEMOLYSIS AT THIS LEVEL MAY AFFECT RESULT   Chloride 97 (L) 98 - 111 mmol/L   CO2 23 22 - 32 mmol/L   Glucose, Bld 99 70 - 99 mg/dL   BUN 15 8 - 23 mg/dL   Creatinine, Ser  0.95 0.61 - 1.24 mg/dL   Calcium 8.8 (L) 8.9 - 10.3 mg/dL   Total Protein 8.1 6.5 - 8.1 g/dL   Albumin 3.6 3.5 - 5.0 g/dL   AST 38 15 - 41 U/L    Comment: HEMOLYSIS AT THIS LEVEL MAY AFFECT RESULT   ALT 20 0 - 44 U/L   Alkaline Phosphatase 75 38 - 126 U/L   Total Bilirubin 1.6 (H) 0.3 - 1.2 mg/dL    Comment: HEMOLYSIS AT THIS LEVEL MAY AFFECT RESULT   GFR calc non Af Amer >60 >60 mL/min   GFR calc Af Amer >60 >60 mL/min   Anion gap 11 5 - 15    Comment: Performed at [facility], [address] [place], [place] [address]  Troponin I - ONCE - STAT     Status: None   Collection Time: 03/25/18 11:00 AM  Result Value Ref Range   Troponin I <0.03 <0.03 ng/mL    Comment: Performed at [facility], [address] [place], [place] [address]  Lipase, blood     Status: None   Collection Time: 03/25/18 11:00 AM  Result Value Ref Range   Lipase 21 11 - 51 U/L    Comment: Performed at [facility], [address] [place], [place] [address]  Urinalysis, Complete w Microscopic     Status: Abnormal   Collection Time: 03/25/18 11:27 AM  Result Value Ref Range   Color, Urine AMBER (A) YELLOW    Comment: BIOCHEMICALS MAY BE AFFECTED BY COLOR   APPearance HAZY (A) CLEAR   Specific Gravity, Urine 1.017 1.005 - 1.030   pH 5.0 5.0 - 8.0   Glucose, UA NEGATIVE NEGATIVE mg/dL   Hgb urine dipstick MODERATE (A) NEGATIVE   Bilirubin Urine NEGATIVE NEGATIVE   Ketones, ur 20 (A) NEGATIVE mg/dL   Protein, ur 30 (A) NEGATIVE mg/dL   Nitrite NEGATIVE NEGATIVE   Leukocytes,Ua MODERATE (A) NEGATIVE   RBC / HPF 11-20 0 - 5 RBC/hpf   WBC, UA >50 (H) 0 - 5 WBC/hpf   Bacteria, UA RARE (A) NONE SEEN   Squamous Epithelial / LPF 0-5 0 - 5   WBC Clumps PRESENT    Mucus PRESENT     Comment: Performed at [facility], [address], [place], [place] [address]   Dg Chest Portable 1 View  Result Date: 03/25/2018 CLINICAL DATA:  Chest discomfort, COPD. CHF. Reported right  lung cancer. EXAM: PORTABLE CHEST 1 VIEW COMPARISON:  03/19/2018 chest radiograph. FINDINGS: Stable configuration of 3 lead left subclavian ICD and cardiac valvular prosthesis. Stable cardiomediastinal silhouette with mild cardiomegaly. No pneumothorax. Stable  volume loss in the right hemithorax. Stable diffuse right pleural thickening. Stable chronic coarse reticular opacities throughout the right greater than left lungs. No superimposed acute consolidative airspace disease. IMPRESSION: Stable chest with no acute superimposed process. Chronic fibrotic changes in the right greater than left lungs. Electronically Signed   By: Ilona Sorrel M.D.   On: 03/25/2018 10:55   US Abdomen Limited Ruq  Result Date: 03/25/2018 CLINICAL DATA:  Upper abdominal pain.  Nausea for few days. EXAM: ULTRASOUND ABDOMEN LIMITED RIGHT UPPER QUADRANT COMPARISON:  Body CT 05/04/2017 FINDINGS: Gallbladder: Borderline gallbladder wall thickening, measuring up to 3.3 mm. 13 mm gallbladder stone within the gallbladder neck. No sonographic Murphy sign noted by sonographer. Common bile duct: Diameter: 0.3 mm Liver: No focal lesion identified. Within normal limits in parenchymal echogenicity. Portal vein is patent on color Doppler imaging with normal direction of blood flow towards the liver. IMPRESSION: Cholelithiasis with borderline gallbladder wall thickening. This, in the appropriate clinical settings, may represent early acute cholecystitis. If further imaging evaluation is desired, HIDA scan may be considered. Electronically Signed   By: Fidela Salisbury M.D.   On: 03/25/2018 12:27    Pending Labs [employer] (From admission, onward)    Start     Ordered   Signed and Held  CBC  (enoxaparin (LOVENOX)    CrCl >/= 30 ml/min)  Once,   R    Comments:  Baseline for enoxaparin therapy IF NOT ALREADY DRAWN.  Notify MD if PLT < 100 K.    Signed and Held   Signed and Held  Creatinine, serum  (enoxaparin (LOVENOX)    CrCl >/= 30  ml/min)  Once,   R    Comments:  Baseline for enoxaparin therapy IF NOT ALREADY DRAWN.    Signed and Held   Signed and Held  Creatinine, serum  (enoxaparin (LOVENOX)    CrCl >/= 30 ml/min)  Weekly,   R    Comments:  while on enoxaparin therapy    Signed and Held   Signed and Held  Comprehensive metabolic panel  Tomorrow morning,   R     Signed and Held   Signed and Held  CBC  Tomorrow morning,   R     Signed and Held          Vitals/Pain Today's Vitals   03/25/18 1430 03/25/18 1500 03/25/18 1530 03/25/18 1600  BP: 103/60 105/67 101/82 105/65  Pulse: 65 70 70 70  Resp: (!) 27 (!) 25 (!) 30 (!) 24  SpO2: 99% 99% 99% 96%  Weight:      Height:      PainSc:        Isolation Precautions No active isolations  Medications Medications  morphine 2 MG/ML injection 2 mg (has no administration in time range)  morphine 4 MG/ML injection 4 mg (4 mg Intravenous Given 03/25/18 1135)  ondansetron (ZOFRAN) injection 4 mg (4 mg Intravenous Given 03/25/18 1133)    Mobility non-ambulatory Low fall risk   Focused Assessments    R Recommendations: See Admitting Provider Note  Report given to:   Additional Notes:

## 2018-03-25 NOTE — ED Provider Notes (Signed)
[facility] Emergency Department Provider Note   ____________________________________________    I have reviewed the triage vital signs and the nursing notes.   HISTORY  Chief Complaint Abdominal pain    HPI Russell Howard is a 84 y.o. male who presents with complaints of abdominal pain.  Patient with a history of diabetes, CHF, COPD, with defibrillator presents with complaints of right upper quadrant abdominal pain, nausea.  History is primarily per EMS.  Apparently this pain started overnight, no history of cholecystectomy.  Positive nausea, seems to be spitting up versus coughing up mucus.  No reports of fevers.  Recently admitted to the hospital for influenza  Past Medical History:  Diagnosis Date  . Acquired hypothyroidism 11/02/2014  . BPH (benign prostatic hyperplasia)   . Cardiac defibrillator in place 11/02/2014  . Cardiomyopathy ([place])   . CHF (congestive heart failure) ([place])   . Chronic diastolic heart failure ([place]) 11/02/2014  . COPD (chronic obstructive pulmonary disease) ([place])   . Gastro-esophageal reflux disease without esophagitis 11/02/2014  . H/O malignant neoplasm 11/09/2014  . Heart valve disease 11/02/2014  . History of atrial fibrillation 11/02/2014  . Lung cancer ([place])   . Neuropathy 11/02/2014  . Orthostasis 11/02/2014  . Pure hypercholesterolemia 11/02/2014  . Valvular heart disease     Patient Active Problem List   Diagnosis Date Noted  . Influenza A 03/19/2018  . Acute UTI 10/17/2017  . Altered mental status 08/31/2017  . Advance care planning   . Adjustment disorder with mixed anxiety and depressed mood   . Grief   . Acute respiratory failure ([place]) 06/21/2017  . UTI (urinary tract infection) 11/17/2016  . Empyema ([place])   . Weakness   . Community acquired pneumonia   . Pleural effusion   . Palliative care by specialist   . Acute on chronic respiratory failure with hypoxia ([place]) 08/19/2016  . Acute respiratory failure  with hypoxia ([place]) 08/19/2016  . Septic shock ([place]) 03/02/2016  . Hydronephrosis, right 01/14/2016  . Constipation 01/13/2016  . Urinary retention 01/13/2016  . Lower abdominal pain 01/13/2016  . Bacteremia 01/13/2016  . Hypotension 01/13/2016  . Palliative care encounter   . Goals of care, counseling/discussion   . Cough   . Encounter for hospice care discussion   . Hypoxia 01/09/2016  . Primary cancer of bronchus of right lower lobe ([place]) 11/29/2015  . Sepsis ([place]) 06/14/2015  . Acute lower UTI 06/14/2015    Class: Acute  . H/O malignant neoplasm 11/09/2014  . Acquired hypothyroidism 11/02/2014  . Cardiomyopathy ([place]) 11/02/2014  . Acute on chronic diastolic CHF (congestive heart failure) ([place]) 11/02/2014  . Diabetes mellitus without complication ([place]) 28/31/5176  . Cardiac defibrillator in place 11/02/2014  . Gastro-esophageal reflux disease without esophagitis 11/02/2014  . History of atrial fibrillation 11/02/2014  . BP (high blood pressure) 11/02/2014  . Neuropathy 11/02/2014  . Orthostasis 11/02/2014  . Pure hypercholesterolemia 11/02/2014  . Heart valve disease 11/02/2014    Past Surgical History:  Procedure Laterality Date  . APPENDECTOMY    . CARDIAC VALVE REPLACEMENT     with a bovine valve  . DUAL ICD IMPLANT  2007/2009   implantable cardiac defibrillator  . PORT-A-CATH REMOVAL      Prior to Admission medications   Medication Sig Start Date End Date Taking? Authorizing Provider  acidophilus (RISAQUAD) CAPS capsule Take 1 capsule by mouth every evening.     [provider]  albuterol (PROVENTIL HFA;VENTOLIN HFA) 108 (90 Base)  MCG/ACT inhaler Inhale 2 puffs into the lungs every 6 (six) hours as needed for wheezing or shortness of breath. 03/21/18   Fritzi Mandes, MD  apixaban (ELIQUIS) 5 MG TABS tablet Take 1 tablet (5 mg total) by mouth 2 (two) times daily. 09/02/17   Gladstone Lighter, MD  Ascorbic Acid (VITAMIN C) 1000 MG tablet Take 500 mg by mouth 2  (two) times daily.     [provider]  aspirin EC 81 MG tablet Take 81 mg by mouth daily.     [provider]  Cholecalciferol 5000 units TABS Take 5,000 Units by mouth daily.    [provider]  Coenzyme Q10 (CO Q-10) 120 MG CAPS Take 120 mg by mouth every evening.    [provider]  digoxin (LANOXIN) 0.25 MG tablet Take 0.25 mg by mouth daily.     [provider]  feeding supplement, ENSURE ENLIVE, (ENSURE ENLIVE) LIQD Take 237 mLs by mouth 2 (two) times daily between meals. 03/21/18   Fritzi Mandes, MD  gabapentin (NEURONTIN) 300 MG capsule Take 600 mg by mouth 2 (two) times daily.     [provider]  hydrocortisone (CORTEF) 10 MG tablet Take 10 mg by mouth 2 (two) times daily.     [provider]  levothyroxine (SYNTHROID, LEVOTHROID) 100 MCG tablet Take 100 mcg by mouth daily before breakfast.    [provider]  magnesium oxide (MAG-OX) 400 (241.3 Mg) MG tablet Take 400 mg by mouth every evening.    [provider]  methenamine (HIPREX) 1 g tablet TAKE 1 TABLET BY MOUTH TWICE DAILY WITH MEALS 11/20/17   McGowan, Larene [place] A, PA-C  midodrine (PROAMATINE) 10 MG tablet Take 1 tablet (10 mg total) by mouth 3 (three) times daily as needed. If systolic BP <867 Patient taking differently: Take 10 mg by mouth 2 (two) times daily at 10 AM and 5 PM. If systolic BP <672 0/94/70   Fritzi Mandes, MD  Multiple Vitamin (MULTIVITAMIN WITH MINERALS) TABS tablet Take 1 tablet by mouth daily.    [provider]  ondansetron (ZOFRAN) 4 MG tablet Take 4 mg by mouth every 8 (eight) hours as needed for nausea or vomiting.    [provider]  pantoprazole (PROTONIX) 40 MG tablet Take 40 mg by mouth daily before breakfast.     [provider]  pyridostigmine (MESTINON) 60 MG tablet Take 60 mg by mouth 2 (two) times daily. 01/01/18   [provider]  saw palmetto 500 MG capsule Take 1,000 mg by mouth every  evening.    [provider]  simvastatin (ZOCOR) 20 MG tablet Take 20 mg by mouth every evening.     [provider]  vitamin B-12 (CYANOCOBALAMIN) 500 MCG tablet Take 500 mcg by mouth daily.     [provider]  vitamin E 400 UNIT capsule Take 400 Units by mouth every evening.     [provider]     Allergies Penicillin g and Sulfa antibiotics  Family History  Problem Relation Age of Onset  . Hypertension Other   . Heart disease Other   . CAD Father     Social History Social History   Tobacco Use  . Smoking status: Former Smoker    Types: Cigarettes    Last attempt to quit: 11/21/1981    Years since quitting: 36.3  . Smokeless tobacco: Never Used  Substance Use Topics  . Alcohol use: No    Alcohol/week: 0.0 standard  drinks  . Drug use: No    Review of Systems  Constitutional: No reports of fevers Eyes: No discharge ENT: No throat swelling Cardiovascular: Denies chest pain. Respiratory: Denies shortness of breath. Gastrointestinal: As above Genitourinary: Negative for dysuria. Musculoskeletal: Negative for back pain. Skin: Negative for rash. Neurological: Negative for headaches   ____________________________________________   PHYSICAL EXAM:  VITAL SIGNS: ED Triage Vitals  Enc Vitals Group     BP 03/25/18 1029 (!) 106/54     Pulse Rate 03/25/18 1029 77     Resp 03/25/18 1029 (!) 33     Temp --      Temp src --      SpO2 03/25/18 1029 96 %     Weight 03/25/18 1031 72 kg (158 lb 11.7 oz)     Height 03/25/18 1031 1.778 m (5\' 10" )     Head Circumference --      Peak Flow --      Pain Score 03/25/18 1030 2     Pain Loc --      Pain Edu? --      Excl. in [place]? --     Constitutional: Alert and oriented.  Curled on left side around a bucket Eyes: Conjunctivae are normal.   Nose: No congestion/rhinnorhea. Mouth/Throat: Mucous membranes are moist.    Cardiovascular: Normal rate, regular rhythm. Grossly normal heart  sounds.  Good peripheral circulation. Respiratory: Normal respiratory effort.  No retractions. Lungs CTAB. Gastrointestinal: Mild tenderness in the right upper quadrant no distention.  No CVA tenderness.  Musculoskeletal:  Warm and well perfused Neurologic:  Normal speech and language. No gross focal neurologic deficits are appreciated.  Skin:  Skin is warm, dry and intact. No rash noted. Psychiatric: Mood and affect are normal. Speech and behavior are normal.  ____________________________________________   LABS (all labs ordered are listed, but only abnormal results are displayed)  Labs Reviewed  COMPREHENSIVE METABOLIC PANEL - Abnormal; Notable for the following components:      Result Value   Sodium 131 (*)    Chloride 97 (*)    Calcium 8.8 (*)    Total Bilirubin 1.6 (*)    All other components within normal limits  CBC  TROPONIN I  LIPASE, BLOOD  URINALYSIS, COMPLETE (UACMP) WITH MICROSCOPIC   ____________________________________________  EKG  None ____________________________________________  RADIOLOGY  Ultrasound demonstrates thickened gallbladder wall ____________________________________________   PROCEDURES  Procedure(s) performed: No  Procedures   Critical Care performed: No ____________________________________________   INITIAL IMPRESSION / ASSESSMENT AND PLAN / ED COURSE  Pertinent labs & imaging results that were available during my care of the patient were reviewed by me and considered in my medical decision making (see chart for details).  Patient presents with what appears to be right upper quadrant pain, biliary colic is on the differential although only minimal tenderness in the area, pending labs, will give IV morphine, IV Zofran.  May require ultrasound versus CT will await lab work  Ultrasound demonstrates gallbladder wall thickening, large gallstone but no pericholecystic fluid and tenderness seems improved now.  Discussed with Dr. Lysle Pearl  of surgery, he is counseled on the patient, recommends admission for HIDA scan request internal medicine admission    ____________________________________________   FINAL CLINICAL IMPRESSION(S) / ED DIAGNOSES  Final diagnoses:  Upper abdominal pain  Calculus of gallbladder with cholecystitis without biliary obstruction, unspecified cholecystitis acuity        Note:  This document was prepared using Dragon voice recognition software and may  include unintentional dictation errors.   Lavonia Drafts, MD 03/25/18 1444

## 2018-03-25 NOTE — Consult Note (Signed)
Subjective:   CC: epigastric pain, fatigue, nausea  HPI:  Russell Howard is a 84 y.o. male who is consulted by Corky Downs for evaluation of above cc.  Symptoms were first noted 2 days ago. Pain is epigastric, sudden onset.  Associated with nausea, exacerbated by nothing specific.  Per daughter's report, he was recently admitted for the flu and was discharged 4 days ago.  She believes that he was slowly improving from the recent hospitalization before the onset of this abdominal pain.  Otherwise denies any changes in bowel or urinary habits.     Past Medical History:  has a past medical history of Acquired hypothyroidism (11/02/2014), BPH (benign prostatic hyperplasia), Cardiac defibrillator in place (11/02/2014), Cardiomyopathy [facility]), CHF (congestive heart failure) ([place]), Chronic diastolic heart failure ([place]) (11/02/2014), COPD (chronic obstructive pulmonary disease) ([place]), Gastro-esophageal reflux disease without esophagitis (11/02/2014), H/O malignant neoplasm (11/09/2014), Heart valve disease (11/02/2014), History of atrial fibrillation (11/02/2014), Lung cancer ([place]), Neuropathy (11/02/2014), Orthostasis (11/02/2014), Pure hypercholesterolemia (11/02/2014), and Valvular heart disease.  Past Surgical History:  has a past surgical history that includes Appendectomy; DUAL ICD IMPLANT (2007/2009); Port-a-cath removal; and Cardiac valve replacement.  Family History: family history includes CAD in his father; Heart disease in an other family member; Hypertension in an other family member.  Social History:  reports that he quit smoking about 36 years ago. His smoking use included cigarettes. He has never used smokeless tobacco. He reports that he does not drink alcohol or use drugs.  Current Medications:  Medication Details Provider Last Reconciliation Status  acidophilus (RISAQUAD) CAPS capsule Take 1 capsule by mouth every evening.  [provider] Needs Review  albuterol (PROVENTIL HFA;VENTOLIN HFA) 108  (90 Base) MCG/ACT inhaler Inhale 2 puffs into the lungs every 6 (six) hours as needed for wheezing or shortness of breath. Fritzi Mandes, MD Needs Review  apixaban (ELIQUIS) 5 MG TABS tablet Take 1 tablet (5 mg total) by mouth 2 (two) times daily. Gladstone Lighter, MD Needs Review  Ascorbic Acid (VITAMIN C) 1000 MG tablet Take 500 mg by mouth 2 (two) times daily.  [provider] Needs Review  aspirin EC 81 MG tablet Take 81 mg by mouth daily.  [provider] Needs Review  Cholecalciferol 5000 units TABS Take 5,000 Units by mouth daily. [provider] Needs Review  Coenzyme Q10 (CO Q-10) 120 MG CAPS Take 120 mg by mouth every evening. [provider] Needs Review  digoxin (LANOXIN) 0.25 MG tablet Take 0.25 mg by mouth daily.  [provider] Needs Review  feeding supplement, ENSURE ENLIVE, (ENSURE ENLIVE) LIQD Take 237 mLs by mouth 2 (two) times daily between meals. Fritzi Mandes, MD Needs Review  gabapentin (NEURONTIN) 300 MG capsule Take 600 mg by mouth 2 (two) times daily.  [provider] Needs Review  hydrocortisone (CORTEF) 10 MG tablet Take 10 mg by mouth 2 (two) times daily.  [provider] Needs Review  levothyroxine (SYNTHROID, LEVOTHROID) 100 MCG tablet Take 100 mcg by mouth daily before breakfast. [provider] Needs Review  magnesium oxide (MAG-OX) 400 (241.3 Mg) MG tablet Take 400 mg by mouth every evening. [provider] Needs Review  methenamine (HIPREX) 1 g tablet TAKE 1 TABLET BY MOUTH TWICE DAILY WITH MEALS McGowan, [place] A, PA-C Needs Review  midodrine (PROAMATINE) 10 MG tablet Take 1 tablet (10 mg total) by mouth 3 (three) times daily as needed. If systolic BP <222 Fritzi Mandes, MD Needs Review   Patient taking differently: Take 10 mg  by mouth 2 (two) times daily at 10 AM and 5 PM. If systolic BP <242    Multiple Vitamin (MULTIVITAMIN WITH MINERALS) TABS tablet Take 1 tablet by mouth daily.  [provider] Needs Review  ondansetron (ZOFRAN) 4 MG tablet Take 4 mg by mouth every 8 (eight) hours as needed for nausea or vomiting. [provider] Needs Review  pantoprazole (PROTONIX) 40 MG tablet Take 40 mg by mouth daily before breakfast.  [provider] Needs Review  pyridostigmine (MESTINON) 60 MG tablet Take 60 mg by mouth 2 (two) times daily. [provider] Needs Review  saw palmetto 500 MG capsule Take 1,000 mg by mouth every evening. [provider] Needs Review  simvastatin (ZOCOR) 20 MG tablet Take 20 mg by mouth every evening.  [provider] Needs Review  vitamin B-12 (CYANOCOBALAMIN) 500 MCG tablet Take 500 mcg by mouth daily.  [provider] Needs Review  vitamin E 400 UNIT capsule Take 400 Units by mouth every evening.  [provider] Needs Review      Allergies:  Allergies as of 03/25/2018 - Review Complete 03/25/2018  Allergen Reaction Noted  . Penicillin g Hives 11/22/2014  . Sulfa antibiotics Rash 11/22/2014    ROS:  Limited due to patient condition.  Pertinent positive and negatives noted in HPI.   Objective:     BP 101/65   Pulse 70   Resp (!) 35   Ht 5\' 10"  (1.778 m)   Wt 72 kg   SpO2 98%   BMI 22.78 kg/m    Constitutional :  appears stated age, fatigued and mild distress  Lymphatics/Throat:  no asymmetry, masses, or scars  Gastrointestinal: soft, no guarding, minimal focal tenderness in epigastric region, no tenderness in RUQ.   Musculoskeletal: Steady gait and movement  Skin: Cool and moist, abdominal surgical scars  Psychiatric: Normal affect, non-agitated, not confused       LABS:  CMP Latest Ref Rng & Units 03/25/2018 03/19/2018 02/18/2018  Glucose 70 - 99 mg/dL 99 127(H) 103(H)  BUN 8 - 23 mg/dL 15 18 18   Creatinine 0.61 - 1.24 mg/dL 0.95 0.98 1.00  Sodium 135 - 145 mmol/L 131(L) 136 137  Potassium 3.5 - 5.1 mmol/L 4.9 4.4 4.5  Chloride 98 - 111 mmol/L 97(L)  101 102  CO2 22 - 32 mmol/L 23 28 25   Calcium 8.9 - 10.3 mg/dL 8.8(L) 9.1 9.2  Total Protein 6.5 - 8.1 g/dL 8.1 7.5 7.8  Total Bilirubin 0.3 - 1.2 mg/dL 1.6(H) 0.7 1.0  Alkaline Phos 38 - 126 U/L 75 72 89  AST 15 - 41 U/L 38 20 26  ALT 0 - 44 U/L 20 15 17    CBC Latest Ref Rng & Units 03/25/2018 03/19/2018 02/18/2018  WBC 4.0 - 10.5 K/uL 10.2 11.3(H) 9.4  Hemoglobin 13.0 - 17.0 g/dL 14.8 13.6 14.8  Hematocrit 39.0 - 52.0 % 45.4 42.5 46.8  Platelets 150 - 400 K/uL 180 197 248     RADS: CLINICAL DATA:  Upper abdominal pain.  Nausea for few days.  EXAM: ULTRASOUND ABDOMEN LIMITED RIGHT UPPER QUADRANT  COMPARISON:  Body CT 05/04/2017  FINDINGS: Gallbladder:  Borderline gallbladder wall thickening, measuring up to 3.3 mm. 13 mm gallbladder stone within the gallbladder neck. No sonographic Murphy sign noted by sonographer.  Common bile duct:  Diameter: 0.3 mm  Liver:  No focal lesion identified. Within normal limits in parenchymal echogenicity. Portal vein is patent on color Doppler imaging with normal direction  of blood flow towards the liver.  IMPRESSION: Cholelithiasis with borderline gallbladder wall thickening. This, in the appropriate clinical settings, may represent early acute cholecystitis. If further imaging evaluation is desired, HIDA scan may be considered.   Electronically Signed   By: Fidela Salisbury M.D.   On: 03/25/2018 12:27 Assessment:   Epigastric pain with [place] as above, bili of 1.6 Fatigue Chronic Afib COPD  Plan:     Patient's physical exam unimpressive for acute cholecystitis.  Above ultrasound reading also equivocal for any signs of acute cholecystitis.  With a normal white count as well, would like to proceed with HIDA scan to determine if cholecystitis is the cause of all his symptoms.  Overall patient is a frail 84 year old with multiple comorbidities and recent hospitalization from the flu.  I am not sure that his overall  symptomatology can be attributed completely to gallbladder disease with the current findings.  Recommend admission and observation with hospitalist due to his multiple comorbidities.  I do comes back positive recommendation will be to proceed with a cholecystostomy tube rather than a lap cholecystectomy due to his comorbidities.  Recommend either bridging or stopping Eliquis altogether in preparation for possible cholecystostomy tube.

## 2018-03-25 NOTE — Progress Notes (Signed)
MD notified about about tachypnea respiration rate 32. Cardiac monitor started. Nausea meds given.

## 2018-03-25 NOTE — Progress Notes (Signed)
Advanced care plan.  Purpose of the Encounter: CODE STATUS  Parties in Attendance: Patient and patient's daughter-in-law  Patient's Decision Capacity: Good  Subjective/Patient's story: Presented to emergency room for abdominal pain and nausea   Objective/Medical story Has cholelithiasis and distention of gallbladder Needs evaluation with a HIDA scan for possible early cholecystitis and surgical consultation   Goals of care determination:  Advance care directives goals of care treatment plan discussed Patient and family do not want CPR, intubation ventilator if the rate arises   CODE STATUS: DNR   Time spent discussing advanced care planning: 16 minutes

## 2018-03-25 NOTE — ED Notes (Signed)
PT resting quietly on ER stretcher, NAD noted, VSS, pt states pain and nausea are under control at this time.  Pt declines remainder of pain medication.  Will continue to monitor.

## 2018-03-25 NOTE — ED Notes (Signed)
Patient transported to Ultrasound 

## 2018-03-25 NOTE — H&P (Signed)
[facility] [place] [facility] NAME: Russell Howard    MR#:  [id number]  DATE OF BIRTH:  1934-02-17  DATE OF ADMISSION:  03/25/2018  PRIMARY CARE PHYSICIAN: [name], MD   REQUESTING/REFERRING PHYSICIAN:   CHIEF COMPLAINT: Abdominal pain  HISTORY OF PRESENT ILLNESS: Russell Howard  is a 84 y.o. male with a known history of hypothyroidism, benign prostate hypertrophy, cardiomyopathy, chronic diastolic heart failure, COPD, GERD, atrial fibrillation on Eliquis for anticoagulation, lung cancer presented to the emergency room for abdominal pain of 1 day duration.  Pain is located in the right upper quadrant aching in nature 7 out of 10 on a scale of 1-10.  It also has nausea.  Was evaluated with abdominal ultrasound which showed gallstones and distention of the gallbladder wall.  No evidence of any inflammation for now.  Surgical consultant was contacted by ER who recommended HIDA scan and n.p.o. for now and hold Eliquis.  PAST MEDICAL HISTORY:   Past Medical History:  Diagnosis Date  . Acquired hypothyroidism 11/02/2014  . BPH (benign prostatic hyperplasia)   . Cardiac defibrillator in place 11/02/2014  . Cardiomyopathy ([place])   . CHF (congestive heart failure) ([place])   . Chronic diastolic heart failure ([place]) 11/02/2014  . COPD (chronic obstructive pulmonary disease) ([place])   . Gastro-esophageal reflux disease without esophagitis 11/02/2014  . H/O malignant neoplasm 11/09/2014  . Heart valve disease 11/02/2014  . History of atrial fibrillation 11/02/2014  . Lung cancer ([place])   . Neuropathy 11/02/2014  . Orthostasis 11/02/2014  . Pure hypercholesterolemia 11/02/2014  . Valvular heart disease     PAST SURGICAL HISTORY:  Past Surgical History:  Procedure Laterality Date  . APPENDECTOMY    . CARDIAC VALVE REPLACEMENT     with a bovine valve  . DUAL ICD IMPLANT  2007/2009   implantable cardiac defibrillator  . PORT-A-CATH REMOVAL      SOCIAL  HISTORY:  Social History   Tobacco Use  . Smoking status: Former Smoker    Types: Cigarettes    Last attempt to quit: 11/21/1981    Years since quitting: 36.3  . Smokeless tobacco: Never Used  Substance Use Topics  . Alcohol use: No    Alcohol/week: 0.0 standard drinks    FAMILY HISTORY:  Family History  Problem Relation Age of Onset  . Hypertension Other   . Heart disease Other   . CAD Father     DRUG ALLERGIES:  Allergies  Allergen Reactions  . Penicillin G Hives    Has patient had a PCN reaction causing immediate rash, facial/tongue/throat swelling, SOB or lightheadedness with hypotension: Yes Has patient had a PCN reaction causing severe rash involving mucus membranes or skin necrosis: No Has patient had a PCN reaction that required hospitalization No Has patient had a PCN reaction occurring within the last 10 years: No If all of the above answers are "NO", then may proceed with Cephalosporin use.  . Sulfa Antibiotics Rash    REVIEW OF SYSTEMS:   CONSTITUTIONAL: No fever, fatigue or weakness.  EYES: No blurred or double vision.  EARS, NOSE, AND THROAT: No tinnitus or ear pain.  RESPIRATORY: No cough, shortness of breath, wheezing or hemoptysis.  CARDIOVASCULAR: No chest pain, orthopnea, edema.  GASTROINTESTINAL: Has nausea, no vomiting, diarrhea  has abdominal pain.  GENITOURINARY: No dysuria, hematuria.  ENDOCRINE: No polyuria, nocturia,  HEMATOLOGY: No anemia, easy bruising or bleeding SKIN: No rash or lesion. MUSCULOSKELETAL: No joint pain  or arthritis.   NEUROLOGIC: No tingling, numbness, weakness.  PSYCHIATRY: No anxiety or depression.   MEDICATIONS AT HOME:  Prior to Admission medications   Medication Sig Start Date End Date Taking? Authorizing Provider  acidophilus (RISAQUAD) CAPS capsule Take 1 capsule by mouth every evening.    Yes [provider]  albuterol (PROVENTIL HFA;VENTOLIN HFA) 108 (90 Base) MCG/ACT inhaler Inhale 2 puffs into the  lungs every 6 (six) hours as needed for wheezing or shortness of breath. 03/21/18  Yes Fritzi Mandes, MD  apixaban (ELIQUIS) 5 MG TABS tablet Take 1 tablet (5 mg total) by mouth 2 (two) times daily. 09/02/17  Yes Gladstone Lighter, MD  Ascorbic Acid (VITAMIN C) 1000 MG tablet Take 500 mg by mouth 2 (two) times daily.    Yes [provider]  aspirin EC 81 MG tablet Take 81 mg by mouth daily.    Yes [provider]  Cholecalciferol 5000 units TABS Take 5,000 Units by mouth daily.   Yes [provider]  Coenzyme Q10 (CO Q-10) 120 MG CAPS Take 120 mg by mouth every evening.   Yes [provider]  digoxin (LANOXIN) 0.25 MG tablet Take 0.25 mg by mouth daily.    Yes [provider]  gabapentin (NEURONTIN) 300 MG capsule Take 600 mg by mouth 2 (two) times daily.    Yes [provider]  hydrocortisone (CORTEF) 10 MG tablet Take 10 mg by mouth 2 (two) times daily.    Yes [provider]  levothyroxine (SYNTHROID, LEVOTHROID) 100 MCG tablet Take 100 mcg by mouth daily before breakfast.   Yes [provider]  magnesium oxide (MAG-OX) 400 (241.3 Mg) MG tablet Take 400 mg by mouth every evening.   Yes [provider]  methenamine (HIPREX) 1 g tablet TAKE 1 TABLET BY MOUTH TWICE DAILY WITH MEALS 11/20/17  Yes McGowan, Larene Beach A, PA-C  midodrine (PROAMATINE) 10 MG tablet Take 1 tablet (10 mg total) by mouth 3 (three) times daily as needed. If systolic BP <244 Patient taking differently: Take 10 mg by mouth 2 (two) times daily at 10 AM and 5 PM. If systolic BP <010 2/72/53  Yes Fritzi Mandes, MD  Multiple Vitamin (MULTIVITAMIN WITH MINERALS) TABS tablet Take 1 tablet by mouth daily.   Yes [provider]  ondansetron (ZOFRAN) 4 MG tablet Take 4 mg by mouth every 8 (eight) hours as needed for nausea or vomiting.   Yes [provider]  pantoprazole (PROTONIX) 40 MG tablet Take 40 mg by mouth daily before breakfast.    Yes  [provider]  pyridostigmine (MESTINON) 60 MG tablet Take 60 mg by mouth 2 (two) times daily. 01/01/18  Yes [provider]  saw palmetto 500 MG capsule Take 1,000 mg by mouth every evening.   Yes [provider]  simvastatin (ZOCOR) 20 MG tablet Take 20 mg by mouth every evening.    Yes [provider]  vitamin B-12 (CYANOCOBALAMIN) 500 MCG tablet Take 500 mcg by mouth daily.    Yes [provider]  vitamin E 400 UNIT capsule Take 400 Units by mouth every evening.    Yes [provider]      PHYSICAL EXAMINATION:   VITAL SIGNS: Blood pressure 103/60, pulse 65, resp. rate (!) 27, height 5\' 10"  (1.778 m), weight 72 kg, SpO2 99 %.  GENERAL:  84 y.o.-year-old patient lying in the bed with no acute distress.  EYES: Pupils equal, round, reactive to light and accommodation.  No scleral icterus. Extraocular muscles intact.  HEENT: Head atraumatic, normocephalic. Oropharynx and nasopharynx clear.  NECK:  Supple, no jugular venous distention. No thyroid enlargement, no tenderness.  LUNGS: Normal breath sounds bilaterally, no wheezing, rales,rhonchi or crepitation. No use of accessory muscles of respiration.  CARDIOVASCULAR: S1, S2 normal. No murmurs, rubs, or gallops.  ABDOMEN: Soft, tenderness in right upper quadrant, nondistended. Bowel sounds present. No organomegaly or mass.  EXTREMITIES: No pedal edema, cyanosis, or clubbing.  NEUROLOGIC: Cranial nerves II through XII are intact. Muscle strength 5/5 in all extremities. Sensation intact. Gait not checked.  PSYCHIATRIC: The patient is alert and oriented x 3.  SKIN: No obvious rash, lesion, or ulcer.   LABORATORY PANEL:   CBC Recent Labs  Lab 03/19/18 0132 03/25/18 1100  WBC 11.3* 10.2  HGB 13.6 14.8  HCT 42.5 45.4  PLT 197 180  MCV 90.4 88.7  MCH 28.9 28.9  MCHC 32.0 32.6  RDW 14.9 14.6    ------------------------------------------------------------------------------------------------------------------  Chemistries  Recent Labs  Lab 03/19/18 0132 03/25/18 1100  NA 136 131*  K 4.4 4.9  CL 101 97*  CO2 28 23  GLUCOSE 127* 99  BUN 18 15  CREATININE 0.98 0.95  CALCIUM 9.1 8.8*  AST 20 38  ALT 15 20  ALKPHOS 72 75  BILITOT 0.7 1.6*   ------------------------------------------------------------------------------------------------------------------ estimated creatinine clearance is 62.1 mL/min (by C-G formula based on SCr of 0.95 mg/dL). ------------------------------------------------------------------------------------------------------------------ No results for input(s): TSH, T4TOTAL, T3FREE, THYROIDAB in the last 72 hours.  Invalid input(s): FREET3   Coagulation profile No results for input(s): INR, PROTIME in the last 168 hours. ------------------------------------------------------------------------------------------------------------------- No results for input(s): DDIMER in the last 72 hours. -------------------------------------------------------------------------------------------------------------------  Cardiac Enzymes Recent Labs  Lab 03/19/18 0132 03/25/18 1100  TROPONINI <0.03 <0.03   ------------------------------------------------------------------------------------------------------------------ Invalid input(s): POCBNP  ---------------------------------------------------------------------------------------------------------------  Urinalysis    Component Value Date/Time   COLORURINE YELLOW (A) 03/19/2018 0812   APPEARANCEUR CLEAR (A) 03/19/2018 0812   APPEARANCEUR Clear 10/21/2017 1544   LABSPEC 1.014 03/19/2018 0812   PHURINE 6.0 03/19/2018 0812   GLUCOSEU NEGATIVE 03/19/2018 0812   HGBUR NEGATIVE 03/19/2018 0812   BILIRUBINUR NEGATIVE 03/19/2018 0812   BILIRUBINUR Negative 12/29/2017 1512   BILIRUBINUR Negative 10/21/2017 1544    KETONESUR NEGATIVE 03/19/2018 0812   PROTEINUR NEGATIVE 03/19/2018 0812   UROBILINOGEN 0.2 12/29/2017 1512   NITRITE NEGATIVE 03/19/2018 0812   LEUKOCYTESUR TRACE (A) 03/19/2018 0812     RADIOLOGY: Dg Chest Portable 1 View  Result Date: 03/25/2018 CLINICAL DATA:  Chest discomfort, COPD. CHF. Reported right lung cancer. EXAM: PORTABLE CHEST 1 VIEW COMPARISON:  03/19/2018 chest radiograph. FINDINGS: Stable configuration of 3 lead left subclavian ICD and cardiac valvular prosthesis. Stable cardiomediastinal silhouette with mild cardiomegaly. No pneumothorax. Stable volume loss in the right hemithorax. Stable diffuse right pleural thickening. Stable chronic coarse reticular opacities throughout the right greater than left lungs. No superimposed acute consolidative airspace disease. IMPRESSION: Stable chest with no acute superimposed process. Chronic fibrotic changes in the right greater than left lungs. Electronically Signed   By: Ilona Sorrel M.D.   On: 03/25/2018 10:55   US Abdomen Limited Ruq  Result Date: 03/25/2018 CLINICAL DATA:  Upper abdominal pain.  Nausea for few days. EXAM: ULTRASOUND ABDOMEN LIMITED RIGHT UPPER QUADRANT COMPARISON:  Body CT 05/04/2017 FINDINGS: Gallbladder: Borderline gallbladder wall thickening, measuring up to 3.3 mm. 13 mm gallbladder stone within the gallbladder neck. No sonographic Murphy sign noted by sonographer. Common bile duct: Diameter: 0.3 mm Liver:  No focal lesion identified. Within normal limits in parenchymal echogenicity. Portal vein is patent on color Doppler imaging with normal direction of blood flow towards the liver. IMPRESSION: Cholelithiasis with borderline gallbladder wall thickening. This, in the appropriate clinical settings, may represent early acute cholecystitis. If further imaging evaluation is desired, HIDA scan may be considered. Electronically Signed   By: Fidela Salisbury M.D.   On: 03/25/2018 12:27    EKG: Orders placed or performed  during the hospital encounter of 03/19/18  . EKG 12-Lead  . EKG 12-Lead  . EKG 12-Lead  . EKG 12-Lead    IMPRESSION AND PLAN: 84 year old male patient with a known history of hypothyroidism, benign prostate hypertrophy, cardiomyopathy, chronic diastolic heart failure, COPD, GERD, atrial fibrillation on Eliquis for anticoagulation, lung cancer presented to the emergency room for abdominal pain of 1 day duration.  -Cholelithiasis Surgery consult HIDA scan to evaluate gallbladder function and any evidence of cholecystitis N.p.o. for now  -Acute abdominal pain secondary to gallbladder thickening and inflammation Surgery consult Pain management with IV morphine as needed IV fluids and n.p.o.  -COPD Stable continue home dose inhalers  -Chronic diastolic heart failure stable  -DVT prophylaxis Eliquis will be held as per surgery consultant recommendation Sequential compression device to lower extremities  All the records are reviewed and case discussed with ED provider. Management plans discussed with the patient, family and they are in agreement.  CODE STATUS:DNR Code Status History    Date Active Date Inactive Code Status Order ID Comments User Context   03/19/2018 0409 03/21/2018 1337 DNR [id number]  [name], MD ED   03/19/2018 0408 03/19/2018 0409 Full Code [id number]  Salary, Avel Peace, MD ED   01/15/2018 1909 01/18/2018 1510 DNR [id number]  Loletha Grayer, MD ED   10/17/2017 2259 10/20/2017 1812 DNR [id number]  Amelia Jo, MD Inpatient   08/31/2017 2104 09/02/2017 1540 DNR [id number]  Gladstone Lighter, MD Inpatient   06/21/2017 1420 06/24/2017 1512 DNR [id number]  Dustin Flock, MD Inpatient   05/22/2017 1609 05/29/2017 1740 DNR [id number]  Gladstone Lighter, MD Inpatient   11/17/2016 1631 11/20/2016 1602 DNR [id number]  Vaughan Basta, MD Inpatient   08/19/2016 1315 08/30/2016 1728 DNR [id number]  Harrie Foreman, MD Inpatient   03/02/2016 2236 03/05/2016 2020 DNR  [id number]  Mikael Spray, NP ED   01/09/2016 0658 01/14/2016 1939 DNR [id number]  Harrie Foreman, MD Inpatient   11/09/2015 0318 11/12/2015 1713 DNR [id number]  Harrie Foreman, MD Inpatient   06/14/2015 1639 06/18/2015 1513 DNR [id number]  Demetrios Loll, MD Inpatient    Questions for Most Recent Historical Code Status (Order [id number])    Question Answer Comment   In the event of cardiac or respiratory ARREST Do not call a "code blue"    In the event of cardiac or respiratory ARREST Do not perform Intubation, CPR, defibrillation or ACLS    In the event of cardiac or respiratory ARREST Use medication by any route, position, wound care, and other measures to relive pain and suffering. May use oxygen, suction and manual treatment of airway obstruction as needed for comfort.    Comments Nurse may pronounce         Advance Directive Documentation     Most Recent Value  Type of Advance Directive  Healthcare Power of Attorney, Living will  Pre-existing out of facility DNR order (yellow form or pink MOST form)  Yellow form placed in chart (order not valid for inpatient use)  "MOST" Form in Place?  -  TOTAL TIME TAKING CARE OF THIS PATIENT: 53 minutes.    Saundra Shelling M.D on 03/25/2018 at 3:21 PM  Between 7am to 6pm - Pager - [phone]  After 6pm go to [email or web address] - password [email or web address] [facility]  Office  [phone]  CC: Primary care physician; [name], MD

## 2018-03-25 NOTE — ED Notes (Signed)
First half of morphine, 2mg , given slow IV push d/t BP 104/65.  Will monitor BP and give other half if BP stable.

## 2018-03-26 DIAGNOSIS — Z7189 Other specified counseling: Secondary | ICD-10-CM

## 2018-03-26 DIAGNOSIS — Z515 Encounter for palliative care: Secondary | ICD-10-CM

## 2018-03-26 DIAGNOSIS — R1010 Upper abdominal pain, unspecified: Secondary | ICD-10-CM

## 2018-03-26 LAB — COMPREHENSIVE METABOLIC PANEL
ALBUMIN: 3.4 g/dL — AB (ref 3.5–5.0)
ALT: 17 U/L (ref 0–44)
AST: 26 U/L (ref 15–41)
Alkaline Phosphatase: 66 U/L (ref 38–126)
Anion gap: 9 (ref 5–15)
BILIRUBIN TOTAL: 0.9 mg/dL (ref 0.3–1.2)
BUN: 17 mg/dL (ref 8–23)
CO2: 24 mmol/L (ref 22–32)
Calcium: 8.2 mg/dL — ABNORMAL LOW (ref 8.9–10.3)
Chloride: 99 mmol/L (ref 98–111)
Creatinine, Ser: 0.91 mg/dL (ref 0.61–1.24)
GFR calc Af Amer: >60 mL/min (ref >60)
Glucose, Bld: 66 mg/dL — ABNORMAL LOW (ref 70–99)
Potassium: 4.4 mmol/L (ref 3.5–5.1)
Sodium: 132 mmol/L — ABNORMAL LOW (ref 135–145)
Total Protein: 7.2 g/dL (ref 6.5–8.1)

## 2018-03-26 LAB — CBC
HCT: 52.5 % — ABNORMAL HIGH (ref 39.0–52.0)
Hemoglobin: 17.3 g/dL — ABNORMAL HIGH (ref 13.0–17.0)
MCH: 28.7 pg (ref 26.0–34.0)
MCHC: 33 g/dL (ref 30.0–36.0)
MCV: 87.2 fL (ref 80.0–100.0)
NRBC: 0 % (ref 0.0–0.2)
Platelets: UNDETERMINED 10*3/uL (ref 150–400)
RBC: 6.02 MIL/uL — ABNORMAL HIGH (ref 4.22–5.81)
RDW: 14.7 % (ref 11.5–15.5)
WBC: 7.8 10*3/uL (ref 4.0–10.5)

## 2018-03-26 LAB — HEPATIC FUNCTION PANEL
ALT: 17 U/L (ref 0–44)
AST: 28 U/L (ref 15–41)
Albumin: 3.2 g/dL — ABNORMAL LOW (ref 3.5–5.0)
Alkaline Phosphatase: 64 U/L (ref 38–126)
Bilirubin, Direct: 0.2 mg/dL (ref 0.0–0.2)
Indirect Bilirubin: 0.7 mg/dL (ref 0.3–0.9)
Total Bilirubin: 0.9 mg/dL (ref 0.3–1.2)
Total Protein: 7.5 g/dL (ref 6.5–8.1)

## 2018-03-26 LAB — TSH: TSH: 3.868 u[IU]/mL (ref 0.350–4.500)

## 2018-03-26 MED ORDER — APIXABAN 5 MG PO TABS
5.0000 mg | ORAL_TABLET | Freq: Two times a day (BID) | ORAL | Status: DC
  Administered 2018-03-26 – 2018-03-28 (×4): 5 mg via ORAL
  Filled 2018-03-26 (×4): qty 1

## 2018-03-26 MED ORDER — GABAPENTIN 100 MG PO CAPS
100.0000 mg | ORAL_CAPSULE | Freq: Three times a day (TID) | ORAL | Status: DC
  Administered 2018-03-26 – 2018-03-28 (×4): 100 mg via ORAL
  Filled 2018-03-26 (×4): qty 1

## 2018-03-26 MED ORDER — HYDROCORTISONE 10 MG PO TABS
10.0000 mg | ORAL_TABLET | Freq: Two times a day (BID) | ORAL | Status: DC
  Administered 2018-03-26 – 2018-03-28 (×2): 10 mg via ORAL
  Filled 2018-03-26 (×5): qty 1

## 2018-03-26 MED ORDER — MUPIROCIN 2 % EX OINT
TOPICAL_OINTMENT | Freq: Two times a day (BID) | CUTANEOUS | Status: DC
  Administered 2018-03-26 – 2018-03-27 (×4): via NASAL
  Filled 2018-03-26: qty 22

## 2018-03-26 MED ORDER — ACETAMINOPHEN 325 MG PO TABS
650.0000 mg | ORAL_TABLET | Freq: Three times a day (TID) | ORAL | Status: DC
  Administered 2018-03-26 – 2018-03-28 (×5): 650 mg via ORAL
  Filled 2018-03-26 (×5): qty 2

## 2018-03-26 MED ORDER — LEVALBUTEROL HCL 0.63 MG/3ML IN NEBU
0.6300 mg | INHALATION_SOLUTION | Freq: Three times a day (TID) | RESPIRATORY_TRACT | Status: DC
  Filled 2018-03-26 (×2): qty 3

## 2018-03-26 MED ORDER — TECHNETIUM TC 99M MEBROFENIN IV KIT
5.3200 | PACK | Freq: Once | INTRAVENOUS | Status: AC | PRN
  Administered 2018-03-26: 5.32 via INTRAVENOUS

## 2018-03-26 MED ORDER — GUAIFENESIN 100 MG/5ML PO SOLN
5.0000 mL | ORAL | Status: DC | PRN
  Filled 2018-03-26: qty 5

## 2018-03-26 MED ORDER — IPRATROPIUM BROMIDE 0.02 % IN SOLN: 0.5000 mg | Freq: Three times a day (TID) | RESPIRATORY_TRACT | Status: DC

## 2018-03-26 MED ORDER — OXYCODONE HCL 5 MG PO TABS
5.0000 mg | ORAL_TABLET | Freq: Three times a day (TID) | ORAL | Status: DC | PRN
  Administered 2018-03-26: 5 mg via ORAL
  Filled 2018-03-26: qty 1

## 2018-03-26 MED ORDER — BUDESONIDE 0.5 MG/2ML IN SUSP
0.5000 mg | Freq: Two times a day (BID) | RESPIRATORY_TRACT | Status: DC
  Administered 2018-03-27: 0.5 mg via RESPIRATORY_TRACT
  Filled 2018-03-26 (×3): qty 2

## 2018-03-26 MED ORDER — IPRATROPIUM BROMIDE 0.02 % IN SOLN
0.5000 mg | Freq: Three times a day (TID) | RESPIRATORY_TRACT | Status: DC
  Administered 2018-03-26 – 2018-03-27 (×2): 0.5 mg via RESPIRATORY_TRACT
  Filled 2018-03-26 (×5): qty 2.5

## 2018-03-26 MED ORDER — LEVALBUTEROL HCL 0.63 MG/3ML IN NEBU
0.6300 mg | INHALATION_SOLUTION | Freq: Three times a day (TID) | RESPIRATORY_TRACT | Status: DC
  Administered 2018-03-26 – 2018-03-27 (×2): 0.63 mg via RESPIRATORY_TRACT
  Filled 2018-03-26 (×9): qty 3

## 2018-03-26 MED ORDER — HYDROCORTISONE 10 MG PO TABS
10.0000 mg | ORAL_TABLET | Freq: Two times a day (BID) | ORAL | Status: DC
  Administered 2018-03-26: 10 mg via ORAL
  Filled 2018-03-26 (×3): qty 1

## 2018-03-26 NOTE — Consult Note (Signed)
ANTICOAGULATION CONSULT NOTE - Initial Consult  Pharmacy Consult for Apixiban Indication: atrial fibrillation  Allergies  Allergen Reactions  . Penicillin G Hives    Has patient had a PCN reaction causing immediate rash, facial/tongue/throat swelling, SOB or lightheadedness with hypotension: Yes Has patient had a PCN reaction causing severe rash involving mucus membranes or skin necrosis: No Has patient had a PCN reaction that required hospitalization No Has patient had a PCN reaction occurring within the last 10 years: No If all of the above answers are "NO", then may proceed with Cephalosporin use.  . Sulfa Antibiotics Rash    Patient Measurements: Height: 5\' 10"  (177.8 cm) Weight: 158 lb 11.7 oz (72 kg) IBW/kg (Calculated) : 73  Vital Signs: Temp: 97.6 F (36.4 C) (02/21 1326) Temp Source: Oral (02/21 1326) BP: 113/69 (02/21 1326) Pulse Rate: 101 (02/21 1326)  Labs: Recent Labs    03/25/18 1100 03/26/18 0529  HGB 14.8 17.3*  HCT 45.4 52.5*  PLT 180 PLATELET CLUMPS NOTED ON SMEAR, UNABLE TO ESTIMATE  CREATININE 0.95 0.91  TROPONINI <0.03  --     Estimated Creatinine Clearance: 64.8 mL/min (by C-G formula based on SCr of 0.91 mg/dL).   Medical History: Past Medical History:  Diagnosis Date  . Acquired hypothyroidism 11/02/2014  . BPH (benign prostatic hyperplasia)   . Cardiac defibrillator in place 11/02/2014  . Cardiomyopathy ([place])   . CHF (congestive heart failure) ([place])   . Chronic diastolic heart failure ([place]) 11/02/2014  . COPD (chronic obstructive pulmonary disease) ([place])   . Gastro-esophageal reflux disease without esophagitis 11/02/2014  . H/O malignant neoplasm 11/09/2014  . Heart valve disease 11/02/2014  . History of atrial fibrillation 11/02/2014  . Lung cancer ([place])   . Neuropathy 11/02/2014  . Orthostasis 11/02/2014  . Pure hypercholesterolemia 11/02/2014  . Valvular heart disease     Medications:   Patient dose Eliquis 5mg  bid prior to  admission  Assessment: Patient was on Eliquis prior to admission - It was being help for possible surgery.  Restarting per Dr Rolly Pancake consult.  Goal of Therapy:   Monitor platelets by anticoagulation protocol: Yes   Plan:  Will continue pt home dose of Eliquis 5mg  bid  Lu Duffel, PharmD, BCPS Clinical Pharmacist 03/26/2018 3:11 PM;

## 2018-03-26 NOTE — Consult Note (Signed)
Consultation Note Date: 03/26/2018   Patient Name: Russell Howard  DOB: [birth year]  MRN: [id number]  Age / Sex: 84 y.o., male  PCP: [name], MD Referring Physician: [name], MD  Reason for Consultation: Establishing goals of care  HPI/Patient Profile: 84 y.o. male  with past medical history of hypothyroidism, BPH requiring self caths, lung cancer w/ radiation fibrosis, home oxygen, cardiomyopathy, CHF, COPD, GERD, and a fib on eliquis  admitted on 03/25/2018 with abdominal pain.  Patient had HIDA scan - not felt to have acute cholecystitis. Diagnosed with acute cholelithiasis. Patient had recent admission for flu in beginning of Feb. Patient was follow by OP palliative at home. PMT consulted for [place].  Clinical Assessment and Goals of Care: I have reviewed medical records including EPIC notes, labs and imaging, received report from RN, assessed the patient and then spoke with patient's DIL, [name],  to discuss diagnosis prognosis, GOC, EOL wishes, disposition and options.  Patient was sleeping when I went to see him and did not want to discuss [place]. He had received pain medicine and was drowsy. [name] is HCPOA.  I introduced Palliative Medicine as specialized medical care for people living with serious illness. It focuses on providing relief from the symptoms and stress of a serious illness. The goal is to improve quality of life for both the patient and the family.  As far as functional and nutritional status, [name] tells me he is mostly sitting or in bed. He is able to use a walker to ambulate short distances. Tells me he has a good appetite and cognition is intact. She describes that he has good days and bad days. She tells me on his "good days" he spends time with the dog and plays games on his phone.   We discussed his current illness and what it means in the larger context of his on-going co-morbidities.  Natural disease trajectory and  expectations at EOL were discussed.  [name] admits that he has been having a hard time lately but describes the frequent hospitalizations as acute events and tells me that when he comes home he does quite well. She feels he has good quality of life.   [name] tells me they are familiar with palliative care and have seen palliative care outpatient - however, they request cessation of palliative care services as they felt hospice was being pushed. [name] tells me that the patient is "not ready" for hospice services and she would like to continue aggressive medical interventions until he tells her he is ready to transition to comfort focused care. She tells me they are currently receiving IV fluids with home health and she knows this will not be continued with hospice services  - therefore, they are not interested in hospice.   They have set a limit of DNR and [name] tells me she will know when it is "his time" but right now she does not have a peace about transitioning away from aggressive medical care and she believes the patient feels the same.   I discussed with her that we seek to make appropriate medical recommendations, but we ultimately support the patient and family's goals for care.   Questions and concerns were addressed.   The family was encouraged to call with questions or concerns.   Primary Decision Maker PATIENT - joined by his DIL and HCPOA, [name]   SUMMARY OF RECOMMENDATIONS   Continue current care/DNR Family not interested in hospice or outpatient palliative - d/c outpatient palliative  Code Status/Advance Care Planning:  DNR   Symptom Management:   Continue prn oxycodone and zofran- patient resting comfortably currently on this regimen  Palliative Prophylaxis:   Aspiration and Frequent Pain Assessment  Additional Recommendations (Limitations, Scope, Preferences):  Full Scope Treatment  Psycho-social/Spiritual:   Desire for further Chaplaincy support:no  Additional  Recommendations: Education on Hospice  Prognosis:   < 6 months  Discharge Planning: Home with Home Health      Primary Diagnoses: Present on Admission: . Cholelithiasis   I have reviewed the medical record, interviewed the patient and family, and examined the patient. The following aspects are pertinent.  Past Medical History:  Diagnosis Date  . Acquired hypothyroidism 11/02/2014  . BPH (benign prostatic hyperplasia)   . Cardiac defibrillator in place 11/02/2014  . Cardiomyopathy ([place])   . CHF (congestive heart failure) ([place])   . Chronic diastolic heart failure ([place]) 11/02/2014  . COPD (chronic obstructive pulmonary disease) ([place])   . Gastro-esophageal reflux disease without esophagitis 11/02/2014  . H/O malignant neoplasm 11/09/2014  . Heart valve disease 11/02/2014  . History of atrial fibrillation 11/02/2014  . Lung cancer ([place])   . Neuropathy 11/02/2014  . Orthostasis 11/02/2014  . Pure hypercholesterolemia 11/02/2014  . Valvular heart disease    Social History   Socioeconomic History  . Marital status: Widowed    Spouse name: Not on file  . Number of children: Not on file  . Years of education: Not on file  . Highest education level: Not on file  Occupational History  . Not on file  Social Needs  . Financial resource strain: Not on file  . Food insecurity:    Worry: Not on file    Inability: Not on file  . Transportation needs:    Medical: Not on file    Non-medical: Not on file  Tobacco Use  . Smoking status: Former Smoker    Types: Cigarettes    Last attempt to quit: 11/21/1981    Years since quitting: 36.3  . Smokeless tobacco: Never Used  Substance and Sexual Activity  . Alcohol use: No    Alcohol/week: 0.0 standard drinks  . Drug use: No  . Sexual activity: Not Currently  Lifestyle  . Physical activity:    Days per week: Not on file    Minutes per session: Not on file  . Stress: Not on file  Relationships  . Social connections:    Talks on  phone: Not on file    Gets together: Not on file    Attends religious service: Not on file    Active member of club or organization: Not on file    Attends meetings of clubs or organizations: Not on file    Relationship status: Not on file  Other Topics Concern  . Not on file  Social History Narrative   Live at home with daughter   Ambulates with a walker at baseline   Family History  Problem Relation Age of Onset  . Hypertension Other   . Heart disease Other   . CAD Father    Scheduled Meds: . acetaminophen  650 mg Oral TID  . apixaban  5 mg Oral BID  . budesonide (PULMICORT) nebulizer solution  0.5 mg Nebulization BID  . digoxin  0.25 mg Oral Daily  . hydrocortisone  10 mg Oral BID  . ipratropium  0.5 mg Nebulization TID  . levalbuterol  0.63 mg Nebulization Q8H  . levothyroxine  100 mcg Oral Q0600  .  mupirocin ointment   Nasal BID  . pyridostigmine  60 mg Oral BID   Continuous Infusions: . sodium chloride 75 mL/hr at 03/26/18 0300   PRN Meds:.albuterol, ondansetron **OR** ondansetron (ZOFRAN) IV, oxyCODONE, senna-docusate Allergies  Allergen Reactions  . Penicillin G Hives    Has patient had a PCN reaction causing immediate rash, facial/tongue/throat swelling, SOB or lightheadedness with hypotension: Yes Has patient had a PCN reaction causing severe rash involving mucus membranes or skin necrosis: No Has patient had a PCN reaction that required hospitalization No Has patient had a PCN reaction occurring within the last 10 years: No If all of the above answers are "NO", then may proceed with Cephalosporin use.  . Sulfa Antibiotics Rash   Review of Systems  Unable to perform ROS: Other    Physical Exam Constitutional:      General: He is sleeping. He is not in acute distress. HENT:     Head: Normocephalic and atraumatic.  Cardiovascular:     Rate and Rhythm: Regular rhythm. Tachycardia present.  Pulmonary:     Effort: Tachypnea present.  Neurological:      Mental Status: He is oriented to person, place, and time.     Vital Signs: BP 113/69 (BP Location: Right Arm)   Pulse (!) 101   Temp 97.6 F (36.4 C) (Oral)   Resp (!) 35   Ht 5\' 10"  (1.778 m)   Wt 72 kg   SpO2 95%   BMI 22.78 kg/m  Pain Scale: 0-10   Pain Score: 6    SpO2: SpO2: 95 % O2 Device:SpO2: 95 % O2 Flow Rate: .O2 Flow Rate (L/min): 5 L/min  IO: Intake/output summary:   Intake/Output Summary (Last 24 hours) at 03/26/2018 1554 Last data filed at 03/26/2018 1335 Gross per 24 hour  Intake 621.23 ml  Output 475 ml  Net 146.23 ml    LBM:   Baseline Weight: Weight: 72 kg Most recent weight: Weight: 72 kg     Palliative Assessment/Data: PPS 30%    Time Total: 60 minutes Greater than 50%  of this time was spent counseling and coordinating care related to the above assessment and plan.  Juel Burrow, DNP, AGNP-C Palliative Medicine Team [phone] Pager: [phone]

## 2018-03-26 NOTE — Evaluation (Addendum)
Clinical/Bedside Swallow Evaluation Patient Details  Name: Russell Howard MRN: [id number] Date of Birth: 15-Apr-1934  Today's Date: 03/26/2018 Time: SLP Start Time (ACUTE ONLY): 1605 SLP Stop Time (ACUTE ONLY): 1645 SLP Time Calculation (min) (ACUTE ONLY): 40 min  Past Medical History:  Past Medical History:  Diagnosis Date  . Acquired hypothyroidism 11/02/2014  . BPH (benign prostatic hyperplasia)   . Cardiac defibrillator in place 11/02/2014  . Cardiomyopathy ([place])   . CHF (congestive heart failure) ([place])   . Chronic diastolic heart failure ([place]) 11/02/2014  . COPD (chronic obstructive pulmonary disease) ([place])   . Gastro-esophageal reflux disease without esophagitis 11/02/2014  . H/O malignant neoplasm 11/09/2014  . Heart valve disease 11/02/2014  . History of atrial fibrillation 11/02/2014  . Lung cancer ([place])   . Neuropathy 11/02/2014  . Orthostasis 11/02/2014  . Pure hypercholesterolemia 11/02/2014  . Valvular heart disease    Past Surgical History:  Past Surgical History:  Procedure Laterality Date  . APPENDECTOMY    . CARDIAC VALVE REPLACEMENT     with a bovine valve  . DUAL ICD IMPLANT  2007/2009   implantable cardiac defibrillator  . PORT-A-CATH REMOVAL     HPI:  Pt is an 84 y.o. male with past medical history of multiple medical issues including GERD, Hiatal Hernia, hypothyroidism, BPH requiring self caths, lung cancer w/ radiation fibrosis, home oxygen, cardiomyopathy, CHF, COPD, and afib on eliquis who admitted on 03/25/2018 with abdominal pain.  Patient had HIDA scan - not felt to have acute cholecystitis. Diagnosed with acute cholelithiasis.  Pt reported pain is located in the right upper quadrant aching in nature 7 out of 10 on a scale of 1-10.  It also has nausea.  Was evaluated with abdominal ultrasound which showed gallstones and distention of the gallbladder wall.  No evidence of any inflammation for now. Surgery and GI to follow per NSG.  Patient had recent admission for  flu in beginning of Feb. Patient was follow by OP palliative at home. Multiple CXRs in the past ~2 years have similar appearance of right hemithorax volume loss and diffuse pleural and parenchymal disease - suspect could be a result of aspiration of REFLUX as pt has a MODERATE Hiatal Hernia per CT of the Abd. in 2019.  Pt is verbally responsive and follows instructions appropriately but appears uncomfortable lying in bed. He reports no specific pain though. Noted N/V bags in bed next to him w/ dried phlegm.   Assessment / Plan / Recommendation Clinical Impression  This was a limited evaluation d/t pt's GI status and MD order for a Clear Liquid diet only at this time; also d/t pt's cooperation and decline of po's. He has reported no appetite to NSG and continues to NOT want much po of the clear liquid diet ordered. Pt appears to present w/ adequate oropharyngeal phase swallowing function w/ reduced risk for aspiration when following general aspiration precautions. He DOES have a baseline of GERD and Moderate Hiatal Hernia per chart notes and MUST follow REFLUX precautions as he is at increased risk for aspiration of Reflux. He was verbally responsive and agreed to min more upright positioning in bed for po trials of liquids w/ SLP.  No overt s/s of aspiration w/ any of the bolus trials; no decline in respiratory status from his baseline status and presentation; no overt coughing or wet vocal quality noted following trials. Oral phase was appropriate for adequate bolus management, A-P transit times, and oral clearing. Pt fed self w/ support  to help hold cup to drink; more feeding assistance w/ foods would be needed probably. OM exam revealed no unilateral weakness or decreased ROM during movements and bolus management. Recommend his current clear liquid diet w/ thin liquid consistency w/ general aspiration precautions; strict REFLUX PRECAUTIONS; Pills in puree if necessary for easier swallowing. Recommend soft,  easy to eat foods once pt upgrades to solid foods for energy conservation d/t deconditioning. NSG and pt educated on above. Pt appears at his baseline for oropharyngeal swallowing of current diet. ST services can be available for further education as needed while admitted. NSG agreed.  Of note, re: Reflux precautions, pt requests to lie down stating he "breaths better this way". D/t the GERD and Hiatal Hernia, pt is recommended to remain upright during/post meals for ~60 mins to lessen risk for Regurgitation and aspiration of such that could impact his Pulmonary status. NSG updated and will help educate pt.  SLP Visit Diagnosis: Dysphagia, unspecified (R13.10)    Aspiration Risk  (reduced from oropharyngeal following general precautions)    Diet Recommendation  continue current Clear liquid diet (thin consistency); general aspiration precautions; STRICT REFLUX PRECAUTIONS d/t Hiatal Hernia and GERD  Medication Administration: Whole meds with liquid(or Whole w/ a puree if necessary for easier swallowing)    Other  Recommendations Recommended Consults: Consider GI evaluation;Consider esophageal assessment(Dietician f/u) Oral Care Recommendations: Oral care BID;Staff/trained caregiver to provide oral care Other Recommendations: (n/a)   Follow up Recommendations None      Frequency and Duration (n/a)  (n/a)       Prognosis Prognosis for Safe Diet Advancement: Fair(-Good) Barriers to Reach Goals: (deconditioning; Hiatal hernia)      Swallow Study   General Date of Onset: 03/25/18 HPI: Pt is an 84 y.o. male with past medical history of multiple medical issues including GERD, Hiatal Hernia, hypothyroidism, BPH requiring self caths, lung cancer w/ radiation fibrosis, home oxygen, cardiomyopathy, CHF, COPD, and afib on eliquis who admitted on 03/25/2018 with abdominal pain.  Patient had HIDA scan - not felt to have acute cholecystitis. Diagnosed with acute cholelithiasis.  Pt reported pain is  located in the right upper quadrant aching in nature 7 out of 10 on a scale of 1-10.  It also has nausea.  Was evaluated with abdominal ultrasound which showed gallstones and distention of the gallbladder wall.  No evidence of any inflammation for now. Surgery and GI to follow per NSG.  Patient had recent admission for flu in beginning of Feb. Patient was follow by OP palliative at home. Multiple CXRs in the past ~2 years have similar appearance of right hemithorax volume loss and diffuse pleural and parenchymal disease - suspect could be a result of aspiration of REFLUX as pt has a MODERATE Hiatal Hernia per CT of the Abd. in 2019.  Pt is verbally responsive and follows instructions appropriately but appears uncomfortable lying in bed. He reports no specific pain though. Noted N/V bags in bed next to him w/ dried phlegm.  Type of Study: Bedside Swallow Evaluation Previous Swallow Assessment: none reported Diet Prior to this Study: Thin liquids(clear liquid diet d/t GI) Temperature Spikes Noted: No(wbc 7.8) Respiratory Status: Nasal cannula(2-5 liters) History of Recent Intubation: No Behavior/Cognition: Alert;Cooperative;Distractible(not feeling well) Oral Cavity Assessment: Within Functional Limits Oral Care Completed by SLP: Recent completion by staff Oral Cavity - Dentition: Adequate natural dentition;Missing dentition(few) Vision: Functional for self-feeding Self-Feeding Abilities: Able to feed self;Needs assist Patient Positioning: Postural control adequate for testing(requested not to sit up  too much(discomfort?)) Baseline Vocal Quality: Normal Volitional Cough: Strong Volitional Swallow: Able to elicit    Oral/Motor/Sensory Function Overall Oral Motor/Sensory Function: Within functional limits   Ice Chips Ice chips: Not tested   Thin Liquid Thin Liquid: Within functional limits Presentation: Self Fed;Straw(supported cup w/ pt; 4 trials) Other Comments: nothing further accepted by pt     Nectar Thick Nectar Thick Liquid: Not tested   Honey Thick Honey Thick Liquid: Not tested   Puree Puree: Not tested   Solid     Solid: Not tested       Orinda Kenner, MS, CCC-SLP Watson,Katherine 03/26/2018,5:27 PM

## 2018-03-26 NOTE — Progress Notes (Addendum)
[facility] [place] NAME: Ruth Tully    MR#:  [id number]  DATE OF BIRTH:  [birth year]  SUBJECTIVE:  CHIEF COMPLAINT:  Patient denies abdominal pain, per general surgery/Dr. Naomie Dean scan noted for normal feeling, does not believe that this is related to acute cholecystitis, diet advanced to clear liquids, on 2 L via nasal cannula chronically at home-currently on 5 L, discussed with nursing staff will wean O2 off as tolerated, physical therapy to see  REVIEW OF SYSTEMS:  CONSTITUTIONAL: No fever, fatigue or weakness.  EYES: No blurred or double vision.  EARS, NOSE, AND THROAT: No tinnitus or ear pain.  RESPIRATORY: No cough, shortness of breath, wheezing or hemoptysis.  CARDIOVASCULAR: No chest pain, orthopnea, edema.  GASTROINTESTINAL: No nausea, vomiting, diarrhea or abdominal pain.  GENITOURINARY: No dysuria, hematuria.  ENDOCRINE: No polyuria, nocturia,  HEMATOLOGY: No anemia, easy bruising or bleeding SKIN: No rash or lesion. MUSCULOSKELETAL: No joint pain or arthritis.   NEUROLOGIC: No tingling, numbness, weakness.  PSYCHIATRY: No anxiety or depression.   ROS  DRUG ALLERGIES:   Allergies  Allergen Reactions  . Penicillin G Hives    Has patient had a PCN reaction causing immediate rash, facial/tongue/throat swelling, SOB or lightheadedness with hypotension: Yes Has patient had a PCN reaction causing severe rash involving mucus membranes or skin necrosis: No Has patient had a PCN reaction that required hospitalization No Has patient had a PCN reaction occurring within the last 10 years: No If all of the above answers are "NO", then may proceed with Cephalosporin use.  . Sulfa Antibiotics Rash    VITALS:  Blood pressure 113/69, pulse (!) 101, temperature 97.6 F (36.4 C), temperature source Oral, resp. rate (!) 35, height 5\' 10"  (1.778 m), weight 72 kg, SpO2 95 %.  PHYSICAL EXAMINATION:  GENERAL:  84 y.o.-year-old patient  lying in the bed with no acute distress.  EYES: Pupils equal, round, reactive to light and accommodation. No scleral icterus. Extraocular muscles intact.  HEENT: Head atraumatic, normocephalic. Oropharynx and nasopharynx clear.  NECK:  Supple, no jugular venous distention. No thyroid enlargement, no tenderness.  LUNGS: Normal breath sounds bilaterally, no wheezing, rales,rhonchi or crepitation. No use of accessory muscles of respiration.  CARDIOVASCULAR: S1, S2 normal. No murmurs, rubs, or gallops.  ABDOMEN: Soft, nontender, nondistended. Bowel sounds present. No organomegaly or mass.  EXTREMITIES: No pedal edema, cyanosis, or clubbing.  NEUROLOGIC: Cranial nerves II through XII are intact. Muscle strength 5/5 in all extremities. Sensation intact. Gait not checked.  PSYCHIATRIC: The patient is alert and oriented x 3.  SKIN: No obvious rash, lesion, or ulcer.   Physical Exam LABORATORY PANEL:   CBC Recent Labs  Lab 03/26/18 0529  WBC 7.8  HGB 17.3*  HCT 52.5*  PLT PLATELET CLUMPS NOTED ON SMEAR, UNABLE TO ESTIMATE   ------------------------------------------------------------------------------------------------------------------  Chemistries  Recent Labs  Lab 03/26/18 0529  NA 132*  K 4.4  CL 99  CO2 24  GLUCOSE 66*  BUN 17  CREATININE 0.91  CALCIUM 8.2*  AST 28  26  ALT 17  17  ALKPHOS 64  66  BILITOT 0.9  0.9   ------------------------------------------------------------------------------------------------------------------  Cardiac Enzymes Recent Labs  Lab 03/25/18 1100  TROPONINI <0.03   ------------------------------------------------------------------------------------------------------------------  RADIOLOGY:  Nm Hepatobiliary Liver Func  Result Date: 03/26/2018 CLINICAL DATA:  Right upper quadrant abdominal pain. EXAM: NUCLEAR MEDICINE HEPATOBILIARY IMAGING WITH GALLBLADDER EF TECHNIQUE: Sequential images of the abdomen were obtained out to 60  minutes following intravenous administration of radiopharmaceutical. After oral ingestion of Ensure, patient declined further imaging and therefore ejection fraction could not be obtained. RADIOPHARMACEUTICALS:  5.32 mCi Tc-48m  Choletec IV COMPARISON:  Ultrasound of March 25, 2018. FINDINGS: Prompt uptake and biliary excretion of activity by the liver is seen. Gallbladder activity is visualized, consistent with patency of cystic duct. Biliary activity passes into small bowel, consistent with patent common bile duct. As patient refused further imaging after Ensure administration, gallbladder ejection fraction could not be calculated. IMPRESSION: Normal filling of gallbladder is noted. Patient declined imaging after injury administration and therefore gallbladder ejection fraction could not be calculated. Electronically Signed   By: Marijo Conception, M.D.   On: 03/26/2018 09:41   Dg Chest Portable 1 View  Result Date: 03/25/2018 CLINICAL DATA:  Chest discomfort, COPD. CHF. Reported right lung cancer. EXAM: PORTABLE CHEST 1 VIEW COMPARISON:  03/19/2018 chest radiograph. FINDINGS: Stable configuration of 3 lead left subclavian ICD and cardiac valvular prosthesis. Stable cardiomediastinal silhouette with mild cardiomegaly. No pneumothorax. Stable volume loss in the right hemithorax. Stable diffuse right pleural thickening. Stable chronic coarse reticular opacities throughout the right greater than left lungs. No superimposed acute consolidative airspace disease. IMPRESSION: Stable chest with no acute superimposed process. Chronic fibrotic changes in the right greater than left lungs. Electronically Signed   By: Ilona Sorrel M.D.   On: 03/25/2018 10:55   US Abdomen Limited Ruq  Result Date: 03/25/2018 CLINICAL DATA:  Upper abdominal pain.  Nausea for few days. EXAM: ULTRASOUND ABDOMEN LIMITED RIGHT UPPER QUADRANT COMPARISON:  Body CT 05/04/2017 FINDINGS: Gallbladder: Borderline gallbladder wall thickening,  measuring up to 3.3 mm. 13 mm gallbladder stone within the gallbladder neck. No sonographic Murphy sign noted by sonographer. Common bile duct: Diameter: 0.3 mm Liver: No focal lesion identified. Within normal limits in parenchymal echogenicity. Portal vein is patent on color Doppler imaging with normal direction of blood flow towards the liver. IMPRESSION: Cholelithiasis with borderline gallbladder wall thickening. This, in the appropriate clinical settings, may represent early acute cholecystitis. If further imaging evaluation is desired, HIDA scan may be considered. Electronically Signed   By: Fidela Salisbury M.D.   On: 03/25/2018 12:27    ASSESSMENT AND PLAN:  84 year old male patient with a known history of hypothyroidism, benign prostate hypertrophy, cardiomyopathy, chronic diastolic heart failure, COPD, GERD, atrial fibrillation on Eliquis for anticoagulation, lung cancer presented to the emergency room for abdominal pain of 1 day duration.  *Acute cholelithiasis Stable Acute cholecystitis has been ruled out, general surgery input appreciated, HIDA scan noted for normal filling, diet advanced to clear liquids, Eliquis on hold in the event patient may require cholecystostomy/surgical procedure  *Chronic adult failure to thrive, malnutrition Dietary consulted, aspiration/fall/skin care precautions while in house, assistance with meals Long-term prognosis is poor  *COPD without exacerbation Stable  continue home dose inhalers  *Chronic diastolic heart failure not exacerbation  stable Continue current regiment  *History of atrial fibrillation Eliquis on hold in the event patient may require surgical intervention/cholecystostomy tube placement Continue digoxin  *Chronic hypothyroidism, unspecified Check TSH and continue Synthroid  *Chronic immobility syndrome Physical therapy to evaluate/treat  DVT prophylaxis-SCDs Long-term prognosis is poor given multiple  comorbidities-palliative care consulted, PT/speech therapy consulted to evaluate/treat  All the records are reviewed and case discussed with Care Management/Social Workerr. Management plans discussed with the patient, family and they are in agreement.  CODE STATUS: dnr  TOTAL TIME TAKING CARE OF THIS PATIENT: 35 minutes.  POSSIBLE D/C IN 1-2 DAYS, DEPENDING ON CLINICAL CONDITION.   Avel Peace Venissa Nappi M.D on 03/26/2018   Between 7am to 6pm - Pager - [phone]  After 6pm go to [email or web address] - password [email or web address] [facility]  Office  [phone]  CC: Primary care physician; [name], MD  Note: This dictation was prepared with Dragon dictation along with smaller phrase technology. Any transcriptional errors that result from this process are unintentional.

## 2018-03-26 NOTE — Progress Notes (Signed)
Surgeon suggested the hospitalist to consult GI.

## 2018-03-26 NOTE — Progress Notes (Signed)
   03/26/18 1500  Clinical Encounter Type  Visited With Patient not available;Health care provider  Visit Type Initial  Referral From Physician  Spiritual Encounters  Spiritual Needs Prayer   OR received to complete or update an AD. Upon arrival to the room, the patient vocalized that he was asleep. Will attempt to visit at a later time.

## 2018-03-26 NOTE — Progress Notes (Addendum)
Subjective:  CC:  Russell Howard is a 84 y.o. male  Hospital stay day 1,   abdominal pain  HPI: No major improvement of specific complaints overnight.  HIDA negative.  ROS:  General: Denies weight loss, weight gain, fatigue, fevers, chills, and night sweats. Heart: Denies chest pain, leg pain or swelling, and decreased activity tolerance. Respiratory: Denies breathing difficulty, shortness of breath, wheezing, cough, and sputum. GI: Denies change in appetite, heartburn, nausea, vomiting, constipation, diarrhea, and blood in stool. GU: Denies difficulty urinating, pain with urinating, urgency, frequency, blood in urine.   Objective:      Temp:  [97.6 F (36.4 C)-99.2 F (37.3 C)] 97.6 F (36.4 C) (02/21 1326) Pulse Rate:  [65-114] 101 (02/21 1326) Resp:  [24-40] 35 (02/21 1347) BP: (93-148)/(50-82) 113/69 (02/21 1326) SpO2:  [95 %-100 %] 95 % (02/21 1326)     Height: 5\' 10"  (177.8 cm) Weight: 72 kg BMI (Calculated): 22.78   Intake/Output this shift:   Intake/Output Summary (Last 24 hours) at 03/26/2018 1428 Last data filed at 03/26/2018 1335 Gross per 24 hour  Intake 621.23 ml  Output 475 ml  Net 146.23 ml     Constitutional :  alert, cooperative, appears stated age and mild distress  Respiratory:  clear to auscultation bilaterally  Cardiovascular:  irregularly irregular rhythm  Gastrointestinal: soft, no guarding, epigastric tenderness today.   Skin: Cool and moist.   Psychiatric: Normal affect, non-agitated, not confused       LABS:  CMP Latest Ref Rng & Units 03/26/2018 03/26/2018 03/25/2018  Glucose 70 - 99 mg/dL - 66(L) 99  BUN 8 - 23 mg/dL - 17 15  Creatinine 0.61 - 1.24 mg/dL - 0.91 0.95  Sodium 135 - 145 mmol/L - 132(L) 131(L)  Potassium 3.5 - 5.1 mmol/L - 4.4 4.9  Chloride 98 - 111 mmol/L - 99 97(L)  CO2 22 - 32 mmol/L - 24 23  Calcium 8.9 - 10.3 mg/dL - 8.2(L) 8.8(L)  Total Protein 6.5 - 8.1 g/dL 7.5 7.2 8.1  Total Bilirubin 0.3 - 1.2 mg/dL 0.9 0.9 1.6(H)   Alkaline Phos 38 - 126 U/L 64 66 75  AST 15 - 41 U/L 28 26 38  ALT 0 - 44 U/L 17 17 20    CBC Latest Ref Rng & Units 03/26/2018 03/25/2018 03/19/2018  WBC 4.0 - 10.5 K/uL 7.8 10.2 11.3(H)  Hemoglobin 13.0 - 17.0 g/dL 17.3(H) 14.8 13.6  Hematocrit 39.0 - 52.0 % 52.5(H) 45.4 42.5  Platelets 150 - 400 K/uL PLATELET CLUMPS NOTED ON SMEAR, UNABLE TO ESTIMATE 180 197    RADS: CLINICAL DATA:  Right upper quadrant abdominal pain.  EXAM: NUCLEAR MEDICINE HEPATOBILIARY IMAGING WITH GALLBLADDER EF  TECHNIQUE: Sequential images of the abdomen were obtained out to 60 minutes following intravenous administration of radiopharmaceutical. After oral ingestion of Ensure, patient declined further imaging and therefore ejection fraction could not be obtained.  RADIOPHARMACEUTICALS:  5.32 mCi Tc-11m  Choletec IV  COMPARISON:  Ultrasound of March 25, 2018.  FINDINGS: Prompt uptake and biliary excretion of activity by the liver is seen. Gallbladder activity is visualized, consistent with patency of cystic duct. Biliary activity passes into small bowel, consistent with patent common bile duct.  As patient refused further imaging after Ensure administration, gallbladder ejection fraction could not be calculated.  IMPRESSION: Normal filling of gallbladder is noted. Patient declined imaging after injury administration and therefore gallbladder ejection fraction could not be calculated.   Electronically Signed   By: Marijo Conception, M.D.  On: 03/26/2018 09:41 Assessment:   HIDA scan negative for cholecystitis.  Ejection fraction unable to be calculated due to patient intolerance to complete procedure.  Nevertheless, overall clinical picture does not point to gallbladder disease as etiology in my opinion.  Recommend continue supportive care per primary, possible GI consultation for his vague abdominal pain.  Surgery will peripherally follow.  All the work-up remains negative and there is  still persistent issues with abdominal pain and nausea, can potentially consider repeat HIDA scan with ejection fraction calculation.  At this point cholecystostomy tube placement will be of minimal benefit without evidence of acute cholecystitis.  Okay to resume Eliquis as well

## 2018-03-26 NOTE — Plan of Care (Signed)
Patient has own I/O catheters. He needs assistance from the nurse.  Problem: Education: Goal: Knowledge of General Education information will improve Description Including pain rating scale, medication(s)/side effects and non-pharmacologic comfort measures Outcome: Progressing   Problem: Clinical Measurements: Goal: Ability to maintain clinical measurements within normal limits will improve Outcome: Progressing Goal: Will remain free from infection Outcome: Progressing Goal: Diagnostic test results will improve Outcome: Progressing Goal: Respiratory complications will improve Outcome: Progressing Goal: Cardiovascular complication will be avoided Outcome: Progressing   Problem: Activity: Goal: Risk for activity intolerance will decrease Outcome: Progressing   Problem: Nutrition: Goal: Adequate nutrition will be maintained Outcome: Progressing   Problem: Coping: Goal: Level of anxiety will decrease Outcome: Progressing   Problem: Pain Managment: Goal: General experience of comfort will improve Outcome: Progressing   Problem: Safety: Goal: Ability to remain free from injury will improve Outcome: Progressing   Problem: Skin Integrity: Goal: Risk for impaired skin integrity will decrease Outcome: Progressing

## 2018-03-27 LAB — DIGOXIN LEVEL: Digoxin Level: 1.3 ng/mL (ref 0.8–2.0)

## 2018-03-27 LAB — GLUCOSE, CAPILLARY
Glucose-Capillary: 95 mg/dL (ref 70–99)
Glucose-Capillary: 106 mg/dL — ABNORMAL HIGH (ref 70–99)

## 2018-03-27 NOTE — Progress Notes (Signed)
Patient refused morning medications. MD notified.   Fuller Mandril, RN

## 2018-03-27 NOTE — Progress Notes (Signed)
Pt refused 2000 hr treatments

## 2018-03-27 NOTE — Progress Notes (Signed)
Ch visited pt for prayer. Pt was in a somber mood when ch arrived. Ch conversed briefly w/ pt. Pt shared that he is in pain from being tried. Ch tried to share words of encouragement w/ pt but there was limited engagement due to his level of discomfort. Ch consulted w/ nurse who also stated pt was in pain but not physical pain. Pt has been denying most meds and breathing trt. F/u recommended.      03/27/18 2000  Clinical Encounter Type  Visited With Patient  Visit Type Initial;Psychological support;Spiritual support;Social support  Referral From Nurse  Consult/Referral To Chaplain  Spiritual Encounters  Spiritual Needs Emotional;Grief support  Stress Factors  Patient Stress Factors Health changes;Exhausted;Loss;Major life changes  Family Stress Factors None identified

## 2018-03-27 NOTE — Evaluation (Signed)
Physical Therapy Evaluation Patient Details Name: Russell Howard MRN: [id number] DOB: [birth year] Today's Date: 03/27/2018   History of Present Illness  84 yo male who is returning a week after recent discharge was referred to PT for mobility.  He had suspected acute cholelithiasis, but cleared by GI MD.  Will possibly get a cholecystostomy tube first given gallbladder disease.  Has ongoing weakness and low energy, unable to stand.  PMHx:  cardiomyopathy, CHF, COPD, fibrotic lungs, lung CA, a-fib, defibrillator  Clinical Impression  Pt was seen for a brief eval as he could not tolerate sitting for more than a short time.  He was able to return to sit but again returned to bed.  Talked with he and daughter in law about going to the SNF for rehab and have not obtained clear approval from them yet.  Will ck on the ins with SW and follow up as pt is clearly debilitated and cannot even stay up long enough to get orthostatics.  Follow him for increasing mobility as tolerated until he is ready to DC.    Follow Up Recommendations SNF    Equipment Recommendations  Rolling walker with 5" wheels    Recommendations for Other Services       Precautions / Restrictions Precautions Precautions: Fall Precaution Comments: ck orthostatics if able Restrictions Weight Bearing Restrictions: No      Mobility  Bed Mobility Overal bed mobility: Needs Assistance Bed Mobility: Supine to Sit;Sit to Supine     Supine to sit: Min assist Sit to supine: Min assist      Transfers                 General transfer comment: declined to attempt  Ambulation/Gait             General Gait Details: declined to attempt  Stairs            Wheelchair Mobility    Modified Rankin (Stroke Patients Only)       Balance Overall balance assessment: Needs assistance Sitting-balance support: Feet supported Sitting balance-Leahy Scale: Fair                                        Pertinent Vitals/Pain Pain Assessment: No/denies pain    Home Living Family/patient expects to be discharged to:: Private residence Living Arrangements: Children Available Help at Discharge: Family Type of Home: House Home Access: Ramped entrance     Home Layout: One level Home Equipment: Environmental consultant - 2 wheels;Wheelchair - Education administrator (comment) Additional Comments: daughter in law and son assist with all ADLs and iADLs.     Prior Function Level of Independence: Needs assistance   Gait / Transfers Assistance Needed: has used RW with 2L O2 at home  ADL's / Homemaking Assistance Needed: has assistance for bathing and has been assisted to do his catherization        Hand Dominance   Dominant Hand: Right    Extremity/Trunk Assessment   Upper Extremity Assessment Upper Extremity Assessment: Generalized weakness    Lower Extremity Assessment Lower Extremity Assessment: Generalized weakness    Cervical / Trunk Assessment Cervical / Trunk Assessment: Kyphotic  Communication   Communication: No difficulties  Cognition Arousal/Alertness: Lethargic Behavior During Therapy: Flat affect Overall Cognitive Status: Within Functional Limits for tasks assessed  General Comments General comments (skin integrity, edema, etc.): pt is up to side of bed with assist but without pain.  However did not feel well and could not stay there long enough to ck orthostatics    Exercises     Assessment/Plan    PT Assessment Patient needs continued PT services  PT Problem List Decreased strength;Decreased range of motion;Decreased activity tolerance;Decreased balance;Decreased mobility;Decreased coordination;Decreased knowledge of use of DME;Decreased safety awareness;Cardiopulmonary status limiting activity       PT Treatment Interventions DME instruction;Gait training;Functional mobility training;Therapeutic activities;Therapeutic  exercise;Balance training;Neuromuscular re-education;Patient/family education    PT Goals (Current goals can be found in the Care Plan section)  Acute Rehab PT Goals Patient Stated Goal: to return home PT Goal Formulation: With patient Time For Goal Achievement: 04/10/18 Potential to Achieve Goals: Fair    Frequency Min 2X/week   Barriers to discharge   has ramp to enter house    Co-evaluation               AM-PAC PT "6 Clicks" Mobility  Outcome Measure Help needed turning from your back to your side while in a flat bed without using bedrails?: A Little Help needed moving from lying on your back to sitting on the side of a flat bed without using bedrails?: A Little Help needed moving to and from a bed to a chair (including a wheelchair)?: Total Help needed standing up from a chair using your arms (e.g., wheelchair or bedside chair)?: Total Help needed to walk in hospital room?: Total Help needed climbing 3-5 steps with a railing? : Total 6 Click Score: 10    End of Session Equipment Utilized During Treatment: Oxygen Activity Tolerance: Treatment limited secondary to medical complications (Comment)(feeling poorly but NOT dizzy) Patient left: in bed;with call bell/phone within reach;with bed alarm set;with family/visitor present Nurse Communication: Mobility status PT Visit Diagnosis: Unsteadiness on feet (R26.81);Muscle weakness (generalized) (M62.81);Adult, failure to thrive (R62.7)    Time: 1155-1220 PT Time Calculation (min) (ACUTE ONLY): 25 min   Charges:   PT Evaluation $PT Eval Moderate Complexity: 1 Mod PT Treatments $Therapeutic Activity: 8-22 mins       Ramond Dial 03/27/2018, 2:05 PM  Mee Hives, PT MS Acute Rehab Dept. Number: [facility]

## 2018-03-27 NOTE — Progress Notes (Signed)
[facility] NAME: Russell Howard    MR#:  [id number]  DATE OF BIRTH:  1935-01-03  SUBJECTIVE:  CHIEF COMPLAINT:   Patient has vague complaints" I am not feeling well" He is unable to elaborate on if he is having abdominal discomfort nausea or vomiting  REVIEW OF SYSTEMS:  CONSTITUTIONAL: No fever, fatigue or weakness.  EYES: No blurred or double vision.  EARS, NOSE, AND THROAT: No tinnitus or ear pain.  RESPIRATORY: No cough, shortness of breath, wheezing or hemoptysis.  CARDIOVASCULAR: No chest pain, orthopnea, edema.  GASTROINTESTINAL: No nausea, vomiting, diarrhea or abdominal pain.  GENITOURINARY: No dysuria, hematuria.  ENDOCRINE: No polyuria, nocturia,  HEMATOLOGY: No anemia, easy bruising or bleeding SKIN: No rash or lesion. MUSCULOSKELETAL: No joint pain or arthritis.   NEUROLOGIC: No tingling, numbness, weakness.  PSYCHIATRY: No anxiety or depression.   ROS  DRUG ALLERGIES:   Allergies  Allergen Reactions  . Penicillin G Hives    Has patient had a PCN reaction causing immediate rash, facial/tongue/throat swelling, SOB or lightheadedness with hypotension: Yes Has patient had a PCN reaction causing severe rash involving mucus membranes or skin necrosis: No Has patient had a PCN reaction that required hospitalization No Has patient had a PCN reaction occurring within the last 10 years: No If all of the above answers are "NO", then may proceed with Cephalosporin use.  . Sulfa Antibiotics Rash    VITALS:  Blood pressure 129/60, pulse 86, temperature 97.8 F (36.6 C), temperature source Oral, resp. rate (!) 24, height 5\' 10"  (1.778 m), weight 72 kg, SpO2 98 %.  PHYSICAL EXAMINATION:  GENERAL:  84 y.o.-year-old patient lying in the bed with no acute distress.  EYES: Pupils equal, round, reactive to light and accommodation. No scleral icterus. Extraocular muscles intact.  HEENT: Head atraumatic, normocephalic.  Oropharynx and nasopharynx clear.  NECK:  Supple, no jugular venous distention. No thyroid enlargement, no tenderness.  LUNGS: Normal breath sounds bilaterally, no wheezing, rales,rhonchi or crepitation. No use of accessory muscles of respiration.  CARDIOVASCULAR: S1, S2 normal. No murmurs, rubs, or gallops.  ABDOMEN: Soft, nontender, nondistended. Bowel sounds present. No organomegaly or mass.  EXTREMITIES: No pedal edema, cyanosis, or clubbing.  NEUROLOGIC: Cranial nerves II through XII are intact. Muscle strength 5/5 in all extremities. Sensation intact. Gait not checked.  PSYCHIATRIC: The patient is alert and oriented x 3.  SKIN: No obvious rash, lesion, or ulcer.   Physical Exam LABORATORY PANEL:   CBC Recent Labs  Lab 03/26/18 0529  WBC 7.8  HGB 17.3*  HCT 52.5*  PLT PLATELET CLUMPS NOTED ON SMEAR, UNABLE TO ESTIMATE   ------------------------------------------------------------------------------------------------------------------  Chemistries  Recent Labs  Lab 03/26/18 0529  NA 132*  K 4.4  CL 99  CO2 24  GLUCOSE 66*  BUN 17  CREATININE 0.91  CALCIUM 8.2*  AST 28  26  ALT 17  17  ALKPHOS 64  66  BILITOT 0.9  0.9   ------------------------------------------------------------------------------------------------------------------  Cardiac Enzymes Recent Labs  Lab 03/25/18 1100  TROPONINI <0.03   ------------------------------------------------------------------------------------------------------------------  RADIOLOGY:  Nm Hepatobiliary Liver Func  Result Date: 03/26/2018 CLINICAL DATA:  Right upper quadrant abdominal pain. EXAM: NUCLEAR MEDICINE HEPATOBILIARY IMAGING WITH GALLBLADDER EF TECHNIQUE: Sequential images of the abdomen were obtained out to 60 minutes following intravenous administration of radiopharmaceutical. After oral ingestion of Ensure, patient declined further imaging and therefore ejection fraction could not be obtained.  RADIOPHARMACEUTICALS:  5.32 mCi Tc-13m  Choletec  IV COMPARISON:  Ultrasound of March 25, 2018. FINDINGS: Prompt uptake and biliary excretion of activity by the liver is seen. Gallbladder activity is visualized, consistent with patency of cystic duct. Biliary activity passes into small bowel, consistent with patent common bile duct. As patient refused further imaging after Ensure administration, gallbladder ejection fraction could not be calculated. IMPRESSION: Normal filling of gallbladder is noted. Patient declined imaging after injury administration and therefore gallbladder ejection fraction could not be calculated. Electronically Signed   By: Marijo Conception, M.D.   On: 03/26/2018 09:41    ASSESSMENT AND PLAN:  84 year old male patient with a known history of hypothyroidism, benign prostate hypertrophy, cardiomyopathy, chronic diastolic heart failure, COPD, GERD, atrial fibrillation on Eliquis for anticoagulation, lung cancer presented to the emergency room for abdominal pain of 1 day duration.  *Abdominal pain With cholelithiasis HIDA scan negative Surgery does not feel that this is acute cholecystitis Antibiotics have been discontinued Advance diet  *Chronic adult failure to thrive, malnutrition Long-term prognosis is poor   *COPD without exacerbation Stable  continue home dose inhalers  *Chronic diastolic heart failure not exacerbation  stable Continue current regiment  *History of atrial fibrillation Eliquis restarted due to no plan for surgery Continue digoxin  *Chronic hypothyroidism, unspecified Check TSH and continue Synthroid  *Chronic immobility syndrome Physical therapy to evaluate/treat  DVT prophylaxis-SCDs Long-term prognosis is poor given multiple comorbidities-palliative care consulted,   All the records are reviewed and case discussed with Care Management/Social Workerr. Management plans discussed with the patient, family and they are in  agreement.  CODE STATUS: dnr  TOTAL TIME TAKING CARE OF THIS PATIENT: 35 minutes.     POSSIBLE D/C IN 1-2 DAYS, DEPENDING ON CLINICAL CONDITION.   Dustin Flock M.D on 03/27/2018   Between 7am to 6pm - Pager - [phone]  After 6pm go to [email or web address] - password [email or web address] [facility]  Office  [phone]  CC: Primary care physician; [name], MD  Note: This dictation was prepared with Dragon dictation along with smaller phrase technology. Any transcriptional errors that result from this process are unintentional.

## 2018-03-28 LAB — GLUCOSE, CAPILLARY
Glucose-Capillary: 110 mg/dL — ABNORMAL HIGH (ref 70–99)
Glucose-Capillary: 96 mg/dL (ref 70–99)

## 2018-03-28 MED ORDER — GUAIFENESIN 100 MG/5ML PO SOLN: 5.0000 mL | ORAL | 0 refills | Status: AC | PRN

## 2018-03-28 MED ORDER — SENNOSIDES-DOCUSATE SODIUM 8.6-50 MG PO TABS: 1.0000 | ORAL_TABLET | Freq: Every evening | ORAL | 0 refills | Status: AC | PRN

## 2018-03-28 MED ORDER — IPRATROPIUM-ALBUTEROL 0.5-2.5 (3) MG/3ML IN SOLN
3.0000 mL | RESPIRATORY_TRACT | Status: DC
  Administered 2018-03-28: 3 mL via RESPIRATORY_TRACT
  Filled 2018-03-28: qty 3

## 2018-03-28 MED ORDER — IPRATROPIUM-ALBUTEROL 0.5-2.5 (3) MG/3ML IN SOLN: 3.0000 mL | RESPIRATORY_TRACT | 2 refills | Status: AC

## 2018-03-28 NOTE — Care Management Note (Signed)
Case Management Note  Patient Details  Name: Russell Howard MRN: [id number] Date of Birth: [birth year]  Subjective/Objective:  Patient to be discharged per MD order. Orders in place for home health services. Patient active with [facility] and prefers to continue using them. Patient on chronic O2 and has supplies that are needed. DME orders for nebulizer but per daughter patient has a fully functional nebulizer that he uses. No further DME needs. Notified Jermaine of pending discharge. Daughter to transport.                     Action/Plan:   Expected Discharge Date:  03/28/18               Expected Discharge Plan:  [place]  In-House Referral:     Discharge planning Services     Post Acute Care Choice:  Resumption of Svcs/PTA Provider, Home Health Choice offered to:  Patient, Adult Children  DME Arranged:    DME Agency:     HH Arranged:  RN, Disease Management, PT [place] Agency:  [place]  Status of Service:  Completed, signed off  If discussed at [place] of Stay Meetings, dates discussed:    Additional Comments:  Latanya Maudlin, RN 03/28/2018, 11:28 AM

## 2018-03-28 NOTE — Progress Notes (Signed)
Russell Howard to be D/C'd Home per MD order.  Discussed prescriptions and follow up appointments with the patient. Prescriptions given to patient, medication list explained in detail. Pt verbalized understanding.  Allergies as of 03/28/2018      Reactions   Penicillin G Hives   Has patient had a PCN reaction causing immediate rash, facial/tongue/throat swelling, SOB or lightheadedness with hypotension: Yes Has patient had a PCN reaction causing severe rash involving mucus membranes or skin necrosis: No Has patient had a PCN reaction that required hospitalization No Has patient had a PCN reaction occurring within the last 10 years: No If all of the above answers are "NO", then may proceed with Cephalosporin use.   Sulfa Antibiotics Rash      Medication List    TAKE these medications   acidophilus Caps capsule Take 1 capsule by mouth every evening.   albuterol 108 (90 Base) MCG/ACT inhaler Commonly known as:  PROVENTIL HFA;VENTOLIN HFA Inhale 2 puffs into the lungs every 6 (six) hours as needed for wheezing or shortness of breath. Notes to patient:  Last dose given 0800 03/28/2018   apixaban 5 MG Tabs tablet Commonly known as:  ELIQUIS Take 1 tablet (5 mg total) by mouth 2 (two) times daily.   aspirin EC 81 MG tablet Take 81 mg by mouth daily.   Cholecalciferol 125 MCG (5000 UT) Tabs Take 5,000 Units by mouth daily.   Co Q-10 120 MG Caps Take 120 mg by mouth every evening.   digoxin 0.25 MG tablet Commonly known as:  LANOXIN Take 0.25 mg by mouth daily.   gabapentin 300 MG capsule Commonly known as:  NEURONTIN Take 600 mg by mouth 2 (two) times daily.   guaiFENesin 100 MG/5ML Soln Commonly known as:  ROBITUSSIN Take 5 mLs (100 mg total) by mouth every 4 (four) hours as needed for cough or to loosen phlegm.   hydrocortisone 10 MG tablet Commonly known as:  CORTEF Take 10 mg by mouth 2 (two) times daily.   ipratropium-albuterol 0.5-2.5 (3) MG/3ML Soln Commonly known as:   DUONEB Take 3 mLs by nebulization every 4 (four) hours. Notes to patient:  Next dose due 4:49pm 03/28/2018   levothyroxine 100 MCG tablet Commonly known as:  SYNTHROID, LEVOTHROID Take 100 mcg by mouth daily before breakfast.   magnesium oxide 400 (241.3 Mg) MG tablet Commonly known as:  MAG-OX Take 400 mg by mouth every evening.   methenamine 1 g tablet Commonly known as:  HIPREX TAKE 1 TABLET BY MOUTH TWICE DAILY WITH MEALS   midodrine 10 MG tablet Commonly known as:  PROAMATINE Take 1 tablet (10 mg total) by mouth 3 (three) times daily as needed. If systolic BP <740 What changed:  when to take this   multivitamin with minerals Tabs tablet Take 1 tablet by mouth daily.   ondansetron 4 MG tablet Commonly known as:  ZOFRAN Take 4 mg by mouth every 8 (eight) hours as needed for nausea or vomiting.   pantoprazole 40 MG tablet Commonly known as:  PROTONIX Take 40 mg by mouth daily before breakfast.   pyridostigmine 60 MG tablet Commonly known as:  MESTINON Take 60 mg by mouth 2 (two) times daily.   saw palmetto 500 MG capsule Take 1,000 mg by mouth every evening.   senna-docusate 8.6-50 MG tablet Commonly known as:  Senokot-S Take 1 tablet by mouth at bedtime as needed for mild constipation.   simvastatin 20 MG tablet Commonly known as:  ZOCOR Take 20  mg by mouth every evening.   vitamin B-12 500 MCG tablet Commonly known as:  CYANOCOBALAMIN Take 500 mcg by mouth daily.   vitamin C 1000 MG tablet Take 500 mg by mouth 2 (two) times daily.   vitamin E 400 UNIT capsule Take 400 Units by mouth every evening.            Durable Medical Equipment  (From admission, onward)         Start     Ordered   03/28/18 0948  For home use only DME Nebulizer machine  Once    Question:  Patient needs a nebulizer to treat with the following condition  Answer:  COPD (chronic obstructive pulmonary disease) ([place])   03/28/18 0948          Vitals:   03/27/18 1923  03/28/18 0459  BP: 109/60 107/67  Pulse: 98 84  Resp: 20 20  Temp: 97.7 F (36.5 C) (!) 97.5 F (36.4 C)  SpO2: 99% 98%    Skin clean, dry and intact without evidence of skin break down, no evidence of skin tears noted. IV catheter discontinued intact. Site without signs and symptoms of complications. Dressing and pressure applied. Pt denies pain at this time. No complaints noted.  An After Visit Summary was printed and given to the patient. Patient escorted via [place], and D/C home via private auto.  Fuller Mandril, RN

## 2018-03-28 NOTE — Discharge Summary (Signed)
Russell Howard at [facility], 84 y.o., DOB 04/07/1934, MRN [id number]. 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  He underwent a limited ultrasound which showed possible cholecystitis.  As well as gallstone.  Patient was admitted for further evaluation and treatment.  He was seen in consultation by surgery underwent a HIDA scan which showed no evidence of acute cholecystitis.  Surgery felt that patient did not need any surgical intervention.  Patient continued to have vague symptoms of not feeling good.  Without pointing out to any specific area.  Patient's hemoglobin has been stable he has been afebrile.  Currently he is stable for discharge to home.  He was recommended to go to rehab however patient and family has decided to take him home.     Overall prognosis is very poor      Consults  general surgery  Significant Tests:  See full reports for all details     Nm Hepatobiliary Liver Func  Result Date: 03/26/2018 CLINICAL DATA:  Right upper quadrant abdominal pain. EXAM: NUCLEAR MEDICINE HEPATOBILIARY IMAGING WITH GALLBLADDER EF TECHNIQUE: Sequential images of the abdomen were obtained out to 60 minutes following intravenous administration of radiopharmaceutical. After oral ingestion of Ensure, patient  declined further imaging and therefore ejection fraction could not be obtained. RADIOPHARMACEUTICALS:  5.32 mCi Tc-22m  Choletec IV COMPARISON:  Ultrasound of March 25, 2018. FINDINGS: Prompt uptake and biliary excretion of activity by the liver is seen. Gallbladder activity is visualized, consistent with patency of cystic duct. Biliary activity passes into small bowel, consistent with patent common bile duct. As patient refused further imaging after Ensure administration, gallbladder ejection fraction could not be calculated. IMPRESSION: Normal filling of gallbladder is noted. Patient declined imaging after injury administration and therefore gallbladder ejection fraction could not be calculated. Electronically Signed   By: Marijo Conception, M.D.   On: 03/26/2018 09:41   Dg Chest Portable 1 View  Result Date: 03/25/2018 CLINICAL DATA:  Chest discomfort, COPD. CHF. Reported right lung cancer. EXAM: PORTABLE CHEST 1 VIEW COMPARISON:  03/19/2018 chest radiograph. FINDINGS: Stable configuration of 3 lead left subclavian ICD and cardiac valvular prosthesis. Stable cardiomediastinal silhouette with mild cardiomegaly. No pneumothorax. Stable volume loss in the right hemithorax. Stable diffuse right pleural thickening. Stable chronic coarse reticular opacities throughout the right greater than left lungs. No superimposed acute consolidative airspace disease. IMPRESSION: Stable chest with no acute superimposed process. Chronic fibrotic changes in the right greater than left lungs. Electronically Signed   By: Ilona Sorrel M.D.   On: 03/25/2018 10:55   Dg Chest Port 1 View  Result Date: 03/19/2018 CLINICAL DATA:  Cough, weakness EXAM: PORTABLE CHEST 1 VIEW COMPARISON:  02/18/2018 FINDINGS: Left AICD remains in place, unchanged. Severe diffuse right lung airspace disease and pleural thickening again noted, unchanged. Interstitial prominence throughout the left lung. Findings are stable dating back to remote studies  in early 2019. No definite acute process. IMPRESSION: Severe chronic changes  with airspace disease and pleural thickening in the right hemithorax and interstitial prominence on the left. No definite acute process. Electronically Signed   By: Rolm Baptise M.D.   On: 03/19/2018 02:03   US Abdomen Limited Ruq  Result Date: 03/25/2018 CLINICAL DATA:  Upper abdominal pain.  Nausea for few days. EXAM: ULTRASOUND ABDOMEN LIMITED RIGHT UPPER QUADRANT COMPARISON:  Body CT 05/04/2017 FINDINGS: Gallbladder: Borderline gallbladder wall thickening, measuring up to 3.3 mm. 13 mm gallbladder stone within the gallbladder neck. No sonographic Murphy sign noted by sonographer. Common bile duct: Diameter: 0.3 mm Liver: No focal lesion identified. Within normal limits in parenchymal echogenicity. Portal vein is patent on color Doppler imaging with normal direction of blood flow towards the liver. IMPRESSION: Cholelithiasis with borderline gallbladder wall thickening. This, in the appropriate clinical settings, may represent early acute cholecystitis. If further imaging evaluation is desired, HIDA scan may be considered. Electronically Signed   By: Fidela Salisbury M.D.   On: 03/25/2018 12:27       Today   Subjective:   Russell Howard patient's states that "he not feeling well" Objective:   Blood pressure 107/67, pulse 84, temperature (!) 97.5 F (36.4 C), temperature source Oral, resp. rate 20, height 5\' 10"  (1.778 m), weight 72 kg, SpO2 98 %.  .  Intake/Output Summary (Last 24 hours) at 03/28/2018 1244 Last data filed at 03/28/2018 0900 Gross per 24 hour  Intake 629.86 ml  Output 1475 ml  Net -845.14 ml    Exam VITAL SIGNS: Blood pressure 107/67, pulse 84, temperature (!) 97.5 F (36.4 C), temperature source Oral, resp. rate 20, height 5\' 10"  (1.778 m), weight 72 kg, SpO2 98 %.  GENERAL:  84 y.o.-year-old patient lying in the bed with no acute distress.  EYES: Pupils equal, round, reactive to light and  accommodation. No scleral icterus. Extraocular muscles intact.  HEENT: Head atraumatic, normocephalic. Oropharynx and nasopharynx clear.  NECK:  Supple, no jugular venous distention. No thyroid enlargement, no tenderness.  LUNGS: Normal breath sounds bilaterally, no wheezing, rales,rhonchi or crepitation. No use of accessory muscles of respiration.  CARDIOVASCULAR: S1, S2 normal. No murmurs, rubs, or gallops.  ABDOMEN: Soft, nontender, nondistended. Bowel sounds present. No organomegaly or mass.  EXTREMITIES: No pedal edema, cyanosis, or clubbing.  NEUROLOGIC: Cranial nerves II through XII are intact. Muscle strength 5/5 in all extremities. Sensation intact. Gait not checked.  PSYCHIATRIC: The patient is alert and oriented x 3.  SKIN: No obvious rash, lesion, or ulcer.   Data Review     CBC w Diff:  Lab Results  Component Value Date   WBC 7.8 03/26/2018   HGB 17.3 (H) 03/26/2018   HCT 52.5 (H) 03/26/2018   PLT PLATELET CLUMPS NOTED ON SMEAR, UNABLE TO ESTIMATE 03/26/2018   LYMPHOPCT 11 01/15/2018   MONOPCT 16 01/15/2018   EOSPCT 1 01/15/2018   BASOPCT 0 01/15/2018   CMP:  Lab Results  Component Value Date   NA 132 (L) 03/26/2018   K 4.4 03/26/2018   CL 99 03/26/2018   CO2 24 03/26/2018   BUN 17 03/26/2018   CREATININE 0.91 03/26/2018   PROT 7.2 03/26/2018   PROT 7.5 03/26/2018   ALBUMIN 3.4 (L) 03/26/2018   ALBUMIN 3.2 (L) 03/26/2018   BILITOT 0.9 03/26/2018   BILITOT 0.9 03/26/2018   ALKPHOS 66 03/26/2018   ALKPHOS 64 03/26/2018   AST 26 03/26/2018   AST 28 03/26/2018   ALT 17 03/26/2018   ALT 17 03/26/2018  .  Micro Results Recent Results (from the past 240 hour(s))  MRSA PCR Screening     Status: Abnormal   Collection Time: 03/19/18  8:26 AM  Result Value Ref Range Status   MRSA by PCR POSITIVE (A) NEGATIVE Final    Comment:        The GeneXpert MRSA Assay (FDA approved for NASAL specimens only), is one component of a comprehensive MRSA  colonization surveillance program. It is not intended to diagnose MRSA infection nor to guide or monitor treatment for MRSA infections. RESULT CALLED TO, READ BACK BY AND VERIFIED WITH:  RANDI MILLER AT 7672 03/19/18 SMA/SDR Performed at [facility], [address] [place], [place] [address]   MRSA PCR Screening     Status: Abnormal   Collection Time: 03/25/18  8:10 PM  Result Value Ref Range Status   MRSA by PCR POSITIVE (A) NEGATIVE Final    Comment:        The GeneXpert MRSA Assay (FDA approved for NASAL specimens only), is one component of a comprehensive MRSA colonization surveillance program. It is not intended to diagnose MRSA infection nor to guide or monitor treatment for MRSA infections. RESULT CALLED TO, READ BACK BY AND VERIFIED WITH: Janifer Adie 03/25/2018 2134 REC Performed at [facility], [address] [place], [place] [address]         Code Status Orders  (From admission, onward)         Start     Ordered   03/25/18 1827  Do not attempt resuscitation (DNR)  Continuous    Question Answer Comment  In the event of cardiac or respiratory ARREST Do not call a "code blue"   In the event of cardiac or respiratory ARREST Do not perform Intubation, CPR, defibrillation or ACLS   In the event of cardiac or respiratory ARREST Use medication by any route, position, wound care, and other measures to relive pain and suffering. May use oxygen, suction and manual treatment of airway obstruction as needed for comfort.   Comments Nurse may pronounce      03/25/18 1826        Code Status History    Date Active Date Inactive Code Status Order ID Comments User Context   03/19/2018 0409 03/21/2018 1337 DNR [id number]  [name], MD ED   03/19/2018 0408 03/19/2018 0409 Full Code [id number]  Salary, Avel Peace, MD ED   01/15/2018 1909 01/18/2018 1510 DNR [id number]  Loletha Grayer, MD ED   10/17/2017 2259 10/20/2017 1812 DNR [id number]  Amelia Jo, MD Inpatient   08/31/2017 2104 09/02/2017 1540 DNR [id number]  Gladstone Lighter, MD Inpatient   06/21/2017 1420 06/24/2017 1512 DNR [id number]  Dustin Flock, MD Inpatient   05/22/2017 1609 05/29/2017 1740 DNR [id number]  Gladstone Lighter, MD Inpatient   11/17/2016 1631 11/20/2016 1602 DNR [id number]  Vaughan Basta, MD Inpatient   08/19/2016 1315 08/30/2016 1728 DNR [id number]  Harrie Foreman, MD Inpatient   03/02/2016 2236 03/05/2016 2020 DNR [id number]  Mikael Spray, NP ED   01/09/2016 0658 01/14/2016 1939 DNR [id number]  Harrie Foreman, MD Inpatient   11/09/2015 0318 11/12/2015 1713 DNR [id number]  Harrie Foreman, MD Inpatient   06/14/2015 1639 06/18/2015 1513 DNR [id number]  Demetrios Loll, MD Inpatient    Advance Directive Documentation     Most Recent Value  Type of Advance Directive  Healthcare Power of Attorney, Living will  Pre-existing out of facility DNR order (yellow form or pink MOST form)  Yellow form placed in chart (order not valid for inpatient use)  "MOST" Form in Place?  -          Follow-up Information    Sparks, Leonie Douglas, MD Follow up.   Specialty:  Internal Medicine Why:  as scheduled this tues Contact information: [address] [place] [address] [phone]           Discharge Medications   Allergies as of 03/28/2018      Reactions   Penicillin G Hives   Has patient had a PCN reaction causing immediate rash, facial/tongue/throat swelling, SOB or lightheadedness with hypotension: Yes Has patient had a PCN reaction causing severe rash involving mucus membranes or skin necrosis: No Has patient had a PCN reaction that required hospitalization No Has patient had a PCN reaction occurring within the last 10 years: No If all of the above answers are "NO", then may proceed with Cephalosporin use.   Sulfa Antibiotics Rash      Medication List    TAKE these medications   acidophilus Caps capsule Take 1  capsule by mouth every evening.   albuterol 108 (90 Base) MCG/ACT inhaler Commonly known as:  PROVENTIL HFA;VENTOLIN HFA Inhale 2 puffs into the lungs every 6 (six) hours as needed for wheezing or shortness of breath. Notes to patient:  Last dose given 0800 03/28/2018   apixaban 5 MG Tabs tablet Commonly known as:  ELIQUIS Take 1 tablet (5 mg total) by mouth 2 (two) times daily.   aspirin EC 81 MG tablet Take 81 mg by mouth daily.   Cholecalciferol 125 MCG (5000 UT) Tabs Take 5,000 Units by mouth daily.   Co Q-10 120 MG Caps Take 120 mg by mouth every evening.   digoxin 0.25 MG tablet Commonly known as:  LANOXIN Take 0.25 mg by mouth daily.   gabapentin 300 MG capsule Commonly known as:  NEURONTIN Take 600 mg by mouth 2 (two) times daily.   guaiFENesin 100 MG/5ML Soln Commonly known as:  ROBITUSSIN Take 5 mLs (100 mg total) by mouth every 4 (four) hours as needed for cough or to loosen phlegm.   hydrocortisone 10 MG tablet Commonly known as:  CORTEF Take 10 mg by mouth 2 (two) times daily.   ipratropium-albuterol 0.5-2.5 (3) MG/3ML Soln Commonly known as:  DUONEB Take 3 mLs by nebulization every 4 (four) hours. Notes to patient:  Next dose due 4:49pm 03/28/2018   levothyroxine 100 MCG tablet Commonly known as:  SYNTHROID, LEVOTHROID Take 100 mcg by mouth daily before breakfast.   magnesium oxide 400 (241.3 Mg) MG tablet Commonly known as:  MAG-OX Take 400 mg by mouth every evening.   methenamine 1 g tablet Commonly known as:  HIPREX TAKE 1 TABLET BY MOUTH TWICE DAILY WITH MEALS   midodrine 10 MG tablet Commonly known as:  PROAMATINE Take 1 tablet (10 mg total) by mouth 3 (three) times daily as needed. If systolic BP <810 What changed:  when to take this   multivitamin with minerals Tabs tablet Take 1 tablet by mouth daily.   ondansetron 4 MG tablet Commonly known as:  ZOFRAN Take 4 mg by mouth every 8 (eight) hours as needed for nausea or vomiting.    pantoprazole 40 MG tablet Commonly known as:  PROTONIX Take 40 mg by mouth daily before breakfast.   pyridostigmine 60 MG tablet Commonly known as:  MESTINON Take 60 mg by mouth 2 (two) times daily.   saw palmetto  500 MG capsule Take 1,000 mg by mouth every evening.   senna-docusate 8.6-50 MG tablet Commonly known as:  Senokot-S Take 1 tablet by mouth at bedtime as needed for mild constipation.   simvastatin 20 MG tablet Commonly known as:  ZOCOR Take 20 mg by mouth every evening.   vitamin B-12 500 MCG tablet Commonly known as:  CYANOCOBALAMIN Take 500 mcg by mouth daily.   vitamin C 1000 MG tablet Take 500 mg by mouth 2 (two) times daily.   vitamin E 400 UNIT capsule Take 400 Units by mouth every evening.            Durable Medical Equipment  (From admission, onward)         Start     Ordered   03/28/18 0948  For home use only DME Nebulizer machine  Once    Question:  Patient needs a nebulizer to treat with the following condition  Answer:  COPD (chronic obstructive pulmonary disease) ([place])   03/28/18 0948             Total Time in preparing paper work, data evaluation and todays exam - 36 minutes  Dustin Flock M.D on 03/28/2018 at 12:44 PM [place]  [phone]

## 2018-04-05 ENCOUNTER — Ambulatory Visit: Payer: Medicare Other | Admitting: Pulmonary Disease

## 2018-04-29 ENCOUNTER — Ambulatory Visit: Payer: Medicare Other | Admitting: Pulmonary Disease

## 2018-09-04 DEATH — deceased

## 2019-03-01 ENCOUNTER — Ambulatory Visit: Payer: Medicare Other | Admitting: Urology

## 2019-08-30 IMAGING — CT CT ABD-PELV W/ CM
2 of 5 series · 15 of 46 positions shown, 17 images · IV contrast (APPLIED)
Comparison: 03/24/2017, 08/28/2016, 01/13/2016

CLINICAL DATA: Constipation, nausea

EXAM:
CT ABDOMEN AND PELVIS WITH CONTRAST
TECHNIQUE: Multidetector CT imaging of the abdomen and pelvis was performed
using the standard protocol following bolus administration of
intravenous contrast.
CONTRAST:  100mL [id number] IOPAMIDOL ([id number]) INJECTION 61%

[Series 2: routine abd/pel with · axial · 0.72mm/px · z∈[-1048,-568]mm · 12 of 109 slices shown, 14 images]
[im 7/109  soft-tissue]
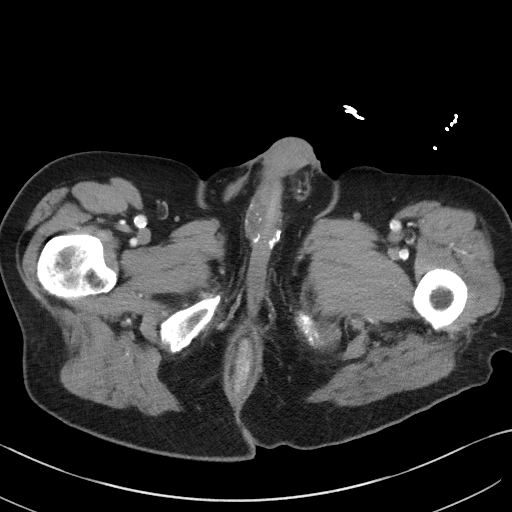
[im 7/109  bone]
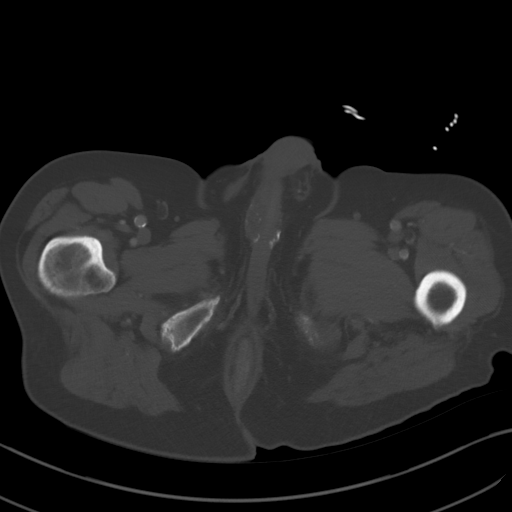
[im 19/109  soft-tissue]
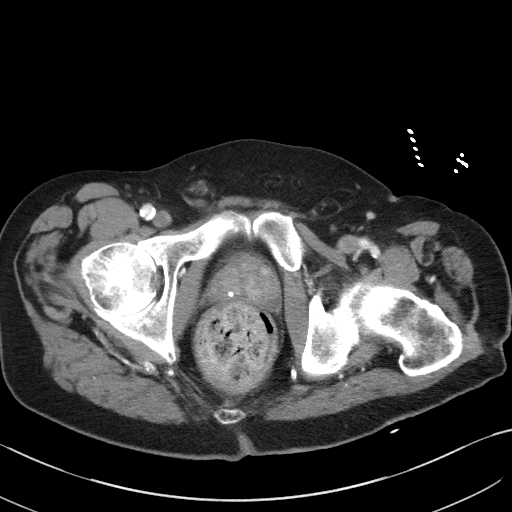
[im 25/109  soft-tissue]
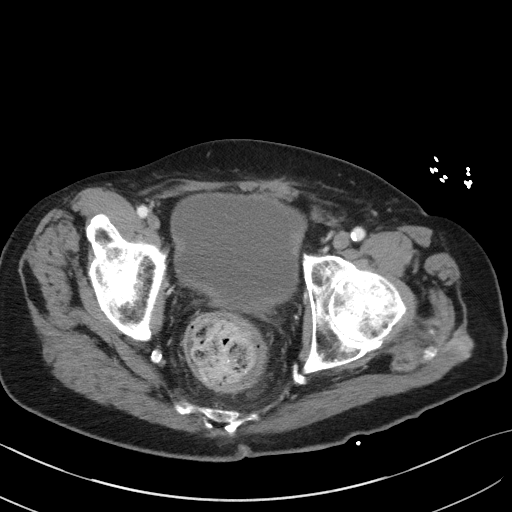
[im 31/109  soft-tissue]
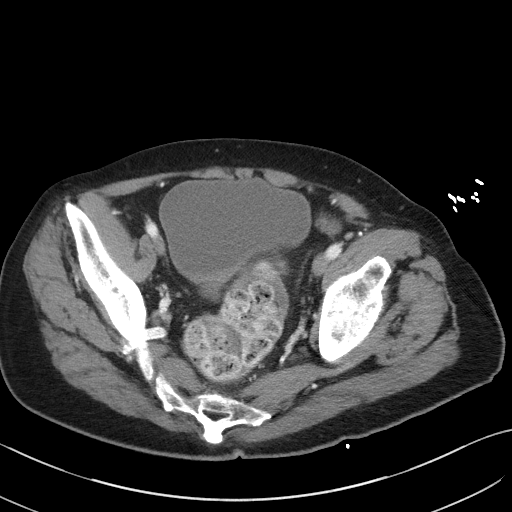
[im 43/109  soft-tissue]
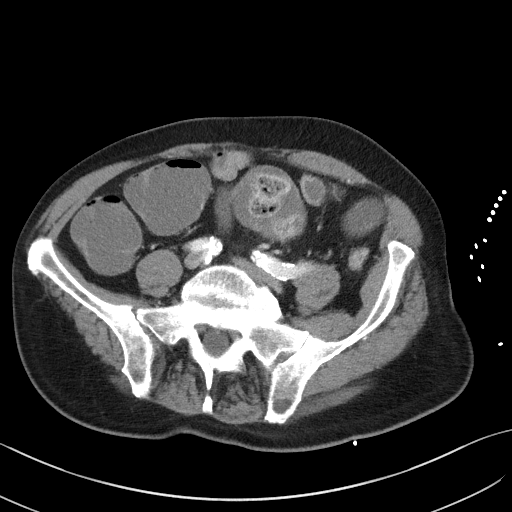
[im 49/109  soft-tissue]
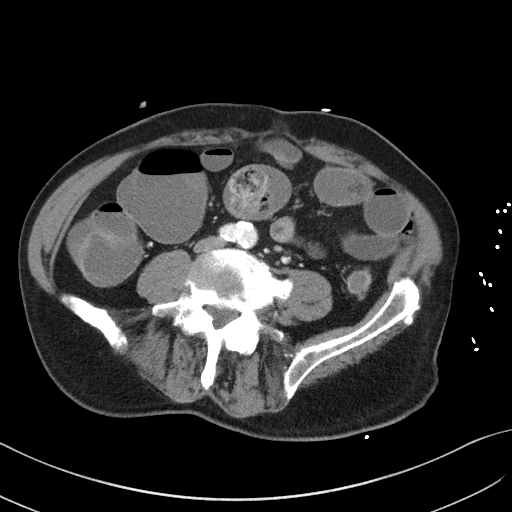
[im 61/109  soft-tissue]
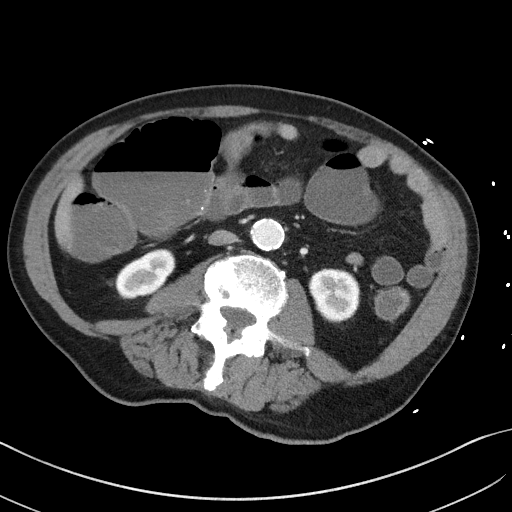
[im 67/109  soft-tissue]
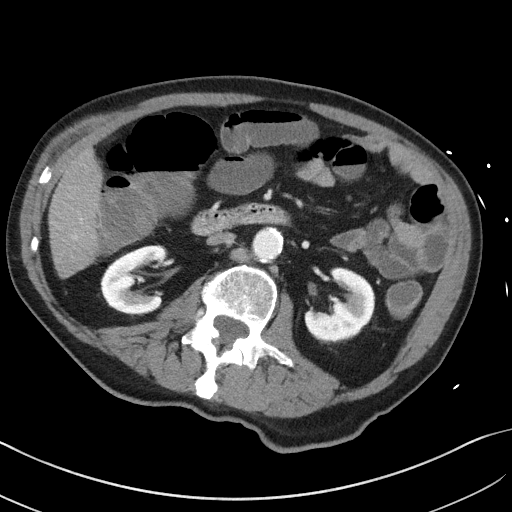
[im 79/109  soft-tissue]
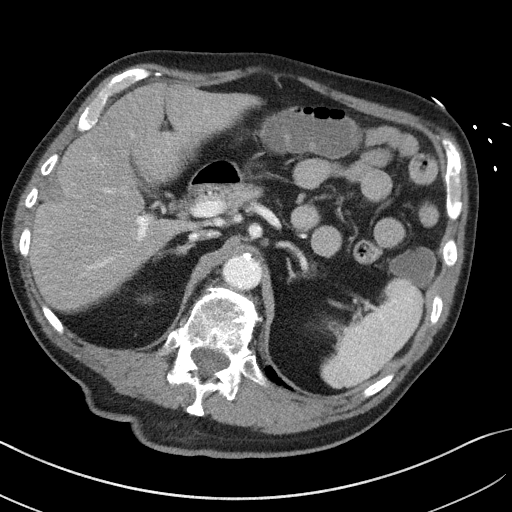
[im 79/109  bone]
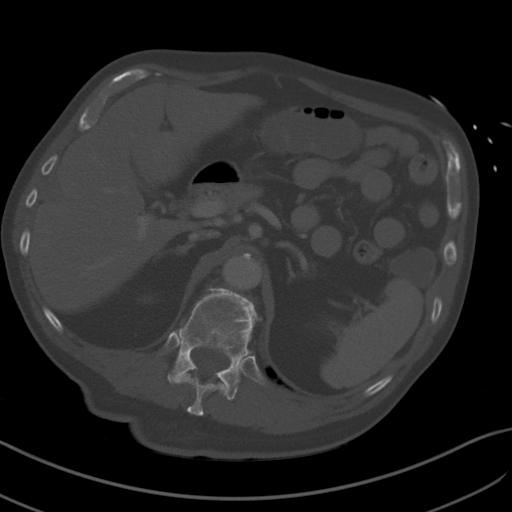
[im 85/109  soft-tissue]
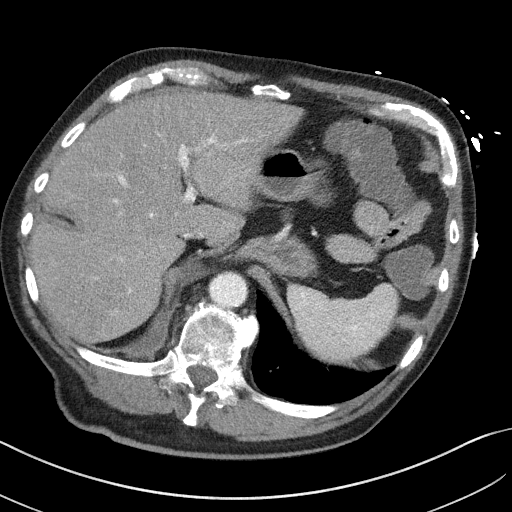
[im 91/109  soft-tissue]
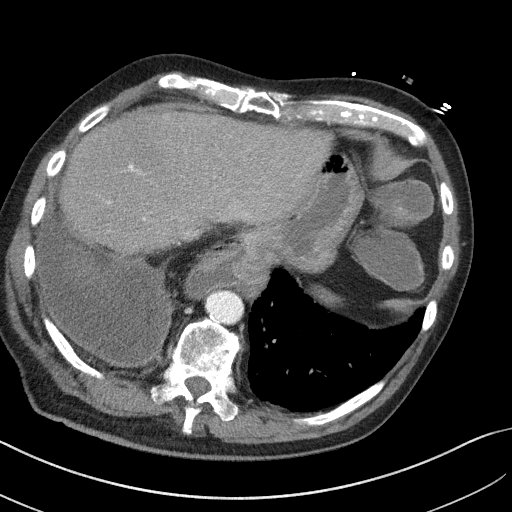
[im 103/109  soft-tissue]
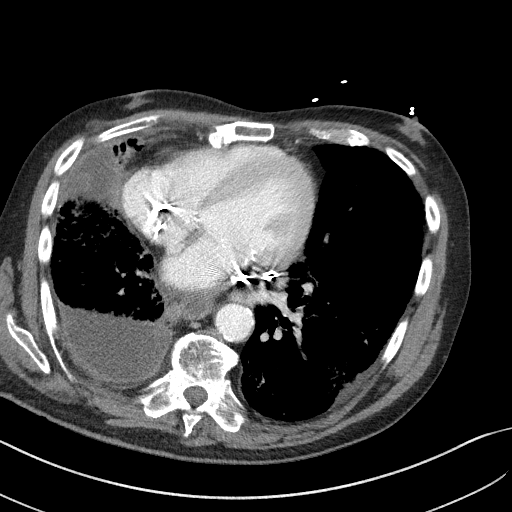

[Series 5: coronal st · coronal · 0.73mm/px · 3 of 88 slices shown]
[im 30/88  soft-tissue]
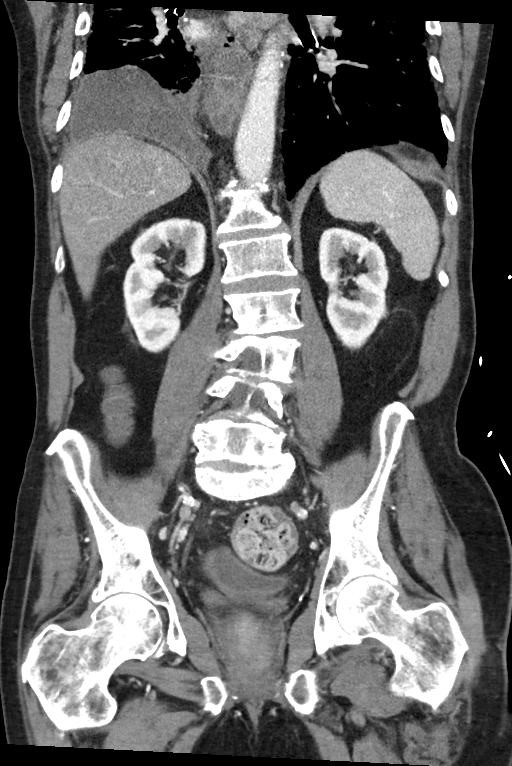
[im 39/88  soft-tissue]
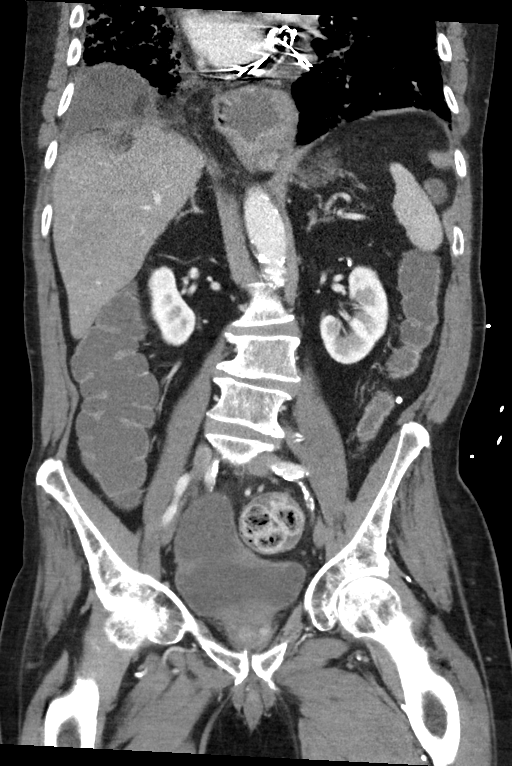
[im 49/88  soft-tissue]
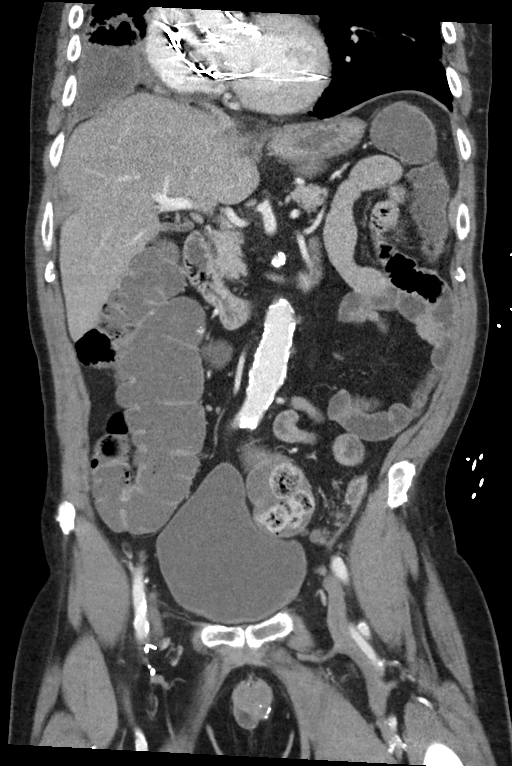

[15 of 46 positions shown; findings below may reference images not displayed]

FINDINGS: Lower chest: Chronic rim enhancing pleural effusion at the right
base. Right greater than left lung base fibrosis, similar pattern
compared to prior. Volume loss on the right with shift of
mediastinal contents to the right. Partially visualized cardiac
pacing leads. Borderline cardiomegaly. Moderate hiatal hernia.

Hepatobiliary: No focal hepatic abnormality. Calcified gallstone. No
biliary dilatation

Pancreas: Unremarkable. No pancreatic ductal dilatation or
surrounding inflammatory changes.

Spleen: Normal in size without focal abnormality.

Adrenals/Urinary Tract: Adrenal glands are within normal limits.
Kidneys show no hydronephrosis. 9 mm posterior and 7 mm anterior mid
right renal indeterminate lesions are again noted. Urinary bladder
slightly lobulated in contour but without focal wall thickening.

Stomach/Bowel: Stomach is nonenlarged. Fluid-filled distal small
bowel loops. Diffuse liquid stools throughout the colon without wall
thickening. Moderate amount of formed stools within the rectosigmoid
colon.

Vascular/Lymphatic: Moderate aortic atherosclerosis. Ectatic
infrarenal abdominal aorta up to 2.7 cm. No significantly enlarged
lymph nodes.

Reproductive: Prostate calcifications. Slightly enlarged prostate
gland. Fluid versus high-riding testicle in the right inguinal
region.

Other: Negative for free air or free fluid.

Musculoskeletal: Degenerative changes of the spine. No acute or
suspicious abnormality.
IMPRESSION: 1. Diffuse liquid stools throughout the colon but without colon wall
thickening. Moderate formed stools within the rectosigmoid colon,
suggesting constipation.
2. Chronic rim enhancing and loculated right lung base pleural
effusion. Right greater than left lung base pulmonary fibrosis
without significant interval change
3. Gallstone
4. Diffuse aortic atherosclerosis with ectatic infrarenal abdominal
aorta up to 2.7 cm. Ectatic abdominal aorta at risk for aneurysm
development. Recommend followup by ultrasound in 5 years. This
recommendation follows ACR consensus guidelines: White Paper of the
ACR Incidental Findings Committee II on Vascular Findings. [HOSPITAL] 5752; [DATE].
5. Moderate hiatal hernia
